# Patient Record
Sex: Female | Born: 1950
Health system: Southern US, Community
[De-identification: ages and names within clinical notes are randomized; demographics above are authoritative.]

## PROBLEM LIST (undated history)

## (undated) DIAGNOSIS — J449 Chronic obstructive pulmonary disease, unspecified: Secondary | ICD-10-CM

## (undated) DIAGNOSIS — E079 Disorder of thyroid, unspecified: Secondary | ICD-10-CM

## (undated) DIAGNOSIS — F419 Anxiety disorder, unspecified: Secondary | ICD-10-CM

## (undated) DIAGNOSIS — K5790 Diverticulosis of intestine, part unspecified, without perforation or abscess without bleeding: Secondary | ICD-10-CM

## (undated) DIAGNOSIS — N289 Disorder of kidney and ureter, unspecified: Secondary | ICD-10-CM

## (undated) DIAGNOSIS — E538 Deficiency of other specified B group vitamins: Secondary | ICD-10-CM

## (undated) DIAGNOSIS — A64 Unspecified sexually transmitted disease: Secondary | ICD-10-CM

## (undated) DIAGNOSIS — E039 Hypothyroidism, unspecified: Secondary | ICD-10-CM

## (undated) DIAGNOSIS — I5030 Unspecified diastolic (congestive) heart failure: Secondary | ICD-10-CM

## (undated) DIAGNOSIS — I10 Essential (primary) hypertension: Secondary | ICD-10-CM

## (undated) DIAGNOSIS — E785 Hyperlipidemia, unspecified: Secondary | ICD-10-CM

## (undated) DIAGNOSIS — N186 End stage renal disease: Secondary | ICD-10-CM

## (undated) DIAGNOSIS — I509 Heart failure, unspecified: Secondary | ICD-10-CM

## (undated) DIAGNOSIS — R011 Cardiac murmur, unspecified: Secondary | ICD-10-CM

## (undated) DIAGNOSIS — Z992 Dependence on renal dialysis: Secondary | ICD-10-CM

## (undated) DIAGNOSIS — K219 Gastro-esophageal reflux disease without esophagitis: Secondary | ICD-10-CM

## (undated) DIAGNOSIS — N891 Moderate vaginal dysplasia: Secondary | ICD-10-CM

## (undated) DIAGNOSIS — I729 Aneurysm of unspecified site: Secondary | ICD-10-CM

## (undated) DIAGNOSIS — Z9981 Dependence on supplemental oxygen: Secondary | ICD-10-CM

## (undated) DIAGNOSIS — L920 Granuloma annulare: Secondary | ICD-10-CM

## (undated) DIAGNOSIS — D649 Anemia, unspecified: Secondary | ICD-10-CM

## (undated) DIAGNOSIS — C801 Malignant (primary) neoplasm, unspecified: Secondary | ICD-10-CM

## (undated) HISTORY — DX: Gastro-esophageal reflux disease without esophagitis: K21.9

## (undated) HISTORY — DX: Essential (primary) hypertension: I10

## (undated) HISTORY — DX: Deficiency of other specified B group vitamins: E53.8

## (undated) HISTORY — DX: Disorder of thyroid, unspecified: E07.9

## (undated) HISTORY — DX: Aneurysm of unspecified site: I72.9

## (undated) HISTORY — PX: COLON SURGERY: SHX602

## (undated) HISTORY — DX: Moderate vaginal dysplasia: N89.1

## (undated) HISTORY — PX: OTHER SURGICAL HISTORY: SHX169

## (undated) HISTORY — DX: Unspecified sexually transmitted disease: A64

## (undated) HISTORY — DX: Hyperlipidemia, unspecified: E78.5

## (undated) HISTORY — PX: ABDOMINAL AORTIC ANEURYSM REPAIR: SUR1152

## (undated) HISTORY — DX: Malignant (primary) neoplasm, unspecified: C80.1

## (undated) HISTORY — PX: SKIN GRAFT: SHX250

## (undated) HISTORY — PX: TONSILLECTOMY: SUR1361

## (undated) HISTORY — DX: Granuloma annulare: L92.0

---
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
## 1984-03-30 HISTORY — PX: HYPOTHENAR FAT PAD TRANSFER: SHX6408

## 1988-03-30 HISTORY — PX: TOTAL ABDOMINAL HYSTERECTOMY: SHX209

## 1996-03-30 HISTORY — PX: BLADDER SURGERY: SHX569

## 1999-01-09 ENCOUNTER — Inpatient Hospital Stay (HOSPITAL_COMMUNITY): Admission: AD | Admit: 1999-01-09 | Discharge: 1999-01-13 | Payer: Self-pay | Admitting: *Deleted

## 1999-01-09 ENCOUNTER — Encounter (INDEPENDENT_AMBULATORY_CARE_PROVIDER_SITE_OTHER): Payer: Self-pay | Admitting: *Deleted

## 1999-06-12 ENCOUNTER — Encounter (INDEPENDENT_AMBULATORY_CARE_PROVIDER_SITE_OTHER): Payer: Self-pay | Admitting: Specialist

## 1999-06-12 ENCOUNTER — Ambulatory Visit (HOSPITAL_COMMUNITY): Admission: RE | Admit: 1999-06-12 | Discharge: 1999-06-12 | Payer: Self-pay | Admitting: Gastroenterology

## 2003-08-03 ENCOUNTER — Other Ambulatory Visit: Admission: RE | Admit: 2003-08-03 | Discharge: 2003-08-03 | Payer: Self-pay | Admitting: Family Medicine

## 2004-03-29 ENCOUNTER — Emergency Department: Payer: Self-pay | Admitting: Unknown Physician Specialty

## 2007-09-28 DIAGNOSIS — N891 Moderate vaginal dysplasia: Secondary | ICD-10-CM

## 2007-09-28 HISTORY — DX: Moderate vaginal dysplasia: N89.1

## 2007-10-05 ENCOUNTER — Ambulatory Visit: Payer: Self-pay | Admitting: Vascular Surgery

## 2008-03-30 DIAGNOSIS — L920 Granuloma annulare: Secondary | ICD-10-CM

## 2008-03-30 HISTORY — DX: Granuloma annulare: L92.0

## 2008-07-25 ENCOUNTER — Other Ambulatory Visit: Admission: RE | Admit: 2008-07-25 | Discharge: 2008-07-25 | Payer: Self-pay | Admitting: Obstetrics and Gynecology

## 2008-09-18 ENCOUNTER — Encounter: Admission: RE | Admit: 2008-09-18 | Discharge: 2008-09-18 | Payer: Self-pay | Admitting: Family Medicine

## 2009-11-14 ENCOUNTER — Encounter: Payer: Self-pay | Admitting: Family Medicine

## 2009-11-14 LAB — CONVERTED CEMR LAB: Pap Smear: NORMAL

## 2010-05-30 ENCOUNTER — Ambulatory Visit (INDEPENDENT_AMBULATORY_CARE_PROVIDER_SITE_OTHER): Payer: BC Managed Care – PPO | Admitting: Family Medicine

## 2010-05-30 ENCOUNTER — Encounter: Payer: Self-pay | Admitting: Family Medicine

## 2010-05-30 DIAGNOSIS — F17200 Nicotine dependence, unspecified, uncomplicated: Secondary | ICD-10-CM | POA: Insufficient documentation

## 2010-05-30 DIAGNOSIS — K219 Gastro-esophageal reflux disease without esophagitis: Secondary | ICD-10-CM | POA: Insufficient documentation

## 2010-05-30 DIAGNOSIS — I10 Essential (primary) hypertension: Secondary | ICD-10-CM

## 2010-05-30 DIAGNOSIS — E039 Hypothyroidism, unspecified: Secondary | ICD-10-CM | POA: Insufficient documentation

## 2010-05-30 DIAGNOSIS — E785 Hyperlipidemia, unspecified: Secondary | ICD-10-CM

## 2010-05-30 DIAGNOSIS — J449 Chronic obstructive pulmonary disease, unspecified: Secondary | ICD-10-CM | POA: Insufficient documentation

## 2010-05-30 DIAGNOSIS — I713 Abdominal aortic aneurysm, ruptured: Secondary | ICD-10-CM | POA: Insufficient documentation

## 2010-06-02 ENCOUNTER — Other Ambulatory Visit (INDEPENDENT_AMBULATORY_CARE_PROVIDER_SITE_OTHER): Payer: BC Managed Care – PPO

## 2010-06-02 ENCOUNTER — Other Ambulatory Visit: Payer: Self-pay | Admitting: Family Medicine

## 2010-06-02 ENCOUNTER — Encounter (INDEPENDENT_AMBULATORY_CARE_PROVIDER_SITE_OTHER): Payer: Self-pay | Admitting: *Deleted

## 2010-06-02 DIAGNOSIS — E785 Hyperlipidemia, unspecified: Secondary | ICD-10-CM

## 2010-06-02 DIAGNOSIS — I10 Essential (primary) hypertension: Secondary | ICD-10-CM

## 2010-06-02 DIAGNOSIS — E039 Hypothyroidism, unspecified: Secondary | ICD-10-CM

## 2010-06-02 LAB — BASIC METABOLIC PANEL
CO2: 27 mEq/L (ref 19–32)
GFR: 66.75 mL/min (ref >60.00)
Glucose, Bld: 84 mg/dL (ref 70–99)
Potassium: 4 mEq/L (ref 3.5–5.1)
Sodium: 138 mEq/L (ref 135–145)

## 2010-06-02 LAB — LIPID PANEL
Total CHOL/HDL Ratio: 5
VLDL: 32 mg/dL (ref 0.0–40.0)

## 2010-06-05 NOTE — Assessment & Plan Note (Signed)
Summary: NEW PT TO EST/CLE   [employer]   Vital Signs:  Patient profile:   60 year old female Height:      64 inches Weight:      141.75 pounds BMI:     24.42 Temp:     98.4 degrees F oral Pulse rate:   73 / minute Pulse rhythm:   regular BP sitting:   140 / 90  (right arm) Cuff size:   regular  Vitals Entered By: Sherrian Divers CMA Deborra Medina) (May 30, 2010 2:03 PM) CC: new patient, establish care   History of Present Illness: 60 yo here to establish care with.  GERD-followed by Dr. Collene Mares.  aware that her cigarette smoking is making it worse.  symtpoms are the worst when she lays down at night.  tums helps a little bit, never tried anything stronger.  no nausea, vomiting, abdominal pain or changes in her stool.  HLD- Had been well controlled on Vytorin but could not afford it.  Has been on Lipitor 40 mg daily for approx 60 days.  Also taking Tricor 160 mg daily.   Has not had labs rechecked since Lipitor was added.  HTN- has been well controlled on Lotrel 5-10 mg and Meprolol 25 mg daily.  BP at last OV at previous MD was 128/70.  Pt states she was nervous about today's visit.  No CP, SOB or LE edema.  Hypothyroidism- was overcorrected and Synthroid was decreased to 100 micrograms in 01/2010.  Has not had follow up labs ince.  Tobacco abuse- 40 pack year history.  Tried Chantix in past, just was not ready.  Not currently ready to quit.  Has a h/o AAA and knows that she needs to quit.  Preventive Screening-Counseling & Management  Alcohol-Tobacco     Smoking Status: current      Drug Use:  no.    Current Medications (verified): 1)  Levothroid 100 Mcg Tabs (Levothyroxine Sodium) .... Take One Tablet By Mouth Daily 2)  Amlodipine Besy-Benazepril Hcl 5-10 Mg Caps (Amlodipine Besy-Benazepril Hcl) .... Take One Tablet By Mouth Daily 3)  Vitamin D 1000 Unit Tabs (Cholecalciferol) .... Take Four Tablets By Mouth Daily 4)  Metoprolol Tartrate 25 Mg Tabs (Metoprolol Tartrate) .... Take One  Tablet By Mouth Daily 5)  Fenofibrate 160 Mg Tabs (Fenofibrate) .... Take One Tablet By Mouth Daily 6)  Atorvastatin Calcium 40 Mg Tabs (Atorvastatin Calcium) .... Take One Tablet By Mouth Daily 7)  Estrace 0.1 Mg/gm Crea (Estradiol) .... 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 Joan Flores Cardiology: PVD managed by Dr. Rollene Fare, Dr. Kellie Simmering Gastroenterology: Dr. 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 Eyes:  Denies blurring. ENT:  Denies difficulty swallowing. CV:  Denies chest pain or discomfort. Resp:  Denies shortness of breath. GI:  Complains of indigestion; denies abdominal pain and  change in bowel habits. GU:  Denies dysuria. MS:  Denies joint pain, joint redness, and joint swelling. Derm:  Denies rash. Neuro:  Denies headaches. Psych:  Denies anxiety and depression. Endo:  Denies cold intolerance and heat intolerance. Heme:  Denies abnormal bruising and bleeding.  Physical Exam  General:  alert, well-developed, and well-hydrated.   Head:  Normocephalic and atraumatic without obvious abnormalities. No apparent alopecia or balding. Eyes:  No corneal or conjunctival inflammation noted. EOMI. Perrla. Funduscopic exam  benign, without hemorrhages, exudates or papilledema. Vision grossly normal. Ears:  External ear exam shows no significant lesions or deformities.  Otoscopic examination reveals clear canals, tympanic membranes are intact bilaterally without bulging, retraction, inflammation or discharge. Hearing is grossly normal bilaterally. Nose:  External nasal examination shows no deformity or inflammation. Nasal mucosa are pink and moist without lesions or exudates. Mouth:  Oral mucosa and oropharynx without lesions or exudates.  Teeth in good repair. Neck:  No deformities, masses, or tenderness noted. Lungs:  Normal respiratory effort, chest expands symmetrically. Lungs are clear to auscultation, no crackles or wheezes. Heart:  Normal rate and regular rhythm. S1 and S2 normal without gallop, murmur, click, rub or other extra sounds. Abdomen:  Bowel sounds positive,abdomen soft and non-tender without masses, organomegaly or hernias noted. Msk:  No deformity or scoliosis noted of thoracic or lumbar spine.   Extremities:  no edema Neurologic:  alert & oriented X3 and gait normal.   Skin:  Intact without suspicious lesions or rashes Cervical Nodes:  No lymphadenopathy noted Psych:  Cognition and judgment appear intact. Alert and cooperative with normal attention span and concentration. No apparent delusions, illusions, hallucinations   Impression & Recommendations:  Problem # 1:  HYPOTHYROIDISM (ICD-244.9) Assessment Unchanged recheck TSH,  Continue current dose of Levothroid for now, asymptomatic. Her updated medication list for this problem includes:    Levothroid 100 Mcg Tabs (Levothyroxine sodium) .Marland Kitchen... Take one tablet by mouth daily  Problem # 2:  GERD (ICD-530.81) Assessment: Deteriorated advised trying Prilosec 20 mg OTC. Pt will update me or Dr. Collene Mares with her symptoms in 1-2 months, sooner if symtpoms progress.  Problem # 3:  HYPERTENSION (ICD-401.9) Assessment: Deteriorated likely white  coat HTN today.  continue current medications and we will continue to monitor. Her updated medication list for this problem includes:    Amlodipine Besy-benazepril Hcl 5-10 Mg Caps (Amlodipine besy-benazepril hcl) .Marland Kitchen... Take one tablet by mouth daily    Metoprolol Tartrate 25 Mg Tabs (Metoprolol tartrate) .Marland Kitchen... Take one tablet by mouth daily  Problem # 4:  HYPERLIPIDEMIA (ICD-272.4) Assessment: Unchanged recently started lipitor (60 days ago), will recheck FLP, hepatic panel when she is fasting. Her updated medication list for this problem includes:    Fenofibrate 160 Mg Tabs (Fenofibrate) .Marland Kitchen... Take one tablet by mouth daily    Atorvastatin Calcium 40 Mg Tabs (Atorvastatin calcium) .Marland Kitchen... Take one tablet by mouth daily  Problem # 5:  TOBACCO ABUSE (ICD-305.1) Assessment: Unchanged Discussed importance of smoking cessation but pt is not yet ready to quit.  She will let me know when she is.  At that point, she would like to try Chantix again.  Complete Medication List: 1)  Levothroid 100 Mcg Tabs (Levothyroxine sodium) .... Take one tablet by mouth daily 2)  Amlodipine Besy-benazepril Hcl 5-10 Mg Caps (Amlodipine besy-benazepril hcl) .... Take one tablet by mouth daily 3)  Vitamin D 1000 Unit Tabs (Cholecalciferol) .... Take four tablets by mouth daily 4)  Metoprolol Tartrate 25 Mg  Tabs (Metoprolol tartrate) .... Take one tablet by mouth daily 5)  Fenofibrate 160 Mg Tabs (Fenofibrate) .... Take one tablet by mouth daily 6)  Atorvastatin Calcium 40 Mg Tabs (Atorvastatin calcium) .... Take one tablet by mouth daily 7)  Estrace 0.1 Mg/gm Crea (Estradiol) .... Apply twice weekly  Patient Instructions: 1)  Great to meet you. 2)  Please make an appointment for a fasting lab visit for lipid panel, BMET, TSH, liver function- 272.4, 401.9 Prescriptions: LEVOTHROID 100 MCG TABS (LEVOTHYROXINE SODIUM) take one tablet by mouth daily  #90 x 3   Entered and Authorized by:   Arnette Norris MD   Signed by:    Arnette Norris MD on 05/30/2010   Method used:   Electronically to        [facility]. [phone]* (retail)       [place], [place]  [id number]       Ph: [phone] or [phone]       Fax: [phone]   RxID:   [id number]    Orders Added: 1)  New Patient Level III [phone]    Current Allergies (reviewed today): No known allergies   TD Result Date:  09/06/2008 TD Result:  given TD Next Due:  75 yr Flex Sig Next Due:  Not Indicated Colonoscopy Result Date:  11/13/2009 Colonoscopy Result:  normal Hemoccult Next Due:  Not Indicated PAP Result Date:  11/14/2009 PAP Result:  historical-normal Mammogram Result Date:  11/19/2009 Mammogram Result:  normal    Past Medical History:    Hyperlipidemia    Hypertension    Hypothyroidism    GERD  Past Surgical History:    Hysterectomy    Tonsillectomy

## 2010-06-10 NOTE — Letter (Signed)
Summary: Patient medical records  Patient medical records   Imported By: Jamelle Haring 06/05/2010 11:35:11  _____________________________________________________________________  External Attachment:    Type:   Image     Comment:   External Document

## 2010-07-08 ENCOUNTER — Encounter: Payer: Self-pay | Admitting: Family Medicine

## 2010-07-08 LAB — HM MAMMOGRAPHY

## 2010-07-08 LAB — HM PAP SMEAR

## 2010-07-10 ENCOUNTER — Other Ambulatory Visit: Payer: Self-pay | Admitting: Family Medicine

## 2010-07-10 ENCOUNTER — Encounter: Payer: Self-pay | Admitting: Family Medicine

## 2010-07-10 ENCOUNTER — Ambulatory Visit (INDEPENDENT_AMBULATORY_CARE_PROVIDER_SITE_OTHER): Payer: BC Managed Care – PPO | Admitting: Family Medicine

## 2010-07-10 VITALS — BP 132/80 | HR 60 | Temp 98.0°F | Ht 64.0 in | Wt 139.4 lb

## 2010-07-10 DIAGNOSIS — K219 Gastro-esophageal reflux disease without esophagitis: Secondary | ICD-10-CM

## 2010-07-10 MED ORDER — DEXLANSOPRAZOLE 60 MG PO CPDR: 60.0000 mg | DELAYED_RELEASE_CAPSULE | Freq: Every day | ORAL | Status: DC

## 2010-07-10 NOTE — Patient Instructions (Signed)
Gastroesophageal Reflux Disease (GERD) Your caregiver has diagnosed your chest discomfort as caused by gastroesophageal reflux disease (GERD). GERD is caused by a reflux of acid from your stomach into the digestive tube between your mouth and stomach (esophagus). Acid in contact with the esophagus causes soreness (inflammation) resulting in heartburn or chest pain. It may cause small holes in the lining of the esophagus (ulcers). CAUSES  Increased body weight puts pressure on the stomach, making acid rise.   Smoking increases acid production.   Alcohol decreases pressure on the valve between the stomach and esophagus (lower esophageal sphincter), allowing acid from the stomach into the esophagus.   Late evening meals and a full stomach increase pressure and acid production.   Lower esophageal sphincter is malformed.   Sometimes, no reason is found.  HOME CARE INSTRUCTIONS  Change the factors that you can control. Weight, smoking, or alcohol changes may be difficult to change on your own. Your caregiver can provide guidance and medical therapy.   Raising the head of your bed may help you to sleep.   Over-the-counter medicines will decrease acid production. Your caregiver can also prescribe medicines for this. Only take over-the-counter or prescription medicines for pain, discomfort, or fever as directed by your caregiver.   1/2 to 1 teaspoon of an antacid taken every hour while awake, with meals, and at bedtime, can neutralize acid.   DO NOT take aspirin, ibuprofen, or other nonsteroidal anti-inflammatory drugs (NSAIDs).  SEEK IMMEDIATE MEDICAL CARE IF:  The pain changes in location (radiates into arms, neck, jaw, teeth, or back), intensity, or duration.   You start feeling sick to your stomach (nauseous), start throwing up (vomiting), or sweating (diaphoresis).   You develop left arm or jaw pain.   You develop pain going into your back, shortness of breath, or you pass out.    There is vomiting of fluid that is green, yellow, or looks like coffee grounds or blood.  These symptoms could signal other problems, such as heart disease. MAKE SURE YOU:  Understand these instructions.   Will watch your condition.   Will get help right away if you are not doing well or get worse.  Document Released: 12/24/2004 Document Re-Released: 06/10/2009 [facility] Patient Information 2011 [place].

## 2010-07-10 NOTE — Progress Notes (Signed)
60 yo here for follow up GERD.  GERD-followed by Dr. Collene Mares.  aware that her cigarette smoking is making it worse.  symtpoms are the worst when she lays down at night.  tums helps a little bit, never tried anything stronger.  no nausea, vomiting, abdominal pain or changes in her stool.  Added Prilosec 20 mg daily last month which did not help much so she started taking it twice a day with a little more improvement of symptoms.  Does not have sensation that food or liquids are getting stuck.  The PMH, PSH, Social History, Family History, Medications, and allergies have been reviewed in [facility], and have been updated if relevant.   Review of Systems       See HPI General:  Denies fatigue and fever. Eyes:  Denies blurring. ENT:  Denies difficulty swallowing. CV:  Denies chest pain or discomfort. Resp:  Denies shortness of breath. GI:  Complains of indigestion; denies abdominal pain and change in bowel habits. GU:  Denies dysuria. MS:  Denies joint pain, joint redness, and joint swelling. Derm:  Denies rash. Neuro:  Denies headaches. Psych:  Denies anxiety and depression. Endo:  Denies cold intolerance and heat intolerance. Heme:  Denies abnormal bruising and bleeding.  Physical Exam BP 132/80  Pulse 60  Temp(Src) 98 F (36.7 C) (Oral)  Ht 5\' 4"  (1.626 m)  Wt 139 lb 6.4 oz (63.231 kg)  BMI 23.93 kg/m2  General:  alert, well-developed, and well-hydrated.   Head:  Normocephalic and atraumatic without obvious abnormalities. No apparent alopecia or balding. Eyes:  No corneal or conjunctival inflammation noted. EOMI. Perrla. Funduscopic exam benign, without hemorrhages, exudates or papilledema. Vision grossly normal. Ears:  External ear exam shows no significant lesions or deformities.  Otoscopic examination reveals clear canals, tympanic membranes are intact bilaterally without bulging, retraction, inflammation or discharge. Hearing is grossly normal bilaterally. Nose:  External nasal  examination shows no deformity or inflammation. Nasal mucosa are pink and moist without lesions or exudates. Mouth:  Oral mucosa and oropharynx without lesions or exudates.  Teeth in good repair. Neck:  No deformities, masses, or tenderness noted. Lungs:  Normal respiratory effort, chest expands symmetrically. Lungs are clear to auscultation, no crackles or wheezes. Heart:  Normal rate and regular rhythm. S1 and S2 normal without gallop, murmur, click, rub or other extra sounds. Abdomen:  Bowel sounds positive,abdomen soft and non-tender without masses, organomegaly or hernias noted. Msk:  No deformity or scoliosis noted of thoracic or lumbar spine.   Extremities:  no edema Neurologic:  alert & oriented X3 and gait normal.   Skin:  Intact without suspicious lesions or rashes Cervical Nodes:  No lymphadenopathy noted Psych:  Cognition and judgment appear intact. Alert and cooperative with normal attention span and concentration. No apparent delusions, illusions, hallucinations

## 2010-07-10 NOTE — Assessment & Plan Note (Signed)
Deteriorated. Given samples of Dexilant to try also advised to make appointment with Dr. Collene Mares for evaluation/possible endoscopy given smoking history. No red flag symptoms currently.

## 2010-07-16 ENCOUNTER — Other Ambulatory Visit: Payer: Self-pay | Admitting: *Deleted

## 2010-07-16 MED ORDER — FENOFIBRATE 160 MG PO TABS: 160.0000 mg | ORAL_TABLET | Freq: Every day | ORAL | Status: DC

## 2010-07-29 ENCOUNTER — Other Ambulatory Visit (INDEPENDENT_AMBULATORY_CARE_PROVIDER_SITE_OTHER): Payer: BC Managed Care – PPO | Admitting: Family Medicine

## 2010-07-29 ENCOUNTER — Other Ambulatory Visit: Payer: Self-pay | Admitting: Family Medicine

## 2010-07-29 DIAGNOSIS — I10 Essential (primary) hypertension: Secondary | ICD-10-CM

## 2010-07-29 DIAGNOSIS — E785 Hyperlipidemia, unspecified: Secondary | ICD-10-CM

## 2010-07-29 DIAGNOSIS — E039 Hypothyroidism, unspecified: Secondary | ICD-10-CM

## 2010-07-29 LAB — HEPATIC FUNCTION PANEL
AST: 25 U/L (ref 0–37)
Albumin: 4 g/dL (ref 3.5–5.2)
Alkaline Phosphatase: 43 U/L (ref 39–117)
Bilirubin, Direct: 0.2 mg/dL (ref 0.0–0.3)
Total Bilirubin: 0.9 mg/dL (ref 0.3–1.2)

## 2010-07-29 LAB — TSH: TSH: 3.27 u[IU]/mL (ref 0.35–5.50)

## 2010-07-29 LAB — BASIC METABOLIC PANEL
BUN: 15 mg/dL (ref 6–23)
Chloride: 107 mEq/L (ref 96–112)
GFR: 56.56 mL/min — ABNORMAL LOW (ref >60.00)
Potassium: 4.9 mEq/L (ref 3.5–5.1)
Sodium: 143 mEq/L (ref 135–145)

## 2010-07-29 LAB — LIPID PANEL
Cholesterol: 178 mg/dL (ref 0–200)
LDL Cholesterol: 111 mg/dL — ABNORMAL HIGH (ref 0–99)

## 2010-08-11 ENCOUNTER — Other Ambulatory Visit: Payer: Self-pay | Admitting: Family Medicine

## 2010-08-12 NOTE — Procedures (Signed)
LOWER EXTREMITY ARTERIAL EVALUATION-SINGLE LEVEL   INDICATION:  Followup of known peripheral vascular disease.  The patient  denies claudication and rest pain bilaterally.   HISTORY:  Diabetes:  No.  Cardiac:  No.  Hypertension:  Yes.  Smoking:  Yes, <1 pack per day.  Previous Surgery:  Left lower extremity arterial surgery in 1987.   RESTING SYSTOLIC PRESSURES: (ABI)                          RIGHT                LEFT  Brachial:               130                  136  Anterior tibial:        120                  124  Posterior tibial:       129 (0.95)           128 (0.94)  Peroneal:  DOPPLER WAVEFORM ANALYSIS:  Anterior tibial:        Biphasic             Biphasic  Posterior tibial:       Biphasic             Biphasic  Peroneal:   PREVIOUS ABI'S:  Date: 09/16/2004  RIGHT:  0.84  LEFT:  0.84   IMPRESSION:  1. Mild increase in ABIs bilaterally.  2. Normal right ABI.  3. Near normal left ABI.  4. Preliminary report faxed to Dr. Vincente Poli office at 11:30 on      10/05/2007.      ___________________________________________  Rosetta Posner, M.D.   PB/MEDQ  D:  10/05/2007  T:  10/05/2007  Job:  [id number]

## 2010-08-15 NOTE — Procedures (Signed)
[place]  Patient:    [name], [name]                    MRN: [id number] Proc. Date: 06/12/99 Adm. Date:  [id number] Attending:  Juanita Craver CC:         Sharene Butters, M.D.                           Procedure Report  DATE OF BIRTH:  1950-09-10.  REFERRING PHYSICIAN:  Sharene Butters, M.D.  PROCEDURE PERFORMED:  Flexible sigmoidoscopy.  ENDOSCOPIST:  Nelwyn Salisbury, M.D.  INSTRUMENT USED:  Olympus video colonoscope.  INDICATION FOR PROCEDURE:  Screening flexible sigmoidoscopy is being performed n a 60 year old white female; rule out masses, polyps, etc.  PREPROCEDURE PREPARATION:  Informed consent was procured from the patient.  The  patient was fasted for eight hours prior to the procedure and prepped with two [employer] the morning of the procedure.  PREPROCEDURE PHYSICAL:  VITAL SIGNS:  Patient had stable vital signs.  NECK:  Supple.  CHEST:  Clear to auscultation.  S1 and S2 regular.  ABDOMEN:  Soft with normal abdominal bowel sounds.  DESCRIPTION OF PROCEDURE:  The patient was placed in the left lateral decubitus  position.  No sedation was used.  Once the patient was adequately positioned, Olympus video colonoscope was advanced from the rectum to 50 cm with slight difficulty secondary to a large amount of solid stool at 50 cm.  There was early left-sided diverticular disease with some inspissated stool in several of the diverticular pockets.  There was a patchy area of erythema measuring about 2 to 3 mm at 50 cm, of unclear etiology; this was biopsied for pathology.  Small internal and external hemorrhoids were seen.  The patient tolerated the procedure well without complication.  No masses or polyps were present.  IMPRESSION: 1. Early left-sided diverticular disease with stool in some of the diverticular    pockets. 2. Patchy area of erythema at 50 cm, 2 to 3 mm in size, biopsied for pathology.  3.  Small internal and external hemorrhoids.  RECOMMENDATIONS: 1. Await pathology results. 2. Increase fluid and fiber in the diet. 3. Outpatient followup in the next 7 to 10 days. DD:  06/12/99 TD:  06/13/99 Job: [id number]

## 2010-08-21 ENCOUNTER — Ambulatory Visit (INDEPENDENT_AMBULATORY_CARE_PROVIDER_SITE_OTHER): Payer: BC Managed Care – PPO | Admitting: Family Medicine

## 2010-08-21 ENCOUNTER — Encounter: Payer: Self-pay | Admitting: Family Medicine

## 2010-08-21 DIAGNOSIS — M5430 Sciatica, unspecified side: Secondary | ICD-10-CM

## 2010-08-21 DIAGNOSIS — M5440 Lumbago with sciatica, unspecified side: Secondary | ICD-10-CM

## 2010-08-21 MED ORDER — CYCLOBENZAPRINE HCL 10 MG PO TABS: 10.0000 mg | ORAL_TABLET | Freq: Three times a day (TID) | ORAL | Status: AC | PRN

## 2010-08-21 NOTE — Progress Notes (Signed)
  Subjective:    Patient ID: [name], female    DOB: 03-Nov-1950, 60 y.o.   MRN: [id number]  HPI Pt of Dr Hulen Shouts here as acute appt for leg pain yesterday worse yesterday and so bad  that she would have cut off her leg then if it would have been possible. Sat she was washing her truck and turned the wrong way and felt a shooting pain  in the low back. The next day she had shooting pain down the left leg and yesterday it was the worst it had been. She has had LBP before but never down the leg. She has had no swelling or erythema but had been concerned about a clot. She has been able to walk without difficulty and slept last nite although she had to often get up to walk around to help with the discomfort. She has not taken any medication. She did ice the area some.     Review of SystemsNoncontributory except as above.       Objective:   Physical ExamWDWN WF NAD  Changing position regularly and "sitting on one cheek" when sitting. Flexion at the waist to the floor w/o diff or pain. Ext nml. Land R lat bend nml and w/o pain. Twist causes slight twinge at approx 30 degrees bilat. DTRs nml patellar. SLR nml, no discomfort.        Assessment & Plan:

## 2010-08-21 NOTE — Assessment & Plan Note (Signed)
Discussed pathophysiology and trmt. See instructions. Call or RTC if sxs don't slowly start to improve.

## 2010-08-21 NOTE — Patient Instructions (Signed)
Take IBP 800mg  (4x200mg ) after btrfst lunch and supoper. Take FLexeril 10mg  with each dose of IBP. Use heat and ice as discussed. Start gentle stretches next week.

## 2010-08-29 ENCOUNTER — Other Ambulatory Visit: Payer: Self-pay | Admitting: Family Medicine

## 2010-09-28 ENCOUNTER — Other Ambulatory Visit: Payer: Self-pay | Admitting: Family Medicine

## 2010-09-30 ENCOUNTER — Telehealth: Payer: Self-pay | Admitting: *Deleted

## 2010-09-30 MED ORDER — OMEPRAZOLE 40 MG PO CPDR: 40.0000 mg | DELAYED_RELEASE_CAPSULE | Freq: Every day | ORAL | Status: DC

## 2010-09-30 NOTE — Telephone Encounter (Signed)
Bridgett notified as instructed via telephone, Rx Omeprazole sent electronically.

## 2010-09-30 NOTE — Telephone Encounter (Signed)
Generic omeprazole sent to her pharmacy.

## 2010-09-30 NOTE — Telephone Encounter (Signed)
Bridgett from [facility] called to let you know that the newium is going to be $190.00 w/ patients insurance and is asking if there is something else that she can try. Please advise.

## 2010-12-29 ENCOUNTER — Other Ambulatory Visit: Payer: Self-pay | Admitting: *Deleted

## 2010-12-29 MED ORDER — LEVOTHYROXINE SODIUM 75 MCG PO TABS: 75.0000 ug | ORAL_TABLET | Freq: Every day | ORAL | Status: DC

## 2010-12-29 MED ORDER — METOPROLOL TARTRATE 25 MG PO TABS: 25.0000 mg | ORAL_TABLET | Freq: Every day | ORAL | Status: DC

## 2011-02-05 ENCOUNTER — Other Ambulatory Visit: Payer: Self-pay | Admitting: Family Medicine

## 2011-02-06 NOTE — Telephone Encounter (Signed)
Rx refilled electronically °

## 2011-03-08 ENCOUNTER — Other Ambulatory Visit: Payer: Self-pay | Admitting: Family Medicine

## 2011-03-27 ENCOUNTER — Other Ambulatory Visit: Payer: Self-pay | Admitting: Family Medicine

## 2011-04-07 ENCOUNTER — Encounter: Payer: Self-pay | Admitting: Family Medicine

## 2011-04-08 ENCOUNTER — Other Ambulatory Visit: Payer: Self-pay | Admitting: Family Medicine

## 2011-04-08 DIAGNOSIS — R928 Other abnormal and inconclusive findings on diagnostic imaging of breast: Secondary | ICD-10-CM

## 2011-07-17 ENCOUNTER — Other Ambulatory Visit: Payer: Self-pay | Admitting: Family Medicine

## 2011-07-17 MED ORDER — LEVOTHYROXINE SODIUM 75 MCG PO TABS: 75.0000 ug | ORAL_TABLET | Freq: Every day | ORAL | Status: DC

## 2011-08-07 ENCOUNTER — Other Ambulatory Visit: Payer: Self-pay | Admitting: *Deleted

## 2011-08-07 MED ORDER — ATORVASTATIN CALCIUM 40 MG PO TABS: 40.0000 mg | ORAL_TABLET | Freq: Every day | ORAL | Status: DC

## 2011-08-07 MED ORDER — FENOFIBRATE 160 MG PO TABS: 160.0000 mg | ORAL_TABLET | Freq: Every day | ORAL | Status: DC

## 2011-08-07 MED ORDER — LEVOTHYROXINE SODIUM 75 MCG PO TABS: 75.0000 ug | ORAL_TABLET | Freq: Every day | ORAL | Status: DC

## 2011-08-07 MED ORDER — AMLODIPINE BESY-BENAZEPRIL HCL 5-10 MG PO CAPS: 1.0000 | ORAL_CAPSULE | Freq: Every day | ORAL | Status: DC

## 2011-08-07 MED ORDER — METOPROLOL TARTRATE 25 MG PO TABS: 25.0000 mg | ORAL_TABLET | Freq: Two times a day (BID) | ORAL | Status: DC

## 2011-08-07 NOTE — Telephone Encounter (Signed)
Ok to refill one month only of each.  Please call pt to make sure she schedules a physical exam. Thank you.

## 2011-08-07 NOTE — Telephone Encounter (Signed)
Patient has not been in since May 2012.  Has no appt scheduled.  A note was sent to pharmacy at last refill request of 2 medications.  Please advise.

## 2011-08-25 ENCOUNTER — Other Ambulatory Visit: Payer: Self-pay | Admitting: Family Medicine

## 2011-09-01 ENCOUNTER — Other Ambulatory Visit: Payer: Self-pay | Admitting: Family Medicine

## 2011-09-01 DIAGNOSIS — E039 Hypothyroidism, unspecified: Secondary | ICD-10-CM

## 2011-09-01 DIAGNOSIS — E785 Hyperlipidemia, unspecified: Secondary | ICD-10-CM

## 2011-09-01 DIAGNOSIS — I10 Essential (primary) hypertension: Secondary | ICD-10-CM

## 2011-09-09 ENCOUNTER — Other Ambulatory Visit (INDEPENDENT_AMBULATORY_CARE_PROVIDER_SITE_OTHER): Payer: BC Managed Care – PPO

## 2011-09-09 DIAGNOSIS — E785 Hyperlipidemia, unspecified: Secondary | ICD-10-CM

## 2011-09-09 DIAGNOSIS — E039 Hypothyroidism, unspecified: Secondary | ICD-10-CM

## 2011-09-09 DIAGNOSIS — I10 Essential (primary) hypertension: Secondary | ICD-10-CM

## 2011-09-09 LAB — COMPREHENSIVE METABOLIC PANEL
ALT: 73 U/L — ABNORMAL HIGH (ref 0–35)
AST: 73 U/L — ABNORMAL HIGH (ref 0–37)
Albumin: 4.1 g/dL (ref 3.5–5.2)
BUN: 16 mg/dL (ref 6–23)
CO2: 28 mEq/L (ref 19–32)
Calcium: 9.5 mg/dL (ref 8.4–10.5)
Chloride: 105 mEq/L (ref 96–112)
Creatinine, Ser: 1 mg/dL (ref 0.4–1.2)
GFR: 58.94 mL/min — ABNORMAL LOW (ref >60.00)
Potassium: 4.6 mEq/L (ref 3.5–5.1)

## 2011-09-09 LAB — LIPID PANEL: Total CHOL/HDL Ratio: 4

## 2011-09-09 LAB — TSH: TSH: 7.71 u[IU]/mL — ABNORMAL HIGH (ref 0.35–5.50)

## 2011-09-14 ENCOUNTER — Ambulatory Visit (INDEPENDENT_AMBULATORY_CARE_PROVIDER_SITE_OTHER): Payer: BC Managed Care – PPO | Admitting: Family Medicine

## 2011-09-14 ENCOUNTER — Other Ambulatory Visit: Payer: Self-pay | Admitting: Family Medicine

## 2011-09-14 ENCOUNTER — Encounter: Payer: Self-pay | Admitting: Family Medicine

## 2011-09-14 VITALS — BP 140/70 | HR 68 | Temp 97.8°F | Ht 64.0 in | Wt 143.5 lb

## 2011-09-14 DIAGNOSIS — Z0000 Encounter for general adult medical examination without abnormal findings: Secondary | ICD-10-CM

## 2011-09-14 DIAGNOSIS — E039 Hypothyroidism, unspecified: Secondary | ICD-10-CM

## 2011-09-14 DIAGNOSIS — M5440 Lumbago with sciatica, unspecified side: Secondary | ICD-10-CM

## 2011-09-14 DIAGNOSIS — R7989 Other specified abnormal findings of blood chemistry: Secondary | ICD-10-CM

## 2011-09-14 DIAGNOSIS — E785 Hyperlipidemia, unspecified: Secondary | ICD-10-CM

## 2011-09-14 DIAGNOSIS — M5430 Sciatica, unspecified side: Secondary | ICD-10-CM

## 2011-09-14 DIAGNOSIS — I10 Essential (primary) hypertension: Secondary | ICD-10-CM

## 2011-09-14 MED ORDER — LEVOTHYROXINE SODIUM 100 MCG PO TABS: 75.0000 ug | ORAL_TABLET | Freq: Every day | ORAL | Status: DC

## 2011-09-14 MED ORDER — ATORVASTATIN CALCIUM 40 MG PO TABS: 20.0000 mg | ORAL_TABLET | Freq: Every day | ORAL | Status: DC

## 2011-09-14 NOTE — Telephone Encounter (Signed)
Received refill request from pharmacy. Refill request does not match med sheet which shows Levothyroxine 100 mcg. Please advise.

## 2011-09-14 NOTE — Progress Notes (Signed)
61 yo here for CPX.  Sees GYN. S/p hysterectomy in 1990s- DUB.  Elevated LFTs-  She does not drink alcohol.  Does take Goodies powder and tylenol for tension headaches.  Mother is a nursing home and she takes a lot of the burden in caring for her. Denies any abdominal pain. Lab Results  Component Value Date   ALT 73* 09/09/2011   AST 73* 09/09/2011   ALKPHOS 58 09/09/2011   BILITOT 0.7 09/09/2011     GERD-followed by Dr. Collene Mares. aware that her cigarette smoking is making it worse. symtpoms are the worst when she lays down at night. tums helps a little bit, never tried anything stronger. no nausea, vomiting, abdominal pain or changes in her stool.   HLD-  Has been on Lipitor 40 mg daily and Tricor 160 mg daily.  Lab Results  Component Value Date   CHOL 147 09/09/2011   HDL 32.80* 09/09/2011   LDLCALC 85 09/09/2011   TRIG 146.0 09/09/2011   CHOLHDL 4 09/09/2011     HTN- has been well controlled on Lotrel 5-10 mg and Meprolol 25 mg daily. No CP, SOB or LE edema.   Hypothyroidism- on Synthroid  78 micrograms daily. FT4 0.98. Feels very fatigued, getting progressively worse over past 3-6 months. Some mild constipations. No heat or cold intolerance. No changes in skin or hair. Lab Results  Component Value Date   TSH 7.71* 09/09/2011    Tobacco abuse- 40 pack year history. Tried Chantix in past, just was not ready. Not currently ready to quit. Has a h/o AAA and knows that she needs to quit.   Patient Active Problem List  Diagnosis  . HYPOTHYROIDISM  . HYPERLIPIDEMIA  . TOBACCO ABUSE  . HYPERTENSION  . ABDOMINAL AORTIC ANEURYSM  . COPD  . GERD  . Low back pain with sciatica  . Abnormal mammogram  . Routine general medical examination at a health care facility   Past Medical History  Diagnosis Date  . Hyperlipidemia   . Hypertension   . Thyroid disease   . GERD (gastroesophageal reflux disease)    Past Surgical History  Procedure Date  . Vaginal hysterectomy   .  Tonsillectomy    History  Substance Use Topics  . Smoking status: Current Everyday Smoker -- 0.5 packs/day for 45 years  . Smokeless tobacco: Not on file  . Alcohol Use: No   Family History  Problem Relation Age of Onset  . Hypertension Other    No Known Allergies Current Outpatient Prescriptions on File Prior to Visit  Medication Sig Dispense Refill  . amLODipine-benazepril (LOTREL) 5-10 MG per capsule Take 1 capsule by mouth daily.  30 capsule  0  . atorvastatin (LIPITOR) 40 MG tablet Take 1 tablet (40 mg total) by mouth daily.  30 tablet  0  . cholecalciferol (VITAMIN D) 1000 UNITS tablet Take 1,000 Units by mouth 4 (four) times daily.        Marland Kitchen DEXILANT 60 MG capsule TAKE ONE CAPSULE EVERY DAY  30 capsule  6  . dexlansoprazole (DEXILANT) 60 MG capsule Take 60 mg by mouth daily.        Marland Kitchen estradiol (ESTRACE) 0.1 MG/GM vaginal cream Apply twice a week       . fenofibrate 160 MG tablet Take 1 tablet (160 mg total) by mouth daily.  30 tablet  0  . fenofibrate 160 MG tablet TAKE 1 TABLET (160 MG TOTAL) BY MOUTH DAILY.  30 tablet  0  . levothyroxine (  SYNTHROID, LEVOTHROID) 75 MCG tablet Take 1 tablet (75 mcg total) by mouth daily.  30 tablet  0  . metoprolol tartrate (LOPRESSOR) 25 MG tablet Take 1 tablet (25 mg total) by mouth 2 (two) times daily.  60 tablet  0  . omeprazole (PRILOSEC) 40 MG capsule Take 1 capsule (40 mg total) by mouth daily.  30 capsule  3     Review of Systems  See HPI Patient reports no  vision/ hearing changes,anorexia, weight change, fever ,adenopathy, persistant / recurrent hoarseness, swallowing issues, chest pain, edema,persistant / recurrent cough, hemoptysis, dyspnea(rest, exertional, paroxysmal nocturnal), gastrointestinal  bleeding (melena, rectal bleeding), abdominal pain, excessive heart burn, GU symptoms(dysuria, hematuria, pyuria, voiding/incontinence  Issues) syncope, focal weakness, severe memory loss, concerning skin lesions, depression, anxiety,  abnormal bruising/bleeding, major joint swelling, breast masses or abnormal vaginal bleeding.     Physical Exam  BP 140/70  Pulse 68  Temp 97.8 F (36.6 C) (Oral)  Ht 5\' 4"  (1.626 m)  Wt 143 lb 8 oz (65.091 kg)  BMI 24.63 kg/m2  General:  Well-developed,well-nourished,in no acute distress; alert,appropriate and cooperative throughout examination Head:  normocephalic and atraumatic.   Eyes:  vision grossly intact, pupils equal, pupils round, and pupils reactive to light.   Ears:  R ear normal and L ear normal.   Nose:  no external deformity.   Mouth:  good dentition.   Neck:  No deformities, masses, or tenderness noted. Lungs:  Normal respiratory effort, chest expands symmetrically. Lungs are clear to auscultation, no crackles or wheezes. Heart:  Normal rate and regular rhythm. S1 and S2 normal without gallop, murmur, click, rub or other extra sounds. Abdomen:  Bowel sounds positive,abdomen soft and non-tender without masses, organomegaly or hernias noted. Msk:  No deformity or scoliosis noted of thoracic or lumbar spine.   Extremities:  No clubbing, cyanosis, edema, or deformity noted with normal full range of motion of all joints.   Neurologic:  alert & oriented X3 and gait normal.   Cervical Nodes:  No lymphadenopathy noted Psych:  Cognition and judgment appear intact. Alert and cooperative with normal attention span and concentration. No apparent delusions, illusions, hallucinations  Assessment and Plan: 1. Routine general medical examination at a health care facility  Reviewed preventive care protocols, scheduled due services, and updated immunizations Discussed nutrition, exercise, diet, and healthy lifestyle.    2. HYPOTHYROIDISM  Deteriorated, unde rcorrected based on symptoms and high TSH although FT4 still within normal limits. Will increase synthroid to 100 mcg daily. Recheck labs in 8 weeks. The patient indicates understanding of these issues and agrees with the  plan.    3. HYPERTENSION  Stable on current meds.   4. HYPERLIPIDEMIA  Stable, given elevated LFTs and stable lipid panel, will decrease lipitor to 20 mg daily. The patient indicates understanding of these issues and agrees with the plan.    5. Elevated liver function tests   Likely due to increase tylenol use. Advised cutting back on tylenol/Goodie powders. Recheck lfts in 8 weeks.

## 2011-09-14 NOTE — Patient Instructions (Addendum)
It was good to see you. Please cut your lipitor in half to 20 mg daily. We are increasing your synthroid 100 mcg daily. Try to cut back on Goodies Powder and Tylenol and please come [place] for a lab visit in 8 weeks- we will recheck your liver, kidney and thyroid function.  Check with your insurance to see if they will cover the shingles shot.

## 2011-09-25 ENCOUNTER — Other Ambulatory Visit: Payer: Self-pay | Admitting: Family Medicine

## 2011-10-12 ENCOUNTER — Other Ambulatory Visit: Payer: Self-pay | Admitting: Family Medicine

## 2011-10-13 ENCOUNTER — Other Ambulatory Visit: Payer: Self-pay | Admitting: Family Medicine

## 2011-10-15 ENCOUNTER — Other Ambulatory Visit: Payer: Self-pay | Admitting: *Deleted

## 2011-11-09 ENCOUNTER — Other Ambulatory Visit: Payer: Self-pay | Admitting: *Deleted

## 2011-11-09 ENCOUNTER — Other Ambulatory Visit (INDEPENDENT_AMBULATORY_CARE_PROVIDER_SITE_OTHER): Payer: BC Managed Care – PPO

## 2011-11-09 DIAGNOSIS — E785 Hyperlipidemia, unspecified: Secondary | ICD-10-CM

## 2011-11-09 DIAGNOSIS — I10 Essential (primary) hypertension: Secondary | ICD-10-CM

## 2011-11-09 DIAGNOSIS — E039 Hypothyroidism, unspecified: Secondary | ICD-10-CM

## 2011-11-09 LAB — COMPREHENSIVE METABOLIC PANEL
Albumin: 4 g/dL (ref 3.5–5.2)
Alkaline Phosphatase: 49 U/L (ref 39–117)
BUN: 16 mg/dL (ref 6–23)
CO2: 29 mEq/L (ref 19–32)
GFR: 61.72 mL/min (ref >60.00)
Glucose, Bld: 81 mg/dL (ref 70–99)
Potassium: 4.3 mEq/L (ref 3.5–5.1)
Total Bilirubin: 1.1 mg/dL (ref 0.3–1.2)

## 2011-11-09 LAB — TSH: TSH: 0.26 u[IU]/mL — ABNORMAL LOW (ref 0.35–5.50)

## 2011-11-09 LAB — LIPID PANEL
Cholesterol: 157 mg/dL (ref 0–200)
VLDL: 29.8 mg/dL (ref 0.0–40.0)

## 2011-11-09 MED ORDER — LEVOTHYROXINE SODIUM 88 MCG PO TABS: 88.0000 ug | ORAL_TABLET | Freq: Every day | ORAL | Status: DC

## 2011-11-10 ENCOUNTER — Other Ambulatory Visit: Payer: Self-pay | Admitting: Family Medicine

## 2012-01-05 ENCOUNTER — Other Ambulatory Visit (INDEPENDENT_AMBULATORY_CARE_PROVIDER_SITE_OTHER): Payer: BC Managed Care – PPO

## 2012-01-05 DIAGNOSIS — E039 Hypothyroidism, unspecified: Secondary | ICD-10-CM

## 2012-01-05 LAB — TSH: TSH: 0.48 u[IU]/mL (ref 0.35–5.50)

## 2012-01-29 ENCOUNTER — Other Ambulatory Visit: Payer: Self-pay | Admitting: Family Medicine

## 2012-08-01 ENCOUNTER — Encounter: Payer: Self-pay | Admitting: Family Medicine

## 2012-09-02 ENCOUNTER — Other Ambulatory Visit: Payer: Self-pay | Admitting: *Deleted

## 2012-09-02 MED ORDER — LEVOTHYROXINE SODIUM 88 MCG PO TABS: ORAL_TABLET | ORAL | Status: DC

## 2012-09-06 ENCOUNTER — Other Ambulatory Visit: Payer: Self-pay | Admitting: Family Medicine

## 2012-09-06 DIAGNOSIS — Z0000 Encounter for general adult medical examination without abnormal findings: Secondary | ICD-10-CM

## 2012-09-06 DIAGNOSIS — E039 Hypothyroidism, unspecified: Secondary | ICD-10-CM

## 2012-09-06 DIAGNOSIS — I10 Essential (primary) hypertension: Secondary | ICD-10-CM

## 2012-09-06 DIAGNOSIS — E785 Hyperlipidemia, unspecified: Secondary | ICD-10-CM

## 2012-09-07 ENCOUNTER — Other Ambulatory Visit (INDEPENDENT_AMBULATORY_CARE_PROVIDER_SITE_OTHER): Payer: BC Managed Care – PPO

## 2012-09-07 DIAGNOSIS — E785 Hyperlipidemia, unspecified: Secondary | ICD-10-CM

## 2012-09-07 DIAGNOSIS — Z0000 Encounter for general adult medical examination without abnormal findings: Secondary | ICD-10-CM

## 2012-09-07 DIAGNOSIS — E039 Hypothyroidism, unspecified: Secondary | ICD-10-CM

## 2012-09-07 LAB — LIPID PANEL
HDL: 30.1 mg/dL — ABNORMAL LOW (ref >39.00)
LDL Cholesterol: 77 mg/dL (ref 0–99)
Total CHOL/HDL Ratio: 5
VLDL: 32.2 mg/dL (ref 0.0–40.0)

## 2012-09-07 LAB — CBC WITH DIFFERENTIAL/PLATELET
Basophils Relative: 0.3 % (ref 0.0–3.0)
Eosinophils Relative: 4.1 % (ref 0.0–5.0)
Lymphocytes Relative: 37.1 % (ref 12.0–46.0)
Neutrophils Relative %: 50.3 % (ref 43.0–77.0)
RBC: 2.9 Mil/uL — ABNORMAL LOW (ref 3.87–5.11)
WBC: 5.1 10*3/uL (ref 4.5–10.5)

## 2012-09-07 LAB — COMPREHENSIVE METABOLIC PANEL
ALT: 16 U/L (ref 0–35)
AST: 28 U/L (ref 0–37)
Creatinine, Ser: 0.9 mg/dL (ref 0.4–1.2)
Total Bilirubin: 1.4 mg/dL — ABNORMAL HIGH (ref 0.3–1.2)

## 2012-09-07 LAB — T4, FREE: Free T4: 1.47 ng/dL (ref 0.60–1.60)

## 2012-09-07 LAB — TSH: TSH: 0.59 u[IU]/mL (ref 0.35–5.50)

## 2012-09-14 ENCOUNTER — Ambulatory Visit (INDEPENDENT_AMBULATORY_CARE_PROVIDER_SITE_OTHER): Payer: BC Managed Care – PPO | Admitting: Family Medicine

## 2012-09-14 ENCOUNTER — Encounter: Payer: Self-pay | Admitting: Family Medicine

## 2012-09-14 VITALS — BP 122/70 | HR 85 | Temp 97.7°F | Ht 64.75 in | Wt 132.0 lb

## 2012-09-14 DIAGNOSIS — Z0000 Encounter for general adult medical examination without abnormal findings: Secondary | ICD-10-CM

## 2012-09-14 DIAGNOSIS — E785 Hyperlipidemia, unspecified: Secondary | ICD-10-CM

## 2012-09-14 DIAGNOSIS — I10 Essential (primary) hypertension: Secondary | ICD-10-CM

## 2012-09-14 DIAGNOSIS — E039 Hypothyroidism, unspecified: Secondary | ICD-10-CM

## 2012-09-14 DIAGNOSIS — L538 Other specified erythematous conditions: Secondary | ICD-10-CM

## 2012-09-14 DIAGNOSIS — J449 Chronic obstructive pulmonary disease, unspecified: Secondary | ICD-10-CM

## 2012-09-14 DIAGNOSIS — R7989 Other specified abnormal findings of blood chemistry: Secondary | ICD-10-CM

## 2012-09-14 DIAGNOSIS — L920 Granuloma annulare: Secondary | ICD-10-CM | POA: Insufficient documentation

## 2012-09-14 NOTE — Patient Instructions (Addendum)
Check with your insurance to see if they will cover the shingles shot.  Please make a lab appointment in 3 months.    Please call Dr. Kellie Simmering to make a follow up appointment.  Please call Dr. Sharlett Iles to make an appointment.

## 2012-09-14 NOTE — Progress Notes (Signed)
62 yo very pleasant female here for CPX.  Sees GYN, Dr. Marvia Pickles- has appt next week. S/p hysterectomy in 1990s- DUB. Mammogram normal in May.  UTD on colonscopy- last one in 2012.  No changes in bowel habits or blood in her stool.   Elevated LFTs-  Much improved from last year.    She does not drink alcohol.  Does take Goodies powder and tylenol for tension headaches.   Lab Results  Component Value Date   ALT 16 09/07/2012   AST 28 09/07/2012   ALKPHOS 44 09/07/2012   BILITOT 1.4* 09/07/2012    HLD-  Has been on Lipitor 40 mg daily and Tricor 160 mg daily.  Lab Results  Component Value Date   CHOL 139 09/07/2012   HDL 30.10* 09/07/2012   LDLCALC 77 09/07/2012   TRIG 161.0* 09/07/2012   CHOLHDL 5 09/07/2012    HTN- has been well controlled on Lotrel 5-10 mg and Meprolol 25 mg daily. No CP, SOB or LE edema.  Lab Results  Component Value Date   CREATININE 0.9 09/07/2012     Hypothyroidism- on Synthroid  78 micrograms daily.  No heat or cold intolerance. No changes in skin or hair. Lab Results  Component Value Date   TSH 0.59 09/07/2012    Tobacco abuse- 40 pack year history. Tried Chantix in past, just was not ready. Not currently ready to quit. Has a h/o AAA and knows that she needs to quit.   Abnormal CBC- Lab Results  Component Value Date   WBC 5.1 09/07/2012   HGB 13.0 09/07/2012   HCT 37.8 09/07/2012   MCV 130.5 Repeated and verified X2.* 09/07/2012   PLT 318.0 09/07/2012     Patient Active Problem List   Diagnosis Date Noted  . Routine general medical examination at a health care facility 09/14/2011  . Elevated liver function tests 09/14/2011  . Abnormal mammogram 04/08/2011  . Low back pain with sciatica 08/21/2010  . HYPOTHYROIDISM 05/30/2010  . HYPERLIPIDEMIA 05/30/2010  . TOBACCO ABUSE 05/30/2010  . HYPERTENSION 05/30/2010  . ABDOMINAL AORTIC ANEURYSM 05/30/2010  . COPD 05/30/2010  . GERD 05/30/2010   Past Medical History  Diagnosis Date  . Hyperlipidemia    . Hypertension   . Thyroid disease   . GERD (gastroesophageal reflux disease)    Past Surgical History  Procedure Laterality Date  . Vaginal hysterectomy    . Tonsillectomy     History  Substance Use Topics  . Smoking status: Current Every Day Smoker -- 0.50 packs/day for 45 years  . Smokeless tobacco: Never Used  . Alcohol Use: No   Family History  Problem Relation Age of Onset  . Hypertension Other    No Known Allergies Current Outpatient Prescriptions on File Prior to Visit  Medication Sig Dispense Refill  . amLODipine-benazepril (LOTREL) 5-10 MG per capsule TAKE ONE CAPSULE BY MOUTH EVERY DAY  30 capsule  11  . cholecalciferol (VITAMIN D) 1000 UNITS tablet Take 1,000 Units by mouth 2 (two) times daily.       Marland Kitchen dexlansoprazole (DEXILANT) 60 MG capsule Take 60 mg by mouth daily.        Marland Kitchen estradiol (ESTRACE) 0.1 MG/GM vaginal cream Apply twice a week       . fenofibrate 160 MG tablet TAKE 1 TABLET (160 MG TOTAL) BY MOUTH DAILY.  30 tablet  11  . levothyroxine (SYNTHROID, LEVOTHROID) 88 MCG tablet TAKE 1 TABLET BY MOUTH DAILY.  30 tablet  6  .  metoprolol tartrate (LOPRESSOR) 25 MG tablet Take one by mouth daily.  30 tablet  11   No current facility-administered medications on file prior to visit.     Review of Systems  See HPI Patient reports no  vision/ hearing changes,anorexia, weight change, fever ,adenopathy, persistant / recurrent hoarseness, swallowing issues, chest pain, edema,persistant / recurrent cough, hemoptysis, dyspnea(rest, exertional, paroxysmal nocturnal), gastrointestinal  bleeding (melena, rectal bleeding), abdominal pain, excessive heart burn, GU symptoms(dysuria, hematuria, pyuria, voiding/incontinence  Issues) syncope, focal weakness, severe memory loss, concerning skin lesions, depression, anxiety, abnormal bruising/bleeding, major joint swelling, breast masses or abnormal vaginal bleeding.     Physical Exam  BP 122/70  Pulse 85  Temp(Src) 97.7 F  (36.5 C)  Ht 5' 4.75" (1.645 m)  Wt 132 lb (59.875 kg)  BMI 22.13 kg/m2  General:  Well-developed,well-nourished,in no acute distress; alert,appropriate and cooperative throughout examination Head:  normocephalic and atraumatic.   Eyes:  vision grossly intact, pupils equal, pupils round, and pupils reactive to light.   Ears:  R ear normal and L ear normal.   Nose:  no external deformity.   Mouth:  good dentition.   Neck:  No deformities, masses, or tenderness noted. Lungs:  Normal respiratory effort, chest expands symmetrically. Lungs are clear to auscultation, no crackles or wheezes. Heart:  Normal rate and regular rhythm. S1 and S2 normal without gallop, murmur, click, rub or other extra sounds. Abdomen:  Bowel sounds positive,abdomen soft and non-tender without masses, organomegaly or hernias noted. Msk:  No deformity or scoliosis noted of thoracic or lumbar spine.   Extremities:  No clubbing, cyanosis, edema, or deformity noted with normal full range of motion of all joints.   Neurologic:  alert & oriented X3 and gait normal.   Cervical Nodes:  No lymphadenopathy noted Psych:  Cognition and judgment appear intact. Alert and cooperative with normal attention span and concentration. No apparent delusions, illusions, hallucinations Skin:  Lattice lesions on legs, back and abdomen  Assessment and Plan: 1. Routine general medical examination at a health care facility Reviewed preventive care protocols, scheduled due services, and updated immunizations Discussed nutrition, exercise, diet, and healthy lifestyle.   2. Abnormal CBC New- will recheck CBC in 3 months, along with B12/folate. - CBC with Differential; Future  3. HYPERLIPIDEMIA Improved from last year- continue current meds.  4. HYPERTENSION Continue current meds.  5. HYPOTHYROIDISM Stable on current dose of synthroid. No changes.  6. COPD Not yet ready to quit.  7. Granuloma annulare Stable but did advised that  she follow up with dermatology as some lesions could be early cancerous changes. The patient indicates understanding of these issues and agrees with the plan.

## 2012-10-05 ENCOUNTER — Other Ambulatory Visit: Payer: Self-pay | Admitting: *Deleted

## 2012-10-05 MED ORDER — FENOFIBRATE 160 MG PO TABS: ORAL_TABLET | ORAL | Status: DC

## 2012-10-24 ENCOUNTER — Other Ambulatory Visit: Payer: Self-pay | Admitting: *Deleted

## 2012-10-24 MED ORDER — AMLODIPINE BESY-BENAZEPRIL HCL 5-10 MG PO CAPS: ORAL_CAPSULE | ORAL | Status: DC

## 2012-10-24 MED ORDER — METOPROLOL TARTRATE 25 MG PO TABS: ORAL_TABLET | ORAL | Status: DC

## 2012-10-31 ENCOUNTER — Other Ambulatory Visit: Payer: Self-pay | Admitting: *Deleted

## 2012-10-31 MED ORDER — ATORVASTATIN CALCIUM 40 MG PO TABS: 40.0000 mg | ORAL_TABLET | Freq: Every day | ORAL | Status: DC

## 2012-11-18 ENCOUNTER — Ambulatory Visit: Payer: Self-pay | Admitting: Obstetrics and Gynecology

## 2012-11-21 ENCOUNTER — Ambulatory Visit (INDEPENDENT_AMBULATORY_CARE_PROVIDER_SITE_OTHER): Payer: BC Managed Care – PPO | Admitting: Obstetrics and Gynecology

## 2012-11-21 ENCOUNTER — Encounter: Payer: Self-pay | Admitting: Obstetrics and Gynecology

## 2012-11-21 VITALS — BP 155/76 | HR 86 | Resp 16 | Ht 64.0 in | Wt 131.0 lb

## 2012-11-21 DIAGNOSIS — Z01419 Encounter for gynecological examination (general) (routine) without abnormal findings: Secondary | ICD-10-CM

## 2012-11-21 MED ORDER — ESTROGENS, CONJUGATED 0.625 MG/GM VA CREA: TOPICAL_CREAM | Freq: Every day | VAGINAL | Status: DC

## 2012-11-21 NOTE — Patient Instructions (Addendum)
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
## 2012-12-08 ENCOUNTER — Other Ambulatory Visit: Payer: Self-pay | Admitting: Family Medicine

## 2012-12-08 DIAGNOSIS — R7989 Other specified abnormal findings of blood chemistry: Secondary | ICD-10-CM

## 2012-12-15 ENCOUNTER — Other Ambulatory Visit (INDEPENDENT_AMBULATORY_CARE_PROVIDER_SITE_OTHER): Payer: BC Managed Care – PPO

## 2012-12-15 ENCOUNTER — Other Ambulatory Visit: Payer: Self-pay | Admitting: Family Medicine

## 2012-12-15 DIAGNOSIS — R7989 Other specified abnormal findings of blood chemistry: Secondary | ICD-10-CM

## 2012-12-15 DIAGNOSIS — E538 Deficiency of other specified B group vitamins: Secondary | ICD-10-CM

## 2012-12-15 LAB — CBC WITH DIFFERENTIAL/PLATELET
Basophils Absolute: 0 10*3/uL (ref 0.0–0.1)
Eosinophils Absolute: 0.2 10*3/uL (ref 0.0–0.7)
Hemoglobin: 13.3 g/dL (ref 12.0–15.0)
Lymphocytes Relative: 42.4 % (ref 12.0–46.0)
Lymphs Abs: 1.8 10*3/uL (ref 0.7–4.0)
MCHC: 33.7 g/dL (ref 30.0–36.0)
Neutro Abs: 1.9 10*3/uL (ref 1.4–7.7)
Platelets: 262 10*3/uL (ref 150.0–400.0)
RDW: 16.3 % — ABNORMAL HIGH (ref 11.5–14.6)

## 2012-12-15 LAB — FOLATE: Folate: >24.8 ng/mL (ref >5.9)

## 2012-12-16 ENCOUNTER — Ambulatory Visit (INDEPENDENT_AMBULATORY_CARE_PROVIDER_SITE_OTHER): Payer: BC Managed Care – PPO | Admitting: *Deleted

## 2012-12-16 DIAGNOSIS — E538 Deficiency of other specified B group vitamins: Secondary | ICD-10-CM

## 2012-12-16 MED ORDER — CYANOCOBALAMIN 1000 MCG/ML IJ SOLN
1000.0000 ug | Freq: Once | INTRAMUSCULAR | Status: AC
  Administered 2012-12-16: 1000 ug via INTRAMUSCULAR

## 2012-12-20 ENCOUNTER — Ambulatory Visit: Payer: Self-pay | Admitting: Hematology and Oncology

## 2012-12-22 ENCOUNTER — Ambulatory Visit: Payer: Self-pay | Admitting: Hematology and Oncology

## 2012-12-22 LAB — CBC CANCER CENTER
Basophil #: 0 x10 3/mm (ref 0.0–0.1)
Basophil %: 0.3 %
HCT: 37.8 % (ref 35.0–47.0)
Lymphocyte #: 1.8 x10 3/mm (ref 1.0–3.6)
Lymphocyte %: 35.5 %
MCH: 41.1 pg — ABNORMAL HIGH (ref 26.0–34.0)
Monocyte #: 0.7 x10 3/mm (ref 0.2–0.9)
Monocyte %: 13.8 %
Neutrophil #: 2.4 x10 3/mm (ref 1.4–6.5)
Neutrophil %: 46.4 %
RBC: 3.29 10*6/uL — ABNORMAL LOW (ref 3.80–5.20)
RDW: 16.7 % — ABNORMAL HIGH (ref 11.5–14.5)

## 2012-12-28 ENCOUNTER — Ambulatory Visit: Payer: Self-pay | Admitting: Hematology and Oncology

## 2012-12-28 LAB — BASIC METABOLIC PANEL
BUN: 18 mg/dL (ref 7–18)
Chloride: 106 mmol/L (ref 98–107)
Creatinine: 1.14 mg/dL (ref 0.60–1.30)
EGFR (Non-African Amer.): 51 — ABNORMAL LOW
Osmolality: 286 (ref 275–301)

## 2013-01-04 LAB — CBC CANCER CENTER
Basophil #: 0.1 x10 3/mm (ref 0.0–0.1)
Basophil %: 1.3 %
Eosinophil #: 0.2 x10 3/mm (ref 0.0–0.7)
HCT: 41.5 % (ref 35.0–47.0)
HGB: 14.2 g/dL (ref 12.0–16.0)
Lymphocyte #: 2 x10 3/mm (ref 1.0–3.6)
MCHC: 34.2 g/dL (ref 32.0–36.0)
MCV: 111 fL — ABNORMAL HIGH (ref 80–100)
Monocyte %: 9 %
Neutrophil %: 59 %
Platelet: 379 x10 3/mm (ref 150–440)
RBC: 3.74 10*6/uL — ABNORMAL LOW (ref 3.80–5.20)
WBC: 7.2 x10 3/mm (ref 3.6–11.0)

## 2013-01-04 LAB — POTASSIUM: Potassium: 4.4 mmol/L (ref 3.5–5.1)

## 2013-01-28 ENCOUNTER — Ambulatory Visit: Payer: Self-pay | Admitting: Hematology and Oncology

## 2013-02-27 ENCOUNTER — Ambulatory Visit: Payer: Self-pay | Admitting: Hematology and Oncology

## 2013-03-13 ENCOUNTER — Telehealth: Payer: Self-pay | Admitting: Gynecology

## 2013-03-13 NOTE — Telephone Encounter (Signed)
Spoke with patient. She has been on Minocycline 200 mg q day that was rx by her Dermatologist. Now has irritated skin and vaginal discharge. Requested OV. Scheduled for 12/16 with Regina Eck CNM

## 2013-03-13 NOTE — Telephone Encounter (Signed)
Pt says her bottom feels raw and would like an appointment to come in.

## 2013-03-14 ENCOUNTER — Ambulatory Visit (INDEPENDENT_AMBULATORY_CARE_PROVIDER_SITE_OTHER): Payer: BC Managed Care – PPO | Admitting: Certified Nurse Midwife

## 2013-03-14 ENCOUNTER — Encounter: Payer: Self-pay | Admitting: Certified Nurse Midwife

## 2013-03-14 VITALS — BP 141/80 | HR 80 | Resp 16 | Ht 64.0 in | Wt 123.0 lb

## 2013-03-14 DIAGNOSIS — N390 Urinary tract infection, site not specified: Secondary | ICD-10-CM

## 2013-03-14 DIAGNOSIS — B3731 Acute candidiasis of vulva and vagina: Secondary | ICD-10-CM

## 2013-03-14 DIAGNOSIS — N952 Postmenopausal atrophic vaginitis: Secondary | ICD-10-CM

## 2013-03-14 DIAGNOSIS — B373 Candidiasis of vulva and vagina: Secondary | ICD-10-CM

## 2013-03-14 LAB — POCT URINALYSIS DIPSTICK
Blood, UA: NEGATIVE
Nitrite, UA: NEGATIVE
Protein, UA: NEGATIVE
Urobilinogen, UA: NEGATIVE

## 2013-03-14 MED ORDER — TERCONAZOLE 0.4 % VA CREA: 1.0000 | TOPICAL_CREAM | Freq: Every day | VAGINAL | Status: DC

## 2013-03-14 NOTE — Progress Notes (Signed)
62 y.o.MarriedCaucasian female G1P1 with a 5 day(s) history of the following:urinary symptoms of incomplete emptying, and dark urine Sexually active: yes Last sexual activity:8days ago. Pt also reports the following associated symptoms: irritation on vulva, with burning when urine touches skin. Denies back ache, fever or chills or headache. Patient has not tried over the counter treatment.  No new personal products. Patient also uses Premarin cream for vaginal dryness, with good results. Saw dermatologist for follow up of granuloma annulare and was put on Minocycline. Patient under treatment for another skin cancer. Patient is able to empty bladder, but feels the need to go again. No other health issue today.  O: Healthy female WDWN Affect: normal, orientation x 3 Skin: warm and dry Abdomen: non tender, negative suprapubic, bladder not distended to palpation, patient very small frame CVAT: negative bilateral    Exam:  [id number], Urethra, Skene's normal Bladder not tender, urethral meatus slight redness, non tender                Vag:no lesions, discharge: scant, thin and odorless, pH 4.0, wet prep done                Cx:  absent                Uterus:absent                Adnexa: normal adnexa and no mass, fullness, tenderness  Wet Prep shows:yeast  Poct urine-wbc 2+ dark in color  A:Yeast vaginitis Atrophic vaginitis uses Premarin cream with good response History of VAIN 2 with normal pap in 10/2012. R/O UTI, ? Hydration level  P: Reviewed findings and that yeast can stimulate bladder symptoms. Rx Terazol & see order Continue use as prescribed to decrease vaginal and urinary problems Discussed importance of adequate water intake to decrease risk of UTI and irritation to skin. Lab: Urine micro/culture  Rv prn

## 2013-03-15 LAB — URINALYSIS, MICROSCOPIC ONLY: Bacteria, UA: NONE SEEN

## 2013-03-30 ENCOUNTER — Ambulatory Visit: Payer: Self-pay | Admitting: Hematology and Oncology

## 2013-04-30 ENCOUNTER — Ambulatory Visit: Payer: Self-pay | Admitting: Hematology and Oncology

## 2013-05-01 ENCOUNTER — Other Ambulatory Visit: Payer: Self-pay | Admitting: Family Medicine

## 2013-05-23 ENCOUNTER — Other Ambulatory Visit: Payer: Self-pay | Admitting: Family Medicine

## 2013-05-28 ENCOUNTER — Ambulatory Visit: Payer: Self-pay | Admitting: Hematology and Oncology

## 2013-06-01 ENCOUNTER — Other Ambulatory Visit: Payer: Self-pay | Admitting: Family Medicine

## 2013-06-22 ENCOUNTER — Other Ambulatory Visit: Payer: Self-pay | Admitting: Certified Nurse Midwife

## 2013-06-22 NOTE — Telephone Encounter (Signed)
eScribe request from [facility] for refill on ESTRACE CREAM Pt was given 1 year supply of PREMARIN cream at [facility] in 10/2012.  Per pharmacy ESTRACE is currently cheaper. Next [facility] - 11/22/13.  Please advise if substitution is OK.

## 2013-06-28 ENCOUNTER — Ambulatory Visit: Payer: Self-pay | Admitting: Hematology and Oncology

## 2013-06-30 ENCOUNTER — Other Ambulatory Visit: Payer: Self-pay | Admitting: Family Medicine

## 2013-07-03 ENCOUNTER — Other Ambulatory Visit: Payer: Self-pay | Admitting: *Deleted

## 2013-07-03 MED ORDER — LEVOTHYROXINE SODIUM 88 MCG PO TABS: ORAL_TABLET | ORAL | Status: DC

## 2013-07-05 ENCOUNTER — Telehealth: Payer: Self-pay | Admitting: Certified Nurse Midwife

## 2013-07-05 MED ORDER — ESTRADIOL 0.1 MG/GM VA CREA: TOPICAL_CREAM | VAGINAL | Status: DC

## 2013-07-05 NOTE — Telephone Encounter (Signed)
Ok to switch back to Estrace cream again due to cost.

## 2013-07-05 NOTE — Telephone Encounter (Signed)
Patient taking Premarin and it is costing her $250.00 at month due to a change with her insurance. Patient wants to go back to Estrace cream.  [facility] [phone]

## 2013-07-05 NOTE — Telephone Encounter (Signed)
Spoke with patient. Advised okay to switch back to Estrace cream. Order sent over to patients pharmacy of choice. Patient is agreeable and verbalizes understanding.  Routing to provider for final review. Patient agreeable to disposition. Will close encounter

## 2013-07-05 NOTE — Telephone Encounter (Addendum)
Regina Eck CNM, okay to switch from Premarin back to Estrace cream? Order pending for approval if it is okay. Last [facility] 11/21/12. Began Estrace on 07/08/10 discontinued on 11/21/12. Began Premarin 11/21/12.

## 2013-08-03 ENCOUNTER — Ambulatory Visit: Payer: Self-pay | Admitting: Hematology and Oncology

## 2013-08-03 LAB — CBC CANCER CENTER
Basophil #: 0.1 x10 3/mm (ref 0.0–0.1)
Basophil %: 1.2 %
Eosinophil #: 0.3 x10 3/mm (ref 0.0–0.7)
Eosinophil %: 3.6 %
HCT: 39.3 % (ref 35.0–47.0)
HGB: 13.1 g/dL (ref 12.0–16.0)
LYMPHS ABS: 2.2 x10 3/mm (ref 1.0–3.6)
Lymphocyte %: 30.8 %
MCH: 29.1 pg (ref 26.0–34.0)
MCHC: 33.4 g/dL (ref 32.0–36.0)
MCV: 87 fL (ref 80–100)
Monocyte #: 0.5 x10 3/mm (ref 0.2–0.9)
Monocyte %: 7.7 %
Neutrophil #: 4 x10 3/mm (ref 1.4–6.5)
Neutrophil %: 56.7 %
Platelet: 334 x10 3/mm (ref 150–440)
RBC: 4.51 10*6/uL (ref 3.80–5.20)
RDW: 13 % (ref 11.5–14.5)
WBC: 7.1 x10 3/mm (ref 3.6–11.0)

## 2013-08-03 LAB — COMPREHENSIVE METABOLIC PANEL
AST: 27 U/L (ref 15–37)
Albumin: 3.8 g/dL (ref 3.4–5.0)
Alkaline Phosphatase: 83 U/L
Anion Gap: 9 (ref 7–16)
BUN: 13 mg/dL (ref 7–18)
Bilirubin,Total: 0.5 mg/dL (ref 0.2–1.0)
CALCIUM: 9.2 mg/dL (ref 8.5–10.1)
Chloride: 102 mmol/L (ref 98–107)
Co2: 27 mmol/L (ref 21–32)
Creatinine: 1.01 mg/dL (ref 0.60–1.30)
EGFR (African American): >60
EGFR (Non-African Amer.): 59 — ABNORMAL LOW
Glucose: 86 mg/dL (ref 65–99)
Osmolality: 275 (ref 275–301)
Potassium: 4.5 mmol/L (ref 3.5–5.1)
SGPT (ALT): 14 U/L (ref 12–78)
Sodium: 138 mmol/L (ref 136–145)
Total Protein: 7.1 g/dL (ref 6.4–8.2)

## 2013-08-28 ENCOUNTER — Ambulatory Visit: Payer: Self-pay | Admitting: Hematology and Oncology

## 2013-09-04 ENCOUNTER — Other Ambulatory Visit: Payer: Self-pay | Admitting: Family Medicine

## 2013-09-06 ENCOUNTER — Other Ambulatory Visit: Payer: Self-pay | Admitting: Family Medicine

## 2013-09-06 DIAGNOSIS — Z0000 Encounter for general adult medical examination without abnormal findings: Secondary | ICD-10-CM

## 2013-09-06 DIAGNOSIS — I10 Essential (primary) hypertension: Secondary | ICD-10-CM

## 2013-09-06 DIAGNOSIS — E039 Hypothyroidism, unspecified: Secondary | ICD-10-CM

## 2013-09-06 DIAGNOSIS — E785 Hyperlipidemia, unspecified: Secondary | ICD-10-CM

## 2013-09-11 ENCOUNTER — Other Ambulatory Visit (INDEPENDENT_AMBULATORY_CARE_PROVIDER_SITE_OTHER): Payer: BC Managed Care – PPO

## 2013-09-11 DIAGNOSIS — Z0000 Encounter for general adult medical examination without abnormal findings: Secondary | ICD-10-CM

## 2013-09-11 DIAGNOSIS — E039 Hypothyroidism, unspecified: Secondary | ICD-10-CM

## 2013-09-11 DIAGNOSIS — E785 Hyperlipidemia, unspecified: Secondary | ICD-10-CM

## 2013-09-11 LAB — LIPID PANEL
CHOLESTEROL: 162 mg/dL (ref 0–200)
HDL: 33.2 mg/dL — ABNORMAL LOW (ref >39.00)
LDL Cholesterol: 98 mg/dL (ref 0–99)
NonHDL: 128.8
Total CHOL/HDL Ratio: 5
Triglycerides: 154 mg/dL — ABNORMAL HIGH (ref 0.0–149.0)
VLDL: 30.8 mg/dL (ref 0.0–40.0)

## 2013-09-11 LAB — CBC WITH DIFFERENTIAL/PLATELET
Basophils Absolute: 0 10*3/uL (ref 0.0–0.1)
Basophils Relative: 0.6 % (ref 0.0–3.0)
EOS PCT: 4.1 % (ref 0.0–5.0)
Eosinophils Absolute: 0.3 10*3/uL (ref 0.0–0.7)
HEMATOCRIT: 40.9 % (ref 36.0–46.0)
Hemoglobin: 13.6 g/dL (ref 12.0–15.0)
LYMPHS ABS: 1.9 10*3/uL (ref 0.7–4.0)
Lymphocytes Relative: 29.7 % (ref 12.0–46.0)
MCHC: 33.2 g/dL (ref 30.0–36.0)
MCV: 86.2 fl (ref 78.0–100.0)
Monocytes Absolute: 0.6 10*3/uL (ref 0.1–1.0)
Monocytes Relative: 9.2 % (ref 3.0–12.0)
NEUTROS PCT: 56.4 % (ref 43.0–77.0)
Neutro Abs: 3.6 10*3/uL (ref 1.4–7.7)
PLATELETS: 368 10*3/uL (ref 150.0–400.0)
RBC: 4.74 Mil/uL (ref 3.87–5.11)
RDW: 14.9 % (ref 11.5–15.5)
WBC: 6.4 10*3/uL (ref 4.0–10.5)

## 2013-09-11 LAB — COMPREHENSIVE METABOLIC PANEL
ALBUMIN: 4 g/dL (ref 3.5–5.2)
ALT: 13 U/L (ref 0–35)
AST: 28 U/L (ref 0–37)
Alkaline Phosphatase: 60 U/L (ref 39–117)
BUN: 16 mg/dL (ref 6–23)
CO2: 29 meq/L (ref 19–32)
Calcium: 9.4 mg/dL (ref 8.4–10.5)
Chloride: 106 mEq/L (ref 96–112)
Creatinine, Ser: 1 mg/dL (ref 0.4–1.2)
GFR: 60.63 mL/min (ref >60.00)
GLUCOSE: 90 mg/dL (ref 70–99)
POTASSIUM: 4.3 meq/L (ref 3.5–5.1)
SODIUM: 140 meq/L (ref 135–145)
TOTAL PROTEIN: 6.7 g/dL (ref 6.0–8.3)
Total Bilirubin: 0.5 mg/dL (ref 0.2–1.2)

## 2013-09-11 LAB — T4, FREE: FREE T4: 1.12 ng/dL (ref 0.60–1.60)

## 2013-09-11 LAB — TSH: TSH: 3.41 u[IU]/mL (ref 0.35–4.50)

## 2013-09-18 ENCOUNTER — Ambulatory Visit (INDEPENDENT_AMBULATORY_CARE_PROVIDER_SITE_OTHER): Payer: BC Managed Care – PPO | Admitting: Family Medicine

## 2013-09-18 ENCOUNTER — Encounter: Payer: Self-pay | Admitting: Family Medicine

## 2013-09-18 VITALS — BP 132/72 | HR 82 | Temp 98.2°F | Ht 64.5 in | Wt 129.5 lb

## 2013-09-18 DIAGNOSIS — I714 Abdominal aortic aneurysm, without rupture, unspecified: Secondary | ICD-10-CM

## 2013-09-18 DIAGNOSIS — E039 Hypothyroidism, unspecified: Secondary | ICD-10-CM

## 2013-09-18 DIAGNOSIS — F17200 Nicotine dependence, unspecified, uncomplicated: Secondary | ICD-10-CM

## 2013-09-18 DIAGNOSIS — D510 Vitamin B12 deficiency anemia due to intrinsic factor deficiency: Secondary | ICD-10-CM | POA: Insufficient documentation

## 2013-09-18 DIAGNOSIS — Z0000 Encounter for general adult medical examination without abnormal findings: Secondary | ICD-10-CM

## 2013-09-18 DIAGNOSIS — E785 Hyperlipidemia, unspecified: Secondary | ICD-10-CM

## 2013-09-18 DIAGNOSIS — Z79890 Hormone replacement therapy (postmenopausal): Secondary | ICD-10-CM | POA: Insufficient documentation

## 2013-09-18 DIAGNOSIS — I10 Essential (primary) hypertension: Secondary | ICD-10-CM

## 2013-09-18 MED ORDER — ATORVASTATIN CALCIUM 20 MG PO TABS: 20.0000 mg | ORAL_TABLET | Freq: Every day | ORAL | Status: DC

## 2013-09-18 MED ORDER — AMLODIPINE BESY-BENAZEPRIL HCL 5-10 MG PO CAPS: ORAL_CAPSULE | ORAL | Status: DC

## 2013-09-18 MED ORDER — OMEPRAZOLE 40 MG PO CPDR: 40.0000 mg | DELAYED_RELEASE_CAPSULE | ORAL | Status: DC | PRN

## 2013-09-18 MED ORDER — FENOFIBRATE 160 MG PO TABS: ORAL_TABLET | ORAL | Status: DC

## 2013-09-18 NOTE — Assessment & Plan Note (Signed)
Monthly IM B12 per heme.

## 2013-09-18 NOTE — Assessment & Plan Note (Signed)
Well controlled on current dose of synthroid. No changes.

## 2013-09-18 NOTE — Assessment & Plan Note (Signed)
Reviewed preventive care protocols, scheduled due services, and updated immunizations Discussed nutrition, exercise, diet, and healthy lifestyle.  She will call to set up mammogram. Declines Zostavax today.

## 2013-09-18 NOTE — Progress Notes (Signed)
Pre visit review using our clinic review tool, if applicable. No additional management support is needed unless otherwise documented below in the visit note. 

## 2013-09-18 NOTE — Patient Instructions (Addendum)
Great to see you. Please call to set up your mammogram.  Within a year of going on medicare, please set up your welcome to medicare physical- they cover quite a bit during that visit.  We will again talk about your vaccines at that visit- shingles, pneumonia and getting some imaging like a CAT scan of your chest to rule out lung cancer and an ultrasound of your abdomen to look at your aneurysm.

## 2013-09-18 NOTE — Assessment & Plan Note (Signed)
Well controlled on current rx.

## 2013-09-18 NOTE — Assessment & Plan Note (Signed)
Well controlled on current rx. No changes. 

## 2013-09-18 NOTE — Assessment & Plan Note (Signed)
Declining repeat imaging at this time.
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
## 2013-09-19 ENCOUNTER — Telehealth: Payer: Self-pay | Admitting: Family Medicine

## 2013-09-19 NOTE — Telephone Encounter (Signed)
Relevant patient education assigned to patient using Emmi. ° °

## 2013-09-27 ENCOUNTER — Ambulatory Visit: Payer: Self-pay | Admitting: Hematology and Oncology

## 2013-10-23 ENCOUNTER — Other Ambulatory Visit: Payer: Self-pay | Admitting: Family Medicine

## 2013-10-28 ENCOUNTER — Ambulatory Visit: Payer: Self-pay | Admitting: Hematology and Oncology

## 2013-11-22 ENCOUNTER — Ambulatory Visit (INDEPENDENT_AMBULATORY_CARE_PROVIDER_SITE_OTHER): Payer: BC Managed Care – PPO | Admitting: Certified Nurse Midwife

## 2013-11-22 ENCOUNTER — Encounter: Payer: Self-pay | Admitting: Certified Nurse Midwife

## 2013-11-22 VITALS — BP 98/64 | HR 68 | Resp 16 | Ht 64.5 in | Wt 128.0 lb

## 2013-11-22 DIAGNOSIS — Z01419 Encounter for gynecological examination (general) (routine) without abnormal findings: Secondary | ICD-10-CM

## 2013-11-22 DIAGNOSIS — Z124 Encounter for screening for malignant neoplasm of cervix: Secondary | ICD-10-CM

## 2013-11-22 DIAGNOSIS — N893 Dysplasia of vagina, unspecified: Secondary | ICD-10-CM

## 2013-11-22 DIAGNOSIS — N891 Moderate vaginal dysplasia: Secondary | ICD-10-CM | POA: Insufficient documentation

## 2013-11-22 DIAGNOSIS — Z0000 Encounter for general adult medical examination without abnormal findings: Secondary | ICD-10-CM

## 2013-11-22 LAB — POCT URINALYSIS DIPSTICK
BILIRUBIN UA: NEGATIVE
Glucose, UA: NEGATIVE
Ketones, UA: NEGATIVE
Leukocytes, UA: NEGATIVE
NITRITE UA: NEGATIVE
PH UA: 5
Protein, UA: NEGATIVE
RBC UA: NEGATIVE
Urobilinogen, UA: NEGATIVE
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
## 2013-11-22 NOTE — Patient Instructions (Signed)

## 2013-11-23 LAB — IPS PAP TEST WITH REFLEX TO HPV

## 2013-11-24 NOTE — Progress Notes (Signed)
Reviewed personally.  M. Suzanne Keyton Bhat, MD.  

## 2013-12-06 ENCOUNTER — Other Ambulatory Visit: Payer: Self-pay | Admitting: Family Medicine

## 2014-01-12 ENCOUNTER — Other Ambulatory Visit: Payer: Self-pay

## 2014-01-18 ENCOUNTER — Ambulatory Visit: Payer: Self-pay | Admitting: Internal Medicine

## 2014-01-20 ENCOUNTER — Other Ambulatory Visit: Payer: Self-pay | Admitting: Family Medicine

## 2014-01-29 ENCOUNTER — Encounter: Payer: Self-pay | Admitting: Certified Nurse Midwife

## 2014-02-08 ENCOUNTER — Ambulatory Visit: Payer: Self-pay | Admitting: Hematology and Oncology

## 2014-02-08 LAB — CBC CANCER CENTER
Basophil #: 0.1 x10 3/mm (ref 0.0–0.1)
Basophil %: 1 %
EOS PCT: 2.5 %
Eosinophil #: 0.2 x10 3/mm (ref 0.0–0.7)
HCT: 39.5 % (ref 35.0–47.0)
HGB: 13.1 g/dL (ref 12.0–16.0)
Lymphocyte #: 2.5 x10 3/mm (ref 1.0–3.6)
Lymphocyte %: 32.7 %
MCH: 29.1 pg (ref 26.0–34.0)
MCHC: 33.3 g/dL (ref 32.0–36.0)
MCV: 87 fL (ref 80–100)
Monocyte #: 0.5 x10 3/mm (ref 0.2–0.9)
Monocyte %: 7 %
Neutrophil #: 4.4 x10 3/mm (ref 1.4–6.5)
Neutrophil %: 56.8 %
Platelet: 327 x10 3/mm (ref 150–440)
RBC: 4.52 10*6/uL (ref 3.80–5.20)
RDW: 14.9 % — ABNORMAL HIGH (ref 11.5–14.5)
WBC: 7.8 x10 3/mm (ref 3.6–11.0)

## 2014-02-08 LAB — FOLATE: Folic Acid: 16.7 ng/mL (ref 3.1–100.0)

## 2014-02-19 ENCOUNTER — Other Ambulatory Visit: Payer: Self-pay | Admitting: Family Medicine

## 2014-02-27 ENCOUNTER — Ambulatory Visit: Payer: Self-pay | Admitting: Hematology and Oncology

## 2014-03-06 ENCOUNTER — Other Ambulatory Visit: Payer: Self-pay | Admitting: Family Medicine

## 2014-03-13 ENCOUNTER — Emergency Department: Payer: Self-pay | Admitting: Emergency Medicine

## 2014-03-13 DIAGNOSIS — R05 Cough: Secondary | ICD-10-CM | POA: Diagnosis not present

## 2014-03-13 DIAGNOSIS — I10 Essential (primary) hypertension: Secondary | ICD-10-CM | POA: Diagnosis not present

## 2014-03-13 DIAGNOSIS — R0602 Shortness of breath: Secondary | ICD-10-CM | POA: Diagnosis not present

## 2014-03-13 DIAGNOSIS — J40 Bronchitis, not specified as acute or chronic: Secondary | ICD-10-CM | POA: Diagnosis not present

## 2014-03-13 DIAGNOSIS — J680 Bronchitis and pneumonitis due to chemicals, gases, fumes and vapors: Secondary | ICD-10-CM | POA: Diagnosis not present

## 2014-03-13 DIAGNOSIS — R0902 Hypoxemia: Secondary | ICD-10-CM | POA: Diagnosis not present

## 2014-03-13 DIAGNOSIS — J069 Acute upper respiratory infection, unspecified: Secondary | ICD-10-CM | POA: Diagnosis not present

## 2014-03-13 DIAGNOSIS — Z720 Tobacco use: Secondary | ICD-10-CM | POA: Diagnosis not present

## 2014-03-13 DIAGNOSIS — F17200 Nicotine dependence, unspecified, uncomplicated: Secondary | ICD-10-CM | POA: Diagnosis not present

## 2014-03-13 LAB — BASIC METABOLIC PANEL
Anion Gap: 6 — ABNORMAL LOW (ref 7–16)
BUN: 15 mg/dL (ref 7–18)
CREATININE: 1.19 mg/dL (ref 0.60–1.30)
Calcium, Total: 8.7 mg/dL (ref 8.5–10.1)
Chloride: 106 mmol/L (ref 98–107)
Co2: 26 mmol/L (ref 21–32)
EGFR (African American): 59 — ABNORMAL LOW
GFR CALC NON AF AMER: 49 — AB
GLUCOSE: 92 mg/dL (ref 65–99)
Osmolality: 276 (ref 275–301)
POTASSIUM: 3.8 mmol/L (ref 3.5–5.1)
Sodium: 138 mmol/L (ref 136–145)

## 2014-03-13 LAB — CBC
HCT: 40.6 % (ref 35.0–47.0)
HGB: 13.2 g/dL (ref 12.0–16.0)
MCH: 28.9 pg (ref 26.0–34.0)
MCHC: 32.6 g/dL (ref 32.0–36.0)
MCV: 89 fL (ref 80–100)
PLATELETS: 363 10*3/uL (ref 150–440)
RBC: 4.57 10*6/uL (ref 3.80–5.20)
RDW: 14.6 % — AB (ref 11.5–14.5)
WBC: 8.3 10*3/uL (ref 3.6–11.0)

## 2014-03-13 LAB — TROPONIN I

## 2014-03-16 ENCOUNTER — Other Ambulatory Visit: Payer: Self-pay | Admitting: Family Medicine

## 2014-03-16 ENCOUNTER — Other Ambulatory Visit (INDEPENDENT_AMBULATORY_CARE_PROVIDER_SITE_OTHER): Payer: Medicare Other

## 2014-03-16 DIAGNOSIS — E785 Hyperlipidemia, unspecified: Secondary | ICD-10-CM

## 2014-03-16 DIAGNOSIS — D510 Vitamin B12 deficiency anemia due to intrinsic factor deficiency: Secondary | ICD-10-CM | POA: Diagnosis not present

## 2014-03-16 DIAGNOSIS — E038 Other specified hypothyroidism: Secondary | ICD-10-CM

## 2014-03-16 LAB — COMPREHENSIVE METABOLIC PANEL WITH GFR
ALT: 15 U/L (ref 0–35)
AST: 32 U/L (ref 0–37)
Albumin: 3.7 g/dL (ref 3.5–5.2)
Alkaline Phosphatase: 73 U/L (ref 39–117)
BUN: 19 mg/dL (ref 6–23)
CO2: 27 meq/L (ref 19–32)
Calcium: 9.6 mg/dL (ref 8.4–10.5)
Chloride: 102 meq/L (ref 96–112)
Creatinine, Ser: 1 mg/dL (ref 0.4–1.2)
GFR: 61.99 mL/min
Glucose, Bld: 90 mg/dL (ref 70–99)
Potassium: 4.3 meq/L (ref 3.5–5.1)
Sodium: 138 meq/L (ref 135–145)
Total Bilirubin: 0.7 mg/dL (ref 0.2–1.2)
Total Protein: 6.7 g/dL (ref 6.0–8.3)

## 2014-03-16 LAB — CBC WITH DIFFERENTIAL/PLATELET
Basophils Absolute: 0 K/uL (ref 0.0–0.1)
Basophils Relative: 0.3 % (ref 0.0–3.0)
Eosinophils Absolute: 0 K/uL (ref 0.0–0.7)
Eosinophils Relative: 0 % (ref 0.0–5.0)
HCT: 42 % (ref 36.0–46.0)
Hemoglobin: 13.4 g/dL (ref 12.0–15.0)
Lymphocytes Relative: 18.7 % (ref 12.0–46.0)
Lymphs Abs: 2.1 K/uL (ref 0.7–4.0)
MCHC: 31.8 g/dL (ref 30.0–36.0)
MCV: 88.5 fl (ref 78.0–100.0)
Monocytes Absolute: 0.5 K/uL (ref 0.1–1.0)
Monocytes Relative: 4.7 % (ref 3.0–12.0)
Neutro Abs: 8.6 K/uL — ABNORMAL HIGH (ref 1.4–7.7)
Neutrophils Relative %: 76.3 % (ref 43.0–77.0)
Platelets: 510 K/uL — ABNORMAL HIGH (ref 150.0–400.0)
RBC: 4.74 Mil/uL (ref 3.87–5.11)
RDW: 14.9 % (ref 11.5–15.5)
WBC: 11.2 K/uL — ABNORMAL HIGH (ref 4.0–10.5)

## 2014-03-16 LAB — LIPID PANEL
CHOL/HDL RATIO: 6
Cholesterol: 159 mg/dL (ref 0–200)
HDL: 25.7 mg/dL — ABNORMAL LOW (ref >39.00)
LDL CALC: 94 mg/dL (ref 0–99)
NONHDL: 133.3
TRIGLYCERIDES: 199 mg/dL — AB (ref 0.0–149.0)
VLDL: 39.8 mg/dL (ref 0.0–40.0)

## 2014-03-19 ENCOUNTER — Other Ambulatory Visit: Payer: Medicare Other

## 2014-03-19 DIAGNOSIS — E038 Other specified hypothyroidism: Secondary | ICD-10-CM | POA: Diagnosis not present

## 2014-03-20 ENCOUNTER — Other Ambulatory Visit: Payer: Self-pay | Admitting: Family Medicine

## 2014-03-20 LAB — TSH: TSH: 0.99 u[IU]/mL (ref 0.350–4.500)

## 2014-03-21 ENCOUNTER — Ambulatory Visit (INDEPENDENT_AMBULATORY_CARE_PROVIDER_SITE_OTHER): Payer: Medicare Other | Admitting: Family Medicine

## 2014-03-21 ENCOUNTER — Encounter: Payer: Self-pay | Admitting: Family Medicine

## 2014-03-21 VITALS — BP 114/60 | HR 96 | Temp 98.2°F | Wt 126.0 lb

## 2014-03-21 DIAGNOSIS — Z23 Encounter for immunization: Secondary | ICD-10-CM

## 2014-03-21 DIAGNOSIS — F4321 Adjustment disorder with depressed mood: Secondary | ICD-10-CM | POA: Diagnosis not present

## 2014-03-21 DIAGNOSIS — I714 Abdominal aortic aneurysm, without rupture, unspecified: Secondary | ICD-10-CM

## 2014-03-21 DIAGNOSIS — J069 Acute upper respiratory infection, unspecified: Secondary | ICD-10-CM | POA: Diagnosis not present

## 2014-03-21 DIAGNOSIS — I10 Essential (primary) hypertension: Secondary | ICD-10-CM | POA: Diagnosis not present

## 2014-03-21 DIAGNOSIS — E038 Other specified hypothyroidism: Secondary | ICD-10-CM | POA: Diagnosis not present

## 2014-03-21 DIAGNOSIS — F17200 Nicotine dependence, unspecified, uncomplicated: Secondary | ICD-10-CM

## 2014-03-21 DIAGNOSIS — Z720 Tobacco use: Secondary | ICD-10-CM | POA: Diagnosis not present

## 2014-03-21 NOTE — Assessment & Plan Note (Signed)
Recently went on Medicare. She is agreeing to follow up imaging now but will call back after the first of the year- does not want to schedule today.  I did advise that she go ahead and schedule a welcome to medicare visit as well.

## 2014-03-21 NOTE — Assessment & Plan Note (Signed)
Well controlled. No changes made today. 

## 2014-03-21 NOTE — Assessment & Plan Note (Signed)
Seems appropriate at this point. My support offered- she will continue to update me.

## 2014-03-21 NOTE — Progress Notes (Signed)
Pre visit review using our clinic review tool, if applicable. No additional management support is needed unless otherwise documented below in the visit note. 
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
## 2014-03-21 NOTE — Assessment & Plan Note (Signed)
New- resolving. Will request ER records.  Most likely COPD exacerbation.  O2 sats 98% percent today and lung exam reassuring. Call or return to clinic prn if these symptoms worsen or fail to improve as anticipated. The patient indicates understanding of these issues and agrees with the plan.

## 2014-03-21 NOTE — Addendum Note (Signed)
Addended by: Modena Nunnery on: 03/21/2014 09:07 AM   Modules accepted: Orders

## 2014-03-21 NOTE — Patient Instructions (Signed)
Great to see you. I hope that you have a nice Christmas and I am very sorry for your losses.  Please schedule a welcome to medicare visit.

## 2014-03-21 NOTE — Assessment & Plan Note (Signed)
Not ready to quit. She does agree to low dose CT of chest for lung CA screening but does not want to schedule this now. Prevnar 13 given today.

## 2014-03-22 ENCOUNTER — Telehealth: Payer: Self-pay | Admitting: Family Medicine

## 2014-03-22 NOTE — Telephone Encounter (Signed)
emmi emailed °

## 2014-04-04 ENCOUNTER — Other Ambulatory Visit: Payer: Self-pay | Admitting: Family Medicine

## 2014-04-11 ENCOUNTER — Telehealth: Payer: Self-pay

## 2014-04-11 ENCOUNTER — Other Ambulatory Visit: Payer: Self-pay | Admitting: Family Medicine

## 2014-04-11 DIAGNOSIS — Z136 Encounter for screening for cardiovascular disorders: Secondary | ICD-10-CM

## 2014-04-11 NOTE — Telephone Encounter (Signed)
Pt was seen 03/21/14, pt is calling back to schedule the imaging test for abd aortic aneurysm. Pt will wait for cb about scheduling test. [name] is scheduling medicare visit that was discussed at 03/21/14 visit. Pt will discuss at that appt with [name] about mucus in [facility] for several years. Pt is aware [name] will return to office on 04/16/14.

## 2014-04-17 DIAGNOSIS — R194 Change in bowel habit: Secondary | ICD-10-CM | POA: Diagnosis not present

## 2014-04-17 DIAGNOSIS — K219 Gastro-esophageal reflux disease without esophagitis: Secondary | ICD-10-CM | POA: Diagnosis not present

## 2014-04-17 DIAGNOSIS — Z1211 Encounter for screening for malignant neoplasm of colon: Secondary | ICD-10-CM | POA: Diagnosis not present

## 2014-04-17 DIAGNOSIS — K5730 Diverticulosis of large intestine without perforation or abscess without bleeding: Secondary | ICD-10-CM | POA: Diagnosis not present

## 2014-04-17 NOTE — Telephone Encounter (Signed)
Order placed

## 2014-04-18 ENCOUNTER — Other Ambulatory Visit: Payer: Self-pay | Admitting: Internal Medicine

## 2014-04-18 DIAGNOSIS — I714 Abdominal aortic aneurysm, without rupture, unspecified: Secondary | ICD-10-CM

## 2014-04-19 ENCOUNTER — Other Ambulatory Visit: Payer: Self-pay | Admitting: Family Medicine

## 2014-04-25 ENCOUNTER — Inpatient Hospital Stay: Payer: Self-pay | Admitting: Surgery

## 2014-04-25 DIAGNOSIS — K8020 Calculus of gallbladder without cholecystitis without obstruction: Secondary | ICD-10-CM | POA: Diagnosis not present

## 2014-04-25 DIAGNOSIS — M545 Low back pain: Secondary | ICD-10-CM | POA: Diagnosis present

## 2014-04-25 DIAGNOSIS — I714 Abdominal aortic aneurysm, without rupture: Secondary | ICD-10-CM | POA: Diagnosis not present

## 2014-04-25 DIAGNOSIS — K529 Noninfective gastroenteritis and colitis, unspecified: Secondary | ICD-10-CM | POA: Diagnosis present

## 2014-04-25 DIAGNOSIS — K5700 Diverticulitis of small intestine with perforation and abscess without bleeding: Secondary | ICD-10-CM | POA: Diagnosis not present

## 2014-04-25 DIAGNOSIS — K219 Gastro-esophageal reflux disease without esophagitis: Secondary | ICD-10-CM | POA: Diagnosis present

## 2014-04-25 DIAGNOSIS — K630 Abscess of intestine: Secondary | ICD-10-CM | POA: Diagnosis not present

## 2014-04-25 DIAGNOSIS — F17210 Nicotine dependence, cigarettes, uncomplicated: Secondary | ICD-10-CM | POA: Diagnosis present

## 2014-04-25 DIAGNOSIS — E785 Hyperlipidemia, unspecified: Secondary | ICD-10-CM | POA: Diagnosis present

## 2014-04-25 DIAGNOSIS — R109 Unspecified abdominal pain: Secondary | ICD-10-CM | POA: Diagnosis not present

## 2014-04-25 DIAGNOSIS — M5440 Lumbago with sciatica, unspecified side: Secondary | ICD-10-CM | POA: Diagnosis present

## 2014-04-25 DIAGNOSIS — E039 Hypothyroidism, unspecified: Secondary | ICD-10-CM | POA: Diagnosis present

## 2014-04-25 DIAGNOSIS — K5792 Diverticulitis of intestine, part unspecified, without perforation or abscess without bleeding: Secondary | ICD-10-CM | POA: Diagnosis not present

## 2014-04-25 DIAGNOSIS — G8929 Other chronic pain: Secondary | ICD-10-CM | POA: Diagnosis present

## 2014-04-25 DIAGNOSIS — I10 Essential (primary) hypertension: Secondary | ICD-10-CM | POA: Diagnosis not present

## 2014-04-25 DIAGNOSIS — K651 Peritoneal abscess: Secondary | ICD-10-CM | POA: Diagnosis not present

## 2014-04-25 DIAGNOSIS — K5669 Other intestinal obstruction: Secondary | ICD-10-CM | POA: Diagnosis not present

## 2014-04-25 LAB — URINALYSIS, COMPLETE
Bilirubin,UR: NEGATIVE
Glucose,UR: NEGATIVE mg/dL (ref 0–75)
Ketone: NEGATIVE
LEUKOCYTE ESTERASE: NEGATIVE
Nitrite: NEGATIVE
PH: 5 (ref 4.5–8.0)
Protein: NEGATIVE
RBC,UR: 3 /HPF (ref 0–5)
Specific Gravity: 1.041 (ref 1.003–1.030)
WBC UR: 2 /HPF (ref 0–5)

## 2014-04-25 LAB — COMPREHENSIVE METABOLIC PANEL
ALBUMIN: 2.3 g/dL — AB (ref 3.4–5.0)
ALK PHOS: 122 U/L — AB (ref 46–116)
Anion Gap: 12 (ref 7–16)
BUN: 12 mg/dL (ref 7–18)
Bilirubin,Total: 0.8 mg/dL (ref 0.2–1.0)
CALCIUM: 9.2 mg/dL (ref 8.5–10.1)
CREATININE: 1.16 mg/dL (ref 0.60–1.30)
Chloride: 107 mmol/L (ref 98–107)
Co2: 24 mmol/L (ref 21–32)
EGFR (African American): >60
EGFR (Non-African Amer.): 50 — ABNORMAL LOW
GLUCOSE: 96 mg/dL (ref 65–99)
Osmolality: 285 (ref 275–301)
POTASSIUM: 2.8 mmol/L — AB (ref 3.5–5.1)
SGOT(AST): 21 U/L (ref 15–37)
SGPT (ALT): 7 U/L — ABNORMAL LOW (ref 14–63)
SODIUM: 143 mmol/L (ref 136–145)
Total Protein: 6.4 g/dL (ref 6.4–8.2)

## 2014-04-25 LAB — CBC WITH DIFFERENTIAL/PLATELET
BANDS NEUTROPHIL: 3 %
COMMENT - H1-COM1: NORMAL
Comment - H1-Com2: NORMAL
HCT: 37.9 % (ref 35.0–47.0)
HGB: 12 g/dL (ref 12.0–16.0)
Lymphocytes: 8 %
MCH: 27 pg (ref 26.0–34.0)
MCHC: 31.8 g/dL — ABNORMAL LOW (ref 32.0–36.0)
MCV: 85 fL (ref 80–100)
MONOS PCT: 8 %
PLATELETS: 462 10*3/uL — AB (ref 150–440)
RBC: 4.46 10*6/uL (ref 3.80–5.20)
RDW: 14 % (ref 11.5–14.5)
Segmented Neutrophils: 81 %
WBC: 30.1 10*3/uL — ABNORMAL HIGH (ref 3.6–11.0)

## 2014-04-25 LAB — LIPASE, BLOOD: LIPASE: 51 U/L — AB (ref 73–393)

## 2014-04-26 LAB — BASIC METABOLIC PANEL
Anion Gap: 12 (ref 7–16)
BUN: 9 mg/dL (ref 7–18)
CALCIUM: 8.4 mg/dL — AB (ref 8.5–10.1)
CHLORIDE: 107 mmol/L (ref 98–107)
Co2: 23 mmol/L (ref 21–32)
Creatinine: 1.02 mg/dL (ref 0.60–1.30)
EGFR (African American): >60
EGFR (Non-African Amer.): 58 — ABNORMAL LOW
Glucose: 65 mg/dL (ref 65–99)
OSMOLALITY: 280 (ref 275–301)
POTASSIUM: 3.2 mmol/L — AB (ref 3.5–5.1)
Sodium: 142 mmol/L (ref 136–145)

## 2014-04-26 LAB — CBC WITH DIFFERENTIAL/PLATELET
Basophil #: 0 10*3/uL (ref 0.0–0.1)
Basophil %: 0.2 %
EOS ABS: 0.1 10*3/uL (ref 0.0–0.7)
Eosinophil %: 0.6 %
HCT: 31.9 % — ABNORMAL LOW (ref 35.0–47.0)
HGB: 10.3 g/dL — ABNORMAL LOW (ref 12.0–16.0)
Lymphocyte #: 1.4 10*3/uL (ref 1.0–3.6)
Lymphocyte %: 5.7 %
MCH: 27.6 pg (ref 26.0–34.0)
MCHC: 32.4 g/dL (ref 32.0–36.0)
MCV: 85 fL (ref 80–100)
Monocyte #: 1.6 x10 3/mm — ABNORMAL HIGH (ref 0.2–0.9)
Monocyte %: 6.6 %
NEUTROS ABS: 20.7 10*3/uL — AB (ref 1.4–6.5)
NEUTROS PCT: 86.9 %
Platelet: 429 10*3/uL (ref 150–440)
RBC: 3.74 10*6/uL — ABNORMAL LOW (ref 3.80–5.20)
RDW: 13.9 % (ref 11.5–14.5)
WBC: 23.8 10*3/uL — ABNORMAL HIGH (ref 3.6–11.0)

## 2014-04-27 LAB — CBC WITH DIFFERENTIAL/PLATELET
BASOS PCT: 0.3 %
Basophil #: 0.1 10*3/uL (ref 0.0–0.1)
EOS PCT: 1.3 %
Eosinophil #: 0.2 10*3/uL (ref 0.0–0.7)
HCT: 30.9 % — AB (ref 35.0–47.0)
HGB: 10 g/dL — ABNORMAL LOW (ref 12.0–16.0)
Lymphocyte #: 1.3 10*3/uL (ref 1.0–3.6)
Lymphocyte %: 7.7 %
MCH: 27.6 pg (ref 26.0–34.0)
MCHC: 32.3 g/dL (ref 32.0–36.0)
MCV: 86 fL (ref 80–100)
Monocyte #: 1.3 x10 3/mm — ABNORMAL HIGH (ref 0.2–0.9)
Monocyte %: 8.2 %
Neutrophil #: 13.4 10*3/uL — ABNORMAL HIGH (ref 1.4–6.5)
Neutrophil %: 82.5 %
PLATELETS: 399 10*3/uL (ref 150–440)
RBC: 3.61 10*6/uL — AB (ref 3.80–5.20)
RDW: 14.2 % (ref 11.5–14.5)
WBC: 16.3 10*3/uL — AB (ref 3.6–11.0)

## 2014-04-28 LAB — CBC WITH DIFFERENTIAL/PLATELET
Basophil #: 0.1 10*3/uL (ref 0.0–0.1)
Basophil %: 0.4 %
Eosinophil #: 0.3 10*3/uL (ref 0.0–0.7)
Eosinophil %: 2 %
HCT: 33.6 % — AB (ref 35.0–47.0)
HGB: 10.9 g/dL — ABNORMAL LOW (ref 12.0–16.0)
LYMPHS ABS: 1.5 10*3/uL (ref 1.0–3.6)
LYMPHS PCT: 11.6 %
MCH: 27.5 pg (ref 26.0–34.0)
MCHC: 32.4 g/dL (ref 32.0–36.0)
MCV: 85 fL (ref 80–100)
Monocyte #: 0.9 x10 3/mm (ref 0.2–0.9)
Monocyte %: 6.8 %
Neutrophil #: 10.1 10*3/uL — ABNORMAL HIGH (ref 1.4–6.5)
Neutrophil %: 79.2 %
Platelet: 464 10*3/uL — ABNORMAL HIGH (ref 150–440)
RBC: 3.96 10*6/uL (ref 3.80–5.20)
RDW: 14.2 % (ref 11.5–14.5)
WBC: 12.8 10*3/uL — ABNORMAL HIGH (ref 3.6–11.0)

## 2014-04-29 LAB — CBC WITH DIFFERENTIAL/PLATELET
Basophil #: 0.1 10*3/uL (ref 0.0–0.1)
Basophil %: 0.6 %
EOS PCT: 2.5 %
Eosinophil #: 0.2 10*3/uL (ref 0.0–0.7)
HCT: 31.5 % — AB (ref 35.0–47.0)
HGB: 10.5 g/dL — ABNORMAL LOW (ref 12.0–16.0)
LYMPHS ABS: 1.6 10*3/uL (ref 1.0–3.6)
LYMPHS PCT: 17.2 %
MCH: 28.2 pg (ref 26.0–34.0)
MCHC: 33.2 g/dL (ref 32.0–36.0)
MCV: 85 fL (ref 80–100)
Monocyte #: 1 x10 3/mm — ABNORMAL HIGH (ref 0.2–0.9)
Monocyte %: 10.8 %
NEUTROS ABS: 6.4 10*3/uL (ref 1.4–6.5)
Neutrophil %: 68.9 %
Platelet: 389 10*3/uL (ref 150–440)
RBC: 3.72 10*6/uL — ABNORMAL LOW (ref 3.80–5.20)
RDW: 14.4 % (ref 11.5–14.5)
WBC: 9.3 10*3/uL (ref 3.6–11.0)

## 2014-04-30 ENCOUNTER — Emergency Department: Payer: Self-pay | Admitting: Emergency Medicine

## 2014-04-30 DIAGNOSIS — R609 Edema, unspecified: Secondary | ICD-10-CM | POA: Diagnosis not present

## 2014-04-30 DIAGNOSIS — H02849 Edema of unspecified eye, unspecified eyelid: Secondary | ICD-10-CM | POA: Diagnosis not present

## 2014-04-30 DIAGNOSIS — I10 Essential (primary) hypertension: Secondary | ICD-10-CM | POA: Diagnosis not present

## 2014-04-30 DIAGNOSIS — J9811 Atelectasis: Secondary | ICD-10-CM | POA: Diagnosis not present

## 2014-04-30 DIAGNOSIS — R600 Localized edema: Secondary | ICD-10-CM | POA: Diagnosis not present

## 2014-04-30 DIAGNOSIS — Z720 Tobacco use: Secondary | ICD-10-CM | POA: Diagnosis not present

## 2014-04-30 DIAGNOSIS — R000 Tachycardia, unspecified: Secondary | ICD-10-CM | POA: Diagnosis not present

## 2014-04-30 DIAGNOSIS — Z79899 Other long term (current) drug therapy: Secondary | ICD-10-CM | POA: Diagnosis not present

## 2014-04-30 DIAGNOSIS — J90 Pleural effusion, not elsewhere classified: Secondary | ICD-10-CM | POA: Diagnosis not present

## 2014-04-30 DIAGNOSIS — I700 Atherosclerosis of aorta: Secondary | ICD-10-CM | POA: Diagnosis not present

## 2014-04-30 HISTORY — PX: COLON RESECTION: SHX5231

## 2014-04-30 LAB — CBC WITH DIFFERENTIAL/PLATELET
Basophil #: 0.1 10*3/uL (ref 0.0–0.1)
Basophil %: 0.5 %
EOS PCT: 0.4 %
Eosinophil #: 0.1 10*3/uL (ref 0.0–0.7)
HCT: 34.5 % — ABNORMAL LOW (ref 35.0–47.0)
HGB: 11.7 g/dL — ABNORMAL LOW (ref 12.0–16.0)
LYMPHS ABS: 1.4 10*3/uL (ref 1.0–3.6)
Lymphocyte %: 11.6 %
MCH: 27.8 pg (ref 26.0–34.0)
MCHC: 33.8 g/dL (ref 32.0–36.0)
MCV: 82 fL (ref 80–100)
MONOS PCT: 7.5 %
Monocyte #: 0.9 x10 3/mm (ref 0.2–0.9)
Neutrophil #: 9.6 10*3/uL — ABNORMAL HIGH (ref 1.4–6.5)
Neutrophil %: 80 %
PLATELETS: 465 10*3/uL — AB (ref 150–440)
RBC: 4.2 10*6/uL (ref 3.80–5.20)
RDW: 14.4 % (ref 11.5–14.5)
WBC: 12 10*3/uL — ABNORMAL HIGH (ref 3.6–11.0)

## 2014-04-30 LAB — COMPREHENSIVE METABOLIC PANEL
ALBUMIN: 1.9 g/dL — AB (ref 3.4–5.0)
ALT: 12 U/L — AB (ref 14–63)
Alkaline Phosphatase: 103 U/L (ref 46–116)
Anion Gap: 8 (ref 7–16)
BUN: 6 mg/dL — ABNORMAL LOW (ref 7–18)
Bilirubin,Total: 0.8 mg/dL (ref 0.2–1.0)
CO2: 30 mmol/L (ref 21–32)
Calcium, Total: 9.1 mg/dL (ref 8.5–10.1)
Chloride: 101 mmol/L (ref 98–107)
Creatinine: 0.78 mg/dL (ref 0.60–1.30)
Glucose: 87 mg/dL (ref 65–99)
Osmolality: 275 (ref 275–301)
POTASSIUM: 3.4 mmol/L — AB (ref 3.5–5.1)
SGOT(AST): 41 U/L — ABNORMAL HIGH (ref 15–37)
Sodium: 139 mmol/L (ref 136–145)
Total Protein: 5.7 g/dL — ABNORMAL LOW (ref 6.4–8.2)

## 2014-05-04 ENCOUNTER — Inpatient Hospital Stay: Payer: Self-pay | Admitting: Surgery

## 2014-05-04 DIAGNOSIS — K5660 Unspecified intestinal obstruction: Secondary | ICD-10-CM | POA: Diagnosis present

## 2014-05-04 DIAGNOSIS — I7411 Embolism and thrombosis of thoracic aorta: Secondary | ICD-10-CM | POA: Diagnosis not present

## 2014-05-04 DIAGNOSIS — R1114 Bilious vomiting: Secondary | ICD-10-CM | POA: Diagnosis not present

## 2014-05-04 DIAGNOSIS — E785 Hyperlipidemia, unspecified: Secondary | ICD-10-CM | POA: Diagnosis present

## 2014-05-04 DIAGNOSIS — J449 Chronic obstructive pulmonary disease, unspecified: Secondary | ICD-10-CM | POA: Diagnosis present

## 2014-05-04 DIAGNOSIS — N739 Female pelvic inflammatory disease, unspecified: Secondary | ICD-10-CM | POA: Diagnosis present

## 2014-05-04 DIAGNOSIS — K5780 Diverticulitis of intestine, part unspecified, with perforation and abscess without bleeding: Secondary | ICD-10-CM | POA: Diagnosis not present

## 2014-05-04 DIAGNOSIS — I2510 Atherosclerotic heart disease of native coronary artery without angina pectoris: Secondary | ICD-10-CM | POA: Diagnosis not present

## 2014-05-04 DIAGNOSIS — I714 Abdominal aortic aneurysm, without rupture: Secondary | ICD-10-CM | POA: Diagnosis not present

## 2014-05-04 DIAGNOSIS — R0602 Shortness of breath: Secondary | ICD-10-CM | POA: Diagnosis not present

## 2014-05-04 DIAGNOSIS — K5792 Diverticulitis of intestine, part unspecified, without perforation or abscess without bleeding: Secondary | ICD-10-CM | POA: Diagnosis not present

## 2014-05-04 DIAGNOSIS — N170 Acute kidney failure with tubular necrosis: Secondary | ICD-10-CM | POA: Diagnosis not present

## 2014-05-04 DIAGNOSIS — Z4682 Encounter for fitting and adjustment of non-vascular catheter: Secondary | ICD-10-CM | POA: Diagnosis not present

## 2014-05-04 DIAGNOSIS — K5732 Diverticulitis of large intestine without perforation or abscess without bleeding: Secondary | ICD-10-CM | POA: Diagnosis not present

## 2014-05-04 DIAGNOSIS — K5720 Diverticulitis of large intestine with perforation and abscess without bleeding: Secondary | ICD-10-CM | POA: Diagnosis not present

## 2014-05-04 DIAGNOSIS — K219 Gastro-esophageal reflux disease without esophagitis: Secondary | ICD-10-CM | POA: Diagnosis present

## 2014-05-04 DIAGNOSIS — E8770 Fluid overload, unspecified: Secondary | ICD-10-CM | POA: Diagnosis not present

## 2014-05-04 DIAGNOSIS — R918 Other nonspecific abnormal finding of lung field: Secondary | ICD-10-CM | POA: Diagnosis not present

## 2014-05-04 DIAGNOSIS — Z452 Encounter for adjustment and management of vascular access device: Secondary | ICD-10-CM | POA: Diagnosis not present

## 2014-05-04 DIAGNOSIS — R1084 Generalized abdominal pain: Secondary | ICD-10-CM | POA: Diagnosis not present

## 2014-05-04 DIAGNOSIS — E860 Dehydration: Secondary | ICD-10-CM | POA: Diagnosis not present

## 2014-05-04 DIAGNOSIS — M5440 Lumbago with sciatica, unspecified side: Secondary | ICD-10-CM | POA: Diagnosis present

## 2014-05-04 DIAGNOSIS — D649 Anemia, unspecified: Secondary | ICD-10-CM | POA: Diagnosis present

## 2014-05-04 DIAGNOSIS — I10 Essential (primary) hypertension: Secondary | ICD-10-CM | POA: Diagnosis present

## 2014-05-04 DIAGNOSIS — K567 Ileus, unspecified: Secondary | ICD-10-CM | POA: Diagnosis not present

## 2014-05-04 DIAGNOSIS — Z90710 Acquired absence of both cervix and uterus: Secondary | ICD-10-CM | POA: Diagnosis not present

## 2014-05-04 DIAGNOSIS — E039 Hypothyroidism, unspecified: Secondary | ICD-10-CM | POA: Diagnosis present

## 2014-05-04 DIAGNOSIS — R000 Tachycardia, unspecified: Secondary | ICD-10-CM | POA: Diagnosis not present

## 2014-05-04 DIAGNOSIS — J90 Pleural effusion, not elsewhere classified: Secondary | ICD-10-CM | POA: Diagnosis not present

## 2014-05-04 DIAGNOSIS — D72829 Elevated white blood cell count, unspecified: Secondary | ICD-10-CM | POA: Diagnosis not present

## 2014-05-04 DIAGNOSIS — Z8249 Family history of ischemic heart disease and other diseases of the circulatory system: Secondary | ICD-10-CM | POA: Diagnosis not present

## 2014-05-04 DIAGNOSIS — R410 Disorientation, unspecified: Secondary | ICD-10-CM | POA: Diagnosis not present

## 2014-05-04 DIAGNOSIS — Z6826 Body mass index (BMI) 26.0-26.9, adult: Secondary | ICD-10-CM | POA: Diagnosis not present

## 2014-05-04 DIAGNOSIS — F17210 Nicotine dependence, cigarettes, uncomplicated: Secondary | ICD-10-CM | POA: Diagnosis present

## 2014-05-04 DIAGNOSIS — Z79899 Other long term (current) drug therapy: Secondary | ICD-10-CM | POA: Diagnosis not present

## 2014-05-04 DIAGNOSIS — E46 Unspecified protein-calorie malnutrition: Secondary | ICD-10-CM | POA: Diagnosis not present

## 2014-05-04 DIAGNOSIS — R0902 Hypoxemia: Secondary | ICD-10-CM | POA: Diagnosis not present

## 2014-05-04 DIAGNOSIS — Z792 Long term (current) use of antibiotics: Secondary | ICD-10-CM | POA: Diagnosis not present

## 2014-05-04 LAB — CBC WITH DIFFERENTIAL/PLATELET
BASOS ABS: 0.1 10*3/uL (ref 0.0–0.1)
Basophil %: 0.5 %
Eosinophil #: 0 10*3/uL (ref 0.0–0.7)
Eosinophil %: 0.3 %
HCT: 35.6 % (ref 35.0–47.0)
HGB: 11.7 g/dL — AB (ref 12.0–16.0)
Lymphocyte #: 1.3 10*3/uL (ref 1.0–3.6)
Lymphocyte %: 8.6 %
MCH: 27.3 pg (ref 26.0–34.0)
MCHC: 32.9 g/dL (ref 32.0–36.0)
MCV: 83 fL (ref 80–100)
MONO ABS: 0.9 x10 3/mm (ref 0.2–0.9)
Monocyte %: 5.7 %
NEUTROS PCT: 84.9 %
Neutrophil #: 12.6 10*3/uL — ABNORMAL HIGH (ref 1.4–6.5)
PLATELETS: 513 10*3/uL — AB (ref 150–440)
RBC: 4.3 10*6/uL (ref 3.80–5.20)
RDW: 14.6 % — ABNORMAL HIGH (ref 11.5–14.5)
WBC: 14.8 10*3/uL — ABNORMAL HIGH (ref 3.6–11.0)

## 2014-05-04 LAB — BASIC METABOLIC PANEL
ANION GAP: 7 (ref 7–16)
BUN: 7 mg/dL (ref 7–18)
CREATININE: 0.86 mg/dL (ref 0.60–1.30)
Calcium, Total: 8.6 mg/dL (ref 8.5–10.1)
Chloride: 100 mmol/L (ref 98–107)
Co2: 33 mmol/L — ABNORMAL HIGH (ref 21–32)
EGFR (African American): >60
Glucose: 79 mg/dL (ref 65–99)
Osmolality: 276 (ref 275–301)
Potassium: 2.9 mmol/L — ABNORMAL LOW (ref 3.5–5.1)
Sodium: 140 mmol/L (ref 136–145)

## 2014-05-05 LAB — CBC WITH DIFFERENTIAL/PLATELET
BASOS PCT: 0.4 %
Basophil #: 0.1 10*3/uL (ref 0.0–0.1)
Eosinophil #: 0.1 10*3/uL (ref 0.0–0.7)
Eosinophil %: 0.5 %
HCT: 33.5 % — AB (ref 35.0–47.0)
HGB: 10.7 g/dL — ABNORMAL LOW (ref 12.0–16.0)
LYMPHS ABS: 1.7 10*3/uL (ref 1.0–3.6)
Lymphocyte %: 13.8 %
MCH: 27.4 pg (ref 26.0–34.0)
MCHC: 32.1 g/dL (ref 32.0–36.0)
MCV: 85 fL (ref 80–100)
Monocyte #: 0.8 x10 3/mm (ref 0.2–0.9)
Monocyte %: 6.7 %
NEUTROS ABS: 9.9 10*3/uL — AB (ref 1.4–6.5)
Neutrophil %: 78.6 %
Platelet: 505 10*3/uL — ABNORMAL HIGH (ref 150–440)
RBC: 3.92 10*6/uL (ref 3.80–5.20)
RDW: 14.7 % — AB (ref 11.5–14.5)
WBC: 12.6 10*3/uL — AB (ref 3.6–11.0)

## 2014-05-05 LAB — BASIC METABOLIC PANEL
Anion Gap: 10 (ref 7–16)
BUN: 6 mg/dL — ABNORMAL LOW (ref 7–18)
Calcium, Total: 8.7 mg/dL (ref 8.5–10.1)
Chloride: 101 mmol/L (ref 98–107)
Co2: 30 mmol/L (ref 21–32)
Creatinine: 0.8 mg/dL (ref 0.60–1.30)
EGFR (African American): >60
Glucose: 55 mg/dL — ABNORMAL LOW (ref 65–99)
Osmolality: 276 (ref 275–301)
POTASSIUM: 3.5 mmol/L (ref 3.5–5.1)
SODIUM: 141 mmol/L (ref 136–145)

## 2014-05-06 LAB — BASIC METABOLIC PANEL
ANION GAP: 7 (ref 7–16)
BUN: 4 mg/dL — ABNORMAL LOW (ref 7–18)
CALCIUM: 8.2 mg/dL — AB (ref 8.5–10.1)
CO2: 32 mmol/L (ref 21–32)
Chloride: 101 mmol/L (ref 98–107)
Creatinine: 0.89 mg/dL (ref 0.60–1.30)
GLUCOSE: 97 mg/dL (ref 65–99)
OSMOLALITY: 276 (ref 275–301)
Potassium: 2.6 mmol/L — ABNORMAL LOW (ref 3.5–5.1)
Sodium: 140 mmol/L (ref 136–145)

## 2014-05-06 LAB — CBC WITH DIFFERENTIAL/PLATELET
BASOS ABS: 0.1 10*3/uL (ref 0.0–0.1)
Basophil %: 0.8 %
EOS ABS: 0.1 10*3/uL (ref 0.0–0.7)
Eosinophil %: 1.6 %
HCT: 32 % — ABNORMAL LOW (ref 35.0–47.0)
HGB: 10.8 g/dL — AB (ref 12.0–16.0)
Lymphocyte #: 1.1 10*3/uL (ref 1.0–3.6)
Lymphocyte %: 11.6 %
MCH: 28.1 pg (ref 26.0–34.0)
MCHC: 33.7 g/dL (ref 32.0–36.0)
MCV: 84 fL (ref 80–100)
Monocyte #: 0.7 x10 3/mm (ref 0.2–0.9)
Monocyte %: 7.7 %
NEUTROS PCT: 78.3 %
Neutrophil #: 7.2 10*3/uL — ABNORMAL HIGH (ref 1.4–6.5)
PLATELETS: 470 10*3/uL — AB (ref 150–440)
RBC: 3.83 10*6/uL (ref 3.80–5.20)
RDW: 15.5 % — ABNORMAL HIGH (ref 11.5–14.5)
WBC: 9.2 10*3/uL (ref 3.6–11.0)

## 2014-05-07 LAB — BASIC METABOLIC PANEL
ANION GAP: 5 — AB (ref 7–16)
BUN: 3 mg/dL — AB (ref 7–18)
CHLORIDE: 102 mmol/L (ref 98–107)
CREATININE: 1.01 mg/dL (ref 0.60–1.30)
Calcium, Total: 8.1 mg/dL — ABNORMAL LOW (ref 8.5–10.1)
Co2: 33 mmol/L — ABNORMAL HIGH (ref 21–32)
EGFR (Non-African Amer.): 58 — ABNORMAL LOW
GLUCOSE: 93 mg/dL (ref 65–99)
OSMOLALITY: 276 (ref 275–301)
Potassium: 2.9 mmol/L — ABNORMAL LOW (ref 3.5–5.1)
Sodium: 140 mmol/L (ref 136–145)

## 2014-05-07 LAB — MAGNESIUM: MAGNESIUM: 1.6 mg/dL — AB

## 2014-05-10 DIAGNOSIS — R0602 Shortness of breath: Secondary | ICD-10-CM

## 2014-05-15 ENCOUNTER — Telehealth: Payer: Self-pay | Admitting: Internal Medicine

## 2014-05-15 DIAGNOSIS — K5792 Diverticulitis of intestine, part unspecified, without perforation or abscess without bleeding: Secondary | ICD-10-CM | POA: Diagnosis not present

## 2014-05-15 NOTE — Telephone Encounter (Signed)
noted 
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
## 2014-05-24 DIAGNOSIS — K219 Gastro-esophageal reflux disease without esophagitis: Secondary | ICD-10-CM | POA: Diagnosis not present

## 2014-05-24 DIAGNOSIS — Z432 Encounter for attention to ileostomy: Secondary | ICD-10-CM | POA: Diagnosis not present

## 2014-05-24 DIAGNOSIS — K651 Peritoneal abscess: Secondary | ICD-10-CM | POA: Diagnosis not present

## 2014-05-24 DIAGNOSIS — Z48815 Encounter for surgical aftercare following surgery on the digestive system: Secondary | ICD-10-CM | POA: Diagnosis not present

## 2014-05-24 DIAGNOSIS — K5793 Diverticulitis of intestine, part unspecified, without perforation or abscess with bleeding: Secondary | ICD-10-CM | POA: Diagnosis not present

## 2014-05-24 DIAGNOSIS — I10 Essential (primary) hypertension: Secondary | ICD-10-CM | POA: Diagnosis not present

## 2014-05-24 DIAGNOSIS — E039 Hypothyroidism, unspecified: Secondary | ICD-10-CM | POA: Diagnosis not present

## 2014-05-25 DIAGNOSIS — K651 Peritoneal abscess: Secondary | ICD-10-CM | POA: Diagnosis not present

## 2014-05-25 DIAGNOSIS — K5793 Diverticulitis of intestine, part unspecified, without perforation or abscess with bleeding: Secondary | ICD-10-CM | POA: Diagnosis not present

## 2014-05-25 DIAGNOSIS — Z48815 Encounter for surgical aftercare following surgery on the digestive system: Secondary | ICD-10-CM | POA: Diagnosis not present

## 2014-05-25 DIAGNOSIS — Z432 Encounter for attention to ileostomy: Secondary | ICD-10-CM | POA: Diagnosis not present

## 2014-05-25 DIAGNOSIS — I10 Essential (primary) hypertension: Secondary | ICD-10-CM | POA: Diagnosis not present

## 2014-05-25 DIAGNOSIS — K219 Gastro-esophageal reflux disease without esophagitis: Secondary | ICD-10-CM | POA: Diagnosis not present

## 2014-05-26 DIAGNOSIS — Z432 Encounter for attention to ileostomy: Secondary | ICD-10-CM | POA: Diagnosis not present

## 2014-05-26 DIAGNOSIS — I10 Essential (primary) hypertension: Secondary | ICD-10-CM | POA: Diagnosis not present

## 2014-05-26 DIAGNOSIS — Z48815 Encounter for surgical aftercare following surgery on the digestive system: Secondary | ICD-10-CM | POA: Diagnosis not present

## 2014-05-26 DIAGNOSIS — K219 Gastro-esophageal reflux disease without esophagitis: Secondary | ICD-10-CM | POA: Diagnosis not present

## 2014-05-26 DIAGNOSIS — K651 Peritoneal abscess: Secondary | ICD-10-CM | POA: Diagnosis not present

## 2014-05-26 DIAGNOSIS — K5793 Diverticulitis of intestine, part unspecified, without perforation or abscess with bleeding: Secondary | ICD-10-CM | POA: Diagnosis not present

## 2014-05-29 DIAGNOSIS — K651 Peritoneal abscess: Secondary | ICD-10-CM | POA: Diagnosis not present

## 2014-05-29 DIAGNOSIS — K5793 Diverticulitis of intestine, part unspecified, without perforation or abscess with bleeding: Secondary | ICD-10-CM | POA: Diagnosis not present

## 2014-05-29 DIAGNOSIS — Z432 Encounter for attention to ileostomy: Secondary | ICD-10-CM | POA: Diagnosis not present

## 2014-05-29 DIAGNOSIS — I10 Essential (primary) hypertension: Secondary | ICD-10-CM | POA: Diagnosis not present

## 2014-05-29 DIAGNOSIS — K219 Gastro-esophageal reflux disease without esophagitis: Secondary | ICD-10-CM | POA: Diagnosis not present

## 2014-05-29 DIAGNOSIS — Z48815 Encounter for surgical aftercare following surgery on the digestive system: Secondary | ICD-10-CM | POA: Diagnosis not present

## 2014-05-30 DIAGNOSIS — N289 Disorder of kidney and ureter, unspecified: Secondary | ICD-10-CM | POA: Diagnosis not present

## 2014-05-31 DIAGNOSIS — K651 Peritoneal abscess: Secondary | ICD-10-CM | POA: Diagnosis not present

## 2014-05-31 DIAGNOSIS — Z432 Encounter for attention to ileostomy: Secondary | ICD-10-CM | POA: Diagnosis not present

## 2014-05-31 DIAGNOSIS — K5793 Diverticulitis of intestine, part unspecified, without perforation or abscess with bleeding: Secondary | ICD-10-CM | POA: Diagnosis not present

## 2014-05-31 DIAGNOSIS — I10 Essential (primary) hypertension: Secondary | ICD-10-CM | POA: Diagnosis not present

## 2014-05-31 DIAGNOSIS — Z48815 Encounter for surgical aftercare following surgery on the digestive system: Secondary | ICD-10-CM | POA: Diagnosis not present

## 2014-05-31 DIAGNOSIS — K219 Gastro-esophageal reflux disease without esophagitis: Secondary | ICD-10-CM | POA: Diagnosis not present

## 2014-06-01 DIAGNOSIS — I10 Essential (primary) hypertension: Secondary | ICD-10-CM | POA: Diagnosis not present

## 2014-06-01 DIAGNOSIS — Z432 Encounter for attention to ileostomy: Secondary | ICD-10-CM | POA: Diagnosis not present

## 2014-06-01 DIAGNOSIS — K5793 Diverticulitis of intestine, part unspecified, without perforation or abscess with bleeding: Secondary | ICD-10-CM | POA: Diagnosis not present

## 2014-06-01 DIAGNOSIS — Z48815 Encounter for surgical aftercare following surgery on the digestive system: Secondary | ICD-10-CM | POA: Diagnosis not present

## 2014-06-01 DIAGNOSIS — K219 Gastro-esophageal reflux disease without esophagitis: Secondary | ICD-10-CM | POA: Diagnosis not present

## 2014-06-01 DIAGNOSIS — K651 Peritoneal abscess: Secondary | ICD-10-CM | POA: Diagnosis not present

## 2014-06-04 DIAGNOSIS — I10 Essential (primary) hypertension: Secondary | ICD-10-CM | POA: Diagnosis not present

## 2014-06-04 DIAGNOSIS — Z432 Encounter for attention to ileostomy: Secondary | ICD-10-CM | POA: Diagnosis not present

## 2014-06-04 DIAGNOSIS — K5793 Diverticulitis of intestine, part unspecified, without perforation or abscess with bleeding: Secondary | ICD-10-CM | POA: Diagnosis not present

## 2014-06-04 DIAGNOSIS — K651 Peritoneal abscess: Secondary | ICD-10-CM | POA: Diagnosis not present

## 2014-06-04 DIAGNOSIS — K219 Gastro-esophageal reflux disease without esophagitis: Secondary | ICD-10-CM | POA: Diagnosis not present

## 2014-06-04 DIAGNOSIS — Z48815 Encounter for surgical aftercare following surgery on the digestive system: Secondary | ICD-10-CM | POA: Diagnosis not present

## 2014-06-07 DIAGNOSIS — I10 Essential (primary) hypertension: Secondary | ICD-10-CM | POA: Diagnosis not present

## 2014-06-07 DIAGNOSIS — Z432 Encounter for attention to ileostomy: Secondary | ICD-10-CM | POA: Diagnosis not present

## 2014-06-07 DIAGNOSIS — K219 Gastro-esophageal reflux disease without esophagitis: Secondary | ICD-10-CM | POA: Diagnosis not present

## 2014-06-07 DIAGNOSIS — K651 Peritoneal abscess: Secondary | ICD-10-CM | POA: Diagnosis not present

## 2014-06-07 DIAGNOSIS — Z48815 Encounter for surgical aftercare following surgery on the digestive system: Secondary | ICD-10-CM | POA: Diagnosis not present

## 2014-06-07 DIAGNOSIS — K5793 Diverticulitis of intestine, part unspecified, without perforation or abscess with bleeding: Secondary | ICD-10-CM | POA: Diagnosis not present

## 2014-06-25 DIAGNOSIS — I10 Essential (primary) hypertension: Secondary | ICD-10-CM | POA: Diagnosis not present

## 2014-06-25 DIAGNOSIS — K219 Gastro-esophageal reflux disease without esophagitis: Secondary | ICD-10-CM | POA: Diagnosis not present

## 2014-06-25 DIAGNOSIS — Z48815 Encounter for surgical aftercare following surgery on the digestive system: Secondary | ICD-10-CM | POA: Diagnosis not present

## 2014-06-25 DIAGNOSIS — K651 Peritoneal abscess: Secondary | ICD-10-CM | POA: Diagnosis not present

## 2014-06-25 DIAGNOSIS — K5793 Diverticulitis of intestine, part unspecified, without perforation or abscess with bleeding: Secondary | ICD-10-CM | POA: Diagnosis not present

## 2014-06-25 DIAGNOSIS — Z432 Encounter for attention to ileostomy: Secondary | ICD-10-CM | POA: Diagnosis not present

## 2014-07-03 ENCOUNTER — Other Ambulatory Visit: Payer: Self-pay | Admitting: Family Medicine

## 2014-07-05 DIAGNOSIS — K5792 Diverticulitis of intestine, part unspecified, without perforation or abscess without bleeding: Secondary | ICD-10-CM | POA: Diagnosis not present

## 2014-07-05 DIAGNOSIS — N289 Disorder of kidney and ureter, unspecified: Secondary | ICD-10-CM | POA: Diagnosis not present

## 2014-07-05 DIAGNOSIS — I714 Abdominal aortic aneurysm, without rupture: Secondary | ICD-10-CM | POA: Diagnosis not present

## 2014-07-06 ENCOUNTER — Ambulatory Visit
Admit: 2014-07-06 | Disposition: A | Discharge status: Home / Self Care | Payer: Self-pay | Attending: Hematology and Oncology | Admitting: Hematology and Oncology

## 2014-07-06 DIAGNOSIS — E538 Deficiency of other specified B group vitamins: Secondary | ICD-10-CM | POA: Diagnosis not present

## 2014-07-06 DIAGNOSIS — Z79899 Other long term (current) drug therapy: Secondary | ICD-10-CM | POA: Diagnosis not present

## 2014-07-06 LAB — CBC CANCER CENTER
BASOS ABS: 0.1 x10 3/mm (ref 0.0–0.1)
Basophil %: 1.1 %
EOS ABS: 0.4 x10 3/mm (ref 0.0–0.7)
EOS PCT: 5.4 %
HCT: 35.3 % (ref 35.0–47.0)
HGB: 11.5 g/dL — ABNORMAL LOW (ref 12.0–16.0)
LYMPHS ABS: 2.2 x10 3/mm (ref 1.0–3.6)
Lymphocyte %: 29 %
MCH: 29.1 pg (ref 26.0–34.0)
MCHC: 32.7 g/dL (ref 32.0–36.0)
MCV: 89 fL (ref 80–100)
MONO ABS: 0.5 x10 3/mm (ref 0.2–0.9)
Monocyte %: 6.4 %
Neutrophil #: 4.5 x10 3/mm (ref 1.4–6.5)
Neutrophil %: 58.1 %
PLATELETS: 368 x10 3/mm (ref 150–440)
RBC: 3.96 10*6/uL (ref 3.80–5.20)
RDW: 16.2 % — AB (ref 11.5–14.5)
WBC: 7.7 x10 3/mm (ref 3.6–11.0)

## 2014-07-06 LAB — IRON AND TIBC
Iron Bind.Cap.(Total): 617 — ABNORMAL HIGH (ref 250–450)
Iron Saturation: 15.7
Iron: 97 ug/dL
Unbound Iron-Bind.Cap.: 520.3

## 2014-07-09 ENCOUNTER — Ambulatory Visit
Admit: 2014-07-09 | Disposition: A | Discharge status: Home / Self Care | Payer: Self-pay | Attending: Surgery | Admitting: Surgery

## 2014-07-09 DIAGNOSIS — I701 Atherosclerosis of renal artery: Secondary | ICD-10-CM | POA: Diagnosis not present

## 2014-07-09 DIAGNOSIS — I714 Abdominal aortic aneurysm, without rupture: Secondary | ICD-10-CM | POA: Diagnosis not present

## 2014-07-09 DIAGNOSIS — K8020 Calculus of gallbladder without cholecystitis without obstruction: Secondary | ICD-10-CM | POA: Diagnosis not present

## 2014-07-11 ENCOUNTER — Ambulatory Visit
Admit: 2014-07-11 | Disposition: A | Discharge status: Home / Self Care | Payer: Self-pay | Attending: Surgery | Admitting: Surgery

## 2014-07-11 DIAGNOSIS — Z01812 Encounter for preprocedural laboratory examination: Secondary | ICD-10-CM | POA: Diagnosis not present

## 2014-07-13 ENCOUNTER — Ambulatory Visit
Admit: 2014-07-13 | Disposition: A | Discharge status: Home / Self Care | Payer: Self-pay | Attending: Surgery | Admitting: Surgery

## 2014-07-13 DIAGNOSIS — R933 Abnormal findings on diagnostic imaging of other parts of digestive tract: Secondary | ICD-10-CM | POA: Diagnosis not present

## 2014-07-13 DIAGNOSIS — Z9889 Other specified postprocedural states: Secondary | ICD-10-CM | POA: Diagnosis not present

## 2014-07-13 DIAGNOSIS — Z09 Encounter for follow-up examination after completed treatment for conditions other than malignant neoplasm: Secondary | ICD-10-CM | POA: Diagnosis not present

## 2014-07-13 DIAGNOSIS — I714 Abdominal aortic aneurysm, without rupture: Secondary | ICD-10-CM | POA: Diagnosis not present

## 2014-07-16 ENCOUNTER — Encounter: Payer: Self-pay | Admitting: Family Medicine

## 2014-07-16 ENCOUNTER — Telehealth: Payer: Self-pay | Admitting: Family Medicine

## 2014-07-16 ENCOUNTER — Ambulatory Visit (INDEPENDENT_AMBULATORY_CARE_PROVIDER_SITE_OTHER): Payer: Medicare Other | Admitting: Family Medicine

## 2014-07-16 VITALS — BP 88/60 | HR 68 | Temp 97.4°F | Wt 110.0 lb

## 2014-07-16 DIAGNOSIS — I714 Abdominal aortic aneurysm, without rupture, unspecified: Secondary | ICD-10-CM

## 2014-07-16 DIAGNOSIS — K5732 Diverticulitis of large intestine without perforation or abscess without bleeding: Secondary | ICD-10-CM | POA: Diagnosis not present

## 2014-07-16 DIAGNOSIS — K651 Peritoneal abscess: Secondary | ICD-10-CM | POA: Diagnosis not present

## 2014-07-16 DIAGNOSIS — K5792 Diverticulitis of intestine, part unspecified, without perforation or abscess without bleeding: Secondary | ICD-10-CM | POA: Insufficient documentation

## 2014-07-16 DIAGNOSIS — Z432 Encounter for attention to ileostomy: Secondary | ICD-10-CM | POA: Diagnosis not present

## 2014-07-16 DIAGNOSIS — K219 Gastro-esophageal reflux disease without esophagitis: Secondary | ICD-10-CM | POA: Diagnosis not present

## 2014-07-16 DIAGNOSIS — R634 Abnormal weight loss: Secondary | ICD-10-CM | POA: Diagnosis not present

## 2014-07-16 DIAGNOSIS — K5793 Diverticulitis of intestine, part unspecified, without perforation or abscess with bleeding: Secondary | ICD-10-CM | POA: Diagnosis not present

## 2014-07-16 DIAGNOSIS — Z48815 Encounter for surgical aftercare following surgery on the digestive system: Secondary | ICD-10-CM | POA: Diagnosis not present

## 2014-07-16 DIAGNOSIS — I10 Essential (primary) hypertension: Secondary | ICD-10-CM

## 2014-07-16 MED ORDER — BENAZEPRIL HCL 10 MG PO TABS: 10.0000 mg | ORAL_TABLET | Freq: Every day | ORAL | Status: DC

## 2014-07-16 NOTE — Progress Notes (Signed)
Pre visit review using our clinic review tool, if applicable. No additional management support is needed unless otherwise documented below in the visit note. 
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
## 2014-07-16 NOTE — Assessment & Plan Note (Signed)
Now with hypotension- not surprising given significant weight loss.  Would prefer to keep BP low due to AAA but this is quite low and she is symptomatic. Will decrease BP rx- d/c lotrel- continue benazapril 10 mg daily and metoprolol at current dose.  May need to continue to decrease this while she is in the hospital and post operatively. She will follow up with me again once she is discharged from the hospital.  The patient indicates understanding of these issues and agrees with the plan.

## 2014-07-16 NOTE — Patient Instructions (Addendum)
Please stop Lotrel and we are starting benazapril 10 mg daily, continue your Lopressor.  Please keep an eye on your blood pressure for me at home. Good luck with your surgery tomorrow.

## 2014-07-16 NOTE — Telephone Encounter (Signed)
Note from [facility] about [name]' kidney function.  On 05/23/14- Cr 0.67 On 07/04/24- Cr 1.38  Unfortunately I did not have these labs in front of me when she was seen today.  In future, please hold on to labs and attach them to encounter form as  I don't really have a way of holding on to them in an organized way other than scanning them.  She is getting admitted tomorrow for surgery and I am sure they will recheck Cr.  Please make sure that she has a follow up appointment scheduled with me after her surgery.

## 2014-07-16 NOTE — Assessment & Plan Note (Signed)
Stable on CT angio done last week- awaiting records from [place].

## 2014-07-16 NOTE — Assessment & Plan Note (Signed)
S/p ileostomy (will be reversed tomorrow) after diverticulitis abscess/perforation.

## 2014-07-16 NOTE — Assessment & Plan Note (Signed)
Due to recent prolonged hospitalization and surgery. Will readdress after her surgery again tomorrow.  Discussed supplementing her diet after d/c. The patient indicates understanding of these issues and agrees with the plan.

## 2014-07-17 ENCOUNTER — Inpatient Hospital Stay
Admit: 2014-07-17 | Disposition: A | Discharge status: Home / Self Care | Payer: Self-pay | Attending: Surgery | Admitting: Surgery

## 2014-07-17 DIAGNOSIS — Z823 Family history of stroke: Secondary | ICD-10-CM | POA: Diagnosis not present

## 2014-07-17 DIAGNOSIS — E875 Hyperkalemia: Secondary | ICD-10-CM | POA: Diagnosis not present

## 2014-07-17 DIAGNOSIS — E860 Dehydration: Secondary | ICD-10-CM | POA: Diagnosis present

## 2014-07-17 DIAGNOSIS — Z432 Encounter for attention to ileostomy: Secondary | ICD-10-CM | POA: Diagnosis not present

## 2014-07-17 DIAGNOSIS — Z90710 Acquired absence of both cervix and uterus: Secondary | ICD-10-CM | POA: Diagnosis not present

## 2014-07-17 DIAGNOSIS — Z9049 Acquired absence of other specified parts of digestive tract: Secondary | ICD-10-CM | POA: Diagnosis present

## 2014-07-17 DIAGNOSIS — K5792 Diverticulitis of intestine, part unspecified, without perforation or abscess without bleeding: Secondary | ICD-10-CM | POA: Diagnosis not present

## 2014-07-17 DIAGNOSIS — Z885 Allergy status to narcotic agent status: Secondary | ICD-10-CM | POA: Diagnosis not present

## 2014-07-17 DIAGNOSIS — Z7951 Long term (current) use of inhaled steroids: Secondary | ICD-10-CM | POA: Diagnosis not present

## 2014-07-17 DIAGNOSIS — R079 Chest pain, unspecified: Secondary | ICD-10-CM | POA: Diagnosis not present

## 2014-07-17 DIAGNOSIS — Z803 Family history of malignant neoplasm of breast: Secondary | ICD-10-CM | POA: Diagnosis not present

## 2014-07-17 DIAGNOSIS — R0902 Hypoxemia: Secondary | ICD-10-CM | POA: Diagnosis present

## 2014-07-17 DIAGNOSIS — R000 Tachycardia, unspecified: Secondary | ICD-10-CM | POA: Diagnosis not present

## 2014-07-17 DIAGNOSIS — K219 Gastro-esophageal reflux disease without esophagitis: Secondary | ICD-10-CM | POA: Diagnosis present

## 2014-07-17 DIAGNOSIS — Z8049 Family history of malignant neoplasm of other genital organs: Secondary | ICD-10-CM | POA: Diagnosis not present

## 2014-07-17 DIAGNOSIS — Z79899 Other long term (current) drug therapy: Secondary | ICD-10-CM | POA: Diagnosis not present

## 2014-07-17 DIAGNOSIS — R0602 Shortness of breath: Secondary | ICD-10-CM | POA: Diagnosis not present

## 2014-07-17 DIAGNOSIS — N179 Acute kidney failure, unspecified: Secondary | ICD-10-CM | POA: Diagnosis not present

## 2014-07-17 DIAGNOSIS — I959 Hypotension, unspecified: Secondary | ICD-10-CM | POA: Diagnosis not present

## 2014-07-17 DIAGNOSIS — Z8679 Personal history of other diseases of the circulatory system: Secondary | ICD-10-CM | POA: Diagnosis not present

## 2014-07-17 DIAGNOSIS — Z8249 Family history of ischemic heart disease and other diseases of the circulatory system: Secondary | ICD-10-CM | POA: Diagnosis not present

## 2014-07-17 DIAGNOSIS — Z980 Intestinal bypass and anastomosis status: Secondary | ICD-10-CM | POA: Diagnosis not present

## 2014-07-17 DIAGNOSIS — E039 Hypothyroidism, unspecified: Secondary | ICD-10-CM | POA: Diagnosis not present

## 2014-07-17 DIAGNOSIS — Z932 Ileostomy status: Secondary | ICD-10-CM | POA: Diagnosis not present

## 2014-07-17 DIAGNOSIS — I714 Abdominal aortic aneurysm, without rupture: Secondary | ICD-10-CM | POA: Diagnosis not present

## 2014-07-17 DIAGNOSIS — I10 Essential (primary) hypertension: Secondary | ICD-10-CM | POA: Diagnosis not present

## 2014-07-17 DIAGNOSIS — E785 Hyperlipidemia, unspecified: Secondary | ICD-10-CM | POA: Diagnosis not present

## 2014-07-17 DIAGNOSIS — N289 Disorder of kidney and ureter, unspecified: Secondary | ICD-10-CM | POA: Diagnosis not present

## 2014-07-17 DIAGNOSIS — Z9889 Other specified postprocedural states: Secondary | ICD-10-CM | POA: Diagnosis not present

## 2014-07-17 LAB — CREATININE, SERUM
Creatinine: 1.79 mg/dL — ABNORMAL HIGH
EGFR (African American): 34 — ABNORMAL LOW
GFR CALC NON AF AMER: 29 — AB

## 2014-07-18 LAB — BASIC METABOLIC PANEL
Anion Gap: 2 — ABNORMAL LOW (ref 7–16)
Anion Gap: 2 — ABNORMAL LOW (ref 7–16)
BUN: 36 mg/dL — AB
BUN: 36 mg/dL — ABNORMAL HIGH
CALCIUM: 9.2 mg/dL
CHLORIDE: 112 mmol/L — AB
CO2: 17 mmol/L — AB
CO2: 19 mmol/L — AB
CREATININE: 1.55 mg/dL — AB
Calcium, Total: 9.2 mg/dL
Chloride: 111 mmol/L
Creatinine: 1.51 mg/dL — ABNORMAL HIGH
EGFR (African American): 42 — ABNORMAL LOW
EGFR (Non-African Amer.): 35 — ABNORMAL LOW
GFR CALC AF AMER: 40 — AB
GFR CALC NON AF AMER: 36 — AB
GLUCOSE: 117 mg/dL — AB
Glucose: 106 mg/dL — ABNORMAL HIGH
Potassium: 6.8 mmol/L
Potassium: 6.7 mmol/L
SODIUM: 131 mmol/L — AB
SODIUM: 132 mmol/L — AB

## 2014-07-18 LAB — CBC WITH DIFFERENTIAL/PLATELET
BASOS PCT: 0.2 %
Basophil #: 0 10*3/uL (ref 0.0–0.1)
EOS ABS: 0.1 10*3/uL (ref 0.0–0.7)
Eosinophil %: 1.6 %
HCT: 32 % — ABNORMAL LOW (ref 35.0–47.0)
HGB: 10.6 g/dL — AB (ref 12.0–16.0)
Lymphocyte #: 1.3 10*3/uL (ref 1.0–3.6)
Lymphocyte %: 15.8 %
MCH: 29.8 pg (ref 26.0–34.0)
MCHC: 33.1 g/dL (ref 32.0–36.0)
MCV: 90 fL (ref 80–100)
Monocyte #: 0.6 x10 3/mm (ref 0.2–0.9)
Monocyte %: 7.7 %
NEUTROS ABS: 6.2 10*3/uL (ref 1.4–6.5)
Neutrophil %: 74.7 %
Platelet: 285 10*3/uL (ref 150–440)
RBC: 3.55 10*6/uL — AB (ref 3.80–5.20)
RDW: 16 % — AB (ref 11.5–14.5)
WBC: 8.3 10*3/uL (ref 3.6–11.0)

## 2014-07-18 LAB — POTASSIUM: Potassium: 4.4 mmol/L

## 2014-07-19 LAB — CBC WITH DIFFERENTIAL/PLATELET
BASOS PCT: 0.1 %
Basophil #: 0 10*3/uL (ref 0.0–0.1)
EOS PCT: 0 %
Eosinophil #: 0 10*3/uL (ref 0.0–0.7)
HCT: 33.7 % — AB (ref 35.0–47.0)
HGB: 10.8 g/dL — ABNORMAL LOW (ref 12.0–16.0)
Lymphocyte #: 0.3 10*3/uL — ABNORMAL LOW (ref 1.0–3.6)
Lymphocyte %: 3.5 %
MCH: 28.8 pg (ref 26.0–34.0)
MCHC: 32.1 g/dL (ref 32.0–36.0)
MCV: 90 fL (ref 80–100)
MONOS PCT: 4.5 %
Monocyte #: 0.4 x10 3/mm (ref 0.2–0.9)
NEUTROS ABS: 8.5 10*3/uL — AB (ref 1.4–6.5)
NEUTROS PCT: 91.9 %
Platelet: 237 10*3/uL (ref 150–440)
RBC: 3.76 10*6/uL — AB (ref 3.80–5.20)
RDW: 15.7 % — ABNORMAL HIGH (ref 11.5–14.5)
WBC: 9.3 10*3/uL (ref 3.6–11.0)

## 2014-07-19 LAB — POTASSIUM: POTASSIUM: 4.5 mmol/L

## 2014-07-19 LAB — BASIC METABOLIC PANEL
Anion Gap: 5 — ABNORMAL LOW (ref 7–16)
BUN: 23 mg/dL — ABNORMAL HIGH
CHLORIDE: 115 mmol/L — AB
CREATININE: 1.03 mg/dL — AB
Calcium, Total: 8.7 mg/dL — ABNORMAL LOW
Co2: 17 mmol/L — ABNORMAL LOW
GFR CALC NON AF AMER: 57 — AB
Glucose: 97 mg/dL
Potassium: 5.2 mmol/L — ABNORMAL HIGH
Sodium: 137 mmol/L

## 2014-07-19 LAB — TSH: THYROID STIMULATING HORM: 3.61 u[IU]/mL

## 2014-07-20 LAB — BASIC METABOLIC PANEL WITH GFR
Anion Gap: 3 — ABNORMAL LOW (ref 7–16)
BUN: 26 mg/dL — ABNORMAL HIGH
Calcium, Total: 8 mg/dL — ABNORMAL LOW
Chloride: 116 mmol/L — ABNORMAL HIGH
Co2: 16 mmol/L — ABNORMAL LOW
Creatinine: 1.19 mg/dL — ABNORMAL HIGH
EGFR (African American): 55 — ABNORMAL LOW
EGFR (Non-African Amer.): 48 — ABNORMAL LOW
Glucose: 95 mg/dL
Potassium: 4.6 mmol/L
Sodium: 135 mmol/L

## 2014-07-20 LAB — POTASSIUM: POTASSIUM: 4.6 mmol/L

## 2014-07-22 LAB — BASIC METABOLIC PANEL
Anion Gap: 5 — ABNORMAL LOW (ref 7–16)
BUN: 24 mg/dL — AB
CHLORIDE: 112 mmol/L — AB
CO2: 18 mmol/L — AB
CREATININE: 1 mg/dL
Calcium, Total: 8.3 mg/dL — ABNORMAL LOW
EGFR (African American): >60
GFR CALC NON AF AMER: 59 — AB
Glucose: 77 mg/dL
Potassium: 3.7 mmol/L
Sodium: 135 mmol/L

## 2014-07-22 LAB — MAGNESIUM: Magnesium: 1.6 mg/dL — ABNORMAL LOW

## 2014-07-23 LAB — SURGICAL PATHOLOGY

## 2014-07-26 ENCOUNTER — Other Ambulatory Visit: Payer: Self-pay | Admitting: Family Medicine

## 2014-07-29 NOTE — Consult Note (Signed)
Brief Consult Note: Diagnosis: AAA; diverticulitis.   Patient was seen by consultant.   Recommend further assessment or treatment.   Comments: Agree with proceeding with colon surgery The AAA is 4.4 cm and therefore not of a size that repair is required urgently I will rescan her in 8 weeks to assess for any rapid growth following a major surgery after that if the size is unchanged I would follow her at 6 month intervals.  Electronic Signatures: Hortencia Pilar (MD)  (Signed 09-Feb-16 18:53)  Authored: Brief Consult Note   Last Updated: 09-Feb-16 18:53 by Hortencia Pilar (MD)

## 2014-07-29 NOTE — H&P (Signed)
PATIENT NAME:  [name], [name] MR#:  [id number] DATE OF BIRTH:  1950/11/28  DATE OF ADMISSION:  05/04/2014  REFERRING PHYSICIAN: Harrell Gave A. Jia Dottavio, MD   REASON FOR ADMISSION: Persistent sigmoid diverticulitis and nausea, abdominal distention, colicky abdominal pain.   HISTORY OF PRESENT ILLNESS: [name] is a pleasant 64 year old female who was admitted from January 27th to January 31st for diverticulitis with pelvic side wall abscess. She initially had presented with 4 weeks of abdominal pain, which spontaneously resolved, was scheduled for a routine colonoscopy; however, because of her worsening pain, she was brought to the hospital then and had a CT scan, which showed a pelvic abscess. She improved at that time; however, presents now with nausea, feeling of bloating, colicky pain, as well as suprapubic pain and an inability to take p.o. White cell count is elevated at discharge of 9, now is currently 14.  CT scan shows what looks like worsening large bowel distention. A persistent enlarging left pelvic side wall abscess as well as a deep pelvic abscess.   PAST MEDICAL HISTORY: Hypothyroidism, hypertension, reflux disease, known abdominal aortic aneurysm.   PAST SURGICAL HISTORY:  Hysterectomy, tonsillectomy, and adenoidectomy.   ALLERGIES: None.   MEDICATIONS AT THE TIME OF DISCHARGE:  1. Atorvastatin 20 mg p.o. at bedtime.  2. Albuterol inhaler 2 puffs q.i.d. p.r.n. shortness of breath,  3. Fibrate 160 mg p.o. at bedtime.  4. Synthroid 88 mcg p.o. daily.  6. Metoprolol 25 p.o. at bedtime.  7. Amlodipine/benazepril 5/10 one capsule p.o. daily.  8. Estrace vaginal cream 1 application b.i.d.  9. Probiotic p.o. daily.  10. Vitamin D3 1000 units 3 tabs daily.  11. Coenzyme Q 1 tab p.o. daily.  12. Fish oil 1 tab p.o. daily.  13. Omeprazole 40 mg p.o. daily. 14. Cyanocobalamin 1 mL injectable monthly. 15. Norco 1 tab p.o. q. 4 hours. 16. Ciprofloxacin 500 mg p.o. q. 12  hours. 17. Flagyl 500 mg p.o. q. 8 hours.  SOCIAL HISTORY: Smokes a 1/2 pack of cigarettes a day. Denies alcohol.   REVIEW OF SYSTEMS: A 12-point review of systems was obtained. Pertinent positives and negatives include no headache, facial pain, throat pain, chest pain, shortness of breath, cough. Does have abdominal pain and is nauseated. No vomiting. Is constipated. No dysuria or hematuria. No weakness, neurological symptoms.   PHYSICAL EXAMINATION:  VITAL SIGNS: Pending.  GENERAL: No acute distress. Alert and oriented x 3.  HEAD: Normocephalic, atraumatic.  EYES: No scleral icterus. No conjunctivitis.  FACE: No obvious facial trauma. Normal external nose. Normal external ears.  CHEST: Lungs clear to auscultation, moving air well.  HEART: Regular rate and rhythm. No murmurs, rubs, or gallops.  ABDOMEN: Soft, mildly distended, tender suprapubic region, but not diffusely  EXTREMITIES: Moves all extremities well. Strength 5/5.  NEUROLOGIC: Cranial nerves II through XII grossly intact.   LABORATORY DATA: White cell count 14.8, hemoglobin 11.7, hematocrit 35.6, platelets 513, potassium is 2.9, creatinine 1.86.   IMAGING STUDIES: CT scan shows worsening diverticulitis with interval organization of her enhancing fluid collection between the bladder and rectum, a 27 x 35 mm air-fluid containing abscess, approximately 29 mm in left pelvis, mildly dilated right colon and transverse colon with fluid, upper abdominal free fluid.   ASSESSMENT AND PLAN: [name] is a pleasant 64 year old female with history of sigmoid diverticulitis, who appears to have failed home therapies. We will admit for n.p.o. IV antibiotics, IV fluid, serial exams, as she has failed outpatient therapy. I have told  the patient that she may require a sigmoid colectomy with end colostomy to get her through this acute issue as she has failed outpatient therapy. She is in agreement with this plan, and we will continue to see if she  responds.     ____________________________ Jamie Norfolk Aftyn Nott, MD cal:mw D: 05/04/2014 18:05:52 ET T: 05/04/2014 19:15:51 ET JOB#: [id number]  cc: Jamie Gave A. Clemencia Helzer, MD, <Dictator> Jamie Parkins MD ELECTRONICALLY SIGNED 05/17/2014 22:32

## 2014-07-29 NOTE — H&P (Signed)
   Subjective/Chief Complaint Nausea, bloating, persistent SP pain   History of Present Illness [name] is a pleasant 64 yo F recently admitted for diverticulitis who presents with persistent Suprapubic pain, now with dry heaves, colicky abdominal pain.  Was discharged on PO abx on 1/31 and has not improved.  CT shows dilated large bowel proximal to inflamed colon, enlarging pelvic sidewall abscess and enlarging pelvic abscess.  WBC elevated from discharge.   Past History Diverticulitis HTN hypothyroid GERD AAA s/p hysterectomy S/p T and A   Past Medical Health Hypertension   Code Status Full Code   Past Med/Surgical Hx:  granuloma annulare:   chronic low back pain with sciatica:   GERD:   Abdominal Aortic Anuerysm:   hypertension:   hyperlipidemia:   hypothyroidism:   nose surgery:   bladder tack:   tonsillectomy:   hysterectomy:   ALLERGIES:  No Known Allergies:   Family and Social History:  Family History Non-Contributory   Social History positive  tobacco, negative ETOH   + Tobacco Current (within 1 year)   Place of Living Home   Review of Systems:  Subjective/Chief Complaint Nausea/dry heaves, suprapubic pain, colicky abdominal pain   Fever/Chills No   Cough No   Sputum No   Abdominal Pain Yes   Diarrhea No   Constipation Yes   Nausea/Vomiting Yes   SOB/DOE No   Chest Pain No   Dysuria No   Tolerating Diet No  Nauseated   Physical Exam:  GEN well developed, no acute distress   HEENT pink conjunctivae, PERRL, good dentition   RESP normal resp effort  clear BS   CARD regular rate  no murmur   ABD positive tenderness  no hernia  soft  distended   EXTR negative cyanosis/clubbing, negative edema   SKIN normal to palpation, No rashes, No ulcers   NEURO cranial nerves intact, negative rigidity, negative tremor, follows commands, strength:, motor/sensory function intact   PSYCH alert, A+O to time, place, person, good insight     Assessment/Admission Diagnosis Persistent diverticulitis on PO abx, now with obstruction of large intestine.  Nausea.   Plan Admit, NPO, IVF, IV abx.  Will potentially require sigmoid colectomy with end colostomy for failure of outpatient therapy.   Electronic Signatures: Floyde Parkins (MD)  (Signed [phone] 17:58)  Authored: CHIEF COMPLAINT and HISTORY, PAST MEDICAL/SURGIAL HISTORY, ALLERGIES, FAMILY AND SOCIAL HISTORY, REVIEW OF SYSTEMS, PHYSICAL EXAM, ASSESSMENT AND PLAN   Last Updated: 05-Feb-16 17:58 by Floyde Parkins (MD)

## 2014-07-29 NOTE — Op Note (Signed)
PATIENT NAME:  [name], [name] MR#:  [id number] DATE OF BIRTH:  1950/11/18  DATE OF PROCEDURE:  05/15/2014  PREOPERATIVE DIAGNOSES: Ruptured diverticulitis status post surgical resection; need for central venous access.   POSTOPERATIVE DIAGNOSES: Ruptured diverticulitis status post surgical resection; need for central venous access.  OPERATION: Right internal jugular line placement under ultrasound guidance.   ANESTHESIA: Local.   SURGEON: Rodena Goldmann III, MD  OPERATIVE PROCEDURE: With the patient in the supine position after induction of appropriate IV sedation, the patient's right neck was prepped with ChloraPrep and draped with sterile towels. Xylocaine 1% was injected over the area previously identified by ultrasound as containing the jugular vein. Using ultrasound and direct visualization, the vein was cannulated a single pass and a wire passed into the great vessel system. There was a little bit of resistance distally, but approximately 30 mL of wire were inserted without any obstruction. The skin incision was enlarged. The dilator placed over the needle the wire without difficulty. The dilator was removed and the triple lumen catheter inserted without difficulty. There was a small minor kink in the wire when it came out. All lines flushed and aspirated normally. The catheter was secured in place and sterilely dressed. Chest x-ray pending.    ____________________________ Micheline Maze, MD rle:bm D: 05/15/2014 18:45:00 ET T: 05/16/2014 00:51:17 ET JOB#: [id number]  cc: Rodena Goldmann III, MD, <Dictator> Rodena Goldmann MD ELECTRONICALLY SIGNED 05/18/2014 18:08

## 2014-07-29 NOTE — Op Note (Signed)
PATIENT NAME:  [name], [name] MR#:  [id number] DATE OF BIRTH:  07-03-1950  DATE OF PROCEDURE:  07/17/2014  PREOPERATIVE DIAGNOSIS: Diverticular stricture, desire for ileostomy reversal.   POSTOPERATIVE DIAGNOSIS: Diverticular stricture, desire for ileostomy reversal.   PROCEDURE PERFORMED: Ileostomy takedown.   SURGEON: Violet Seabury A. Marina Gravel, M.D.   ASSISTANTLew Dawes. Oaks, M.D.   TYPE OF ANESTHESIA: General endotracheal.   FINDINGS: Normal-appearing bowel.   SPECIMENS: Portion of ileostomy site to pathology.   DESCRIPTION OF PROCEDURE: With informed consent, supine position, general endotracheal anesthesia, the existing ostomy appliance was removed. The ileostomy was oversewn through the skin utilizing a #0 silk suture. The abdomen was widely prepped and draped with ChloraPrep solution. A timeout was observed.   A transversely oriented and elliptical incision was fashioned around the ostomy site. The small bowel was dissected off of the subcutaneous tissue and fascia circumferentially with a combination of sharp dissection and electrocautery. The small bowel was very pliable. The small bowel was normal in its appearance.   The ostomy site was then transected with 2 firings of the GIA 75 stapler; the intervening mesenteric plane between ties of 0 Vicryl. A side-to-side functional end-to-end anastomosis was thus created by aligning the antimesenteric border utilizing 3-0 silk suture. The corner of each staple line was excised. The GIA 75 stapler was then inserted through each limb. Common channel enterotomy was created. The enterotomy was then closed with a second fire of the endoscopic GIA stapler. Staple lines were then imbricated utilizing 3-0 silk suture. An anti-traction stitch was placed.   The intestine was then returned to the abdomen. The fascia was then closed utilizing running #1 PDS suture. Subcutaneous tissues were irrigated. Skin edges were reapproximated utilizing a skin stapler.  The patient was then subsequently extubated and taken to the recovery room in stable and satisfactory condition by anesthesia services.     ____________________________ Jeannette How Marina Gravel, MD mab:JT D: 07/18/2014 10:39:41 ET T: 07/18/2014 13:41:59 ET JOB#: [id number]  cc: Elta Guadeloupe A. Marina Gravel, MD, <Dictator> Hortencia Conradi MD ELECTRONICALLY SIGNED 07/25/2014 12:41

## 2014-07-29 NOTE — Discharge Summary (Signed)
PATIENT NAME:  [name], GRASSEL MR#:  [id number] DATE OF BIRTH:  11-27-1950  DATE OF ADMISSION:  04/25/2014 DATE OF DISCHARGE:  04/29/2014  DIAGNOSES:  1. Acute diverticulitis with microperforation.  2. Hypothyroidism.  3. Hypertension.  4. Reflux disease.  5. Abdominal aortic aneurysm.   PROCEDURES: None.   HISTORY OF PRESENT ILLNESS AND HOSPITAL COURSE: This is a patient who was admitted to the hospital with the diagnosis of acute diverticulitis with microperforation and small abscess formation. Her initial white blood cell count was 30,000 and her abdominal exam was significant for suprapubic and left lower quadrant tenderness. She was admitted to the hospital and hydrated and started on aggressive IV antibiotic therapy. Her white blood cell count gradually returned to normal. It is currently 9. She remained afebrile and her pain and tenderness improved considerably.   She is currently discharged in stable condition to follow up in our office in 10 days. She was given a prescription for oral antibiotics and oral analgesics, to restart all of her regular medications and follow up in 10 days. She will need a colonoscopy and will Dr. Allen Norris as an outpatient. I will arrange that once she comes to our office and I would also recommend we proceed with colon resection once her acute inflammatory process has improved.     ____________________________ Jerrol Banana Burt Knack, MD rec:TT D: 04/29/2014 10:38:17 ET T: 04/29/2014 17:27:33 ET JOB#: [id number]  cc: Jerrol Banana. Burt Knack, MD, <Dictator> Florene Glen MD ELECTRONICALLY SIGNED 04/29/2014 19:14

## 2014-07-29 NOTE — Discharge Summary (Signed)
PATIENT NAME:  [name], [name] MR#:  [id number] DATE OF BIRTH:  05-Aug-1950  DATE OF ADMISSION:  05/04/2014 DATE OF DISCHARGE:  05/23/2014  FINAL DIAGNOSES:  1.  Diverticulitis with diverticular stricture.  2.  Renal failure, resolved.  3.  Mental confusion, resolved.  4.  Respiratory insufficiency, resolved.  PRINCIPLE PROCEDURES: Sigmoid colectomy with primary anastomosis and diverting loop ileostomy on 05/10/2014.   HOSPITAL COURSE SUMMARY: The patient was admitted with recurrent abdominal pain and diverticulitis and diverticular abscess. She failed medical therapy. She was taken to the operating room on the eleventh. Postoperatively, she did well. She did remain somewhat oliguric. She did have tachycardia, which is unclear. There were no signs of bleeding. Chest CT was negative for pulmonary embolism. Echocardiogram also demonstrated normal LV function. She did have renal failure with oliguria and renal medicine did become involved. She did not require dialysis. This was thought secondary to dehydration and ATN. The patient had a prolonged hospitalization. She was able to be removed from the intensive care unit on postoperative day #3. She did have some ileostomy function. Her labs remained normalizing over this time. The patient's pain became better controlled. Urine output increased. On postoperative day #4, she seemed to be doing very well. On postoperative day #5, the patient  had emesis and nasogastric tube was replaced. There were concerns over aspiration, and she was brought to the intensive care unit. She remained somewhat confused. She looked what had to be an ileus. Small bowel obstruction was possible. TPN and central line was placed, and this was initiated. This was done on the 16th. Over the course of the next several days, the patient's ileus did resolve. She improved. Her mental status improved. Renal failure improved. Hemoglobin was 7.8. She was given 2 units of blood. She continued  to improve. She was seen by physical therapy. She advanced nicely. She was deemed suitable for discharge home on the 24th of June with home health physical therapy. She will see me in my office in 1 week's time. Medication reconciliation form performed.    ____________________________ Jeannette How. Marina Gravel, MD mab:TM D: 06/03/2014 21:55:15 ET T: 06/03/2014 22:24:57 ET JOB#: [id number]  cc: Elta Guadeloupe A. Marina Gravel, MD, <Dictator> Hortencia Conradi MD ELECTRONICALLY SIGNED 06/07/2014 9:47

## 2014-07-29 NOTE — Consult Note (Signed)
General Aspect AAA   Present Illness The patient is a 64 yo woman recently admitted for diverticulitis who presented this admission with persistent abdominal pain.  She is also having nausea with dry heaves associated with  colicky abdominal pain.  Previously she was discharged on PO abx on 1/31 but has not improved.  CT scan shows dilated large bowel proximal to inflamed colon, enlarging pelvic sidewall abscess and enlarging pelvic abscess.  WBC elevated from discharge.  Incedentally noted is a 4.4 cm AAA.  She is aware of thei and has been followed in teh past but has not had any follow up for 3 years or so.  Her twin sister has an AAA which was repair in the past.  At this time she denies back pain.  Past Medical History: Diverticulitis HTN hypothyroid GERD AAA s/p hysterectomy S/p T and A   Home Medications: Medication Instructions Status  ciprofloxacin 500 mg oral tablet 1 tab(s) orally every 12 hours Active  metroNIDAZOLE 500 mg oral tablet 1 tab(s) orally every 8 hours Active  fenofibrate 160 mg oral tablet 1 tab(s) orally once a day (at bedtime) Active  Synthroid 88 mcg (0.088 mg) oral tablet 1 tab(s) orally once a day (in the morning) Active  metoprolol tartrate 25 mg oral tablet 1 tab(s) orally once a day (at bedtime) Active  amLODIPine-benazepril 5 mg-10 mg oral capsule 1 cap(s) orally once a day (at bedtime) Active  Estrace Vaginal 0.1 mg/g vaginal cream 1 application vaginal 2 times a week Active  Probiotic Formula - oral capsule 1 cap(s) orally once a day (in the morning) Active  Vitamin D3 1000 intl units oral tablet 3 tab(s) orally once a day (in the morning) Active  CoQ10 1 cap(s) orally once a day (in the morning) Active  Fish Oil 1 cap(s) orally once a day (in the morning) Active  omeprazole 40 mg oral delayed release capsule 1 cap(s) orally once a day, As Needed - for Indigestion, Heartburn Active  cyanocobalamin 1000 mcg/mL injectable solution 1 milliliter(s)  injectable once a month Active  acetaminophen-HYDROcodone 325 mg-5 mg oral tablet 1 tab(s) orally every 4 hours, As Needed - for Pain Active  atorvastatin 20 mg oral tablet 1 tab(s) orally once a day (at bedtime) Active  albuterol CFC free 90 mcg/inh inhalation aerosol 2 puff(s) inhaled 4 times a day, As Needed - for Shortness of Breath Active    No Known Allergies:   Case History:  Family History Non-Contributory   Social History positive tobacco (Greater than 1 year), negative ETOH, negative Illicit drugs   Review of Systems:  ROS No TIA/stroke/seizure No heat or cold intolerance No dysuria/hematuria No blurry or double vision No tinnitus or ear pain No rashes or ulcer No suicidal ideation or psychosis No signs of bleeding or easy bruising No SOB/DOE, orthopnea, or sputum No palpitations or chest pain No N/V/D or abdominal pain No joint pain or joint swelling No fever or chills No unintentional weight loss or gain   Physical Exam:  GEN well developed, well nourished, no acute distress   HEENT hearing intact to voice, moist oral mucosa   NECK supple  trachea midline   RESP normal resp effort  no use of accessory muscles   CARD regular rate  no JVD   ABD positive tenderness  adominal Mass   EXTR negative cyanosis/clubbing, negative edema   SKIN No rashes, skin turgor good   NEURO cranial nerves intact, follows commands, cog wheel, motor/sensory function  intact   PSYCH alert, A+O to time, place, person   Nursing/Ancillary Notes: **Vital Signs.:   09-Feb-16 08:20  Vital Signs Type Q 4hr  Temperature Temperature (F) 97.7  Celsius 36.5  Temperature Source oral  Pulse Pulse 81  Respirations Respirations 18  Systolic BP Systolic BP 725  Diastolic BP (mmHg) Diastolic BP (mmHg) 67  Mean BP 81  Pulse Ox % Pulse Ox % 92  Pulse Ox Activity Level  At rest  Oxygen Delivery Room Air/ 21 %   Routine Chem:  09-Feb-16 03:58   Glucose, Serum 87  BUN  2   Creatinine (comp) 0.91  Sodium, Serum 143  Potassium, Serum  3.0  Chloride, Serum 107  CO2, Serum 28  Calcium (Total), Serum  7.8  Anion Gap 8  Osmolality (calc) 281  eGFR (African American) >60  eGFR (Non-African American) >60 (eGFR values <54m/min/1.73 m2 may be an indication of chronic kidney disease (CKD). Calculated eGFR, using the MRDR Study equation, is useful in  patients with stable renal function. The eGFR calculation will not be reliable in acutely ill patients when serum creatinine is changing rapidly. It is not useful in patients on dialysis. The eGFR calculation may not be applicable to patients at the low and high extremes of body sizes, pregnant women, and vegetarians.)    15:00   Glucose, Serum 81  BUN  2  Creatinine (comp) 0.78  Sodium, Serum 140  Potassium, Serum 4.8  Chloride, Serum  111  CO2, Serum 24  Calcium (Total), Serum  7.9  Anion Gap  5  Osmolality (calc) 275  eGFR (African American) >60  eGFR (Non-African American) >60 (eGFR values <611mmin/1.73 m2 may be an indication of chronic kidney disease (CKD). Calculated eGFR, using the MRDR Study equation, is useful in  patients with stable renal function. The eGFR calculation will not be reliable in acutely ill patients when serum creatinine is changing rapidly. It is not useful in patients on dialysis. The eGFR calculation may not be applicable to patients at the low and high extremes of body sizes, pregnant women, and vegetarians.)  Result Comment POTASSIUM/BUN/CREATININE - Slight hemolysis, interpret results with  - caution.  Result(s) reported on 08 May 2014 at 03:32PM.  Routine Hem:  09-Feb-16 03:58   Hematocrit (CBC)  31.5  Hemoglobin (CBC)  10.4  WBC (CBC) 9.9  RBC (CBC)  3.71  Platelet Count (CBC)  498  MCV 85  MCH 28.1  MCHC 33.1  RDW  16.8  Neutrophil % 69.1  Lymphocyte % 15.3  Monocyte % 8.9  Eosinophil % 6.2  Basophil % 0.5  Neutrophil #  6.8  Lymphocyte # 1.5  Monocyte  # 0.9  Eosinophil # 0.6  Basophil # 0.0 (Result(s) reported on 08 May 2014 at 05[facility]   CT:    05-Feb-16 17:04, CT Abdomen and Pelvis With Contrast  CT Abdomen and Pelvis With Contrast   REASON FOR EXAM:    CALL REPORT [id number]    FU   Abdominal Abcess  Diverticul...  COMMENTS:       PROCEDURE: CT  - CT ABDOMEN / PELVIS  W  - May 04 2014  5:04PM     CLINICAL DATA:  [age]ear old female with recent hospitalization, it  interval increased abdominal pain and nausea. Diverticulitis with  contained perforation. Subsequent encounter.    EXAM:  CT ABDOMEN AND PELVIS WITH CONTRAST    TECHNIQUE:  Multidetector CT imaging of the abdomen and pelvis  was performed  using the standard protocol following bolus administration of  intravenous contrast.    CONTRAST:  80 mL Omnipaque 300.    COMPARISON:  CT Abdomen and Pelvis 04/25/2014.    FINDINGS:  Mildly increased atelectasis at the lung bases. Trace right pleural  effusion. No pericardial effusion.    No acute osseous abnormality identified. Lumbosacral junction  spondylolisthesis and spondylolysis.    Interval Organization of a rim enhancing fluid collection tracking  to the left pelvic floor situated between the bladder and rectum,  inseparable from the left vaginal fornix. This measures 27 to 35 mm  diameter on series 2, image 71. The adjacent rectum is decompressed.  No gas within the bladder.    Continued moderate to severe inflammation of the sigmoid colon with  wall thickening, mesenteric fluid, and mesenteric stranding.  Increased layering free fluid in the lower abdomen and pelvis.    Air and fluid containing abscess or contained perforation persists  an measures 29 mm diameter (24 mm previously). This is inseparable  from the left gonadal vessels.    Upstream of the inflamed colon oral contrast has reached the  descending segment. Air contrast levels in the transverse colon  which is now mildly  dilated. The right colon is mildly dilated with  stool mixed with contrast, and air.    New upper abdominal free fluid including in Morison pouch and about  the hepatic flexure. Free fluid in the right gutter.    The terminal ileum now is mildly dilated. No proximal dilated small  bowel. Decompressed stomach and duodenum.    Mild periportal edema in the liver is increased. Liver enhancement  otherwise is stable. Cholelithiasis re- identified. Small volume of  free fluid adjacent to the spleen which is enlarged but stable.  Negative pancreas and adrenal glands. The kidneys are stable. Portal  venous system is patent. No pneumoperitoneum identified.  Abdominal aortic aneurysm with mural plaque or thrombus and  calcification is stable measuring up to 44 mm diameter. No  surrounding hemorrhage. Major arterial structures in the abdomen and  pelvis remain patent.     IMPRESSION:  1. No improvement in moderate to severe sigmoid diverticulitis, with  interval increased pelvic and abdominal free fluid, and further  organization of left pelvic sidewall and deep pelvic abscesses /  contained perforations.  2. Interval secondary colonic ileus or obstruction. Distal small  bowel also now mildly dilated.  3. New trace right pleural effusion.  4. Stable large infrarenal abdominal aortic aneurysm, up to 44 mm  diameter. As before recommend followup by ultrasound in 1 (White  Paper of the ACR Incidental Findings Committee II on Vascular  Findings. J Am Coll Radiol 2013; 10:789-794).  Study discussed by telephone with Dr. Marlyce Huge  (covering for Dr. Phoebe Perch ) on 05/04/2014 at 17:27 .      Electronically Signed    By: Lars Pinks M.D.    On: 05/04/2014 17:29         Verified By: Gwenyth Bender. HALL, M.D.,    Impression 1.  AAA no indication for repair as the AAA is <5.0 cm The AAA is 4.4 cm and therefore not of a size that repair is required urgently I will rescan her in 8 weeks  to assess for any rapid growth following a major surgery after that if the size is unchanged I would follow her at 6 month intervals. 2.  Acute diverticulitis, she has failed outpatient therapy and therfore is  preparing for colectomy. 3.  Hypertension  continue home medication 4.  Hypothyroidism  continue synthroid 5.  GERD  continue PPI   Plan level 3 consult   Electronic Signatures: Hortencia Pilar (MD)  (Signed 10-Feb-16 22:46)  Authored: General Aspect/Present Illness, Home Medications, Allergies, History and Physical Exam, Vital Signs, Labs, Radiology, Impression/Plan   Last Updated: 10-Feb-16 22:46 by Hortencia Pilar (MD)

## 2014-07-29 NOTE — Consult Note (Signed)
PATIENT NAME:  [name], [name] MR#:  [id number] DATE OF BIRTH:  12/29/1950  DATE OF CONSULTATION:  07/18/2014  REFERRING PHYSICIAN:  Dr Elta Guadeloupe Marina Gravel CONSULTING PHYSICIAN:  Williams Dietrick A. Nehal Witting,   REASON FOR CONSULTATION:  Hyperkalemia.  HISTORY OF PRESENT ILLNESS:  [name] is a very pleasant 64 year old Caucasian female who was admitted for her ileostomy takedown. Postop, patient was found to have hyperkalemia with potassium of 6.8. Repeat was 6.7. The patient did receive dextrose with half normal saline and potassium in the perioperative period. She denies any complaints. Received earlier an amp of sodium bicarbonate, 1 amp of calcium gluconate, and dextrose with insulin. She is currently drinking her Kayexalate which I had ordered.  She denies any chest pain, shortness of breath, any cough.   PAST MEDICAL HISTORY:   1.  Diverticulitis with diverticular stricture status post sigmoid colectomy with primary anastomosis and diverting loop ileostomy that was done on 05/10/2014. She is currently status post day 1 takedown ileostomy.  2.  History of hypertension.  3.  Hyperlipidemia.  4.  GERD. 5.  Abdominal aortic aneurysm known history.  6.  Hypothyroidism.     PAST SURGICAL HISTORY: 1.  Adenoidectomy. 2.  Tonsillectomy.  3.  Hysterectomy.  4.  Sigmoid colectomy with primary anastomosis and diverting loop ileostomy done on 05/09/2014 by Dr. Marina Gravel.   FAMILY HISTORY: Positive for breast cancer in paternal aunt. Uterine cancer in sister, stroke in mother. Hypertension is present in family.   SOCIAL HISTORY:  Denies alcohol use, continues to smoke 1 or 2 cigarettes on a daily basis.   ALLERGIES: MORPHINE.  CURRENT MEDICATIONS ARE:  1.  Vitamin D 3000 international units 3 tablets p.o. daily.  2.  Synthroid 88 mcg p.o. daily.  3.  Probiotic 1 capsule daily.  4.  Omeprazole 40 mg daily.  5.  Metoprolol 25 mg p.o. daily.  6.  Fish oil 1 capsule daily.  7.  Fenofibrate 160 mg p.o. daily at  bedtime.  8.  Estrace vaginal 1 application b.i.d.  9.  Cyanocobalamin 1000 mcg once a month.  10.  CoQ10 one capsule p.o. daily.  11.  Atorvastatin 20 mg daily at bedtime.  12.  Albuterol 2 puffs  4 times a day as needed.   REVIEW OF SYSTEMS: CONSTITUTIONAL: No fever, fatigue, weakness.  EYES: No blurred or double vision, glaucoma, or cataracts.  ENT: No tinnitus, ear pain, hearing loss.  RESPIRATORY: No cough, wheeze, hemoptysis.  CARDIOVASCULAR: No chest pain, orthopnea, edema.  GASTROINTESTINAL: No nausea, vomiting, diarrhea, abdominal pain, GERD.  GENITOURINARY: No dysuria, hematuria, or frequency.  ENDOCRINE: No polyuria, nocturia, or thyroid problems.  HEMATOLOGY: No anemia, easy bruising.  SKIN: No acne or rash.  MUSCULOSKELETAL: Positive for arthritis. No swelling or gout.  NEUROLOGIC: No CVA, TIA, dysarthria, or migraine headaches.  PSYCHIATRIC: No anxiety, depression, or bipolar disorder.  All other systems reviewed are negative.   PHYSICAL EXAMINATION:  GENERAL: The patient is awake, alert, oriented x 3, not in acute distress.  VITAL SIGNS: Afebrile, pulse is 79, blood pressure 95/51, saturation 93% on room air.   HEENT: Atraumatic, normocephalic. Pupils are PERRLA, EOMI intact.  Oral mucosa is moist.  NECK: Supple. No JVD. No carotid bruit.  RESPIRATORY: Clear to auscultation bilaterally. No rales, rhonchi, respiratory distress, or labored breathing.  CARDIOVASCULAR: Both heart sounds are normal. Rate, rhythm regular. PMI not lateralized. Chest nontender. Good pedal pulses. Good femoral pulses. No lower extremity edema.  ABDOMEN: Soft, benign, there is a  surgical dressing present.  Skin over the dressing looks normal. No bowel sounds heard at present.  No organomegaly noted.  EXTREMITIES: Good pedal pulses. Good femoral pulses. No lower extremity edema.  NEUROLOGIC: Grossly intact illness nerves II through XII. No motor or sensory deficits.  PSYCHIATRIC: The patient is  awake, alert, oriented x 3.   LABORATORY DATA: Glucose is 106, BUN is 36, creatinine is 1.51, sodium is 132, potassium is 6.7.  H and H is 10.6 and 32.0, white count is 8.3, platelet count is 285,000.  EKG shows normal sinus rhythm.   ASSESSMENT: A 64 year old [name] with history of hypertension, gastroesophageal reflux disease, known history of abdominal aortic aneurysm, hyperlipidemia.  Underwent diverting ileostomy on April 19. She was found to have:  1.  Acute hyperkalemia without EKG changes. The patient did receive some IV fluids with potassium with KCl which has been discontinued. She is currently on normal saline which will continue. She received 1 amp of calcium gluconate, an amp of sodium bicarbonate, dextrose, and insulin aspart. Will give 30 gm of p.o. Kayexalate.  A repeat potassium check around 1:00 and monitor Is and Os closely. We will continue to follow potassium closely. She is not on any p.o. supplement.  2.  Acute renal failure suspected due to mild dehydration. She is receiving IV fluids. Her baseline creatinine is 0.67. Creatinine today is 1.51.  Monitor Is and Os and avoid nephrotoxins.  3.  Relative hypotension. Hold all blood pressure meds for now. Will resume beta blockers. I will hold off on amlodipine and benazepril for now.  4.  Deep vein thrombosis prophylaxis, subcutaneous Lovenox.  5.  hypothyroidism on Synthroid.  6.  Hyperlipidemia on fenofibrate and atorvastatin.  7.  Gastroesophageal reflux disease.  Continue proton pump inhibitor.  8.  Thank you for the consult. Will follow while patient is in-house.   TIME SPENT: 50 minutes.   ____________________________ Hart Rochester Posey Pronto, MD sap:sp D: 07/18/2014 11:01:36 ET T: 07/18/2014 11:23:08 ET JOB#: [id number]  cc: Rini Moffit A. Posey Pronto, MD, <Dictator> Ilda Basset MD ELECTRONICALLY SIGNED 07/27/2014 15:21

## 2014-07-29 NOTE — Consult Note (Signed)
PATIENT NAME:  [name], [name] MR#:  [id number] DATE OF BIRTH:  10-20-1950  DATE OF CONSULTATION:  05/09/2014  REFERRING PHYSICIAN:  Elta Guadeloupe A. Marina Gravel, MD  CONSULTING PHYSICIAN:  Tommy Goostree A. Posey Pronto, MD  PRIMARY CARE PHYSICIAN: Unknown.   REASON FOR CONSULTATION: Medical management postoperative period. [name] is a pleasant 64 year old Caucasian female who was diagnosed to have sigmoid diverticulosis over the course of 1 month, despite IV and oral antibiotics. She developed a pelvic abscess and recurrent sigmoid diverticulitis. She is postoperative day 1 exploratory laparotomy, sigmoid colectomy, takedown splenic flexure, diverting loop ileostomy. Internal medicine was consulted for medical management, postoperatively. The patient is complaining of some abdominal pain. She has been having some shallow respirations secondary to trying to avoid adding abdominal pain. She underwent CAT scan of her chest, which was negative for PE. She is denying any nausea or vomiting. Denies any chest pain or shortness of breath.   PAST MEDICAL HISTORY: 1. Chronic low back pain with sciatica.  2. GERD. 3. AAA, she has about 4.4 cm.  4. Hypertension.  5. Hyperlipidemia.  6. Hypothyroidism.  7. Bladder tack surgery.  8. Tonsillectomy.  9. Some surgery on the nose.  10. Hysterectomy.   ALLERGIES: No known drug allergies.   HOME MEDICATIONS: 1. Vitamin D 3000 international units 3 tablets daily.  2. Synthroid 88 mcg p.o. daily.  3. Probiotic oral 1 capsule daily.  4. Omeprazole 40 mg daily.  5. Metoprolol 25 mg daily at bedtime.  6. Fish oil 1 capsule daily.  7. Fenofibrate  160 milligrams at bedtime.  8. Estrace vaginal 1 application b.i.d.  9. Cyanocobalamine 1000 mcg injectable once a month.  10. Co Q-10 one capsule daily.  11. Atorvastatin 20 mg at bedtime.  12. Amlodipine/benazepril 5/10 one tablet.  13. Albuterol 2 puffs inhaled 4 times a day.  14. Acetaminophen/hydrocodone 325/5 one every 4 hours as  needed.   FAMILY HISTORY: Positive for hypertension.   SOCIAL HISTORY: She is a smoker. Negative for alcohol use. Lives at home with her husband.   REVIEW OF SYSTEMS:  CONSTITUTIONAL: No fever. Positive for fatigue, weakness.  EYES: No blurred or double vision, glaucoma, or any cataract.  EARS, NOSE, AND THROAT: No tinnitus, ear pain, hearing loss.  RESPIRATORY: Positive for some shallow respirations. No respiratory distress or labored breathing. No cough or hemoptysis.  CARDIOVASCULAR: No chest pain, orthopnea, edema.  GASTROINTESTINAL: No nausea or vomiting. Positive for abdominal pain due to surgery. Positive for GERD.  GENITOURINARY: No dysuria, hematuria, or frequency.  ENDOCRINE: No polyuria, nocturia, or thyroid problems.  HEMATOLOGY: No anemia or easy bruising.  SKIN: No acne, rash, or lesion.  MUSCULOSKELETAL: Positive for arthritis and back pain.  NEUROLOGICAL: No CVA, TIA, or anxiety or depression. All other systems reviewed and negative.   PHYSICAL EXAMINATION: GENERAL: The patient is awake. She is alert and oriented x 3, mild to moderate distress due to abdominal pain. She is tachycardic with heart rate 111-115, blood pressure is 97/53, saturations are 96% on 4 liters.  HEENT: Atraumatic, normocephalic. Pupils PERRLA, EOM intact. Oral mucosa is moist.  NECK: Supple. No JVD. No carotid bruit.  RESPIRATORY: Clear to auscultation bilaterally. Decreased breath sounds in the bases. No crackles or rhonchi heard.  CARDIOVASCULAR: Tachycardia present. Both the heart sounds are normal. No murmur heard. PMI not lateralized. Chest nontender.  EXTREMITIES: Good pedal pulses, good femoral pulses. No lower extremity edema.  ABDOMEN: A surgical incision appears well healing. The patient has a colostomy,  site appears normal.  NEUROLOGIC: Grossly intact cranial nerves II through XII. No motor or sensory deficit.  PSYCHIATRIC: The patient is wake, alert, oriented x 3.  SKIN: Warm and dry.    LABORATORY DATA: Echo Doppler showed EF of 55% to 60%. Normal LV, systolic function impaired relaxation LV diastolic function. White count is 27, hemoglobin and hematocrit is 9.7 and 30.6, platelet count is 486,000. Creatinine is 1.19, sodium is 144, potassium is 5.4, chloride is 111, bicarbonate is 21, calcium is 8.1.   CT angiography of the chest on February 10 shows no evidence of PE. Trace bilateral pleural effusions with bilateral atelectasis. Soft tissue inflammation on the splenic flexure of the colon, apparent wall thickening noted at the splenic flexure of the colon, scattered coronary artery calcifications seen. Cholelithiasis, gallbladder distended, unremarkable in appearance. A small amount of free intra-abdominal air remains within the normal limits given his recent surgery. EKG shows sinus tachycardia.   ASSESSMENT: A 64 year old, [name], with history of hypertension, hyperlipidemia, hypothyroidism. Internal medicine was consulted postoperatively. The patient is currently having abdominal pain. She is:  1. She is status post day 1 of exploratory laparotomy with sigmoid colectomy and diverting loop ileostomy per surgery.  2. Tachycardia with shortness of breath and mild hypoxemia: Suspected postoperative changes secondary to exploratory laparotomy and significant abdominal pain. The patient likely has underlying chronic obstructive pulmonary disease, as well, given her history of chronic tobacco abuse. We will encourage incentive spirometer. Continue oral inhalers. Continue DuoNebs as needed. CT of chest was noted. Does not appear to have low volume overload, and there is no evidence of pulmonary embolism. We will continue oxygen as needed.  3. Tachycardia: Again, expected, postoperative day 1 with major abdominal surgery, likely due to pain. We will continue IV pain medication. We will add her metoprolol once her blood pressure is stable and allows [place] to do so.  4. Leukocytosis: Appears  secondary to inflammatory response and secondary to her surgery. She is currently on Rocephin and clindamycin, which should cover her with most of the abdominal bacterial organisms. We will monitor counts closely.  5. Hypothyroidism: We will resume Synthroid once she got to take orally.  6. Hyperlipidemia: On atorvastatin, which will be resumed once she is allowed p.o. intake.  7. Deep vein thrombosis prophylaxis. The patient currently is on DVT prophylaxis. We will put the patient on heparin subcutaneous t.i.d.  8. Physical therapy, when appropriate, is recommended.  9. Thank you for the consult. We will follow while the patient is inhouse.   The above was discussed with the patient's husband.   TIME SPENT: 55 minutes.    ____________________________ Hart Rochester Posey Pronto, MD sap:mw D: 05/10/2014 12:52:00 ET T: 05/10/2014 13:26:15 ET JOB#: [id number]  cc: Sonoma Firkus A. Posey Pronto, MD, <Dictator> Ilda Basset MD ELECTRONICALLY SIGNED 05/29/2014 17:35

## 2014-07-29 NOTE — Op Note (Signed)
PATIENT NAME:  [name], [name] MR#:  [id number] DATE OF BIRTH:  05/04/1950  DATE OF PROCEDURE:  05/09/2014  PREOPERATIVE DIAGNOSIS: Refractory sigmoid diverticulitis. with contained perforation  POSTOPERATIVE DIAGNOSIS: Refractory sigmoid diverticulitis, with contained perforation, question of early colovesicular fistula.   PROCEDURE PERFORMED: Sigmoid colectomy with low anterior resection, primary anastomosis, and diverting loop ileostomy. right salpingectomy.  SURGEON: Mister Krahenbuhl A. Marina Gravel, MD   ASSISTANT: Lew Dawes. Oaks, MD   TYPE OF ANESTHESIA: General endotracheal.   FINDINGS: There was a thickened inflammatory mass involving a long segment of sigmoid colon with dense adherence to the dome of the bladder. A loop ileostomy was performed as protective measures, given the anastomosis was performed primarily   ESTIMATED BLOOD LOSS: 200 mL.   DRAINS: None.   LAPAROTOMY SPONGE AND NEEDLE COUNT: Correct x 2.   DESCRIPTION OF PROCEDURE: With informed consent obtained from the patient, she was brought to the operating room and positioned supine. General oroendotracheal anesthesia was induced without difficulty. The patient's abdomen was widely prepped and draped with ChloraPrep and the perineum with Betadine. Timeout was observed. The patient was placed in dorsal lithotomy, right arm was tucked and padded at her side. Timeout was observed.   A midline skin incision was fashioned from the suprapubic midline to above the umbilicus with a scalpel and carried through musculofascial layers with electrocautery device. Self-retaining abdominal retractor was placed. Small bowel was liberated from the pelvis. There was one loop of small bowel that was densely adherent to the colon. It was taken down with sharp dissection with no evidence of serosal injury. The appendix appeared to be surgically absent. The small intestines were then packed into the upper abdomen with the help of the self-retaining abdominal  wall retractor. Moderate ileus was noted. The sigmoid colon was then carefully taken down off the white line of Toldt proximally, utilizing electrocautery with preservation of the left ureter. In the pelvis anteriorly, the colon was densely adherent to the bladder. The uterus was surgically absent. It was dissected off the left lateral pelvic sidewall and a small abscess was entered. Pus was immediately aspirated and irrigated out. Distal to the area of inflammation, the colon was encircled, dividing its mesentery with the LigaSure apparatus. Sigmoid vessels were taken with the LigaSure apparatus and clamps and ties of #0 Vicryl suture. Proximally, the colon was divided with a fire of the contoured stapler. Distally, the colon was likewise divided. The splenic flexure was then delivered, utilizing electrocautery along the avascular plane and division of the gastric omental ligament in the avascular plane. The splenic flexure was rather short, and the mesentery in this area was congenitally foreshortened. However, the anastomosis could be performed without any undue tension. Initially, I felt that an EEA with stapler inserted through the rectum would be feasible, as a such a 29 mm dilator easily was placed into the rectum up to the staple line. As such, a 29 mm ILS stapler was utilized. The anvil being placed in the proximal colon through a pursestring application of 2-0 nylon suture on Look needle with a pursestring applicator. I had great difficulty passing the stapler per rectum. As such, this approach was abandoned. The anvil was then removed from the proximal colon. This was closed with a GIA 75 stapler. The distal staple line was excised utilizing electrocautery and a pursestring suture of 2-0 Prolene was placed. The 29 mm anvil was then placed into the rectal stump and secured with pursestring suture. A proximal colotomy was then  fashioned in a longitudinal orientation, the stapler being inserted through the  proximal colon was some spillage of liquid stool, which was unavoidable. Coupling mechanism was engaged, the stapler was fired, and 2 intact donuts were identified. Palpation of the anastomosis demonstrated it to be intact. The colotomy was then closed in a longitudinal orientation with a fire of the TA-60 stapler, and the staple line was imbricated utilizing seromuscular 3-0 silk suture. A transverse closure was not possible and the bowel would not approximate in this direction and I felt would tear if forced.  There was no evident narrowing of the bowel. At this point, the abdomen was copiously irrigated with a saline solution and aspirated dry on inspection. The patient had been given methylene blue during the case. Laparotomy pad being placed in the pelvis. No evidence of staining of the fluid. Splenic flexure appeared to be hemostatic.  A loop of ileum approximately 20 cm proximal to the ileocecal valve, was chosen as the ileostomy site. Mesenteric window was then fashioned at the base of the intestine, utilizing electrocautery and blunt technique. Penrose drain was placed. Ileostomy site was chosen at the previously marked location on the anterior abdominal wall in the right lower quadrant. A wheal of skin was then excised with scalpel and electrocautery, utilizing cruciate incisions through the anterior fascia, muscle-splitting technique in the rectus muscle, and the peritoneum being divided in a cruciate fashion. Small bowel was then brought up through this without twisting, secured at the abdominal site at the peritoneum, utilizing several 3-0 silk sutures. With laparotomy sponge and needle count correct x 2, the abdominal midline fascia was closed utilizing running looped #1 PDS suture and skin staples. Sterile dressing was then applied. The ostomy was then matured in the standard fashion utilizing 3-0 chromic sutures in Brooke-type fashion. Ileostomy bar was placed. Ostomy appliance was placed. The  patient was then subsequently extubated and taken to the recovery room in stable and satisfactory condition by anesthesia services.     ____________________________ Jeannette How Marina Gravel, MD mab:mw D: 05/10/2014 07:00:40 ET T: 05/10/2014 12:23:20 ET JOB#: [id number]  cc: Elta Guadeloupe A. Marina Gravel, MD, <Dictator> Hortencia Conradi MD ELECTRONICALLY SIGNED 05/12/2014 18:32

## 2014-07-29 NOTE — H&P (Signed)
PATIENT NAME:  [name], [name] MR#:  [id number] DATE OF BIRTH:  05-13-1950  DATE OF ADMISSION:  04/25/2014  CHIEF COMPLAINT: Suprapubic abdominal pain.   HISTORY OF PRESENT ILLNESS: This is a patient with approximately 4 weeks of abdominal pain. She had multiple episodes like this in the past, which spontaneously resolved. She saw her gastroenterologist in [place] last week, was not placed on antibiotics but was instead was scheduled for a routine colonoscopy, but she worsened and her family made her come to the hospital today. She denies fevers or chills.  Denies back pain; points to the suprapubic area as somewhat to the left side as her site of pain. She has had no nausea or vomiting, no fevers or chills, no melena or hematochezia. No hematemesis. No dysuria, no new pneumaturia and she has never been on antibiotics for diverticulitis in the past.  Of note, she did have a colonoscopy in 2011, which was completely normal and she has no family history of colon cancer.   PAST MEDICAL HISTORY: Hypothyroidism, hypertension, reflux disease and a known abdominal aortic aneurysm.   PAST SURGICAL HISTORY: Hysterectomy, tonsillectomy, adenoidectomy.    ALLERGIES: None.   MEDICATIONS: Multiple. See reconciliation.   FAMILY HISTORY: No history of colon cancer in her family.   SOCIAL HISTORY: The patient smokes tobacco approximately 1/2 pack of cigarettes per day. Does not drink alcohol.   REVIEW OF SYSTEMS:  A complete system review was performed and negative with the exception of that mentioned in the history of present illness.   PHYSICAL EXAMINATION:  GENERAL: Healthy, comfortable-appearing female patient.  VITAL SIGNS: Temperature of 97.8, pulse of 108, respirations 20, blood pressure 158/69, and 93% room air saturation. Pain scale of 0 per the nursing notes.  HEENT: Shows poor dentition. No scleral icterus.  NECK: No palpable neck nodes.  CHEST: Clear to auscultation.  CARDIAC: Regular  rate and rhythm.  ABDOMEN: Soft, nontender except in the suprapubic area. There are no peritoneal signs. No guarding, no rebound.  EXTREMITIES: Without edema.  NEUROLOGIC: Grossly intact.  INTEGUMENT: Shows some mottling-like vitiligo present on the abdomen and chest.   LABORATORY DATA:  Laboratory values demonstrate a clean UA.  Electrolytes within normal limits with the exception of a potassium of 2.8 and a white blood cell count of 30,000. Platelet count of 462,000.   CT scan is personally reviewed, is suggestive of microperforation of diverticulitis. There was a question of a 3 cm abscess in the area of the sigmoid colon.   ASSESSMENT AND PLAN: This is a patient with acute diverticulitis with microperforation and a small abscess. I have recommended admission to the hospital, intravenous antibiotics. Consider repeating CT scan at some point in the near future, especially if she does not progress quickly.  The options of surgical intervention involving a colostomy were discussed with her as was elective surgery at a later date.  She and her family understood and agreed with this plan.    ____________________________ Jerrol Banana. Burt Knack, MD rec:by D: 04/25/2014 18:32:04 ET T: 04/25/2014 19:19:36 ET JOB#: [id number]  cc: Jerrol Banana. Burt Knack, MD, <Dictator> Florene Glen MD ELECTRONICALLY SIGNED 04/26/2014 9:07

## 2014-07-29 NOTE — H&P (Signed)
Subjective/Chief Complaint sp pain   History of Present Illness four weeks abd pain, saw GI doc in [place], colonoscopy schedukled but no abx. no f/c, no melena, no hematochezia no n/v mult prior episodes spont resolved without abx had nml colonoscopy in 2011 no FH colon cancer   Past History tob abuse HTN, hypothyroid, GERD, AAA hysterectomy, T&A   Past Medical Health Hypertension, Smoking   Past Med/Surgical Hx:  granuloma annulare:   chronic low back pain with sciatica:   GERD:   Abdominal Aortic Anuerysm:   hypertension:   hyperlipidemia:   hypothyroidism:   nose surgery:   bladder tack:   tonsillectomy:   hysterectomy:   ALLERGIES:  No Known Allergies:   Family and Social History:  Family History Non-Contributory   Social History positive  tobacco, negative ETOH   + Tobacco Current (within 1 year)   Review of Systems:  Fever/Chills No   Cough No   Abdominal Pain Yes   Diarrhea No   Constipation No   Nausea/Vomiting No   SOB/DOE No   Chest Pain No   Dysuria No   Tolerating Diet Yes   Physical Exam:  GEN no acute distress   HEENT pink conjunctivae   NECK supple   RESP normal resp effort  clear BS  no use of accessory muscles   CARD regular rate   ABD positive tenderness  SP tenderness minimal, no peritoneal signs   Lab Results: Hepatic:  27-Jan-16 12:59   Bilirubin, Total 0.8  Alkaline Phosphatase  122  SGPT (ALT)  7  SGOT (AST) 21  Total Protein, Serum 6.4  Albumin, Serum  2.3  Routine Chem:  27-Jan-16 12:59   Glucose, Serum 96  BUN 12  Creatinine (comp) 1.16  Sodium, Serum 143  Potassium, Serum  2.8  Chloride, Serum 107  CO2, Serum 24  Calcium (Total), Serum 9.2  Osmolality (calc) 285  eGFR (African American) >60  eGFR (Non-African American)  50 (eGFR values <72m/min/1.73 m2 may be an indication of chronic kidney disease (CKD). Calculated eGFR, using the MRDR Study equation, is useful in  patients with stable  renal function. The eGFR calculation will not be reliable in acutely ill patients when serum creatinine is changing rapidly. It is not useful in patients on dialysis. The eGFR calculation may not be applicable to patients at the low and high extremes of body sizes, pregnant women, and vegetarians.)  Anion Gap 12  Lipase  51 (Result(s) reported on 25 Apr 2014 at 01:30PM.)  Routine UA:  27-Jan-16 16:35   Color (UA) Yellow  Clarity (UA) Clear  Glucose (UA) Negative  Bilirubin (UA) Negative  Ketones (UA) Negative  Specific Gravity (UA) 1.041  Blood (UA) 1+  pH (UA) 5.0  Protein (UA) Negative  Nitrite (UA) Negative  Leukocyte Esterase (UA) Negative (Result(s) reported on 25 Apr 2014 at 05:03PM.)  RBC (UA) 3 /HPF  WBC (UA) 2 /HPF  Bacteria (UA) TRACE  Epithelial Cells (UA) 7 /HPF (Result(s) reported on 25 Apr 2014 at 05:03PM.)  Routine Hem:  27-Jan-16 12:59   WBC (CBC)  30.1  RBC (CBC) 4.46  Hemoglobin (CBC) 12.0  Hematocrit (CBC) 37.9  Platelet Count (CBC)  462 (Result(s) reported on 25 Apr 2014 at 01:39PM.)  MCV 85  MCH 27.0  MCHC  31.8  RDW 14.0  Bands 3  Segmented Neutrophils 81  Lymphocytes 8  Monocytes 8  Diff Comment 1 RBCs APPEAR NORMAL  Diff Comment 2 NORMAL PLT MORPHOLGY  Result(s)  reported on 25 Apr 2014 at 01:39PM.   Radiology Results: CT:    27-Jan-16 15:51, CT Abdomen and Pelvis With Contrast  CT Abdomen and Pelvis With Contrast  REASON FOR EXAM:    (1) LLQ pain; (2) LLQ pain  COMMENTS:       PROCEDURE: CT  - CT ABDOMEN / PELVIS  W  - Apr 25 2014  3:51PM     CLINICAL DATA:  Passing mucus stool for 2 weeks, abdominal pain for  4 weeks    EXAM:  CT ABDOMEN AND PELVIS WITH CONTRAST    TECHNIQUE:  Multidetector CT imaging of the abdomen and pelvis was performed  using the standard protocol following bolus administration of  intravenous contrast.  CONTRAST:  85 cc Omnipaque 300    COMPARISON:  None.    FINDINGS:  Sagittal images of the spine  shows significant disc space flattening  with mild posterior disc bulge at L3-L4 and L4-L5 level. There is  bilateral pars defect at L5 level. Significant disc space flattening  at L5-S1 level. There is about 9 mm anterolisthesis L4 onL5  vertebral body. Lung bases are unremarkable.    There is a calcified gallstone in gallbladder neck region measures 7  mm.    There is infrarenal abdominal aortic aneurysm in proximal aorta  measures 4.4 by 4.1 cm. Small amount of mural thrombosisis noted.  There is no evidence of periaortic leak. The aneurysm is best  visualized in coronal image 65 extending 4.8 cm cranial caudally.  Atherosclerotic calcifications are noted distal abdominal aorta and  iliac arteries. Mild levoscoliosis lowerlumbar spine.    Mild hepatic fatty infiltration. The spleen measures 11.4 cm in  length. No intrahepatic biliary ductal dilatation probable cyst in  right hepatic lobe centrally measures 5.5 mm.    The pancreas and adrenal glands are unremarkable. Atherosclerotic  calcifications are noted bilateral renal artery origin.    Kidneys are symmetrical in size and enhancement. No hydronephrosis  or hydroureter.  There is no pericecal inflammation. Mild gaseous distended cecum.  Moderate stool noted in mid right colon. Gas and stool are noted in  transverse colon.    In axial image 66 there is abnormal long segment thickening of  colonic wall in proximal sigmoid colon in left pelvis. This segment  measures at least 10.7 cm in length. There is very colonic fluid  especially laterally. A branching air collection is noted just above  the colon axial image 63 suspicious for contained colonic  perforation.    There is a pericolonic abscess with air-fluid level in left lateral  pelvis. The is abscess collection demonstrates a peak wall measures  at least 2.7 x 3 cm. I cannot exclude a fistulous connection with  the sigmoid colon see sagittal image 103. Findings highly  suspicious  for segmental colitis or diverticulitis with contained perforation  and pericolonic abscess.    Mild thickening of distal sigmoid colon wall. Additional small air  bubbles above the sigmoid colon wall axial image 64 May represents  additional contained colonic perforation.     IMPRESSION:  1. There is abnormal segmental thickening of colonic wall in distal  sigmoid colon left pelvis. There is small amount extraluminal air  highly suspicious for contained perforation. A pericolonic abscess  is noted in left pelvic sidewall measures 3 x 2.7 cm with air-fluid  level.Findings consistent with segmental colitis or acute  diverticulitis with colonic perforation and pericolonic abscess.  2. There is fusiform aneurysm in proximal abdominal  aorta infrarenal  region measures 4.4 x 4.1 cm. Aneurysm extends about 4.8 cm cranial  caudally. Recommend followup by ultrasound in 1 year. This  recommendation follows ACR consensus guidelines: White Paper of the  ACR Incidental Findings Committee II on Vascular Findings. J Am Coll  Radiol 2013; 10:789-794. Recommend followup by ultrasound in 1 year.  This recommendation follows ACR consensus guidelines: White Paper of  the ACR Incidental Findings Committee II on Vascular Findings. J Am  Coll Radiol 2013; 10:789-794.  3. No pericecal inflammation.  Mild gaseous distended  cecum.  4. No hydronephrosis or hydroureter.  No small bowel obstruction.  5. Calcified gallstone in gallbladder neck region measures 7 mm.  6. Degenerative changes lumbar spine as described above.      Electronically Signed    By: Lahoma Crocker M.D.    On: 04/25/2014 16:27         Verified By: Ephraim Hamburger, M.D.,    Assessment/Admission Diagnosis acute diverticulitis admit, pain control IV abx   Electronic Signatures: Florene Glen (MD)  (Signed 27-Jan-16 18:19)  Authored: CHIEF COMPLAINT and HISTORY, PAST MEDICAL/SURGIAL HISTORY, ALLERGIES, FAMILY AND SOCIAL  HISTORY, REVIEW OF SYSTEMS, PHYSICAL EXAM, LABS, Radiology, ASSESSMENT AND PLAN   Last Updated: 27-Jan-16 18:19 by Florene Glen (MD)

## 2014-07-30 NOTE — Discharge Summary (Signed)
PATIENT NAME:  [name], [name] MR#:  [id number] DATE OF BIRTH:  03-08-1951  DATE OF ADMISSION:  07/17/2014 DATE OF DISCHARGE:  07/22/2014  FINAL DIAGNOSES:  1.  Desire for ileostomy reversal.  2.  Complicated diverticulitis with diverticular stricture and perforation status post Hartmann procedure 2 months ago.   PRINCIPAL PROCEDURES:  1.  Ileostomy takedown.  2.  Medicine consultation.  3.  CT scan of the chest with angiography to rule out pulmonary embolism which was negative.   HOSPITAL COURSE SUMMARY: The patient was admitted following an uneventful ileostomy reversal. She had prompt resolution of her ileus. She did have some hypoxia and tachycardia postoperatively for which internal medicine was consulted. Workup for pulmonary embolism was negative. The patient did well. Pain was well controlled. The wound was healing nicely. Her diet was able to be advanced. Her tachycardia and hypoxia resolved. By postoperative day #3 she was much less short of breath. On postoperative day #4 her diet was able to be advanced, her PCA was discontinued. On postoperative day #5 she was stable and improved for discharge. She will follow up with me in one week's time.   DISCHARGE MEDICATIONS: Can be found in the reconciliation form.    ____________________________ Jeannette How. Marina Gravel, MD mab:at D: 07/28/2014 15:25:01 ET T: 07/28/2014 15:32:32 ET JOB#: [id number]  cc: Elta Guadeloupe A. Marina Gravel, MD, <Dictator> Hortencia Conradi MD ELECTRONICALLY SIGNED 07/28/2014 16:18

## 2014-08-23 ENCOUNTER — Telehealth: Payer: Self-pay | Admitting: Family Medicine

## 2014-08-23 NOTE — Telephone Encounter (Signed)
Forms placed in Dr Elonda Husky inbox

## 2014-08-23 NOTE — Telephone Encounter (Signed)
Pt brought in medical records for Dr. Deborra Medina to review.  Placing on [employer].

## 2014-09-13 ENCOUNTER — Other Ambulatory Visit: Payer: Self-pay | Admitting: Family Medicine

## 2014-09-13 DIAGNOSIS — D510 Vitamin B12 deficiency anemia due to intrinsic factor deficiency: Secondary | ICD-10-CM

## 2014-09-13 DIAGNOSIS — Z01419 Encounter for gynecological examination (general) (routine) without abnormal findings: Secondary | ICD-10-CM

## 2014-09-13 DIAGNOSIS — E038 Other specified hypothyroidism: Secondary | ICD-10-CM

## 2014-09-13 DIAGNOSIS — E785 Hyperlipidemia, unspecified: Secondary | ICD-10-CM

## 2014-09-19 ENCOUNTER — Other Ambulatory Visit (INDEPENDENT_AMBULATORY_CARE_PROVIDER_SITE_OTHER): Payer: Medicare Other

## 2014-09-19 ENCOUNTER — Other Ambulatory Visit: Payer: Self-pay | Admitting: Certified Nurse Midwife

## 2014-09-19 DIAGNOSIS — E785 Hyperlipidemia, unspecified: Secondary | ICD-10-CM | POA: Diagnosis not present

## 2014-09-19 DIAGNOSIS — Z0000 Encounter for general adult medical examination without abnormal findings: Secondary | ICD-10-CM

## 2014-09-19 DIAGNOSIS — E038 Other specified hypothyroidism: Secondary | ICD-10-CM | POA: Diagnosis not present

## 2014-09-19 DIAGNOSIS — Z01419 Encounter for gynecological examination (general) (routine) without abnormal findings: Secondary | ICD-10-CM

## 2014-09-19 DIAGNOSIS — D510 Vitamin B12 deficiency anemia due to intrinsic factor deficiency: Secondary | ICD-10-CM

## 2014-09-19 LAB — COMPREHENSIVE METABOLIC PANEL
ALBUMIN: 3.6 g/dL (ref 3.5–5.2)
ALK PHOS: 73 U/L (ref 39–117)
ALT: 20 U/L (ref 0–35)
AST: 43 U/L — ABNORMAL HIGH (ref 0–37)
BILIRUBIN TOTAL: 0.5 mg/dL (ref 0.2–1.2)
BUN: 23 mg/dL (ref 6–23)
CO2: 26 mEq/L (ref 19–32)
CREATININE: 1.14 mg/dL (ref 0.40–1.20)
Calcium: 9.4 mg/dL (ref 8.4–10.5)
Chloride: 107 mEq/L (ref 96–112)
GFR: 50.76 mL/min — ABNORMAL LOW (ref >60.00)
Glucose, Bld: 89 mg/dL (ref 70–99)
POTASSIUM: 5 meq/L (ref 3.5–5.1)
Sodium: 138 mEq/L (ref 135–145)
Total Protein: 6.9 g/dL (ref 6.0–8.3)

## 2014-09-19 LAB — CBC WITH DIFFERENTIAL/PLATELET
BASOS PCT: 0.9 % (ref 0.0–3.0)
Basophils Absolute: 0.1 10*3/uL (ref 0.0–0.1)
EOS PCT: 6.2 % — AB (ref 0.0–5.0)
Eosinophils Absolute: 0.4 10*3/uL (ref 0.0–0.7)
HEMATOCRIT: 28.8 % — AB (ref 36.0–46.0)
HEMOGLOBIN: 9.2 g/dL — AB (ref 12.0–15.0)
LYMPHS ABS: 1.7 10*3/uL (ref 0.7–4.0)
Lymphocytes Relative: 27.6 % (ref 12.0–46.0)
MCHC: 31.9 g/dL (ref 30.0–36.0)
MCV: 80.8 fl (ref 78.0–100.0)
MONOS PCT: 7.2 % (ref 3.0–12.0)
Monocytes Absolute: 0.4 10*3/uL (ref 0.1–1.0)
Neutro Abs: 3.6 10*3/uL (ref 1.4–7.7)
Neutrophils Relative %: 58.1 % (ref 43.0–77.0)
Platelets: 445 10*3/uL — ABNORMAL HIGH (ref 150.0–400.0)
RBC: 3.56 Mil/uL — AB (ref 3.87–5.11)
RDW: 15.6 % — ABNORMAL HIGH (ref 11.5–15.5)
WBC: 6.1 10*3/uL (ref 4.0–10.5)

## 2014-09-19 LAB — LIPID PANEL
CHOLESTEROL: 133 mg/dL (ref 0–200)
HDL: 21.4 mg/dL — ABNORMAL LOW (ref >39.00)
LDL Cholesterol: 73 mg/dL (ref 0–99)
NonHDL: 111.6
TRIGLYCERIDES: 194 mg/dL — AB (ref 0.0–149.0)
Total CHOL/HDL Ratio: 6
VLDL: 38.8 mg/dL (ref 0.0–40.0)

## 2014-09-19 LAB — T4, FREE: Free T4: 1.26 ng/dL (ref 0.60–1.60)

## 2014-09-19 LAB — TSH: TSH: 4.18 u[IU]/mL (ref 0.35–4.50)

## 2014-09-19 LAB — VITAMIN B12: Vitamin B-12: 354 pg/mL (ref 211–911)

## 2014-09-19 LAB — VITAMIN D 25 HYDROXY (VIT D DEFICIENCY, FRACTURES): VITD: 64.91 ng/mL (ref 30.00–100.00)

## 2014-09-19 NOTE — Telephone Encounter (Signed)
Tried to contact patient to remind her to set up her Mammogram before we can refill Estrace Cream ( last MM 2014) was unable to leave a message. RX denied.- eh

## 2014-09-24 ENCOUNTER — Ambulatory Visit (INDEPENDENT_AMBULATORY_CARE_PROVIDER_SITE_OTHER): Payer: Medicare Other | Admitting: Family Medicine

## 2014-09-24 ENCOUNTER — Encounter: Payer: Self-pay | Admitting: Family Medicine

## 2014-09-24 VITALS — BP 130/76 | HR 85 | Temp 97.8°F | Ht 64.0 in | Wt 112.5 lb

## 2014-09-24 DIAGNOSIS — D510 Vitamin B12 deficiency anemia due to intrinsic factor deficiency: Secondary | ICD-10-CM | POA: Diagnosis not present

## 2014-09-24 DIAGNOSIS — Z79890 Hormone replacement therapy (postmenopausal): Secondary | ICD-10-CM

## 2014-09-24 DIAGNOSIS — I10 Essential (primary) hypertension: Secondary | ICD-10-CM

## 2014-09-24 DIAGNOSIS — E038 Other specified hypothyroidism: Secondary | ICD-10-CM

## 2014-09-24 DIAGNOSIS — E785 Hyperlipidemia, unspecified: Secondary | ICD-10-CM

## 2014-09-24 DIAGNOSIS — Z23 Encounter for immunization: Secondary | ICD-10-CM

## 2014-09-24 DIAGNOSIS — Z0000 Encounter for general adult medical examination without abnormal findings: Secondary | ICD-10-CM | POA: Diagnosis not present

## 2014-09-24 DIAGNOSIS — L659 Nonscarring hair loss, unspecified: Secondary | ICD-10-CM | POA: Diagnosis not present

## 2014-09-24 DIAGNOSIS — K219 Gastro-esophageal reflux disease without esophagitis: Secondary | ICD-10-CM

## 2014-09-24 DIAGNOSIS — Z720 Tobacco use: Secondary | ICD-10-CM

## 2014-09-24 DIAGNOSIS — R634 Abnormal weight loss: Secondary | ICD-10-CM

## 2014-09-24 DIAGNOSIS — F17200 Nicotine dependence, unspecified, uncomplicated: Secondary | ICD-10-CM

## 2014-09-24 NOTE — Addendum Note (Signed)
Addended by: Lucille Passy on: 09/24/2014 09:14 AM   Modules accepted: Miquel Dunn

## 2014-09-24 NOTE — Patient Instructions (Addendum)
Good to see you.  Let's start some over the counter iron along with a B12 gummy 500 mcg daily.  Let's repeat your labs in 2 months.  Please schedule your mammogram.

## 2014-09-24 NOTE — Progress Notes (Signed)
Pre visit review using our clinic review tool, if applicable. No additional management support is needed unless otherwise documented below in the visit note. 

## 2014-09-24 NOTE — Assessment & Plan Note (Signed)
New- likely multifactorial- anemia, b12 deficiency, recent weight loss/surgery. Will increase her b12 supplementation, add iron. The patient indicates understanding of these issues and agrees with the plan.

## 2014-09-24 NOTE — Assessment & Plan Note (Signed)
Well controlled on current rx. No changes made. 
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
## 2014-09-24 NOTE — Assessment & Plan Note (Signed)
Deteriorated. Add oral B12 500 mcg daily along with oral iron- see AVS. Repeat labs in 2 months. The patient indicates understanding of these issues and agrees with the plan.

## 2014-09-24 NOTE — Addendum Note (Signed)
Addended by: Modena Nunnery on: 09/24/2014 09:19 AM   Modules accepted: Orders

## 2014-09-24 NOTE — Assessment & Plan Note (Signed)
Known low LDL. No changes made.

## 2014-09-24 NOTE — Assessment & Plan Note (Signed)
Slowly improving

## 2014-09-24 NOTE — Assessment & Plan Note (Signed)
Well controlled on current rx. No changes made today. 

## 2014-09-24 NOTE — Assessment & Plan Note (Signed)
The patients weight, height, BMI and visual acuity have been recorded in the chart.  Cognitive function assessed.   I have made referrals, counseling and provided education to the patient based review of the above and I have provided the pt with a written personalized care plan for preventive services.  Zostavax today.  She will call to set up her mammogram and eye exam.

## 2014-10-03 ENCOUNTER — Other Ambulatory Visit: Payer: Self-pay | Admitting: Family Medicine

## 2014-10-12 ENCOUNTER — Other Ambulatory Visit: Payer: Self-pay | Admitting: Family Medicine

## 2014-10-18 ENCOUNTER — Other Ambulatory Visit: Payer: Self-pay | Admitting: Family Medicine

## 2014-10-25 ENCOUNTER — Encounter: Payer: Self-pay | Admitting: *Deleted

## 2014-10-30 ENCOUNTER — Other Ambulatory Visit: Payer: Self-pay | Admitting: Family Medicine

## 2014-11-09 DIAGNOSIS — Z1231 Encounter for screening mammogram for malignant neoplasm of breast: Secondary | ICD-10-CM | POA: Diagnosis not present

## 2014-11-13 ENCOUNTER — Telehealth: Payer: Self-pay

## 2014-11-13 ENCOUNTER — Encounter: Payer: Self-pay | Admitting: Family Medicine

## 2014-11-13 DIAGNOSIS — X32XXXA Exposure to sunlight, initial encounter: Secondary | ICD-10-CM | POA: Diagnosis not present

## 2014-11-13 DIAGNOSIS — L989 Disorder of the skin and subcutaneous tissue, unspecified: Secondary | ICD-10-CM | POA: Diagnosis not present

## 2014-11-13 DIAGNOSIS — D485 Neoplasm of uncertain behavior of skin: Secondary | ICD-10-CM | POA: Diagnosis not present

## 2014-11-13 DIAGNOSIS — L570 Actinic keratosis: Secondary | ICD-10-CM | POA: Diagnosis not present

## 2014-11-13 DIAGNOSIS — Z85828 Personal history of other malignant neoplasm of skin: Secondary | ICD-10-CM | POA: Diagnosis not present

## 2014-11-13 DIAGNOSIS — L650 Telogen effluvium: Secondary | ICD-10-CM | POA: Diagnosis not present

## 2014-11-13 MED ORDER — FENOFIBRATE 160 MG PO TABS: ORAL_TABLET | ORAL | Status: DC

## 2014-11-13 NOTE — Telephone Encounter (Signed)
Christine with [facility] left v/m for verification of instructions for fenofibrate 160 mg taking one tab every other day; pt was taking one tab daily and pt was unaware of change in medication. Christine request cb. Med is on med list with 2 different instructions; one has take one daily and the other has take one every other day.Please advise.

## 2014-11-13 NOTE — Telephone Encounter (Signed)
Attempted to contact pt, line busy. In below message, pt indicated to pharmacy she has been taking daily. Med list updated

## 2014-11-13 NOTE — Telephone Encounter (Signed)
It should be take 1 tablet daily.  Please call pt to verify how she is taking this and update her medication list accordingly.

## 2014-11-22 ENCOUNTER — Other Ambulatory Visit: Payer: Self-pay | Admitting: Family Medicine

## 2014-11-22 DIAGNOSIS — I10 Essential (primary) hypertension: Secondary | ICD-10-CM

## 2014-11-22 DIAGNOSIS — E038 Other specified hypothyroidism: Secondary | ICD-10-CM

## 2014-11-22 DIAGNOSIS — E785 Hyperlipidemia, unspecified: Secondary | ICD-10-CM

## 2014-11-22 DIAGNOSIS — Z0000 Encounter for general adult medical examination without abnormal findings: Secondary | ICD-10-CM

## 2014-11-26 ENCOUNTER — Other Ambulatory Visit (INDEPENDENT_AMBULATORY_CARE_PROVIDER_SITE_OTHER): Payer: Medicare Other

## 2014-11-26 DIAGNOSIS — E038 Other specified hypothyroidism: Secondary | ICD-10-CM | POA: Diagnosis not present

## 2014-11-26 DIAGNOSIS — Z0000 Encounter for general adult medical examination without abnormal findings: Secondary | ICD-10-CM

## 2014-11-26 DIAGNOSIS — E785 Hyperlipidemia, unspecified: Secondary | ICD-10-CM

## 2014-11-26 LAB — COMPREHENSIVE METABOLIC PANEL
ALK PHOS: 74 U/L (ref 39–117)
ALT: 105 U/L — AB (ref 0–35)
AST: 159 U/L — AB (ref 0–37)
Albumin: 3.8 g/dL (ref 3.5–5.2)
BILIRUBIN TOTAL: 0.6 mg/dL (ref 0.2–1.2)
BUN: 19 mg/dL (ref 6–23)
CO2: 30 meq/L (ref 19–32)
Calcium: 9.7 mg/dL (ref 8.4–10.5)
Chloride: 106 mEq/L (ref 96–112)
Creatinine, Ser: 1.19 mg/dL (ref 0.40–1.20)
GFR: 48.28 mL/min — ABNORMAL LOW (ref >60.00)
GLUCOSE: 82 mg/dL (ref 70–99)
Potassium: 4.9 mEq/L (ref 3.5–5.1)
Sodium: 141 mEq/L (ref 135–145)
TOTAL PROTEIN: 6.7 g/dL (ref 6.0–8.3)

## 2014-11-26 LAB — CBC WITH DIFFERENTIAL/PLATELET
Basophils Absolute: 0 10*3/uL (ref 0.0–0.1)
Basophils Relative: 0.6 % (ref 0.0–3.0)
EOS ABS: 0.6 10*3/uL (ref 0.0–0.7)
Eosinophils Relative: 10.2 % — ABNORMAL HIGH (ref 0.0–5.0)
HCT: 39.2 % (ref 36.0–46.0)
Hemoglobin: 12.9 g/dL (ref 12.0–15.0)
Lymphocytes Relative: 35.2 % (ref 12.0–46.0)
Lymphs Abs: 2.2 10*3/uL (ref 0.7–4.0)
MCHC: 32.8 g/dL (ref 30.0–36.0)
MCV: 85.9 fl (ref 78.0–100.0)
MONO ABS: 0.5 10*3/uL (ref 0.1–1.0)
MONOS PCT: 8.3 % (ref 3.0–12.0)
NEUTROS PCT: 45.7 % (ref 43.0–77.0)
Neutro Abs: 2.9 10*3/uL (ref 1.4–7.7)
Platelets: 276 10*3/uL (ref 150.0–400.0)
RBC: 4.56 Mil/uL (ref 3.87–5.11)
RDW: 22.6 % — ABNORMAL HIGH (ref 11.5–15.5)
WBC: 6.4 10*3/uL (ref 4.0–10.5)

## 2014-11-26 LAB — LIPID PANEL
CHOL/HDL RATIO: 5
Cholesterol: 124 mg/dL (ref 0–200)
HDL: 22.8 mg/dL — ABNORMAL LOW (ref >39.00)
LDL Cholesterol: 69 mg/dL (ref 0–99)
NONHDL: 101.69
Triglycerides: 164 mg/dL — ABNORMAL HIGH (ref 0.0–149.0)
VLDL: 32.8 mg/dL (ref 0.0–40.0)

## 2014-11-26 LAB — T4, FREE: Free T4: 1.82 ng/dL — ABNORMAL HIGH (ref 0.60–1.60)

## 2014-11-26 LAB — TSH: TSH: 1.17 u[IU]/mL (ref 0.35–4.50)

## 2014-11-27 ENCOUNTER — Ambulatory Visit: Payer: Self-pay | Admitting: Nurse Practitioner

## 2014-11-28 ENCOUNTER — Ambulatory Visit (INDEPENDENT_AMBULATORY_CARE_PROVIDER_SITE_OTHER): Payer: Medicare Other | Admitting: Certified Nurse Midwife

## 2014-11-28 ENCOUNTER — Encounter: Payer: Self-pay | Admitting: Certified Nurse Midwife

## 2014-11-28 VITALS — BP 124/80 | HR 70 | Resp 16 | Ht 64.5 in | Wt 114.0 lb

## 2014-11-28 DIAGNOSIS — Z01419 Encounter for gynecological examination (general) (routine) without abnormal findings: Secondary | ICD-10-CM | POA: Diagnosis not present

## 2014-11-28 DIAGNOSIS — Z1272 Encounter for screening for malignant neoplasm of vagina: Secondary | ICD-10-CM | POA: Diagnosis not present

## 2014-11-28 DIAGNOSIS — N891 Moderate vaginal dysplasia: Secondary | ICD-10-CM

## 2014-11-28 DIAGNOSIS — Z124 Encounter for screening for malignant neoplasm of cervix: Secondary | ICD-10-CM

## 2014-11-28 NOTE — Patient Instructions (Signed)
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
## 2014-11-28 NOTE — Progress Notes (Signed)
Reviewed personally.  M. Suzanne Jonas Goh, MD.  

## 2014-11-30 ENCOUNTER — Telehealth: Payer: Self-pay | Admitting: Certified Nurse Midwife

## 2014-11-30 ENCOUNTER — Other Ambulatory Visit: Payer: Self-pay | Admitting: Certified Nurse Midwife

## 2014-11-30 LAB — IPS PAP SMEAR ONLY

## 2014-11-30 MED ORDER — ESTROGENS, CONJUGATED 0.625 MG/GM VA CREA: 1.0000 | TOPICAL_CREAM | VAGINAL | Status: DC

## 2014-11-30 MED ORDER — ESTRADIOL 0.1 MG/GM VA CREA: TOPICAL_CREAM | VAGINAL | Status: DC

## 2014-11-30 NOTE — Telephone Encounter (Signed)
Patient would like to change her prescription from Premarin to estrace cream. Confirmed pharmacy with patient.

## 2014-11-30 NOTE — Telephone Encounter (Signed)
Medication refill request: Premarin Vaginal Cream Last AEX:  11/28/14 wIth DL Next AEX: 12/04/2015 with DL   Last MMG (if hormonal medication request): 11/09/14 Breast density category A; bi-rads 1: neg Refill authorized: #42.5 gm/3 rfs?

## 2014-11-30 NOTE — Telephone Encounter (Addendum)
[facility] is calling to get a prescription for premarin. She states they have the coupon the patient was given but now needs the prescription. Please fax to [phone]

## 2014-11-30 NOTE — Telephone Encounter (Signed)
Yes Estrace is acceptable

## 2014-11-30 NOTE — Telephone Encounter (Signed)
Spoke with patient. Advised of message as seen below from [place]. Rx for Estrace 0.1 mg/gm apply 1/2 gram vaginally twice a week sent to pharmacy with 3 refills. Patient is agreeable.  Routing to provider for final review. Patient agreeable to disposition. Will close encounter.

## 2014-11-30 NOTE — Telephone Encounter (Signed)
Called pharmacy and pharacist confirmed that they had received the prescription.

## 2014-11-30 NOTE — Telephone Encounter (Signed)
Spoke with patient. Patient states that her insurance will not cover Premarin cream but will cover Estrace. Patient is asking to be switched back to Estrace cream at this time. Advised will speak with Regina Eck CNM regarding change and return call. Patient is agreeable.  Regina Eck CNM okay to switch to Estrace at this time? If so please verify directions for use.

## 2015-01-03 ENCOUNTER — Ambulatory Visit (INDEPENDENT_AMBULATORY_CARE_PROVIDER_SITE_OTHER): Payer: Medicare Other | Admitting: Family Medicine

## 2015-01-03 ENCOUNTER — Encounter: Payer: Self-pay | Admitting: Family Medicine

## 2015-01-03 VITALS — BP 140/82 | HR 79 | Temp 98.4°F | Ht 64.5 in | Wt 117.8 lb

## 2015-01-03 DIAGNOSIS — J441 Chronic obstructive pulmonary disease with (acute) exacerbation: Secondary | ICD-10-CM

## 2015-01-03 MED ORDER — GUAIFENESIN-CODEINE 100-10 MG/5ML PO SYRP: 5.0000 mL | ORAL_SOLUTION | Freq: Every evening | ORAL | Status: DC | PRN

## 2015-01-03 MED ORDER — AZITHROMYCIN 250 MG PO TABS: ORAL_TABLET | ORAL | Status: DC

## 2015-01-03 NOTE — Patient Instructions (Signed)
Mucinex DM  In morning and prescription cough suppressant at night.  Complete antibiotic course. Rest, fluids.  Call SOB or not improving as expected.  Go to ER with severe shortness of breath.

## 2015-01-03 NOTE — Assessment & Plan Note (Signed)
No current indication for prednisone taper. Given change in sputum.. Treat with antibiotics.  Use albuterol as needed and cough suppressant with codeine at night.

## 2015-01-03 NOTE — Progress Notes (Signed)
   Subjective:    Patient ID: [name], female    DOB: 1950/07/06, 64 y.o.   MRN: [id number]  Cough This is a new problem. The current episode started 1 to 4 weeks ago (10 days). The problem has been unchanged (not getting better). The problem occurs constantly. The cough is productive of sputum (clear to light yellow). Associated symptoms include headaches and nasal congestion. Pertinent negatives include no chills, ear congestion, ear pain, fever, myalgias, postnasal drip, sore throat, shortness of breath or wheezing. The symptoms are aggravated by lying down. Risk factors for lung disease include smoking/tobacco exposure. She has tried a beta-agonist inhaler for the symptoms. The treatment provided mild relief. Her past medical history is significant for COPD.  Has tried mucinex with some relief.  Social History /Family History/Past Medical History reviewed and updated if needed. COPD on albuterol prn, no controlled.  Last spirometry: none   Review of Systems  Constitutional: Negative for fever and chills.  HENT: Negative for ear pain, postnasal drip and sore throat.   Respiratory: Positive for cough. Negative for shortness of breath and wheezing.   Musculoskeletal: Negative for myalgias.  Neurological: Positive for headaches.       Objective:   Physical Exam  Constitutional: Vital signs are normal. She appears well-developed and well-nourished. She is cooperative.  Non-toxic appearance. She does not appear ill. No distress.  HENT:  Head: Normocephalic.  Right Ear: Hearing, tympanic membrane, external ear and ear canal normal. Tympanic membrane is not erythematous, not retracted and not bulging.  Left Ear: Hearing, tympanic membrane, external ear and ear canal normal. Tympanic membrane is not erythematous, not retracted and not bulging.  Nose: Mucosal edema and rhinorrhea present. Right sinus exhibits no maxillary sinus tenderness and no frontal sinus tenderness. Left sinus  exhibits no maxillary sinus tenderness and no frontal sinus tenderness.  Mouth/Throat: Uvula is midline, oropharynx is clear and moist and mucous membranes are normal.  Eyes: Conjunctivae, EOM and lids are normal. Pupils are equal, round, and reactive to light. Lids are everted and swept, no foreign bodies found.  Neck: Trachea normal and normal range of motion. Neck supple. Carotid bruit is not present. No thyroid mass and no thyromegaly present.  Cardiovascular: Normal rate, regular rhythm, S1 normal, S2 normal, normal heart sounds, intact distal pulses and normal pulses.  Exam reveals no gallop and no friction rub.   No murmur heard. Pulmonary/Chest: Effort normal. No tachypnea. No respiratory distress. She has no decreased breath sounds. She has no wheezes. She has rhonchi. She has no rales.  Intermittent upper airway noise, occ rhonichi  Neurological: She is alert.  Skin: Skin is warm, dry and intact. No rash noted.  Psychiatric: Her speech is normal and behavior is normal. Judgment normal. Her mood appears not anxious. Cognition and memory are normal. She does not exhibit a depressed mood.          Assessment & Plan:

## 2015-01-03 NOTE — Progress Notes (Signed)
Pre visit review using our clinic review tool, if applicable. No additional management support is needed unless otherwise documented below in the visit note. 

## 2015-01-23 ENCOUNTER — Ambulatory Visit (INDEPENDENT_AMBULATORY_CARE_PROVIDER_SITE_OTHER): Payer: Medicare Other | Admitting: Family Medicine

## 2015-01-23 ENCOUNTER — Encounter: Payer: Self-pay | Admitting: Family Medicine

## 2015-01-23 VITALS — BP 162/90 | HR 62 | Temp 97.5°F | Wt 117.8 lb

## 2015-01-23 DIAGNOSIS — I10 Essential (primary) hypertension: Secondary | ICD-10-CM | POA: Diagnosis not present

## 2015-01-23 DIAGNOSIS — J441 Chronic obstructive pulmonary disease with (acute) exacerbation: Secondary | ICD-10-CM

## 2015-01-23 MED ORDER — AZITHROMYCIN 250 MG PO TABS: ORAL_TABLET | ORAL | Status: DC
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
## 2015-01-23 NOTE — Assessment & Plan Note (Signed)
Deteriorated. Cough now productive of sputum and ronchi on exam.  Repeat course of zpack, continue OTC cough meds but advised to watch BP. See below.

## 2015-01-23 NOTE — Progress Notes (Signed)
Pre visit review using our clinic review tool, if applicable. No additional management support is needed unless otherwise documented below in the visit note. 

## 2015-01-23 NOTE — Patient Instructions (Signed)
Great to see you. Please take zpack as driected.  Please keep checking your blood pressure. Please keep me updated.

## 2015-01-23 NOTE — Assessment & Plan Note (Signed)
Deteriorated- likely due to acute illness and OTC rx. Continue to monitor BP at home. She will keep me updated. She is asymptomatic.

## 2015-01-24 DIAGNOSIS — F17200 Nicotine dependence, unspecified, uncomplicated: Secondary | ICD-10-CM | POA: Diagnosis not present

## 2015-01-24 DIAGNOSIS — E785 Hyperlipidemia, unspecified: Secondary | ICD-10-CM | POA: Diagnosis not present

## 2015-01-24 DIAGNOSIS — I70213 Atherosclerosis of native arteries of extremities with intermittent claudication, bilateral legs: Secondary | ICD-10-CM | POA: Diagnosis not present

## 2015-01-24 DIAGNOSIS — I714 Abdominal aortic aneurysm, without rupture: Secondary | ICD-10-CM | POA: Diagnosis not present

## 2015-01-25 ENCOUNTER — Other Ambulatory Visit: Payer: Self-pay | Admitting: *Deleted

## 2015-01-29 ENCOUNTER — Other Ambulatory Visit: Payer: Self-pay | Admitting: *Deleted

## 2015-01-29 ENCOUNTER — Encounter: Payer: Self-pay | Admitting: Surgery

## 2015-01-29 ENCOUNTER — Ambulatory Visit (INDEPENDENT_AMBULATORY_CARE_PROVIDER_SITE_OTHER): Payer: Medicare Other | Admitting: Surgery

## 2015-01-29 VITALS — BP 138/82 | HR 68 | Temp 97.5°F | Ht 64.5 in | Wt 117.0 lb

## 2015-01-29 DIAGNOSIS — K5720 Diverticulitis of large intestine with perforation and abscess without bleeding: Secondary | ICD-10-CM

## 2015-01-29 NOTE — Progress Notes (Deleted)
Subjective:     Patient ID: [name], female   DOB: May 19, 1950, 64 y.o.   MRN: [id number]  HPI   Review of Systems     Objective:   Physical Exam     Assessment:     ***    Plan:     ***
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
## 2015-02-11 ENCOUNTER — Other Ambulatory Visit: Payer: Self-pay | Admitting: Family Medicine

## 2015-02-14 DIAGNOSIS — M25552 Pain in left hip: Secondary | ICD-10-CM | POA: Diagnosis not present

## 2015-02-14 DIAGNOSIS — S7012XA Contusion of left thigh, initial encounter: Secondary | ICD-10-CM | POA: Diagnosis not present

## 2015-02-15 ENCOUNTER — Emergency Department: Payer: Medicare Other

## 2015-02-15 ENCOUNTER — Encounter: Payer: Self-pay | Admitting: Emergency Medicine

## 2015-02-15 ENCOUNTER — Emergency Department
Admission: EM | Admit: 2015-02-15 | Discharge: 2015-02-15 | Payer: Medicare Other | Attending: Emergency Medicine | Admitting: Emergency Medicine

## 2015-02-15 DIAGNOSIS — I713 Abdominal aortic aneurysm, ruptured, unspecified: Secondary | ICD-10-CM

## 2015-02-15 DIAGNOSIS — R7989 Other specified abnormal findings of blood chemistry: Secondary | ICD-10-CM | POA: Diagnosis not present

## 2015-02-15 DIAGNOSIS — E785 Hyperlipidemia, unspecified: Secondary | ICD-10-CM | POA: Diagnosis present

## 2015-02-15 DIAGNOSIS — R918 Other nonspecific abnormal finding of lung field: Secondary | ICD-10-CM | POA: Diagnosis not present

## 2015-02-15 DIAGNOSIS — N280 Ischemia and infarction of kidney: Secondary | ICD-10-CM | POA: Diagnosis not present

## 2015-02-15 DIAGNOSIS — J151 Pneumonia due to Pseudomonas: Secondary | ICD-10-CM | POA: Diagnosis not present

## 2015-02-15 DIAGNOSIS — F17210 Nicotine dependence, cigarettes, uncomplicated: Secondary | ICD-10-CM | POA: Diagnosis present

## 2015-02-15 DIAGNOSIS — A419 Sepsis, unspecified organism: Secondary | ICD-10-CM | POA: Diagnosis not present

## 2015-02-15 DIAGNOSIS — R531 Weakness: Secondary | ICD-10-CM

## 2015-02-15 DIAGNOSIS — K661 Hemoperitoneum: Secondary | ICD-10-CM | POA: Diagnosis not present

## 2015-02-15 DIAGNOSIS — R1030 Lower abdominal pain, unspecified: Secondary | ICD-10-CM | POA: Diagnosis present

## 2015-02-15 DIAGNOSIS — Z79890 Hormone replacement therapy (postmenopausal): Secondary | ICD-10-CM | POA: Diagnosis not present

## 2015-02-15 DIAGNOSIS — J310 Chronic rhinitis: Secondary | ICD-10-CM | POA: Diagnosis not present

## 2015-02-15 DIAGNOSIS — Z6822 Body mass index (BMI) 22.0-22.9, adult: Secondary | ICD-10-CM | POA: Diagnosis not present

## 2015-02-15 DIAGNOSIS — J9601 Acute respiratory failure with hypoxia: Secondary | ICD-10-CM | POA: Diagnosis not present

## 2015-02-15 DIAGNOSIS — R0902 Hypoxemia: Secondary | ICD-10-CM | POA: Diagnosis not present

## 2015-02-15 DIAGNOSIS — Z79899 Other long term (current) drug therapy: Secondary | ICD-10-CM | POA: Insufficient documentation

## 2015-02-15 DIAGNOSIS — I132 Hypertensive heart and chronic kidney disease with heart failure and with stage 5 chronic kidney disease, or end stage renal disease: Secondary | ICD-10-CM | POA: Diagnosis not present

## 2015-02-15 DIAGNOSIS — R062 Wheezing: Secondary | ICD-10-CM | POA: Diagnosis not present

## 2015-02-15 DIAGNOSIS — N289 Disorder of kidney and ureter, unspecified: Secondary | ICD-10-CM | POA: Diagnosis not present

## 2015-02-15 DIAGNOSIS — J90 Pleural effusion, not elsewhere classified: Secondary | ICD-10-CM | POA: Diagnosis not present

## 2015-02-15 DIAGNOSIS — R262 Difficulty in walking, not elsewhere classified: Secondary | ICD-10-CM | POA: Diagnosis not present

## 2015-02-15 DIAGNOSIS — D689 Coagulation defect, unspecified: Secondary | ICD-10-CM | POA: Diagnosis not present

## 2015-02-15 DIAGNOSIS — I739 Peripheral vascular disease, unspecified: Secondary | ICD-10-CM | POA: Diagnosis not present

## 2015-02-15 DIAGNOSIS — D62 Acute posthemorrhagic anemia: Secondary | ICD-10-CM | POA: Diagnosis not present

## 2015-02-15 DIAGNOSIS — F17200 Nicotine dependence, unspecified, uncomplicated: Secondary | ICD-10-CM | POA: Insufficient documentation

## 2015-02-15 DIAGNOSIS — R0602 Shortness of breath: Secondary | ICD-10-CM | POA: Diagnosis not present

## 2015-02-15 DIAGNOSIS — J9811 Atelectasis: Secondary | ICD-10-CM | POA: Diagnosis not present

## 2015-02-15 DIAGNOSIS — D72829 Elevated white blood cell count, unspecified: Secondary | ICD-10-CM | POA: Diagnosis not present

## 2015-02-15 DIAGNOSIS — J810 Acute pulmonary edema: Secondary | ICD-10-CM | POA: Diagnosis not present

## 2015-02-15 DIAGNOSIS — H6593 Unspecified nonsuppurative otitis media, bilateral: Secondary | ICD-10-CM | POA: Diagnosis not present

## 2015-02-15 DIAGNOSIS — R0689 Other abnormalities of breathing: Secondary | ICD-10-CM | POA: Diagnosis not present

## 2015-02-15 DIAGNOSIS — E875 Hyperkalemia: Secondary | ICD-10-CM | POA: Diagnosis not present

## 2015-02-15 DIAGNOSIS — E872 Acidosis: Secondary | ICD-10-CM | POA: Diagnosis not present

## 2015-02-15 DIAGNOSIS — I998 Other disorder of circulatory system: Secondary | ICD-10-CM | POA: Diagnosis not present

## 2015-02-15 DIAGNOSIS — I7100 Dissection of unspecified site of aorta: Secondary | ICD-10-CM | POA: Diagnosis not present

## 2015-02-15 DIAGNOSIS — Z09 Encounter for follow-up examination after completed treatment for conditions other than malignant neoplasm: Secondary | ICD-10-CM | POA: Diagnosis not present

## 2015-02-15 DIAGNOSIS — R410 Disorientation, unspecified: Secondary | ICD-10-CM | POA: Diagnosis not present

## 2015-02-15 DIAGNOSIS — N19 Unspecified kidney failure: Secondary | ICD-10-CM | POA: Diagnosis not present

## 2015-02-15 DIAGNOSIS — R202 Paresthesia of skin: Secondary | ICD-10-CM

## 2015-02-15 DIAGNOSIS — I270 Primary pulmonary hypertension: Secondary | ICD-10-CM | POA: Diagnosis not present

## 2015-02-15 DIAGNOSIS — Z9889 Other specified postprocedural states: Secondary | ICD-10-CM | POA: Diagnosis not present

## 2015-02-15 DIAGNOSIS — J440 Chronic obstructive pulmonary disease with acute lower respiratory infection: Secondary | ICD-10-CM | POA: Diagnosis present

## 2015-02-15 DIAGNOSIS — Z992 Dependence on renal dialysis: Secondary | ICD-10-CM | POA: Diagnosis not present

## 2015-02-15 DIAGNOSIS — J189 Pneumonia, unspecified organism: Secondary | ICD-10-CM | POA: Diagnosis not present

## 2015-02-15 DIAGNOSIS — I714 Abdominal aortic aneurysm, without rupture, unspecified: Secondary | ICD-10-CM

## 2015-02-15 DIAGNOSIS — K5900 Constipation, unspecified: Secondary | ICD-10-CM | POA: Diagnosis not present

## 2015-02-15 DIAGNOSIS — R0989 Other specified symptoms and signs involving the circulatory and respiratory systems: Secondary | ICD-10-CM | POA: Diagnosis not present

## 2015-02-15 DIAGNOSIS — N179 Acute kidney failure, unspecified: Secondary | ICD-10-CM | POA: Diagnosis not present

## 2015-02-15 DIAGNOSIS — Z9911 Dependence on respirator [ventilator] status: Secondary | ICD-10-CM | POA: Diagnosis not present

## 2015-02-15 DIAGNOSIS — Z4901 Encounter for fitting and adjustment of extracorporeal dialysis catheter: Secondary | ICD-10-CM | POA: Diagnosis not present

## 2015-02-15 DIAGNOSIS — I249 Acute ischemic heart disease, unspecified: Secondary | ICD-10-CM | POA: Diagnosis not present

## 2015-02-15 DIAGNOSIS — R4182 Altered mental status, unspecified: Secondary | ICD-10-CM | POA: Diagnosis not present

## 2015-02-15 DIAGNOSIS — G8918 Other acute postprocedural pain: Secondary | ICD-10-CM | POA: Diagnosis not present

## 2015-02-15 DIAGNOSIS — G9340 Encephalopathy, unspecified: Secondary | ICD-10-CM | POA: Diagnosis not present

## 2015-02-15 DIAGNOSIS — I213 ST elevation (STEMI) myocardial infarction of unspecified site: Secondary | ICD-10-CM | POA: Diagnosis not present

## 2015-02-15 DIAGNOSIS — R29898 Other symptoms and signs involving the musculoskeletal system: Secondary | ICD-10-CM

## 2015-02-15 DIAGNOSIS — N893 Dysplasia of vagina, unspecified: Secondary | ICD-10-CM | POA: Diagnosis present

## 2015-02-15 DIAGNOSIS — I10 Essential (primary) hypertension: Secondary | ICD-10-CM | POA: Diagnosis not present

## 2015-02-15 DIAGNOSIS — E039 Hypothyroidism, unspecified: Secondary | ICD-10-CM | POA: Diagnosis present

## 2015-02-15 DIAGNOSIS — J9383 Other pneumothorax: Secondary | ICD-10-CM | POA: Diagnosis not present

## 2015-02-15 DIAGNOSIS — I248 Other forms of acute ischemic heart disease: Secondary | ICD-10-CM | POA: Diagnosis not present

## 2015-02-15 DIAGNOSIS — I70209 Unspecified atherosclerosis of native arteries of extremities, unspecified extremity: Secondary | ICD-10-CM | POA: Diagnosis not present

## 2015-02-15 DIAGNOSIS — K8020 Calculus of gallbladder without cholecystitis without obstruction: Secondary | ICD-10-CM | POA: Diagnosis not present

## 2015-02-15 DIAGNOSIS — R140 Abdominal distension (gaseous): Secondary | ICD-10-CM | POA: Diagnosis not present

## 2015-02-15 DIAGNOSIS — I253 Aneurysm of heart: Secondary | ICD-10-CM | POA: Diagnosis not present

## 2015-02-15 DIAGNOSIS — T82330A Leakage of aortic (bifurcation) graft (replacement), initial encounter: Secondary | ICD-10-CM | POA: Diagnosis not present

## 2015-02-15 DIAGNOSIS — M6281 Muscle weakness (generalized): Secondary | ICD-10-CM | POA: Diagnosis not present

## 2015-02-15 DIAGNOSIS — J984 Other disorders of lung: Secondary | ICD-10-CM | POA: Diagnosis not present

## 2015-02-15 DIAGNOSIS — D696 Thrombocytopenia, unspecified: Secondary | ICD-10-CM | POA: Diagnosis present

## 2015-02-15 DIAGNOSIS — R1084 Generalized abdominal pain: Secondary | ICD-10-CM | POA: Diagnosis not present

## 2015-02-15 DIAGNOSIS — G894 Chronic pain syndrome: Secondary | ICD-10-CM | POA: Diagnosis not present

## 2015-02-15 DIAGNOSIS — I120 Hypertensive chronic kidney disease with stage 5 chronic kidney disease or end stage renal disease: Secondary | ICD-10-CM | POA: Diagnosis present

## 2015-02-15 DIAGNOSIS — J80 Acute respiratory distress syndrome: Secondary | ICD-10-CM | POA: Diagnosis not present

## 2015-02-15 DIAGNOSIS — T82330S Leakage of aortic (bifurcation) graft (replacement), sequela: Secondary | ICD-10-CM | POA: Diagnosis not present

## 2015-02-15 DIAGNOSIS — N990 Postprocedural (acute) (chronic) kidney failure: Secondary | ICD-10-CM | POA: Diagnosis not present

## 2015-02-15 DIAGNOSIS — J811 Chronic pulmonary edema: Secondary | ICD-10-CM | POA: Diagnosis not present

## 2015-02-15 DIAGNOSIS — I214 Non-ST elevation (NSTEMI) myocardial infarction: Secondary | ICD-10-CM | POA: Diagnosis not present

## 2015-02-15 DIAGNOSIS — Z6825 Body mass index (BMI) 25.0-25.9, adult: Secondary | ICD-10-CM | POA: Diagnosis not present

## 2015-02-15 DIAGNOSIS — R109 Unspecified abdominal pain: Secondary | ICD-10-CM | POA: Diagnosis not present

## 2015-02-15 DIAGNOSIS — R6521 Severe sepsis with septic shock: Secondary | ICD-10-CM | POA: Diagnosis not present

## 2015-02-15 DIAGNOSIS — I340 Nonrheumatic mitral (valve) insufficiency: Secondary | ICD-10-CM | POA: Diagnosis not present

## 2015-02-15 DIAGNOSIS — I129 Hypertensive chronic kidney disease with stage 1 through stage 4 chronic kidney disease, or unspecified chronic kidney disease: Secondary | ICD-10-CM | POA: Diagnosis not present

## 2015-02-15 DIAGNOSIS — R34 Anuria and oliguria: Secondary | ICD-10-CM | POA: Diagnosis not present

## 2015-02-15 DIAGNOSIS — J441 Chronic obstructive pulmonary disease with (acute) exacerbation: Secondary | ICD-10-CM | POA: Diagnosis not present

## 2015-02-15 DIAGNOSIS — N186 End stage renal disease: Secondary | ICD-10-CM | POA: Diagnosis not present

## 2015-02-15 DIAGNOSIS — Z48812 Encounter for surgical aftercare following surgery on the circulatory system: Secondary | ICD-10-CM | POA: Diagnosis not present

## 2015-02-15 DIAGNOSIS — H902 Conductive hearing loss, unspecified: Secondary | ICD-10-CM | POA: Diagnosis present

## 2015-02-15 DIAGNOSIS — J939 Pneumothorax, unspecified: Secondary | ICD-10-CM | POA: Diagnosis not present

## 2015-02-15 DIAGNOSIS — M6250 Muscle wasting and atrophy, not elsewhere classified, unspecified site: Secondary | ICD-10-CM | POA: Diagnosis not present

## 2015-02-15 DIAGNOSIS — I959 Hypotension, unspecified: Secondary | ICD-10-CM | POA: Diagnosis not present

## 2015-02-15 DIAGNOSIS — I351 Nonrheumatic aortic (valve) insufficiency: Secondary | ICD-10-CM | POA: Diagnosis not present

## 2015-02-15 DIAGNOSIS — N2889 Other specified disorders of kidney and ureter: Secondary | ICD-10-CM | POA: Diagnosis not present

## 2015-02-15 DIAGNOSIS — R58 Hemorrhage, not elsewhere classified: Secondary | ICD-10-CM | POA: Diagnosis present

## 2015-02-15 DIAGNOSIS — I2510 Atherosclerotic heart disease of native coronary artery without angina pectoris: Secondary | ICD-10-CM | POA: Diagnosis not present

## 2015-02-15 DIAGNOSIS — Z452 Encounter for adjustment and management of vascular access device: Secondary | ICD-10-CM | POA: Diagnosis not present

## 2015-02-15 DIAGNOSIS — I517 Cardiomegaly: Secondary | ICD-10-CM | POA: Diagnosis not present

## 2015-02-15 DIAGNOSIS — I729 Aneurysm of unspecified site: Secondary | ICD-10-CM | POA: Diagnosis not present

## 2015-02-15 DIAGNOSIS — K219 Gastro-esophageal reflux disease without esophagitis: Secondary | ICD-10-CM | POA: Diagnosis present

## 2015-02-15 DIAGNOSIS — Z4682 Encounter for fitting and adjustment of non-vascular catheter: Secondary | ICD-10-CM | POA: Diagnosis not present

## 2015-02-15 LAB — CBC WITH DIFFERENTIAL/PLATELET
BASOS ABS: 0.1 10*3/uL (ref 0–0.1)
BASOS PCT: 1 %
EOS ABS: 1.4 10*3/uL — AB (ref 0–0.7)
Eosinophils Relative: 11 %
HCT: 34.5 % — ABNORMAL LOW (ref 35.0–47.0)
HEMOGLOBIN: 11.7 g/dL — AB (ref 12.0–16.0)
Lymphocytes Relative: 35 %
Lymphs Abs: 4.3 10*3/uL — ABNORMAL HIGH (ref 1.0–3.6)
MCH: 30.5 pg (ref 26.0–34.0)
MCHC: 34 g/dL (ref 32.0–36.0)
MCV: 89.8 fL (ref 80.0–100.0)
MONOS PCT: 8 %
Monocytes Absolute: 1 10*3/uL — ABNORMAL HIGH (ref 0.2–0.9)
NEUTROS ABS: 5.7 10*3/uL (ref 1.4–6.5)
NEUTROS PCT: 45 %
Platelets: 411 10*3/uL (ref 150–440)
RBC: 3.84 MIL/uL (ref 3.80–5.20)
RDW: 14.1 % (ref 11.5–14.5)
WBC: 12.5 10*3/uL — ABNORMAL HIGH (ref 3.6–11.0)

## 2015-02-15 LAB — COMPREHENSIVE METABOLIC PANEL
ALBUMIN: 3.1 g/dL — AB (ref 3.5–5.0)
ALK PHOS: 95 U/L (ref 38–126)
ALT: 11 U/L — ABNORMAL LOW (ref 14–54)
ANION GAP: 7 (ref 5–15)
AST: 26 U/L (ref 15–41)
BUN: 21 mg/dL — AB (ref 6–20)
CO2: 22 mmol/L (ref 22–32)
Calcium: 8.5 mg/dL — ABNORMAL LOW (ref 8.9–10.3)
Chloride: 108 mmol/L (ref 101–111)
Creatinine, Ser: 1.38 mg/dL — ABNORMAL HIGH (ref 0.44–1.00)
GFR calc Af Amer: 45 mL/min — ABNORMAL LOW (ref >60)
GFR calc non Af Amer: 39 mL/min — ABNORMAL LOW (ref >60)
GLUCOSE: 130 mg/dL — AB (ref 65–99)
POTASSIUM: 4 mmol/L (ref 3.5–5.1)
SODIUM: 137 mmol/L (ref 135–145)
Total Bilirubin: 0.5 mg/dL (ref 0.3–1.2)
Total Protein: 6 g/dL — ABNORMAL LOW (ref 6.5–8.1)

## 2015-02-15 LAB — LACTIC ACID, PLASMA: LACTIC ACID, VENOUS: 1.6 mmol/L (ref 0.5–2.0)

## 2015-02-15 LAB — PREPARE RBC (CROSSMATCH)

## 2015-02-15 LAB — TROPONIN I: Troponin I: <0.03 ng/mL (ref <0.031)

## 2015-02-15 LAB — LIPASE, BLOOD: Lipase: 38 U/L (ref 11–51)

## 2015-02-15 LAB — ABO/RH: ABO/RH(D): B POS

## 2015-02-15 MED ORDER — SODIUM CHLORIDE 0.9 % IV BOLUS (SEPSIS)
2000.0000 mL | Freq: Once | INTRAVENOUS | Status: AC
  Administered 2015-02-15: 2000 mL via INTRAVENOUS

## 2015-02-15 MED ORDER — HYDROMORPHONE HCL 1 MG/ML IJ SOLN
0.5000 mg | INTRAMUSCULAR | Status: AC
  Administered 2015-02-15: 0.5 mg via INTRAVENOUS
  Filled 2015-02-15: qty 1

## 2015-02-15 MED ORDER — DOPAMINE-DEXTROSE 3.2-5 MG/ML-% IV SOLN
0.0000 ug/kg/min | INTRAVENOUS | Status: DC
  Administered 2015-02-15: 2.5 ug/kg/min via INTRAVENOUS

## 2015-02-15 MED ORDER — IOHEXOL 350 MG/ML SOLN
100.0000 mL | Freq: Once | INTRAVENOUS | Status: AC | PRN
  Administered 2015-02-15: 100 mL via INTRAVENOUS

## 2015-02-15 MED ORDER — SODIUM CHLORIDE 0.9 % IV SOLN: 10.0000 mL/h | Freq: Once | INTRAVENOUS | Status: DC

## 2015-02-15 NOTE — ED Notes (Signed)
Report called to [facility], given to [place], [place] [phone]. Pt transported to [facility] via [place] with Annie Main, RN since [facility] transportation would take to long to pick up the Pt.

## 2015-02-15 NOTE — ED Provider Notes (Signed)
-----------------------------------------   9:39 PM on 02/15/2015 -----------------------------------------  Just received a call from our emergency department nurse who is riding along with the patient in the ambulance en route to [facility]. They report that the blood pressure is now 70/50 and the patient is more diaphoretic. She is still awake but less alert. At my direction the nurse did have a bag of dopamine with him for transport just in case due to the extenuating circumstances and likelihood of imminent death. At this time I gave a verbal order over the phone to go ahead and start a dopamine infusion and titrated to maintain a map of 60.  Carrie Mew, MD 02/15/15 [phone]

## 2015-02-15 NOTE — ED Notes (Signed)
MD at bedside, notified the Pt of the results. Pt will be transferred to another facility.
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
## 2015-02-15 NOTE — ED Notes (Signed)
MD at bedside conducting an ultrasound to rule out ruptured AAA. Pt stated that she has a AAA.

## 2015-02-15 NOTE — ED Notes (Signed)
Pt arrived to the ED from home for acute abdominal pain. Pt reports that she was watching TV when she had a sudden onset of lower abdominal pain; Pt has a hx of abdominal problems with a colon resection with a reversal in April. No abnormalities with the Pt's BMs. Pt is in moderate pain distress, diaphoretic during triage. MD notified.

## 2015-02-15 NOTE — ED Notes (Signed)
Pt returned from CT °

## 2015-02-15 NOTE — ED Notes (Signed)
CT result came back with a positive ruptured AAA. Family at bedside was notified and they are calling the Pt's husband: Rosezella [place] [phone].

## 2015-02-16 DIAGNOSIS — T82330A Leakage of aortic (bifurcation) graft (replacement), initial encounter: Secondary | ICD-10-CM | POA: Insufficient documentation

## 2015-02-16 DIAGNOSIS — IMO0002 Reserved for concepts with insufficient information to code with codable children: Secondary | ICD-10-CM | POA: Insufficient documentation

## 2015-02-16 LAB — TYPE AND SCREEN
ABO/RH(D): B POS
ANTIBODY SCREEN: NEGATIVE
UNIT DIVISION: 0
UNIT DIVISION: 0

## 2015-02-22 DIAGNOSIS — Z992 Dependence on renal dialysis: Secondary | ICD-10-CM | POA: Insufficient documentation

## 2015-02-22 DIAGNOSIS — Z95828 Presence of other vascular implants and grafts: Secondary | ICD-10-CM | POA: Insufficient documentation

## 2015-02-26 DIAGNOSIS — J449 Chronic obstructive pulmonary disease, unspecified: Secondary | ICD-10-CM | POA: Insufficient documentation

## 2015-03-01 DIAGNOSIS — J151 Pneumonia due to Pseudomonas: Secondary | ICD-10-CM | POA: Insufficient documentation

## 2015-03-19 DIAGNOSIS — D7582 Heparin induced thrombocytopenia (HIT): Secondary | ICD-10-CM | POA: Diagnosis present

## 2015-03-19 DIAGNOSIS — R1310 Dysphagia, unspecified: Secondary | ICD-10-CM | POA: Diagnosis present

## 2015-03-19 DIAGNOSIS — I713 Abdominal aortic aneurysm, ruptured: Secondary | ICD-10-CM | POA: Diagnosis not present

## 2015-03-19 DIAGNOSIS — R062 Wheezing: Secondary | ICD-10-CM | POA: Diagnosis not present

## 2015-03-19 DIAGNOSIS — J810 Acute pulmonary edema: Secondary | ICD-10-CM | POA: Diagnosis not present

## 2015-03-19 DIAGNOSIS — Z79899 Other long term (current) drug therapy: Secondary | ICD-10-CM | POA: Diagnosis not present

## 2015-03-19 DIAGNOSIS — Z992 Dependence on renal dialysis: Secondary | ICD-10-CM | POA: Diagnosis not present

## 2015-03-19 DIAGNOSIS — I509 Heart failure, unspecified: Secondary | ICD-10-CM | POA: Diagnosis not present

## 2015-03-19 DIAGNOSIS — J310 Chronic rhinitis: Secondary | ICD-10-CM | POA: Diagnosis not present

## 2015-03-19 DIAGNOSIS — R0600 Dyspnea, unspecified: Secondary | ICD-10-CM | POA: Diagnosis not present

## 2015-03-19 DIAGNOSIS — R0602 Shortness of breath: Secondary | ICD-10-CM | POA: Diagnosis not present

## 2015-03-19 DIAGNOSIS — I2510 Atherosclerotic heart disease of native coronary artery without angina pectoris: Secondary | ICD-10-CM | POA: Diagnosis not present

## 2015-03-19 DIAGNOSIS — Z8049 Family history of malignant neoplasm of other genital organs: Secondary | ICD-10-CM | POA: Diagnosis not present

## 2015-03-19 DIAGNOSIS — R262 Difficulty in walking, not elsewhere classified: Secondary | ICD-10-CM | POA: Diagnosis not present

## 2015-03-19 DIAGNOSIS — N186 End stage renal disease: Secondary | ICD-10-CM | POA: Diagnosis not present

## 2015-03-19 DIAGNOSIS — R7989 Other specified abnormal findings of blood chemistry: Secondary | ICD-10-CM | POA: Diagnosis not present

## 2015-03-19 DIAGNOSIS — E039 Hypothyroidism, unspecified: Secondary | ICD-10-CM | POA: Diagnosis not present

## 2015-03-19 DIAGNOSIS — N2581 Secondary hyperparathyroidism of renal origin: Secondary | ICD-10-CM | POA: Diagnosis present

## 2015-03-19 DIAGNOSIS — I714 Abdominal aortic aneurysm, without rupture: Secondary | ICD-10-CM | POA: Diagnosis not present

## 2015-03-19 DIAGNOSIS — I214 Non-ST elevation (NSTEMI) myocardial infarction: Secondary | ICD-10-CM | POA: Diagnosis not present

## 2015-03-19 DIAGNOSIS — J9621 Acute and chronic respiratory failure with hypoxia: Secondary | ICD-10-CM | POA: Diagnosis present

## 2015-03-19 DIAGNOSIS — I253 Aneurysm of heart: Secondary | ICD-10-CM | POA: Diagnosis not present

## 2015-03-19 DIAGNOSIS — G894 Chronic pain syndrome: Secondary | ICD-10-CM | POA: Diagnosis not present

## 2015-03-19 DIAGNOSIS — E440 Moderate protein-calorie malnutrition: Secondary | ICD-10-CM | POA: Diagnosis present

## 2015-03-19 DIAGNOSIS — I080 Rheumatic disorders of both mitral and aortic valves: Secondary | ICD-10-CM | POA: Diagnosis present

## 2015-03-19 DIAGNOSIS — Z823 Family history of stroke: Secondary | ICD-10-CM | POA: Diagnosis not present

## 2015-03-19 DIAGNOSIS — E785 Hyperlipidemia, unspecified: Secondary | ICD-10-CM | POA: Diagnosis present

## 2015-03-19 DIAGNOSIS — D631 Anemia in chronic kidney disease: Secondary | ICD-10-CM | POA: Diagnosis not present

## 2015-03-19 DIAGNOSIS — J441 Chronic obstructive pulmonary disease with (acute) exacerbation: Secondary | ICD-10-CM | POA: Diagnosis not present

## 2015-03-19 DIAGNOSIS — J9601 Acute respiratory failure with hypoxia: Secondary | ICD-10-CM | POA: Diagnosis not present

## 2015-03-19 DIAGNOSIS — K5900 Constipation, unspecified: Secondary | ICD-10-CM | POA: Diagnosis not present

## 2015-03-19 DIAGNOSIS — I132 Hypertensive heart and chronic kidney disease with heart failure and with stage 5 chronic kidney disease, or end stage renal disease: Secondary | ICD-10-CM | POA: Diagnosis present

## 2015-03-19 DIAGNOSIS — T82898A Other specified complication of vascular prosthetic devices, implants and grafts, initial encounter: Secondary | ICD-10-CM | POA: Diagnosis present

## 2015-03-19 DIAGNOSIS — M6250 Muscle wasting and atrophy, not elsewhere classified, unspecified site: Secondary | ICD-10-CM | POA: Diagnosis not present

## 2015-03-19 DIAGNOSIS — I501 Left ventricular failure: Secondary | ICD-10-CM | POA: Diagnosis not present

## 2015-03-19 DIAGNOSIS — J811 Chronic pulmonary edema: Secondary | ICD-10-CM | POA: Diagnosis not present

## 2015-03-19 DIAGNOSIS — I6602 Occlusion and stenosis of left middle cerebral artery: Secondary | ICD-10-CM | POA: Diagnosis present

## 2015-03-19 DIAGNOSIS — L7634 Postprocedural seroma of skin and subcutaneous tissue following other procedure: Secondary | ICD-10-CM | POA: Diagnosis present

## 2015-03-19 DIAGNOSIS — M7989 Other specified soft tissue disorders: Secondary | ICD-10-CM | POA: Diagnosis not present

## 2015-03-19 DIAGNOSIS — I10 Essential (primary) hypertension: Secondary | ICD-10-CM | POA: Diagnosis not present

## 2015-03-19 DIAGNOSIS — I5031 Acute diastolic (congestive) heart failure: Secondary | ICD-10-CM | POA: Diagnosis present

## 2015-03-19 DIAGNOSIS — J449 Chronic obstructive pulmonary disease, unspecified: Secondary | ICD-10-CM | POA: Diagnosis present

## 2015-03-19 DIAGNOSIS — Z87891 Personal history of nicotine dependence: Secondary | ICD-10-CM | POA: Diagnosis not present

## 2015-03-19 DIAGNOSIS — Z85828 Personal history of other malignant neoplasm of skin: Secondary | ICD-10-CM | POA: Diagnosis not present

## 2015-03-19 DIAGNOSIS — Z7982 Long term (current) use of aspirin: Secondary | ICD-10-CM | POA: Diagnosis not present

## 2015-03-19 DIAGNOSIS — J9811 Atelectasis: Secondary | ICD-10-CM | POA: Diagnosis not present

## 2015-03-19 DIAGNOSIS — Z8249 Family history of ischemic heart disease and other diseases of the circulatory system: Secondary | ICD-10-CM | POA: Diagnosis not present

## 2015-03-19 DIAGNOSIS — D72829 Elevated white blood cell count, unspecified: Secondary | ICD-10-CM | POA: Diagnosis not present

## 2015-03-19 DIAGNOSIS — H6593 Unspecified nonsuppurative otitis media, bilateral: Secondary | ICD-10-CM | POA: Diagnosis not present

## 2015-03-19 DIAGNOSIS — R609 Edema, unspecified: Secondary | ICD-10-CM | POA: Diagnosis not present

## 2015-03-19 DIAGNOSIS — M6281 Muscle weakness (generalized): Secondary | ICD-10-CM | POA: Diagnosis not present

## 2015-03-19 DIAGNOSIS — Y832 Surgical operation with anastomosis, bypass or graft as the cause of abnormal reaction of the patient, or of later complication, without mention of misadventure at the time of the procedure: Secondary | ICD-10-CM | POA: Diagnosis present

## 2015-03-19 DIAGNOSIS — D509 Iron deficiency anemia, unspecified: Secondary | ICD-10-CM | POA: Diagnosis not present

## 2015-03-19 DIAGNOSIS — K219 Gastro-esophageal reflux disease without esophagitis: Secondary | ICD-10-CM | POA: Diagnosis present

## 2015-03-19 DIAGNOSIS — Z7901 Long term (current) use of anticoagulants: Secondary | ICD-10-CM | POA: Diagnosis not present

## 2015-03-19 DIAGNOSIS — Z7951 Long term (current) use of inhaled steroids: Secondary | ICD-10-CM | POA: Diagnosis not present

## 2015-03-21 DIAGNOSIS — D509 Iron deficiency anemia, unspecified: Secondary | ICD-10-CM | POA: Diagnosis not present

## 2015-03-21 DIAGNOSIS — N2581 Secondary hyperparathyroidism of renal origin: Secondary | ICD-10-CM | POA: Diagnosis not present

## 2015-03-21 DIAGNOSIS — Z992 Dependence on renal dialysis: Secondary | ICD-10-CM | POA: Diagnosis not present

## 2015-03-21 DIAGNOSIS — N186 End stage renal disease: Secondary | ICD-10-CM | POA: Diagnosis not present

## 2015-03-22 DIAGNOSIS — Z992 Dependence on renal dialysis: Secondary | ICD-10-CM | POA: Diagnosis not present

## 2015-03-22 DIAGNOSIS — I714 Abdominal aortic aneurysm, without rupture: Secondary | ICD-10-CM | POA: Diagnosis not present

## 2015-03-22 DIAGNOSIS — E039 Hypothyroidism, unspecified: Secondary | ICD-10-CM | POA: Diagnosis not present

## 2015-03-22 DIAGNOSIS — N186 End stage renal disease: Secondary | ICD-10-CM | POA: Diagnosis not present

## 2015-03-22 DIAGNOSIS — I2510 Atherosclerotic heart disease of native coronary artery without angina pectoris: Secondary | ICD-10-CM | POA: Diagnosis not present

## 2015-03-23 DIAGNOSIS — D509 Iron deficiency anemia, unspecified: Secondary | ICD-10-CM | POA: Diagnosis not present

## 2015-03-23 DIAGNOSIS — N186 End stage renal disease: Secondary | ICD-10-CM | POA: Diagnosis not present

## 2015-03-23 DIAGNOSIS — Z992 Dependence on renal dialysis: Secondary | ICD-10-CM | POA: Diagnosis not present

## 2015-03-23 DIAGNOSIS — N2581 Secondary hyperparathyroidism of renal origin: Secondary | ICD-10-CM | POA: Diagnosis not present

## 2015-03-26 DIAGNOSIS — N2581 Secondary hyperparathyroidism of renal origin: Secondary | ICD-10-CM | POA: Diagnosis not present

## 2015-03-26 DIAGNOSIS — D509 Iron deficiency anemia, unspecified: Secondary | ICD-10-CM | POA: Diagnosis not present

## 2015-03-26 DIAGNOSIS — Z992 Dependence on renal dialysis: Secondary | ICD-10-CM | POA: Diagnosis not present

## 2015-03-26 DIAGNOSIS — N186 End stage renal disease: Secondary | ICD-10-CM | POA: Diagnosis not present

## 2015-03-28 DIAGNOSIS — N2581 Secondary hyperparathyroidism of renal origin: Secondary | ICD-10-CM | POA: Diagnosis not present

## 2015-03-28 DIAGNOSIS — Z992 Dependence on renal dialysis: Secondary | ICD-10-CM | POA: Diagnosis not present

## 2015-03-28 DIAGNOSIS — D509 Iron deficiency anemia, unspecified: Secondary | ICD-10-CM | POA: Diagnosis not present

## 2015-03-28 DIAGNOSIS — N186 End stage renal disease: Secondary | ICD-10-CM | POA: Diagnosis not present

## 2015-03-30 DIAGNOSIS — N2581 Secondary hyperparathyroidism of renal origin: Secondary | ICD-10-CM | POA: Diagnosis not present

## 2015-03-30 DIAGNOSIS — N186 End stage renal disease: Secondary | ICD-10-CM | POA: Diagnosis not present

## 2015-03-30 DIAGNOSIS — D509 Iron deficiency anemia, unspecified: Secondary | ICD-10-CM | POA: Diagnosis not present

## 2015-03-30 DIAGNOSIS — Z992 Dependence on renal dialysis: Secondary | ICD-10-CM | POA: Diagnosis not present

## 2015-04-02 DIAGNOSIS — D509 Iron deficiency anemia, unspecified: Secondary | ICD-10-CM | POA: Diagnosis not present

## 2015-04-02 DIAGNOSIS — Z992 Dependence on renal dialysis: Secondary | ICD-10-CM | POA: Diagnosis not present

## 2015-04-02 DIAGNOSIS — N186 End stage renal disease: Secondary | ICD-10-CM | POA: Diagnosis not present

## 2015-04-04 DIAGNOSIS — D509 Iron deficiency anemia, unspecified: Secondary | ICD-10-CM | POA: Diagnosis not present

## 2015-04-04 DIAGNOSIS — N186 End stage renal disease: Secondary | ICD-10-CM | POA: Diagnosis not present

## 2015-04-04 DIAGNOSIS — Z992 Dependence on renal dialysis: Secondary | ICD-10-CM | POA: Diagnosis not present

## 2015-04-09 ENCOUNTER — Inpatient Hospital Stay: Payer: Medicare Other

## 2015-04-09 ENCOUNTER — Inpatient Hospital Stay
Admission: EM | Admit: 2015-04-09 | Discharge: 2015-04-22 | DRG: 907 | Disposition: A | Discharge status: Skilled Nursing Facility | Payer: Medicare Other | Attending: Internal Medicine | Admitting: Internal Medicine

## 2015-04-09 ENCOUNTER — Emergency Department: Payer: Medicare Other

## 2015-04-09 ENCOUNTER — Inpatient Hospital Stay
Admit: 2015-04-09 | Discharge: 2015-04-09 | Disposition: A | Discharge status: Home / Self Care | Payer: Medicare Other | Attending: Cardiovascular Disease | Admitting: Cardiovascular Disease

## 2015-04-09 DIAGNOSIS — Z87898 Personal history of other specified conditions: Secondary | ICD-10-CM

## 2015-04-09 DIAGNOSIS — R0600 Dyspnea, unspecified: Secondary | ICD-10-CM

## 2015-04-09 DIAGNOSIS — I739 Peripheral vascular disease, unspecified: Secondary | ICD-10-CM | POA: Diagnosis not present

## 2015-04-09 DIAGNOSIS — T82898A Other specified complication of vascular prosthetic devices, implants and grafts, initial encounter: Secondary | ICD-10-CM | POA: Diagnosis present

## 2015-04-09 DIAGNOSIS — Z79899 Other long term (current) drug therapy: Secondary | ICD-10-CM

## 2015-04-09 DIAGNOSIS — Z992 Dependence on renal dialysis: Secondary | ICD-10-CM

## 2015-04-09 DIAGNOSIS — R601 Generalized edema: Secondary | ICD-10-CM

## 2015-04-09 DIAGNOSIS — I501 Left ventricular failure: Secondary | ICD-10-CM | POA: Diagnosis not present

## 2015-04-09 DIAGNOSIS — N186 End stage renal disease: Secondary | ICD-10-CM | POA: Diagnosis present

## 2015-04-09 DIAGNOSIS — Z85828 Personal history of other malignant neoplasm of skin: Secondary | ICD-10-CM

## 2015-04-09 DIAGNOSIS — J441 Chronic obstructive pulmonary disease with (acute) exacerbation: Secondary | ICD-10-CM | POA: Diagnosis not present

## 2015-04-09 DIAGNOSIS — I10 Essential (primary) hypertension: Secondary | ICD-10-CM

## 2015-04-09 DIAGNOSIS — Z823 Family history of stroke: Secondary | ICD-10-CM

## 2015-04-09 DIAGNOSIS — I6602 Occlusion and stenosis of left middle cerebral artery: Secondary | ICD-10-CM | POA: Diagnosis present

## 2015-04-09 DIAGNOSIS — E785 Hyperlipidemia, unspecified: Secondary | ICD-10-CM | POA: Diagnosis present

## 2015-04-09 DIAGNOSIS — D7582 Heparin induced thrombocytopenia (HIT): Secondary | ICD-10-CM

## 2015-04-09 DIAGNOSIS — R262 Difficulty in walking, not elsewhere classified: Secondary | ICD-10-CM | POA: Diagnosis not present

## 2015-04-09 DIAGNOSIS — J90 Pleural effusion, not elsewhere classified: Secondary | ICD-10-CM | POA: Diagnosis not present

## 2015-04-09 DIAGNOSIS — R531 Weakness: Secondary | ICD-10-CM

## 2015-04-09 DIAGNOSIS — Z8049 Family history of malignant neoplasm of other genital organs: Secondary | ICD-10-CM

## 2015-04-09 DIAGNOSIS — Z7951 Long term (current) use of inhaled steroids: Secondary | ICD-10-CM

## 2015-04-09 DIAGNOSIS — R0602 Shortness of breath: Secondary | ICD-10-CM | POA: Diagnosis not present

## 2015-04-09 DIAGNOSIS — J9621 Acute and chronic respiratory failure with hypoxia: Secondary | ICD-10-CM | POA: Diagnosis present

## 2015-04-09 DIAGNOSIS — R609 Edema, unspecified: Secondary | ICD-10-CM | POA: Diagnosis not present

## 2015-04-09 DIAGNOSIS — Z87891 Personal history of nicotine dependence: Secondary | ICD-10-CM | POA: Diagnosis not present

## 2015-04-09 DIAGNOSIS — J810 Acute pulmonary edema: Secondary | ICD-10-CM | POA: Diagnosis not present

## 2015-04-09 DIAGNOSIS — R7989 Other specified abnormal findings of blood chemistry: Secondary | ICD-10-CM | POA: Diagnosis not present

## 2015-04-09 DIAGNOSIS — E039 Hypothyroidism, unspecified: Secondary | ICD-10-CM | POA: Diagnosis present

## 2015-04-09 DIAGNOSIS — R1310 Dysphagia, unspecified: Secondary | ICD-10-CM

## 2015-04-09 DIAGNOSIS — Z8249 Family history of ischemic heart disease and other diseases of the circulatory system: Secondary | ICD-10-CM

## 2015-04-09 DIAGNOSIS — Z7982 Long term (current) use of aspirin: Secondary | ICD-10-CM

## 2015-04-09 DIAGNOSIS — R05 Cough: Secondary | ICD-10-CM | POA: Diagnosis not present

## 2015-04-09 DIAGNOSIS — I120 Hypertensive chronic kidney disease with stage 5 chronic kidney disease or end stage renal disease: Secondary | ICD-10-CM | POA: Diagnosis not present

## 2015-04-09 DIAGNOSIS — I132 Hypertensive heart and chronic kidney disease with heart failure and with stage 5 chronic kidney disease, or end stage renal disease: Secondary | ICD-10-CM | POA: Diagnosis present

## 2015-04-09 DIAGNOSIS — Z452 Encounter for adjustment and management of vascular access device: Secondary | ICD-10-CM | POA: Diagnosis not present

## 2015-04-09 DIAGNOSIS — R778 Other specified abnormalities of plasma proteins: Secondary | ICD-10-CM

## 2015-04-09 DIAGNOSIS — D72829 Elevated white blood cell count, unspecified: Secondary | ICD-10-CM

## 2015-04-09 DIAGNOSIS — M7989 Other specified soft tissue disorders: Secondary | ICD-10-CM

## 2015-04-09 DIAGNOSIS — D649 Anemia, unspecified: Secondary | ICD-10-CM | POA: Diagnosis not present

## 2015-04-09 DIAGNOSIS — T792XXA Traumatic secondary and recurrent hemorrhage and seroma, initial encounter: Secondary | ICD-10-CM | POA: Diagnosis not present

## 2015-04-09 DIAGNOSIS — E440 Moderate protein-calorie malnutrition: Secondary | ICD-10-CM | POA: Diagnosis present

## 2015-04-09 DIAGNOSIS — I714 Abdominal aortic aneurysm, without rupture: Secondary | ICD-10-CM | POA: Diagnosis not present

## 2015-04-09 DIAGNOSIS — IMO0002 Reserved for concepts with insufficient information to code with codable children: Secondary | ICD-10-CM

## 2015-04-09 DIAGNOSIS — N2581 Secondary hyperparathyroidism of renal origin: Secondary | ICD-10-CM | POA: Diagnosis present

## 2015-04-09 DIAGNOSIS — J310 Chronic rhinitis: Secondary | ICD-10-CM | POA: Diagnosis not present

## 2015-04-09 DIAGNOSIS — I713 Abdominal aortic aneurysm, ruptured, unspecified: Secondary | ICD-10-CM

## 2015-04-09 DIAGNOSIS — I5031 Acute diastolic (congestive) heart failure: Secondary | ICD-10-CM | POA: Diagnosis present

## 2015-04-09 DIAGNOSIS — K219 Gastro-esophageal reflux disease without esophagitis: Secondary | ICD-10-CM | POA: Diagnosis present

## 2015-04-09 DIAGNOSIS — R509 Fever, unspecified: Secondary | ICD-10-CM | POA: Diagnosis not present

## 2015-04-09 DIAGNOSIS — I2510 Atherosclerotic heart disease of native coronary artery without angina pectoris: Secondary | ICD-10-CM | POA: Diagnosis present

## 2015-04-09 DIAGNOSIS — I509 Heart failure, unspecified: Secondary | ICD-10-CM | POA: Diagnosis not present

## 2015-04-09 DIAGNOSIS — L7634 Postprocedural seroma of skin and subcutaneous tissue following other procedure: Principal | ICD-10-CM | POA: Diagnosis present

## 2015-04-09 DIAGNOSIS — M6281 Muscle weakness (generalized): Secondary | ICD-10-CM | POA: Diagnosis not present

## 2015-04-09 DIAGNOSIS — R059 Cough, unspecified: Secondary | ICD-10-CM

## 2015-04-09 DIAGNOSIS — Y832 Surgical operation with anastomosis, bypass or graft as the cause of abnormal reaction of the patient, or of later complication, without mention of misadventure at the time of the procedure: Secondary | ICD-10-CM | POA: Diagnosis present

## 2015-04-09 DIAGNOSIS — J449 Chronic obstructive pulmonary disease, unspecified: Secondary | ICD-10-CM | POA: Diagnosis present

## 2015-04-09 DIAGNOSIS — J9601 Acute respiratory failure with hypoxia: Secondary | ICD-10-CM | POA: Diagnosis not present

## 2015-04-09 DIAGNOSIS — T829XXA Unspecified complication of cardiac and vascular prosthetic device, implant and graft, initial encounter: Secondary | ICD-10-CM

## 2015-04-09 DIAGNOSIS — R0603 Acute respiratory distress: Secondary | ICD-10-CM

## 2015-04-09 DIAGNOSIS — I080 Rheumatic disorders of both mitral and aortic valves: Secondary | ICD-10-CM | POA: Diagnosis present

## 2015-04-09 DIAGNOSIS — D638 Anemia in other chronic diseases classified elsewhere: Secondary | ICD-10-CM

## 2015-04-09 DIAGNOSIS — D631 Anemia in chronic kidney disease: Secondary | ICD-10-CM | POA: Diagnosis present

## 2015-04-09 DIAGNOSIS — D696 Thrombocytopenia, unspecified: Secondary | ICD-10-CM

## 2015-04-09 DIAGNOSIS — R062 Wheezing: Secondary | ICD-10-CM | POA: Diagnosis not present

## 2015-04-09 DIAGNOSIS — T888XXA Other specified complications of surgical and medical care, not elsewhere classified, initial encounter: Secondary | ICD-10-CM | POA: Diagnosis not present

## 2015-04-09 DIAGNOSIS — J811 Chronic pulmonary edema: Secondary | ICD-10-CM | POA: Diagnosis not present

## 2015-04-09 DIAGNOSIS — J9811 Atelectasis: Secondary | ICD-10-CM | POA: Diagnosis not present

## 2015-04-09 DIAGNOSIS — R600 Localized edema: Secondary | ICD-10-CM | POA: Diagnosis not present

## 2015-04-09 DIAGNOSIS — Z7901 Long term (current) use of anticoagulants: Secondary | ICD-10-CM

## 2015-04-09 DIAGNOSIS — D75829 Heparin-induced thrombocytopenia, unspecified: Secondary | ICD-10-CM

## 2015-04-09 DIAGNOSIS — G894 Chronic pain syndrome: Secondary | ICD-10-CM | POA: Diagnosis not present

## 2015-04-09 HISTORY — DX: Chronic obstructive pulmonary disease, unspecified: J44.9

## 2015-04-09 HISTORY — DX: Disorder of kidney and ureter, unspecified: N28.9

## 2015-04-09 LAB — CBC
HCT: 38.3 % (ref 35.0–47.0)
HEMATOCRIT: 31.5 % — AB (ref 35.0–47.0)
HEMOGLOBIN: 10.6 g/dL — AB (ref 12.0–16.0)
Hemoglobin: 13 g/dL (ref 12.0–16.0)
MCH: 33.8 pg (ref 26.0–34.0)
MCH: 33.3 pg (ref 26.0–34.0)
MCHC: 33.9 g/dL (ref 32.0–36.0)
MCHC: 33.7 g/dL (ref 32.0–36.0)
MCV: 99.7 fL (ref 80.0–100.0)
MCV: 99 fL (ref 80.0–100.0)
PLATELETS: 420 10*3/uL (ref 150–440)
Platelets: 264 10*3/uL (ref 150–440)
RBC: 3.84 MIL/uL (ref 3.80–5.20)
RBC: 3.18 MIL/uL — AB (ref 3.80–5.20)
RDW: 18.8 % — ABNORMAL HIGH (ref 11.5–14.5)
RDW: 18.4 % — ABNORMAL HIGH (ref 11.5–14.5)
WBC: 11.9 10*3/uL — ABNORMAL HIGH (ref 3.6–11.0)
WBC: 7.5 10*3/uL (ref 3.6–11.0)

## 2015-04-09 LAB — COMPREHENSIVE METABOLIC PANEL
ALK PHOS: 123 U/L (ref 38–126)
ALT: 11 U/L — ABNORMAL LOW (ref 14–54)
ANION GAP: 12 (ref 5–15)
AST: 28 U/L (ref 15–41)
Albumin: 3.3 g/dL — ABNORMAL LOW (ref 3.5–5.0)
BUN: 39 mg/dL — ABNORMAL HIGH (ref 6–20)
CALCIUM: 8.7 mg/dL — AB (ref 8.9–10.3)
CHLORIDE: 103 mmol/L (ref 101–111)
CO2: 25 mmol/L (ref 22–32)
Creatinine, Ser: 5.09 mg/dL — ABNORMAL HIGH (ref 0.44–1.00)
GFR, EST AFRICAN AMERICAN: 9 mL/min — AB (ref >60)
GFR, EST NON AFRICAN AMERICAN: 8 mL/min — AB (ref >60)
Glucose, Bld: 127 mg/dL — ABNORMAL HIGH (ref 65–99)
Potassium: 3.5 mmol/L (ref 3.5–5.1)
SODIUM: 140 mmol/L (ref 135–145)
Total Bilirubin: 1.3 mg/dL — ABNORMAL HIGH (ref 0.3–1.2)
Total Protein: 7.2 g/dL (ref 6.5–8.1)

## 2015-04-09 LAB — CREATININE, SERUM
CREATININE: 5.2 mg/dL — AB (ref 0.44–1.00)
GFR calc Af Amer: 9 mL/min — ABNORMAL LOW (ref >60)
GFR, EST NON AFRICAN AMERICAN: 8 mL/min — AB (ref >60)

## 2015-04-09 LAB — BLOOD GAS, VENOUS
ACID-BASE EXCESS: 1.7 mmol/L (ref 0.0–3.0)
BICARBONATE: 26.6 meq/L (ref 21.0–28.0)
FIO2: 0.6
MECHANICAL RATE: 12
Mode: POSITIVE
PATIENT TEMPERATURE: 37
PCO2 VEN: 42 mmHg — AB (ref 44.0–60.0)
PEEP: 6 cmH2O
PH VEN: 7.41 (ref 7.320–7.430)
Pressure support: 12 cmH2O

## 2015-04-09 LAB — BRAIN NATRIURETIC PEPTIDE: B NATRIURETIC PEPTIDE 5: 3065 pg/mL — AB (ref 0.0–100.0)

## 2015-04-09 LAB — TROPONIN I
TROPONIN I: 0.07 ng/mL — AB (ref <0.031)
TROPONIN I: 0.11 ng/mL — AB (ref <0.031)
TROPONIN I: 0.13 ng/mL — AB (ref <0.031)
TROPONIN I: 0.08 ng/mL — AB (ref <0.031)

## 2015-04-09 LAB — MRSA PCR SCREENING: MRSA BY PCR: NEGATIVE

## 2015-04-09 LAB — GLUCOSE, CAPILLARY: Glucose-Capillary: 86 mg/dL (ref 65–99)

## 2015-04-09 MED ORDER — ALBUTEROL SULFATE (2.5 MG/3ML) 0.083% IN NEBU: 2.5000 mg | INHALATION_SOLUTION | Freq: Four times a day (QID) | RESPIRATORY_TRACT | Status: DC

## 2015-04-09 MED ORDER — CETYLPYRIDINIUM CHLORIDE 0.05 % MT LIQD
7.0000 mL | Freq: Two times a day (BID) | OROMUCOSAL | Status: DC
  Administered 2015-04-09 – 2015-04-20 (×16): 7 mL via OROMUCOSAL

## 2015-04-09 MED ORDER — ACETAMINOPHEN 325 MG PO TABS
650.0000 mg | ORAL_TABLET | Freq: Four times a day (QID) | ORAL | Status: DC | PRN
  Administered 2015-04-09 – 2015-04-20 (×11): 650 mg via ORAL
  Filled 2015-04-09 (×11): qty 2

## 2015-04-09 MED ORDER — LIDOCAINE 5 % EX PTCH
1.0000 | MEDICATED_PATCH | CUTANEOUS | Status: DC
  Administered 2015-04-09 – 2015-04-11 (×2): 1 via TRANSDERMAL
  Filled 2015-04-09 (×14): qty 1

## 2015-04-09 MED ORDER — ACETAMINOPHEN 500 MG PO TABS
1000.0000 mg | ORAL_TABLET | Freq: Three times a day (TID) | ORAL | Status: DC
  Administered 2015-04-09 – 2015-04-12 (×7): 1000 mg via ORAL
  Filled 2015-04-09 (×7): qty 2

## 2015-04-09 MED ORDER — IPRATROPIUM BROMIDE 0.02 % IN SOLN: 0.5000 mg | Freq: Four times a day (QID) | RESPIRATORY_TRACT | Status: DC

## 2015-04-09 MED ORDER — METOPROLOL TARTRATE 50 MG PO TABS
50.0000 mg | ORAL_TABLET | Freq: Two times a day (BID) | ORAL | Status: DC
  Administered 2015-04-09 – 2015-04-14 (×10): 50 mg via ORAL
  Filled 2015-04-09 (×11): qty 1

## 2015-04-09 MED ORDER — SALINE SPRAY 0.65 % NA SOLN
1.0000 | Freq: Every day | NASAL | Status: DC
  Administered 2015-04-09 – 2015-04-22 (×47): 1 via NASAL
  Filled 2015-04-09 (×2): qty 44

## 2015-04-09 MED ORDER — RENA-VITE PO TABS
1.0000 | ORAL_TABLET | Freq: Every day | ORAL | Status: DC
  Administered 2015-04-10 – 2015-04-22 (×10): 1 via ORAL
  Filled 2015-04-09 (×12): qty 1

## 2015-04-09 MED ORDER — FAMOTIDINE 20 MG PO TABS
20.0000 mg | ORAL_TABLET | Freq: Every day | ORAL | Status: DC
  Administered 2015-04-10 – 2015-04-22 (×10): 20 mg via ORAL
  Filled 2015-04-09 (×11): qty 1

## 2015-04-09 MED ORDER — SODIUM CHLORIDE 0.9 % IJ SOLN
3.0000 mL | Freq: Two times a day (BID) | INTRAMUSCULAR | Status: DC
  Administered 2015-04-09 – 2015-04-21 (×22): 3 mL via INTRAVENOUS

## 2015-04-09 MED ORDER — ONDANSETRON HCL 4 MG/2ML IJ SOLN
4.0000 mg | Freq: Four times a day (QID) | INTRAMUSCULAR | Status: DC | PRN
  Filled 2015-04-09: qty 2

## 2015-04-09 MED ORDER — ONDANSETRON HCL 4 MG PO TABS: 4.0000 mg | ORAL_TABLET | Freq: Four times a day (QID) | ORAL | Status: DC | PRN

## 2015-04-09 MED ORDER — HYDRALAZINE HCL 10 MG PO TABS
10.0000 mg | ORAL_TABLET | Freq: Four times a day (QID) | ORAL | Status: DC
  Administered 2015-04-09 – 2015-04-11 (×6): 10 mg via ORAL
  Filled 2015-04-09 (×10): qty 1

## 2015-04-09 MED ORDER — NITROGLYCERIN 2 % TD OINT
TOPICAL_OINTMENT | TRANSDERMAL | Status: AC
  Administered 2015-04-09: 1 [in_us]
  Filled 2015-04-09: qty 1

## 2015-04-09 MED ORDER — METOPROLOL TARTRATE 25 MG PO TABS
25.0000 mg | ORAL_TABLET | Freq: Once | ORAL | Status: AC
Start: 2015-04-09 — End: 2015-04-09
  Administered 2015-04-09: 25 mg via ORAL
  Filled 2015-04-09: qty 1

## 2015-04-09 MED ORDER — IPRATROPIUM-ALBUTEROL 0.5-2.5 (3) MG/3ML IN SOLN
3.0000 mL | Freq: Four times a day (QID) | RESPIRATORY_TRACT | Status: DC
  Administered 2015-04-09 – 2015-04-20 (×41): 3 mL via RESPIRATORY_TRACT
  Filled 2015-04-09 (×43): qty 3

## 2015-04-09 MED ORDER — ATORVASTATIN CALCIUM 20 MG PO TABS
40.0000 mg | ORAL_TABLET | Freq: Every day | ORAL | Status: DC
  Administered 2015-04-10 – 2015-04-22 (×11): 40 mg via ORAL
  Filled 2015-04-09 (×12): qty 2

## 2015-04-09 MED ORDER — HEPARIN SODIUM (PORCINE) 5000 UNIT/ML IJ SOLN: 5000.0000 [IU] | Freq: Three times a day (TID) | INTRAMUSCULAR | Status: DC

## 2015-04-09 MED ORDER — FLUTICASONE PROPIONATE 50 MCG/ACT NA SUSP
2.0000 | Freq: Every day | NASAL | Status: DC
  Administered 2015-04-10 – 2015-04-21 (×8): 2 via NASAL
  Filled 2015-04-09 (×2): qty 16

## 2015-04-09 MED ORDER — BUDESONIDE-FORMOTEROL FUMARATE 80-4.5 MCG/ACT IN AERO
2.0000 | INHALATION_SPRAY | Freq: Two times a day (BID) | RESPIRATORY_TRACT | Status: DC
  Administered 2015-04-09 – 2015-04-21 (×21): 2 via RESPIRATORY_TRACT
  Filled 2015-04-09 (×4): qty 6.9

## 2015-04-09 MED ORDER — DOCUSATE SODIUM 100 MG PO CAPS
100.0000 mg | ORAL_CAPSULE | Freq: Two times a day (BID) | ORAL | Status: DC
  Administered 2015-04-09 – 2015-04-19 (×18): 100 mg via ORAL
  Filled 2015-04-09 (×18): qty 1

## 2015-04-09 MED ORDER — ASPIRIN EC 81 MG PO TBEC
81.0000 mg | DELAYED_RELEASE_TABLET | Freq: Every day | ORAL | Status: DC
  Administered 2015-04-10 – 2015-04-15 (×5): 81 mg via ORAL
  Filled 2015-04-09 (×5): qty 1

## 2015-04-09 MED ORDER — HEPARIN SODIUM (PORCINE) 5000 UNIT/ML IJ SOLN
5000.0000 [IU] | Freq: Three times a day (TID) | INTRAMUSCULAR | Status: DC
  Administered 2015-04-09: 5000 [IU] via SUBCUTANEOUS
  Filled 2015-04-09: qty 1

## 2015-04-09 MED ORDER — CHLORHEXIDINE GLUCONATE 0.12 % MT SOLN
15.0000 mL | Freq: Two times a day (BID) | OROMUCOSAL | Status: DC
  Administered 2015-04-09 – 2015-04-22 (×23): 15 mL via OROMUCOSAL
  Filled 2015-04-09 (×21): qty 15

## 2015-04-09 MED ORDER — SODIUM CHLORIDE 0.9 % IV SOLN
250.0000 mL | INTRAVENOUS | Status: DC | PRN
  Administered 2015-04-17: 08:00:00 via INTRAVENOUS

## 2015-04-09 MED ORDER — SODIUM CHLORIDE 0.9 % IJ SOLN: 3.0000 mL | INTRAMUSCULAR | Status: DC | PRN

## 2015-04-09 MED ORDER — SODIUM CHLORIDE 0.9 % IJ SOLN
3.0000 mL | Freq: Two times a day (BID) | INTRAMUSCULAR | Status: DC
  Administered 2015-04-09 – 2015-04-21 (×23): 3 mL via INTRAVENOUS

## 2015-04-09 MED ORDER — FUROSEMIDE 10 MG/ML IJ SOLN
INTRAMUSCULAR | Status: AC
Start: 2015-04-09 — End: 2015-04-09
  Administered 2015-04-09: 40 mg via INTRAVENOUS
  Filled 2015-04-09: qty 4

## 2015-04-09 MED ORDER — MAGNESIUM HYDROXIDE 400 MG/5ML PO SUSP: 30.0000 mL | Freq: Every day | ORAL | Status: DC | PRN

## 2015-04-09 MED ORDER — LEVOTHYROXINE SODIUM 88 MCG PO TABS
88.0000 ug | ORAL_TABLET | Freq: Every day | ORAL | Status: DC
  Administered 2015-04-10 – 2015-04-22 (×13): 88 ug via ORAL
  Filled 2015-04-09 (×15): qty 1

## 2015-04-09 MED ORDER — EPOETIN ALFA 4000 UNIT/ML IJ SOLN
4000.0000 [IU] | INTRAMUSCULAR | Status: DC
  Administered 2015-04-13 – 2015-04-18 (×3): 4000 [IU] via INTRAVENOUS
  Filled 2015-04-09 (×4): qty 1

## 2015-04-09 NOTE — Progress Notes (Signed)
Hemodialysis tx completed. 
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
## 2015-04-09 NOTE — ED Notes (Signed)
Dr V made aware that pt is on bipap and cannot have esophageal study STAT. Dr V said the test needs to be done after pt is weaned from bipap. Radiology aware.

## 2015-04-09 NOTE — ED Notes (Signed)
Pt resting in bed with eyes closed, no distress noted.

## 2015-04-09 NOTE — Progress Notes (Signed)
Pre-hd tx 

## 2015-04-09 NOTE — Progress Notes (Signed)
2L removed in dialysis, pt weaned to [facility], sats high 90's.

## 2015-04-09 NOTE — Progress Notes (Signed)
Post hd tx 

## 2015-04-09 NOTE — Progress Notes (Signed)
RT to room to give neb treatment, RN Margreta Journey had removed patient from [place] after completion of dialysis.  Patient feeling much better on nasal cannula.  Tolerating well at this time, will continue to monitor.
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
## 2015-04-09 NOTE — Progress Notes (Signed)
Notified Dr. Ether Griffins of PAT rate of 151. One time metoprolol 25 mg ordered. Possible hematoma R groin per [place], DC heparin and consult vascular.

## 2015-04-09 NOTE — ED Notes (Signed)
MD at bedside. 

## 2015-04-09 NOTE — Progress Notes (Signed)
[name] is a 65 y.o. female  [id number]  Primary Cardiologist: Neoma Laming Reason for Consultation: CHF  HPI: This is a 65 year old white female with a past medical history of COPD hypertension hyperlipidemia status post triple a repair after rupture in November 18 last year renal failure three-vessel CABG admitted to the hospital because of shortness of breath and hypoxia from nursing home. Patient states she is not having any chest pain but is very short of breath and is getting dialysis at this time.   Review of Systems: No chest pain   Past Medical History  Diagnosis Date  . Hyperlipidemia   . Hypertension   . Thyroid disease   . GERD (gastroesophageal reflux disease)   . Aneurysm ([place])     abdominal aortic  . STD (sexually transmitted disease)     chlamydia  . VAIN II (vaginal intraepithelial neoplasia grade II) 7/09  . 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 B12 deficiency   . Cancer ([place])     skin on left ankle  . COPD (chronic obstructive pulmonary disease) ([facility])     Medications Prior to Admission  Medication Sig Dispense Refill  . acetaminophen (TYLENOL) 500 MG tablet Take 1,000 mg by mouth every 8 (eight) hours.    Marland Kitchen albuterol (PROVENTIL) (2.5 MG/3ML) 0.083% nebulizer solution Take 2.5 mg by nebulization every 6 (six) hours.    Marland Kitchen albuterol (PROVENTIL) (2.5 MG/3ML) 0.083% nebulizer solution Take 2.5 mg by nebulization every 6 (six) hours as needed for wheezing.    Marland Kitchen aspirin EC 81 MG tablet Take 81 mg by mouth daily.    Marland Kitchen atorvastatin (LIPITOR) 40 MG tablet Take 40 mg by mouth daily.    . budesonide-formoterol (SYMBICORT) 80-4.5 MCG/ACT inhaler Inhale 2 puffs into the lungs 2 (two) times daily.    Marland Kitchen docusate sodium (COLACE) 100 MG capsule Take 100 mg by mouth 2 (two) times daily.     . famotidine (PEPCID) 20 MG tablet Take 20 mg by mouth daily.    . fluticasone (FLONASE) 50 MCG/ACT nasal spray Place 2 sprays into both nostrils daily.     . heparin 5000 UNIT/ML injection Inject 5,000 Units into the skin every 8 (eight) hours.    . hydrALAZINE (APRESOLINE) 10 MG tablet Take 10 mg by mouth every 6 (six) hours.    Marland Kitchen ipratropium (ATROVENT) 0.02 % nebulizer solution Take 0.5 mg by nebulization 4 (four) times daily.    Marland Kitchen levothyroxine (SYNTHROID, LEVOTHROID) 88 MCG tablet Take 88 mcg by mouth daily.    Marland Kitchen lidocaine (LIDODERM) 5 % Place 1 patch onto the skin daily. Remove & Discard patch within 12 hours or as directed by MD    . magnesium hydroxide (MILK OF MAGNESIA) 400 MG/5ML suspension Take 30 mLs by mouth daily as needed for mild constipation.    . metoprolol (LOPRESSOR) 50 MG tablet Take 50 mg by mouth 2 (two) times daily.    . multivitamin (RENA-VIT) TABS tablet Take 1 tablet by mouth daily.    . ondansetron (ZOFRAN) 4 MG tablet Take 4 mg by mouth every 6 (six) hours as needed for nausea or vomiting.    . promethazine (PHENERGAN) 25 MG/ML injection Inject 25 mg into the vein every 6 (six) hours as needed for nausea or vomiting.    . sodium chloride (OCEAN) 0.65 % SOLN nasal spray Place 1 spray into both nostrils 5 (five) times daily.       Marland Kitchen acetaminophen  1,000 mg Oral 3 times per day  . aspirin EC  81 mg Oral Daily  . atorvastatin  40 mg Oral Daily  . budesonide-formoterol  2 puff Inhalation BID  . docusate sodium  100 mg Oral BID  . [START ON 04/11/2015] epoetin (EPOGEN/PROCRIT) injection  4,000 Units Intravenous Q T,Th,Sa-HD  . famotidine  20 mg Oral Daily  . fluticasone  2 spray Each Nare Daily  . heparin  5,000 Units Subcutaneous 3 times per day  . hydrALAZINE  10 mg Oral Q6H  . ipratropium-albuterol  3 mL Nebulization Q6H  . levothyroxine  88 mcg Oral Daily  . lidocaine  1 patch Transdermal Q24H  . metoprolol  50 mg Oral BID  . multivitamin  1 tablet Oral Daily  . sodium chloride  1 spray Each Nare 5 X Daily  . sodium chloride  3 mL Intravenous Q12H  . sodium chloride  3 mL Intravenous Q12H    Infusions:     No Known Allergies  Social History   Social History  . Marital Status: Married    Spouse Name: N/A  . Number of Children: N/A  . Years of Education: N/A   Occupational History  . Administrative Asst-out of work    Social History Main Topics  . Smoking status: Former Smoker -- 0.00 packs/day for 45 years    Quit date: 02/15/2015  . Smokeless tobacco: Never Used  . Alcohol Use: No  . Drug Use: No  . Sexual Activity:    Partners: Male    Birth Control/ Protection: Post-menopausal, Surgical     Comment: TAH 1990   Other Topics Concern  . Not on file   Social History Narrative   Does not have a living will.   Desires CPR and life support if not futile.    Family History  Problem Relation Age of Onset  . Hypertension Other   . Hypertension Mother   . Stroke Mother   . Heart attack Father   . Cancer Sister     uterine cancer, removed ovaries  . Hypertension Sister     PHYSICAL EXAM: Filed Vitals:   04/09/15 1300 04/09/15 1315  BP: 93/67 107/71  Pulse: 109 109  Temp:    Resp: 27 24    No intake or output data in the 24 hours ending 04/09/15 1317  General:  Well appearing. No respiratory difficulty HEENT: normal Neck: supple. no JVD. Carotids 2+ bilat; no bruits. No lymphadenopathy or thryomegaly appreciated. Cor: PMI nondisplaced. Regular rate & rhythm. No rubs, gallops or murmurs. Lungs: clear Abdomen: soft, nontender, nondistended. No hepatosplenomegaly. No bruits or masses. Good bowel sounds. Extremities: no cyanosis, clubbing, rash, edema Neuro: alert & oriented x 3, cranial nerves grossly intact. moves all 4 extremities w/o difficulty. Affect pleasant.  ECG: Sinus rhythm with no significant ST depression but monitor shows atrial fibrillation  Results for orders placed or performed during the hospital encounter of 04/09/15 (from the past 24 hour(s))  Blood gas, venous     Status: Abnormal   Collection Time: 04/09/15  5:30 AM  Result Value Ref Range    FIO2 0.60    Mode BILEVEL POSITIVE AIRWAY PRESSURE    Peep/cpap 6.0 cm H20   Pressure support 12 cm H20   pH, Ven 7.41 7.320 - 7.430   pCO2, Ven 42 (L) 44.0 - 60.0 mmHg   Bicarbonate 26.6 21.0 - 28.0 mEq/L   Acid-Base Excess 1.7 0.0 - 3.0 mmol/L   Patient temperature 37.0  Collection site VENOUS    Sample type VENOUS    Mechanical Rate 12   CBC     Status: Abnormal   Collection Time: 04/09/15  5:32 AM  Result Value Ref Range   WBC 11.9 (H) 3.6 - 11.0 K/uL   RBC 3.84 3.80 - 5.20 MIL/uL   Hemoglobin 13.0 12.0 - 16.0 g/dL   HCT 38.3 35.0 - 47.0 %   MCV 99.7 80.0 - 100.0 fL   MCH 33.8 26.0 - 34.0 pg   MCHC 33.9 32.0 - 36.0 g/dL   RDW 18.8 (H) 11.5 - 14.5 %   Platelets 420 150 - 440 K/uL  Comprehensive metabolic panel     Status: Abnormal   Collection Time: 04/09/15  5:32 AM  Result Value Ref Range   Sodium 140 135 - 145 mmol/L   Potassium 3.5 3.5 - 5.1 mmol/L   Chloride 103 101 - 111 mmol/L   CO2 25 22 - 32 mmol/L   Glucose, Bld 127 (H) 65 - 99 mg/dL   BUN 39 (H) 6 - 20 mg/dL   Creatinine, Ser 5.09 (H) 0.44 - 1.00 mg/dL   Calcium 8.7 (L) 8.9 - 10.3 mg/dL   Total Protein 7.2 6.5 - 8.1 g/dL   Albumin 3.3 (L) 3.5 - 5.0 g/dL   AST 28 15 - 41 U/L   ALT 11 (L) 14 - 54 U/L   Alkaline Phosphatase 123 38 - 126 U/L   Total Bilirubin 1.3 (H) 0.3 - 1.2 mg/dL   GFR calc non Af Amer 8 (L) >60 mL/min   GFR calc Af Amer 9 (L) >60 mL/min   Anion gap 12 5 - 15  Troponin I     Status: Abnormal   Collection Time: 04/09/15  5:32 AM  Result Value Ref Range   Troponin I 0.07 (H) <0.031 ng/mL  Brain natriuretic peptide     Status: Abnormal   Collection Time: 04/09/15  5:32 AM  Result Value Ref Range   B Natriuretic Peptide 3065.0 (H) 0.0 - 100.0 pg/mL  MRSA PCR Screening     Status: None   Collection Time: 04/09/15  9:45 AM  Result Value Ref Range   MRSA by PCR NEGATIVE NEGATIVE  Glucose, capillary     Status: None   Collection Time: 04/09/15  9:48 AM  Result Value Ref Range    Glucose-Capillary 86 65 - 99 mg/dL  CBC     Status: Abnormal   Collection Time: 04/09/15 10:13 AM  Result Value Ref Range   WBC 7.5 3.6 - 11.0 K/uL   RBC 3.18 (L) 3.80 - 5.20 MIL/uL   Hemoglobin 10.6 (L) 12.0 - 16.0 g/dL   HCT 31.5 (L) 35.0 - 47.0 %   MCV 99.0 80.0 - 100.0 fL   MCH 33.3 26.0 - 34.0 pg   MCHC 33.7 32.0 - 36.0 g/dL   RDW 18.4 (H) 11.5 - 14.5 %   Platelets 264 150 - 440 K/uL  Creatinine, serum     Status: Abnormal   Collection Time: 04/09/15 10:13 AM  Result Value Ref Range   Creatinine, Ser 5.20 (H) 0.44 - 1.00 mg/dL   GFR calc non Af Amer 8 (L) >60 mL/min   GFR calc Af Amer 9 (L) >60 mL/min  Troponin I     Status: Abnormal   Collection Time: 04/09/15 10:13 AM  Result Value Ref Range   Troponin I 0.11 (H) <0.031 ng/mL   US Venous Img Lower Unilateral Right  04/09/2015  CLINICAL DATA:  Right lower extremity swelling. History of aortic rupture and repair in November 2016. EXAM: RIGHT LOWER EXTREMITY VENOUS DOPPLER ULTRASOUND TECHNIQUE: Gray-scale sonography with graded compression, as well as color Doppler and duplex ultrasound, were performed to evaluate the deep venous system from the level of the common femoral vein through the popliteal and proximal calf veins. Spectral Doppler was utilized to evaluate flow at rest and with distal augmentation maneuvers. COMPARISON:  None. FINDINGS: Left common femoral vein is patent without thrombus. Large hypoechoic fluid collection in the right groin that measures 5.8 x 5.7 x 2.7 cm. Large fluid collection is mildly complex. No definite blood flow within this large fluid collection. Normal compressibility, augmentation and color Doppler flow in the right common femoral vein, right femoral vein and right popliteal vein. The right saphenofemoral junction is patent. Visualized right deep calf veins are patent without thrombus. Mild subcutaneous edema in the right calf. Right profunda femoral vein is patent without thrombus. IMPRESSION:  Negative for deep venous thrombosis in the right lower extremity. Large fluid collection in the right groin. No blood flow within this large fluid collection. Findings may be represent a postoperative fluid collection or postoperative hematoma. Electronically Signed   By: Markus Daft M.D.   On: 04/09/2015 12:43   Dg Chest Portable 1 View  04/09/2015  CLINICAL DATA:  65 year old female with shortness of breath EXAM: PORTABLE CHEST 1 VIEW COMPARISON:  Chest CT dated 07/19/2014 FINDINGS: Single-view of the chest demonstrates emphysematous changes of the lungs with bibasilar atelectasis/ scarring. There is diffuse interstitial prominence predominantly involving the lower lung fields compatible with a degree of fluid overload. No significant pleural effusion. No pneumothorax. The cardiac silhouette is within normal limits. A dialysis catheter noted with tip at the cavoatrial junction. The osseous structures appear grossly unremarkable. IMPRESSION: Emphysema with increased interstitial prominence likely a degree of fluid overload. Superimposed pneumonia is not excluded. Clinical correlation is recommended. Electronically Signed   By: Anner Crete M.D.   On: 04/09/2015 05:55     ASSESSMENT AND PLAN: Respiratory failure with elevated BNP to 3065 and mildly elevated troponin along with renal failure. Patient is currently on BiPAP and dialysis. Troponin is 0.07 and 0.11 with no acute EKG changes except sinus tachycardia and old anteroseptal wall MI. We will get an echocardiogram to further evaluate patient's ejection fraction and or any any valvular abnormalities.  Maryjane Benedict A
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
## 2015-04-09 NOTE — ED Notes (Addendum)
Husband at bedside. Per pt and husband, pt had heart surgery/catheterization at [facility] back in November. Pt still has some blockages in chest, but husband stated that [facility] said pt was too weak at time to do another procedure and would wait. Pt and husband are worried about heart.  Both were informed of tests performed so far and reassured. Pt states she hasn't been to dialysis since last Thursday. Per husband, pt missed Saturday's dialysis appointment. Pt states she does make a little bit of urine. Pt is still in rehab from surgery in November and states she is not currently walking, but "not there yet".

## 2015-04-09 NOTE — ED Notes (Signed)
Pt taken to dialysis, Nursing sup. Requested to take pt to CCU for dialysis, nurse aware, Dr aware.

## 2015-04-09 NOTE — Progress Notes (Signed)
*  PRELIMINARY RESULTS* Echocardiogram 2D Echocardiogram has been performed.  [name] 04/09/2015, 7:59 PM
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
## 2015-04-09 NOTE — Progress Notes (Signed)
Hemodialysis treatment start 
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
## 2015-04-10 ENCOUNTER — Inpatient Hospital Stay: Payer: Medicare Other

## 2015-04-10 ENCOUNTER — Encounter: Payer: Self-pay | Admitting: Radiology

## 2015-04-10 LAB — BASIC METABOLIC PANEL
ANION GAP: 9 (ref 5–15)
BUN: 27 mg/dL — ABNORMAL HIGH (ref 6–20)
CALCIUM: 8.3 mg/dL — AB (ref 8.9–10.3)
CHLORIDE: 101 mmol/L (ref 101–111)
CO2: 28 mmol/L (ref 22–32)
Creatinine, Ser: 3.82 mg/dL — ABNORMAL HIGH (ref 0.44–1.00)
GFR calc Af Amer: 13 mL/min — ABNORMAL LOW (ref >60)
GFR calc non Af Amer: 11 mL/min — ABNORMAL LOW (ref >60)
GLUCOSE: 74 mg/dL (ref 65–99)
Potassium: 3.7 mmol/L (ref 3.5–5.1)
Sodium: 138 mmol/L (ref 135–145)

## 2015-04-10 LAB — CBC
HCT: 30.7 % — ABNORMAL LOW (ref 35.0–47.0)
HEMOGLOBIN: 10.2 g/dL — AB (ref 12.0–16.0)
MCH: 32.4 pg (ref 26.0–34.0)
MCHC: 33.1 g/dL (ref 32.0–36.0)
MCV: 98 fL (ref 80.0–100.0)
Platelets: 134 10*3/uL — ABNORMAL LOW (ref 150–440)
RBC: 3.13 MIL/uL — ABNORMAL LOW (ref 3.80–5.20)
RDW: 18.3 % — ABNORMAL HIGH (ref 11.5–14.5)
WBC: 5.6 10*3/uL (ref 3.6–11.0)

## 2015-04-10 LAB — HEPATITIS B SURFACE ANTIGEN: HEP B S AG: NEGATIVE

## 2015-04-10 LAB — HEPATITIS B SURFACE ANTIBODY, QUANTITATIVE: Hepatitis B-Post: 10.2 m[IU]/mL (ref >9.9)

## 2015-04-10 MED ORDER — IOHEXOL 300 MG/ML  SOLN
75.0000 mL | Freq: Once | INTRAMUSCULAR | Status: AC | PRN
  Administered 2015-04-10: 75 mL via INTRAVENOUS
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
## 2015-04-10 NOTE — Progress Notes (Signed)
SUBJECTIVE: Patient appears to be much more comfortable now   Filed Vitals:   04/10/15 0100 04/10/15 0150 04/10/15 0200 04/10/15 0300  BP: 131/91  138/57 148/54  Pulse: 76  64 65  Temp:      TempSrc:      Resp: 19  13 12   Height:      Weight:      SpO2: 98% 98% 96% 98%    Intake/Output Summary (Last 24 hours) at 04/10/15 0339 Last data filed at 04/09/15 1334  Gross per 24 hour  Intake      0 ml  Output   2000 ml  Net  -2000 ml    LABS: Basic Metabolic Panel:  Recent Labs  04/09/15 0532 04/09/15 1013  NA 140  --   K 3.5  --   CL 103  --   CO2 25  --   GLUCOSE 127*  --   BUN 39*  --   CREATININE 5.09* 5.20*  CALCIUM 8.7*  --    Liver Function Tests:  Recent Labs  04/09/15 0532  AST 28  ALT 11*  ALKPHOS 123  BILITOT 1.3*  PROT 7.2  ALBUMIN 3.3*   No results for input(s): LIPASE, AMYLASE in the last 72 hours. CBC:  Recent Labs  04/09/15 0532 04/09/15 1013  WBC 11.9* 7.5  HGB 13.0 10.6*  HCT 38.3 31.5*  MCV 99.7 99.0  PLT 420 264   Cardiac Enzymes:  Recent Labs  04/09/15 1013 04/09/15 1644 04/09/15 2249  TROPONINI 0.11* 0.13* 0.08*   BNP: Invalid input(s): POCBNP D-Dimer: No results for input(s): DDIMER in the last 72 hours. Hemoglobin A1C: No results for input(s): HGBA1C in the last 72 hours. Fasting Lipid Panel: No results for input(s): CHOL, HDL, LDLCALC, TRIG, CHOLHDL, LDLDIRECT in the last 72 hours. Thyroid Function Tests: No results for input(s): TSH, T4TOTAL, T3FREE, THYROIDAB in the last 72 hours.  Invalid input(s): FREET3 Anemia Panel: No results for input(s): VITAMINB12, FOLATE, FERRITIN, TIBC, IRON, RETICCTPCT in the last 72 hours.   PHYSICAL EXAM General: Well developed, well nourished, in no acute distress HEENT:  Normocephalic and atramatic Neck:  No JVD.  Lungs: Clear bilaterally to auscultation and percussion. Heart: HRRR . Normal S1 and S2 without gallops or murmurs.  Abdomen: Bowel sounds are positive, abdomen  soft and non-tender  Msk:  Back normal, normal gait. Normal strength and tone for age. Extremities: No clubbing, cyanosis or edema.   Neuro: Alert and oriented X 3. Psych:  Good affect, responds appropriately  TELEMETRY: Sinus rhythm  ASSESSMENT AND PLAN: Patient has normal left reticular systolic function with LVH and diastolic dysfunction. Currently sinus rhythm and comfortable, advise continuation of current treatment with dialysis to remove excess fluid. She has mildly elevated troponin which is probably related to renal failure and demand ischemia and has no chest pain.  Principal Problem:   Acute pulmonary edema with congestive heart failure (HCC) Active Problems:   Leukocytosis   Malignant essential hypertension   Dysphagia   Elevated troponin   Acute pulmonary edema (HCC)    Neoma Laming A, MD, [id number] [facility] 04/10/2015 3:39 AM

## 2015-04-10 NOTE — Progress Notes (Signed)
Subjective:  Patient feels better today UF 2000 cc Room air at present Scheduled for CT later today   Objective:  Vital signs in last 24 hours:  Temp:  [97.6 F (36.4 C)-98.6 F (37 C)] 98.4 F (36.9 C) (01/11 0814) Pulse Rate:  [64-115] 70 (01/11 0800) Resp:  [9-28] 14 (01/11 0800) BP: (82-170)/(54-93) 165/68 mmHg (01/11 0800) SpO2:  [94 %-100 %] 94 % (01/11 0846) FiO2 (%):  [21 %-40 %] 21 % (01/11 0846) Weight:  [60 kg (132 lb 4.4 oz)] 60 kg (132 lb 4.4 oz) (01/10 1334)  Weight change: -7.492 kg (-16 lb 8.3 oz) Filed Weights   04/09/15 0523 04/09/15 0940 04/09/15 1334  Weight: 68.992 kg (152 lb 1.6 oz) 61.5 kg (135 lb 9.3 oz) 60 kg (132 lb 4.4 oz)    Intake/Output:    Intake/Output Summary (Last 24 hours) at 04/10/15 0946 Last data filed at 04/09/15 1334  Gross per 24 hour  Intake      0 ml  Output   2000 ml  Net  -2000 ml     Physical Exam: General: NAD  HEENT Anicteric, moist oral mucus membranes  Neck supple  Pulm/lungs Scattered rhonchi otherwise clear, normal resp effort on room air  CVS/Heart Irregular, no rub  Abdomen:  Soft, NT, scars from previous surgeries  Extremities: No peripheral edema  Neurologic: Alert, follows commands, wheelchair dependent at home  Skin: No acute rashes  Access: Rt IJ PC       Basic Metabolic Panel:   Recent Labs Lab 04/09/15 0532 04/09/15 1013 04/10/15 0817  NA 140  --  138  K 3.5  --  3.7  CL 103  --  101  CO2 25  --  28  GLUCOSE 127*  --  74  BUN 39*  --  27*  CREATININE 5.09* 5.20* 3.82*  CALCIUM 8.7*  --  8.3*     CBC:  Recent Labs Lab 04/09/15 0532 04/09/15 1013 04/10/15 0817  WBC 11.9* 7.5 5.6  HGB 13.0 10.6* 10.2*  HCT 38.3 31.5* 30.7*  MCV 99.7 99.0 98.0  PLT 420 264 134*      Microbiology:  Recent Results (from the past 720 hour(s))  MRSA PCR Screening     Status: None   Collection Time: 04/09/15  9:45 AM  Result Value Ref Range Status   MRSA by PCR NEGATIVE NEGATIVE Final     Comment:        The GeneXpert MRSA Assay (FDA approved for NASAL specimens only), is one component of a comprehensive MRSA colonization surveillance program. It is not intended to diagnose MRSA infection nor to guide or monitor treatment for MRSA infections.     Coagulation Studies: No results for input(s): LABPROT, INR in the last 72 hours.  Urinalysis: No results for input(s): COLORURINE, LABSPEC, PHURINE, GLUCOSEU, HGBUR, BILIRUBINUR, KETONESUR, PROTEINUR, UROBILINOGEN, NITRITE, LEUKOCYTESUR in the last 72 hours.  Invalid input(s): APPERANCEUR    Imaging: US Venous Img Lower Unilateral Right  04/09/2015  CLINICAL DATA:  Right lower extremity swelling. History of aortic rupture and repair in November 2016. EXAM: RIGHT LOWER EXTREMITY VENOUS DOPPLER ULTRASOUND TECHNIQUE: Gray-scale sonography with graded compression, as well as color Doppler and duplex ultrasound, were performed to evaluate the deep venous system from the level of the common femoral vein through the popliteal and proximal calf veins. Spectral Doppler was utilized to evaluate flow at rest and with distal augmentation maneuvers. COMPARISON:  None. FINDINGS: Left common femoral vein is patent  without thrombus. Large hypoechoic fluid collection in the right groin that measures 5.8 x 5.7 x 2.7 cm. Large fluid collection is mildly complex. No definite blood flow within this large fluid collection. Normal compressibility, augmentation and color Doppler flow in the right common femoral vein, right femoral vein and right popliteal vein. The right saphenofemoral junction is patent. Visualized right deep calf veins are patent without thrombus. Mild subcutaneous edema in the right calf. Right profunda femoral vein is patent without thrombus. IMPRESSION: Negative for deep venous thrombosis in the right lower extremity. Large fluid collection in the right groin. No blood flow within this large fluid collection. Findings may be represent a  postoperative fluid collection or postoperative hematoma. Electronically Signed   By: Markus Daft M.D.   On: 04/09/2015 12:43   Dg Chest Port 1 View  04/10/2015  CLINICAL DATA:  65 year old female with shortness of breath and COPD. EXAM: PORTABLE CHEST 1 VIEW COMPARISON:  Radiograph dated 04/09/2015 FINDINGS: Right medial dialysis catheter in stable position. Single-view of the chest demonstrates emphysematous changes of the lungs with bibasilar atelectatic changes. There is prominence of the right hilar vasculature. Trace pleural effusion may be present. Findings may represent a degree of fluid overload. Pneumonia is not excluded. Clinical correlation is recommended. The cardiac silhouette is within normal limits. The osseous structures appear unremarkable. IMPRESSION: Emphysema with probable degree of congestion or fluid overload. Pneumonia is not excluded. Electronically Signed   By: Anner Crete M.D.   On: 04/10/2015 06:40   Dg Chest Portable 1 View  04/09/2015  CLINICAL DATA:  65 year old female with shortness of breath EXAM: PORTABLE CHEST 1 VIEW COMPARISON:  Chest CT dated 07/19/2014 FINDINGS: Single-view of the chest demonstrates emphysematous changes of the lungs with bibasilar atelectasis/ scarring. There is diffuse interstitial prominence predominantly involving the lower lung fields compatible with a degree of fluid overload. No significant pleural effusion. No pneumothorax. The cardiac silhouette is within normal limits. A dialysis catheter noted with tip at the cavoatrial junction. The osseous structures appear grossly unremarkable. IMPRESSION: Emphysema with increased interstitial prominence likely a degree of fluid overload. Superimposed pneumonia is not excluded. Clinical correlation is recommended. Electronically Signed   By: Anner Crete M.D.   On: 04/09/2015 05:55     Medications:     . acetaminophen  1,000 mg Oral 3 times per day  . antiseptic oral rinse  7 mL Mouth Rinse  q12n4p  . aspirin EC  81 mg Oral Daily  . atorvastatin  40 mg Oral Daily  . budesonide-formoterol  2 puff Inhalation BID  . chlorhexidine  15 mL Mouth Rinse BID  . docusate sodium  100 mg Oral BID  . [START ON 04/11/2015] epoetin (EPOGEN/PROCRIT) injection  4,000 Units Intravenous Q T,Th,Sa-HD  . famotidine  20 mg Oral Daily  . fluticasone  2 spray Each Nare Daily  . hydrALAZINE  10 mg Oral Q6H  . ipratropium-albuterol  3 mL Nebulization Q6H  . levothyroxine  88 mcg Oral Daily  . lidocaine  1 patch Transdermal Q24H  . metoprolol  50 mg Oral BID  . multivitamin  1 tablet Oral Daily  . sodium chloride  1 spray Each Nare 5 X Daily  . sodium chloride  3 mL Intravenous Q12H  . sodium chloride  3 mL Intravenous Q12H   sodium chloride, acetaminophen, magnesium hydroxide, ondansetron **OR** ondansetron (ZOFRAN) IV, ondansetron, sodium chloride  Assessment/ Plan:  65 y.o. female with a PMHX of medical problems of COPD, hypertension, hyperlipidemia,  vaginal intraepithelial neoplasia, emergent AAA repair status post rupture on November 18, left to right femoral to femoral bypass , acute renal failure leading to permanent dialysis, significant 3 vessel disease, was admitted on 04/09/2015 with severe shortness of breath after missing dialysis due to weather conditions.   1. End-stage renal disease. [place]. TTS. [facility] nephrology Plan for scheduled HD tomorrow  2. Acute pulmonary edema, requiring NIPPV Emergent dialysis required for volume removal. 2000 cc removed 1/10 Clinically improved today  3. Peripheral edema Fluid removal with dialysis as tolerated  4. Anemia of chronic kidney disease Hemoglobin 10.2. Low dose procrit with HD - low dose Procrit    LOS: 1 Shabree Tebbetts 1/11/20179:46 AM

## 2015-04-10 NOTE — Progress Notes (Signed)
Initial Nutrition Assessment      INTERVENTION:   Meals and Snacks: Cater to patient preferences; pt may benefit from smaller, more frequent meals Medical Food Supplement Therapy: pt does not like liquid supplements like Ensure/Boost/Nepro; will send Magic Cup for pt to try.  NUTRITION DIAGNOSIS:   Inadequate oral intake related to chronic illness, poor appetite as evidenced by estimated needs, per patient/family report.  GOAL:   Patient will meet greater than or equal to 90% of their needs  MONITOR:    (Energy Intake, Anthropometrics, Digestive System, Electroyte/Renal Profile)  REASON FOR ASSESSMENT:   Diagnosis    ASSESSMENT:    Pt admitted with acute respiratory failure with acute pulmonary edema due to acute CHF, missed HD. Pt also with complaints of dysphagia-barium swallow normal, passed SLP bedside evaluation today. Pt with malignant HTN  Past Medical History  Diagnosis Date  . Hyperlipidemia   . Hypertension   . Thyroid disease   . GERD (gastroesophageal reflux disease)   . Aneurysm ([place])     abdominal aortic  . STD (sexually transmitted disease)     chlamydia  . VAIN II (vaginal intraepithelial neoplasia grade II) 7/09  . 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 B12 deficiency   . COPD (chronic obstructive pulmonary disease) ([place])   . Renal insufficiency   . Cancer ([place])     skin on left ankle 04/10/15 pt states she has never had cancer     Diet Order:  Diet renal with fluid restriction Fluid restriction:: 1200 mL Fluid; Room service appropriate?: Yes; Fluid consistency:: Thin   Energy Intake: pt NPO for tests this AM, NPO on visit today until seen by SLP  Food and Nutrition Related History: pt reports she eats some of the 3 meals per day provided at the facility where she resides currently; pt reports she probably eats between 25-50% of meal trays. No nutritional supplements  Skin:   (no pressure ulcer)  Electrolyte and Renal  Profile:  Recent Labs Lab 04/09/15 0532 04/09/15 1013 04/10/15 0817  BUN 39*  --  27*  CREATININE 5.09* 5.20* 3.82*  NA 140  --  138  K 3.5  --  3.7   Glucose Profile:  Recent Labs  04/09/15 0948  GLUCAP 86   Meds: MVI  Nutrition Focused Physical Exam:  Unable to complete Nutrition-Focused physical exam at this time. Pt looking forward to working with SLP so that she may have something to drink as NPO all day today, SLP arrives during assessment. Deffered physical exam until follow-up  Height:   Ht Readings from Last 1 Encounters:  04/09/15 5\' 4"  (1.626 m)    Weight:   Wt Readings from Last 1 Encounters:  04/09/15 132 lb 4.4 oz (60 kg)    Filed Weights   04/09/15 0523 04/09/15 0940 04/09/15 1334  Weight: 152 lb 1.6 oz (68.992 kg) 135 lb 9.3 oz (61.5 kg) 132 lb 4.4 oz (60 kg)   Wt Readings from Last 10 Encounters:  04/09/15 132 lb 4.4 oz (60 kg)  02/15/15 137 lb 9.1 oz (62.4 kg)  01/29/15 117 lb (53.071 kg)  01/23/15 117 lb 12 oz (53.411 kg)  01/03/15 117 lb 12 oz (53.411 kg)  11/28/14 114 lb (51.71 kg)  09/24/14 112 lb 8 oz (51.03 kg)  07/16/14 110 lb (49.896 kg)  03/21/14 126 lb (57.153 kg)  11/22/13 128 lb (58.06 kg)    BMI:  Body mass index is 22.69 kg/(m^2).  Estimated Nutritional Needs:   Kcal:  [id number] kcals (BEE 1155, 1.3 AF, 1.1-1.3 IF)   Protein:  72-90g (1.2-1.5 g/kg)   Fluid:  1000 mL plus [place] MS, RD, LDN [phone] Pager  [phone] Weekend/On-Call Pager

## 2015-04-10 NOTE — Progress Notes (Signed)
Initial appointment made at the [facility] on May 03, 2015 at 9:00am. Thank you.

## 2015-04-10 NOTE — Progress Notes (Signed)
Just now able to give patient her medications since she is cleared by speech therapy to have medicines take whole with applesauce per Barnetta Chapel with Speech therapy.

## 2015-04-10 NOTE — Discharge Instructions (Signed)
[facility] Failure Clinic appointment on May 03, 2015 at 9:00am with Darylene Price, [place]. Please call [phone] to reschedule.

## 2015-04-10 NOTE — Clinical Social Work Note (Signed)
Clinical Social Work Assessment  Patient Details  Name: [name] MRN: [id number] Date of Birth: [birth year]  Date of referral:  04/10/15               Reason for consult:  Facility Placement, Other (Comment Required) (From [name] (SNF) )                Permission sought to share information with:  Facility Sport and exercise psychologist, Family Supports Permission granted to share information::  Yes, Verbal Permission Granted  Name::      Jamie::   [name]   Relationship::     Contact Information:     Housing/Transportation Living arrangements for the past 2 months:  [place], [place] of Information:  Patient Patient Interpreter Needed:  None Criminal Activity/Legal Involvement Pertinent to Current Situation/Hospitalization:  No - Comment as needed Significant Relationships:  Adult Children Lives with:  Facility Resident, Spouse Do you feel safe going back to the place where you live?  Yes Need for family participation in patient care:  Yes (Comment)  Care giving concerns: Patient is a short term rehab resident at [facility].    Social Worker assessment / plan: Holiday representative ([name]) received call from [employer] at [facility] stating that patient is from her facility. Per [name] patient is a short term rehab resident and will likely transition to long term care. Per [name] patient can return when stable. CSW contacted patient's husband however he did not answer and no voicemail was set up. CSW met with patient alone at bedside. Patient was sitting up in bed and was alert and oriented. CSW introduced self and explained role of CSW department. Patient reported that she has been at [facility] for 3 weeks. Per patient she had an aneursym and went to [facility] and then went to rehab at [place]. Per patient she lives with her husband [name] in [place].  Per patient she has 1 son [name] who lives in [place] and provides support. Patient is agreeable to return to [facility].   FL2 complete and faxed out.    Employment status:  Disabled (Comment on whether or not currently receiving Disability), Retired Forensic scientist:  Medicare PT Recommendations:  Not assessed at this time Information / Referral to community resources:  [name]  Patient/Family's Response to care:  Patient is agreeable to return to [facility].   Patient/Family's Understanding of and Emotional Response to Diagnosis, Current Treatment, and Prognosis:  Patient was pleasant throughout assessment and thanked CSW for visit.   Emotional Assessment Appearance:  Appears stated age Attitude/Demeanor/Rapport:    Affect (typically observed):  Accepting, Adaptable, Pleasant Orientation:  Oriented to Self, Oriented to Place, Oriented to  Time, Oriented to Situation Alcohol / Substance use:  Not Applicable Psych involvement (Current and /or in the community):  No (Comment)  Discharge Needs  Concerns to be addressed:  Discharge Planning Concerns Readmission within the last 30 days:  No Current discharge risk:  Dependent with Mobility, Chronically ill Barriers to Discharge:  Continued Medical Work up   Jamie Fresh[place], LCSW 04/10/2015, 3:04 PM

## 2015-04-10 NOTE — Progress Notes (Signed)
[facility] [place] NAME: [name]    MR#:  [id number]  DATE OF BIRTH:  1951/02/20  SUBJECTIVE:  CHIEF COMPLAINT:   Chief Complaint  Patient presents with  . Respiratory Distress   patient is 65 year old female with past medical history significant for history of AAA rupture with repair in November 2016 who presents to the hospital with complaints of severe shortness of breath. In emergency room, she was noted to have CHF, pulmonary edema and underwent ultrafiltration of 2 L, she was weaned off to room air and now. She was noted to have right lower extremity swelling and underwent ultrasound of right lower extremity revealing cystic lesion in the groin. CT of abdomen and pelvis was performed showing fluid collection in the inguinal region associated with fem-femoral bypass. Vascular surgeon, saw patient in consultation and is contemplating possible drainage of fluid collection. Patient is afebrile , no white blood cell count elevation, not on antibiotics.   Review of Systems  Constitutional: Negative for fever, chills and weight loss.  HENT: Negative for congestion.   Eyes: Negative for blurred vision and double vision.  Respiratory: Positive for shortness of breath. Negative for cough, sputum production and wheezing.   Cardiovascular: Positive for leg swelling. Negative for chest pain, palpitations, orthopnea and PND.  Gastrointestinal: Negative for nausea, vomiting, abdominal pain, diarrhea, constipation and blood in stool.  Genitourinary: Negative for dysuria, urgency, frequency and hematuria.  Musculoskeletal: Negative for falls.  Neurological: Negative for dizziness, tremors, focal weakness and headaches.  Endo/Heme/Allergies: Does not bruise/bleed easily.  Psychiatric/Behavioral: Negative for depression. The patient does not have insomnia.     VITAL SIGNS: Blood pressure 165/68, pulse 70, temperature 98.7 F (37.1 C),  temperature source Oral, resp. rate 14, height 5\' 4"  (1.626 m), weight 60 kg (132 lb 4.4 oz), last menstrual period 03/30/1988, SpO2 94 %.  PHYSICAL EXAMINATION:   GENERAL:  65 y.o.-year-old patient lying in the bed with no acute distress.  EYES: Pupils equal, round, reactive to light and accommodation. No scleral icterus. Extraocular muscles intact.  HEENT: Head atraumatic, normocephalic. Oropharynx and nasopharynx clear.  NECK:  Supple, no jugular venous distention. No thyroid enlargement, no tenderness.  LUNGS: Normal breath sounds bilaterally, no wheezing, few rales, rhonchi , no crepitations. Intermittently using accessory muscles of respiration, especially when talking,  tachypneic.  CARDIOVASCULAR: S1, S2 normal. No murmurs, rubs, or gallops.  ABDOMEN: Soft, nontender, uncomfortable on palpation in the inguinal region bilaterally , but no rebound or guarding and not able to appreciate any fluid collections , nondistended. Bowel sounds present. No organomegaly or mass.  EXTREMITIES: No pedal edema, cyanosis, or clubbing.  NEUROLOGIC: Cranial nerves II through XII are intact. Muscle strength 5/5 in all extremities. Sensation intact. Gait not checked.  PSYCHIATRIC: The patient is alert and oriented x 3.  SKIN: No obvious rash, lesion, or ulcer.   ORDERS/RESULTS REVIEWED:   CBC  Recent Labs Lab 04/09/15 0532 04/09/15 1013 04/10/15 0817  WBC 11.9* 7.5 5.6  HGB 13.0 10.6* 10.2*  HCT 38.3 31.5* 30.7*  PLT 420 264 134*  MCV 99.7 99.0 98.0  MCH 33.8 33.3 32.4  MCHC 33.9 33.7 33.1  RDW 18.8* 18.4* 18.3*   ------------------------------------------------------------------------------------------------------------------  Chemistries   Recent Labs Lab 04/09/15 0532 04/09/15 1013 04/10/15 0817  NA 140  --  138  K 3.5  --  3.7  CL 103  --  101  CO2 25  --  28  GLUCOSE  127*  --  74  BUN 39*  --  27*  CREATININE 5.09* 5.20* 3.82*  CALCIUM 8.7*  --  8.3*  AST 28  --   --    ALT 11*  --   --   ALKPHOS 123  --   --   BILITOT 1.3*  --   --    ------------------------------------------------------------------------------------------------------------------ estimated creatinine clearance is 12.5 mL/min (by C-G formula based on Cr of 3.82). ------------------------------------------------------------------------------------------------------------------ No results for input(s): TSH, T4TOTAL, T3FREE, THYROIDAB in the last 72 hours.  Invalid input(s): FREET3  Cardiac Enzymes  Recent Labs Lab 04/09/15 1013 04/09/15 1644 04/09/15 2249  TROPONINI 0.11* 0.13* 0.08*   ------------------------------------------------------------------------------------------------------------------ Invalid input(s): POCBNP ---------------------------------------------------------------------------------------------------------------  RADIOLOGY: Ct Abdomen Pelvis W Contrast  04/10/2015  CLINICAL DATA:  65 year old female with a history of ruptured abdominal aortic aneurysm on CT imaging 02/15/2015. Electronic record reports that repair was performed at [facility] at this time. Current study is scheduled was follow-up. Cardiovascular risk factors include hypertension, hyperlipidemia, known vascular disease. EXAM: CT ABDOMEN AND PELVIS WITH CONTRAST TECHNIQUE: Multidetector CT imaging of the abdomen and pelvis was performed using the standard protocol following bolus administration of intravenous contrast. CONTRAST:  3mL OMNIPAQUE IOHEXOL 300 MG/ML  SOLN COMPARISON:  CT 02/15/2015, 07/09/2014 FINDINGS: Nonvascular: Lower chest: Moderate-sized bilateral pleural effusions with associated atelectasis. Respiratory motion. Heart size unremarkable with no pericardial fluid/ thickening. Small hiatal hernia. Abdomen/pelvis: Benign-appearing hypodense lesion of the liver, unchanged over time. Borderline enlarged spleen, unchanged in diameter from the comparison. Cranial caudal  measures 12 cm. Unremarkable bilateral adrenal glands. Cholelithiasis without acute inflammatory changes. Surgical changes of prior small bowel resection within the right abdomen. Appendix is not visualized, however, no inflammatory changes are present adjacent to the cecum to indicate an appendicitis. Unremarkable appearance the pancreas. No transition point.  No abnormally distended small bowel or colon. Right kidney: Coronal reformatted images demonstrate a cranial caudal dimension of 8.1 cm. The greatest cranial caudal dimension 04/25/2014 measures 11.3 cm. No hydronephrosis. Thinning of the cortex. Evidence of infection/ infarction of the lateral cortex right kidney. Left kidney: The greatest cranial caudal dimension on the current study measures 7.8 cm and on the prior 10.4 cm. Thinning of the cortex. No hydronephrosis. Urinary bladder partially distended with urine. Infiltration of the body wall and musculature, representing edema/anasarca. Low-density fluid collection associated with the right inguinal region measuring 3.9 cm x 3.3 cm. Low-density fluid collection associated with the left inguinal region measuring 2.1 cm by 2.8 cm. Rim enhancing fluid collection of the right-sided retroperitoneal region, associated with the right kidney in the region of the prior hematoma. Greatest AP diameter measures 5.7 cm. Greatest transverse dimension measures 2.8 cm. Greatest cranial caudal dimension measures approximately 8 cm. This collection is interposed between the right kidney in the psoas, and extends inferiorly along the iliac vasculature. Vascular: Status post repair of acute abdominal aortic aneurysm rupture. There has been hybrid repair, with left iliac aorto-uni endovascular aneurysm coverage and occlusion of the right iliac system, with left to right femoral femoral bypass grafting. Tandem Amplatzer occlusion of the right iliac system. Aneurysm has been repaired with what appears to be a [employer] device.  The supra renal fixation of the graft is difficult to visualize, and there appears to have been placement of a Cook Z stent at the proximal margin with the proximal aspect terminating at the level of the celiac artery. Superior mesenteric artery is patent at the level of the graft, although  the covered portion of the graft is difficult to assess. It is uncertain if the SMA is filling from collateral vasculature or if the origin is uncovered. Origin of the bilateral renal arteries not well visualized given the timing of the contrast bolus and exclusion of thin section imaging. Distal renal arteries do have contrast. Inferior mesenteric artery opacifies via collateral flow at the pelvis. Significant decreased size of the aneurysm sac, with no evidence of endoleak on the current CT. Greatest diameter measures 4.6 cm Left iliac artery remains patent. Left hypogastric artery is occluded. Left to right femoral-femoral crossover graft remains patent. Visualized proximal bilateral superficial femoral artery remain patent. Degenerative changes of the spine. Grade 1 anterolisthesis of L5 on S1. Vacuum disc phenomenon at L4-L5. Bilateral L5 pars defect. No acute fracture identified. Mild bilateral hip osteoarthritis. IMPRESSION: Vascular: Patient is status post hybrid repair of ruptured abdominal aortic aneurysm with left-sided endovascular aorto-uni coverage, left to right femoral femoral crossover graft, and Amplatzer occlusion of the right iliac system. Aneurysm sac appears thrombosed, with no evidence of type 1 or type 2 endoleak. The extension of suprarenal fixation is difficult to assess given the timing of the contrast bolus and absence of thin section imaging. There appears to be bare metal Z stent at the seal zone of the proximal endograft, with extension of the coverage to just below the celiac artery origin. The superior mesenteric artery remains patent at the origin, although it is uncertain if this is from an  uncovered origin or collateral flow. Compared to the prior CT there is decreasing size of the bilateral kidneys, compatible with compromise of perfusion. The origin of the bilateral renal arteries is difficult to assess given the timing of the contrast bolus and absence of thin section imaging. Would recommend conventional CT angiogram for future follow-up imaging, if a more specific assessment of the mesenteric vasculature is warranted. Nonvascular: Rim enhancing fluid collection interposed between the right kidney and right psoas muscle, in the region of previous hematoma. The volume is significantly decreased from the comparison CT of 02/15/2015, and while the finding is favored to represent liquefying hematoma, sterility cannot be assessed with imaging. Low-density fluid collection of the bilateral inguinal region associated with the bilateral anastamosis of the femoral-femoral crossover bypass. Differential diagnosis includes seroma, liquefying hematoma, or infection, as sterility is not assessed by imaging. Mild body wall edema/anasarca, with bilateral moderate sized pleural effusions. Signed, Dulcy Fanny. Earleen Newport, DO [facility] Radiology Electronically Signed   By: Corrie Mckusick D.O.   On: 04/10/2015 11:23   Dg Esophagus  04/10/2015  CLINICAL DATA:  Dysphagia EXAM: ESOPHOGRAM/BARIUM SWALLOW TECHNIQUE: Single contrast examination was performed using  thin barium FLUOROSCOPY TIME:  Radiation Exposure Index (as provided by the fluoroscopic device): 10 mGy COMPARISON:  None. FINDINGS: Limited exam due the patient's condition . Thoracic esophagus is widely patent. No mass lesion or obstructing lesion. No reflux . IMPRESSION: Normal exam. Electronically Signed   By: Marcello Moores  Register   On: 04/10/2015 10:47   US Venous Img Lower Unilateral Right  04/09/2015  CLINICAL DATA:  Right lower extremity swelling. History of aortic rupture and repair in November 2016. EXAM:  RIGHT LOWER EXTREMITY VENOUS DOPPLER ULTRASOUND TECHNIQUE: Gray-scale sonography with graded compression, as well as color Doppler and duplex ultrasound, were performed to evaluate the deep venous system from the level of the common femoral vein through the popliteal and proximal calf veins. Spectral Doppler was utilized to evaluate flow at rest  and with distal augmentation maneuvers. COMPARISON:  None. FINDINGS: Left common femoral vein is patent without thrombus. Large hypoechoic fluid collection in the right groin that measures 5.8 x 5.7 x 2.7 cm. Large fluid collection is mildly complex. No definite blood flow within this large fluid collection. Normal compressibility, augmentation and color Doppler flow in the right common femoral vein, right femoral vein and right popliteal vein. The right saphenofemoral junction is patent. Visualized right deep calf veins are patent without thrombus. Mild subcutaneous edema in the right calf. Right profunda femoral vein is patent without thrombus. IMPRESSION: Negative for deep venous thrombosis in the right lower extremity. Large fluid collection in the right groin. No blood flow within this large fluid collection. Findings may be represent a postoperative fluid collection or postoperative hematoma. Electronically Signed   By: Markus Daft M.D.   On: 04/09/2015 12:43   Dg Chest Port 1 View  04/10/2015  CLINICAL DATA:  65 year old female with shortness of breath and COPD. EXAM: PORTABLE CHEST 1 VIEW COMPARISON:  Radiograph dated 04/09/2015 FINDINGS: Right medial dialysis catheter in stable position. Single-view of the chest demonstrates emphysematous changes of the lungs with bibasilar atelectatic changes. There is prominence of the right hilar vasculature. Trace pleural effusion may be present. Findings may represent a degree of fluid overload. Pneumonia is not excluded. Clinical correlation is recommended. The cardiac silhouette is within normal limits. The osseous  structures appear unremarkable. IMPRESSION: Emphysema with probable degree of congestion or fluid overload. Pneumonia is not excluded. Electronically Signed   By: Anner Crete M.D.   On: 04/10/2015 06:40   Dg Chest Portable 1 View  04/09/2015  CLINICAL DATA:  65 year old female with shortness of breath EXAM: PORTABLE CHEST 1 VIEW COMPARISON:  Chest CT dated 07/19/2014 FINDINGS: Single-view of the chest demonstrates emphysematous changes of the lungs with bibasilar atelectasis/ scarring. There is diffuse interstitial prominence predominantly involving the lower lung fields compatible with a degree of fluid overload. No significant pleural effusion. No pneumothorax. The cardiac silhouette is within normal limits. A dialysis catheter noted with tip at the cavoatrial junction. The osseous structures appear grossly unremarkable. IMPRESSION: Emphysema with increased interstitial prominence likely a degree of fluid overload. Superimposed pneumonia is not excluded. Clinical correlation is recommended. Electronically Signed   By: Anner Crete M.D.   On: 04/09/2015 05:55    EKG:  Orders placed or performed during the hospital encounter of 04/09/15  . EKG 12-Lead  . EKG 12-Lead  . EKG 12-Lead  . EKG 12-Lead    ASSESSMENT AND PLAN:  Principal Problem:   Acute pulmonary edema with congestive heart failure (HCC) Active Problems:   Malignant essential hypertension   Dysphagia   Elevated troponin   Leukocytosis   Acute pulmonary edema ([place]) 1. Acute pulmonary edema due to acute diastolic CHF, status post hemodialysis 10th of January 2017, next hemodialysis tomorrow, possible discharge back to skilled nursing facility after hemodialysis tomorrow if no intervention is planned by vascular surgery 2. Malignant essential hypertension, improved on blood pressure medications as well as hemodialysis, follow closely. Advanced medications as needed 3. Dysphagia. Patient underwent barium swallow study which  was unremarkable, getting speech therapist involvement since patient still complains of need to clear throat intermittently 4. Elevated troponin, likely demand ischemia per cardiology, continue current therapy 5. Leukocytosis, likely stress related, not on antibiotic therapy, patient is afebrile and is not on antibiotics and he'll white blood cell count normalized, may need to undergo inguinal region fluid collection drainage by vascular  surgeon to rule out infection 6. Thrombocytopenia, follow in the morning, or heparin injections at present due to concerns of hematoma in the groin 7. Anemia, stable, although there was a 3 g drop since admission on January 9, vascular surgery is involved to rule out hematoma/leakage postoperatively   Management plans discussed with the patient, family and they are in agreement.   DRUG ALLERGIES: No Known Allergies  CODE STATUS:     Code Status Orders        Start     Ordered   04/09/15 1024  Full code   Continuous     04/09/15 1024    Code Status History    Date Active Date Inactive Code Status Order ID Comments User Context   This patient has a current code status but no historical code status.      TOTAL TIME TAKING CARE OF THIS PATIENT: 45 minutes.    Theodoro Grist M.D on 04/10/2015 at 1:53 PM  Between 7am to 6pm - Pager - [phone]  After 6pm go to [email or web address] - password [email or web address] [facility]  Office  [phone]  CC: Primary care physician; Arnette Norris, MD

## 2015-04-10 NOTE — NC FL2 (Signed)
[place] LEVEL OF CARE SCREENING TOOL     IDENTIFICATION  Patient Name: [name] Birthdate: 16-Nov-1950 Sex: female Admission Date (Current Location): 04/09/2015  [place] and [place] Number:  Engineering geologist and Address:  [facility], [address] [place], [place] [address]      Provider Number: [id number]  Attending Physician Name and Address:  Theodoro Grist, MD  Relative Name and Phone Number:       Current Level of Care: Hospital Recommended Level of Care: [place] Prior Approval Number:    Date Approved/Denied:   PASRR Number:  ([id number] )  Discharge Plan: SNF    Current Diagnoses: Patient Active Problem List   Diagnosis Date Noted  . Acute pulmonary edema with congestive heart failure ([place]) 04/09/2015  . Leukocytosis 04/09/2015  . Malignant essential hypertension 04/09/2015  . Dysphagia 04/09/2015  . Elevated troponin 04/09/2015  . Acute pulmonary edema ([place]) 04/09/2015  . COPD exacerbation ([place]) 01/03/2015  . Alopecia 09/24/2014  . VAIN II (vaginal intraepithelial neoplasia grade II) 11/22/2013    Class: History of  . Postmenopausal HRT (hormone replacement therapy) 09/18/2013  . Pernicious anemia 09/18/2013  . Elevated liver function tests 09/14/2011  . Hypothyroidism 05/30/2010  . HLD (hyperlipidemia) 05/30/2010  . TOBACCO ABUSE 05/30/2010  . Essential hypertension 05/30/2010  . Abdominal aortic aneurysm ([place]) 05/30/2010  . COPD 05/30/2010  . GERD 05/30/2010    Orientation RESPIRATION BLADDER Height & Weight    Self, Time, Situation, Place  Normal Continent 5\' 4"  (162.6 cm) 132 lbs.  BEHAVIORAL SYMPTOMS/MOOD NEUROLOGICAL BOWEL NUTRITION STATUS   (none )  (none ) Continent Diet (Diet: Renal Diet )  AMBULATORY STATUS COMMUNICATION OF NEEDS Skin   Extensive Assist Verbally Surgical wounds                       Personal Care Assistance Level of Assistance  Bathing,  Feeding, Dressing Bathing Assistance: Limited assistance Feeding assistance: Independent Dressing Assistance: Limited assistance     Functional Limitations Info  Sight, Hearing, Speech Sight Info: Adequate Hearing Info: Adequate Speech Info: Adequate    SPECIAL CARE FACTORS FREQUENCY  PT (By licensed PT) (Dialysis ([address] )     PT Frequency:  (5)              Contractures      Additional Factors Info  Code Status Code Status Info:  (Full Code. )             Current Medications (04/10/2015):  This is the current hospital active medication list Current Facility-Administered Medications  Medication Dose Route Frequency Provider Last Rate Last Dose  . 0.9 %  sodium chloride infusion  250 mL Intravenous PRN Theodoro Grist, MD      . acetaminophen (TYLENOL) tablet 1,000 mg  1,000 mg Oral 3 times per day Theodoro Grist, MD   1,000 mg at 04/10/15 0515  . acetaminophen (TYLENOL) tablet 650 mg  650 mg Oral Q6H PRN Theodoro Grist, MD   650 mg at 04/09/15 2010  . antiseptic oral rinse (CPC / CETYLPYRIDINIUM CHLORIDE 0.05%) solution 7 mL  7 mL Mouth Rinse q12n4p Theodoro Grist, MD   7 mL at 04/09/15 1701  . aspirin EC tablet 81 mg  81 mg Oral Daily Theodoro Grist, MD   81 mg at 04/09/15 1357  . atorvastatin (LIPITOR) tablet 40 mg  40 mg Oral Daily Theodoro Grist, MD   40 mg  at 04/09/15 1357  . budesonide-formoterol (SYMBICORT) 80-4.5 MCG/ACT inhaler 2 puff  2 puff Inhalation BID Theodoro Grist, MD   2 puff at 04/10/15 1057  . chlorhexidine (PERIDEX) 0.12 % solution 15 mL  15 mL Mouth Rinse BID Theodoro Grist, MD   15 mL at 04/10/15 1057  . docusate sodium (COLACE) capsule 100 mg  100 mg Oral BID Theodoro Grist, MD   100 mg at 04/09/15 2145  . [START ON 04/11/2015] epoetin alfa (EPOGEN,PROCRIT) injection 4,000 Units  4,000 Units Intravenous Q T,Th,Sa-HD Harmeet Singh, MD      . famotidine (PEPCID) tablet 20 mg  20 mg Oral Daily Theodoro Grist, MD   20 mg at 04/09/15 1359  .  fluticasone (FLONASE) 50 MCG/ACT nasal spray 2 spray  2 spray Each Nare Daily Theodoro Grist, MD   2 spray at 04/10/15 1057  . hydrALAZINE (APRESOLINE) tablet 10 mg  10 mg Oral Q6H Theodoro Grist, MD   10 mg at 04/10/15 0515  . ipratropium-albuterol (DUONEB) 0.5-2.5 (3) MG/3ML nebulizer solution 3 mL  3 mL Nebulization Q6H Theodoro Grist, MD   3 mL at 04/10/15 1405  . levothyroxine (SYNTHROID, LEVOTHROID) tablet 88 mcg  88 mcg Oral Daily Theodoro Grist, MD   88 mcg at 04/10/15 0515  . lidocaine (LIDODERM) 5 % 1 patch  1 patch Transdermal Q24H Theodoro Grist, MD   1 patch at 04/09/15 1412  . magnesium hydroxide (MILK OF MAGNESIA) suspension 30 mL  30 mL Oral Daily PRN Theodoro Grist, MD      . metoprolol (LOPRESSOR) tablet 50 mg  50 mg Oral BID Theodoro Grist, MD   50 mg at 04/09/15 2145  . multivitamin (RENA-VIT) tablet 1 tablet  1 tablet Oral Daily Theodoro Grist, MD   1 tablet at 04/09/15 1404  . ondansetron (ZOFRAN) tablet 4 mg  4 mg Oral Q6H PRN Theodoro Grist, MD       Or  . ondansetron (ZOFRAN) injection 4 mg  4 mg Intravenous Q6H PRN Theodoro Grist, MD      . ondansetron (ZOFRAN) tablet 4 mg  4 mg Oral Q6H PRN Theodoro Grist, MD      . sodium chloride (OCEAN) 0.65 % nasal spray 1 spray  1 spray Each Nare 5 X Daily Theodoro Grist, MD   1 spray at 04/10/15 1057  . sodium chloride 0.9 % injection 3 mL  3 mL Intravenous Q12H Theodoro Grist, MD   3 mL at 04/10/15 1030  . sodium chloride 0.9 % injection 3 mL  3 mL Intravenous Q12H Theodoro Grist, MD   3 mL at 04/10/15 1030  . sodium chloride 0.9 % injection 3 mL  3 mL Intravenous PRN Theodoro Grist, MD         Discharge Medications: Please see discharge summary for a list of discharge medications.  Relevant Imaging Results:  Relevant Lab Results:   Additional Information  Multimedia programmer: [id number] (Dialysis [address]) )  Loralyn Fresh[place], [place]

## 2015-04-11 LAB — PLATELET COUNT: Platelets: 154 10*3/uL (ref 150–440)

## 2015-04-11 MED ORDER — IRBESARTAN 75 MG PO TABS
75.0000 mg | ORAL_TABLET | Freq: Every day | ORAL | Status: DC
  Administered 2015-04-11 – 2015-04-12 (×2): 75 mg via ORAL
  Filled 2015-04-11 (×2): qty 1

## 2015-04-11 NOTE — Progress Notes (Signed)
Subjective:  Patient feels better today Room air at present Scheduled for dialysis later today   Objective:  Vital signs in last 24 hours:  Temp:  [98.4 F (36.9 C)-98.8 F (37.1 C)] 98.4 F (36.9 C) (01/12 0439) Pulse Rate:  [86-106] 89 (01/12 0951) Resp:  [15-31] 20 (01/12 0439) BP: (161-179)/(59-86) 169/76 mmHg (01/12 0951) SpO2:  [87 %-96 %] 93 % (01/12 0803)  Weight change:  Filed Weights   04/09/15 0523 04/09/15 0940 04/09/15 1334  Weight: 68.992 kg (152 lb 1.6 oz) 61.5 kg (135 lb 9.3 oz) 60 kg (132 lb 4.4 oz)    Intake/Output:    Intake/Output Summary (Last 24 hours) at 04/11/15 1225 Last data filed at 04/10/15 2221  Gross per 24 hour  Intake      3 ml  Output      0 ml  Net      3 ml     Physical Exam: General: NAD  HEENT Anicteric, moist oral mucus membranes  Neck supple  Pulm/lungs Scattered rhonchi otherwise clear, normal resp effort on room air  CVS/Heart Irregular, no rub  Abdomen:  Soft, NT, scars from previous surgeries  Extremities: No peripheral edema  Neurologic: Alert, follows commands, wheelchair dependent at home  Skin: No acute rashes  Access: Rt IJ PC       Basic Metabolic Panel:   Recent Labs Lab 04/09/15 0532 04/09/15 1013 04/10/15 0817  NA 140  --  138  K 3.5  --  3.7  CL 103  --  101  CO2 25  --  28  GLUCOSE 127*  --  74  BUN 39*  --  27*  CREATININE 5.09* 5.20* 3.82*  CALCIUM 8.7*  --  8.3*     CBC:  Recent Labs Lab 04/09/15 0532 04/09/15 1013 04/10/15 0817 04/11/15 0438  WBC 11.9* 7.5 5.6  --   HGB 13.0 10.6* 10.2*  --   HCT 38.3 31.5* 30.7*  --   MCV 99.7 99.0 98.0  --   PLT 420 264 134* 154      Microbiology:  Recent Results (from the past 720 hour(s))  MRSA PCR Screening     Status: None   Collection Time: 04/09/15  9:45 AM  Result Value Ref Range Status   MRSA by PCR NEGATIVE NEGATIVE Final    Comment:        The GeneXpert MRSA Assay (FDA approved for NASAL specimens only), is one  component of a comprehensive MRSA colonization surveillance program. It is not intended to diagnose MRSA infection nor to guide or monitor treatment for MRSA infections.     Coagulation Studies: No results for input(s): LABPROT, INR in the last 72 hours.  Urinalysis: No results for input(s): COLORURINE, LABSPEC, PHURINE, GLUCOSEU, HGBUR, BILIRUBINUR, KETONESUR, PROTEINUR, UROBILINOGEN, NITRITE, LEUKOCYTESUR in the last 72 hours.  Invalid input(s): APPERANCEUR    Imaging: Ct Abdomen Pelvis W Contrast  04/10/2015  CLINICAL DATA:  65 year old female with a history of ruptured abdominal aortic aneurysm on CT imaging 02/15/2015. Electronic record reports that repair was performed at [place] at this time. Current study is scheduled was follow-up. Cardiovascular risk factors include hypertension, hyperlipidemia, known vascular disease. EXAM: CT ABDOMEN AND PELVIS WITH CONTRAST TECHNIQUE: Multidetector CT imaging of the abdomen and pelvis was performed using the standard protocol following bolus administration of intravenous contrast. CONTRAST:  38mL OMNIPAQUE IOHEXOL 300 MG/ML  SOLN COMPARISON:  CT 02/15/2015, 07/09/2014 FINDINGS: Nonvascular: Lower chest: Moderate-sized bilateral pleural effusions  with associated atelectasis. Respiratory motion. Heart size unremarkable with no pericardial fluid/ thickening. Small hiatal hernia. Abdomen/pelvis: Benign-appearing hypodense lesion of the liver, unchanged over time. Borderline enlarged spleen, unchanged in diameter from the comparison. Cranial caudal measures 12 cm. Unremarkable bilateral adrenal glands. Cholelithiasis without acute inflammatory changes. Surgical changes of prior small bowel resection within the right abdomen. Appendix is not visualized, however, no inflammatory changes are present adjacent to the cecum to indicate an appendicitis. Unremarkable appearance the pancreas. No transition point.  No abnormally distended  small bowel or colon. Right kidney: Coronal reformatted images demonstrate a cranial caudal dimension of 8.1 cm. The greatest cranial caudal dimension 04/25/2014 measures 11.3 cm. No hydronephrosis. Thinning of the cortex. Evidence of infection/ infarction of the lateral cortex right kidney. Left kidney: The greatest cranial caudal dimension on the current study measures 7.8 cm and on the prior 10.4 cm. Thinning of the cortex. No hydronephrosis. Urinary bladder partially distended with urine. Infiltration of the body wall and musculature, representing edema/anasarca. Low-density fluid collection associated with the right inguinal region measuring 3.9 cm x 3.3 cm. Low-density fluid collection associated with the left inguinal region measuring 2.1 cm by 2.8 cm. Rim enhancing fluid collection of the right-sided retroperitoneal region, associated with the right kidney in the region of the prior hematoma. Greatest AP diameter measures 5.7 cm. Greatest transverse dimension measures 2.8 cm. Greatest cranial caudal dimension measures approximately 8 cm. This collection is interposed between the right kidney in the psoas, and extends inferiorly along the iliac vasculature. Vascular: Status post repair of acute abdominal aortic aneurysm rupture. There has been hybrid repair, with left iliac aorto-uni endovascular aneurysm coverage and occlusion of the right iliac system, with left to right femoral femoral bypass grafting. Tandem Amplatzer occlusion of the right iliac system. Aneurysm has been repaired with what appears to be a [employer] device. The supra renal fixation of the graft is difficult to visualize, and there appears to have been placement of a Cook Z stent at the proximal margin with the proximal aspect terminating at the level of the celiac artery. Superior mesenteric artery is patent at the level of the graft, although the covered portion of the graft is difficult to assess. It is uncertain if the SMA is filling  from collateral vasculature or if the origin is uncovered. Origin of the bilateral renal arteries not well visualized given the timing of the contrast bolus and exclusion of thin section imaging. Distal renal arteries do have contrast. Inferior mesenteric artery opacifies via collateral flow at the pelvis. Significant decreased size of the aneurysm sac, with no evidence of endoleak on the current CT. Greatest diameter measures 4.6 cm Left iliac artery remains patent. Left hypogastric artery is occluded. Left to right femoral-femoral crossover graft remains patent. Visualized proximal bilateral superficial femoral artery remain patent. Degenerative changes of the spine. Grade 1 anterolisthesis of L5 on S1. Vacuum disc phenomenon at L4-L5. Bilateral L5 pars defect. No acute fracture identified. Mild bilateral hip osteoarthritis. IMPRESSION: Vascular: Patient is status post hybrid repair of ruptured abdominal aortic aneurysm with left-sided endovascular aorto-uni coverage, left to right femoral femoral crossover graft, and Amplatzer occlusion of the right iliac system. Aneurysm sac appears thrombosed, with no evidence of type 1 or type 2 endoleak. The extension of suprarenal fixation is difficult to assess given the timing of the contrast bolus and absence of thin section imaging. There appears to be bare metal Z stent at the seal zone of the proximal endograft, with extension  of the coverage to just below the celiac artery origin. The superior mesenteric artery remains patent at the origin, although it is uncertain if this is from an uncovered origin or collateral flow. Compared to the prior CT there is decreasing size of the bilateral kidneys, compatible with compromise of perfusion. The origin of the bilateral renal arteries is difficult to assess given the timing of the contrast bolus and absence of thin section imaging. Would recommend conventional CT angiogram for future follow-up imaging, if a more specific  assessment of the mesenteric vasculature is warranted. Nonvascular: Rim enhancing fluid collection interposed between the right kidney and right psoas muscle, in the region of previous hematoma. The volume is significantly decreased from the comparison CT of 02/15/2015, and while the finding is favored to represent liquefying hematoma, sterility cannot be assessed with imaging. Low-density fluid collection of the bilateral inguinal region associated with the bilateral anastamosis of the femoral-femoral crossover bypass. Differential diagnosis includes seroma, liquefying hematoma, or infection, as sterility is not assessed by imaging. Mild body wall edema/anasarca, with bilateral moderate sized pleural effusions. Signed, Dulcy Fanny. Earleen Newport, DO [facility] Electronically Signed   By: Corrie Mckusick D.O.   On: 04/10/2015 11:23   Dg Esophagus  04/10/2015  CLINICAL DATA:  Dysphagia EXAM: ESOPHOGRAM/BARIUM SWALLOW TECHNIQUE: Single contrast examination was performed using  thin barium FLUOROSCOPY TIME:  Radiation Exposure Index (as provided by the fluoroscopic device): 10 mGy COMPARISON:  None. FINDINGS: Limited exam due the patient's condition . Thoracic esophagus is widely patent. No mass lesion or obstructing lesion. No reflux . IMPRESSION: Normal exam. Electronically Signed   By: Marcello Moores  Register   On: 04/10/2015 10:47   US Venous Img Lower Unilateral Right  04/09/2015  CLINICAL DATA:  Right lower extremity swelling. History of aortic rupture and repair in November 2016. EXAM: RIGHT LOWER EXTREMITY VENOUS DOPPLER ULTRASOUND TECHNIQUE: Gray-scale sonography with graded compression, as well as color Doppler and duplex ultrasound, were performed to evaluate the deep venous system from the level of the common femoral vein through the popliteal and proximal calf veins. Spectral Doppler was utilized to evaluate flow at rest and with distal augmentation maneuvers.  COMPARISON:  None. FINDINGS: Left common femoral vein is patent without thrombus. Large hypoechoic fluid collection in the right groin that measures 5.8 x 5.7 x 2.7 cm. Large fluid collection is mildly complex. No definite blood flow within this large fluid collection. Normal compressibility, augmentation and color Doppler flow in the right common femoral vein, right femoral vein and right popliteal vein. The right saphenofemoral junction is patent. Visualized right deep calf veins are patent without thrombus. Mild subcutaneous edema in the right calf. Right profunda femoral vein is patent without thrombus. IMPRESSION: Negative for deep venous thrombosis in the right lower extremity. Large fluid collection in the right groin. No blood flow within this large fluid collection. Findings may be represent a postoperative fluid collection or postoperative hematoma. Electronically Signed   By: Markus Daft M.D.   On: 04/09/2015 12:43   Dg Chest Port 1 View  04/10/2015  CLINICAL DATA:  65 year old female with shortness of breath and COPD. EXAM: PORTABLE CHEST 1 VIEW COMPARISON:  Radiograph dated 04/09/2015 FINDINGS: Right medial dialysis catheter in stable position. Single-view of the chest demonstrates emphysematous changes of the lungs with bibasilar atelectatic changes. There is prominence of the right hilar vasculature. Trace pleural effusion may be present. Findings may represent a degree of fluid overload. Pneumonia is not excluded.  Clinical correlation is recommended. The cardiac silhouette is within normal limits. The osseous structures appear unremarkable. IMPRESSION: Emphysema with probable degree of congestion or fluid overload. Pneumonia is not excluded. Electronically Signed   By: Anner Crete M.D.   On: 04/10/2015 06:40     Medications:     . acetaminophen  1,000 mg Oral 3 times per day  . antiseptic oral rinse  7 mL Mouth Rinse q12n4p  . aspirin EC  81 mg Oral Daily  . atorvastatin  40 mg Oral  Daily  . budesonide-formoterol  2 puff Inhalation BID  . chlorhexidine  15 mL Mouth Rinse BID  . docusate sodium  100 mg Oral BID  . epoetin (EPOGEN/PROCRIT) injection  4,000 Units Intravenous Q T,Th,Sa-HD  . famotidine  20 mg Oral Daily  . fluticasone  2 spray Each Nare Daily  . hydrALAZINE  10 mg Oral Q6H  . ipratropium-albuterol  3 mL Nebulization Q6H  . levothyroxine  88 mcg Oral Daily  . lidocaine  1 patch Transdermal Q24H  . metoprolol  50 mg Oral BID  . multivitamin  1 tablet Oral Daily  . sodium chloride  1 spray Each Nare 5 X Daily  . sodium chloride  3 mL Intravenous Q12H  . sodium chloride  3 mL Intravenous Q12H   sodium chloride, acetaminophen, magnesium hydroxide, ondansetron **OR** ondansetron (ZOFRAN) IV, ondansetron, sodium chloride  Assessment/ Plan:  65 y.o. female with a PMHX of medical problems of COPD, hypertension, hyperlipidemia, vaginal intraepithelial neoplasia, emergent AAA repair status post rupture on November 18, left to right femoral to femoral bypass , acute renal failure leading to permanent dialysis, significant 3 vessel disease, was admitted on 04/09/2015 with severe shortness of breath after missing dialysis due to weather conditions.   1. End-stage renal disease. [place]. TTS. [facility] nephrology Plan for scheduled HD today  2. Acute pulmonary edema, requiring NIPPV Emergent dialysis required for volume removal. 2000 cc removed 1/10 Clinically improved    3. Peripheral edema Fluid removal with dialysis as tolerated  4. Anemia of chronic kidney disease Hemoglobin 10.2. Low dose procrit with HD      LOS: 2 [name] 1/12/201712:25 PM

## 2015-04-11 NOTE — Evaluation (Signed)
Physical Therapy Evaluation Patient Details Name: [name] MRN: [id number] DOB: 1951-02-24 Today's Date: 04/11/2015   History of Present Illness  [name] is a 65 y.o. female with a known history of hypertension, hyperlipidemia, hypothyroidism, gastroesophageal reflux disease, abdominal aortic aneurysm ruptured and repaired, B12 deficiency, left ankle skin cancer, COPD, not on oxygen at home, a former smoker who presents to the hospital with complaints of shortness of breath. Patient was admitted with O2 sats at 70%; She comes from inpatient rehab at [facility] s/p AAA rupture.   Clinical Impression  65 yo Female was admitted to hospital with increased shortness of breath. Patient reports increased difficulty with ADLs and decline in functioning since November 2016. She reports going to hospital with ruptured AAA and was hospitalized for 5 weeks. Patient was brought to ED from SNF where she was getting STR. Patient is min A for bed mobility. She is able to transfer to bedside chair with mod A requiring assistance to block knees for initiate transfer. Patient is unable to ambulate at this time. She exhibits increased weakness in BLE (R>L). She would benefit from additional skilled PT intervention to improve LE strength, balance and gait safety. Patient's clinical presentation is stable. Personal factors affecting rehab include, high fall risk, co-morbidities including kidney failure (on dialysis) which contributes to weakness, and COPD.     Follow Up Recommendations SNF    Equipment Recommendations       Recommendations for Other Services       Precautions / Restrictions Precautions Precautions: Fall Restrictions Weight Bearing Restrictions: No      Mobility  Bed Mobility Overal bed mobility: Needs Assistance Bed Mobility: Supine to Sit     Supine to sit: Min assist     General bed mobility comments: Patient requires min A for moving LE off  bed;  Transfers Overall transfer level: Needs assistance Equipment used: 1 person hand held assist Transfers: Sit to/from Stand Sit to Stand: Mod assist         General transfer comment: Patient is mod A for sit<>stand and stand pivot transfer to bedside chair; She was able to stand fully erect but then had difficulty moving LEs for stand pivot;   Ambulation/Gait             General Gait Details: pt unable to ambulate at this time;   Stairs            Wheelchair Mobility    Modified Rankin (Stroke Patients Only)       Balance Overall balance assessment: Needs assistance Sitting-balance support: Feet supported Sitting balance-Leahy Scale: Good Sitting balance - Comments: patient able to demonstrate erect sitting posture with LEs supported;   Standing balance support: Bilateral upper extremity supported Standing balance-Leahy Scale: Fair Standing balance comment: patient requires min-mod A for standing balance                             Pertinent Vitals/Pain Pain Assessment: No/denies pain    Home Living Family/patient expects to be discharged to:: Skilled nursing facility                 Additional Comments: Patient was at home independent prior to Nov 2016; She lives in a single story home with 3 steps to enter with B rails; She has no AD; Patient has a shower/tub combo; She has help from her husband who is there 24/7    Prior Function Level  of Independence: Needs assistance   Gait / Transfers Assistance Needed: unable to ambulate; patient needed assistance with all transfers; able to stand up to 2 min with RW with assistance;  ADL's / Homemaking Assistance Needed: needed assistance with bathing/dressing        Hand Dominance   Dominant Hand: Right    Extremity/Trunk Assessment               Lower Extremity Assessment: RLE deficits/detail;LLE deficits/detail RLE Deficits / Details: hip 2/5, knee 2/5, ankle 1/5 LLE  Deficits / Details: hip 3-/5, knee 3/5, ankle 2/5  Cervical / Trunk Assessment: Normal  Communication   Communication: No difficulties  Cognition Arousal/Alertness: Awake/alert Behavior During Therapy: WFL for tasks assessed/performed Overall Cognitive Status: Within Functional Limits for tasks assessed                      General Comments General comments (skin integrity, edema, etc.): has escar scabbed wound on left lower leg secondary to surgical incision;     Exercises Other Exercises Other Exercises: Instructed patient in long sitting BLE exercise: ankle pumps x10 (AAROM on RLE); quad sets 5 sec hold x5; hip abduction/adduction to midline x10; Patient required min VCs to increase ROM with advanced exercise. She had increased difficulty initiating RLE requiring AAROM with ankle pumps;       Assessment/Plan    PT Assessment Patient needs continued PT services  PT Diagnosis Difficulty walking;Generalized weakness   PT Problem List Decreased strength;Decreased activity tolerance;Decreased balance;Decreased mobility;Impaired sensation;Cardiopulmonary status limiting activity;Decreased safety awareness  PT Treatment Interventions DME instruction;Balance training;Gait training;Neuromuscular re-education;Stair training;Functional mobility training;Therapeutic activities;Therapeutic exercise;Patient/family education   PT Goals (Current goals can be found in the Care Plan section) Acute Rehab PT Goals Patient Stated Goal: "I just want to be able to care for myself again" PT Goal Formulation: With patient Time For Goal Achievement: 04/25/15 Potential to Achieve Goals: Good    Frequency Min 2X/week   Barriers to discharge Inaccessible home environment has stairs to enter house;     Co-evaluation               End of Session Equipment Utilized During Treatment: Gait belt Activity Tolerance: Patient tolerated treatment well;Patient limited by fatigue Patient left: in  chair;with call bell/phone within reach;with chair alarm set;with family/visitor present Nurse Communication: Mobility status         Time: [id number] PT Time Calculation (min) (ACUTE ONLY): 29 min   Charges:   PT Evaluation $PT Eval Low Complexity: 1 Procedure PT Treatments $Therapeutic Exercise: 8-22 mins   PT G Codes:        Trotter,Margaret PT, DPT 04/11/2015, 10:18 AM

## 2015-04-11 NOTE — Progress Notes (Signed)
Pre-hd tx 

## 2015-04-11 NOTE — Progress Notes (Signed)
[facility] [place] NAME: [name]    MR#:  [id number]  DATE OF BIRTH:  Nov 13, 1950  SUBJECTIVE:  CHIEF COMPLAINT:   Chief Complaint  Patient presents with  . Respiratory Distress   patient is 65 year old female with past medical history significant for history of AAA rupture with repair in November 2016 who presents to the hospital with complaints of severe shortness of breath. In emergency room, she was noted to have CHF, pulmonary edema and underwent ultrafiltration of 2 L, she was weaned off to room air and now. She was noted to have right lower extremity swelling and underwent ultrasound of right lower extremity revealing cystic lesion in the groin. CT of abdomen and pelvis was performed showing fluid collection in the inguinal region associated with fem-femoral bypass. Vascular surgeon, saw patient in consultation and is concerned about the possible low level infection in from femorofemoral bypass area, Dr. Delana Meyer is going to discuss case with [facility] vascular surgery and depending on [facility] vascular surgery recommendations, patient may need to be transferred to [facility] for further evaluation and treatment.  Patient admits of shortness of breath worse than it was yesterday, requiring her to be placed on oxygen therapy again. For  Hemodialysis today.  Review of Systems  Constitutional: Negative for fever, chills and weight loss.  HENT: Negative for congestion.   Eyes: Negative for blurred vision and double vision.  Respiratory: Positive for shortness of breath. Negative for cough, sputum production and wheezing.   Cardiovascular: Positive for leg swelling. Negative for chest pain, palpitations, orthopnea and PND.  Gastrointestinal: Negative for nausea, vomiting, abdominal pain, diarrhea, constipation and blood in stool.  Genitourinary: Negative for dysuria, urgency, frequency and hematuria.  Musculoskeletal: Negative for falls.   Neurological: Negative for dizziness, tremors, focal weakness and headaches.  Endo/Heme/Allergies: Does not bruise/bleed easily.  Psychiatric/Behavioral: Negative for depression. The patient does not have insomnia.     VITAL SIGNS: Blood pressure 186/81, pulse 98, temperature 98.6 F (37 C), temperature source Oral, resp. rate 21, height 5\' 4"  (1.626 m), weight 57.2 kg (126 lb 1.7 oz), last menstrual period 03/30/1988, SpO2 97 %.  PHYSICAL EXAMINATION:   GENERAL:  65 y.o.-year-old patient lying in the bed in mild respiratory distress., Tachypneic  EYES: Pupils equal, round, reactive to light and accommodation. No scleral icterus. Extraocular muscles intact.  HEENT: Head atraumatic, normocephalic. Oropharynx and nasopharynx clear.  NECK:  Supple, no jugular venous distention. No thyroid enlargement, no tenderness.  LUNGS: Some diminished breath sounds bilaterally, no wheezing, basilar rales, rhonchi , and crepitations noted. Intermittently using accessory muscles of respiration, especially when talking,  tachypneic.  CARDIOVASCULAR: S1, S2 normal. No murmurs, rubs, or gallops.  ABDOMEN: Soft, nontender, uncomfortable on palpation in the inguinal region bilaterally , but no rebound or guarding and not able to appreciate any fluid collections , nondistended. Left groin area is dressed and draining. Bowel sounds present. No organomegaly or mass.  EXTREMITIES: No pedal edema, cyanosis, or clubbing.  NEUROLOGIC: Cranial nerves II through XII are intact. Muscle strength 5/5 in all extremities. Sensation intact. Gait not checked.  PSYCHIATRIC: The patient is alert and oriented x 3.  SKIN: No obvious rash, lesion, or ulcer.   ORDERS/RESULTS REVIEWED:   CBC  Recent Labs Lab 04/09/15 0532 04/09/15 1013 04/10/15 0817 04/11/15 0438  WBC 11.9* 7.5 5.6  --   HGB 13.0 10.6* 10.2*  --   HCT 38.3 31.5* 30.7*  --  PLT 420 264 134* 154  MCV 99.7 99.0 98.0  --   MCH 33.8 33.3 32.4  --   MCHC 33.9  33.7 33.1  --   RDW 18.8* 18.4* 18.3*  --    ------------------------------------------------------------------------------------------------------------------  Chemistries   Recent Labs Lab 04/09/15 0532 04/09/15 1013 04/10/15 0817  NA 140  --  138  K 3.5  --  3.7  CL 103  --  101  CO2 25  --  28  GLUCOSE 127*  --  74  BUN 39*  --  27*  CREATININE 5.09* 5.20* 3.82*  CALCIUM 8.7*  --  8.3*  AST 28  --   --   ALT 11*  --   --   ALKPHOS 123  --   --   BILITOT 1.3*  --   --    ------------------------------------------------------------------------------------------------------------------ estimated creatinine clearance is 12.5 mL/min (by C-G formula based on Cr of 3.82). ------------------------------------------------------------------------------------------------------------------ No results for input(s): TSH, T4TOTAL, T3FREE, THYROIDAB in the last 72 hours.  Invalid input(s): FREET3  Cardiac Enzymes  Recent Labs Lab 04/09/15 1013 04/09/15 1644 04/09/15 2249  TROPONINI 0.11* 0.13* 0.08*   ------------------------------------------------------------------------------------------------------------------ Invalid input(s): POCBNP ---------------------------------------------------------------------------------------------------------------  RADIOLOGY: Ct Abdomen Pelvis W Contrast  04/10/2015  CLINICAL DATA:  65 year old female with a history of ruptured abdominal aortic aneurysm on CT imaging 02/15/2015. Electronic record reports that repair was performed at [place] at this time. Current study is scheduled was follow-up. Cardiovascular risk factors include hypertension, hyperlipidemia, known vascular disease. EXAM: CT ABDOMEN AND PELVIS WITH CONTRAST TECHNIQUE: Multidetector CT imaging of the abdomen and pelvis was performed using the standard protocol following bolus administration of intravenous contrast. CONTRAST:  61mL OMNIPAQUE IOHEXOL 300 MG/ML   SOLN COMPARISON:  CT 02/15/2015, 07/09/2014 FINDINGS: Nonvascular: Lower chest: Moderate-sized bilateral pleural effusions with associated atelectasis. Respiratory motion. Heart size unremarkable with no pericardial fluid/ thickening. Small hiatal hernia. Abdomen/pelvis: Benign-appearing hypodense lesion of the liver, unchanged over time. Borderline enlarged spleen, unchanged in diameter from the comparison. Cranial caudal measures 12 cm. Unremarkable bilateral adrenal glands. Cholelithiasis without acute inflammatory changes. Surgical changes of prior small bowel resection within the right abdomen. Appendix is not visualized, however, no inflammatory changes are present adjacent to the cecum to indicate an appendicitis. Unremarkable appearance the pancreas. No transition point.  No abnormally distended small bowel or colon. Right kidney: Coronal reformatted images demonstrate a cranial caudal dimension of 8.1 cm. The greatest cranial caudal dimension 04/25/2014 measures 11.3 cm. No hydronephrosis. Thinning of the cortex. Evidence of infection/ infarction of the lateral cortex right kidney. Left kidney: The greatest cranial caudal dimension on the current study measures 7.8 cm and on the prior 10.4 cm. Thinning of the cortex. No hydronephrosis. Urinary bladder partially distended with urine. Infiltration of the body wall and musculature, representing edema/anasarca. Low-density fluid collection associated with the right inguinal region measuring 3.9 cm x 3.3 cm. Low-density fluid collection associated with the left inguinal region measuring 2.1 cm by 2.8 cm. Rim enhancing fluid collection of the right-sided retroperitoneal region, associated with the right kidney in the region of the prior hematoma. Greatest AP diameter measures 5.7 cm. Greatest transverse dimension measures 2.8 cm. Greatest cranial caudal dimension measures approximately 8 cm. This collection is interposed between the right kidney in the psoas, and  extends inferiorly along the iliac vasculature. Vascular: Status post repair of acute abdominal aortic aneurysm rupture. There has been hybrid repair, with left iliac aorto-uni endovascular aneurysm coverage and occlusion of the  right iliac system, with left to right femoral femoral bypass grafting. Tandem Amplatzer occlusion of the right iliac system. Aneurysm has been repaired with what appears to be a [employer] device. The supra renal fixation of the graft is difficult to visualize, and there appears to have been placement of a Cook Z stent at the proximal margin with the proximal aspect terminating at the level of the celiac artery. Superior mesenteric artery is patent at the level of the graft, although the covered portion of the graft is difficult to assess. It is uncertain if the SMA is filling from collateral vasculature or if the origin is uncovered. Origin of the bilateral renal arteries not well visualized given the timing of the contrast bolus and exclusion of thin section imaging. Distal renal arteries do have contrast. Inferior mesenteric artery opacifies via collateral flow at the pelvis. Significant decreased size of the aneurysm sac, with no evidence of endoleak on the current CT. Greatest diameter measures 4.6 cm Left iliac artery remains patent. Left hypogastric artery is occluded. Left to right femoral-femoral crossover graft remains patent. Visualized proximal bilateral superficial femoral artery remain patent. Degenerative changes of the spine. Grade 1 anterolisthesis of L5 on S1. Vacuum disc phenomenon at L4-L5. Bilateral L5 pars defect. No acute fracture identified. Mild bilateral hip osteoarthritis. IMPRESSION: Vascular: Patient is status post hybrid repair of ruptured abdominal aortic aneurysm with left-sided endovascular aorto-uni coverage, left to right femoral femoral crossover graft, and Amplatzer occlusion of the right iliac system. Aneurysm sac appears thrombosed, with no evidence of  type 1 or type 2 endoleak. The extension of suprarenal fixation is difficult to assess given the timing of the contrast bolus and absence of thin section imaging. There appears to be bare metal Z stent at the seal zone of the proximal endograft, with extension of the coverage to just below the celiac artery origin. The superior mesenteric artery remains patent at the origin, although it is uncertain if this is from an uncovered origin or collateral flow. Compared to the prior CT there is decreasing size of the bilateral kidneys, compatible with compromise of perfusion. The origin of the bilateral renal arteries is difficult to assess given the timing of the contrast bolus and absence of thin section imaging. Would recommend conventional CT angiogram for future follow-up imaging, if a more specific assessment of the mesenteric vasculature is warranted. Nonvascular: Rim enhancing fluid collection interposed between the right kidney and right psoas muscle, in the region of previous hematoma. The volume is significantly decreased from the comparison CT of 02/15/2015, and while the finding is favored to represent liquefying hematoma, sterility cannot be assessed with imaging. Low-density fluid collection of the bilateral inguinal region associated with the bilateral anastamosis of the femoral-femoral crossover bypass. Differential diagnosis includes seroma, liquefying hematoma, or infection, as sterility is not assessed by imaging. Mild body wall edema/anasarca, with bilateral moderate sized pleural effusions. Signed, Dulcy Fanny. Earleen Newport, DO [facility] Radiology Electronically Signed   By: Corrie Mckusick D.O.   On: 04/10/2015 11:23   Dg Esophagus  04/10/2015  CLINICAL DATA:  Dysphagia EXAM: ESOPHOGRAM/BARIUM SWALLOW TECHNIQUE: Single contrast examination was performed using  thin barium FLUOROSCOPY TIME:  Radiation Exposure Index (as provided by the fluoroscopic device): 10  mGy COMPARISON:  None. FINDINGS: Limited exam due the patient's condition . Thoracic esophagus is widely patent. No mass lesion or obstructing lesion. No reflux . IMPRESSION: Normal exam. Electronically Signed   By: Marcello Moores  Register  On: 04/10/2015 10:47   Dg Chest Port 1 View  04/10/2015  CLINICAL DATA:  65 year old female with shortness of breath and COPD. EXAM: PORTABLE CHEST 1 VIEW COMPARISON:  Radiograph dated 04/09/2015 FINDINGS: Right medial dialysis catheter in stable position. Single-view of the chest demonstrates emphysematous changes of the lungs with bibasilar atelectatic changes. There is prominence of the right hilar vasculature. Trace pleural effusion may be present. Findings may represent a degree of fluid overload. Pneumonia is not excluded. Clinical correlation is recommended. The cardiac silhouette is within normal limits. The osseous structures appear unremarkable. IMPRESSION: Emphysema with probable degree of congestion or fluid overload. Pneumonia is not excluded. Electronically Signed   By: Anner Crete M.D.   On: 04/10/2015 06:40    EKG:  Orders placed or performed during the hospital encounter of 04/09/15  . EKG 12-Lead  . EKG 12-Lead  . EKG 12-Lead  . EKG 12-Lead    ASSESSMENT AND PLAN:  Principal Problem:   Acute pulmonary edema with congestive heart failure (HCC) Active Problems:   Malignant essential hypertension   Dysphagia   Elevated troponin   Leukocytosis   Acute pulmonary edema ([place]) 1. Acute pulmonary edema due to acute diastolic CHF, status post hemodialysis 10th of January 2017, next hemodialysis today, vascular surgery is to discuss case with [facility] vascular surgery and make decisions about treatment plan for inguinal region fluid collection concerning for infectious 2. Malignant essential hypertension, continue metoprolol and add Avapro 3. Dysphagia. Patient underwent barium swallow study which was unremarkable, getting speech therapist input since  patient still complains of need to clear throat intermittently 4. Elevated troponin, likely demand ischemia per cardiology, continue current therapy, likely stress test as outpatient per Dr. Humphrey Rolls.  5. Leukocytosis, suspected due to low-grade infection in prior surgical site, per vascular surgery not on antibiotic therapy, may need to undergo inguinal region fluid collection drainage by vascular surgeon to rule out infection/get cultures, possible transfer to [facility] for further therapy 6. Thrombocytopenia, resolved 7. Anemia,  there was a 3 g drop since admission on January 9, vascular surgery is involved, will need to have drainage/tap here at [facility] or [place] to rule out hematoma.    Management plans discussed with the patient, family and they are in agreement.   DRUG ALLERGIES: No Known Allergies  CODE STATUS:     Code Status Orders        Start     Ordered   04/09/15 1024  Full code   Continuous     04/09/15 1024    Code Status History    Date Active Date Inactive Code Status Order ID Comments User Context   This patient has a current code status but no historical code status.      TOTAL TIME TAKING CARE OF THIS PATIENT: 45 minutes.  Discussed with vascular surgeon, Dr. Charline Bills M.D on 04/11/2015 at 1:43 PM  Between 7am to 6pm - Pager - [phone]  After 6pm go to [email or web address] - password [email or web address] [facility]  Office  [phone]  CC: Primary care physician; Arnette Norris, MD

## 2015-04-11 NOTE — Care Management (Signed)
Patient from [facility].  CSW following.  RNCM signing off

## 2015-04-11 NOTE — Progress Notes (Addendum)
SUBJECTIVE: Patient is feeling much better but did not get much sleep   Filed Vitals:   04/11/15 0438 04/11/15 0439 04/11/15 0732 04/11/15 0803  BP: 179/76 179/76  172/71  Pulse:  92  93  Temp:  98.4 F (36.9 C)    TempSrc:  Oral    Resp:  20    Height:      Weight:      SpO2:  90% 87% 93%    Intake/Output Summary (Last 24 hours) at 04/11/15 0848 Last data filed at 04/10/15 2221  Gross per 24 hour  Intake      3 ml  Output      0 ml  Net      3 ml    LABS: Basic Metabolic Panel:  Recent Labs  04/09/15 0532 04/09/15 1013 04/10/15 0817  NA 140  --  138  K 3.5  --  3.7  CL 103  --  101  CO2 25  --  28  GLUCOSE 127*  --  74  BUN 39*  --  27*  CREATININE 5.09* 5.20* 3.82*  CALCIUM 8.7*  --  8.3*   Liver Function Tests:  Recent Labs  04/09/15 0532  AST 28  ALT 11*  ALKPHOS 123  BILITOT 1.3*  PROT 7.2  ALBUMIN 3.3*   No results for input(s): LIPASE, AMYLASE in the last 72 hours. CBC:  Recent Labs  04/09/15 1013 04/10/15 0817 04/11/15 0438  WBC 7.5 5.6  --   HGB 10.6* 10.2*  --   HCT 31.5* 30.7*  --   MCV 99.0 98.0  --   PLT 264 134* 154   Cardiac Enzymes:  Recent Labs  04/09/15 1013 04/09/15 1644 04/09/15 2249  TROPONINI 0.11* 0.13* 0.08*   BNP: Invalid input(s): POCBNP D-Dimer: No results for input(s): DDIMER in the last 72 hours. Hemoglobin A1C: No results for input(s): HGBA1C in the last 72 hours. Fasting Lipid Panel: No results for input(s): CHOL, HDL, LDLCALC, TRIG, CHOLHDL, LDLDIRECT in the last 72 hours. Thyroid Function Tests: No results for input(s): TSH, T4TOTAL, T3FREE, THYROIDAB in the last 72 hours.  Invalid input(s): FREET3 Anemia Panel: No results for input(s): VITAMINB12, FOLATE, FERRITIN, TIBC, IRON, RETICCTPCT in the last 72 hours.   PHYSICAL EXAM General: Well developed, well nourished, in no acute distress HEENT:  Normocephalic and atramatic Neck:  No JVD.  Lungs: Clear bilaterally to auscultation and  percussion. Heart: HRRR . Normal S1 and S2 without gallops or murmurs.  Abdomen: Bowel sounds are positive, abdomen soft and non-tender  Msk:  Back normal, normal gait. Normal strength and tone for age. Extremities: No clubbing, cyanosis or edema.   Neuro: Alert and oriented X 3. Psych:  Good affect, responds appropriately  TELEMETRY: Sinus rhythm  ASSESSMENT AND PLAN: Mildly elevated troponin with the history of with CHF. Patient is gradually recovering well and getting rehabilitation. Will do outpatient stress test as she has history of two-vessel coronary artery disease on catheterization at [facility].  Principal Problem:   Acute pulmonary edema with congestive heart failure (HCC) Active Problems:   Leukocytosis   Malignant essential hypertension   Dysphagia   Elevated troponin   Acute pulmonary edema (HCC)    Neoma Laming A, MD, [facility] 04/11/2015 8:48 AM

## 2015-04-11 NOTE — Progress Notes (Signed)
[facility]  Daily Progress Note   Subjective  - * No surgery found *  Patient denies fever chills or malaise. She continues to have some tenderness of the right groin. She notes the left groin continues to drain periodically  Objective Filed Vitals:   04/11/15 1500 04/11/15 1530 04/11/15 1539 04/11/15 1845  BP: 173/90 170/84 184/85 150/65  Pulse: 100 110 107 94  Temp:   98.4 F (36.9 C)   TempSrc:   Oral   Resp: 19 14 22    Height:      Weight:      SpO2: 98% 98% 98%     Intake/Output Summary (Last 24 hours) at 04/11/15 2022 Last data filed at 04/11/15 1800  Gross per 24 hour  Intake    443 ml  Output   1501 ml  Net  -1058 ml    PULM  Normal effort , no use of accessory muscles CV  No JVD, RRR Abd      No distended, nontender VASC  right groin palpable mass consistent with a seroma, incision otherwise intact. Left groin incision open area no erythema or induration no odor serous fluid present  Laboratory CBC    Component Value Date/Time   WBC 5.6 04/10/2015 0817   WBC 9.3 07/19/2014 0841   HGB 10.2* 04/10/2015 0817   HGB 10.8* 07/19/2014 0841   HCT 30.7* 04/10/2015 0817   HCT 33.7* 07/19/2014 0841   PLT 154 04/11/2015 0438   PLT 237 07/19/2014 0841    BMET    Component Value Date/Time   NA 138 04/10/2015 0817   NA 135 07/22/2014 0645   K 3.7 04/10/2015 0817   K 3.7 07/22/2014 0645   CL 101 04/10/2015 0817   CL 112* 07/22/2014 0645   CO2 28 04/10/2015 0817   CO2 18* 07/22/2014 0645   GLUCOSE 74 04/10/2015 0817   GLUCOSE 77 07/22/2014 0645   BUN 27* 04/10/2015 0817   BUN 24* 07/22/2014 0645   CREATININE 3.82* 04/10/2015 0817   CREATININE 1.00 07/22/2014 0645   CALCIUM 8.3* 04/10/2015 0817   CALCIUM 8.3* 07/22/2014 0645   GFRNONAA 11* 04/10/2015 0817   GFRNONAA 59* 07/22/2014 0645   GFRNONAA 58* 05/07/2014 1302   GFRAA 13* 04/10/2015 0817   GFRAA >60 07/22/2014 0645   GFRAA >60 05/07/2014 1302     Assessment/Planning:    Status post ruptured abdominal aortic aneurysm repaired in November 2016: I have reviewed the CT scan personally. The patient has bilateral fluid collection surrounding the anastomoses of the femoral-femoral bypass. There does not appear to be fluid across the center portion of the graft. The actual aneurysm repair is somewhat removed from these collections extending from the abdominal aorta to the common iliac. There does not appear to be similar situation surrounding the aortic endograft. Given these findings he remains highly likely that her femoral-femoral bypass is infected. An end of that circumstance would need to be removed. My plan at this time is to discuss this situation with [facility]. Likely incision and drainage of both groins will be required with evaluation including cultures. Should the cultures positive then the graft would need to be removed in its entirety however with the cultures were to be negative and perhaps rotating sartorius muscle flaps over the exposed portions of the graft and using VAC dressings could allow for salvage of the femorofemoral. I am uncertain as to when I have the block time to put her on the schedule at  this moment.    [name], [name]  04/11/2015, 8:22 PM

## 2015-04-11 NOTE — Care Management Important Message (Signed)
Important Message  Patient Details  Name: [name] MRN: [id number] Date of Birth: Sep 27, 1950   Medicare Important Message Given:  Yes    [name] [name] 04/11/2015, 2:54 PM

## 2015-04-12 MED ORDER — IRBESARTAN 75 MG PO TABS
75.0000 mg | ORAL_TABLET | Freq: Two times a day (BID) | ORAL | Status: DC
  Administered 2015-04-12 – 2015-04-22 (×16): 75 mg via ORAL
  Filled 2015-04-12 (×18): qty 1

## 2015-04-12 NOTE — Progress Notes (Signed)
SUBJECTIVE: Patient is feeling much better today about to have a procedure with vascular.   Filed Vitals:   04/11/15 2043 04/11/15 2116 04/12/15 0259 04/12/15 0440  BP:  142/67  151/66  Pulse:  85  74  Temp:  98.4 F (36.9 C)  97.7 F (36.5 C)  TempSrc:  Oral  Oral  Resp:      Height:      Weight:      SpO2: 95% 95% 94% 95%    Intake/Output Summary (Last 24 hours) at 04/12/15 0906 Last data filed at 04/12/15 0700  Gross per 24 hour  Intake    346 ml  Output   1502 ml  Net  -1156 ml    LABS: Basic Metabolic Panel:  Recent Labs  04/09/15 1013 04/10/15 0817  NA  --  138  K  --  3.7  CL  --  101  CO2  --  28  GLUCOSE  --  74  BUN  --  27*  CREATININE 5.20* 3.82*  CALCIUM  --  8.3*   Liver Function Tests: No results for input(s): AST, ALT, ALKPHOS, BILITOT, PROT, ALBUMIN in the last 72 hours. No results for input(s): LIPASE, AMYLASE in the last 72 hours. CBC:  Recent Labs  04/09/15 1013 04/10/15 0817 04/11/15 0438  WBC 7.5 5.6  --   HGB 10.6* 10.2*  --   HCT 31.5* 30.7*  --   MCV 99.0 98.0  --   PLT 264 134* 154   Cardiac Enzymes:  Recent Labs  04/09/15 1013 04/09/15 1644 04/09/15 2249  TROPONINI 0.11* 0.13* 0.08*   BNP: Invalid input(s): POCBNP D-Dimer: No results for input(s): DDIMER in the last 72 hours. Hemoglobin A1C: No results for input(s): HGBA1C in the last 72 hours. Fasting Lipid Panel: No results for input(s): CHOL, HDL, LDLCALC, TRIG, CHOLHDL, LDLDIRECT in the last 72 hours. Thyroid Function Tests: No results for input(s): TSH, T4TOTAL, T3FREE, THYROIDAB in the last 72 hours.  Invalid input(s): FREET3 Anemia Panel: No results for input(s): VITAMINB12, FOLATE, FERRITIN, TIBC, IRON, RETICCTPCT in the last 72 hours.   PHYSICAL EXAM General: Well developed, well nourished, in no acute distress HEENT:  Normocephalic and atramatic Neck:  No JVD.  Lungs: Clear bilaterally to auscultation and percussion. Heart: HRRR . Normal S1 and  S2 without gallops or murmurs.  Abdomen: Bowel sounds are positive, abdomen soft and non-tender  Msk:  Back normal, normal gait. Normal strength and tone for age. Extremities: No clubbing, cyanosis or edema.   Neuro: Alert and oriented X 3. Psych:  Good affect, responds appropriately  TELEMETRY: Not on monitor  ASSESSMENT AND PLAN: Status post pulmonary edema and mildly elevated troponin and LV dysfunction. Patient probably has coronary artery disease is but is denies any chest pain or shortness of breath. Advise proceeding with vascular procedure with low to moderate risk. Will do outpatient Lexiscan Myoview to further evaluate coronary artery disease.  Principal Problem:   Acute pulmonary edema with congestive heart failure (HCC) Active Problems:   Leukocytosis   Malignant essential hypertension   Dysphagia   Elevated troponin   Acute pulmonary edema (HCC)    Tahjai Schetter A, MD, [facility] 04/12/2015 9:06 AM

## 2015-04-12 NOTE — Progress Notes (Signed)
[place] [facility] [place] NAME: [name]    MR#:  [id number]  DATE OF BIRTH:  1950/07/21  SUBJECTIVE:  CHIEF COMPLAINT:   Chief Complaint  Patient presents with  . Respiratory Distress   patient is 65 year old female with past medical history significant for history of AAA rupture with repair in November 2016 who presents to the hospital with complaints of severe shortness of breath. In emergency room, she was noted to have CHF, pulmonary edema and underwent ultrafiltration of 2 L, she was weaned off to room air and now. She was noted to have right lower extremity swelling and underwent ultrasound of right lower extremity revealing cystic lesion in the groin. CT of abdomen and pelvis was performed showing fluid collection in the inguinal region associated with fem-femoral bypass. Vascular surgeon, saw patient in consultation and is concerned about the possible low level infection in from femorofemoral bypass area, Dr. Delana Meyer is going to discuss case with [facility] vascular surgery and depending on [facility] vascular surgery recommendations, patient may need to be transferred to [facility] for further evaluation and treatment.  Patient feels satisfactory today, although remains on oxygen therapy. Despite hemodialysis yesterday. Remains afebrile . Patient requested dr Delana Meyer  to assume her care.  Review of Systems  Constitutional: Negative for fever, chills and weight loss.  HENT: Negative for congestion.   Eyes: Negative for blurred vision and double vision.  Respiratory: Positive for shortness of breath. Negative for cough, sputum production and wheezing.   Cardiovascular: Positive for leg swelling. Negative for chest pain, palpitations, orthopnea and PND.  Gastrointestinal: Negative for nausea, vomiting, abdominal pain, diarrhea, constipation and blood in stool.  Genitourinary: Negative for dysuria, urgency, frequency and hematuria.   Musculoskeletal: Negative for falls.  Neurological: Negative for dizziness, tremors, focal weakness and headaches.  Endo/Heme/Allergies: Does not bruise/bleed easily.  Psychiatric/Behavioral: Negative for depression. The patient does not have insomnia.     VITAL SIGNS: Blood pressure 169/79, pulse 78, temperature 98.3 F (36.8 C), temperature source Oral, resp. rate 18, height 5\' 4"  (1.626 m), weight 57.2 kg (126 lb 1.7 oz), last menstrual period 03/30/1988, SpO2 97 %.  PHYSICAL EXAMINATION:   GENERAL:  65 y.o.-year-old patient lying in the bed in mild respiratory distress., Tachypneic . Hoarse EYES: Pupils equal, round, reactive to light and accommodation. No scleral icterus. Extraocular muscles intact.  HEENT: Head atraumatic, normocephalic. Oropharynx and nasopharynx clear.  NECK:  Supple, no jugular venous distention. No thyroid enlargement, no tenderness.  LUNGS: Some diminished breath sounds bilaterally, no wheezing, basilar rales, rhonchi , and crepitations noted. Intermittently using accessory muscles of respiration, especially when talking,  tachypneic.  CARDIOVASCULAR: S1, S2 normal. No murmurs, rubs, or gallops.  ABDOMEN: Soft, nontender, uncomfortable on palpation in the inguinal region bilaterally , but no rebound or guarding and not able to appreciate any fluid collections , nondistended. Left groin area is dressed and draining. Bowel sounds present. No organomegaly or mass.  EXTREMITIES: No pedal edema, cyanosis, or clubbing.  NEUROLOGIC: Cranial nerves II through XII are intact. Muscle strength 5/5 in all extremities. Sensation intact. Gait not checked.  PSYCHIATRIC: The patient is alert and oriented x 3.  SKIN: No obvious rash, lesion, or ulcer.   ORDERS/RESULTS REVIEWED:   CBC  Recent Labs Lab 04/09/15 0532 04/09/15 1013 04/10/15 0817 04/11/15 0438  WBC 11.9* 7.5 5.6  --   HGB 13.0 10.6* 10.2*  --   HCT 38.3 31.5* 30.7*  --  PLT 420 264 134* 154  MCV 99.7  99.0 98.0  --   MCH 33.8 33.3 32.4  --   MCHC 33.9 33.7 33.1  --   RDW 18.8* 18.4* 18.3*  --    ------------------------------------------------------------------------------------------------------------------  Chemistries   Recent Labs Lab 04/09/15 0532 04/09/15 1013 04/10/15 0817  NA 140  --  138  K 3.5  --  3.7  CL 103  --  101  CO2 25  --  28  GLUCOSE 127*  --  74  BUN 39*  --  27*  CREATININE 5.09* 5.20* 3.82*  CALCIUM 8.7*  --  8.3*  AST 28  --   --   ALT 11*  --   --   ALKPHOS 123  --   --   BILITOT 1.3*  --   --    ------------------------------------------------------------------------------------------------------------------ estimated creatinine clearance is 12.5 mL/min (by C-G formula based on Cr of 3.82). ------------------------------------------------------------------------------------------------------------------ No results for input(s): TSH, T4TOTAL, T3FREE, THYROIDAB in the last 72 hours.  Invalid input(s): FREET3  Cardiac Enzymes  Recent Labs Lab 04/09/15 1013 04/09/15 1644 04/09/15 2249  TROPONINI 0.11* 0.13* 0.08*   ------------------------------------------------------------------------------------------------------------------ Invalid input(s): POCBNP ---------------------------------------------------------------------------------------------------------------  RADIOLOGY: No results found.  EKG:  Orders placed or performed during the hospital encounter of 04/09/15  . EKG 12-Lead  . EKG 12-Lead  . EKG 12-Lead  . EKG 12-Lead    ASSESSMENT AND PLAN:  Principal Problem:   Acute pulmonary edema with congestive heart failure (HCC) Active Problems:   Malignant essential hypertension   Dysphagia   Elevated troponin   Leukocytosis   Acute pulmonary edema ([place]) 1. Acute pulmonary edema due to acute diastolic CHF, status post hemodialysis 10th and 12th of January 2017, next hemodialysis tomorrow, vascular surgery is to discuss case  with [facility] vascular surgery and make decisions about treatment plan for inguinal region fluid collection concerning for infectious, patient requested DR Schnier  to assume her care 2. Malignant essential hypertension, continue metoprolol ,  Avapro, advance it to twice daily, low blood pressure readings and make decisions about advancement of medications if needed 3. Dysphagia. Patient underwent barium swallow study which was unremarkable, appreciated speech therapist input , no changes were recommended 4. Elevated troponin, likely demand ischemia per cardiology, continue current therapy, stress test as outpatient per Dr. Humphrey Rolls.  5. Leukocytosis, suspected due to low-grade infection in prior femorofemoral surgical site, per vascular surgery,  not on antibiotic therapy, may need to undergo inguinal region fluid collection drainage by vascular surgeon to rule out infection/get cultures, possible transfer to [facility] for further therapy. Vascular surgery is to discuss with [facility], plan is pending 6. Thrombocytopenia, resolved 7. Anemia,  there was a 3 g drop since admission on January 9, vascular surgery is involved, will need to have drainage/tap here at [facility] or [place] to rule out hematoma. Check hemoglobin level tomorrow morning, transfuse as needed   Management plans discussed with the patient, family and they are in agreement.   DRUG ALLERGIES: No Known Allergies  CODE STATUS:     Code Status Orders        Start     Ordered   04/09/15 1024  Full code   Continuous     04/09/15 1024    Code Status History    Date Active Date Inactive Code Status Order ID Comments User Context   This patient has a current code status but no historical code status.      TOTAL TIME  TAKING CARE OF THIS PATIENT: 40 minutes.  Discussed with nephrologist   Ksenia Kunz M.D on 04/12/2015 at 2:16 PM  Between 7am to 6pm - Pager - [phone]  After 6pm go to [email or web address] - password [email or web address]  [facility]  Office  [phone]  CC: Primary care physician; Arnette Norris, MD

## 2015-04-12 NOTE — Progress Notes (Signed)
A/O.  VSS.  No complaints of pain today.  Pedal pulses are weak with the right being greater than the left.  Incision areas on the abdomen have dressings that are dry and intact.  Patient had a bowel movement this morning.  Tolerating her diet well.  Patient has been moving some in the bed but has not gotten up.  No significant changes.  Continue to monitor.

## 2015-04-12 NOTE — Progress Notes (Signed)
Speech Therapy Note: reviewed chart notes labs/vitals; consulted NSG then pt and family re: diet toleration. Pt denied any trouble swallowing; NSG agreed stating pt is eating/drinking at meals and taking pills w/ liquid w/out deficits noted. Pt appears at her baseline w/ swallowing abilities; no further skilled ST services indicated at this time. NSG to reconsult if any change in status. Pt/family agreed.

## 2015-04-12 NOTE — Progress Notes (Signed)
Subjective:  Patient feels better today 1500 cc of fluid was removed with dialysis No other acute complaints   Objective:  Vital signs in last 24 hours:  Temp:  [97.7 F (36.5 C)-98.4 F (36.9 C)] 98.3 F (36.8 C) (01/13 1246) Pulse Rate:  [74-110] 78 (01/13 1246) Resp:  [14-22] 18 (01/13 1246) BP: (129-193)/(65-95) 169/79 mmHg (01/13 1246) SpO2:  [94 %-98 %] 97 % (01/13 1246)  Weight change:  Filed Weights   04/09/15 0940 04/09/15 1334 04/11/15 1310  Weight: 61.5 kg (135 lb 9.3 oz) 60 kg (132 lb 4.4 oz) 57.2 kg (126 lb 1.7 oz)    Intake/Output:    Intake/Output Summary (Last 24 hours) at 04/12/15 1327 Last data filed at 04/12/15 1300  Gross per 24 hour  Intake    496 ml  Output   1502 ml  Net  -1006 ml     Physical Exam: General: NAD  HEENT Anicteric, moist oral mucus membranes  Neck supple  Pulm/lungs Clear bilaterally, normal resp effort   CVS/Heart Irregular, no rub  Abdomen:  Soft, NT, scars from previous surgeries  Extremities: No peripheral edema  Neurologic: Alert, follows commands, wheelchair dependent at home  Skin: No acute rashes  Access: Rt IJ PC       Basic Metabolic Panel:   Recent Labs Lab 04/09/15 0532 04/09/15 1013 04/10/15 0817  NA 140  --  138  K 3.5  --  3.7  CL 103  --  101  CO2 25  --  28  GLUCOSE 127*  --  74  BUN 39*  --  27*  CREATININE 5.09* 5.20* 3.82*  CALCIUM 8.7*  --  8.3*     CBC:  Recent Labs Lab 04/09/15 0532 04/09/15 1013 04/10/15 0817 04/11/15 0438  WBC 11.9* 7.5 5.6  --   HGB 13.0 10.6* 10.2*  --   HCT 38.3 31.5* 30.7*  --   MCV 99.7 99.0 98.0  --   PLT 420 264 134* 154      Microbiology:  Recent Results (from the past 720 hour(s))  MRSA PCR Screening     Status: None   Collection Time: 04/09/15  9:45 AM  Result Value Ref Range Status   MRSA by PCR NEGATIVE NEGATIVE Final    Comment:        The GeneXpert MRSA Assay (FDA approved for NASAL specimens only), is one component of  a comprehensive MRSA colonization surveillance program. It is not intended to diagnose MRSA infection nor to guide or monitor treatment for MRSA infections.     Coagulation Studies: No results for input(s): LABPROT, INR in the last 72 hours.  Urinalysis: No results for input(s): COLORURINE, LABSPEC, PHURINE, GLUCOSEU, HGBUR, BILIRUBINUR, KETONESUR, PROTEINUR, UROBILINOGEN, NITRITE, LEUKOCYTESUR in the last 72 hours.  Invalid input(s): APPERANCEUR    Imaging: No results found.   Medications:     . acetaminophen  1,000 mg Oral 3 times per day  . antiseptic oral rinse  7 mL Mouth Rinse q12n4p  . aspirin EC  81 mg Oral Daily  . atorvastatin  40 mg Oral Daily  . budesonide-formoterol  2 puff Inhalation BID  . chlorhexidine  15 mL Mouth Rinse BID  . docusate sodium  100 mg Oral BID  . epoetin (EPOGEN/PROCRIT) injection  4,000 Units Intravenous Q T,Th,Sa-HD  . famotidine  20 mg Oral Daily  . fluticasone  2 spray Each Nare Daily  . ipratropium-albuterol  3 mL Nebulization Q6H  . irbesartan  75  mg Oral Daily  . levothyroxine  88 mcg Oral Daily  . lidocaine  1 patch Transdermal Q24H  . metoprolol  50 mg Oral BID  . multivitamin  1 tablet Oral Daily  . sodium chloride  1 spray Each Nare 5 X Daily  . sodium chloride  3 mL Intravenous Q12H  . sodium chloride  3 mL Intravenous Q12H   sodium chloride, acetaminophen, magnesium hydroxide, ondansetron **OR** ondansetron (ZOFRAN) IV, ondansetron, sodium chloride  Assessment/ Plan:  65 y.o. female with a PMHX of medical problems of COPD, hypertension, hyperlipidemia, vaginal intraepithelial neoplasia, emergent AAA repair status post rupture on November 18, left to right femoral to femoral bypass , acute renal failure leading to permanent dialysis, significant 3 vessel disease, was admitted on 04/09/2015 with severe shortness of breath after missing dialysis due to weather conditions.   1. End-stage renal disease. [place].  TTS. [facility] nephrology Scheduled hemodialysis tomorrow  2. Acute pulmonary edema, requiring NIPPV Emergent dialysis required for volume removal. 2000 cc removed 1/10 Clinically improved    3. Peripheral edema Fluid removal with dialysis as tolerated  4. Anemia of chronic kidney disease Hemoglobin 10.2. Low dose procrit with HD  5. Bilateral fluid collection surrounding the anastomoses of the femoral to femoral bypass. - Vascular surgery evaluation in progress - Will likely need incision and drainage to evaluate microbiology of infection      LOS: 3 [name] 1/13/20171:27 PM

## 2015-04-12 NOTE — Progress Notes (Signed)
Called Dr. Ether Griffins to get the cardiac monitor discontinued.  She gave the order over the phone.

## 2015-04-13 LAB — HEMOGLOBIN: Hemoglobin: 9.4 g/dL — ABNORMAL LOW (ref 12.0–16.0)

## 2015-04-13 NOTE — Progress Notes (Signed)
Vascular Surgery  Doing OK. Afebrile. Stable.  Notes mild groin and leg discomfort.  Bilateral groin dressings C/D/I  Fem-Fem anastomotic fluid collections Patient states she wishes to stay here for the I and D and any other potential intervention.   Will discuss with Dr. Delana Meyer on Monday.

## 2015-04-13 NOTE — Progress Notes (Signed)
SUBJECTIVE: Doing well   Filed Vitals:   04/12/15 2038 04/13/15 0250 04/13/15 0357 04/13/15 0739  BP: 165/81     Pulse: 90     Temp: 98.2 F (36.8 C)     TempSrc: Oral     Resp: 20     Height:      Weight:   129 lb 3 oz (58.6 kg)   SpO2: 98% 95%  93%    Intake/Output Summary (Last 24 hours) at 04/13/15 1013 Last data filed at 04/13/15 0905  Gross per 24 hour  Intake    490 ml  Output      0 ml  Net    490 ml    LABS: Basic Metabolic Panel: No results for input(s): NA, K, CL, CO2, GLUCOSE, BUN, CREATININE, CALCIUM, MG, PHOS in the last 72 hours. Liver Function Tests: No results for input(s): AST, ALT, ALKPHOS, BILITOT, PROT, ALBUMIN in the last 72 hours. No results for input(s): LIPASE, AMYLASE in the last 72 hours. CBC:  Recent Labs  04/11/15 0438 04/13/15 0529  HGB  --  9.4*  PLT 154  --    Cardiac Enzymes: No results for input(s): CKTOTAL, CKMB, CKMBINDEX, TROPONINI in the last 72 hours. BNP: Invalid input(s): POCBNP D-Dimer: No results for input(s): DDIMER in the last 72 hours. Hemoglobin A1C: No results for input(s): HGBA1C in the last 72 hours. Fasting Lipid Panel: No results for input(s): CHOL, HDL, LDLCALC, TRIG, CHOLHDL, LDLDIRECT in the last 72 hours. Thyroid Function Tests: No results for input(s): TSH, T4TOTAL, T3FREE, THYROIDAB in the last 72 hours.  Invalid input(s): FREET3 Anemia Panel: No results for input(s): VITAMINB12, FOLATE, FERRITIN, TIBC, IRON, RETICCTPCT in the last 72 hours.   PHYSICAL EXAM General: Well developed, well nourished, in no acute distress HEENT:  Normocephalic and atramatic Neck:  No JVD.  Lungs: Clear bilaterally to auscultation and percussion. Heart: HRRR . Normal S1 and S2 without gallops or murmurs.  Abdomen: Bowel sounds are positive, abdomen soft and non-tender  Msk:  Back normal, normal gait. Normal strength and tone for age. Extremities: No clubbing, cyanosis or edema.   Neuro: Alert and oriented X  3. Psych:  Good affect, responds appropriately  TELEMETRY:  ASSESSMENT AND PLAN: Normal LVEF, with mildly elevated troponin and 2 vessel disease diagnosed at [facility]. Clinically stabel to have surgery have vascular intends to do it.  Principal Problem:   Acute pulmonary edema with congestive heart failure (HCC) Active Problems:   Leukocytosis   Malignant essential hypertension   Dysphagia   Elevated troponin   Acute pulmonary edema (HCC)    Meyer Arora A, MD, [facility] 04/13/2015 10:13 AM

## 2015-04-13 NOTE — Progress Notes (Signed)
Pre-hd tx 

## 2015-04-13 NOTE — Progress Notes (Signed)
Subjective:  Patient feels well No acute complaints No SOB  Dialysis later today   Objective:  Vital signs in last 24 hours:  Temp:  [98.2 F (36.8 C)-98.3 F (36.8 C)] 98.2 F (36.8 C) (01/13 2038) Pulse Rate:  [78-90] 90 (01/13 2038) Resp:  [18-20] 20 (01/13 2038) BP: (165-169)/(79-81) 165/81 mmHg (01/13 2038) SpO2:  [93 %-98 %] 93 % (01/14 0739) Weight:  [58.6 kg (129 lb 3 oz)] 58.6 kg (129 lb 3 oz) (01/14 0357)  Weight change: 1.4 kg (3 lb 1.4 oz) Filed Weights   04/09/15 1334 04/11/15 1310 04/13/15 0357  Weight: 60 kg (132 lb 4.4 oz) 57.2 kg (126 lb 1.7 oz) 58.6 kg (129 lb 3 oz)    Intake/Output:    Intake/Output Summary (Last 24 hours) at 04/13/15 1143 Last data filed at 04/13/15 0905  Gross per 24 hour  Intake    680 ml  Output      0 ml  Net    680 ml     Physical Exam: General: NAD  HEENT Anicteric, moist oral mucus membranes  Neck supple  Pulm/lungs Clear bilaterally, normal resp effort   CVS/Heart Irregular, no rub  Abdomen:  Soft, NT, scars from previous surgeries  Extremities: No peripheral edema  Neurologic: Alert, follows commands, wheelchair dependent at home  Skin: No acute rashes  Access: Rt IJ PC       Basic Metabolic Panel:   Recent Labs Lab 04/09/15 0532 04/09/15 1013 04/10/15 0817  NA 140  --  138  K 3.5  --  3.7  CL 103  --  101  CO2 25  --  28  GLUCOSE 127*  --  74  BUN 39*  --  27*  CREATININE 5.09* 5.20* 3.82*  CALCIUM 8.7*  --  8.3*     CBC:  Recent Labs Lab 04/09/15 0532 04/09/15 1013 04/10/15 0817 04/11/15 0438 04/13/15 0529  WBC 11.9* 7.5 5.6  --   --   HGB 13.0 10.6* 10.2*  --  9.4*  HCT 38.3 31.5* 30.7*  --   --   MCV 99.7 99.0 98.0  --   --   PLT 420 264 134* 154  --       Microbiology:  Recent Results (from the past 720 hour(s))  MRSA PCR Screening     Status: None   Collection Time: 04/09/15  9:45 AM  Result Value Ref Range Status   MRSA by PCR NEGATIVE NEGATIVE Final    Comment:         The GeneXpert MRSA Assay (FDA approved for NASAL specimens only), is one component of a comprehensive MRSA colonization surveillance program. It is not intended to diagnose MRSA infection nor to guide or monitor treatment for MRSA infections.     Coagulation Studies: No results for input(s): LABPROT, INR in the last 72 hours.  Urinalysis: No results for input(s): COLORURINE, LABSPEC, PHURINE, GLUCOSEU, HGBUR, BILIRUBINUR, KETONESUR, PROTEINUR, UROBILINOGEN, NITRITE, LEUKOCYTESUR in the last 72 hours.  Invalid input(s): APPERANCEUR    Imaging: No results found.   Medications:     . antiseptic oral rinse  7 mL Mouth Rinse q12n4p  . aspirin EC  81 mg Oral Daily  . atorvastatin  40 mg Oral Daily  . budesonide-formoterol  2 puff Inhalation BID  . chlorhexidine  15 mL Mouth Rinse BID  . docusate sodium  100 mg Oral BID  . epoetin (EPOGEN/PROCRIT) injection  4,000 Units Intravenous Q T,Th,Sa-HD  . famotidine  20 mg Oral Daily  . fluticasone  2 spray Each Nare Daily  . ipratropium-albuterol  3 mL Nebulization Q6H  . irbesartan  75 mg Oral BID  . levothyroxine  88 mcg Oral Daily  . lidocaine  1 patch Transdermal Q24H  . metoprolol  50 mg Oral BID  . multivitamin  1 tablet Oral Daily  . sodium chloride  1 spray Each Nare 5 X Daily  . sodium chloride  3 mL Intravenous Q12H  . sodium chloride  3 mL Intravenous Q12H   sodium chloride, acetaminophen, magnesium hydroxide, ondansetron **OR** ondansetron (ZOFRAN) IV, ondansetron, sodium chloride  Assessment/ Plan:  65 y.o. female with a PMHX of medical problems of COPD, hypertension, hyperlipidemia, vaginal intraepithelial neoplasia, emergent AAA repair status post rupture on November 18, left to right femoral to femoral bypass , acute renal failure leading to permanent dialysis, significant 3 vessel disease, was admitted on 04/09/2015 with severe shortness of breath after missing dialysis due to weather conditions.   1.  End-stage renal disease. [place]. TTS. [facility] nephrology HD later today  2. Acute pulmonary edema, requiring NIPPV Emergent dialysis required for volume removal. 2000 cc removed 1/10 Clinically improved    3. Peripheral edema Fluid removal with dialysis as tolerated  4. Anemia of chronic kidney disease  Low dose procrit with HD  5. Bilateral fluid collection surrounding the anastomoses of the femoral to femoral bypass. - Vascular surgery evaluation in progress - Will likely need incision and drainage to evaluate microbiology of infection      LOS: 4 Quayshaun Hubbert 1/14/201711:43 AM

## 2015-04-13 NOTE — Progress Notes (Signed)
[facility] NAME: Doranna Czerwonka    MR#:  [id number]  DATE OF BIRTH:  05-Jan-1951  SUBJECTIVE:  CHIEF COMPLAINT:   Chief Complaint  Patient presents with  . Respiratory Distress   patient is 65 year old female with past medical history significant for history of AAA rupture with repair in November 2016 who presents to the hospital with complaints of severe shortness of breath. In emergency room, she was noted to have CHF, pulmonary edema and underwent ultrafiltration of 2 L,, HD per schedulle. She was noted to have right lower extremity swelling and underwent ultrasound of right lower extremity revealing cystic lesion in the groin. CT of abdomen and pelvis was performed showing fluid collection in the inguinal region associated with fem-femoral bypass. Vascular surgeon saw patient in consultation and was concerned about the possible infection around  femorofemoral bypass area, Dr. Delana Meyer was going to discuss case with [facility] vascular surgery , but the patient chose to have I&D and potential intervention at [facility].  Patient feels satisfactory today, although remains on oxygen therapy. Hemodialysis yesterday and likely today, per DR Candiss Norse. Remains afebrile . Patient requested dr Delana Meyer  to assume her care.    Review of Systems  Constitutional: Negative for fever, chills and weight loss.  HENT: Negative for congestion.   Eyes: Negative for blurred vision and double vision.  Respiratory: Positive for shortness of breath. Negative for cough, sputum production and wheezing.   Cardiovascular: Positive for leg swelling. Negative for chest pain, palpitations, orthopnea and PND.  Gastrointestinal: Negative for nausea, vomiting, abdominal pain, diarrhea, constipation and blood in stool.  Genitourinary: Negative for dysuria, urgency, frequency and hematuria.  Musculoskeletal: Negative for falls.  Neurological: Negative for dizziness, tremors,  focal weakness and headaches.  Endo/Heme/Allergies: Does not bruise/bleed easily.  Psychiatric/Behavioral: Negative for depression. The patient does not have insomnia.     VITAL SIGNS: Blood pressure 165/81, pulse 90, temperature 98.2 F (36.8 C), temperature source Oral, resp. rate 20, height 5\' 4"  (1.626 m), weight 58.6 kg (129 lb 3 oz), last menstrual period 03/30/1988, SpO2 93 %.  PHYSICAL EXAMINATION:   GENERAL:  65 y.o.-year-old patient lying in the bed in mild respiratory distress . Hoarse EYES: Pupils equal, round, reactive to light and accommodation. No scleral icterus. Extraocular muscles intact.  HEENT: Head atraumatic, normocephalic. Oropharynx and nasopharynx clear.  NECK:  Supple, no jugular venous distention. No thyroid enlargement, no tenderness.  LUNGS: Some diminished breath sounds bilaterally, no wheezing, basilar rales, rhonchi , and crepitations noted. Intermittently using accessory muscles of respiration, especially when talking,  tachypneic.  CARDIOVASCULAR: S1, S2 normal. No murmurs, rubs, or gallops.  ABDOMEN: Soft, nontender, uncomfortable on palpation in the inguinal region bilaterally , but no rebound or guarding and not able to appreciate any fluid collections , nondistended. Left groin area is dressed and draining. Bowel sounds present. No organomegaly or mass.  EXTREMITIES: No pedal edema, cyanosis, or clubbing.  NEUROLOGIC: Cranial nerves II through XII are intact. Muscle strength 5/5 in all extremities. Sensation intact. Gait not checked.  PSYCHIATRIC: The patient is alert and oriented x 3.  SKIN: No obvious rash, lesion, or ulcer.   ORDERS/RESULTS REVIEWED:   CBC  Recent Labs Lab 04/09/15 0532 04/09/15 1013 04/10/15 0817 04/11/15 0438 04/13/15 0529  WBC 11.9* 7.5 5.6  --   --   HGB 13.0 10.6* 10.2*  --  9.4*  HCT 38.3 31.5* 30.7*  --   --  PLT 420 264 134* 154  --   MCV 99.7 99.0 98.0  --   --   MCH 33.8 33.3 32.4  --   --   MCHC 33.9 33.7  33.1  --   --   RDW 18.8* 18.4* 18.3*  --   --    ------------------------------------------------------------------------------------------------------------------  Chemistries   Recent Labs Lab 04/09/15 0532 04/09/15 1013 04/10/15 0817  NA 140  --  138  K 3.5  --  3.7  CL 103  --  101  CO2 25  --  28  GLUCOSE 127*  --  74  BUN 39*  --  27*  CREATININE 5.09* 5.20* 3.82*  CALCIUM 8.7*  --  8.3*  AST 28  --   --   ALT 11*  --   --   ALKPHOS 123  --   --   BILITOT 1.3*  --   --    ------------------------------------------------------------------------------------------------------------------ estimated creatinine clearance is 12.5 mL/min (by C-G formula based on Cr of 3.82). ------------------------------------------------------------------------------------------------------------------ No results for input(s): TSH, T4TOTAL, T3FREE, THYROIDAB in the last 72 hours.  Invalid input(s): FREET3  Cardiac Enzymes  Recent Labs Lab 04/09/15 1013 04/09/15 1644 04/09/15 2249  TROPONINI 0.11* 0.13* 0.08*   ------------------------------------------------------------------------------------------------------------------ Invalid input(s): POCBNP ---------------------------------------------------------------------------------------------------------------  RADIOLOGY: No results found.  EKG:  Orders placed or performed during the hospital encounter of 04/09/15  . EKG 12-Lead  . EKG 12-Lead  . EKG 12-Lead  . EKG 12-Lead    ASSESSMENT AND PLAN:  Principal Problem:   Acute pulmonary edema with congestive heart failure (HCC) Active Problems:   Malignant essential hypertension   Dysphagia   Elevated troponin   Leukocytosis   Acute pulmonary edema ([place]) 1. Acute pulmonary edema due to acute diastolic CHF, status post hemodialysis 10th and 12th , 13th and likely 14th of January 2017, weaning off O2 as tolerated 2. Malignant essential hypertension, continue metoprolol ,   Avapro, advanced to twice daily, may need to add medications, follow after HD 3. Dysphagia. Patient underwent barium swallow study which was unremarkable, appreciated speech therapist input , no changes were recommended 4. Elevated troponin, likely demand ischemia per cardiology, continue current therapy, stress test as outpatient per Dr. Humphrey Rolls.  5. Leukocytosis, suspected due to low-grade infection in femorofemoral surgical site, per vascular surgery,  not on antibiotic therapy, will need to undergo inguinal region fluid collection drainage by vascular surgeon to rule out infection/get cultures, possible transfer to [facility] for further therapy. Vascular surgery is to discuss this with [facility], plan is pending 6. Thrombocytopenia, resolved, getting HIT test as patient  was told she was allergic to Heparin.  7. Anemia,  there was almost 4 g drop since admission on January 9, vascular surgery is involved, will need to have drainage/tap here at [facility] or [place] to rule out hematoma. Check hemoglobin level tomorrow morning, transfuse as needed   Management plans discussed with the patient, family and they are in agreement.   DRUG ALLERGIES: No Known Allergies  CODE STATUS:     Code Status Orders        Start     Ordered   04/09/15 1024  Full code   Continuous     04/09/15 1024    Code Status History    Date Active Date Inactive Code Status Order ID Comments User Context   This patient has a current code status but no historical code status.      TOTAL TIME TAKING CARE OF  THIS PATIENT: 50 minutes.   Discussed with nephrologist DR Candiss Norse, cardiologist DR Humphrey Rolls, emotional support provided, time spent about 15 minutes on discussions    Trystin Hargrove M.D on 04/13/2015 at 1:35 PM  Between 7am to 6pm - Pager - [phone]  After 6pm go to [email or web address] - password [email or web address] [facility]  Office  [phone]  CC: Primary care physician; Arnette Norris, MD

## 2015-04-14 ENCOUNTER — Inpatient Hospital Stay: Payer: Medicare Other

## 2015-04-14 LAB — HEMOGLOBIN: HEMOGLOBIN: 10.4 g/dL — AB (ref 12.0–16.0)

## 2015-04-14 MED ORDER — METOPROLOL TARTRATE 50 MG PO TABS
50.0000 mg | ORAL_TABLET | Freq: Three times a day (TID) | ORAL | Status: DC
  Administered 2015-04-14 – 2015-04-22 (×19): 50 mg via ORAL
  Filled 2015-04-14 (×22): qty 1

## 2015-04-14 MED ORDER — HYDROCOD POLST-CPM POLST ER 10-8 MG/5ML PO SUER
5.0000 mL | Freq: Two times a day (BID) | ORAL | Status: DC | PRN
  Administered 2015-04-14 – 2015-04-18 (×5): 5 mL via ORAL
  Filled 2015-04-14 (×5): qty 5

## 2015-04-14 MED ORDER — GUAIFENESIN-DM 100-10 MG/5ML PO SYRP
10.0000 mL | ORAL_SOLUTION | ORAL | Status: DC | PRN
  Administered 2015-04-14 – 2015-04-21 (×12): 10 mL via ORAL
  Filled 2015-04-14 (×12): qty 10

## 2015-04-14 NOTE — Progress Notes (Signed)
Subjective:  Patient feels well No acute complaints No SOB States she felt cold during dialysis last night 1800 cc were removed   Objective:  Vital signs in last 24 hours:  Temp:  [97.6 F (36.4 C)-98.8 F (37.1 C)] 98 F (36.7 C) (01/15 0759) Pulse Rate:  [79-124] 80 (01/15 0759) Resp:  [16-20] 17 (01/15 0759) BP: (148-195)/(59-93) 148/63 mmHg (01/15 0759) SpO2:  [92 %-100 %] 95 % (01/15 0759) Weight:  [58.7 kg (129 lb 6.6 oz)] 58.7 kg (129 lb 6.6 oz) (01/14 1450)  Weight change: 0.1 kg (3.5 oz) Filed Weights   04/11/15 1310 04/13/15 0357 04/13/15 1450  Weight: 57.2 kg (126 lb 1.7 oz) 58.6 kg (129 lb 3 oz) 58.7 kg (129 lb 6.6 oz)    Intake/Output:    Intake/Output Summary (Last 24 hours) at 04/14/15 1052 Last data filed at 04/14/15 0942  Gross per 24 hour  Intake    806 ml  Output      0 ml  Net    806 ml     Physical Exam: General: NAD  HEENT Anicteric, moist oral mucus membranes  Neck supple  Pulm/lungs Clear bilaterally, normal resp effort   CVS/Heart Irregular, no rub  Abdomen:  Soft, NT, scars from previous surgeries  Extremities: No peripheral edema  Neurologic: Alert, follows commands, wheelchair dependent at home  Skin: No acute rashes  Access: Rt IJ PC       Basic Metabolic Panel:   Recent Labs Lab 04/09/15 0532 04/09/15 1013 04/10/15 0817  NA 140  --  138  K 3.5  --  3.7  CL 103  --  101  CO2 25  --  28  GLUCOSE 127*  --  74  BUN 39*  --  27*  CREATININE 5.09* 5.20* 3.82*  CALCIUM 8.7*  --  8.3*     CBC:  Recent Labs Lab 04/09/15 0532 04/09/15 1013 04/10/15 0817 04/11/15 0438 04/13/15 0529  WBC 11.9* 7.5 5.6  --   --   HGB 13.0 10.6* 10.2*  --  9.4*  HCT 38.3 31.5* 30.7*  --   --   MCV 99.7 99.0 98.0  --   --   PLT 420 264 134* 154  --       Microbiology:  Recent Results (from the past 720 hour(s))  MRSA PCR Screening     Status: None   Collection Time: 04/09/15  9:45 AM  Result Value Ref Range Status   MRSA  by PCR NEGATIVE NEGATIVE Final    Comment:        The GeneXpert MRSA Assay (FDA approved for NASAL specimens only), is one component of a comprehensive MRSA colonization surveillance program. It is not intended to diagnose MRSA infection nor to guide or monitor treatment for MRSA infections.     Coagulation Studies: No results for input(s): LABPROT, INR in the last 72 hours.  Urinalysis: No results for input(s): COLORURINE, LABSPEC, PHURINE, GLUCOSEU, HGBUR, BILIRUBINUR, KETONESUR, PROTEINUR, UROBILINOGEN, NITRITE, LEUKOCYTESUR in the last 72 hours.  Invalid input(s): APPERANCEUR    Imaging: No results found.   Medications:     . antiseptic oral rinse  7 mL Mouth Rinse q12n4p  . aspirin EC  81 mg Oral Daily  . atorvastatin  40 mg Oral Daily  . budesonide-formoterol  2 puff Inhalation BID  . chlorhexidine  15 mL Mouth Rinse BID  . docusate sodium  100 mg Oral BID  . epoetin (EPOGEN/PROCRIT) injection  4,000 Units Intravenous  Q T,Th,Sa-HD  . famotidine  20 mg Oral Daily  . fluticasone  2 spray Each Nare Daily  . ipratropium-albuterol  3 mL Nebulization Q6H  . irbesartan  75 mg Oral BID  . levothyroxine  88 mcg Oral Daily  . lidocaine  1 patch Transdermal Q24H  . metoprolol  50 mg Oral BID  . multivitamin  1 tablet Oral Daily  . sodium chloride  1 spray Each Nare 5 X Daily  . sodium chloride  3 mL Intravenous Q12H  . sodium chloride  3 mL Intravenous Q12H   sodium chloride, acetaminophen, guaiFENesin-dextromethorphan, magnesium hydroxide, ondansetron **OR** ondansetron (ZOFRAN) IV, ondansetron, sodium chloride  Assessment/ Plan:  65 y.o. female with a PMHX of medical problems of COPD, hypertension, hyperlipidemia, vaginal intraepithelial neoplasia, emergent AAA repair status post rupture on November 18, left to right femoral to femoral bypass , acute renal failure leading to permanent dialysis, significant 3 vessel disease, was admitted on 04/09/2015 with severe  shortness of breath after missing dialysis due to weather conditions.   1. End-stage renal disease. [place]. TTS. [facility] nephrology - next HD Tuesday  2. Acute pulmonary edema, requiring NIPPV improved  3. Peripheral edema Fluid removal with dialysis as tolerated  4. Anemia of chronic kidney disease  Low dose procrit with HD  5. Bilateral fluid collection surrounding the anastomoses of the femoral to femoral bypass. - Vascular surgery evaluation in progress - Will likely need incision and drainage to evaluate microbiology of infection      LOS: 5 Lateefa Crosby 1/15/201710:52 AM

## 2015-04-14 NOTE — Plan of Care (Signed)
Problem: Safety: Goal: Ability to remain free from injury will improve Outcome: Progressing Fall precautions in place  Problem: Pain Managment: Goal: General experience of comfort will improve Outcome: Progressing Prn medications  Problem: Tissue Perfusion: Goal: Risk factors for ineffective tissue perfusion will decrease Outcome: Progressing TED/SCD  Problem: Activity: Goal: Ability to tolerate increased activity will improve Outcome: Not Progressing Remains on bedrest     Problem: Fluid Volume: Goal: Compliance with measures to maintain balanced fluid volume will improve Outcome: Progressing Dialysis yesterday

## 2015-04-14 NOTE — Progress Notes (Signed)
[facility] [place] NAME: [name]    MR#:  [id number]  DATE OF BIRTH:  [birth year]  SUBJECTIVE:  CHIEF COMPLAINT:   Chief Complaint  Patient presents with  . Respiratory Distress   patient is 65 year old female with past medical history significant for history of AAA rupture with repair in November 2016 who presents to the hospital with complaints of severe shortness of breath. In emergency room, she was noted to have CHF, pulmonary edema and underwent ultrafiltration of 2 L,, HD per schedulle. She was noted to have right lower extremity swelling and underwent ultrasound of right lower extremity revealing cystic lesion in the groin. CT of abdomen and pelvis was performed showing fluid collection in the inguinal region associated with fem-femoral bypass. Vascular surgeon saw patient in consultation and was concerned about the possible infection around  femorofemoral bypass area, Dr. Delana Meyer was going to discuss case with [facility] vascular surgery , but the patient chose to have I&D and potential intervention at [facility].  Patient feels satisfactory today, although remains on oxygen therapy. He had significant chills last night was hemodialysis , no high fever . Hemodialysis daily recently, per DR Candiss Norse.  Patient requested dr Delana Meyer  to assume her care.  Blood cultures for high fever and recurrent chills ordered not on antibiotics, so not to interfere with inguinal region fluid collection sampling for cultures. Initiate antibiotics therapy if patient develops high fever or becomes septic. Discussed with patient as well as her husband. Patient has been coughing, not too much or phlegm production  Review of Systems  Constitutional: Negative for fever, chills and weight loss.  HENT: Negative for congestion.   Eyes: Negative for blurred vision and double vision.  Respiratory: Positive for shortness of breath. Negative for cough, sputum production and  wheezing.   Cardiovascular: Positive for leg swelling. Negative for chest pain, palpitations, orthopnea and PND.  Gastrointestinal: Negative for nausea, vomiting, abdominal pain, diarrhea, constipation and blood in stool.  Genitourinary: Negative for dysuria, urgency, frequency and hematuria.  Musculoskeletal: Negative for falls.  Neurological: Negative for dizziness, tremors, focal weakness and headaches.  Endo/Heme/Allergies: Does not bruise/bleed easily.  Psychiatric/Behavioral: Negative for depression. The patient does not have insomnia.     VITAL SIGNS: Blood pressure 147/64, pulse 78, temperature 97.8 F (36.6 C), temperature source Oral, resp. rate 18, height 5\' 4"  (1.626 m), weight 58.7 kg (129 lb 6.6 oz), last menstrual period 03/30/1988, SpO2 96 %.  PHYSICAL EXAMINATION:   GENERAL:  65 y.o.-year-old patient lying in the bed in mild respiratory distress . Hoarse EYES: Pupils equal, round, reactive to light and accommodation. No scleral icterus. Extraocular muscles intact.  HEENT: Head atraumatic, normocephalic. Oropharynx and nasopharynx clear.  NECK:  Supple, no jugular venous distention. No thyroid enlargement, no tenderness.  LUNGS: Some diminished breath sounds bilaterally, no wheezing, basilar rales, rhonchi , , diffuse crepitations noted posteriorly in both lung fields. Intermittently using accessory muscles of respiration, especially when talking,  tachypneic. Intermittent cough with rattling in the chest but no significant sputum production CARDIOVASCULAR: S1, S2 normal. No murmurs, rubs, or gallops.  ABDOMEN: Soft, nontender, uncomfortable on palpation in the inguinal region bilaterally , but no rebound or guarding and not able to appreciate any fluid collections , nondistended. Left groin area is dressed and draining. Bowel sounds present. No organomegaly or mass.  EXTREMITIES: No pedal edema, cyanosis, or clubbing.  NEUROLOGIC: Cranial nerves II through XII are intact.  Muscle strength 5/5  in all extremities. Sensation intact. Gait not checked.  PSYCHIATRIC: The patient is alert and oriented x 3.  SKIN: No obvious rash, lesion, or ulcer.   ORDERS/RESULTS REVIEWED:   CBC  Recent Labs Lab 04/09/15 0532 04/09/15 1013 04/10/15 0817 04/11/15 0438 04/13/15 0529  WBC 11.9* 7.5 5.6  --   --   HGB 13.0 10.6* 10.2*  --  9.4*  HCT 38.3 31.5* 30.7*  --   --   PLT 420 264 134* 154  --   MCV 99.7 99.0 98.0  --   --   MCH 33.8 33.3 32.4  --   --   MCHC 33.9 33.7 33.1  --   --   RDW 18.8* 18.4* 18.3*  --   --    ------------------------------------------------------------------------------------------------------------------  Chemistries   Recent Labs Lab 04/09/15 0532 04/09/15 1013 04/10/15 0817  NA 140  --  138  K 3.5  --  3.7  CL 103  --  101  CO2 25  --  28  GLUCOSE 127*  --  74  BUN 39*  --  27*  CREATININE 5.09* 5.20* 3.82*  CALCIUM 8.7*  --  8.3*  AST 28  --   --   ALT 11*  --   --   ALKPHOS 123  --   --   BILITOT 1.3*  --   --    ------------------------------------------------------------------------------------------------------------------ estimated creatinine clearance is 12.5 mL/min (by C-G formula based on Cr of 3.82). ------------------------------------------------------------------------------------------------------------------ No results for input(s): TSH, T4TOTAL, T3FREE, THYROIDAB in the last 72 hours.  Invalid input(s): FREET3  Cardiac Enzymes  Recent Labs Lab 04/09/15 1013 04/09/15 1644 04/09/15 2249  TROPONINI 0.11* 0.13* 0.08*   ------------------------------------------------------------------------------------------------------------------ Invalid input(s): POCBNP ---------------------------------------------------------------------------------------------------------------  RADIOLOGY: No results found.  EKG:  Orders placed or performed during the hospital encounter of 04/09/15  . EKG 12-Lead  . EKG  12-Lead  . EKG 12-Lead  . EKG 12-Lead    ASSESSMENT AND PLAN:  Principal Problem:   Acute pulmonary edema with congestive heart failure (HCC) Active Problems:   Malignant essential hypertension   Dysphagia   Elevated troponin   Leukocytosis   Acute pulmonary edema ([place]) 1. Acute pulmonary edema due to acute diastolic CHF, status post hemodialysis 10th and 12th , 13th, 14th of January 2017, weaning off O2 as tolerated 2. Malignant essential hypertension, advance metoprolol ,  continue current dose of Avapro, follow blood pressure readings closely 3. Dysphagia. Patient underwent barium swallow study which was unremarkable, appreciated speech therapist input , no changes were recommended 4. Elevated troponin, likely demand ischemia per cardiology, continue current therapy, stress test as outpatient per Dr. Humphrey Rolls.  5. Leukocytosis, suspected due to low-grade infection in femorofemoral surgical site, per vascular surgery,  not on antibiotic therapy, will undergo inguinal region fluid collection drainage by vascular surgeon to rule out infection/get cultures, possible transfer to [facility] for further therapy. Vascular surgery is to discuss this with [facility], plan is pending. Patient had chills yesterday after hemodialysis and blood cultures are ordered for her if she has recurrent chills or high fevers. She is to be initiated on broad-spectrum antibiotic therapy if recurrent high fever or chills. Get chest x-ray to rule out pneumonia. Blood cultures done on January 15 2, pending 6. Thrombocytopenia, resolved, getting HIT test as patient  was told she was allergic to Heparin.  7. Anemia,  there was almost 4 g drop since admission on January 9, vascular surgery is involved, will need to have drainage/tap  here at [facility] or [place] to rule out hematoma/surgical history:. Check hemoglobin level stat today, transfuse as needed   Management plans discussed with the patient, family and they are in  agreement.   DRUG ALLERGIES: No Known Allergies  CODE STATUS:     Code Status Orders        Start     Ordered   04/09/15 1024  Full code   Continuous     04/09/15 1024    Code Status History    Date Active Date Inactive Code Status Order ID Comments User Context   This patient has a current code status but no historical code status.      TOTAL TIME TAKING CARE OF THIS PATIENT: 40 minutes.      Theodoro Grist M.D on 04/14/2015 at 1:26 PM  Between 7am to 6pm - Pager - [phone]  After 6pm go to [email or web address] - password [email or web address] [place] [facility]  Office  [phone]  CC: Primary care physician; Arnette Norris, MD

## 2015-04-15 LAB — CBC
HEMATOCRIT: 28.5 % — AB (ref 35.0–47.0)
HEMOGLOBIN: 9.6 g/dL — AB (ref 12.0–16.0)
MCH: 32.6 pg (ref 26.0–34.0)
MCHC: 33.8 g/dL (ref 32.0–36.0)
MCV: 96.3 fL (ref 80.0–100.0)
Platelets: 59 10*3/uL — ABNORMAL LOW (ref 150–440)
RBC: 2.96 MIL/uL — AB (ref 3.80–5.20)
RDW: 17.4 % — ABNORMAL HIGH (ref 11.5–14.5)
WBC: 4.2 10*3/uL (ref 3.6–11.0)

## 2015-04-15 MED ORDER — ACETYLCYSTEINE 20 % IN SOLN
4.0000 mL | Freq: Two times a day (BID) | RESPIRATORY_TRACT | Status: AC
  Administered 2015-04-15: 4 mL via RESPIRATORY_TRACT
  Filled 2015-04-15 (×2): qty 4

## 2015-04-15 MED ORDER — AMLODIPINE BESYLATE 5 MG PO TABS
5.0000 mg | ORAL_TABLET | Freq: Every day | ORAL | Status: DC
Start: 2015-04-15 — End: 2015-04-22
  Administered 2015-04-15 – 2015-04-21 (×5): 5 mg via ORAL
  Filled 2015-04-15 (×7): qty 1

## 2015-04-15 NOTE — Progress Notes (Signed)
Subjective:  Pt had dialysis On Saturday. Acute pulmonary edema significantly improved.  Objective:  Vital signs in last 24 hours:  Temp:  [97.9 F (36.6 C)-98.3 F (36.8 C)] 97.9 F (36.6 C) (01/16 1213) Pulse Rate:  [75-88] 76 (01/16 1442) Resp:  [12-20] 20 (01/16 1213) BP: (148-182)/(70-83) 148/70 mmHg (01/16 1442) SpO2:  [95 %-99 %] 99 % (01/16 1353) Weight:  [54.023 kg (119 lb 1.6 oz)] 54.023 kg (119 lb 1.6 oz) (01/16 0559)  Weight change: -4.677 kg (-10 lb 5 oz) Filed Weights   04/13/15 0357 04/13/15 1450 04/15/15 0559  Weight: 58.6 kg (129 lb 3 oz) 58.7 kg (129 lb 6.6 oz) 54.023 kg (119 lb 1.6 oz)    Intake/Output:    Intake/Output Summary (Last 24 hours) at 04/15/15 1513 Last data filed at 04/15/15 1300  Gross per 24 hour  Intake    800 ml  Output      0 ml  Net    800 ml     Physical Exam: General: NAD  HEENT Anicteric, moist oral mucus membranes  Neck supple  Pulm/lungs Clear bilaterally, normal resp effort   CVS/Heart Irregular, no rub  Abdomen:  Soft, NT, scars from previous surgeries  Extremities: No peripheral edema  Neurologic: Alert, follows commands, wheelchair dependent at home  Skin: No acute rashes  Access: Rt IJ PC       Basic Metabolic Panel:   Recent Labs Lab 04/09/15 0532 04/09/15 1013 04/10/15 0817  NA 140  --  138  K 3.5  --  3.7  CL 103  --  101  CO2 25  --  28  GLUCOSE 127*  --  74  BUN 39*  --  27*  CREATININE 5.09* 5.20* 3.82*  CALCIUM 8.7*  --  8.3*     CBC:  Recent Labs Lab 04/09/15 0532 04/09/15 1013 04/10/15 0817 04/11/15 0438 04/13/15 0529 04/14/15 1349 04/15/15 0955  WBC 11.9* 7.5 5.6  --   --   --  4.2  HGB 13.0 10.6* 10.2*  --  9.4* 10.4* 9.6*  HCT 38.3 31.5* 30.7*  --   --   --  28.5*  MCV 99.7 99.0 98.0  --   --   --  96.3  PLT 420 264 134* 154  --   --  59*      Microbiology:  Recent Results (from the past 720 hour(s))  MRSA PCR Screening     Status: None   Collection Time: 04/09/15   9:45 AM  Result Value Ref Range Status   MRSA by PCR NEGATIVE NEGATIVE Final    Comment:        The GeneXpert MRSA Assay (FDA approved for NASAL specimens only), is one component of a comprehensive MRSA colonization surveillance program. It is not intended to diagnose MRSA infection nor to guide or monitor treatment for MRSA infections.   CULTURE, BLOOD (ROUTINE X 2) w Reflex to PCR ID Panel     Status: None (Preliminary result)   Collection Time: 04/14/15 11:51 AM  Result Value Ref Range Status   Specimen Description BLOOD RIGHT WRIST  Final   Special Requests BOTTLES DRAWN AEROBIC AND ANAEROBIC  10CC  Final   Culture NO GROWTH < 12 HOURS  Final   Report Status PENDING  Incomplete  CULTURE, BLOOD (ROUTINE X 2) w Reflex to PCR ID Panel     Status: None (Preliminary result)   Collection Time: 04/14/15 12:02 PM  Result Value Ref Range Status  Specimen Description BLOOD LEFT WRIST  Final   Special Requests   Final    BOTTLES DRAWN AEROBIC AND ANAEROBIC  AER 10CC ANA 5CC   Culture NO GROWTH < 12 HOURS  Final   Report Status PENDING  Incomplete    Coagulation Studies: No results for input(s): LABPROT, INR in the last 72 hours.  Urinalysis: No results for input(s): COLORURINE, LABSPEC, PHURINE, GLUCOSEU, HGBUR, BILIRUBINUR, KETONESUR, PROTEINUR, UROBILINOGEN, NITRITE, LEUKOCYTESUR in the last 72 hours.  Invalid input(s): APPERANCEUR    Imaging: Dg Chest 2 View  04/14/2015  CLINICAL DATA:  Severe shortness of breath and cough for several days. 2 month status post abdominal aortic aneurysm repair. EXAM: CHEST  2 VIEW COMPARISON:  04/10/2015 FINDINGS: Right jugular dual-lumen central venous dialysis catheter remains in place. No pneumothorax visualized. Pulmonary hyperinflation again demonstrated. Small bilateral pleural effusions and bibasilar atelectasis show mild improvement since previous study. Abdominal aortic stent graft again noted. IMPRESSION: Interval improvement in small  bilateral pleural effusions and bibasilar atelectasis. Electronically Signed   By: Earle Gell M.D.   On: 04/14/2015 15:13     Medications:     . acetylcysteine  4 mL Nebulization BID  . antiseptic oral rinse  7 mL Mouth Rinse q12n4p  . aspirin EC  81 mg Oral Daily  . atorvastatin  40 mg Oral Daily  . budesonide-formoterol  2 puff Inhalation BID  . chlorhexidine  15 mL Mouth Rinse BID  . docusate sodium  100 mg Oral BID  . epoetin (EPOGEN/PROCRIT) injection  4,000 Units Intravenous Q T,Th,Sa-HD  . famotidine  20 mg Oral Daily  . fluticasone  2 spray Each Nare Daily  . ipratropium-albuterol  3 mL Nebulization Q6H  . irbesartan  75 mg Oral BID  . levothyroxine  88 mcg Oral Daily  . lidocaine  1 patch Transdermal Q24H  . metoprolol  50 mg Oral 3 times per day  . multivitamin  1 tablet Oral Daily  . sodium chloride  1 spray Each Nare 5 X Daily  . sodium chloride  3 mL Intravenous Q12H  . sodium chloride  3 mL Intravenous Q12H   sodium chloride, acetaminophen, chlorpheniramine-HYDROcodone, guaiFENesin-dextromethorphan, magnesium hydroxide, ondansetron **OR** ondansetron (ZOFRAN) IV, ondansetron, sodium chloride  Assessment/ Plan:  65 y.o. female with a PMHX of medical problems of COPD, hypertension, hyperlipidemia, vaginal intraepithelial neoplasia, emergent AAA repair status post rupture on November 18, left to right femoral to femoral bypass , acute renal failure leading to permanent dialysis, significant 3 vessel disease, was admitted on 04/09/2015 with severe shortness of breath after missing dialysis due to weather conditions.   1. End-stage renal disease. [name]. TTS. [facility] nephrology - no acute indication for dialysis today.  We will plan for dialysis again tomorrow per usual schedule.  2. Acute pulmonary edema, requiring NIPPV Significantly improved.  No indication for additional dialysis today.  3. Peripheral edema Ultrafiltration target2 kg tomorrow.  4.  Anemia of chronic kidney disease Hemoglobin currently 9.6.  Continue Epogen 4000 units IV with dialysis.  5. Bilateral fluid collection surrounding the anastomoses of the femoral to femoral bypass. - Vascular surgery evaluation in progress - Will likely need incision and drainage to evaluate microbiology of infection      LOS: [place], [name] 1/16/20173:13 PM

## 2015-04-15 NOTE — Progress Notes (Signed)
Nutrition Follow-up     INTERVENTION:  Meals and snacks: Cater to pt preferences, discussed diet restrictions with pt.  May need to liberalize diet restrictions if unable to meet nutritional needs Medical Nutrition Supplement Therapy: Pt does not like magic cup and asking for it to be discontinued at this time   NUTRITION DIAGNOSIS:   Inadequate oral intake related to chronic illness, poor appetite as evidenced by estimated needs, per patient/family report.    GOAL:   Patient will meet greater than or equal to 90% of their needs    MONITOR:    (Energy Intake, Anthropometrics, Digestive System, Electroyte/Renal Profile)  REASON FOR ASSESSMENT:   Diagnosis    ASSESSMENT:       Current Nutrition: Per I and O sheet intake mostly 100% last few meals but has been 25-30% of meals   Gastrointestinal Profile: Last BM: 1/14   Scheduled Medications:  . acetylcysteine  4 mL Nebulization BID  . amLODipine  5 mg Oral Daily  . antiseptic oral rinse  7 mL Mouth Rinse q12n4p  . aspirin EC  81 mg Oral Daily  . atorvastatin  40 mg Oral Daily  . budesonide-formoterol  2 puff Inhalation BID  . chlorhexidine  15 mL Mouth Rinse BID  . docusate sodium  100 mg Oral BID  . epoetin (EPOGEN/PROCRIT) injection  4,000 Units Intravenous Q T,Th,Sa-HD  . famotidine  20 mg Oral Daily  . fluticasone  2 spray Each Nare Daily  . ipratropium-albuterol  3 mL Nebulization Q6H  . irbesartan  75 mg Oral BID  . levothyroxine  88 mcg Oral Daily  . lidocaine  1 patch Transdermal Q24H  . metoprolol  50 mg Oral 3 times per day  . multivitamin  1 tablet Oral Daily  . sodium chloride  1 spray Each Nare 5 X Daily  . sodium chloride  3 mL Intravenous Q12H  . sodium chloride  3 mL Intravenous Q12H       Electrolyte/Renal Profile and Glucose Profile:   Recent Labs Lab 04/09/15 0532 04/09/15 1013 04/10/15 0817  NA 140  --  138  K 3.5  --  3.7  CL 103  --  101  CO2 25  --  28  BUN 39*  --  27*   CREATININE 5.09* 5.20* 3.82*  CALCIUM 8.7*  --  8.3*  GLUCOSE 127*  --  74   Protein Profile:   Recent Labs Lab 04/09/15 0532  ALBUMIN 3.3*     Unable to complete Nutrition-Focused physical exam at this time.  Pt with guest in room, physical assessment deferred at this time.    Weight Trend since Admission: Filed Weights   04/13/15 0357 04/13/15 1450 04/15/15 0559  Weight: 129 lb 3 oz (58.6 kg) 129 lb 6.6 oz (58.7 kg) 119 lb 1.6 oz (54.023 kg)      Diet Order:  Diet renal with fluid restriction Fluid restriction:: 1200 mL Fluid; Room service appropriate?: Yes; Fluid consistency:: Thin  Skin:   (no pressure ulcer)   Height:   Ht Readings from Last 1 Encounters:  04/09/15 5\' 4"  (1.626 m)    Weight:   Wt Readings from Last 1 Encounters:  04/15/15 119 lb 1.6 oz (54.023 kg)    Ideal Body Weight:     BMI:  Body mass index is 20.43 kg/(m^2).  Estimated Nutritional Needs:   Kcal:  [id number] kcals (BEE 1155, 1.3 AF, 1.1-1.3 IF)   Protein:  72-90g (1.2-1.5 g/kg)   Fluid:  1000 mL plus UOP   EDUCATION NEEDS:   Education needs addressed  MODERATE Care Level  Dalilah Curlin B. Zenia Resides, [place], [place] (pager) Weekend/On-Call pager [phone])

## 2015-04-15 NOTE — Progress Notes (Signed)
[facility]  Daily Progress Note   Subjective  - * No surgery found *  She denies groin pain she does note she has had increasing coughing  Objective Filed Vitals:   04/15/15 1213 04/15/15 1353 04/15/15 1442 04/15/15 1632  BP: 155/81  148/70 171/80  Pulse: 75  76 78  Temp: 97.9 F (36.6 C)     TempSrc: Oral     Resp: 20     Height:      Weight:      SpO2: 99% 99%      Intake/Output Summary (Last 24 hours) at 04/15/15 1838 Last data filed at 04/15/15 1658  Gross per 24 hour  Intake    360 ml  Output      0 ml  Net    360 ml    PULM  Normal effort , no use of accessory muscles CV  No JVD, RRR Abd      No distended, nontender VASC left groin is draining right groin remains tender with a non-fluctuant mass  Laboratory CBC    Component Value Date/Time   WBC 4.2 04/15/2015 0955   WBC 9.3 07/19/2014 0841   HGB 9.6* 04/15/2015 0955   HGB 10.8* 07/19/2014 0841   HCT 28.5* 04/15/2015 0955   HCT 33.7* 07/19/2014 0841   PLT 59* 04/15/2015 0955   PLT 237 07/19/2014 0841    BMET    Component Value Date/Time   NA 138 04/10/2015 0817   NA 135 07/22/2014 0645   K 3.7 04/10/2015 0817   K 3.7 07/22/2014 0645   CL 101 04/10/2015 0817   CL 112* 07/22/2014 0645   CO2 28 04/10/2015 0817   CO2 18* 07/22/2014 0645   GLUCOSE 74 04/10/2015 0817   GLUCOSE 77 07/22/2014 0645   BUN 27* 04/10/2015 0817   BUN 24* 07/22/2014 0645   CREATININE 3.82* 04/10/2015 0817   CREATININE 1.00 07/22/2014 0645   CALCIUM 8.3* 04/10/2015 0817   CALCIUM 8.3* 07/22/2014 0645   GFRNONAA 11* 04/10/2015 0817   GFRNONAA 59* 07/22/2014 0645   GFRNONAA 58* 05/07/2014 1302   GFRAA 13* 04/10/2015 0817   GFRAA >60 07/22/2014 0645   GFRAA >60 05/07/2014 1302    Assessment/Planning:   Atherosclerotic occlusive disease in association with aneurysm status post repair for rupture of the aneurysm now with aorta uni-iliac stent and a femorofemoral bypass. At this time there is  fluid around the femorofemoral in both right and left groins I will plan for surgery on Wednesday provided her pulmonary status is acceptable    Katha Cabal  04/15/2015, 6:38 PM

## 2015-04-15 NOTE — Care Management Important Message (Signed)
Important Message  Patient Details  Name: [name] MRN: [id number] Date of Birth: [birth year]   Medicare Important Message Given:  Yes    [name] A Charvez [place] 04/15/2015, 10:54 AM

## 2015-04-15 NOTE — Progress Notes (Signed)
Physical Therapy Treatment Patient Details Name: [name] MRN: [id number] DOB: Aug 25, 1950 Today's Date: 04/15/2015    History of Present Illness      PT Comments    Pt quickly fatigued but demonstrated good tolerance to session today.  Demonstrated good safety with squat pivot transfer from bed and performed stand pivot transfer with RW each with modAx1.  She was able to demonstrate full knee extension with standing and stand tolerance with RW up to 30sec.  She would con't to benefit from skilled PT to improve endurance and functional activity tolerance.   Follow Up Recommendations  SNF     Equipment Recommendations       Recommendations for Other Services       Precautions / Restrictions Restrictions Weight Bearing Restrictions: No    Mobility  Bed Mobility Overal bed mobility: Needs Assistance Bed Mobility: Supine to Sit     Supine to sit: Min assist     General bed mobility comments: HOB elevated and use of rails, minassist for trunk support  Transfers Overall transfer level: Needs assistance Equipment used: 1 person hand held assist Transfers: Stand Pivot Transfers Sit to Stand: Mod assist Stand pivot transfers: Mod assist       General transfer comment: Pt performed stand pivot to chair, performed sit to stand with RW from recliner chair   Ambulation/Gait                 Stairs            Wheelchair Mobility    Modified Rankin (Stroke Patients Only)       Balance Overall balance assessment: Needs assistance Sitting-balance support: Feet supported Sitting balance-Leahy Scale: Good Sitting balance - Comments: Pt able to maintain upright posture with feet supported    Standing balance support: Bilateral upper extremity supported Standing balance-Leahy Scale: Fair Standing balance comment: Pt maintained static stand x 30 sec maintaining F+ balance with fully exteded legs                    Cognition  Arousal/Alertness: Awake/alert Behavior During Therapy: WFL for tasks assessed/performed Overall Cognitive Status: Within Functional Limits for tasks assessed                      Exercises Other Exercises Other Exercises: Performed seeated LAQ, ankle pumps, hip abd, pillow squeezes, hip flexion x 10 b/l with occasional AA for RLE    General Comments        Pertinent Vitals/Pain Pain Assessment: No/denies pain    Home Living                      Prior Function            PT Goals (current goals can now be found in the care plan section) Acute Rehab PT Goals Patient Stated Goal: I'd like to sit up PT Goal Formulation: With patient Time For Goal Achievement: 04/25/15 Potential to Achieve Goals: Good    Frequency  Min 2X/week    PT Plan      Co-evaluation             End of Session Equipment Utilized During Treatment: Gait belt Activity Tolerance: Patient tolerated treatment well Patient left: in chair;with call bell/phone within reach;with chair alarm set;with family/visitor present     Time: [id number] PT Time Calculation (min) (ACUTE ONLY): 30 min  Charges:  $Therapeutic Exercise: 8-22 mins $Therapeutic Activity: 8-22 mins  G Codes:      [name] 05/13/2015, 1:18 PM [name], PTA

## 2015-04-15 NOTE — Progress Notes (Signed)
[facility] [place] NAME: [name]    MR#:  [id number]  DATE OF BIRTH:  1951-03-22  SUBJECTIVE:  CHIEF COMPLAINT:   Chief Complaint  Patient presents with  . Respiratory Distress   patient is 65 year old female with past medical history significant for history of AAA rupture with repair in November 2016 who presents to the hospital with complaints of severe shortness of breath. In emergency room, she was noted to have CHF, pulmonary edema and underwent ultrafiltration of 2 L,, HD per schedulle. She was noted to have right lower extremity swelling and underwent ultrasound of right lower extremity revealing cystic lesion in the groin. CT of abdomen and pelvis was performed showing fluid collection in the inguinal region associated with fem-femoral bypass. Vascular surgeon saw patient in consultation and was concerned about the possible infection around  femorofemoral bypass area, Dr. Delana Meyer was going to discuss case with [facility] vascular surgery , but the patient chose to have I&D and potential intervention at [facility]  Patient feels satisfactory today, although remains on oxygen therapy. Vascular surgery is to decide about patient's disposition and treatment plan. Patient's pulmonary edema is resolving with hemodialysis as hemodialysis is tomorrow.   Review of Systems  Constitutional: Negative for fever, chills and weight loss.  HENT: Negative for congestion.   Eyes: Negative for blurred vision and double vision.  Respiratory: Positive for shortness of breath. Negative for cough, sputum production and wheezing.   Cardiovascular: Positive for leg swelling. Negative for chest pain, palpitations, orthopnea and PND.  Gastrointestinal: Negative for nausea, vomiting, abdominal pain, diarrhea, constipation and blood in stool.  Genitourinary: Negative for dysuria, urgency, frequency and hematuria.  Musculoskeletal: Negative for falls.   Neurological: Negative for dizziness, tremors, focal weakness and headaches.  Endo/Heme/Allergies: Does not bruise/bleed easily.  Psychiatric/Behavioral: Negative for depression. The patient does not have insomnia.     VITAL SIGNS: Blood pressure 148/70, pulse 76, temperature 97.9 F (36.6 C), temperature source Oral, resp. rate 20, height 5\' 4"  (1.626 m), weight 54.023 kg (119 lb 1.6 oz), last menstrual period 03/30/1988, SpO2 99 %.  PHYSICAL EXAMINATION:   GENERAL:  65 y.o.-year-old patient lying in the bed in mild respiratory distress . Hoarse EYES: Pupils equal, round, reactive to light and accommodation. No scleral icterus. Extraocular muscles intact.  HEENT: Head atraumatic, normocephalic. Oropharynx and nasopharynx clear.  NECK:  Supple, no jugular venous distention. No thyroid enlargement, no tenderness.  LUNGS: Some diminished breath sounds bilaterally, no wheezing, basilar rales, rhonchi , , diffuse crepitations noted posteriorly in both lung fields. Intermittently using accessory muscles of respiration, especially when talking,  tachypneic. Intermittent cough with rattling in the chest but no significant sputum production CARDIOVASCULAR: S1, S2 normal. No murmurs, rubs, or gallops.  ABDOMEN: Soft, nontender, uncomfortable on palpation in the inguinal region bilaterally , but no rebound or guarding and not able to appreciate any fluid collections , nondistended. Left groin area is dressed and draining. Bowel sounds present. No organomegaly or mass.  EXTREMITIES: No pedal edema, cyanosis, or clubbing.  NEUROLOGIC: Cranial nerves II through XII are intact. Muscle strength 5/5 in all extremities. Sensation intact. Gait not checked.  PSYCHIATRIC: The patient is alert and oriented x 3.  SKIN: No obvious rash, lesion, or ulcer.   ORDERS/RESULTS REVIEWED:   CBC  Recent Labs Lab 04/09/15 0532 04/09/15 1013 04/10/15 0817 04/11/15 0438 04/13/15 0529 04/14/15 1349 04/15/15 0955   WBC 11.9* 7.5 5.6  --   --   --  4.2  HGB 13.0 10.6* 10.2*  --  9.4* 10.4* 9.6*  HCT 38.3 31.5* 30.7*  --   --   --  28.5*  PLT 420 264 134* 154  --   --  59*  MCV 99.7 99.0 98.0  --   --   --  96.3  MCH 33.8 33.3 32.4  --   --   --  32.6  MCHC 33.9 33.7 33.1  --   --   --  33.8  RDW 18.8* 18.4* 18.3*  --   --   --  17.4*   ------------------------------------------------------------------------------------------------------------------  Chemistries   Recent Labs Lab 04/09/15 0532 04/09/15 1013 04/10/15 0817  NA 140  --  138  K 3.5  --  3.7  CL 103  --  101  CO2 25  --  28  GLUCOSE 127*  --  74  BUN 39*  --  27*  CREATININE 5.09* 5.20* 3.82*  CALCIUM 8.7*  --  8.3*  AST 28  --   --   ALT 11*  --   --   ALKPHOS 123  --   --   BILITOT 1.3*  --   --    ------------------------------------------------------------------------------------------------------------------ estimated creatinine clearance is 12.3 mL/min (by C-G formula based on Cr of 3.82). ------------------------------------------------------------------------------------------------------------------ No results for input(s): TSH, T4TOTAL, T3FREE, THYROIDAB in the last 72 hours.  Invalid input(s): FREET3  Cardiac Enzymes  Recent Labs Lab 04/09/15 1013 04/09/15 1644 04/09/15 2249  TROPONINI 0.11* 0.13* 0.08*   ------------------------------------------------------------------------------------------------------------------ Invalid input(s): POCBNP ---------------------------------------------------------------------------------------------------------------  RADIOLOGY: Dg Chest 2 View  04/14/2015  CLINICAL DATA:  Severe shortness of breath and cough for several days. 2 month status post abdominal aortic aneurysm repair. EXAM: CHEST  2 VIEW COMPARISON:  04/10/2015 FINDINGS: Right jugular dual-lumen central venous dialysis catheter remains in place. No pneumothorax visualized. Pulmonary hyperinflation again  demonstrated. Small bilateral pleural effusions and bibasilar atelectasis show mild improvement since previous study. Abdominal aortic stent graft again noted. IMPRESSION: Interval improvement in small bilateral pleural effusions and bibasilar atelectasis. Electronically Signed   By: Earle Gell M.D.   On: 04/14/2015 15:13    EKG:  Orders placed or performed during the hospital encounter of 04/09/15  . EKG 12-Lead  . EKG 12-Lead  . EKG 12-Lead  . EKG 12-Lead    ASSESSMENT AND PLAN:  Principal Problem:   Acute pulmonary edema with congestive heart failure (HCC) Active Problems:   Malignant essential hypertension   Dysphagia   Elevated troponin   Leukocytosis   Acute pulmonary edema ([place]) 1. Acute pulmonary edema due to acute diastolic CHF, status post hemodialysis 10th and 12th , 13th, 14th of January 2017, weaning off O2 as tolerated 2. Malignant essential hypertension, continue metoprolol ,  continue current dose of Avapro, add low-dose of Norvasc, follow blood pressure readings closely 3. Dysphagia. Patient underwent barium swallow study which was unremarkable, appreciated speech therapist input , no changes were recommended 4. Elevated troponin, likely demand ischemia per cardiology, continue current therapy, stress test as outpatient per Dr. Humphrey Rolls.  5. Leukocytosis, suspected due to low-grade infection in femorofemoral surgical site, per vascular surgery,  not on antibiotic therapy, will undergo inguinal region fluid collection drainage by vascular surgeon to rule out infection/get cultures, possible transfer to [facility] for further therapy. Vascular surgery is to discuss this with [facility], plan is pending. Attempted to speak to Dr. Delana Meyer, unavailable,  discussed with Dr. Lucky Cowboy who is going to  discuss this with Dr. Delana Meyer .  Blood cultures done on January 15 2, negative 6. Thrombocytopenia, resolved, getting HIT test as patient  was told she was allergic to Heparin.  7. Anemia,   there was almost 4 g drop since admission on January 9, vascular surgery is involved, will need to have drainage/tap here at [facility] or [place] to rule out hematoma. Hemoglobin level is stable  Management plans discussed with the patient, family and they are in agreement.   DRUG ALLERGIES: No Known Allergies  CODE STATUS:     Code Status Orders        Start     Ordered   04/09/15 1024  Full code   Continuous     04/09/15 1024    Code Status History    Date Active Date Inactive Code Status Order ID Comments User Context   This patient has a current code status but no historical code status.      TOTAL TIME TAKING CARE OF THIS PATIENT: 40 minutes.   Discussed with Dr. Lucky Cowboy, Dr. Buren Kos M.D on 04/15/2015 at 3:20 PM  Between 7am to 6pm - Pager - [phone]  After 6pm go to [email or web address] - password [email or web address] [facility]  Office  [phone]  CC: Primary care physician; Arnette Norris, MD

## 2015-04-15 NOTE — Progress Notes (Signed)
SUBJECTIVE: Patient feeling fine   Filed Vitals:   04/15/15 0557 04/15/15 0559 04/15/15 0805 04/15/15 0854  BP: 182/75   167/80  Pulse: 85   77  Temp: 98.1 F (36.7 C)     TempSrc: Oral     Resp: 12     Height:      Weight:  119 lb 1.6 oz (54.023 kg)    SpO2: 95%  95%     Intake/Output Summary (Last 24 hours) at 04/15/15 1022 Last data filed at 04/15/15 0735  Gross per 24 hour  Intake    760 ml  Output      0 ml  Net    760 ml    LABS: Basic Metabolic Panel: No results for input(s): NA, K, CL, CO2, GLUCOSE, BUN, CREATININE, CALCIUM, MG, PHOS in the last 72 hours. Liver Function Tests: No results for input(s): AST, ALT, ALKPHOS, BILITOT, PROT, ALBUMIN in the last 72 hours. No results for input(s): LIPASE, AMYLASE in the last 72 hours. CBC:  Recent Labs  04/14/15 1349 04/15/15 0955  WBC  --  4.2  HGB 10.4* 9.6*  HCT  --  28.5*  MCV  --  96.3  PLT  --  59*   Cardiac Enzymes: No results for input(s): CKTOTAL, CKMB, CKMBINDEX, TROPONINI in the last 72 hours. BNP: Invalid input(s): POCBNP D-Dimer: No results for input(s): DDIMER in the last 72 hours. Hemoglobin A1C: No results for input(s): HGBA1C in the last 72 hours. Fasting Lipid Panel: No results for input(s): CHOL, HDL, LDLCALC, TRIG, CHOLHDL, LDLDIRECT in the last 72 hours. Thyroid Function Tests: No results for input(s): TSH, T4TOTAL, T3FREE, THYROIDAB in the last 72 hours.  Invalid input(s): FREET3 Anemia Panel: No results for input(s): VITAMINB12, FOLATE, FERRITIN, TIBC, IRON, RETICCTPCT in the last 72 hours.   PHYSICAL EXAM General: Well developed, well nourished, in no acute distress HEENT:  Normocephalic and atramatic Neck:  No JVD.  Lungs: Clear bilaterally to auscultation and percussion. Heart: HRRR . Normal S1 and S2 without gallops or murmurs.  Abdomen: Bowel sounds are positive, abdomen soft and non-tender  Msk:  Back normal, normal gait. Normal strength and tone for age. Extremities: No  clubbing, cyanosis or edema.   Neuro: Alert and oriented X 3. Psych:  Good affect, responds appropriately  TELEMETRY:  ASSESSMENT AND PLAN: S/P pulmonary edema and elevated troponins , 2 vessel CAD and diastolic dysfunction. Patient has left groin drainage from aorto-femoral bypass surgery in groin after rupture of artic aneurysm. Cultures to be done by vascular. Low risk for this procedure.  Principal Problem:   Acute pulmonary edema with congestive heart failure (HCC) Active Problems:   Leukocytosis   Malignant essential hypertension   Dysphagia   Elevated troponin   Acute pulmonary edema (HCC)    Margerie Fraiser A, MD, [facility] 04/15/2015 10:22 AM

## 2015-04-16 ENCOUNTER — Inpatient Hospital Stay: Payer: Medicare Other

## 2015-04-16 LAB — CBC
HEMATOCRIT: 29.4 % — AB (ref 35.0–47.0)
Hemoglobin: 10.2 g/dL — ABNORMAL LOW (ref 12.0–16.0)
MCH: 34.1 pg — ABNORMAL HIGH (ref 26.0–34.0)
MCHC: 34.6 g/dL (ref 32.0–36.0)
MCV: 98.4 fL (ref 80.0–100.0)
Platelets: 86 10*3/uL — ABNORMAL LOW (ref 150–440)
RBC: 2.98 MIL/uL — ABNORMAL LOW (ref 3.80–5.20)
RDW: 17.4 % — AB (ref 11.5–14.5)
WBC: 5.1 10*3/uL (ref 3.6–11.0)

## 2015-04-16 LAB — PHOSPHORUS: Phosphorus: 4.9 mg/dL — ABNORMAL HIGH (ref 2.5–4.6)

## 2015-04-16 LAB — PREPARE RBC (CROSSMATCH)

## 2015-04-16 MED ORDER — CHLORHEXIDINE GLUCONATE CLOTH 2 % EX PADS
6.0000 | MEDICATED_PAD | Freq: Every day | CUTANEOUS | Status: DC
  Administered 2015-04-17 – 2015-04-19 (×3): 6 via TOPICAL

## 2015-04-16 MED ORDER — SODIUM CHLORIDE 0.9 % IV SOLN
100.0000 mL | INTRAVENOUS | Status: DC | PRN
  Administered 2015-04-17: 08:00:00 via INTRAVENOUS

## 2015-04-16 MED ORDER — LIDOCAINE HCL (PF) 1 % IJ SOLN
5.0000 mL | INTRAMUSCULAR | Status: DC | PRN
  Filled 2015-04-16: qty 5

## 2015-04-16 MED ORDER — LIDOCAINE-PRILOCAINE 2.5-2.5 % EX CREA
1.0000 "application " | TOPICAL_CREAM | CUTANEOUS | Status: DC | PRN
  Filled 2015-04-16: qty 5

## 2015-04-16 MED ORDER — HEPARIN SODIUM (PORCINE) 1000 UNIT/ML DIALYSIS
1000.0000 [IU] | INTRAMUSCULAR | Status: DC | PRN
  Filled 2015-04-16: qty 1

## 2015-04-16 MED ORDER — ALTEPLASE 2 MG IJ SOLR
2.0000 mg | Freq: Once | INTRAMUSCULAR | Status: DC | PRN
  Filled 2015-04-16: qty 2

## 2015-04-16 MED ORDER — SODIUM CHLORIDE 0.9 % IV SOLN: 100.0000 mL | INTRAVENOUS | Status: DC | PRN

## 2015-04-16 MED ORDER — PENTAFLUOROPROP-TETRAFLUOROETH EX AERO
1.0000 "application " | INHALATION_SPRAY | CUTANEOUS | Status: DC | PRN
  Filled 2015-04-16: qty 30

## 2015-04-16 NOTE — Progress Notes (Signed)
Post hd tx 

## 2015-04-16 NOTE — Progress Notes (Signed)
Pre-hd tx 

## 2015-04-16 NOTE — Plan of Care (Signed)
Problem: Activity: Goal: Risk for activity intolerance will decrease Outcome: Not Progressing Pt has generalized weakness. Using bedpan

## 2015-04-16 NOTE — Progress Notes (Signed)
SUBJECTIVE: Feeling much better but Dr. Corene Cornea or Dr. deal has not seen the patient and is asking what the next step.   Filed Vitals:   04/15/15 1949 04/15/15 2109 04/16/15 0208 04/16/15 0531  BP:  159/67  168/77  Pulse:  83  74  Temp:  98 F (36.7 C)  98.1 F (36.7 C)  TempSrc:  Oral  Oral  Resp:  18  18  Height:      Weight:    115 lb 12.8 oz (52.527 kg)  SpO2: 96% 96% 96% 99%    Intake/Output Summary (Last 24 hours) at 04/16/15 0852 Last data filed at 04/16/15 0757  Gross per 24 hour  Intake    240 ml  Output      0 ml  Net    240 ml    LABS: Basic Metabolic Panel: No results for input(s): NA, K, CL, CO2, GLUCOSE, BUN, CREATININE, CALCIUM, MG, PHOS in the last 72 hours. Liver Function Tests: No results for input(s): AST, ALT, ALKPHOS, BILITOT, PROT, ALBUMIN in the last 72 hours. No results for input(s): LIPASE, AMYLASE in the last 72 hours. CBC:  Recent Labs  04/15/15 0955 04/16/15 0546  WBC 4.2 5.1  HGB 9.6* 10.2*  HCT 28.5* 29.4*  MCV 96.3 98.4  PLT 59* 86*   Cardiac Enzymes: No results for input(s): CKTOTAL, CKMB, CKMBINDEX, TROPONINI in the last 72 hours. BNP: Invalid input(s): POCBNP D-Dimer: No results for input(s): DDIMER in the last 72 hours. Hemoglobin A1C: No results for input(s): HGBA1C in the last 72 hours. Fasting Lipid Panel: No results for input(s): CHOL, HDL, LDLCALC, TRIG, CHOLHDL, LDLDIRECT in the last 72 hours. Thyroid Function Tests: No results for input(s): TSH, T4TOTAL, T3FREE, THYROIDAB in the last 72 hours.  Invalid input(s): FREET3 Anemia Panel: No results for input(s): VITAMINB12, FOLATE, FERRITIN, TIBC, IRON, RETICCTPCT in the last 72 hours.   PHYSICAL EXAM General: Well developed, well nourished, in no acute distress HEENT:  Normocephalic and atramatic Neck:  No JVD.  Lungs: Clear bilaterally to auscultation and percussion. Heart: HRRR . Normal S1 and S2 without gallops or murmurs.  Abdomen: Bowel sounds are positive,  abdomen soft and non-tender  Msk:  Back normal, normal gait. Normal strength and tone for age. Extremities: No clubbing, cyanosis or edema.   Neuro: Alert and oriented X 3. Psych:  Good affect, responds appropriately  TELEMETRY: Not on the monitor  ASSESSMENT AND PLAN: Left groin swelling niece to be drained with evaluation for possible source of infection which is pending for Dr. Laurence Compton or Dr. Hinton Lovely to do. Patient had total femoral aneurysm rupture and had grafts placed at [facility]. Patient has normal left reticular systolic function. Evaluation has to be done whether these grafts are infected along with the drainage area being part of an abscess. Cardiac point of view patient is cleared to have this procedure done.  Principal Problem:   Acute pulmonary edema with congestive heart failure (HCC) Active Problems:   Leukocytosis   Malignant essential hypertension   Dysphagia   Elevated troponin   Acute pulmonary edema (HCC)    Jaesean Litzau A, MD, [facility] 04/16/2015 8:52 AM

## 2015-04-16 NOTE — Progress Notes (Signed)
Subjective:  Patient still having some mild shortness of breath. She is due for hemodialysis today. Orders have been prepared. Remains on O2 via nasal cannula.  Objective:  Vital signs in last 24 hours:  Temp:  [97.9 F (36.6 C)-98.1 F (36.7 C)] 98.1 F (36.7 C) (01/17 0531) Pulse Rate:  [74-83] 74 (01/17 0531) Resp:  [18-20] 18 (01/17 0531) BP: (148-171)/(67-81) 168/77 mmHg (01/17 0531) SpO2:  [96 %-99 %] 99 % (01/17 0531) Weight:  [52.527 kg (115 lb 12.8 oz)] 52.527 kg (115 lb 12.8 oz) (01/17 0531)  Weight change: -1.497 kg (-3 lb 4.8 oz) Filed Weights   04/13/15 1450 04/15/15 0559 04/16/15 0531  Weight: 58.7 kg (129 lb 6.6 oz) 54.023 kg (119 lb 1.6 oz) 52.527 kg (115 lb 12.8 oz)    Intake/Output:    Intake/Output Summary (Last 24 hours) at 04/16/15 0944 Last data filed at 04/16/15 0757  Gross per 24 hour  Intake      0 ml  Output      0 ml  Net      0 ml     Physical Exam: General: NAD  HEENT Anicteric, moist oral mucus membranes  Neck supple  Pulm/lungs Clear bilaterally, normal resp effort   CVS/Heart Irregular, no rub  Abdomen:  Soft, NT, scars from previous surgeries  Extremities: No peripheral edema  Neurologic: Alert, follows commands  Skin: No acute rashes  Access: Rt IJ PC       Basic Metabolic Panel:   Recent Labs Lab 04/09/15 1013 04/10/15 0817  NA  --  138  K  --  3.7  CL  --  101  CO2  --  28  GLUCOSE  --  74  BUN  --  27*  CREATININE 5.20* 3.82*  CALCIUM  --  8.3*     CBC:  Recent Labs Lab 04/09/15 1013 04/10/15 0817 04/11/15 0438 04/13/15 0529 04/14/15 1349 04/15/15 0955 04/16/15 0546  WBC 7.5 5.6  --   --   --  4.2 5.1  HGB 10.6* 10.2*  --  9.4* 10.4* 9.6* 10.2*  HCT 31.5* 30.7*  --   --   --  28.5* 29.4*  MCV 99.0 98.0  --   --   --  96.3 98.4  PLT 264 134* 154  --   --  59* 86*      Microbiology:  Recent Results (from the past 720 hour(s))  MRSA PCR Screening     Status: None   Collection Time: 04/09/15   9:45 AM  Result Value Ref Range Status   MRSA by PCR NEGATIVE NEGATIVE Final    Comment:        The GeneXpert MRSA Assay (FDA approved for NASAL specimens only), is one component of a comprehensive MRSA colonization surveillance program. It is not intended to diagnose MRSA infection nor to guide or monitor treatment for MRSA infections.   CULTURE, BLOOD (ROUTINE X 2) w Reflex to PCR ID Panel     Status: None (Preliminary result)   Collection Time: 04/14/15 11:51 AM  Result Value Ref Range Status   Specimen Description BLOOD RIGHT WRIST  Final   Special Requests BOTTLES DRAWN AEROBIC AND ANAEROBIC  10CC  Final   Culture NO GROWTH 2 DAYS  Final   Report Status PENDING  Incomplete  CULTURE, BLOOD (ROUTINE X 2) w Reflex to PCR ID Panel     Status: None (Preliminary result)   Collection Time: 04/14/15 12:02 PM  Result Value  Ref Range Status   Specimen Description BLOOD LEFT WRIST  Final   Special Requests   Final    BOTTLES DRAWN AEROBIC AND ANAEROBIC  AER 10CC ANA 5CC   Culture NO GROWTH 2 DAYS  Final   Report Status PENDING  Incomplete    Coagulation Studies: No results for input(s): LABPROT, INR in the last 72 hours.  Urinalysis: No results for input(s): COLORURINE, LABSPEC, PHURINE, GLUCOSEU, HGBUR, BILIRUBINUR, KETONESUR, PROTEINUR, UROBILINOGEN, NITRITE, LEUKOCYTESUR in the last 72 hours.  Invalid input(s): APPERANCEUR    Imaging: Dg Chest 2 View  04/14/2015  CLINICAL DATA:  Severe shortness of breath and cough for several days. 2 month status post abdominal aortic aneurysm repair. EXAM: CHEST  2 VIEW COMPARISON:  04/10/2015 FINDINGS: Right jugular dual-lumen central venous dialysis catheter remains in place. No pneumothorax visualized. Pulmonary hyperinflation again demonstrated. Small bilateral pleural effusions and bibasilar atelectasis show mild improvement since previous study. Abdominal aortic stent graft again noted. IMPRESSION: Interval improvement in small  bilateral pleural effusions and bibasilar atelectasis. Electronically Signed   By: Earle Gell M.D.   On: 04/14/2015 15:13     Medications:     . amLODipine  5 mg Oral Daily  . antiseptic oral rinse  7 mL Mouth Rinse q12n4p  . atorvastatin  40 mg Oral Daily  . budesonide-formoterol  2 puff Inhalation BID  . chlorhexidine  15 mL Mouth Rinse BID  . docusate sodium  100 mg Oral BID  . epoetin (EPOGEN/PROCRIT) injection  4,000 Units Intravenous Q T,Th,Sa-HD  . famotidine  20 mg Oral Daily  . fluticasone  2 spray Each Nare Daily  . ipratropium-albuterol  3 mL Nebulization Q6H  . irbesartan  75 mg Oral BID  . levothyroxine  88 mcg Oral Daily  . lidocaine  1 patch Transdermal Q24H  . metoprolol  50 mg Oral 3 times per day  . multivitamin  1 tablet Oral Daily  . sodium chloride  1 spray Each Nare 5 X Daily  . sodium chloride  3 mL Intravenous Q12H  . sodium chloride  3 mL Intravenous Q12H   sodium chloride, acetaminophen, chlorpheniramine-HYDROcodone, guaiFENesin-dextromethorphan, magnesium hydroxide, ondansetron **OR** ondansetron (ZOFRAN) IV, ondansetron, sodium chloride  Assessment/ Plan:  65 y.o. female with a PMHX of medical problems of COPD, hypertension, hyperlipidemia, vaginal intraepithelial neoplasia, emergent AAA repair status post rupture on November 18, left to right femoral to femoral bypass , acute renal failure leading to permanent dialysis, significant 3 vessel disease, was admitted on 04/09/2015 with severe shortness of breath after missing dialysis due to weather conditions.   1. End-stage renal disease. [place]. TTS. [facility] nephrology - Patient due for hemodialysis today using right internal jugular PermCath. Orders have been prepared. Continue dialysis on Tuesday, Thursday, Saturday schedule.  2. Acute pulmonary edema, requiring NIPPV Currently on nasal cannula. We will plan for ultrafiltration with dialysis today.  3. Peripheral edema Overall improved.  Ultrafiltration target 1.5-2 kg.  4. Anemia of chronic kidney disease  Continue Epogen 4000 units IV with dialysis.  5. Bilateral fluid collection surrounding the anastomoses of the femoral to femoral bypass. - Vascular surgery evaluation in progress - Will likely need incision and drainage to evaluate microbiology of infection, surgery due on Wednesday.      LOS: 7 Bruchy Mikel 1/17/20179:44 AM

## 2015-04-16 NOTE — Progress Notes (Signed)
[place] at [place] NAME: [name]    MR#:  [id number]  DATE OF BIRTH:  [birth year]  SUBJECTIVE:   Had HD earlier today and tolerated it well.  Remains in mild resp. Distress.  Going to OR tomorrow as per Vascular surgery for fluid drainage post fem-fem bypass. Afebrile, hemodynamically stable.   REVIEW OF SYSTEMS:    Review of Systems  Constitutional: Negative for fever and chills.  HENT: Negative for congestion and tinnitus.   Eyes: Negative for blurred vision and double vision.  Respiratory: Positive for cough and shortness of breath. Negative for wheezing.   Cardiovascular: Negative for chest pain, orthopnea and PND.  Gastrointestinal: Negative for nausea, vomiting, abdominal pain and diarrhea.  Genitourinary: Negative for dysuria and hematuria.  Neurological: Positive for weakness. Negative for dizziness, sensory change and focal weakness.  All other systems reviewed and are negative.   Nutrition: Renal  Tolerating Diet: Yes Tolerating PT: Await Eval.    DRUG ALLERGIES:  No Known Allergies  VITALS:  Blood pressure 146/65, pulse 83, temperature 98.2 F (36.8 C), temperature source Oral, resp. rate 12, height 5\' 4"  (1.626 m), weight 51.2 kg (112 lb 14 oz), last menstrual period 03/30/1988, SpO2 99 %.  PHYSICAL EXAMINATION:   Physical Exam  GENERAL:  65 y.o.-year-old cachectic patient lying in the bed in mild resp. Distress.   EYES: Pupils equal, round, reactive to light and accommodation. No scleral icterus. Extraocular muscles intact.  HEENT: Head atraumatic, normocephalic. Oropharynx and nasopharynx clear.  NECK:  Supple, no jugular venous distention. No thyroid enlargement, no tenderness.  LUNGS: Prolonged inspiratory and expiratory phase. Minimal end expiratory wheezing, bilateral rales. No use of accessory muscles of respiration.  CARDIOVASCULAR: S1, S2 normal. No murmurs, rubs, or gallops.  ABDOMEN:  Soft, nontender, nondistended. Bowel sounds present. No organomegaly or mass.  EXTREMITIES: No cyanosis, clubbing or edema b/l.    NEUROLOGIC: Cranial nerves II through XII are intact. No focal Motor or sensory deficits b/l.  Globally weak PSYCHIATRIC: The patient is alert and oriented x 3. Good affect.  SKIN: No obvious rash, lesion, or ulcer.    LABORATORY PANEL:   CBC  Recent Labs Lab 04/16/15 0546  WBC 5.1  HGB 10.2*  HCT 29.4*  PLT 86*   ------------------------------------------------------------------------------------------------------------------  Chemistries   Recent Labs Lab 04/10/15 0817  NA 138  K 3.7  CL 101  CO2 28  GLUCOSE 74  BUN 27*  CREATININE 3.82*  CALCIUM 8.3*   ------------------------------------------------------------------------------------------------------------------  Cardiac Enzymes  Recent Labs Lab 04/09/15 2249  TROPONINI 0.08*   ------------------------------------------------------------------------------------------------------------------  RADIOLOGY:  No results found.   ASSESSMENT AND PLAN:   65 year old female with past medical history of end-stage renal disease and hemodialysis, hypertension, hyperlipidemia, hypothyroidism, GERD, COPD who presented to the hospital due to shortness of breath and noted to be in CHF with acute pulmonary edema.  #1 acute on chronic hypoxic respiratory failure-due to a combination of diastolic CHF and underlying COPD. -Patient has seemed hemodialysis for the past 4-5 days and has clinically improved. -Continue to wean O2 as tolerated.  #2 malignant essential hypertension-blood pressures have improved. Continue metoprolol, verapamil, low-dose Norvasc.  #3 hypothyroidism-continue Synthroid.  #4 end-stage renal disease on hemodialysis-nephrology is consulted. Continue dialysis on Tuesday Thursday Saturday.  #5 leukocytosis/low-grade fever-suspected to be secondary to the fluid collection  status post femorofemoral bypass. Patient is clinically afebrile and hemodynamically stable. -As per vascular surgery going to the OR tomorrow for  fluid drainage. Patient's blood cultures so far have been negative.  #6 COPD-without acute exacerbation. -Continue Symbicort, DuoNeb nebs as needed.  #7 anemia of chronic disease-secondary to end-stage renal disease. Continue EPO with dialysis.   All the records are reviewed and case discussed with Care Management/Social Workerr. Management plans discussed with the patient, family and they are in agreement.  CODE STATUS: Full  DVT Prophylaxis: Ted's & SCD's.   TOTAL TIME TAKING CARE OF THIS PATIENT: 30 minutes.   POSSIBLE D/C IN 1-2 DAYS, DEPENDING ON CLINICAL CONDITION.   Henreitta Leber M.D on 04/16/2015 at 3:51 PM  Between 7am to 6pm - Pager - [phone]  After 6pm go to [email or web address] - password [email or web address] [facility]  Office  [phone]  CC: Primary care physician; Arnette Norris, MD

## 2015-04-16 NOTE — Progress Notes (Signed)
[facility]  Daily Progress Note   Subjective  - * No surgery found *  She is still complaining of coughing somewhat improved compared to yesterday. She denies fever chills  Objective Filed Vitals:   04/16/15 1300 04/16/15 1326 04/16/15 1330 04/16/15 1419  BP: 137/119 139/66 146/65   Pulse: 94 88 83   Temp:   98.2 F (36.8 C)   TempSrc:   Oral   Resp: 13 13 12    Height:      Weight:   54.5 kg (120 lb 2.4 oz) 51.2 kg (112 lb 14 oz)  SpO2:        Intake/Output Summary (Last 24 hours) at 04/16/15 1827 Last data filed at 04/16/15 1330  Gross per 24 hour  Intake    240 ml  Output   1500 ml  Net  -1260 ml    PULM  Normal effort , no use of accessory muscles CV  No JVD, RRR Abd      No distended, nontender VASC   left groin is draining right groin remains tender with a non-fluctuant mass  Laboratory CBC    Component Value Date/Time   WBC 5.1 04/16/2015 0546   WBC 9.3 07/19/2014 0841   HGB 10.2* 04/16/2015 0546   HGB 10.8* 07/19/2014 0841   HCT 29.4* 04/16/2015 0546   HCT 33.7* 07/19/2014 0841   PLT 86* 04/16/2015 0546   PLT 237 07/19/2014 0841    BMET    Component Value Date/Time   NA 138 04/10/2015 0817   NA 135 07/22/2014 0645   K 3.7 04/10/2015 0817   K 3.7 07/22/2014 0645   CL 101 04/10/2015 0817   CL 112* 07/22/2014 0645   CO2 28 04/10/2015 0817   CO2 18* 07/22/2014 0645   GLUCOSE 74 04/10/2015 0817   GLUCOSE 77 07/22/2014 0645   BUN 27* 04/10/2015 0817   BUN 24* 07/22/2014 0645   CREATININE 3.82* 04/10/2015 0817   CREATININE 1.00 07/22/2014 0645   CALCIUM 8.3* 04/10/2015 0817   CALCIUM 8.3* 07/22/2014 0645   GFRNONAA 11* 04/10/2015 0817   GFRNONAA 59* 07/22/2014 0645   GFRNONAA 58* 05/07/2014 1302   GFRAA 13* 04/10/2015 0817   GFRAA >60 07/22/2014 0645   GFRAA >60 05/07/2014 1302    Assessment/Planning:  Atherosclerotic occlusive disease in association with an abdominal aortic aneurysm. She is status post repair for  rupture of the aneurysm and now with aorta uni-iliac stent and a femorofemoral bypass. At this time there is fluid around the femorofemoral in both right and left groins I will plan for surgery on tomorrow   She is already been typed and crossed, I will obtain a chest x-ray tonight and recheck a CBC and BMP in the morning. Cosandra Plouffe, Dolores Lory  04/16/2015, 6:27 PM

## 2015-04-16 NOTE — Clinical Social Work Note (Signed)
Vascular surgery stating patient will have procedure. Not yet ready for return to [facility]. CSW has updated Helene Kelp at [facility]. Shela Leff MSW,LCSW [phone]

## 2015-04-16 NOTE — Progress Notes (Signed)
Hemodialysis complete

## 2015-04-16 NOTE — Care Management Note (Signed)
Patient is active at [place] on TTS schedule.  Admission records have been sent and I will follow up with additional records at discharge.  [facility] Liaison [phone]

## 2015-04-17 ENCOUNTER — Inpatient Hospital Stay: Payer: Medicare Other | Admitting: Anesthesiology

## 2015-04-17 ENCOUNTER — Inpatient Hospital Stay: Payer: Medicare Other

## 2015-04-17 ENCOUNTER — Encounter
Admission: EM | Disposition: A | Discharge status: Skilled Nursing Facility | Payer: Self-pay | Source: Home / Self Care | Attending: Internal Medicine

## 2015-04-17 HISTORY — PX: CENTRAL VENOUS CATHETER INSERTION: SHX401

## 2015-04-17 HISTORY — PX: FEMORAL-FEMORAL BYPASS GRAFT: SHX936

## 2015-04-17 LAB — BASIC METABOLIC PANEL
ANION GAP: 8 (ref 5–15)
Anion gap: 7 (ref 5–15)
BUN: 22 mg/dL — AB (ref 6–20)
BUN: 25 mg/dL — ABNORMAL HIGH (ref 6–20)
CALCIUM: 8 mg/dL — AB (ref 8.9–10.3)
CO2: 30 mmol/L (ref 22–32)
CO2: 28 mmol/L (ref 22–32)
CREATININE: 2.95 mg/dL — AB (ref 0.44–1.00)
Calcium: 8 mg/dL — ABNORMAL LOW (ref 8.9–10.3)
Chloride: 101 mmol/L (ref 101–111)
Chloride: 100 mmol/L — ABNORMAL LOW (ref 101–111)
Creatinine, Ser: 3.37 mg/dL — ABNORMAL HIGH (ref 0.44–1.00)
GFR calc Af Amer: 18 mL/min — ABNORMAL LOW (ref >60)
GFR, EST AFRICAN AMERICAN: 15 mL/min — AB (ref >60)
GFR, EST NON AFRICAN AMERICAN: 16 mL/min — AB (ref >60)
GFR, EST NON AFRICAN AMERICAN: 13 mL/min — AB (ref >60)
GLUCOSE: 90 mg/dL (ref 65–99)
GLUCOSE: 105 mg/dL — AB (ref 65–99)
Potassium: 3 mmol/L — ABNORMAL LOW (ref 3.5–5.1)
Potassium: 3.6 mmol/L (ref 3.5–5.1)
SODIUM: 138 mmol/L (ref 135–145)
SODIUM: 136 mmol/L (ref 135–145)

## 2015-04-17 LAB — BLOOD GAS, ARTERIAL
Acid-Base Excess: 4.7 mmol/L — ABNORMAL HIGH (ref 0.0–3.0)
BICARBONATE: 29.8 meq/L — AB (ref 21.0–28.0)
FIO2: 32
O2 Saturation: 95.3 %
PATIENT TEMPERATURE: 37
pCO2 arterial: 46 mmHg (ref 32.0–48.0)
pH, Arterial: 7.42 (ref 7.350–7.450)
pO2, Arterial: 76 mmHg — ABNORMAL LOW (ref 83.0–108.0)

## 2015-04-17 LAB — CBC
HCT: 27.7 % — ABNORMAL LOW (ref 35.0–47.0)
Hemoglobin: 9.5 g/dL — ABNORMAL LOW (ref 12.0–16.0)
MCH: 33.3 pg (ref 26.0–34.0)
MCHC: 34.2 g/dL (ref 32.0–36.0)
MCV: 97.6 fL (ref 80.0–100.0)
PLATELETS: 69 10*3/uL — AB (ref 150–440)
RBC: 2.84 MIL/uL — AB (ref 3.80–5.20)
RDW: 17.6 % — AB (ref 11.5–14.5)
WBC: 5.3 10*3/uL (ref 3.6–11.0)

## 2015-04-17 LAB — GLUCOSE, CAPILLARY: GLUCOSE-CAPILLARY: 98 mg/dL (ref 65–99)

## 2015-04-17 LAB — SURGICAL PCR SCREEN
MRSA, PCR: NEGATIVE
Staphylococcus aureus: NEGATIVE

## 2015-04-17 LAB — PARATHYROID HORMONE, INTACT (NO CA): PTH: 45 pg/mL (ref 15–65)

## 2015-04-17 LAB — HEPARIN INDUCED PLATELET AB (HIT ANTIBODY): HEPARIN INDUCED PLT AB: 0.529 {OD_unit} — AB (ref 0.000–0.400)

## 2015-04-17 SURGERY — CREATION, BYPASS, ARTERIAL, FEMORAL TO FEMORAL, USING GRAFT
Anesthesia: General | Site: Neck | Laterality: Left | Wound class: Clean Contaminated

## 2015-04-17 MED ORDER — ONDANSETRON HCL 4 MG/2ML IJ SOLN
INTRAMUSCULAR | Status: DC | PRN
Start: 2015-04-17 — End: 2015-04-17
  Administered 2015-04-17: 4 mg via INTRAVENOUS

## 2015-04-17 MED ORDER — VANCOMYCIN HCL 1000 MG IV SOLR
INTRAVENOUS | Status: DC | PRN
  Administered 2015-04-17: 1 g via TOPICAL

## 2015-04-17 MED ORDER — POTASSIUM CHLORIDE 10 MEQ/50ML IV SOLN
10.0000 meq | INTRAVENOUS | Status: DC | PRN
  Filled 2015-04-17: qty 50

## 2015-04-17 MED ORDER — MORPHINE SULFATE (PF) 2 MG/ML IV SOLN
2.0000 mg | INTRAVENOUS | Status: DC | PRN
  Administered 2015-04-17 – 2015-04-18 (×3): 2 mg via INTRAVENOUS
  Administered 2015-04-18: 18:00:00 4 mg via INTRAVENOUS
  Administered 2015-04-22: 2 mg via INTRAVENOUS
  Filled 2015-04-17: qty 1
  Filled 2015-04-17: qty 2
  Filled 2015-04-17 (×3): qty 1

## 2015-04-17 MED ORDER — DEXAMETHASONE SODIUM PHOSPHATE 10 MG/ML IJ SOLN: 8.0000 mg | Freq: Once | INTRAMUSCULAR | Status: DC | PRN

## 2015-04-17 MED ORDER — FENTANYL CITRATE (PF) 250 MCG/5ML IJ SOLN
INTRAMUSCULAR | Status: DC | PRN
  Administered 2015-04-17 (×2): 25 ug via INTRAVENOUS
  Administered 2015-04-17: 50 ug via INTRAVENOUS

## 2015-04-17 MED ORDER — OXYCODONE-ACETAMINOPHEN 5-325 MG PO TABS
1.0000 | ORAL_TABLET | ORAL | Status: DC | PRN
  Administered 2015-04-18 – 2015-04-21 (×6): 1 via ORAL
  Filled 2015-04-17 (×6): qty 1

## 2015-04-17 MED ORDER — DEXAMETHASONE SODIUM PHOSPHATE 10 MG/ML IJ SOLN
INTRAMUSCULAR | Status: DC | PRN
  Administered 2015-04-17: 10 mg via INTRAVENOUS

## 2015-04-17 MED ORDER — CEFAZOLIN SODIUM 1-5 GM-% IV SOLN
INTRAVENOUS | Status: DC | PRN
  Administered 2015-04-17: 1 g via INTRAVENOUS

## 2015-04-17 MED ORDER — PHENYLEPHRINE HCL 10 MG/ML IJ SOLN
INTRAMUSCULAR | Status: DC | PRN
  Administered 2015-04-17 (×2): 100 ug via INTRAVENOUS

## 2015-04-17 MED ORDER — LABETALOL HCL 5 MG/ML IV SOLN
INTRAVENOUS | Status: AC
  Administered 2015-04-17: 10 mg via INTRAVENOUS
  Filled 2015-04-17: qty 4

## 2015-04-17 MED ORDER — SODIUM CHLORIDE 0.9 % IJ SOLN
INTRAMUSCULAR | Status: AC
  Filled 2015-04-17: qty 6

## 2015-04-17 MED ORDER — POTASSIUM CHLORIDE 10 MEQ/100ML IV SOLN
INTRAVENOUS | Status: DC | PRN
  Administered 2015-04-17: 10 meq via INTRAVENOUS

## 2015-04-17 MED ORDER — HYDROMORPHONE HCL 1 MG/ML IJ SOLN
INTRAMUSCULAR | Status: AC
  Administered 2015-04-17: 0.25 mg via INTRAVENOUS
  Filled 2015-04-17: qty 1

## 2015-04-17 MED ORDER — ROCURONIUM BROMIDE 100 MG/10ML IV SOLN
INTRAVENOUS | Status: DC | PRN
  Administered 2015-04-17: 30 mg via INTRAVENOUS
  Administered 2015-04-17: 10 mg via INTRAVENOUS

## 2015-04-17 MED ORDER — LABETALOL HCL 5 MG/ML IV SOLN
10.0000 mg | INTRAVENOUS | Status: DC | PRN
  Administered 2015-04-17 (×3): 10 mg via INTRAVENOUS

## 2015-04-17 MED ORDER — HYDROMORPHONE HCL 1 MG/ML IJ SOLN
0.2500 mg | INTRAMUSCULAR | Status: DC | PRN
  Administered 2015-04-17: 0.25 mg via INTRAVENOUS

## 2015-04-17 MED ORDER — LIDOCAINE HCL (CARDIAC) 20 MG/ML IV SOLN
INTRAVENOUS | Status: DC | PRN
  Administered 2015-04-17: 20 mg via INTRAVENOUS

## 2015-04-17 MED ORDER — EPHEDRINE SULFATE 50 MG/ML IJ SOLN
INTRAMUSCULAR | Status: DC | PRN
  Administered 2015-04-17 (×2): 10 mg via INTRAVENOUS

## 2015-04-17 MED ORDER — NEOSTIGMINE METHYLSULFATE 10 MG/10ML IV SOLN
INTRAVENOUS | Status: DC | PRN
  Administered 2015-04-17: 4 mg via INTRAVENOUS

## 2015-04-17 MED ORDER — SODIUM CHLORIDE 0.9 % IJ SOLN
INTRAMUSCULAR | Status: AC
  Filled 2015-04-17: qty 3

## 2015-04-17 MED ORDER — SODIUM CHLORIDE 0.9 % IV SOLN: 10000.0000 ug | INTRAVENOUS | Status: DC | PRN

## 2015-04-17 MED ORDER — SODIUM CHLORIDE 0.9 % IV SOLN: INTRAVENOUS | Status: DC | PRN

## 2015-04-17 MED ORDER — PROPOFOL 10 MG/ML IV BOLUS
INTRAVENOUS | Status: DC | PRN
  Administered 2015-04-17: 100 mg via INTRAVENOUS

## 2015-04-17 MED ORDER — MIDAZOLAM HCL 5 MG/5ML IJ SOLN
INTRAMUSCULAR | Status: DC | PRN
  Administered 2015-04-17: 2 mg via INTRAVENOUS

## 2015-04-17 MED ORDER — GLYCOPYRROLATE 0.2 MG/ML IJ SOLN
INTRAMUSCULAR | Status: DC | PRN
  Administered 2015-04-17: .6 mg via INTRAVENOUS

## 2015-04-17 SURGICAL SUPPLY — 74 items
BAG COUNTER SPONGE EZ (MISCELLANEOUS) ×3 IMPLANT
BAG DECANTER FOR FLEXI CONT (MISCELLANEOUS) ×4 IMPLANT
BAG SPNG 4X4 CLR HAZ (MISCELLANEOUS) ×2
BLADE SURG 15 STRL LF DISP TIS (BLADE) ×2 IMPLANT
BLADE SURG 15 STRL SS (BLADE) ×4
BLADE SURG SZ11 CARB STEEL (BLADE) ×4 IMPLANT
BOOT SUTURE AID YELLOW STND (SUTURE) ×10 IMPLANT
BRUSH SCRUB 4% CHG (MISCELLANEOUS) ×4 IMPLANT
CANISTER SUCT 1200ML W/VALVE (MISCELLANEOUS) ×4 IMPLANT
CATH TRAY 16F METER LATEX (MISCELLANEOUS) ×2 IMPLANT
CELL SAVER ADDITIONAL TIME PER (MISCELLANEOUS) ×600
CELL SAVER AUTOCOAGULATE (MISCELLANEOUS) ×4
CELL SAVER COLL SVCS (MISCELLANEOUS) ×4
COUNTER SPONGE BAG EZ (MISCELLANEOUS) ×1
DRAPE INCISE IOBAN 66X45 STRL (DRAPES) ×4 IMPLANT
DRESSING SURGICEL FIBRLLR 1X2 (HEMOSTASIS) ×2 IMPLANT
DRSG SURGICEL FIBRILLAR 1X2 (HEMOSTASIS)
DRSG VAC ATS MED SENSATRAC (GAUZE/BANDAGES/DRESSINGS) ×2 IMPLANT
DURAPREP 26ML APPLICATOR (WOUND CARE) ×6 IMPLANT
ELECT CAUTERY BLADE 6.4 (BLADE) ×8 IMPLANT
ELECT REM PT RETURN 9FT ADLT (ELECTROSURGICAL) ×4
ELECTRODE REM PT RTRN 9FT ADLT (ELECTROSURGICAL) ×2 IMPLANT
EVICEL 5ML SEALNT HUMAN B (Miscellaneous) ×4 IMPLANT
GEL ULTRASOUND 20GR AQUASONIC (MISCELLANEOUS) IMPLANT
GLOVE BIO SURGEON STRL SZ7 (GLOVE) ×10 IMPLANT
GLOVE SURG SYN 8.0 (GLOVE) ×8 IMPLANT
GLOVE SURG SYN 8.0 PF PI (GLOVE) ×2 IMPLANT
GOWN STRL REUS W/ TWL LRG LVL3 (GOWN DISPOSABLE) ×2 IMPLANT
GOWN STRL REUS W/ TWL XL LVL3 (GOWN DISPOSABLE) ×4 IMPLANT
GOWN STRL REUS W/TWL LRG LVL3 (GOWN DISPOSABLE) ×8
GOWN STRL REUS W/TWL XL LVL3 (GOWN DISPOSABLE) ×8
IV NS 500ML (IV SOLUTION) ×4
IV NS 500ML BAXH (IV SOLUTION) ×2 IMPLANT
KIT CATH CVC 3 LUMEN 7FR 8IN (MISCELLANEOUS) ×2 IMPLANT
KIT RM TURNOVER STRD PROC AR (KITS) ×4 IMPLANT
LABEL OR SOLS (LABEL) ×4 IMPLANT
LIQUID BAND (GAUZE/BANDAGES/DRESSINGS) ×4 IMPLANT
LOOP RED MAXI  1X406MM (MISCELLANEOUS) ×4
LOOP VESSEL MAXI 1X406 RED (MISCELLANEOUS) ×8 IMPLANT
LOOP VESSEL MINI 0.8X406 BLUE (MISCELLANEOUS) ×4 IMPLANT
LOOPS BLUE MINI 0.8X406MM (MISCELLANEOUS) ×4
NDL FILTER BLUNT 18X1 1/2 (NEEDLE) ×2 IMPLANT
NEEDLE FILTER BLUNT 18X 1/2SAF (NEEDLE) ×2
NEEDLE FILTER BLUNT 18X1 1/2 (NEEDLE) ×2 IMPLANT
NS IRRIG 500ML POUR BTL (IV SOLUTION) ×4 IMPLANT
PACK BASIN MAJOR ARMC (MISCELLANEOUS) ×4 IMPLANT
PACK UNIVERSAL (MISCELLANEOUS) ×4 IMPLANT
PENCIL ELECTRO HAND CTR (MISCELLANEOUS) ×4 IMPLANT
SAVE CELL AUTOCOAG (MISCELLANEOUS) IMPLANT
SAVER CELL COLL SVCS (MISCELLANEOUS) IMPLANT
SPONGE LAP 18X18 5 PK (GAUZE/BANDAGES/DRESSINGS) IMPLANT
SPONGE XRAY 4X4 16PLY STRL (MISCELLANEOUS) IMPLANT
SUT GTX CV-5 TTC13 DBL (SUTURE) IMPLANT
SUT MNCRL+ 5-0 UNDYED PC-3 (SUTURE) ×4 IMPLANT
SUT MONOCRYL 5-0 (SUTURE) ×4
SUT PROLENE 5 0 RB 1 DA (SUTURE) ×16 IMPLANT
SUT PROLENE 6 0 BV (SUTURE) ×24 IMPLANT
SUT SILK 2 0 (SUTURE) ×4
SUT SILK 2 0 SH (SUTURE) ×4 IMPLANT
SUT SILK 2-0 18XBRD TIE 12 (SUTURE) ×2 IMPLANT
SUT SILK 3 0 (SUTURE) ×4
SUT SILK 3-0 18XBRD TIE 12 (SUTURE) ×2 IMPLANT
SUT SILK 4 0 (SUTURE) ×4
SUT SILK 4-0 18XBRD TIE 12 (SUTURE) ×2 IMPLANT
SUT VIC AB 2-0 CT1 (SUTURE) ×8 IMPLANT
SUT VIC AB 3-0 SH 27 (SUTURE) ×12
SUT VIC AB 3-0 SH 27X BRD (SUTURE) ×4 IMPLANT
SUT VICRYL+ 3-0 36IN CT-1 (SUTURE) ×10 IMPLANT
SYR 20CC LL (SYRINGE) ×4 IMPLANT
SYR 3ML LL SCALE MARK (SYRINGE) ×4 IMPLANT
SYR BULB IRRIG 60ML STRL (SYRINGE) ×4 IMPLANT
TAPE UMBIL 1/8X18 RADIOPA (MISCELLANEOUS) ×4 IMPLANT
TIME ADDITIONAL PER CELL SAVER (MISCELLANEOUS) IMPLANT
WND VAC CANISTER 500ML (MISCELLANEOUS) ×2 IMPLANT

## 2015-04-17 NOTE — Op Note (Signed)
[facility]   OPERATIVE NOTE  DATE:  04/17/2015  PRE-OPERATIVE DIAGNOSIS: Large seromas in both groin status post femoral to femoral bypass and aortic uni-iliac stent graft  POST-OPERATIVE DIAGNOSIS: same  PROCEDURE: 1.   Redo groin explorations bilateral femoral arteries 2.   Irrigation and drainage of seromas bilateral groin incisions 3.   Rotation of sartorius flaps and bilateral groins to cover bypass graft over each femoral artery anastomosis 4.   Bilateral VAC dressing placement  SURGEON: Estoria Geary and Dr. Hortencia Pilar, MD, co-surgeons  ASSISTANT(S): none  ANESTHESIA: Gen.  ESTIMATED BLOOD LOSS: 100 cc  FINDING(S): 1.  Clear fluid in both groins with negative Gram stain. Culture pending  SPECIMEN(S):  Tissue from both groins overlying the graft as well as Gram stain and culture from the right groin wound  INDICATIONS:   [name] is a 65 y.o. female who presents with large symptomatic seromas in both femoral incisions status post femoral to femoral bypass and an aortic uni iliac stent graft. There was concern for infection. We were prepared to remove the prosthetic graft if infection was found at the time of surgery. Coverage of the prosthetic portions of the graft anastomosis were also going to be necessary.  DESCRIPTION: After obtaining full informed written consent, the patient was brought back to the operating room and placed supine upon the operating table.  The patient received IV antibiotics prior to induction.  After obtaining adequate anesthesia, the patient was prepped and draped in the standard fashion. Vertical incisions were made overlying both femoral artery anastomoses. This was a redo exposure and somewhat tedious. The right incision was the larger of the 2 and a culture was performed of clear seroma fluid that was identified at the time of exploration. A moderate to large seroma was seen on the right and a moderate seroma was  seen on the left. The seroma cavity was debrided bilaterally and tissue was sent for specimen. A stat Gram stain was performed which showed no organisms or clear infection of the seromas. The grafts were well incorporated and there did not appear to be any obvious clinical infection either. Given the lack of clear infection and the morbidity associated with removal of her existing graft, we elected to cover the grafts with sartorius flaps on each side. The left circumflex artery is flap was performed first and this was dissected out to its insertion on the iliac crest. This was a small muscle but did appear viable. Care was taken to avoid injury to the vascular structures. It was taken down from the iliac crest at its insertion and then rotated over the exposed portion of the graft. 4 2-0 Vicryl sutures were used to tack down the sartorius flap on the left. We then turned our attention to the right groin. Similarly, the sartorius muscle was dissected out to its insertion to the iliac crest. The right side was a little more robust and care was again taken to avoid injury to the vascular structures feeding the flap. It was taken off its insertion of the iliac crest and rotated over the femoral anastomosis and exposed graft. Again, a total of 4 sutures were used to tack down the sartorius flap and 2-0 Vicryl sutures were selected on the right as well. The wounds were then irrigated. Surgicel and muscle topical hemostatic agents were placed and hemostasis was complete. The groin incisions were closed with a layer of 2-0 Vicryl and then 2 layers of 3-0 Vicryl. The  skin was left open with VAC dressing was cut to fit the wound on each side. An occlusive seal was created with strips of Ioban and connected to suction tubing with a good seal obtained. The patient was then awakened from anesthesia and taken to the recovery room in stable condition having tolerated the procedure well.  COMPLICATIONS: None  CONDITION:  Stable  [name]  04/17/2015, 12:25 PM

## 2015-04-17 NOTE — Progress Notes (Signed)
Dr.Schnire informed of pt art b/p [id number] systolic (art line zeroed and good wave form) and cuff pressure of 152/76.  No new orders will continue to monitor.

## 2015-04-17 NOTE — Progress Notes (Signed)
Dr. Delana Meyer in to see patient and I showed him the drainage leaking around right femoral wound vac which is functioning well. Black foam is compressed with suction at 125 mm HG

## 2015-04-17 NOTE — Anesthesia Procedure Notes (Signed)
Procedure Name: Intubation Date/Time: 04/17/2015 7:48 AM Performed by: Delaney Meigs Pre-anesthesia Checklist: Patient identified, Emergency Drugs available, Suction available, Patient being monitored and Timeout performed Patient Re-evaluated:Patient Re-evaluated prior to inductionOxygen Delivery Method: Circle system utilized Preoxygenation: Pre-oxygenation with 100% oxygen Intubation Type: IV induction Ventilation: Mask ventilation without difficulty Laryngoscope Size: Mac and 3 Grade View: Grade I Tube type: Oral Tube size: 6.5 mm Number of attempts: 1 Airway Equipment and Method: Stylet Placement Confirmation: ETT inserted through vocal cords under direct vision,  positive ETCO2 and breath sounds checked- equal and bilateral Secured at: 21 cm Tube secured with: Tape Dental Injury: Teeth and Oropharynx as per pre-operative assessment  Comments: Smooth placement

## 2015-04-17 NOTE — Op Note (Signed)
OPERATIVE NOTE   PROCEDURE: 1. Insertion of triple-lumen central venous catheter left IJ approach.  PRE-OPERATIVE DIAGNOSIS: Seroma complicating surgical procedure; complication vascular device  POST-OPERATIVE DIAGNOSIS: Same  SURGEON: Katha Cabal M.D.  ANESTHESIA: 1% lidocaine local infiltration  ESTIMATED BLOOD LOSS: Minimal cc  INDICATIONS:   [name] is a 65 y.o. female who presents with large fluid collection surrounding her femoral-femoral bypass. She is undergoing drainage of the seromas with possible revision/replacement of her bypass and therefore requires adequate IV access.  DESCRIPTION: After obtaining full informed written consent, the patient was positioned supine. The left neck was prepped and draped in a sterile fashion. Ultrasound was placed in a sterile sleeve. Ultrasound was utilized to identify the left internal jugular vein which is noted to be echolucent and compressible indicating patency. Images recorded for the permanent record. Under real-time visualization a Seldinger needle is inserted into the vein and the guidewires advanced without difficulty. Small counterincision was made at the wire insertion site. Dilators passed over the wire and the triple-lumen catheter is fed without difficulty.  All 3 lm aspirate and flush easily and are packed with heparin saline. Catheter secured to the skin of the left neck with 2-0 silk. A sterile dressing is applied with Biopatch.  COMPLICATIONS: None  CONDITION: Unchanged  Katha Cabal, M.D. [facility]. Office:  [phone]   04/17/2015, 11:51 AM

## 2015-04-17 NOTE — Transfer of Care (Signed)
Immediate Anesthesia Transfer of Care Note  Patient: [name]  Procedure(s) Performed: Procedure(s): BYPASS GRAFT FEMORAL-FEMORAL ARTERY/  REDO FEM-FEM (Bilateral) INSERTION CENTRAL LINE ADULT (Left)  Patient Location: PACU  Anesthesia Type:General  Level of Consciousness: awake, alert  and oriented  Airway & Oxygen Therapy: Patient Spontanous Breathing and Patient connected to face mask oxygen  Post-op Assessment: Report given to RN and Post -op Vital signs reviewed and unstable, Anesthesiologist notified  Post vital signs: Reviewed and stable  Last Vitals: saturation 100% at 11:28 Filed Vitals:   04/16/15 2332 04/17/15 0538  BP: 170/73 164/78  Pulse: 87 80  Temp:  36.8 C  Resp:      Complications: No apparent anesthesia complications

## 2015-04-17 NOTE — Op Note (Signed)
OPERATIVE NOTE   PROCEDURE: 1.  Incision and drainage with excisional debridement right groin seroma 2.  Rotation of sartorius muscle flap for coverage of bypass graft right side 3.  Incision and drainage with excisional debridement left groin seroma 4.  Rotation of sartorius muscle flap for coverage of bypass graft left side  PRE-OPERATIVE DIAGNOSIS: Complication of vascular device; seroma complicating a procedure; sequela of ruptured abdominal aortic aneurysm; end stage renal disease currently on hemodialysis  POST-OPERATIVE DIAGNOSIS: Same  CO-SURGEONS: Schnier, Carmelina Noun, Erskine Squibb  ASSISTANT(S): Mr. Sung Amabile  ANESTHESIA: general  ESTIMATED BLOOD LOSS: 200 cc  FINDING(S): Bilateral seromas with clear straw-colored fluid stat Gram stain rare white cell no evidence of organisms present  SPECIMEN(S):  Debrided tissue for permanent section; fluid from seroma which was sent to the lab for stat Gram stain  INDICATIONS:   [name] is a 65 y.o. female who presents with multiple medical problems as a direct result from repair of her ruptured abdominal aortic aneurysm. CT scan as well as physical examination demonstrated complex fluid filled masses in both groins. She is now undergoing operative intervention with the intention of salvaging or replacing the femoral-femoral bypass. The risks and benefits of been reviewed in detail with the patient as well as her son all questions have been answered she agrees to proceed with surgery  DESCRIPTION: After full informed written consent was obtained from the patient, the patient was brought back to the operating room and placed supine upon the operating table.  Prior to induction, the patient received IV antibiotics.   After obtaining adequate anesthesia, the patient was then prepped and draped in the standard fashion for a  femoral-femoral bypass. Prior to beginning this portion of the surgery a central line was placed and  this will be dictated as a separate note.  With myself working on the patient's right side and Dr. Lucky Cowboy working on the left side vertical incisions were made in the groins with a 15 blade scalpel. Using Bovie cautery the dissection was carried down through the soft tissues to expose the seroma capsule. The right side was entered first and the fluid was evacuated with the Yankauer sucker and the entire canister was then transported down to microbiology for Gram stain. Approximate 200 cc of fluid was present. Fluid was odorless clear and slightly straw-colored. Once this had been achieved the left seroma was also entered in a similar fashion.  Having evacuated both seromas again myself working on the right side Dr. Lucky Cowboy on the left using 15 blade scalpel and Bovie cautery the entire capsule was debrided. Hemostasis was obtained with 3-0 Vicryl suture as well as Bovie cautery as needed. On inspection there are short segments of the graft (proximally 10-15 mm in length) which are exposed within the seroma capsule. The graft itself is very well incorporated as it courses across the abdomen furthermore both suture lines are well incorporated.  At this time the Gram stain was reported back only a rare white cell was seen no organisms were identified. Given the appearance of the graft as well as the Gram stain was reported I do not believe that the entire graft should be replaced given the patient's overall condition I would manage this conservatively.  Attention is then turned to creating sartorius muscle flaps to allow for coverage over the graft. Working together first on the left the sartorius is identified and then dissected circumferentially for a distance of approximately 15 cm. It  is then taken down off of its origin on the iliac crest and rotated to cover the exposed portion of the femoral-femoral graft. It is tacked in place with multiple interrupted 3-0 Vicryl sutures. The bed of the sartorius is then  inspected for hemostasis.   Working together next on the right the sartorius is identified and then dissected circumferentially for a distance of approximately 15 cm. It is then taken down off of its origin on the iliac crest and rotated to cover the exposed portion of the femoral-femoral graft. It is tacked in place with multiple interrupted 3-0 Vicryl sutures. The bed of the sartorius is then inspected for hemostasis.  Both wounds were then irrigated with sterile saline. Fibular and EvaSeal was then placed in the beds of both wounds vancomycin powder was also sprinkled into the wound the wounds were then closed in layers reapproximating the femoral sheath using 2-0 Vicryl and closing the subcutaneous cutaneous tissues using 3-0 Vicryl. VAC dressings were applied to both incisions.    The patient tolerated this procedure well.   COMPLICATIONS: None  CONDITION: [name]  Office: [phone]   04/17/2015, 11:10 AM

## 2015-04-17 NOTE — Progress Notes (Signed)
PT Cancellation Note  Patient Details Name: [name] MRN: [id number] DOB: 09/29/1950   Cancelled Treatment:    Reason Eval/Treat Not Completed: Patient at procedure or test/unavailable Pt out of room for vascular surgery under general anesthesia.  Pt will need new orders when she is medically ready.  Will d/c PT orders at this time.  Wayne Both, PT, DPT [phone]  Kreg Shropshire 04/17/2015, 12:45 PM

## 2015-04-17 NOTE — Anesthesia Postprocedure Evaluation (Signed)
Anesthesia Post Note  Patient: [name]  Procedure(s) Performed: Procedure(s) (LRB): BYPASS GRAFT FEMORAL-FEMORAL ARTERY/  REDO FEM-FEM (Bilateral) INSERTION CENTRAL LINE ADULT (Left)  Patient location during evaluation: PACU Anesthesia Type: General Level of consciousness: awake Pain management: pain level controlled Vital Signs Assessment: post-procedure vital signs reviewed and stable Respiratory status: spontaneous breathing Cardiovascular status: blood pressure returned to baseline Postop Assessment: no headache Anesthetic complications: no    Last Vitals:  Filed Vitals:   04/17/15 1145 04/17/15 1200  BP: 140/75 127/68  Pulse: 77 73  Temp:    Resp: 17 16    Last Pain:  Filed Vitals:   04/17/15 1219  PainSc: 0-No pain                 Dominiqua Cooner M

## 2015-04-17 NOTE — Anesthesia Preprocedure Evaluation (Signed)
Anesthesia Evaluation  Patient identified by MRN, date of birth, ID band Patient awake    Reviewed: Allergy & Precautions, NPO status , Patient's Chart, lab work & pertinent test results  Airway Mallampati: I  TM Distance: >3 FB Neck ROM: Full    Dental  (+) Teeth Intact   Pulmonary COPD,  COPD inhaler and oxygen dependent, former smoker,     + decreased breath sounds      Cardiovascular Exercise Tolerance: Poor hypertension, Pt. on medications and Pt. on home beta blockers + Peripheral Vascular Disease   Rhythm:Regular Rate:Normal     Neuro/Psych    GI/Hepatic GERD  Medicated and Controlled,  Endo/Other  Hypothyroidism   Renal/GU Renal InsufficiencyRenal disease     Musculoskeletal   Abdominal (+)  Abdomen: soft.    Peds  Hematology  (+) anemia , Hb 9.5, plts 69.   Anesthesia Other Findings   Reproductive/Obstetrics                             Anesthesia Physical Anesthesia Plan  ASA: IV  Anesthesia Plan: General   Post-op Pain Management:    Induction: Intravenous  Airway Management Planned: Oral ETT  Additional Equipment: Arterial line  Intra-op Plan:   Post-operative Plan: Extubation in OR  Informed Consent: I have reviewed the patients History and Physical, chart, labs and discussed the procedure including the risks, benefits and alternatives for the proposed anesthesia with the patient or authorized representative who has indicated his/her understanding and acceptance.   Dental advisory given  Plan Discussed with: CRNA and Surgeon  Anesthesia Plan Comments: (Low Hb and pltlts--on oxygen. Hope to be able to extubate at the end of the case. Troponins have been elevated.)        Anesthesia Quick Evaluation

## 2015-04-17 NOTE — Progress Notes (Signed)
[facility] NAME: [name]    MR#:  [id number]  DATE OF BIRTH:  Aug 28, 1950  SUBJECTIVE:   S/p drainage of b/l Drainage of b/l Groin Seroma's.  Seen post-operatively and still a bit lethargic.  No other acute complaints.   REVIEW OF SYSTEMS:    Review of Systems  Constitutional: Negative for fever and chills.  HENT: Negative for congestion and tinnitus.   Eyes: Negative for blurred vision and double vision.  Respiratory: Positive for cough and shortness of breath. Negative for wheezing.   Cardiovascular: Negative for chest pain, orthopnea and PND.  Gastrointestinal: Negative for nausea, vomiting, abdominal pain and diarrhea.  Genitourinary: Negative for dysuria and hematuria.  Neurological: Positive for weakness. Negative for dizziness, sensory change and focal weakness.  All other systems reviewed and are negative.   Nutrition: Renal  Tolerating Diet: Yes Tolerating PT: Await Eval.    DRUG ALLERGIES:   Allergies  Allergen Reactions  . 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 rate 14, height 5\' 4"  (1.626 m), weight 51.2 kg (112 lb 14 oz), last menstrual period 03/30/1988, SpO2 98 %.  PHYSICAL EXAMINATION:   Physical Exam  GENERAL:  65 y.o.-year-old cachectic patient lying in the bed in mild resp. Distress.   EYES: Pupils equal, round, reactive to light and accommodation. No scleral icterus. Extraocular muscles intact.  HEENT: Head atraumatic, normocephalic. Oropharynx and nasopharynx clear.  NECK:  Supple, no jugular venous distention. No thyroid enlargement, no tenderness.  LUNGS: Prolonged inspiratory and expiratory phase. Minimal end expiratory wheezing. No use of accessory muscles of respiration.  CARDIOVASCULAR: S1, S2 normal. No murmurs, rubs, or gallops.  ABDOMEN: Soft,  nontender, nondistended. Bowel sounds present. No organomegaly or mass. + lower abdominal dressing from surgery today w/ wound vac in place.  EXTREMITIES: No cyanosis, clubbing or edema b/l.    NEUROLOGIC: Cranial nerves II through XII are intact. No focal Motor or sensory deficits b/l.  Globally weak PSYCHIATRIC: The patient is alert and oriented x 3. Good affect.  SKIN: No obvious rash, lesion, or ulcer.    LABORATORY PANEL:   CBC  Recent Labs Lab 04/17/15 0529  WBC 5.3  HGB 9.5*  HCT 27.7*  PLT 69*   ------------------------------------------------------------------------------------------------------------------  Chemistries   Recent Labs Lab 04/17/15 0529  NA 138  K 3.0*  CL 101  CO2 30  GLUCOSE 90  BUN 22*  CREATININE 2.95*  CALCIUM 8.0*   ------------------------------------------------------------------------------------------------------------------  Cardiac Enzymes No results for input(s): TROPONINI in the last 168 hours. ------------------------------------------------------------------------------------------------------------------  RADIOLOGY:  Dg Chest 1 View  04/16/2015  CLINICAL DATA:  Cough and hypertension EXAM: CHEST 1 VIEW COMPARISON:  April 14, 2015 FINDINGS: There is a fairly small but present pleural effusion on the right with bibasilar consolidation. Lungs elsewhere clear. Heart is upper normal in size with pulmonary vascularity within normal limits. No adenopathy. Central catheter tip is in the superior vena cava. No pneumothorax. There is a stent in the upper abdominal aortic region. IMPRESSION: Persistent right pleural effusion with bibasilar airspace consolidation. No new opacity. No change in cardiac silhouette. Electronically Signed   By: Lowella Grip III M.D.   On: 04/16/2015 19:06   Port Chest Xray - Line Placement  04/17/2015  CLINICAL DATA:  65 year old female central line placement. Initial encounter. EXAM: PORTABLE CHEST  1 VIEW  COMPARISON:  04/16/2015 and earlier. FINDINGS: Portable AP upright view at 1150 hours. Left IJ approach central venous catheter has been placed. Tip projects at the lower SVC level 1.5 vertebral bodies below the carina. This is just below the tips of the preexisting right IJ approach dual lumen catheter. No pneumothorax. Veiling opacity at both lung bases in keeping with pleural effusions. Retrocardiac atelectasis. Stable cardiac size and mediastinal contours. No acute pulmonary edema. Partially visible abdominal aortic endograft. IMPRESSION: 1. Left IJ central line placed, tip at the lower SVC level. No pneumothorax. 2. Otherwise stable chest with bilateral pleural effusions and lower lobe collapse. Electronically Signed   By: Genevie Ann M.D.   On: 04/17/2015 12:05     ASSESSMENT AND PLAN:   65 year old female with past medical history of end-stage renal disease and hemodialysis, hypertension, hyperlipidemia, hypothyroidism, GERD, COPD who presented to the hospital due to shortness of breath and noted to be in CHF with acute pulmonary edema.  #1 acute on chronic hypoxic respiratory failure-due to a combination of diastolic CHF and underlying COPD. -Patient has had hemodialysis for the past 4-5 days and has clinically improved. -Continue to wean O2 as tolerated.  #2 malignant essential hypertension-blood pressures have improved. Continue metoprolol, verapamil, Norvasc.    #3 hypothyroidism-continue Synthroid.  #4 end-stage renal disease on hemodialysis-nephrology is consulted. Continue dialysis on Tuesday Thursday Saturday.  #5 leukocytosis/low-grade fever-suspected to be secondary to the fluid collection status post femorofemoral bypass. Patient is clinically afebrile and hemodynamically stable. - s/p b/l I & D of Groin Seroma's.  Will follow fluid culture. Cont. Further wound care as per Vascular surgery.   #6 COPD-without acute exacerbation. -Continue Symbicort, DuoNeb nebs as needed.  #7  anemia of chronic disease-secondary to end-stage renal disease. Continue EPO with dialysis.   All the records are reviewed and case discussed with Care Management/Social Workerr. Management plans discussed with the patient, family and they are in agreement.  CODE STATUS: Full  DVT Prophylaxis: Ted's & SCD's.   TOTAL TIME TAKING CARE OF THIS PATIENT: 25 minutes.   POSSIBLE D/C IN 2-3 DAYS, DEPENDING ON CLINICAL CONDITION.   Henreitta Leber M.D on 04/17/2015 at 4:09 PM  Between 7am to 6pm - Pager - [phone]  After 6pm go to [email or web address] - password [email or web address] [facility]  Office  [phone]  CC: Primary care physician; Arnette Norris, MD

## 2015-04-18 ENCOUNTER — Encounter: Payer: Self-pay | Admitting: Vascular Surgery

## 2015-04-18 LAB — PREPARE PLATELET PHERESIS
UNIT DIVISION: 0
Unit division: 0

## 2015-04-18 LAB — TYPE AND SCREEN
ABO/RH(D): B POS
ANTIBODY SCREEN: NEGATIVE
UNIT DIVISION: 0
Unit division: 0

## 2015-04-18 LAB — CBC
HEMATOCRIT: 25.2 % — AB (ref 35.0–47.0)
Hemoglobin: 8.5 g/dL — ABNORMAL LOW (ref 12.0–16.0)
MCH: 32.5 pg (ref 26.0–34.0)
MCHC: 33.6 g/dL (ref 32.0–36.0)
MCV: 96.6 fL (ref 80.0–100.0)
PLATELETS: 174 10*3/uL (ref 150–440)
RBC: 2.61 MIL/uL — ABNORMAL LOW (ref 3.80–5.20)
RDW: 17.2 % — AB (ref 11.5–14.5)
WBC: 6.6 10*3/uL (ref 3.6–11.0)

## 2015-04-18 LAB — SURGICAL PATHOLOGY

## 2015-04-18 NOTE — Progress Notes (Signed)
Subjective:  Patient had groin reexploration yesterday. Bilateral seromas were drained. Due for hemodialysis today.   Objective:  Vital signs in last 24 hours:  Temp:  [97.5 F (36.4 C)-98.2 F (36.8 C)] 98 F (36.7 C) (01/19 0800) Pulse Rate:  [66-84] 84 (01/19 0735) Resp:  [9-18] 14 (01/19 0500) BP: (120-158)/(57-107) 125/59 mmHg (01/19 0735) SpO2:  [96 %-100 %] 98 % (01/19 0735) Arterial Line BP: (147-190)/(50-74) 164/55 mmHg (01/18 1230)  Weight change:  Filed Weights   04/16/15 1020 04/16/15 1330 04/16/15 1419  Weight: 56 kg (123 lb 7.3 oz) 54.5 kg (120 lb 2.4 oz) 51.2 kg (112 lb 14 oz)    Intake/Output:    Intake/Output Summary (Last 24 hours) at 04/18/15 0950 Last data filed at 04/17/15 2141  Gross per 24 hour  Intake    653 ml  Output    326 ml  Net    327 ml     Physical Exam: General: NAD  HEENT Anicteric, moist oral mucus membranes  Neck supple  Pulm/lungs Clear bilaterally, normal resp effort   CVS/Heart Irregular, no rub  Abdomen:  Soft, NT, wound vac in place in lower abdomen.    Extremities: No peripheral edema  Neurologic: Alert, follows commands  Skin: No acute rashes  Access: Rt IJ PC       Basic Metabolic Panel:   Recent Labs Lab 04/16/15 0546 04/17/15 0529 04/17/15 1534  NA  --  138 136  K  --  3.0* 3.6  CL  --  101 100*  CO2  --  30 28  GLUCOSE  --  90 105*  BUN  --  22* 25*  CREATININE  --  2.95* 3.37*  CALCIUM  --  8.0* 8.0*  PHOS 4.9*  --   --      CBC:  Recent Labs Lab 04/13/15 0529 04/14/15 1349 04/15/15 0955 04/16/15 0546 04/17/15 0529  WBC  --   --  4.2 5.1 5.3  HGB 9.4* 10.4* 9.6* 10.2* 9.5*  HCT  --   --  28.5* 29.4* 27.7*  MCV  --   --  96.3 98.4 97.6  PLT  --   --  59* 86* 69*      Microbiology:  Recent Results (from the past 720 hour(s))  MRSA PCR Screening     Status: None   Collection Time: 04/09/15  9:45 AM  Result Value Ref Range Status   MRSA by PCR NEGATIVE NEGATIVE Final   Comment:        The GeneXpert MRSA Assay (FDA approved for NASAL specimens only), is one component of a comprehensive MRSA colonization surveillance program. It is not intended to diagnose MRSA infection nor to guide or monitor treatment for MRSA infections.   CULTURE, BLOOD (ROUTINE X 2) w Reflex to PCR ID Panel     Status: None (Preliminary result)   Collection Time: 04/14/15 11:51 AM  Result Value Ref Range Status   Specimen Description BLOOD RIGHT WRIST  Final   Special Requests BOTTLES DRAWN AEROBIC AND ANAEROBIC  10CC  Final   Culture NO GROWTH 3 DAYS  Final   Report Status PENDING  Incomplete  CULTURE, BLOOD (ROUTINE X 2) w Reflex to PCR ID Panel     Status: None (Preliminary result)   Collection Time: 04/14/15 12:02 PM  Result Value Ref Range Status   Specimen Description BLOOD LEFT WRIST  Final   Special Requests   Final    BOTTLES DRAWN AEROBIC AND ANAEROBIC  AER 10CC ANA 5CC   Culture NO GROWTH 3 DAYS  Final   Report Status PENDING  Incomplete  Surgical pcr screen     Status: None   Collection Time: 04/17/15  5:56 AM  Result Value Ref Range Status   MRSA, PCR NEGATIVE NEGATIVE Final   Staphylococcus aureus NEGATIVE NEGATIVE Final    Comment:        The Xpert SA Assay (FDA approved for NASAL specimens in patients over 80 years of age), is one component of a comprehensive surveillance program.  Test performance has been validated by [facility] for patients greater than or equal to 66 year old. It is not intended to diagnose infection nor to guide or monitor treatment.   Wound culture     Status: None (Preliminary result)   Collection Time: 04/17/15  8:52 AM  Result Value Ref Range Status   Specimen Description GROIN  Final   Special Requests NONE  Final   Gram Stain FEW WBC SEEN NO ORGANISMS SEEN   Final   Culture PENDING  Incomplete   Report Status PENDING  Incomplete    Coagulation Studies: No results for input(s): LABPROT, INR in the last 72  hours.  Urinalysis: No results for input(s): COLORURINE, LABSPEC, PHURINE, GLUCOSEU, HGBUR, BILIRUBINUR, KETONESUR, PROTEINUR, UROBILINOGEN, NITRITE, LEUKOCYTESUR in the last 72 hours.  Invalid input(s): APPERANCEUR    Imaging: Dg Chest 1 View  04/16/2015  CLINICAL DATA:  Cough and hypertension EXAM: CHEST 1 VIEW COMPARISON:  April 14, 2015 FINDINGS: There is a fairly small but present pleural effusion on the right with bibasilar consolidation. Lungs elsewhere clear. Heart is upper normal in size with pulmonary vascularity within normal limits. No adenopathy. Central catheter tip is in the superior vena cava. No pneumothorax. There is a stent in the upper abdominal aortic region. IMPRESSION: Persistent right pleural effusion with bibasilar airspace consolidation. No new opacity. No change in cardiac silhouette. Electronically Signed   By: Lowella Grip III M.D.   On: 04/16/2015 19:06   Port Chest Xray - Line Placement  04/17/2015  CLINICAL DATA:  65 year old female central line placement. Initial encounter. EXAM: PORTABLE CHEST 1 VIEW COMPARISON:  04/16/2015 and earlier. FINDINGS: Portable AP upright view at 1150 hours. Left IJ approach central venous catheter has been placed. Tip projects at the lower SVC level 1.5 vertebral bodies below the carina. This is just below the tips of the preexisting right IJ approach dual lumen catheter. No pneumothorax. Veiling opacity at both lung bases in keeping with pleural effusions. Retrocardiac atelectasis. Stable cardiac size and mediastinal contours. No acute pulmonary edema. Partially visible abdominal aortic endograft. IMPRESSION: 1. Left IJ central line placed, tip at the lower SVC level. No pneumothorax. 2. Otherwise stable chest with bilateral pleural effusions and lower lobe collapse. Electronically Signed   By: Genevie Ann M.D.   On: 04/17/2015 12:05     Medications:     . amLODipine  5 mg Oral Daily  . antiseptic oral rinse  7 mL Mouth Rinse  [id number]  . atorvastatin  40 mg Oral Daily  . budesonide-formoterol  2 puff Inhalation BID  . chlorhexidine  15 mL Mouth Rinse BID  . Chlorhexidine Gluconate Cloth  6 each Topical Q0600  . docusate sodium  100 mg Oral BID  . epoetin (EPOGEN/PROCRIT) injection  4,000 Units Intravenous Q T,Th,Sa-HD  . famotidine  20 mg Oral Daily  . fluticasone  2 spray Each Nare Daily  . ipratropium-albuterol  3 mL Nebulization  Q6H  . irbesartan  75 mg Oral BID  . levothyroxine  88 mcg Oral Daily  . lidocaine  1 patch Transdermal Q24H  . metoprolol  50 mg Oral 3 times per day  . multivitamin  1 tablet Oral Daily  . sodium chloride  1 spray Each Nare 5 X Daily  . sodium chloride  3 mL Intravenous Q12H  . sodium chloride  3 mL Intravenous Q12H   sodium chloride, sodium chloride, sodium chloride, acetaminophen, alteplase, chlorpheniramine-HYDROcodone, guaiFENesin-dextromethorphan, magnesium hydroxide, morphine injection, ondansetron **OR** ondansetron (ZOFRAN) IV, ondansetron, oxyCODONE-acetaminophen, sodium chloride  Assessment/ Plan:  65 y.o. female with a PMHX of medical problems of COPD, hypertension, hyperlipidemia, vaginal intraepithelial neoplasia, emergent AAA repair status post rupture on November 18, left to right femoral to femoral bypass , acute renal failure leading to permanent dialysis, significant 3 vessel disease, was admitted on 04/09/2015 with severe shortness of breath after missing dialysis due to weather conditions.   1. End-stage renal disease. [place]. TTS. [facility] nephrology - Patient due for hemodialysis today. We will set a conservative ultrafiltration goal for today.  2. Acute pulmonary edema, requiring NIPPV Resolved. Ultrafiltration target 0.5 kg.  3. Peripheral edema Improved from admission, UF target conservative today at 0.5kg.   4. Anemia of chronic kidney disease  Continue Epogen 4000 units IV with dialysis.  5. Bilateral fluid collection surrounding the  anastomoses of the femoral to femoral bypass. - bilateral groin reexploration with bilateral seroma drainage and wound vac placement 04/17/15.    LOS: 9 [name] 1/19/20179:50 AM

## 2015-04-18 NOTE — Progress Notes (Signed)
[place] [facility] [place] NAME: [name]    MR#:  [id number]  DATE OF BIRTH:  [birth year]  SUBJECTIVE:   S/p drainage of b/l Drainage of b/l Groin Seroma's.  Seen at HD today and tolerating it well.  Still complains of some shortness of breath, cough which seems chronic due to COPD.   REVIEW OF SYSTEMS:    Review of Systems  Constitutional: Negative for fever and chills.  HENT: Negative for congestion and tinnitus.   Eyes: Negative for blurred vision and double vision.  Respiratory: Positive for cough and shortness of breath. Negative for wheezing.   Cardiovascular: Negative for chest pain, orthopnea and PND.  Gastrointestinal: Negative for nausea, vomiting, abdominal pain and diarrhea.  Genitourinary: Negative for dysuria and hematuria.  Neurological: Positive for weakness. Negative for dizziness, sensory change and focal weakness.  All other systems reviewed and are negative.   Nutrition: Renal  Tolerating Diet: Yes Tolerating PT: Await Eval.    DRUG ALLERGIES:   Allergies  Allergen Reactions  . 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 rate 17, height 5\' 4"  (1.626 m), weight 51.2 kg (112 lb 14 oz), last menstrual period 03/30/1988, SpO2 98 %.  PHYSICAL EXAMINATION:   Physical Exam  GENERAL:  65 y.o.-year-old cachectic patient lying in the bed in mild resp. Distress.   EYES: Pupils equal, round, reactive to light and accommodation. No scleral icterus. Extraocular muscles intact.  HEENT: Head atraumatic, normocephalic. Oropharynx and nasopharynx clear.  NECK:  Supple, no jugular venous distention. No thyroid enlargement, no tenderness.  LUNGS: Prolonged inspiratory and expiratory phase. Minimal end expiratory wheezing. No use of accessory muscles of respiration.  CARDIOVASCULAR: S1, S2 normal. No  murmurs, rubs, or gallops.  ABDOMEN: Soft, nontender, nondistended. Bowel sounds present. No organomegaly or mass. + lower abdominal dressing from surgery today w/ wound vac in place.  EXTREMITIES: No cyanosis, clubbing or edema b/l.    NEUROLOGIC: Cranial nerves II through XII are intact. No focal Motor or sensory deficits b/l.  Globally weak PSYCHIATRIC: The patient is alert and oriented x 3. Good affect.  SKIN: No obvious rash, lesion, or ulcer.    LABORATORY PANEL:   CBC  Recent Labs Lab 04/18/15 0856  WBC 6.6  HGB 8.5*  HCT 25.2*  PLT 174   ------------------------------------------------------------------------------------------------------------------  Chemistries   Recent Labs Lab 04/17/15 1534  NA 136  K 3.6  CL 100*  CO2 28  GLUCOSE 105*  BUN 25*  CREATININE 3.37*  CALCIUM 8.0*   ------------------------------------------------------------------------------------------------------------------  Cardiac Enzymes No results for input(s): TROPONINI in the last 168 hours. ------------------------------------------------------------------------------------------------------------------  RADIOLOGY:  Dg Chest 1 View  04/16/2015  CLINICAL DATA:  Cough and hypertension EXAM: CHEST 1 VIEW COMPARISON:  April 14, 2015 FINDINGS: There is a fairly small but present pleural effusion on the right with bibasilar consolidation. Lungs elsewhere clear. Heart is upper normal in size with pulmonary vascularity within normal limits. No adenopathy. Central catheter tip is in the superior vena cava. No pneumothorax. There is a stent in the upper abdominal aortic region. IMPRESSION: Persistent right pleural effusion with bibasilar airspace consolidation. No new opacity. No change in cardiac silhouette. Electronically Signed   By: Lowella Grip III M.D.   On: 04/16/2015 19:06   Port Chest Xray - Line Placement  04/17/2015  CLINICAL DATA:  65 year old female central line placement.  Initial encounter. EXAM: PORTABLE CHEST 1 VIEW COMPARISON:  04/16/2015 and earlier. FINDINGS: Portable AP upright view at 1150 hours. Left IJ approach central venous catheter has been placed. Tip projects at the lower SVC level 1.5 vertebral bodies below the carina. This is just below the tips of the preexisting right IJ approach dual lumen catheter. No pneumothorax. Veiling opacity at both lung bases in keeping with pleural effusions. Retrocardiac atelectasis. Stable cardiac size and mediastinal contours. No acute pulmonary edema. Partially visible abdominal aortic endograft. IMPRESSION: 1. Left IJ central line placed, tip at the lower SVC level. No pneumothorax. 2. Otherwise stable chest with bilateral pleural effusions and lower lobe collapse. Electronically Signed   By: Genevie Ann M.D.   On: 04/17/2015 12:05     ASSESSMENT AND PLAN:   65 year old female with past medical history of end-stage renal disease and hemodialysis, hypertension, hyperlipidemia, hypothyroidism, GERD, COPD who presented to the hospital due to shortness of breath and noted to be in CHF with acute pulmonary edema.  #1 acute on chronic hypoxic respiratory failure-due to a combination of diastolic CHF and underlying COPD. -Patient has had hemodialysis for fluid removal and has clinically improved. -Continue to wean O2 as tolerated.  #2 malignant essential hypertension-blood pressures have improved. Continue metoprolol, verapamil, Norvasc.    #3 hypothyroidism-continue Synthroid.  #4 end-stage renal disease on hemodialysis-nephrology is consulted. Continue dialysis on Tuesday Thursday Saturday.  #5 leukocytosis/low-grade fever-suspected to be secondary to the fluid collection status post femorofemoral bypass. Patient is clinically afebrile and hemodynamically stable. - s/p b/l I & D of Groin Seroma's POD # 1.  Fluid culture (-) so far.    #6 COPD-without acute exacerbation. -Continue Symbicort, DuoNeb nebs as needed.  #7  anemia of chronic disease-secondary to end-stage renal disease. Continue EPO with dialysis. - Hg. Stable.   Will need to discuss w/ vascular about d/c process. PT eval.    All the records are reviewed and case discussed with Care Management/Social Workerr. Management plans discussed with the patient, family and they are in agreement.  CODE STATUS: Full  DVT Prophylaxis: Ted's & SCD's.   TOTAL TIME TAKING CARE OF THIS PATIENT: 25 minutes.   POSSIBLE D/C IN 1-2 DAYS, DEPENDING ON CLINICAL CONDITION.    Henreitta Leber M.D on 04/18/2015 at 4:48 PM  Between 7am to 6pm - Pager - [phone]  After 6pm go to [email or web address] - password [email or web address] [facility]  Office  [phone]  CC: Primary care physician; Arnette Norris, MD

## 2015-04-18 NOTE — Progress Notes (Signed)
Pt left unit at this time to go to dialysis

## 2015-04-18 NOTE — Progress Notes (Signed)
S:  Patient states pain is under very good control she also notes she is hungry  O:  She remains afebrile with stable vital signs        VAC is clean dry and intact and functioning well        She maintains a 1+ right posterior tibial pulse and a 2+ left dorsalis pedis pulse   A:  Status post incision and drainage of bilateral femoral seromas with sartorius muscle flap coverage of the femorofemoral graft  P:  Patient will be transferred to the regular floor she can resume her diet physical therapy will be asked to work with her I will discuss length of antibiotic treatment with infectious disease I will plan to change the VAC in 3 days at which time she could be transferred back to skilled nursing if all looks good

## 2015-04-18 NOTE — Progress Notes (Signed)
Pre-hd tx 

## 2015-04-18 NOTE — Progress Notes (Signed)
Hemodialysis start 

## 2015-04-18 NOTE — Progress Notes (Signed)
SUBJECTIVE: Patient had evacuation of left groin abscess.   Filed Vitals:   04/18/15 0400 04/18/15 0500 04/18/15 0735 04/18/15 0800  BP: 143/71 127/70 125/59   Pulse: 79 82 84   Temp: 98.2 F (36.8 C)   98 F (36.7 C)  TempSrc: Oral   Oral  Resp: 18 14    Height:      Weight:      SpO2: 97% 97% 98%     Intake/Output Summary (Last 24 hours) at 04/18/15 0913 Last data filed at 04/17/15 2141  Gross per 24 hour  Intake    653 ml  Output    326 ml  Net    327 ml    LABS: Basic Metabolic Panel:  Recent Labs  04/16/15 0546 04/17/15 0529 04/17/15 1534  NA  --  138 136  K  --  3.0* 3.6  CL  --  101 100*  CO2  --  30 28  GLUCOSE  --  90 105*  BUN  --  22* 25*  CREATININE  --  2.95* 3.37*  CALCIUM  --  8.0* 8.0*  PHOS 4.9*  --   --    Liver Function Tests: No results for input(s): AST, ALT, ALKPHOS, BILITOT, PROT, ALBUMIN in the last 72 hours. No results for input(s): LIPASE, AMYLASE in the last 72 hours. CBC:  Recent Labs  04/16/15 0546 04/17/15 0529  WBC 5.1 5.3  HGB 10.2* 9.5*  HCT 29.4* 27.7*  MCV 98.4 97.6  PLT 86* 69*   Cardiac Enzymes: No results for input(s): CKTOTAL, CKMB, CKMBINDEX, TROPONINI in the last 72 hours. BNP: Invalid input(s): POCBNP D-Dimer: No results for input(s): DDIMER in the last 72 hours. Hemoglobin A1C: No results for input(s): HGBA1C in the last 72 hours. Fasting Lipid Panel: No results for input(s): CHOL, HDL, LDLCALC, TRIG, CHOLHDL, LDLDIRECT in the last 72 hours. Thyroid Function Tests: No results for input(s): TSH, T4TOTAL, T3FREE, THYROIDAB in the last 72 hours.  Invalid input(s): FREET3 Anemia Panel: No results for input(s): VITAMINB12, FOLATE, FERRITIN, TIBC, IRON, RETICCTPCT in the last 72 hours.   PHYSICAL EXAM General: Well developed, well nourished, in no acute distress HEENT:  Normocephalic and atramatic Neck:  No JVD.  Lungs: Clear bilaterally to auscultation and percussion. Heart: HRRR . Normal S1 and S2  without gallops or murmurs.  Abdomen: Bowel sounds are positive, abdomen soft and non-tender  Msk:  Back normal, normal gait. Normal strength and tone for age. Extremities: No clubbing, cyanosis or edema.   Neuro: Alert and oriented X 3. Psych:  Good affect, responds appropriately  TELEMETRY: Sinus rhythm at 90 bpm  ASSESSMENT AND PLAN: Status post drainage of the left groin with serosanguineous discharge drainage. Patient had aortofemoral bypass done after aortic femoral rupture at [facility]. Hemodynamically stable with normal ejection fraction. Patient is clinically doing well.  Principal Problem:   Acute pulmonary edema with congestive heart failure (HCC) Active Problems:   Leukocytosis   Malignant essential hypertension   Dysphagia   Elevated troponin   Acute pulmonary edema (HCC)    Jamie Laming A, MD, [facility] 04/18/2015 9:13 AM

## 2015-04-18 NOTE — Plan of Care (Signed)
Problem: Pain Managment: Goal: General experience of comfort will improve Outcome: Progressing Prn this pm post dialysis with  Improvement in pain  Problem: Physical Regulation: Goal: Ability to maintain clinical measurements within normal limits will improve Outcome: Progressing bedrest

## 2015-04-18 NOTE — Progress Notes (Signed)
Pt went for dialysis today/ 1/2 liter only drawn off d/t b/p dropping.  Got b/p back up . Pain med. Wound vac   Draining well. Foot pump cont

## 2015-04-19 DIAGNOSIS — E440 Moderate protein-calorie malnutrition: Secondary | ICD-10-CM | POA: Insufficient documentation

## 2015-04-19 LAB — SEROTONIN RELEASE ASSAY (SRA)
SRA .2 IU/mL UFH Ser-aCnc: 7 % (ref 0–20)
SRA 100IU/mL UFH Ser-aCnc: <1 % (ref 0–20)

## 2015-04-19 LAB — CULTURE, BLOOD (ROUTINE X 2)
CULTURE: NO GROWTH
Culture: NO GROWTH

## 2015-04-19 MED ORDER — EPOETIN ALFA 10000 UNIT/ML IJ SOLN
10000.0000 [IU] | INTRAMUSCULAR | Status: DC
  Administered 2015-04-20: 10000 [IU] via INTRAVENOUS
  Filled 2015-04-19: qty 1

## 2015-04-19 MED ORDER — SENNOSIDES-DOCUSATE SODIUM 8.6-50 MG PO TABS
2.0000 | ORAL_TABLET | Freq: Two times a day (BID) | ORAL | Status: DC
  Administered 2015-04-19 – 2015-04-21 (×3): 2 via ORAL
  Filled 2015-04-19 (×6): qty 2

## 2015-04-19 NOTE — Clinical Social Work Note (Signed)
CSW spoke to facility and pt is able to return to facilty when medically stable. CSW will continue to follow to facilitate discharge to [facility].   Darden Dates, MSW, LCSW Clinical Social Worker  [phone]

## 2015-04-19 NOTE — Progress Notes (Signed)
Subjective:  Patient doing well post surgery. Resting in bed. Had HD yesterday.   Objective:  Vital signs in last 24 hours:  Temp:  [97.5 F (36.4 C)-98.3 F (36.8 C)] 97.5 F (36.4 C) (01/20 1421) Pulse Rate:  [80-98] 98 (01/20 1421) Resp:  [17-23] 18 (01/20 1421) BP: (86-138)/(42-68) 138/67 mmHg (01/20 1421) SpO2:  [94 %-97 %] 97 % (01/20 1421)  Weight change:  Filed Weights   04/16/15 1020 04/16/15 1330 04/16/15 1419  Weight: 56 kg (123 lb 7.3 oz) 54.5 kg (120 lb 2.4 oz) 51.2 kg (112 lb 14 oz)    Intake/Output:    Intake/Output Summary (Last 24 hours) at 04/19/15 1458 Last data filed at 04/19/15 0800  Gross per 24 hour  Intake    240 ml  Output    605 ml  Net   -365 ml     Physical Exam: General: NAD  HEENT Anicteric, moist oral mucus membranes  Neck supple  Pulm/lungs Clear bilaterally, normal resp effort   CVS/Heart Irregular, no rub  Abdomen:  Soft, NT, wound vac in place in lower abdomen.    Extremities: trace peripheral edema  Neurologic: Alert, follows commands  Skin: No acute rashes  Access: Rt IJ PC       Basic Metabolic Panel:   Recent Labs Lab 04/16/15 0546 04/17/15 0529 04/17/15 1534  NA  --  138 136  K  --  3.0* 3.6  CL  --  101 100*  CO2  --  30 28  GLUCOSE  --  90 105*  BUN  --  22* 25*  CREATININE  --  2.95* 3.37*  CALCIUM  --  8.0* 8.0*  PHOS 4.9*  --   --      CBC:  Recent Labs Lab 04/14/15 1349 04/15/15 0955 04/16/15 0546 04/17/15 0529 04/18/15 0856  WBC  --  4.2 5.1 5.3 6.6  HGB 10.4* 9.6* 10.2* 9.5* 8.5*  HCT  --  28.5* 29.4* 27.7* 25.2*  MCV  --  96.3 98.4 97.6 96.6  PLT  --  59* 86* 69* 174      Microbiology:  Recent Results (from the past 720 hour(s))  MRSA PCR Screening     Status: None   Collection Time: 04/09/15  9:45 AM  Result Value Ref Range Status   MRSA by PCR NEGATIVE NEGATIVE Final    Comment:        The GeneXpert MRSA Assay (FDA approved for NASAL specimens only), is one component  of a comprehensive MRSA colonization surveillance program. It is not intended to diagnose MRSA infection nor to guide or monitor treatment for MRSA infections.   CULTURE, BLOOD (ROUTINE X 2) w Reflex to PCR ID Panel     Status: None   Collection Time: 04/14/15 11:51 AM  Result Value Ref Range Status   Specimen Description BLOOD RIGHT WRIST  Final   Special Requests BOTTLES DRAWN AEROBIC AND ANAEROBIC  10CC  Final   Culture NO GROWTH 5 DAYS  Final   Report Status 04/19/2015 FINAL  Final  CULTURE, BLOOD (ROUTINE X 2) w Reflex to PCR ID Panel     Status: None   Collection Time: 04/14/15 12:02 PM  Result Value Ref Range Status   Specimen Description BLOOD LEFT WRIST  Final   Special Requests   Final    BOTTLES DRAWN AEROBIC AND ANAEROBIC  AER 10CC ANA 5CC   Culture NO GROWTH 5 DAYS  Final   Report Status 04/19/2015 FINAL  Final  Surgical pcr screen     Status: None   Collection Time: 04/17/15  5:56 AM  Result Value Ref Range Status   MRSA, PCR NEGATIVE NEGATIVE Final   Staphylococcus aureus NEGATIVE NEGATIVE Final    Comment:        The Xpert SA Assay (FDA approved for NASAL specimens in patients over 59 years of age), is one component of a comprehensive surveillance program.  Test performance has been validated by [facility] for patients greater than or equal to 57 year old. It is not intended to diagnose infection nor to guide or monitor treatment.   Wound culture     Status: None (Preliminary result)   Collection Time: 04/17/15  8:52 AM  Result Value Ref Range Status   Specimen Description GROIN  Final   Special Requests NONE  Final   Gram Stain FEW WBC SEEN NO ORGANISMS SEEN   Final   Culture NO GROWTH 2 DAYS  Final   Report Status PENDING  Incomplete    Coagulation Studies: No results for input(s): LABPROT, INR in the last 72 hours.  Urinalysis: No results for input(s): COLORURINE, LABSPEC, PHURINE, GLUCOSEU, HGBUR, BILIRUBINUR, KETONESUR, PROTEINUR,  UROBILINOGEN, NITRITE, LEUKOCYTESUR in the last 72 hours.  Invalid input(s): APPERANCEUR    Imaging: No results found.   Medications:     . amLODipine  5 mg Oral Daily  . antiseptic oral rinse  7 mL Mouth Rinse q12n4p  . atorvastatin  40 mg Oral Daily  . budesonide-formoterol  2 puff Inhalation BID  . chlorhexidine  15 mL Mouth Rinse BID  . Chlorhexidine Gluconate Cloth  6 each Topical Q0600  . epoetin (EPOGEN/PROCRIT) injection  4,000 Units Intravenous Q T,Th,Sa-HD  . famotidine  20 mg Oral Daily  . fluticasone  2 spray Each Nare Daily  . ipratropium-albuterol  3 mL Nebulization Q6H  . irbesartan  75 mg Oral BID  . levothyroxine  88 mcg Oral Daily  . lidocaine  1 patch Transdermal Q24H  . metoprolol  50 mg Oral 3 times per day  . multivitamin  1 tablet Oral Daily  . senna-docusate  2 tablet Oral BID  . sodium chloride  1 spray Each Nare 5 X Daily  . sodium chloride  3 mL Intravenous Q12H  . sodium chloride  3 mL Intravenous Q12H   sodium chloride, sodium chloride, sodium chloride, acetaminophen, alteplase, chlorpheniramine-HYDROcodone, guaiFENesin-dextromethorphan, morphine injection, ondansetron **OR** ondansetron (ZOFRAN) IV, ondansetron, oxyCODONE-acetaminophen, sodium chloride  Assessment/ Plan:  65 y.o. female with a PMHX of medical problems of COPD, hypertension, hyperlipidemia, vaginal intraepithelial neoplasia, emergent AAA repair status post rupture on November 18, left to right femoral to femoral bypass , acute renal failure leading to permanent dialysis, significant 3 vessel disease, was admitted on 04/09/2015 with severe shortness of breath after missing dialysis due to weather conditions.   1. End-stage renal disease. [place]. TTS. [facility] neph[facility] - Patient had HD yesterday, due for HD again tomorrow.  2. Acute pulmonary edema, requiring NIPPV earlier in admission. -Much improved with UF with HD, continue to monitor.  3. Peripheral edema UF target  1kg tomorrow with HD.   4. Anemia of chronic kidney disease  -hgb down to 8.5, maybe due to surgical blood loss, increase epogen to 10000 units IV with HD.  5. Bilateral fluid collection surrounding the anastomoses of the femoral to femoral bypass. - bilateral groin reexploration with bilateral seroma drainage and wound vac placement 04/17/15.    LOS: 10  [name] 1/20/20172:58 PM

## 2015-04-19 NOTE — Progress Notes (Signed)
Nutrition Follow-up  DOCUMENTATION CODES:   Non-severe (moderate) malnutrition in context of acute illness/injury  INTERVENTION:   Meals and Snacks: Cater to patient preferences; will send homemade milkshake as 3pm snack and yogurt on lunch trays and will continue to follow phosphorus Medical Food Supplement Therapy: pt declining supplements again on visit, does not like Magic Cup and does not like Ensure/Boost/Mighty Shakes   NUTRITION DIAGNOSIS:   Inadequate oral intake related to chronic illness, poor appetite as evidenced by estimated needs, per patient/family report.  GOAL:   Patient will meet greater than or equal to 90% of their needs; ongoing  MONITOR:    (Energy Intake, Anthropometrics, Digestive System, Electroyte/Renal Profile)  REASON FOR ASSESSMENT:   Diagnosis    ASSESSMENT:    Pt s/p groin re-exploration and wound vac placement at surgical site on 04/17/2015. Pt s/p HD yesterday.  Diet Order:  Diet renal with fluid restriction Fluid restriction:: 1200 mL Fluid; Room service appropriate?: Yes; Fluid consistency:: Thin    Current Nutrition: Pt reports eating half of french toast this am, eggs, and all of her fruit, did not eat oatmeal on breakfast tray. Pt reports no dinner eaten last night because pt reports 'not feeling well.' Recorded po intake 75-100% of meals on 1/15-1/17.   Gastrointestinal Profile: Last BM: 04/16/2015   Output: net UF 670mL removed with HD yesterday documented.    Scheduled Medications:  . amLODipine  5 mg Oral Daily  . antiseptic oral rinse  7 mL Mouth Rinse q12n4p  . atorvastatin  40 mg Oral Daily  . budesonide-formoterol  2 puff Inhalation BID  . chlorhexidine  15 mL Mouth Rinse BID  . Chlorhexidine Gluconate Cloth  6 each Topical Q0600  . docusate sodium  100 mg Oral BID  . epoetin (EPOGEN/PROCRIT) injection  4,000 Units Intravenous Q T,Th,Sa-HD  . famotidine  20 mg Oral Daily  . fluticasone  2 spray Each Nare Daily  .  ipratropium-albuterol  3 mL Nebulization Q6H  . irbesartan  75 mg Oral BID  . levothyroxine  88 mcg Oral Daily  . lidocaine  1 patch Transdermal Q24H  . metoprolol  50 mg Oral 3 times per day  . multivitamin  1 tablet Oral Daily  . sodium chloride  1 spray Each Nare 5 X Daily  . sodium chloride  3 mL Intravenous Q12H  . sodium chloride  3 mL Intravenous Q12H     Electrolyte/Renal Profile and Glucose Profile:   Recent Labs Lab 04/16/15 0546 04/17/15 0529 04/17/15 1534  NA  --  138 136  K  --  3.0* 3.6  CL  --  101 100*  CO2  --  30 28  BUN  --  22* 25*  CREATININE  --  2.95* 3.37*  CALCIUM  --  8.0* 8.0*  PHOS 4.9*  --   --   GLUCOSE  --  90 105*   Protein Profile: No results for input(s): ALBUMIN in the last 168 hours.   Nutrition-Focused Physical Exam Findings: Nutrition-Focused physical exam completed. Findings are mild-moderate fat depletion, mild-moderate muscle depletion, and 1+ right lower extremity edema.     Weight Trend since Admission: Filed Weights   04/16/15 1020 04/16/15 1330 04/16/15 1419  Weight: 123 lb 7.3 oz (56 kg) 120 lb 2.4 oz (54.5 kg) 112 lb 14 oz (51.2 kg)   RD notes weight loss from admission, pt admitted with pulmonary edema receiving HD   Skin:   (no pressure ulcer)   BMI:  Body mass index is 19.37 kg/(m^2).  Estimated Nutritional Needs:   Kcal:  [id number] kcals (BEE 1155, 1.3 AF, 1.1-1.3 IF)   Protein:  72-90g (1.2-1.5 g/kg)   Fluid:  1000 mL plus UOP   EDUCATION NEEDS:   Education needs addressed   [place], RD, LDN Pager [phone] Weekend/On-Call Pager [phone]

## 2015-04-19 NOTE — Care Management (Addendum)
A resident of [place] admitted to this facility with the diagnosis of acute pulmonary edema. Lived with husband, Jannifer Hick, prior to [facility] [phone]). Established dialysis patient on [address].   AAA repair at [facility] approximately 2-3 months ago. Incision & Drainage of Bilateral Femoral Seromas completed 04/17/15. Wound vac placed on surgical site at that time. Shelbie Ammons RN MSN CCM Care Management [phone]

## 2015-04-19 NOTE — Progress Notes (Signed)
SUBJECTIVE: doing well   Filed Vitals:   04/18/15 2007 04/18/15 2134 04/19/15 0226 04/19/15 0443  BP:  109/51  117/56  Pulse:  94  96  Temp:  98.2 F (36.8 C)  98.1 F (36.7 C)  TempSrc:  Oral  Oral  Resp:  17  17  Height:      Weight:      SpO2: 94% 96% 95% 96%    Intake/Output Summary (Last 24 hours) at 04/19/15 0846 Last data filed at 04/18/15 1700  Gross per 24 hour  Intake    240 ml  Output    605 ml  Net   -365 ml    LABS: Basic Metabolic Panel:  Recent Labs  04/17/15 0529 04/17/15 1534  NA 138 136  K 3.0* 3.6  CL 101 100*  CO2 30 28  GLUCOSE 90 105*  BUN 22* 25*  CREATININE 2.95* 3.37*  CALCIUM 8.0* 8.0*   Liver Function Tests: No results for input(s): AST, ALT, ALKPHOS, BILITOT, PROT, ALBUMIN in the last 72 hours. No results for input(s): LIPASE, AMYLASE in the last 72 hours. CBC:  Recent Labs  04/17/15 0529 04/18/15 0856  WBC 5.3 6.6  HGB 9.5* 8.5*  HCT 27.7* 25.2*  MCV 97.6 96.6  PLT 69* 174   Cardiac Enzymes: No results for input(s): CKTOTAL, CKMB, CKMBINDEX, TROPONINI in the last 72 hours. BNP: Invalid input(s): POCBNP D-Dimer: No results for input(s): DDIMER in the last 72 hours. Hemoglobin A1C: No results for input(s): HGBA1C in the last 72 hours. Fasting Lipid Panel: No results for input(s): CHOL, HDL, LDLCALC, TRIG, CHOLHDL, LDLDIRECT in the last 72 hours. Thyroid Function Tests: No results for input(s): TSH, T4TOTAL, T3FREE, THYROIDAB in the last 72 hours.  Invalid input(s): FREET3 Anemia Panel: No results for input(s): VITAMINB12, FOLATE, FERRITIN, TIBC, IRON, RETICCTPCT in the last 72 hours.   PHYSICAL EXAM General: Well developed, well nourished, in no acute distress HEENT:  Normocephalic and atramatic Neck:  No JVD.  Lungs: Clear bilaterally to auscultation and percussion. Heart: HRRR . Normal S1 and S2 without gallops or murmurs.  Abdomen: Bowel sounds are positive, abdomen soft and non-tender  Msk:  Back normal,  normal gait. Normal strength and tone for age. Extremities: No clubbing, cyanosis or edema.   Neuro: Alert and oriented X 3. Psych:  Good affect, responds appropriately  TELEMETRY:   ASSESSMENT AND PLAN: Dionisio David, MD Physician Signed Cardiology Progress Notes 04/18/2015 9:12 AM    Expand All Collapse All   SUBJECTIVE: Patient had evacuation of left groin abscess.   Filed Vitals:   04/18/15 0400 04/18/15 0500 04/18/15 0735 04/18/15 0800  BP: 143/71 127/70 125/59   Pulse: 79 82 84   Temp: 98.2 F (36.8 C)   98 F (36.7 C)  TempSrc: Oral   Oral  Resp: 18 14    Height:      Weight:      SpO2: 97% 97% 98%     Intake/Output Summary (Last 24 hours) at 04/18/15 0913 Last data filed at 04/17/15 2141  Gross per 24 hour  Intake  653 ml  Output  326 ml  Net  327 ml    LABS: Basic Metabolic Panel:  Recent Labs (last 2 labs)      Recent Labs  04/16/15 0546 04/17/15 0529 04/17/15 1534  NA --  138 136  K --  3.0* 3.6  CL --  101 100*  CO2 --  30 28  GLUCOSE --  90 105*  BUN --  22* 25*  CREATININE --  2.95* 3.37*  CALCIUM --  8.0* 8.0*  PHOS 4.9* --  --      Liver Function Tests:  Recent Labs (last 2 labs)     No results for input(s): AST, ALT, ALKPHOS, BILITOT, PROT, ALBUMIN in the last 72 hours.    Recent Labs (last 2 labs)     No results for input(s): LIPASE, AMYLASE in the last 72 hours.   CBC:  Recent Labs (last 2 labs)      Recent Labs  04/16/15 0546 04/17/15 0529  WBC 5.1 5.3  HGB 10.2* 9.5*  HCT 29.4* 27.7*  MCV 98.4 97.6  PLT 86* 69*     Cardiac Enzymes:  Recent Labs (last 2 labs)     No results for input(s): CKTOTAL, CKMB, CKMBINDEX, TROPONINI in the last 72 hours.   BNP:  Recent Labs (last 2 labs)     Invalid input(s): POCBNP   D-Dimer:  Recent Labs (last 2 labs)     No results for input(s): DDIMER in  the last 72 hours.   Hemoglobin A1C:  Recent Labs (last 2 labs)     No results for input(s): HGBA1C in the last 72 hours.   Fasting Lipid Panel:  Recent Labs (last 2 labs)     No results for input(s): CHOL, HDL, LDLCALC, TRIG, CHOLHDL, LDLDIRECT in the last 72 hours.   Thyroid Function Tests:  Recent Labs (last 2 labs)     No results for input(s): TSH, T4TOTAL, T3FREE, THYROIDAB in the last 72 hours.  Invalid input(s): FREET3   Anemia Panel:  Recent Labs (last 2 labs)     No results for input(s): VITAMINB12, FOLATE, FERRITIN, TIBC, IRON, RETICCTPCT in the last 72 hours.     PHYSICAL EXAM General: Well developed, well nourished, in no acute distress HEENT: Normocephalic and atramatic Neck: No JVD.  Lungs: Clear bilaterally to auscultation and percussion. Heart: HRRR . Normal S1 and S2 without gallops or murmurs.  Abdomen: Bowel sounds are positive, abdomen soft and non-tender  Msk: Back normal, normal gait. Normal strength and tone for age. Extremities: No clubbing, cyanosis or edema.  Neuro: Alert and oriented X 3. Psych: Good affect, responds appropriately  TELEMETRY: Sinus rhythm at 90 bpm  ASSESSMENT AND PLAN: Status post drainage of the left groin with serosanguineous discharge drainage. Patient had aortofemoral bypass done after aortic femoral rupture at [facility]. Hemodynamically stable with normal ejection fraction. Patient is clinically doing well.  Principal Problem:  Acute pulmonary edema with congestive heart failure (HCC) Active Problems:  Leukocytosis  Malignant essential hypertension  Dysphagia  Elevated troponin  Acute pulmonary edema (HCC)    KHAN,SHAUKAT A, MD, [facility] 04/18/2015 9:13 AM                   Principal Problem:   Acute pulmonary edema with congestive heart failure (HCC) Active Problems:   Leukocytosis   Malignant essential hypertension   Dysphagia   Elevated troponin   Acute pulmonary edema (HCC)     Neoma Laming A, MD, [facility] 04/19/2015 8:46 AM

## 2015-04-19 NOTE — Plan of Care (Signed)
Problem: Pain Managment: Goal: General experience of comfort will improve Outcome: Progressing PRN medication given for pain times 1 (one) dose during shift. Improvement noted.

## 2015-04-19 NOTE — Progress Notes (Signed)
[facility] [place] NAME: [name]    MR#:  [id number]  DATE OF BIRTH:  11/12/1950  SUBJECTIVE:   S/p drainage of b/l Drainage of b/l Groin Seroma's POD # 2.  Shortness of breath improved and denies any complaints.    REVIEW OF SYSTEMS:    Review of Systems  Constitutional: Negative for fever and chills.  HENT: Negative for congestion and tinnitus.   Eyes: Negative for blurred vision and double vision.  Respiratory: Positive for cough. Negative for shortness of breath and wheezing.   Cardiovascular: Negative for chest pain, orthopnea and PND.  Gastrointestinal: Negative for nausea, vomiting, abdominal pain and diarrhea.  Genitourinary: Negative for dysuria and hematuria.  Neurological: Positive for weakness. Negative for dizziness, sensory change and focal weakness.  All other systems reviewed and are negative.   Nutrition: Renal  Tolerating Diet: Yes Tolerating PT: Await Eval.    DRUG ALLERGIES:   Allergies  Allergen Reactions  . 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 rate 18, height 5\' 4"  (1.626 m), weight 51.2 kg (112 lb 14 oz), last menstrual period 03/30/1988, SpO2 97 %.  PHYSICAL EXAMINATION:   Physical Exam  GENERAL:  65 y.o.-year-old cachectic patient lying in the bed in no distress.   EYES: Pupils equal, round, reactive to light and accommodation. No scleral icterus. Extraocular muscles intact.  HEENT: Head atraumatic, normocephalic. Oropharynx and nasopharynx clear.  NECK:  Supple, no jugular venous distention. No thyroid enlargement, no tenderness.  LUNGS: Prolonged inspiratory and expiratory phase. Minimal end expiratory wheezing. No use of accessory muscles of respiration.  CARDIOVASCULAR: S1, S2 normal. No murmurs, rubs, or gallops.  ABDOMEN: Soft, nontender, nondistended.  Bowel sounds present. No organomegaly or mass. + lower abdominal dressing from surgery today w/ wound vac in place.  EXTREMITIES: No cyanosis, clubbing or edema b/l.    NEUROLOGIC: Cranial nerves II through XII are intact. No focal Motor or sensory deficits b/l.  Globally weak PSYCHIATRIC: The patient is alert and oriented x 3. Good affect.  SKIN: No obvious rash, lesion, or ulcer.    LABORATORY PANEL:   CBC  Recent Labs Lab 04/18/15 0856  WBC 6.6  HGB 8.5*  HCT 25.2*  PLT 174   ------------------------------------------------------------------------------------------------------------------  Chemistries   Recent Labs Lab 04/17/15 1534  NA 136  K 3.6  CL 100*  CO2 28  GLUCOSE 105*  BUN 25*  CREATININE 3.37*  CALCIUM 8.0*   ------------------------------------------------------------------------------------------------------------------  Cardiac Enzymes No results for input(s): TROPONINI in the last 168 hours. ------------------------------------------------------------------------------------------------------------------  RADIOLOGY:  No results found.   ASSESSMENT AND PLAN:   65 year old female with past medical history of end-stage renal disease and hemodialysis, hypertension, hyperlipidemia, hypothyroidism, GERD, COPD who presented to the hospital due to shortness of breath and noted to be in CHF with acute pulmonary edema.  #1 acute on chronic hypoxic respiratory failure-due to a combination of diastolic CHF and underlying COPD. -Patient has had hemodialysis for fluid removal and has clinically improved. -Continue to wean O2 as tolerated.  #2 malignant essential hypertension-blood pressures stable.  - Continue metoprolol, verapamil, Norvasc.    #3 hypothyroidism-continue Synthroid.  #4 end-stage renal disease on hemodialysis-nephrology following. Continue dialysis on Tuesday Thursday Saturday.  #5 leukocytosis/low-grade fever-suspected to be secondary  to the fluid collection status post femorofemoral bypass. Patient is clinically  afebrile and hemodynamically stable. - s/p b/l I & D of Groin Seroma's POD # 2.  Fluid culture (-) so far.   - cont. Further care as per Vascular surgery.  They plan to change dressing and wound vac over the weekend.   #6 COPD-without acute exacerbation. -Continue Symbicort, DuoNeb nebs as needed.  #7 anemia of chronic disease-secondary to end-stage renal disease. Continue EPO with dialysis. - Hg. Stable.   Will get PT eval.   All the records are reviewed and case discussed with Care Management/Social Workerr. Management plans discussed with the patient, family and they are in agreement.  CODE STATUS: Full  DVT Prophylaxis: Ted's & SCD's.   TOTAL TIME TAKING CARE OF THIS PATIENT: 25 minutes.   POSSIBLE D/C IN 2-3 DAYS, DEPENDING ON CLINICAL CONDITION.    Henreitta Leber M.D on 04/19/2015 at 2:45 PM  Between 7am to 6pm - Pager - [phone]  After 6pm go to [email or web address] - password [email or web address]  Office  [phone]  CC: Primary care physician; Arnette Norris, MD

## 2015-04-19 NOTE — Progress Notes (Signed)
S:  Patient states pain is under very good control   O:  She remains afebrile with stable vital signs        VAC is clean dry and intact and functioning well        She maintains a 1+ right posterior tibial pulse and a 2+ left dorsalis pedis pulse   A:  Status post incision and drainage of bilateral femoral seromas with sartorius muscle flap coverage of the femorofemoral graft  P:  Patient continues to do well I will get physical therapy involved and plan to do her VAC dressing change tomorrow or Sunday

## 2015-04-19 NOTE — Care Management Important Message (Signed)
Important Message  Patient Details  Name: [name] MRN: [id number] Date of Birth: 1950/08/03   Medicare Important Message Given:  Yes    [name] A Shaylee Stanislawski 04/19/2015, 10:10 AM

## 2015-04-20 LAB — CBC
HCT: 21 % — ABNORMAL LOW (ref 35.0–47.0)
Hemoglobin: 7.2 g/dL — ABNORMAL LOW (ref 12.0–16.0)
MCH: 32.9 pg (ref 26.0–34.0)
MCHC: 34.3 g/dL (ref 32.0–36.0)
MCV: 96.1 fL (ref 80.0–100.0)
PLATELETS: 137 10*3/uL — AB (ref 150–440)
RBC: 2.18 MIL/uL — AB (ref 3.80–5.20)
RDW: 16.8 % — AB (ref 11.5–14.5)
WBC: 7.5 10*3/uL (ref 3.6–11.0)

## 2015-04-20 LAB — RENAL FUNCTION PANEL
ALBUMIN: 2.2 g/dL — AB (ref 3.5–5.0)
Anion gap: 9 (ref 5–15)
BUN: 35 mg/dL — ABNORMAL HIGH (ref 6–20)
CHLORIDE: 97 mmol/L — AB (ref 101–111)
CO2: 30 mmol/L (ref 22–32)
CREATININE: 4.61 mg/dL — AB (ref 0.44–1.00)
Calcium: 8.2 mg/dL — ABNORMAL LOW (ref 8.9–10.3)
GFR, EST AFRICAN AMERICAN: 10 mL/min — AB (ref >60)
GFR, EST NON AFRICAN AMERICAN: 9 mL/min — AB (ref >60)
Glucose, Bld: 102 mg/dL — ABNORMAL HIGH (ref 65–99)
PHOSPHORUS: 5.3 mg/dL — AB (ref 2.5–4.6)
POTASSIUM: 3.7 mmol/L (ref 3.5–5.1)
Sodium: 136 mmol/L (ref 135–145)

## 2015-04-20 NOTE — Plan of Care (Signed)
Problem: Pain Managment: Goal: General experience of comfort will improve Outcome: Progressing Percocet 1(one) dose given for pain. Relief noted.

## 2015-04-20 NOTE — Progress Notes (Signed)
SUBJECTIVE: getting HD   Filed Vitals:   04/19/15 2034 04/20/15 0454 04/20/15 0817 04/20/15 1035  BP: 139/55 132/63  143/71  Pulse: 87 83    Temp: 98.7 F (37.1 C) 98.4 F (36.9 C)  98.4 F (36.9 C)  TempSrc: Oral Oral  Oral  Resp: 18 16    Height:      Weight:    138 lb 10.7 oz (62.9 kg)  SpO2: 97% 99% 96%    No intake or output data in the 24 hours ending 04/20/15 1131  LABS: Basic Metabolic Panel:  Recent Labs  04/17/15 1534  NA 136  K 3.6  CL 100*  CO2 28  GLUCOSE 105*  BUN 25*  CREATININE 3.37*  CALCIUM 8.0*   Liver Function Tests: No results for input(s): AST, ALT, ALKPHOS, BILITOT, PROT, ALBUMIN in the last 72 hours. No results for input(s): LIPASE, AMYLASE in the last 72 hours. CBC:  Recent Labs  04/18/15 0856 04/20/15 1037  WBC 6.6 7.5  HGB 8.5* 7.2*  HCT 25.2* 21.0*  MCV 96.6 96.1  PLT 174 137*   Cardiac Enzymes: No results for input(s): CKTOTAL, CKMB, CKMBINDEX, TROPONINI in the last 72 hours. BNP: Invalid input(s): POCBNP D-Dimer: No results for input(s): DDIMER in the last 72 hours. Hemoglobin A1C: No results for input(s): HGBA1C in the last 72 hours. Fasting Lipid Panel: No results for input(s): CHOL, HDL, LDLCALC, TRIG, CHOLHDL, LDLDIRECT in the last 72 hours. Thyroid Function Tests: No results for input(s): TSH, T4TOTAL, T3FREE, THYROIDAB in the last 72 hours.  Invalid input(s): FREET3 Anemia Panel: No results for input(s): VITAMINB12, FOLATE, FERRITIN, TIBC, IRON, RETICCTPCT in the last 72 hours.   PHYSICAL EXAM General: Well developed, well nourished, in no acute distress HEENT:  Normocephalic and atramatic Neck:  No JVD.  Lungs: Clear bilaterally to auscultation and percussion. Heart: HRRR . Normal S1 and S2 without gallops or murmurs.  Abdomen: Bowel sounds are positive, abdomen soft and non-tender  Msk:  Back normal, normal gait. Normal strength and tone for age. Extremities: No clubbing, cyanosis or edema.   Neuro:  Alert and oriented X 3. Psych:  Good affect, responds appropriately    ASSESSMENT AND PLAN:  Dionisio David, MD Physician Signed Cardiology Progress Notes 04/19/2015 8:45 AM    Expand All Collapse All   SUBJECTIVE: doing well   Filed Vitals:   04/18/15 2007 04/18/15 2134 04/19/15 0226 04/19/15 0443  BP:  109/51  117/56  Pulse:  94  96  Temp:  98.2 F (36.8 C)  98.1 F (36.7 C)  TempSrc:  Oral  Oral  Resp:  17  17  Height:      Weight:      SpO2: 94% 96% 95% 96%    Intake/Output Summary (Last 24 hours) at 04/19/15 0846 Last data filed at 04/18/15 1700  Gross per 24 hour  Intake  240 ml  Output  605 ml  Net  -365 ml    LABS: Basic Metabolic Panel:  Recent Labs (last 2 labs)      Recent Labs  04/17/15 0529 04/17/15 1534  NA 138 136  K 3.0* 3.6  CL 101 100*  CO2 30 28  GLUCOSE 90 105*  BUN 22* 25*  CREATININE 2.95* 3.37*  CALCIUM 8.0* 8.0*     Liver Function Tests:  Recent Labs (last 2 labs)     No results for input(s): AST, ALT, ALKPHOS, BILITOT, PROT, ALBUMIN in the last 72 hours.  Recent Labs (last 2 labs)     No results for input(s): LIPASE, AMYLASE in the last 72 hours.   CBC:  Recent Labs (last 2 labs)      Recent Labs  04/17/15 0529 04/18/15 0856  WBC 5.3 6.6  HGB 9.5* 8.5*  HCT 27.7* 25.2*  MCV 97.6 96.6  PLT 69* 174     Cardiac Enzymes:  Recent Labs (last 2 labs)     No results for input(s): CKTOTAL, CKMB, CKMBINDEX, TROPONINI in the last 72 hours.   BNP:  Recent Labs (last 2 labs)     Invalid input(s): POCBNP   D-Dimer:  Recent Labs (last 2 labs)     No results for input(s): DDIMER in the last 72 hours.   Hemoglobin A1C:  Recent Labs (last 2 labs)     No results for input(s): HGBA1C in the last 72 hours.   Fasting Lipid Panel:  Recent Labs (last 2 labs)     No results for input(s): CHOL, HDL, LDLCALC, TRIG,  CHOLHDL, LDLDIRECT in the last 72 hours.   Thyroid Function Tests:  Recent Labs (last 2 labs)     No results for input(s): TSH, T4TOTAL, T3FREE, THYROIDAB in the last 72 hours.  Invalid input(s): FREET3   Anemia Panel:  Recent Labs (last 2 labs)     No results for input(s): VITAMINB12, FOLATE, FERRITIN, TIBC, IRON, RETICCTPCT in the last 72 hours.     PHYSICAL EXAM General: Well developed, well nourished, in no acute distress HEENT: Normocephalic and atramatic Neck: No JVD.  Lungs: Clear bilaterally to auscultation and percussion. Heart: HRRR . Normal S1 and S2 without gallops or murmurs.  Abdomen: Bowel sounds are positive, abdomen soft and non-tender  Msk: Back normal, normal gait. Normal strength and tone for age. Extremities: No clubbing, cyanosis or edema.  Neuro: Alert and oriented X 3. Psych: Good affect, responds appropriately  TELEMETRY:   ASSESSMENT AND PLAN: Dionisio David, MD Physician Signed Cardiology Progress Notes 04/18/2015 9:12 AM    Expand All Collapse All  SUBJECTIVE: Patient had evacuation of left groin abscess.   Filed Vitals:   04/18/15 0400 04/18/15 0500 04/18/15 0735 04/18/15 0800  BP: 143/71 127/70 125/59   Pulse: 79 82 84   Temp: 98.2 F (36.8 C)   98 F (36.7 C)  TempSrc: Oral   Oral  Resp: 18 14    Height:      Weight:      SpO2: 97% 97% 98%     Intake/Output Summary (Last 24 hours) at 04/18/15 0913 Last data filed at 04/17/15 2141  Gross per 24 hour  Intake  653 ml  Output  326 ml  Net  327 ml    LABS: Basic Metabolic Panel:  Recent Labs (last 2 labs)     Recent Labs  04/16/15 0546 04/17/15 0529 04/17/15 1534  NA --  138 136  K --  3.0* 3.6  CL --  101 100*  CO2 --  30 28  GLUCOSE --  90 105*  BUN --  22* 25*   CREATININE --  2.95* 3.37*  CALCIUM --  8.0* 8.0*  PHOS 4.9* --  --      Liver Function Tests:  Recent Labs (last 2 labs)    No results for input(s): AST, ALT, ALKPHOS, BILITOT, PROT, ALBUMIN in the last 72 hours.    Recent Labs (last 2 labs)    No results for input(s): LIPASE, AMYLASE in the last 72  hours.   CBC:  Recent Labs (last 2 labs)     Recent Labs  04/16/15 0546 04/17/15 0529  WBC 5.1 5.3  HGB 10.2* 9.5*  HCT 29.4* 27.7*  MCV 98.4 97.6  PLT 86* 69*     Cardiac Enzymes:  Recent Labs (last 2 labs)    No results for input(s): CKTOTAL, CKMB, CKMBINDEX, TROPONINI in the last 72 hours.   BNP:  Recent Labs (last 2 labs)    Invalid input(s): POCBNP   D-Dimer:  Recent Labs (last 2 labs)    No results for input(s): DDIMER in the last 72 hours.   Hemoglobin A1C:  Recent Labs (last 2 labs)    No results for input(s): HGBA1C in the last 72 hours.   Fasting Lipid Panel:  Recent Labs (last 2 labs)    No results for input(s): CHOL, HDL, LDLCALC, TRIG, CHOLHDL, LDLDIRECT in the last 72 hours.   Thyroid Function Tests:  Recent Labs (last 2 labs)    No results for input(s): TSH, T4TOTAL, T3FREE, THYROIDAB in the last 72 hours.  Invalid input(s): FREET3   Anemia Panel:  Recent Labs (last 2 labs)    No results for input(s): VITAMINB12, FOLATE, FERRITIN, TIBC, IRON, RETICCTPCT in the last 72 hours.     PHYSICAL EXAM General: Well developed, well nourished, in no acute distress HEENT: Normocephalic and atramatic Neck: No JVD.  Lungs: Clear bilaterally to auscultation and percussion. Heart: HRRR . Normal S1 and S2 without gallops or murmurs.  Abdomen: Bowel sounds are positive, abdomen soft and non-tender  Msk: Back normal, normal gait. Normal strength and tone for age. Extremities: No clubbing, cyanosis or edema.  Neuro: Alert and oriented X  3. Psych: Good affect, responds appropriately  TELEMETRY: Sinus rhythm at 90 bpm  ASSESSMENT AND PLAN: Status post drainage of the left groin with serosanguineous discharge drainage. Patient had aortofemoral bypass done after aortic femoral rupture at [facility]. Hemodynamically stable with normal ejection fraction. Patient is clinically doing well.  Principal Problem:  Acute pulmonary edema with congestive heart failure (HCC) Active Problems:  Leukocytosis  Malignant essential hypertension  Dysphagia  Elevated troponin  Acute pulmonary edema (HCC)   Jeanett Antonopoulos A, MD, [facility] 04/18/2015 9:13 AM                   Principal Problem:  Acute pulmonary edema with congestive heart failure (HCC) Active Problems:  Leukocytosis  Malignant essential hypertension  Dysphagia  Elevated troponin  Acute pulmonary edema (HCC)    Patrick Salemi A, MD, [facility] 04/19/2015 8:46 AM                   Principal Problem:   Acute pulmonary edema with congestive heart failure (HCC) Active Problems:   Leukocytosis   Malignant essential hypertension   Dysphagia   Elevated troponin   Acute pulmonary edema (HCC)   Malnutrition of moderate degree    Kashina Mecum A, MD, [facility] 04/20/2015 11:31 AM

## 2015-04-20 NOTE — Progress Notes (Signed)
[facility] NAME: [name]    MR#:  [id number]  DATE OF BIRTH:  [birth year]  SUBJECTIVE:   S/p drainage of b/l Drainage of b/l Groin Seroma's POD # 3.  Continues to have Shortness of breath and remains on oxygen therapy . Overall feels good. Has wound VAC in left groin area/ lower abdomen . Vascular surgery to change her wound VAC today. Likely discharge to rehabilitation facility on Monday.   REVIEW OF SYSTEMS:    Review of Systems  Constitutional: Negative for fever and chills.  HENT: Negative for congestion and tinnitus.   Eyes: Negative for blurred vision and double vision.  Respiratory: Positive for cough. Negative for shortness of breath and wheezing.   Cardiovascular: Negative for chest pain, orthopnea and PND.  Gastrointestinal: Negative for nausea, vomiting, abdominal pain and diarrhea.  Genitourinary: Negative for dysuria and hematuria.  Neurological: Positive for weakness. Negative for dizziness, sensory change and focal weakness.  All other systems reviewed and are negative.   Nutrition: Renal  Tolerating Diet: Yes Tolerating PT: Await Eval.    DRUG ALLERGIES:   Allergies  Allergen Reactions  . 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 rate 16, height 5\' 4"  (1.626 m), weight 62.9 kg (138 lb 10.7 oz), last menstrual period 03/30/1988, SpO2 96 %.  PHYSICAL EXAMINATION:   Physical Exam  GENERAL:  65 y.o.-year-old cachectic patient lying in the bed in no distress.   EYES: Pupils equal, round, reactive to light and accommodation. No scleral icterus. Extraocular muscles intact.  HEENT: Head atraumatic, normocephalic. Oropharynx and nasopharynx clear.  NECK:  Supple, no jugular venous distention. No thyroid enlargement, no tenderness.  LUNGS: Intermittent bilateral rhonchi and  crackles, good air entrance, no dullness to percussion CARDIOVASCULAR: S1, S2 normal. No murmurs, rubs, or gallops.  ABDOMEN: Soft, nontender, nondistended. Bowel sounds present. No organomegaly or mass. Lower abdominal/inguinal area wound vac in place with minimal drainage.  EXTREMITIES: No cyanosis, clubbing or edema b/l.    NEUROLOGIC: Cranial nerves II through XII are intact. No focal Motor or sensory deficits b/l.  Globally weak PSYCHIATRIC: The patient is alert and oriented x 3. Good affect.  SKIN: No obvious rash, lesion, or ulcer.    LABORATORY PANEL:   CBC  Recent Labs Lab 04/20/15 1037  WBC 7.5  HGB 7.2*  HCT 21.0*  PLT 137*   ------------------------------------------------------------------------------------------------------------------  Chemistries   Recent Labs Lab 04/20/15 1037  NA 136  K 3.7  CL 97*  CO2 30  GLUCOSE 102*  BUN 35*  CREATININE 4.61*  CALCIUM 8.2*   ------------------------------------------------------------------------------------------------------------------  Cardiac Enzymes No results for input(s): TROPONINI in the last 168 hours. ------------------------------------------------------------------------------------------------------------------  RADIOLOGY:  No results found.   ASSESSMENT AND PLAN:   65 year old female with past medical history of end-stage renal disease and hemodialysis, hypertension, hyperlipidemia, hypothyroidism, GERD, COPD who presented to the hospital due to shortness of breath and noted to be in CHF with acute pulmonary edema.  #1 acute on chronic hypoxic respiratory failure-due to a combination of diastolic CHF and underlying COPD. -Patient has had hemodialysis for fluid removal and has clinically improved. -Continue to wean O2 as tolerated.  #2 malignant essential hypertension-blood pressures stable.  - Continue metoprolol, verapamil, Norvasc.    #3 hypothyroidism-continue Synthroid.  #4 end-stage  renal disease on hemodialysis-nephrology following.  Continue dialysis on Tuesday Thursday Saturday. AV fistula formation planning per Dr. Delana Meyer  #5 leukocytosis/low-grade fever-suspected to be secondary to the fluid collection status post femorofemoral bypass. Patient is clinically afebrile and hemodynamically stable. - s/p b/l I & D of Groin Seroma's POD # 3.  Fluid culture (-) so far.   - cont. Further care as per Vascular surgery.  Vascular surgery plans to change dressing and wound vac today, possible discharge to skilled nursing facility for rehabilitation on Monday.    #6 COPD-without acute exacerbation. -Continue Symbicort, DuoNeb nebs as needed.  #7 anemia of chronic disease-secondary to end-stage renal disease. Continue EPO with dialysis. - Hg. Stable.   #8. Generalized weakness, Will get PT eval.   All the records are reviewed and case discussed with Care Management/Social Workerr. Management plans discussed with the patient, family and they are in agreement.  CODE STATUS: Full  DVT Prophylaxis: Ted's & SCD's.   TOTAL TIME TAKING CARE OF THIS PATIENT: 30 minutes.  Discussed with Dr. Delana Meyer, vascular surgery about treatment plan and discharge planning POSSIBLE D/C IN 2-3 DAYS, DEPENDING ON CLINICAL CONDITION.    Theodoro Grist M.D on 04/20/2015 at 12:33 PM  Between 7am to 6pm - Pager - [phone]  After 6pm go to [email or web address] - password [email or web address] [facility]  Office  [phone]  CC: Primary care physician; Arnette Norris, MD

## 2015-04-20 NOTE — Progress Notes (Signed)
PT Cancellation Note  Patient Details Name: [name] MRN: [id number] DOB: March 17, 1951   Cancelled Treatment:    Reason Eval/Treat Not Completed: Patient at procedure or test/unavailable. PT consult received. Pt currently at dialysis, unavailable for therapy at this time. Will re-attempt, time permitting.   Tuck Dulworth 04/20/2015, 12:06 PM  Greggory Stallion, PT, DPT [phone]

## 2015-04-20 NOTE — Progress Notes (Signed)
Subjective:  Patient seen during hemodialysis. Seems to be tolerating well. Blood flow rate 350 at present.    HEMODIALYSIS FLOWSHEET:  Blood Flow Rate (mL/min): 350 mL/min Arterial Pressure (mmHg): -140 mmHg Venous Pressure (mmHg): 130 mmHg Transmembrane Pressure (mmHg): 40 mmHg Ultrafiltration Rate (mL/min): 570 mL/min Dialysate Flow Rate (mL/min): 800 ml/min Conductivity: Machine : 14.6 Conductivity: Machine : 14.6 Dialysis Fluid Bolus: Normal Saline Bolus Amount (mL): 250 mL Intra-Hemodialysis Comments: 1124ml (resting with eyes open)    Objective:  Vital signs in last 24 hours:  Temp:  [97.5 F (36.4 C)-98.7 F (37.1 C)] 98.4 F (36.9 C) (01/21 1035) Pulse Rate:  [83-98] 83 (01/21 0454) Resp:  [16-18] 16 (01/21 0454) BP: (132-143)/(55-71) 143/71 mmHg (01/21 1035) SpO2:  [96 %-99 %] 96 % (01/21 0817) Weight:  [62.9 kg (138 lb 10.7 oz)] 62.9 kg (138 lb 10.7 oz) (01/21 1035)  Weight change:  Filed Weights   04/16/15 1330 04/16/15 1419 04/20/15 1035  Weight: 54.5 kg (120 lb 2.4 oz) 51.2 kg (112 lb 14 oz) 62.9 kg (138 lb 10.7 oz)    Intake/Output:   No intake or output data in the 24 hours ending 04/20/15 1255   Physical Exam: General: NAD  HEENT Anicteric, moist oral mucus membranes  Neck supple  Pulm/lungs Clear bilaterally, normal resp effort   CVS/Heart Irregular, no rub  Abdomen:  Soft, NT, wound vac in place in lower abdomen.    Extremities: trace peripheral edema  Neurologic: Alert, follows commands  Skin: No acute rashes  Access: Rt IJ PC       Basic Metabolic Panel:   Recent Labs Lab 04/16/15 0546 04/17/15 0529 04/17/15 1534 04/20/15 1037  NA  --  138 136 136  K  --  3.0* 3.6 3.7  CL  --  101 100* 97*  CO2  --  30 28 30   GLUCOSE  --  90 105* 102*  BUN  --  22* 25* 35*  CREATININE  --  2.95* 3.37* 4.61*  CALCIUM  --  8.0* 8.0* 8.2*  PHOS 4.9*  --   --  5.3*     CBC:  Recent Labs Lab 04/15/15 0955 04/16/15 0546  04/17/15 0529 04/18/15 0856 04/20/15 1037  WBC 4.2 5.1 5.3 6.6 7.5  HGB 9.6* 10.2* 9.5* 8.5* 7.2*  HCT 28.5* 29.4* 27.7* 25.2* 21.0*  MCV 96.3 98.4 97.6 96.6 96.1  PLT 59* 86* 69* 174 137*      Microbiology:  Recent Results (from the past 720 hour(s))  MRSA PCR Screening     Status: None   Collection Time: 04/09/15  9:45 AM  Result Value Ref Range Status   MRSA by PCR NEGATIVE NEGATIVE Final    Comment:        The GeneXpert MRSA Assay (FDA approved for NASAL specimens only), is one component of a comprehensive MRSA colonization surveillance program. It is not intended to diagnose MRSA infection nor to guide or monitor treatment for MRSA infections.   CULTURE, BLOOD (ROUTINE X 2) w Reflex to PCR ID Panel     Status: None   Collection Time: 04/14/15 11:51 AM  Result Value Ref Range Status   Specimen Description BLOOD RIGHT WRIST  Final   Special Requests BOTTLES DRAWN AEROBIC AND ANAEROBIC  10CC  Final   Culture NO GROWTH 5 DAYS  Final   Report Status 04/19/2015 FINAL  Final  CULTURE, BLOOD (ROUTINE X 2) w Reflex to PCR ID Panel     Status: None  Collection Time: 04/14/15 12:02 PM  Result Value Ref Range Status   Specimen Description BLOOD LEFT WRIST  Final   Special Requests   Final    BOTTLES DRAWN AEROBIC AND ANAEROBIC  AER 10CC ANA 5CC   Culture NO GROWTH 5 DAYS  Final   Report Status 04/19/2015 FINAL  Final  Surgical pcr screen     Status: None   Collection Time: 04/17/15  5:56 AM  Result Value Ref Range Status   MRSA, PCR NEGATIVE NEGATIVE Final   Staphylococcus aureus NEGATIVE NEGATIVE Final    Comment:        The Xpert SA Assay (FDA approved for NASAL specimens in patients over 72 years of age), is one component of a comprehensive surveillance program.  Test performance has been validated by [facility] for patients greater than or equal to 49 year old. It is not intended to diagnose infection nor to guide or monitor treatment.   Anaerobic  culture     Status: None (Preliminary result)   Collection Time: 04/17/15  8:52 AM  Result Value Ref Range Status   Specimen Description GROIN  Final   Special Requests NONE  Final   Culture NO GROWTH 3 DAYS  Final   Report Status PENDING  Incomplete  Wound culture     Status: None (Preliminary result)   Collection Time: 04/17/15  8:52 AM  Result Value Ref Range Status   Specimen Description GROIN  Final   Special Requests NONE  Final   Gram Stain FEW WBC SEEN NO ORGANISMS SEEN   Final   Culture NO GROWTH 3 DAYS  Final   Report Status PENDING  Incomplete    Coagulation Studies: No results for input(s): LABPROT, INR in the last 72 hours.  Urinalysis: No results for input(s): COLORURINE, LABSPEC, PHURINE, GLUCOSEU, HGBUR, BILIRUBINUR, KETONESUR, PROTEINUR, UROBILINOGEN, NITRITE, LEUKOCYTESUR in the last 72 hours.  Invalid input(s): APPERANCEUR    Imaging: No results found.   Medications:     . amLODipine  5 mg Oral Daily  . antiseptic oral rinse  7 mL Mouth Rinse q12n4p  . atorvastatin  40 mg Oral Daily  . budesonide-formoterol  2 puff Inhalation BID  . chlorhexidine  15 mL Mouth Rinse BID  . Chlorhexidine Gluconate Cloth  6 each Topical Q0600  . epoetin (EPOGEN/PROCRIT) injection  10,000 Units Intravenous Q T,Th,Sa-HD  . famotidine  20 mg Oral Daily  . fluticasone  2 spray Each Nare Daily  . ipratropium-albuterol  3 mL Nebulization Q6H  . irbesartan  75 mg Oral BID  . levothyroxine  88 mcg Oral Daily  . lidocaine  1 patch Transdermal Q24H  . metoprolol  50 mg Oral 3 times per day  . multivitamin  1 tablet Oral Daily  . senna-docusate  2 tablet Oral BID  . sodium chloride  1 spray Each Nare 5 X Daily  . sodium chloride  3 mL Intravenous Q12H  . sodium chloride  3 mL Intravenous Q12H   sodium chloride, sodium chloride, sodium chloride, acetaminophen, alteplase, chlorpheniramine-HYDROcodone, guaiFENesin-dextromethorphan, morphine injection, ondansetron **OR**  ondansetron (ZOFRAN) IV, ondansetron, oxyCODONE-acetaminophen, sodium chloride  Assessment/ Plan:  65 y.o. female with a PMHX of medical problems of COPD, hypertension, hyperlipidemia, vaginal intraepithelial neoplasia, emergent AAA repair status post rupture on November 18, left to right femoral to femoral bypass , acute renal failure leading to permanent dialysis, significant 3 vessel disease, was admitted on 04/09/2015 with severe shortness of breath after missing dialysis due to weather  conditions.   1. End-stage renal disease. [place]. TTS. [facility] nephrology - Patient seen during hemodialysis. Tolerating well. Potassium currently 3.7 and acceptable. Continue dialysis on a Tuesday, Thursday, Saturday schedule.  2. Acute pulmonary edema, requiring NIPPV earlier in admission. -Overall improved. Ultrafiltration target 1.5 kg today.  3. Peripheral edema Also improved from admission. Continue ultrafiltration with dialysis.  4. Anemia of chronic kidney disease  -Hemoglobin continues to decline. Now down to 7.2. Continue Epogen 10,000 units IV with dialysis which was just increased. Patient may need blood transfusion for hemoglobin of 7 or less.  5. Bilateral fluid collection surrounding the anastomoses of the femoral to femoral bypass. - bilateral groin reexploration with bilateral seroma drainage and wound vac placement 04/17/15.    LOS: [place] 1/21/201712:55 PM

## 2015-04-20 NOTE — Progress Notes (Signed)
S:  Patient states pain is under very good control   O:  She remains afebrile with stable vital signs        VAC is removed and the groin incisions are clean dry and intact         She maintains a 1+ right posterior tibial pulse and a 2+ left dorsalis pedis pulse   A:  Status post incision and drainage of bilateral femoral seromas with sartorius muscle flap coverage of the femorofemoral graft  P:  Patient continues to do well I will get physical therapy involved and VAC dressing changed

## 2015-04-21 LAB — CBC
HEMATOCRIT: 20.9 % — AB (ref 35.0–47.0)
HEMOGLOBIN: 7.2 g/dL — AB (ref 12.0–16.0)
MCH: 33.4 pg (ref 26.0–34.0)
MCHC: 34.4 g/dL (ref 32.0–36.0)
MCV: 97 fL (ref 80.0–100.0)
Platelets: 107 10*3/uL — ABNORMAL LOW (ref 150–440)
RBC: 2.15 MIL/uL — ABNORMAL LOW (ref 3.80–5.20)
RDW: 16.1 % — ABNORMAL HIGH (ref 11.5–14.5)
WBC: 6.4 10*3/uL (ref 3.6–11.0)

## 2015-04-21 LAB — WOUND CULTURE: Culture: NO GROWTH

## 2015-04-21 LAB — PREPARE RBC (CROSSMATCH)

## 2015-04-21 MED ORDER — IPRATROPIUM-ALBUTEROL 0.5-2.5 (3) MG/3ML IN SOLN
3.0000 mL | Freq: Three times a day (TID) | RESPIRATORY_TRACT | Status: DC
  Administered 2015-04-21 – 2015-04-22 (×5): 3 mL via RESPIRATORY_TRACT
  Filled 2015-04-21 (×5): qty 3

## 2015-04-21 MED ORDER — GUAIFENESIN ER 600 MG PO TB12
600.0000 mg | ORAL_TABLET | Freq: Two times a day (BID) | ORAL | Status: DC
  Administered 2015-04-21 – 2015-04-22 (×3): 600 mg via ORAL
  Filled 2015-04-21 (×3): qty 1

## 2015-04-21 MED ORDER — IPRATROPIUM-ALBUTEROL 0.5-2.5 (3) MG/3ML IN SOLN: 3.0000 mL | Freq: Four times a day (QID) | RESPIRATORY_TRACT | Status: DC | PRN

## 2015-04-21 MED ORDER — SODIUM CHLORIDE 0.9 % IV SOLN: Freq: Once | INTRAVENOUS | Status: DC

## 2015-04-21 NOTE — Evaluation (Signed)
Physical Therapy RE-Evaluation Patient Details Name: [name] MRN: [id number] DOB: 28-Jul-1950 Today's Date: 04/21/2015   History of Present Illness  [name] is a 65 y.o. female with a known history of hypertension, hyperlipidemia, hypothyroidism, gastroesophageal reflux disease, abdominal aortic aneurysm ruptured and repaired, B12 deficiency, left ankle skin cancer, COPD, not on oxygen at home, a former smoker who presents to the hospital with complaints of shortness of breath. Patient was admitted with O2 sats at 70%; She comes from rehab at [facility] s/p AAA rupture. Pt is now s/p I&D of B femoral seromas and has a wound vac across lower groin area on 04/17/15. Pt currently scheduled for blood transfusion, limited in mobility attempts this date.   Clinical Impression  Pt re-evaluated s/p Sx on 04/21/15. Pt limited with mobility efforts as she is receiving blood transfusion this date. Pt currently agreeable to therapy and motivated to perform bed mobility/ther-ex this date. Pt with significant R LE weakness, needing assist for all there-ex. Pt able to complete rolling in bed with cga. Pt demonstrates strength/endurance/mobility deficits at this time. Would benefit from skilled PT to address above deficits and promote optimal return to PLOF; recommend transition to STR upon discharge from acute hospitalization.      Follow Up Recommendations SNF    Equipment Recommendations       Recommendations for Other Services       Precautions / Restrictions Precautions Precautions: Fall Restrictions Weight Bearing Restrictions: No      Mobility  Bed Mobility Overal bed mobility: Needs Assistance Bed Mobility: Rolling Rolling: Min guard         General bed mobility comments: Pt able to roll using cga and B bed rails. Pt demonstrates safe technique.  Transfers                 General transfer comment: unable to perform further mobility attempts, as pt  receiving blood transfusion  Ambulation/Gait                Stairs            Wheelchair Mobility    Modified Rankin (Stroke Patients Only)       Balance                                             Pertinent Vitals/Pain Pain Assessment: No/denies pain    Home Living Family/patient expects to be discharged to:: Skilled nursing facility                 Additional Comments: Patient was at home independent prior to Nov 2016; She lives in a single story home with 3 steps to enter with B rails; She has no AD; Patient has a shower/tub combo; She has help from her husband who is there 24/7    Prior Function Level of Independence: Needs assistance   Gait / Transfers Assistance Needed: unable to ambulate; patient needed assistance with all transfers; able to stand up to 2 min with RW with assistance;           Hand Dominance        Extremity/Trunk Assessment   Upper Extremity Assessment: Generalized weakness (grossly 4/5)           Lower Extremity Assessment: Generalized weakness RLE Deficits / Details: R LE grossly 2+/5 LLE Deficits / Details: grossly 3/5  Communication   Communication: No difficulties  Cognition Arousal/Alertness: Awake/alert Behavior During Therapy: WFL for tasks assessed/performed Overall Cognitive Status: Within Functional Limits for tasks assessed                      General Comments      Exercises Other Exercises Other Exercises: supine ther-ex performed including B resisted ankle pumps, SLRs, SAQ, hip add squeezes, heel slides, and quad sets. All ther-ex performed x 10 reps with min assist on R LE and cga on L LE. Cues given for correct technique. No pain noted during ther-ex      Assessment/Plan    PT Assessment Patient needs continued PT services  PT Diagnosis Difficulty walking;Generalized weakness   PT Problem List Decreased strength;Decreased activity tolerance;Decreased  balance;Decreased mobility;Impaired sensation;Cardiopulmonary status limiting activity;Decreased safety awareness  PT Treatment Interventions DME instruction;Balance training;Gait training;Neuromuscular re-education;Stair training;Functional mobility training;Therapeutic activities;Therapeutic exercise;Patient/family education   PT Goals (Current goals can be found in the Care Plan section) Acute Rehab PT Goals Patient Stated Goal: to get stronger PT Goal Formulation: With patient Time For Goal Achievement: 04/25/15 Potential to Achieve Goals: Good    Frequency Min 2X/week   Barriers to discharge        Co-evaluation               End of Session   Activity Tolerance: Patient tolerated treatment well Patient left: in bed;with bed alarm set Nurse Communication: Mobility status         Time: [id number] PT Time Calculation (min) (ACUTE ONLY): 18 min   Charges:   PT Evaluation $PT Re-evaluation: 1 Procedure PT Treatments $Therapeutic Exercise: 8-22 mins   PT G Codes:        [name] April 27, 2015, 10:57 AM  Greggory Stallion, PT, DPT [phone]

## 2015-04-21 NOTE — Progress Notes (Signed)
[place] [facility] [place] NAME: [name]    MR#:  [id number]  DATE OF BIRTH:  [birth year]  SUBJECTIVE:   S/p drainage of b/l Drainage of b/l Groin Seroma's POD # 4.  Less shortness of breath and weaned off oxygen therapy after hemodialysis yesterday . Overall feels good. Wound VAC in left groin area/ lower abdomen has been changed by Vascular surgery January 21st . Likely discharge to rehabilitation facility on Monday. Wound cultures remain negative. Patient has been coughing but not able to produce much phlegm. Patient is anemic today with hemoglobin level of 7.2, he is going to be transfused due to continuous shortness of breath, weakness and ongoing blood losses with wound VAC  REVIEW OF SYSTEMS:    Review of Systems  Constitutional: Negative for fever and chills.  HENT: Negative for congestion and tinnitus.   Eyes: Negative for blurred vision and double vision.  Respiratory: Positive for cough. Negative for shortness of breath and wheezing.   Cardiovascular: Negative for chest pain, orthopnea and PND.  Gastrointestinal: Negative for nausea, vomiting, abdominal pain and diarrhea.  Genitourinary: Negative for dysuria and hematuria.  Neurological: Positive for weakness. Negative for dizziness, sensory change and focal weakness.  All other systems reviewed and are negative.   Nutrition: Renal  Tolerating Diet: Yes Tolerating PT: Await Eval.    DRUG ALLERGIES:   Allergies  Allergen Reactions  . 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 rate 20, height 5\' 4"  (1.626 m), weight 62.9 kg (138 lb 10.7 oz), last menstrual period 03/30/1988, SpO2 94 %.  PHYSICAL EXAMINATION:   Physical Exam  GENERAL:  65 y.o.-year-old cachectic patient lying in the bed in no distress, coughing intermittently.   EYES: Pupils  equal, round, reactive to light and accommodation. No scleral icterus. Extraocular muscles intact.  HEENT: Head atraumatic, normocephalic. Oropharynx and nasopharynx clear.  NECK:  Supple, no jugular venous distention. No thyroid enlargement, no tenderness.  LUNGS: Bilateral rhonchi and crackles, good air entrance, no dullness to percussion CARDIOVASCULAR: S1, S2 normal. No murmurs, rubs, or gallops.  ABDOMEN: Soft, nontender, nondistended. Bowel sounds present. No organomegaly or mass. Lower abdominal/inguinal area wound vac in place with bloody drainage.  EXTREMITIES: No cyanosis, clubbing or edema b/l.    NEUROLOGIC: Cranial nerves II through XII are intact. No focal Motor or sensory deficits b/l.  Globally weak PSYCHIATRIC: The patient is alert and oriented x 3. Good affect.  SKIN: No obvious rash, lesion, or ulcer.    LABORATORY PANEL:   CBC  Recent Labs Lab 04/21/15 0614  WBC 6.4  HGB 7.2*  HCT 20.9*  PLT 107*   ------------------------------------------------------------------------------------------------------------------  Chemistries   Recent Labs Lab 04/20/15 1037  NA 136  K 3.7  CL 97*  CO2 30  GLUCOSE 102*  BUN 35*  CREATININE 4.61*  CALCIUM 8.2*   ------------------------------------------------------------------------------------------------------------------  Cardiac Enzymes No results for input(s): TROPONINI in the last 168 hours. ------------------------------------------------------------------------------------------------------------------  RADIOLOGY:  No results found.   ASSESSMENT AND PLAN:   65 year old female with past medical history of end-stage renal disease and hemodialysis, hypertension, hyperlipidemia, hypothyroidism, GERD, COPD who presented to the hospital due to shortness of breath and noted to be in CHF with acute pulmonary edema.  #1 acute on chronic hypoxic respiratory failure-due to a combination of diastolic CHF and  underlying COPD. -  Patient has had hemodialysis for fluid removal and has clinically improved. Weaned of O2 , following oxygenation tomorrow morning  #2 malignant essential hypertension-blood pressures improved and remained  stable.  - Continue metoprolol, verapamil, Norvasc.    #3 hypothyroidism-continue Synthroid.  #4 end-stage renal disease on hemodialysis-nephrology following. Continue dialysis on Tuesday Thursday Saturday. AV fistula formation planning per Dr. Delana Meyer, discussed with he him,  likely as outpatient  #5 leukocytosis/low-grade fever-suspected to be secondary to the fluid collection status post femorofemoral bypass. Patient is clinically afebrile and hemodynamically stable. - s/p b/l I & D of Groin Seroma's POD # 4.  Fluid culture (-) so far.   - cont. Further care as per Vascular surgery.  Vascular surgery changed dressing and wound vac 04/20/15, discharge to skilled nursing facility for rehabilitation on Monday.    #6 COPD-without acute exacerbation. -Continue Symbicort, DuoNeb nebs as needed.  #7 anemia of chronic disease-secondary to end-stage renal disease. Continue EPO with dialysis. - Hg. is low today, however, no bleeding apart from wound VAC losses were noted, patient is going to be transfused 1 unit of packed blood cells following hemoglobin level later today and tomorrow morning.   #8. Generalized weakness,  PT eval., skilled nursing facility was recommended   All the records are reviewed and case discussed with Care Management/Social Workerr. Management plans discussed with the patient, family and they are in agreement.  CODE STATUS: Full  DVT Prophylaxis: Ted's & SCD's.   TOTAL TIME TAKING CARE OF THIS PATIENT: 30 minutes.  POSSIBLE D/C IN 1 day, DEPENDING ON CLINICAL CONDITION. Discussed with care management today    Jamesen Stahnke M.D on 04/21/2015 at 12:49 PM  Between 7am to 6pm - Pager - [phone]  After 6pm go to [email or web address] - password [email or web address]  [facility]  Office  [phone]  CC: Primary care physician; Arnette Norris, MD

## 2015-04-21 NOTE — Progress Notes (Signed)
Patient has arrived from to room 203 as a transfer.

## 2015-04-21 NOTE — Progress Notes (Signed)
Subjective:  Patient completed hemodialysis yesterday. Tolerated well. In good spirits this a.m.    Objective:  Vital signs in last 24 hours:  Temp:  [97.6 F (36.4 C)-98.7 F (37.1 C)] 97.6 F (36.4 C) (01/22 1328) Pulse Rate:  [75-98] 88 (01/22 1328) Resp:  [16-20] 20 (01/22 1228) BP: (116-135)/(49-62) 126/50 mmHg (01/22 1328) SpO2:  [94 %-100 %] 98 % (01/22 1343)  Weight change:  Filed Weights   04/16/15 1330 04/16/15 1419 04/20/15 1035  Weight: 54.5 kg (120 lb 2.4 oz) 51.2 kg (112 lb 14 oz) 62.9 kg (138 lb 10.7 oz)    Intake/Output:    Intake/Output Summary (Last 24 hours) at 04/21/15 1351 Last data filed at 04/21/15 0800  Gross per 24 hour  Intake    120 ml  Output   1500 ml  Net  -1380 ml     Physical Exam: General: NAD  HEENT Anicteric, moist oral mucus membranes  Neck supple  Pulm/lungs Clear bilaterally, normal resp effort   CVS/Heart Irregular, no rub  Abdomen:  Soft, NT, wound vac in place in lower abdomen.    Extremities: trace peripheral edema  Neurologic: Alert, follows commands  Skin: No acute rashes  Access: Rt IJ PC       Basic Metabolic Panel:   Recent Labs Lab 04/16/15 0546 04/17/15 0529 04/17/15 1534 04/20/15 1037  NA  --  138 136 136  K  --  3.0* 3.6 3.7  CL  --  101 100* 97*  CO2  --  30 28 30   GLUCOSE  --  90 105* 102*  BUN  --  22* 25* 35*  CREATININE  --  2.95* 3.37* 4.61*  CALCIUM  --  8.0* 8.0* 8.2*  PHOS 4.9*  --   --  5.3*     CBC:  Recent Labs Lab 04/16/15 0546 04/17/15 0529 04/18/15 0856 04/20/15 1037 04/21/15 0614  WBC 5.1 5.3 6.6 7.5 6.4  HGB 10.2* 9.5* 8.5* 7.2* 7.2*  HCT 29.4* 27.7* 25.2* 21.0* 20.9*  MCV 98.4 97.6 96.6 96.1 97.0  PLT 86* 69* 174 137* 107*      Microbiology:  Recent Results (from the past 720 hour(s))  MRSA PCR Screening     Status: None   Collection Time: 04/09/15  9:45 AM  Result Value Ref Range Status   MRSA by PCR NEGATIVE NEGATIVE Final    Comment:        The  GeneXpert MRSA Assay (FDA approved for NASAL specimens only), is one component of a comprehensive MRSA colonization surveillance program. It is not intended to diagnose MRSA infection nor to guide or monitor treatment for MRSA infections.   CULTURE, BLOOD (ROUTINE X 2) w Reflex to PCR ID Panel     Status: None   Collection Time: 04/14/15 11:51 AM  Result Value Ref Range Status   Specimen Description BLOOD RIGHT WRIST  Final   Special Requests BOTTLES DRAWN AEROBIC AND ANAEROBIC  10CC  Final   Culture NO GROWTH 5 DAYS  Final   Report Status 04/19/2015 FINAL  Final  CULTURE, BLOOD (ROUTINE X 2) w Reflex to PCR ID Panel     Status: None   Collection Time: 04/14/15 12:02 PM  Result Value Ref Range Status   Specimen Description BLOOD LEFT WRIST  Final   Special Requests   Final    BOTTLES DRAWN AEROBIC AND ANAEROBIC  AER 10CC ANA 5CC   Culture NO GROWTH 5 DAYS  Final   Report Status  04/19/2015 FINAL  Final  Surgical pcr screen     Status: None   Collection Time: 04/17/15  5:56 AM  Result Value Ref Range Status   MRSA, PCR NEGATIVE NEGATIVE Final   Staphylococcus aureus NEGATIVE NEGATIVE Final    Comment:        The Xpert SA Assay (FDA approved for NASAL specimens in patients over 57 years of age), is one component of a comprehensive surveillance program.  Test performance has been validated by [facility] for patients greater than or equal to 31 year old. It is not intended to diagnose infection nor to guide or monitor treatment.   Anaerobic culture     Status: None (Preliminary result)   Collection Time: 04/17/15  8:52 AM  Result Value Ref Range Status   Specimen Description GROIN  Final   Special Requests NONE  Final   Culture NO GROWTH 3 DAYS  Final   Report Status PENDING  Incomplete  Wound culture     Status: None   Collection Time: 04/17/15  8:52 AM  Result Value Ref Range Status   Specimen Description GROIN  Final   Special Requests NONE  Final   Gram Stain  FEW WBC SEEN NO ORGANISMS SEEN   Final   Culture No growth aerobically or anaerobically.  Final   Report Status 04/21/2015 FINAL  Final    Coagulation Studies: No results for input(s): LABPROT, INR in the last 72 hours.  Urinalysis: No results for input(s): COLORURINE, LABSPEC, PHURINE, GLUCOSEU, HGBUR, BILIRUBINUR, KETONESUR, PROTEINUR, UROBILINOGEN, NITRITE, LEUKOCYTESUR in the last 72 hours.  Invalid input(s): APPERANCEUR    Imaging: No results found.   Medications:     . sodium chloride   Intravenous Once  . amLODipine  5 mg Oral Daily  . antiseptic oral rinse  7 mL Mouth Rinse q12n4p  . atorvastatin  40 mg Oral Daily  . budesonide-formoterol  2 puff Inhalation BID  . chlorhexidine  15 mL Mouth Rinse BID  . Chlorhexidine Gluconate Cloth  6 each Topical Q0600  . epoetin (EPOGEN/PROCRIT) injection  10,000 Units Intravenous Q T,Th,Sa-HD  . famotidine  20 mg Oral Daily  . fluticasone  2 spray Each Nare Daily  . guaiFENesin  600 mg Oral BID  . ipratropium-albuterol  3 mL Nebulization TID  . irbesartan  75 mg Oral BID  . levothyroxine  88 mcg Oral Daily  . lidocaine  1 patch Transdermal Q24H  . metoprolol  50 mg Oral 3 times per day  . multivitamin  1 tablet Oral Daily  . senna-docusate  2 tablet Oral BID  . sodium chloride  1 spray Each Nare 5 X Daily  . sodium chloride  3 mL Intravenous Q12H  . sodium chloride  3 mL Intravenous Q12H   sodium chloride, sodium chloride, sodium chloride, acetaminophen, alteplase, chlorpheniramine-HYDROcodone, guaiFENesin-dextromethorphan, ipratropium-albuterol, morphine injection, ondansetron **OR** ondansetron (ZOFRAN) IV, ondansetron, oxyCODONE-acetaminophen, sodium chloride  Assessment/ Plan:  65 y.o. female with a PMHX of medical problems of COPD, hypertension, hyperlipidemia, vaginal intraepithelial neoplasia, emergent AAA repair status post rupture on November 18, left to right femoral to femoral bypass , acute renal failure leading  to permanent dialysis, significant 3 vessel disease, was admitted on 04/09/2015 with severe shortness of breath after missing dialysis due to weather conditions.   1. End-stage renal disease. [place]. TTS. [facility] nephrology - Patient completed dialysis yesterday. No acute indication for dialysis today. Next dialysis on Tuesday if still here.  2. Acute pulmonary edema,  requiring NIPPV earlier in admission. -Resolved with dialysis and ultrafiltration. Continue to monitor for signs of pulmonary edema.  3. Peripheral edema Significantly improved from admission. Continue ultrafiltration with dialysis.Marland Kitchen  4. Anemia of chronic kidney disease  -Hemoglobin low but stable at 7.2. Continue Epogen with dialysis. Consider transfusion for hemoglobin of 7 or less.  5. Bilateral fluid collection surrounding the anastomoses of the femoral to femoral bypass. - bilateral groin reexploration with bilateral seroma drainage and wound vac placement 04/17/15.    LOS: 12 Keelie Zemanek 1/22/20171:51 PM

## 2015-04-21 NOTE — Progress Notes (Signed)
SUBJECTIVE: Patient is feeling weak after dialysis   Filed Vitals:   04/20/15 2135 04/21/15 0607 04/21/15 0806 04/21/15 0900  BP: 116/61 135/62    Pulse: 92 75    Temp: 98.4 F (36.9 C) 98.2 F (36.8 C)    TempSrc: Oral Oral    Resp: 19 16    Height:      Weight:      SpO2: 98% 100% 98% 98%    Intake/Output Summary (Last 24 hours) at 04/21/15 1224 Last data filed at 04/21/15 0800  Gross per 24 hour  Intake    120 ml  Output   1500 ml  Net  -1380 ml    LABS: Basic Metabolic Panel:  Recent Labs  04/20/15 1037  NA 136  K 3.7  CL 97*  CO2 30  GLUCOSE 102*  BUN 35*  CREATININE 4.61*  CALCIUM 8.2*  PHOS 5.3*   Liver Function Tests:  Recent Labs  04/20/15 1037  ALBUMIN 2.2*   No results for input(s): LIPASE, AMYLASE in the last 72 hours. CBC:  Recent Labs  04/20/15 1037 04/21/15 0614  WBC 7.5 6.4  HGB 7.2* 7.2*  HCT 21.0* 20.9*  MCV 96.1 97.0  PLT 137* 107*   Cardiac Enzymes: No results for input(s): CKTOTAL, CKMB, CKMBINDEX, TROPONINI in the last 72 hours. BNP: Invalid input(s): POCBNP D-Dimer: No results for input(s): DDIMER in the last 72 hours. Hemoglobin A1C: No results for input(s): HGBA1C in the last 72 hours. Fasting Lipid Panel: No results for input(s): CHOL, HDL, LDLCALC, TRIG, CHOLHDL, LDLDIRECT in the last 72 hours. Thyroid Function Tests: No results for input(s): TSH, T4TOTAL, T3FREE, THYROIDAB in the last 72 hours.  Invalid input(s): FREET3 Anemia Panel: No results for input(s): VITAMINB12, FOLATE, FERRITIN, TIBC, IRON, RETICCTPCT in the last 72 hours.   PHYSICAL EXAM General: Well developed, well nourished, in no acute distress HEENT:  Normocephalic and atramatic Neck:  No JVD.  Lungs: Clear bilaterally to auscultation and percussion. Heart: HRRR . Normal S1 and S2 without gallops or murmurs.  Abdomen: Bowel sounds are positive, abdomen soft and non-tender  Msk:  Back normal, normal gait. Normal strength and tone for  age. Extremities: No clubbing, cyanosis or edema.   Neuro: Alert and oriented X 3. Psych:  Good affect, responds appropriately  TELEMETRY: Sinus rhythm  ASSESSMENT AND PLAN: CHF with diastolic dysfunction and renal failure. Patient is feeling weak after dialysis but denies any chest pain or shortness of breath.  Principal Problem:   Acute pulmonary edema with congestive heart failure (HCC) Active Problems:   Leukocytosis   Malignant essential hypertension   Dysphagia   Elevated troponin   Acute pulmonary edema (HCC)   Malnutrition of moderate degree    Sydny Schnitzler A, MD, [facility] 04/21/2015 12:24 PM

## 2015-04-22 DIAGNOSIS — R6520 Severe sepsis without septic shock: Secondary | ICD-10-CM | POA: Diagnosis not present

## 2015-04-22 DIAGNOSIS — J310 Chronic rhinitis: Secondary | ICD-10-CM | POA: Diagnosis not present

## 2015-04-22 DIAGNOSIS — D72829 Elevated white blood cell count, unspecified: Secondary | ICD-10-CM | POA: Diagnosis not present

## 2015-04-22 DIAGNOSIS — Y92129 Unspecified place in nursing home as the place of occurrence of the external cause: Secondary | ICD-10-CM | POA: Diagnosis not present

## 2015-04-22 DIAGNOSIS — R0600 Dyspnea, unspecified: Secondary | ICD-10-CM | POA: Diagnosis not present

## 2015-04-22 DIAGNOSIS — E039 Hypothyroidism, unspecified: Secondary | ICD-10-CM | POA: Diagnosis not present

## 2015-04-22 DIAGNOSIS — R739 Hyperglycemia, unspecified: Secondary | ICD-10-CM | POA: Diagnosis present

## 2015-04-22 DIAGNOSIS — E539 Vitamin B deficiency, unspecified: Secondary | ICD-10-CM | POA: Diagnosis not present

## 2015-04-22 DIAGNOSIS — R262 Difficulty in walking, not elsewhere classified: Secondary | ICD-10-CM | POA: Diagnosis not present

## 2015-04-22 DIAGNOSIS — J181 Lobar pneumonia, unspecified organism: Secondary | ICD-10-CM | POA: Diagnosis not present

## 2015-04-22 DIAGNOSIS — L7634 Postprocedural seroma of skin and subcutaneous tissue following other procedure: Secondary | ICD-10-CM | POA: Diagnosis present

## 2015-04-22 DIAGNOSIS — T814XXA Infection following a procedure, initial encounter: Secondary | ICD-10-CM | POA: Diagnosis present

## 2015-04-22 DIAGNOSIS — J90 Pleural effusion, not elsewhere classified: Secondary | ICD-10-CM | POA: Diagnosis not present

## 2015-04-22 DIAGNOSIS — I10 Essential (primary) hypertension: Secondary | ICD-10-CM | POA: Diagnosis not present

## 2015-04-22 DIAGNOSIS — I713 Abdominal aortic aneurysm, ruptured: Secondary | ICD-10-CM | POA: Diagnosis not present

## 2015-04-22 DIAGNOSIS — R0602 Shortness of breath: Secondary | ICD-10-CM | POA: Diagnosis not present

## 2015-04-22 DIAGNOSIS — Z823 Family history of stroke: Secondary | ICD-10-CM | POA: Diagnosis not present

## 2015-04-22 DIAGNOSIS — Z992 Dependence on renal dialysis: Secondary | ICD-10-CM | POA: Diagnosis not present

## 2015-04-22 DIAGNOSIS — Y841 Kidney dialysis as the cause of abnormal reaction of the patient, or of later complication, without mention of misadventure at the time of the procedure: Secondary | ICD-10-CM | POA: Diagnosis not present

## 2015-04-22 DIAGNOSIS — A4152 Sepsis due to Pseudomonas: Secondary | ICD-10-CM | POA: Diagnosis present

## 2015-04-22 DIAGNOSIS — J189 Pneumonia, unspecified organism: Secondary | ICD-10-CM | POA: Diagnosis not present

## 2015-04-22 DIAGNOSIS — N185 Chronic kidney disease, stage 5: Secondary | ICD-10-CM | POA: Diagnosis not present

## 2015-04-22 DIAGNOSIS — Y832 Surgical operation with anastomosis, bypass or graft as the cause of abnormal reaction of the patient, or of later complication, without mention of misadventure at the time of the procedure: Secondary | ICD-10-CM | POA: Diagnosis present

## 2015-04-22 DIAGNOSIS — E876 Hypokalemia: Secondary | ICD-10-CM | POA: Diagnosis present

## 2015-04-22 DIAGNOSIS — R0902 Hypoxemia: Secondary | ICD-10-CM | POA: Diagnosis present

## 2015-04-22 DIAGNOSIS — I739 Peripheral vascular disease, unspecified: Secondary | ICD-10-CM | POA: Diagnosis present

## 2015-04-22 DIAGNOSIS — J811 Chronic pulmonary edema: Secondary | ICD-10-CM | POA: Diagnosis not present

## 2015-04-22 DIAGNOSIS — I5033 Acute on chronic diastolic (congestive) heart failure: Secondary | ICD-10-CM | POA: Diagnosis not present

## 2015-04-22 DIAGNOSIS — I714 Abdominal aortic aneurysm, without rupture: Secondary | ICD-10-CM | POA: Diagnosis not present

## 2015-04-22 DIAGNOSIS — Z1621 Resistance to vancomycin: Secondary | ICD-10-CM | POA: Diagnosis present

## 2015-04-22 DIAGNOSIS — Z23 Encounter for immunization: Secondary | ICD-10-CM | POA: Diagnosis not present

## 2015-04-22 DIAGNOSIS — R062 Wheezing: Secondary | ICD-10-CM | POA: Diagnosis not present

## 2015-04-22 DIAGNOSIS — T829XXA Unspecified complication of cardiac and vascular prosthetic device, implant and graft, initial encounter: Secondary | ICD-10-CM

## 2015-04-22 DIAGNOSIS — J9601 Acute respiratory failure with hypoxia: Secondary | ICD-10-CM | POA: Diagnosis not present

## 2015-04-22 DIAGNOSIS — I890 Lymphedema, not elsewhere classified: Secondary | ICD-10-CM | POA: Diagnosis not present

## 2015-04-22 DIAGNOSIS — Z87898 Personal history of other specified conditions: Secondary | ICD-10-CM

## 2015-04-22 DIAGNOSIS — N186 End stage renal disease: Secondary | ICD-10-CM | POA: Diagnosis not present

## 2015-04-22 DIAGNOSIS — D7582 Heparin induced thrombocytopenia (HIT): Secondary | ICD-10-CM

## 2015-04-22 DIAGNOSIS — J441 Chronic obstructive pulmonary disease with (acute) exacerbation: Secondary | ICD-10-CM | POA: Diagnosis not present

## 2015-04-22 DIAGNOSIS — R7989 Other specified abnormal findings of blood chemistry: Secondary | ICD-10-CM | POA: Diagnosis not present

## 2015-04-22 DIAGNOSIS — R509 Fever, unspecified: Secondary | ICD-10-CM | POA: Diagnosis not present

## 2015-04-22 DIAGNOSIS — G894 Chronic pain syndrome: Secondary | ICD-10-CM | POA: Diagnosis not present

## 2015-04-22 DIAGNOSIS — E538 Deficiency of other specified B group vitamins: Secondary | ICD-10-CM | POA: Diagnosis present

## 2015-04-22 DIAGNOSIS — Z8249 Family history of ischemic heart disease and other diseases of the circulatory system: Secondary | ICD-10-CM | POA: Diagnosis not present

## 2015-04-22 DIAGNOSIS — Y95 Nosocomial condition: Secondary | ICD-10-CM | POA: Diagnosis present

## 2015-04-22 DIAGNOSIS — J690 Pneumonitis due to inhalation of food and vomit: Secondary | ICD-10-CM | POA: Diagnosis not present

## 2015-04-22 DIAGNOSIS — M6281 Muscle weakness (generalized): Secondary | ICD-10-CM | POA: Diagnosis not present

## 2015-04-22 DIAGNOSIS — I959 Hypotension, unspecified: Secondary | ICD-10-CM | POA: Diagnosis present

## 2015-04-22 DIAGNOSIS — D75829 Heparin-induced thrombocytopenia, unspecified: Secondary | ICD-10-CM

## 2015-04-22 DIAGNOSIS — E8770 Fluid overload, unspecified: Secondary | ICD-10-CM | POA: Diagnosis not present

## 2015-04-22 DIAGNOSIS — D509 Iron deficiency anemia, unspecified: Secondary | ICD-10-CM | POA: Diagnosis not present

## 2015-04-22 DIAGNOSIS — D696 Thrombocytopenia, unspecified: Secondary | ICD-10-CM

## 2015-04-22 DIAGNOSIS — D638 Anemia in other chronic diseases classified elsewhere: Secondary | ICD-10-CM

## 2015-04-22 DIAGNOSIS — R1310 Dysphagia, unspecified: Secondary | ICD-10-CM | POA: Diagnosis not present

## 2015-04-22 DIAGNOSIS — Z7982 Long term (current) use of aspirin: Secondary | ICD-10-CM | POA: Diagnosis not present

## 2015-04-22 DIAGNOSIS — E785 Hyperlipidemia, unspecified: Secondary | ICD-10-CM | POA: Diagnosis not present

## 2015-04-22 DIAGNOSIS — I120 Hypertensive chronic kidney disease with stage 5 chronic kidney disease or end stage renal disease: Secondary | ICD-10-CM | POA: Diagnosis not present

## 2015-04-22 DIAGNOSIS — T148 Other injury of unspecified body region: Secondary | ICD-10-CM | POA: Diagnosis not present

## 2015-04-22 DIAGNOSIS — R531 Weakness: Secondary | ICD-10-CM

## 2015-04-22 DIAGNOSIS — Z87891 Personal history of nicotine dependence: Secondary | ICD-10-CM | POA: Diagnosis not present

## 2015-04-22 DIAGNOSIS — D631 Anemia in chronic kidney disease: Secondary | ICD-10-CM | POA: Diagnosis not present

## 2015-04-22 DIAGNOSIS — K219 Gastro-esophageal reflux disease without esophagitis: Secondary | ICD-10-CM | POA: Diagnosis present

## 2015-04-22 DIAGNOSIS — T888XXA Other specified complications of surgical and medical care, not elsewhere classified, initial encounter: Secondary | ICD-10-CM | POA: Diagnosis not present

## 2015-04-22 DIAGNOSIS — J80 Acute respiratory distress syndrome: Secondary | ICD-10-CM | POA: Diagnosis not present

## 2015-04-22 DIAGNOSIS — Z8049 Family history of malignant neoplasm of other genital organs: Secondary | ICD-10-CM | POA: Diagnosis not present

## 2015-04-22 DIAGNOSIS — A419 Sepsis, unspecified organism: Secondary | ICD-10-CM | POA: Diagnosis not present

## 2015-04-22 DIAGNOSIS — I132 Hypertensive heart and chronic kidney disease with heart failure and with stage 5 chronic kidney disease, or end stage renal disease: Secondary | ICD-10-CM | POA: Diagnosis present

## 2015-04-22 DIAGNOSIS — Y848 Other medical procedures as the cause of abnormal reaction of the patient, or of later complication, without mention of misadventure at the time of the procedure: Secondary | ICD-10-CM | POA: Diagnosis present

## 2015-04-22 DIAGNOSIS — J449 Chronic obstructive pulmonary disease, unspecified: Secondary | ICD-10-CM | POA: Diagnosis present

## 2015-04-22 DIAGNOSIS — N2581 Secondary hyperparathyroidism of renal origin: Secondary | ICD-10-CM | POA: Diagnosis not present

## 2015-04-22 DIAGNOSIS — R609 Edema, unspecified: Secondary | ICD-10-CM | POA: Diagnosis not present

## 2015-04-22 DIAGNOSIS — R601 Generalized edema: Secondary | ICD-10-CM

## 2015-04-22 DIAGNOSIS — J810 Acute pulmonary edema: Secondary | ICD-10-CM | POA: Diagnosis not present

## 2015-04-22 LAB — CBC
HEMATOCRIT: 28 % — AB (ref 35.0–47.0)
Hemoglobin: 9.7 g/dL — ABNORMAL LOW (ref 12.0–16.0)
MCH: 33.2 pg (ref 26.0–34.0)
MCHC: 34.7 g/dL (ref 32.0–36.0)
MCV: 95.7 fL (ref 80.0–100.0)
PLATELETS: 158 10*3/uL (ref 150–440)
RBC: 2.93 MIL/uL — ABNORMAL LOW (ref 3.80–5.20)
RDW: 16.3 % — AB (ref 11.5–14.5)
WBC: 9.9 10*3/uL (ref 3.6–11.0)

## 2015-04-22 LAB — TYPE AND SCREEN
ABO/RH(D): B POS
Antibody Screen: NEGATIVE
UNIT DIVISION: 0

## 2015-04-22 MED ORDER — METOPROLOL TARTRATE 50 MG PO TABS: 50.0000 mg | ORAL_TABLET | Freq: Three times a day (TID) | ORAL | Status: DC

## 2015-04-22 MED ORDER — GUAIFENESIN-DM 100-10 MG/5ML PO SYRP: 10.0000 mL | ORAL_SOLUTION | ORAL | Status: DC | PRN

## 2015-04-22 MED ORDER — HYDROCOD POLST-CPM POLST ER 10-8 MG/5ML PO SUER: 5.0000 mL | Freq: Two times a day (BID) | ORAL | Status: DC | PRN

## 2015-04-22 MED ORDER — IRBESARTAN 75 MG PO TABS: 75.0000 mg | ORAL_TABLET | Freq: Two times a day (BID) | ORAL | Status: DC

## 2015-04-22 MED ORDER — AMLODIPINE BESYLATE 5 MG PO TABS: 5.0000 mg | ORAL_TABLET | Freq: Every day | ORAL | Status: DC

## 2015-04-22 NOTE — Discharge Summary (Signed)
[facility] [place] NAME: [name]    MR#:  [id number]  DATE OF BIRTH:  Feb 10, 1951  DATE OF ADMISSION:  04/09/2015 ADMITTING PHYSICIAN: Theodoro Grist, MD  DATE OF DISCHARGE: No discharge date for patient encounter.  PRIMARY CARE PHYSICIAN: Arnette Norris, MD     ADMISSION DIAGNOSIS:  Acute pulmonary edema (HCC) [J81.0] Respiratory distress [R06.00] Swelling of right lower extremity [M79.89] Dysphagia [R13.10]  DISCHARGE DIAGNOSIS:  Principal Problem:   Acute pulmonary edema with congestive heart failure (HCC) Active Problems:   Acute diastolic CHF (congestive heart failure) (HCC)   Malignant essential hypertension   Dysphagia   Elevated troponin   Leukocytosis   Malnutrition of moderate degree   Bilateral pleural effusion   Anasarca   Seroma complicating procedure   Thrombocytopenia (HCC)   Anemia of chronic disease   H/O blood transfusion reaction   HIT (heparin-induced thrombocytopenia) (HCC)   Generalized weakness   SECONDARY DIAGNOSIS:   Past Medical History  Diagnosis Date  . Hyperlipidemia   . Hypertension   . Thyroid disease   . GERD (gastroesophageal reflux disease)   . Aneurysm ([place])     abdominal aortic  . STD (sexually transmitted disease)     chlamydia  . VAIN II (vaginal intraepithelial neoplasia grade II) 7/09  . 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 B12 deficiency   . COPD (chronic obstructive pulmonary disease) ([place])   . Renal insufficiency   . Cancer ([place])     skin on left ankle 04/10/15 pt states she has never had cancer    .pro HOSPITAL COURSE:   The patient is a 65 year old Caucasian female with past medical history significant for history of hypertension, hyperlipidemia, hypothyroidism, abdominal aortic aneurysm rupture and repair who presents to the hospitals complains of shortness of breath for 3 days. On arrival to the hospital patient was noted to have mild  leukocytosis, elevated troponin to 0.07 , elevated blood pressure. CT scan of abdomen and pelvis with contrast was performed on January 11, revealing low density fluid collection laterally. It inguinal region close to femoral-femoral crossover bypass. Chest x-ray showed increased interstitial prominence and emphysema. She was admitted to the hospital for further evaluation and treatment. Consultation with nephrologist was obtained and patient was initiated on NIPPV earlier in admission. Acute pulmonary edema with dialysis and ultrafiltration resolved. Patient was evaluated by vascular surgeon and underwent triple lumen central venous catheter insertion in left IJ and incision and drainage of right groin seroma,  rotation of sartorius muscle flap for coverage of bypass graft, also incision and drainage with excisional debridement left groin seroma and rotation of sartorius muscle flap for coverage of bypass graft, left side. Wound cultures were taken and they remained negative. No antibiotic was given while patient was in the hospital. Patient had wound VAC placed and progressively did better. While in the hospital patient was noted to have low platelet count, HIT testing was performed and was found to be abnormal, concerning for heparin-induced thrombocytopenia, patient was recommended not to ever use heparin or related products. Patient was also noted to be anemic postoperatively with hemoglobin level of 7.2 on the 21st as well as 22nd of January, after which patient was transfused packed red blood cells and her hemoglobin level improved. Patient was evaluated by physical therapist and recommended to return back to skilled nursing facility for rehabilitation. Discussion by the problem #1 acute on chronic hypoxic respiratory failure-due to  a combination of diastolic CHF and underlying COPD. -Patient has had hemodialysis for fluid removal and has clinically improved. Weaned of O2 , remains off oxygen and has good  oxygen saturations of 94-96% on room air  #2 malignant essential hypertension-blood pressures improved and remained stable.  - Continue metoprolol, Avapro, Norvasc.   #3 hypothyroidism-continue Synthroid.  #4 end-stage renal disease on hemodialysis-nephrology following. Continue dialysis on Tuesday Thursday Saturday. AV fistula formation will be performed by Dr. Delana Meyer as outpatient  #5 leukocytosis/low-grade fever-suspected to be secondary to the fluid collection status post femorofemoral bypass, now drained and wound VAC is placed. Patient is clinically stable, afebrile and hemodynamically stable. - s/p b/l I & D of Groin Seroma's POD # 5. Fluid culture (-) so far.  - cont. Further care as per Vascular surgery. Vascular surgery changed dressing and wound vac 04/20/15, discharge to skilled nursing facility for rehabilitation on Monday. The patient is to follow-up with vascular surgery as outpatient  #6 COPD-without acute exacerbation. -Continue Symbicort, DuoNeb nebs as needed.  #7 anemia of chronic disease-secondary to end-stage renal disease. Continue EPO with dialysis. - Hg. is low  yesterday, however, no bleeding apart from wound VAC losses were noted, status post 1 unit of packed blood cells transfusion generated 22nd, multiple level has improved to 9.7, follow-up as outpatient  #8. Generalized weakness, PT eval., skilled nursing facility was recommended   #9. Thrombocytopenia, likely heparin-induced thrombocytopenia, heparin is discontinued and patient's platelet count normalized to 158,000 today  #10. Elevated troponin, patient was seen by cardiologist. Echocardiogram was performed which was unremarkable except of mild aortic regurgitation and mild mitral regurgitation, diastolic dysfunction. Patient was recommended to follow up with cardiologist as outpatient.  DISCHARGE CONDITIONS:   Stable  CONSULTS OBTAINED:  Treatment Team:  Dionisio David, MD Katha Cabal,  MD  DRUG ALLERGIES:   Allergies  Allergen Reactions  . Heparin Other (See Comments)    HIT antibody positive     DISCHARGE MEDICATIONS:   Current Discharge Medication List    START taking these medications   Details  amLODipine (NORVASC) 5 MG tablet Take 1 tablet (5 mg total) by mouth daily. Qty: 30 tablet, Refills: 6    chlorpheniramine-HYDROcodone (TUSSIONEX) 10-8 MG/5ML SUER Take 5 mLs by mouth every 12 (twelve) hours as needed for cough. Qty: 140 mL, Refills: 0    guaiFENesin-dextromethorphan (ROBITUSSIN DM) 100-10 MG/5ML syrup Take 10 mLs by mouth every 4 (four) hours as needed for cough. Qty: 118 mL, Refills: 0    irbesartan (AVAPRO) 75 MG tablet Take 1 tablet (75 mg total) by mouth 2 (two) times daily. Qty: 30 tablet, Refills: 5      CONTINUE these medications which have CHANGED   Details  metoprolol (LOPRESSOR) 50 MG tablet Take 1 tablet (50 mg total) by mouth every 8 (eight) hours. Qty: 90 tablet, Refills: 5      CONTINUE these medications which have NOT CHANGED   Details  acetaminophen (TYLENOL) 500 MG tablet Take 1,000 mg by mouth every 8 (eight) hours.    !! albuterol (PROVENTIL) (2.5 MG/3ML) 0.083% nebulizer solution Take 2.5 mg by nebulization every 6 (six) hours.    !! albuterol (PROVENTIL) (2.5 MG/3ML) 0.083% nebulizer solution Take 2.5 mg by nebulization every 6 (six) hours as needed for wheezing.    aspirin EC 81 MG tablet Take 81 mg by mouth daily.    atorvastatin (LIPITOR) 40 MG tablet Take 40 mg by mouth daily.    budesonide-formoterol (SYMBICORT) 80-4.5  MCG/ACT inhaler Inhale 2 puffs into the lungs 2 (two) times daily.    docusate sodium (COLACE) 100 MG capsule Take 100 mg by mouth 2 (two) times daily.     famotidine (PEPCID) 20 MG tablet Take 20 mg by mouth daily.    fluticasone (FLONASE) 50 MCG/ACT nasal spray Place 2 sprays into both nostrils daily.    hydrALAZINE (APRESOLINE) 10 MG tablet Take 10 mg by mouth every 6 (six) hours.     ipratropium (ATROVENT) 0.02 % nebulizer solution Take 0.5 mg by nebulization 4 (four) times daily.    levothyroxine (SYNTHROID, LEVOTHROID) 88 MCG tablet Take 88 mcg by mouth daily.    lidocaine (LIDODERM) 5 % Place 1 patch onto the skin daily. Remove & Discard patch within 12 hours or as directed by MD    magnesium hydroxide (MILK OF MAGNESIA) 400 MG/5ML suspension Take 30 mLs by mouth daily as needed for mild constipation.    multivitamin (RENA-VIT) TABS tablet Take 1 tablet by mouth daily.    ondansetron (ZOFRAN) 4 MG tablet Take 4 mg by mouth every 6 (six) hours as needed for nausea or vomiting.    promethazine (PHENERGAN) 25 MG/ML injection Inject 25 mg into the vein every 6 (six) hours as needed for nausea or vomiting.    sodium chloride (OCEAN) 0.65 % SOLN nasal spray Place 1 spray into both nostrils 5 (five) times daily.     !! - Potential duplicate medications found. Please discuss with provider.    STOP taking these medications     heparin 5000 UNIT/ML injection          DISCHARGE INSTRUCTIONS:    Patient is to follow-up with primary care physician, vascular surgery, cardiology,  If you experience worsening of your admission symptoms, develop shortness of breath, life threatening emergency, suicidal or homicidal thoughts you must seek medical attention immediately by calling 911 or calling your MD immediately  if symptoms less severe.  You Must read complete instructions/literature along with all the possible adverse reactions/side effects for all the Medicines you take and that have been prescribed to you. Take any new Medicines after you have completely understood and accept all the possible adverse reactions/side effects.   Please note  You were cared for by a hospitalist during your hospital stay. If you have any questions about your discharge medications or the care you received while you were in the hospital after you are discharged, you can call the unit and  asked to speak with the hospitalist on call if the hospitalist that took care of you is not available. Once you are discharged, your primary care physician will handle any further medical issues. Please note that NO REFILLS for any discharge medications will be authorized once you are discharged, as it is imperative that you return to your primary care physician (or establish a relationship with a primary care physician if you do not have one) for your aftercare needs so that they can reassess your need for medications and monitor your lab values.    Today   CHIEF COMPLAINT:   Chief Complaint  Patient presents with  . Respiratory Distress    HISTORY OF PRESENT ILLNESS:  [name]  is a 65 y.o. female with a known history of hypertension, hyperlipidemia, hypothyroidism, abdominal aortic aneurysm rupture and repair who presents to the hospitals complains of shortness of breath for 3 days. On arrival to the hospital patient was noted to have mild leukocytosis, elevated troponin to 0.07 , elevated  blood pressure. CT scan of abdomen and pelvis with contrast was performed on January 11, revealing low density fluid collection laterally. It inguinal region close to femoral-femoral crossover bypass. Chest x-ray showed increased interstitial prominence and emphysema. She was admitted to the hospital for further evaluation and treatment. Consultation with nephrologist was obtained and patient was initiated on NIPPV earlier in admission. Acute pulmonary edema with dialysis and ultrafiltration resolved. Patient was evaluated by vascular surgeon and underwent triple lumen central venous catheter insertion in left IJ and incision and drainage of right groin seroma,  rotation of sartorius muscle flap for coverage of bypass graft, also incision and drainage with excisional debridement left groin seroma and rotation of sartorius muscle flap for coverage of bypass graft, left side. Wound cultures were taken and  they remained negative. No antibiotic was given while patient was in the hospital. Patient had wound VAC placed and progressively did better. While in the hospital patient was noted to have low platelet count, HIT testing was performed and was found to be abnormal, concerning for heparin-induced thrombocytopenia, patient was recommended not to ever use heparin or related products. Patient was also noted to be anemic postoperatively with hemoglobin level of 7.2 on the 21st as well as 22nd of January, after which patient was transfused packed red blood cells and her hemoglobin level improved. Patient was evaluated by physical therapist and recommended to return back to skilled nursing facility for rehabilitation. Discussion by the problem #1 acute on chronic hypoxic respiratory failure-due to a combination of diastolic CHF and underlying COPD. -Patient has had hemodialysis for fluid removal and has clinically improved. Weaned of O2 , remains off oxygen and has good oxygen saturations of 94-96% on room air  #2 malignant essential hypertension-blood pressures improved and remained stable.  - Continue metoprolol, Avapro, Norvasc.   #3 hypothyroidism-continue Synthroid.  #4 end-stage renal disease on hemodialysis-nephrology following. Continue dialysis on Tuesday Thursday Saturday. AV fistula formation will be performed by Dr. Delana Meyer as outpatient  #5 leukocytosis/low-grade fever-suspected to be secondary to the fluid collection status post femorofemoral bypass, now drained and wound VAC is placed. Patient is clinically stable, afebrile and hemodynamically stable. - s/p b/l I & D of Groin Seroma's POD # 5. Fluid culture (-) so far.  - cont. Further care as per Vascular surgery. Vascular surgery changed dressing and wound vac 04/20/15, discharge to skilled nursing facility for rehabilitation on Monday. The patient is to follow-up with vascular surgery as outpatient  #6 COPD-without acute  exacerbation. -Continue Symbicort, DuoNeb nebs as needed.  #7 anemia of chronic disease-secondary to end-stage renal disease. Continue EPO with dialysis. - Hg. is low  yesterday, however, no bleeding apart from wound VAC losses were noted, status post 1 unit of packed blood cells transfusion generated 22nd, multiple level has improved to 9.7, follow-up as outpatient  #8. Generalized weakness, PT eval., skilled nursing facility was recommended   #9. Thrombocytopenia, likely heparin-induced thrombocytopenia, heparin is discontinued and patient's platelet count normalized to 158,000 today  #10. Elevated troponin, patient was seen by cardiologist. Echocardiogram was performed which was unremarkable except of mild aortic regurgitation and mild mitral regurgitation, diastolic dysfunction. Patient was recommended to follow up with cardiologist as outpatient.     VITAL SIGNS:  Blood pressure 149/70, pulse 78, temperature 98.4 F (36.9 C), temperature source Oral, resp. rate 19, height 5\' 4"  (1.626 m), weight 62.9 kg (138 lb 10.7 oz), last menstrual period 03/30/1988, SpO2 94 %.  I/O:   Intake/Output Summary (Last  24 hours) at 04/22/15 1054 Last data filed at 04/21/15 1700  Gross per 24 hour  Intake    800 ml  Output      0 ml  Net    800 ml    PHYSICAL EXAMINATION:  GENERAL:  65 y.o.-year-old patient lying in the bed with no acute distress.  EYES: Pupils equal, round, reactive to light and accommodation. No scleral icterus. Extraocular muscles intact.  HEENT: Head atraumatic, normocephalic. Oropharynx and nasopharynx clear.  NECK:  Supple, no jugular venous distention. No thyroid enlargement, no tenderness.  LUNGS: Normal breath sounds bilaterally, no wheezing, rales,rhonchi or crepitation. No use of accessory muscles of respiration.  CARDIOVASCULAR: S1, S2 normal. No murmurs, rubs, or gallops.  ABDOMEN: Soft, non-tender, non-distended. Bowel sounds present. No organomegaly or mass.   EXTREMITIES: No pedal edema, cyanosis, or clubbing.  NEUROLOGIC: Cranial nerves II through XII are intact. Muscle strength 5/5 in all extremities. Sensation intact. Gait not checked.  PSYCHIATRIC: The patient is alert and oriented x 3.  SKIN: No obvious rash, lesion, or ulcer.   DATA REVIEW:   CBC  Recent Labs Lab 04/22/15 0531  WBC 9.9  HGB 9.7*  HCT 28.0*  PLT 158    Chemistries   Recent Labs Lab 04/20/15 1037  NA 136  K 3.7  CL 97*  CO2 30  GLUCOSE 102*  BUN 35*  CREATININE 4.61*  CALCIUM 8.2*    Cardiac Enzymes No results for input(s): TROPONINI in the last 168 hours.  Microbiology Results  Results for orders placed or performed during the hospital encounter of 04/09/15  MRSA PCR Screening     Status: None   Collection Time: 04/09/15  9:45 AM  Result Value Ref Range Status   MRSA by PCR NEGATIVE NEGATIVE Final    Comment:        The GeneXpert MRSA Assay (FDA approved for NASAL specimens only), is one component of a comprehensive MRSA colonization surveillance program. It is not intended to diagnose MRSA infection nor to guide or monitor treatment for MRSA infections.   CULTURE, BLOOD (ROUTINE X 2) w Reflex to PCR ID Panel     Status: None   Collection Time: 04/14/15 11:51 AM  Result Value Ref Range Status   Specimen Description BLOOD RIGHT WRIST  Final   Special Requests BOTTLES DRAWN AEROBIC AND ANAEROBIC  10CC  Final   Culture NO GROWTH 5 DAYS  Final   Report Status 04/19/2015 FINAL  Final  CULTURE, BLOOD (ROUTINE X 2) w Reflex to PCR ID Panel     Status: None   Collection Time: 04/14/15 12:02 PM  Result Value Ref Range Status   Specimen Description BLOOD LEFT WRIST  Final   Special Requests   Final    BOTTLES DRAWN AEROBIC AND ANAEROBIC  AER 10CC ANA 5CC   Culture NO GROWTH 5 DAYS  Final   Report Status 04/19/2015 FINAL  Final  Surgical pcr screen     Status: None   Collection Time: 04/17/15  5:56 AM  Result Value Ref Range Status    MRSA, PCR NEGATIVE NEGATIVE Final   Staphylococcus aureus NEGATIVE NEGATIVE Final    Comment:        The Xpert SA Assay (FDA approved for NASAL specimens in patients over 36 years of age), is one component of a comprehensive surveillance program.  Test performance has been validated by [facility] for patients greater than or equal to 40 year old. It is not intended to  diagnose infection nor to guide or monitor treatment.   Anaerobic culture     Status: None (Preliminary result)   Collection Time: 04/17/15  8:52 AM  Result Value Ref Range Status   Specimen Description GROIN  Final   Special Requests NONE  Final   Culture NO GROWTH 3 DAYS  Final   Report Status PENDING  Incomplete  Wound culture     Status: None   Collection Time: 04/17/15  8:52 AM  Result Value Ref Range Status   Specimen Description GROIN  Final   Special Requests NONE  Final   Gram Stain FEW WBC SEEN NO ORGANISMS SEEN   Final   Culture No growth aerobically or anaerobically.  Final   Report Status 04/21/2015 FINAL  Final    RADIOLOGY:  No results found.  EKG:   Orders placed or performed during the hospital encounter of 04/09/15  . EKG 12-Lead  . EKG 12-Lead  . EKG 12-Lead  . EKG 12-Lead      Management plans discussed with the patient, family and they are in agreement.  CODE STATUS:     Code Status Orders        Start     Ordered   04/09/15 1024  Full code   Continuous     04/09/15 1024    Code Status History    Date Active Date Inactive Code Status Order ID Comments User Context   This patient has a current code status but no historical code status.      TOTAL TIME TAKING CARE OF THIS PATIENT: 40 minutes.    Theodoro Grist M.D on 04/22/2015 at 10:54 AM  Between 7am to 6pm - Pager - [phone]  After 6pm go to [email or web address] - password [email or web address] [facility]  Office  [phone]  CC: Primary care physician; Arnette Norris, MD

## 2015-04-22 NOTE — Progress Notes (Signed)
[facility] transported pt to facility. Central line to be removed. Concerns addressed.

## 2015-04-22 NOTE — Progress Notes (Signed)
Subjective:   Patient sitting in bed. HIT positive. Platelets 458K  Status post PRBC 1 unit transfusion yesterday.   Objective:  Vital signs in last 24 hours:  Temp:  [97.6 F (36.4 C)-98.4 F (36.9 C)] 98.4 F (36.9 C) (01/23 0516) Pulse Rate:  [78-88] 78 (01/23 0516) Resp:  [16-20] 19 (01/23 0516) BP: (110-149)/(50-70) 149/70 mmHg (01/23 0516) SpO2:  [94 %-98 %] 94 % (01/23 0739)  Weight change:  Filed Weights   04/16/15 1330 04/16/15 1419 04/20/15 1035  Weight: 54.5 kg (120 lb 2.4 oz) 51.2 kg (112 lb 14 oz) 62.9 kg (138 lb 10.7 oz)    Intake/Output:    Intake/Output Summary (Last 24 hours) at 04/22/15 1207 Last data filed at 04/21/15 1700  Gross per 24 hour  Intake    560 ml  Output      0 ml  Net    560 ml     Physical Exam: General: NAD  HEENT Anicteric, moist oral mucus membranes  Neck supple  Pulm/lungs Clear bilaterally, normal resp effort   CVS/Heart Irregular, no rub  Abdomen:  Soft, NT, +incision   Extremities: trace peripheral edema  Neurologic: Alert, follows commands  Skin: No acute rashes  Access: Rt IJ PC       Basic Metabolic Panel:   Recent Labs Lab 04/16/15 0546 04/17/15 0529 04/17/15 1534 04/20/15 1037  NA  --  138 136 136  K  --  3.0* 3.6 3.7  CL  --  101 100* 97*  CO2  --  30 28 30   GLUCOSE  --  90 105* 102*  BUN  --  22* 25* 35*  CREATININE  --  2.95* 3.37* 4.61*  CALCIUM  --  8.0* 8.0* 8.2*  PHOS 4.9*  --   --  5.3*     CBC:  Recent Labs Lab 04/17/15 0529 04/18/15 0856 04/20/15 1037 04/21/15 0614 04/22/15 0531  WBC 5.3 6.6 7.5 6.4 9.9  HGB 9.5* 8.5* 7.2* 7.2* 9.7*  HCT 27.7* 25.2* 21.0* 20.9* 28.0*  MCV 97.6 96.6 96.1 97.0 95.7  PLT 69* 174 137* 107* 158      Microbiology:  Recent Results (from the past 720 hour(s))  MRSA PCR Screening     Status: None   Collection Time: 04/09/15  9:45 AM  Result Value Ref Range Status   MRSA by PCR NEGATIVE NEGATIVE Final    Comment:        The GeneXpert MRSA  Assay (FDA approved for NASAL specimens only), is one component of a comprehensive MRSA colonization surveillance program. It is not intended to diagnose MRSA infection nor to guide or monitor treatment for MRSA infections.   CULTURE, BLOOD (ROUTINE X 2) w Reflex to PCR ID Panel     Status: None   Collection Time: 04/14/15 11:51 AM  Result Value Ref Range Status   Specimen Description BLOOD RIGHT WRIST  Final   Special Requests BOTTLES DRAWN AEROBIC AND ANAEROBIC  10CC  Final   Culture NO GROWTH 5 DAYS  Final   Report Status 04/19/2015 FINAL  Final  CULTURE, BLOOD (ROUTINE X 2) w Reflex to PCR ID Panel     Status: None   Collection Time: 04/14/15 12:02 PM  Result Value Ref Range Status   Specimen Description BLOOD LEFT WRIST  Final   Special Requests   Final    BOTTLES DRAWN AEROBIC AND ANAEROBIC  AER 10CC ANA 5CC   Culture NO GROWTH 5 DAYS  Final  Report Status 04/19/2015 FINAL  Final  Surgical pcr screen     Status: None   Collection Time: 04/17/15  5:56 AM  Result Value Ref Range Status   MRSA, PCR NEGATIVE NEGATIVE Final   Staphylococcus aureus NEGATIVE NEGATIVE Final    Comment:        The Xpert SA Assay (FDA approved for NASAL specimens in patients over 78 years of age), is one component of a comprehensive surveillance program.  Test performance has been validated by [facility] for patients greater than or equal to 50 year old. It is not intended to diagnose infection nor to guide or monitor treatment.   Anaerobic culture     Status: None (Preliminary result)   Collection Time: 04/17/15  8:52 AM  Result Value Ref Range Status   Specimen Description GROIN  Final   Special Requests NONE  Final   Culture NO GROWTH 3 DAYS  Final   Report Status PENDING  Incomplete  Wound culture     Status: None   Collection Time: 04/17/15  8:52 AM  Result Value Ref Range Status   Specimen Description GROIN  Final   Special Requests NONE  Final   Gram Stain FEW WBC SEEN NO  ORGANISMS SEEN   Final   Culture No growth aerobically or anaerobically.  Final   Report Status 04/21/2015 FINAL  Final    Coagulation Studies: No results for input(s): LABPROT, INR in the last 72 hours.  Urinalysis: No results for input(s): COLORURINE, LABSPEC, PHURINE, GLUCOSEU, HGBUR, BILIRUBINUR, KETONESUR, PROTEINUR, UROBILINOGEN, NITRITE, LEUKOCYTESUR in the last 72 hours.  Invalid input(s): APPERANCEUR    Imaging: No results found.   Medications:     . sodium chloride   Intravenous Once  . amLODipine  5 mg Oral Daily  . antiseptic oral rinse  7 mL Mouth Rinse q12n4p  . atorvastatin  40 mg Oral Daily  . budesonide-formoterol  2 puff Inhalation BID  . chlorhexidine  15 mL Mouth Rinse BID  . Chlorhexidine Gluconate Cloth  6 each Topical Q0600  . epoetin (EPOGEN/PROCRIT) injection  10,000 Units Intravenous Q T,Th,Sa-HD  . famotidine  20 mg Oral Daily  . fluticasone  2 spray Each Nare Daily  . guaiFENesin  600 mg Oral BID  . ipratropium-albuterol  3 mL Nebulization TID  . irbesartan  75 mg Oral BID  . levothyroxine  88 mcg Oral Daily  . lidocaine  1 patch Transdermal Q24H  . metoprolol  50 mg Oral 3 times per day  . multivitamin  1 tablet Oral Daily  . senna-docusate  2 tablet Oral BID  . sodium chloride  1 spray Each Nare 5 X Daily  . sodium chloride  3 mL Intravenous Q12H  . sodium chloride  3 mL Intravenous Q12H   sodium chloride, sodium chloride, sodium chloride, acetaminophen, alteplase, chlorpheniramine-HYDROcodone, guaiFENesin-dextromethorphan, ipratropium-albuterol, morphine injection, ondansetron **OR** ondansetron (ZOFRAN) IV, ondansetron, oxyCODONE-acetaminophen, sodium chloride  Assessment/ Plan:  65 y.o. female with COPD, hypertension, hyperlipidemia, vaginal intraepithelial neoplasia, emergent AAA repair status post rupture on November 18, left to right femoral to femoral bypass , acute renal failure leading to permanent dialysis, significant 3 vessel  disease, was admitted on 04/09/2015 with severe shortness of breath after missing dialysis due to weather conditions.  1. End-stage renal disease. [place]. TTS. [facility] nephrology - Continue TTS schedule. Next treatment for tomorrow.   2. Acute pulmonary edema, requiring NIPPV earlier in admission. -Resolved with dialysis and ultrafiltration. Continue to monitor for  signs of pulmonary edema.  3. Hypertension and peripheral edema: volume driven. Blood pressure now at goal - irbesartan, amlodipine, metoprolol  4. Anemia of chronic kidney disease: hemoglobin 9.7. Status post PRBC transfusion on 1/22.  - epo with treatment.   5. Secondary Hyperparathyroidism: PTH low at 47 on 1/17.  - currently not on a binder.   6. 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 Patient instructed to not get anymore platelet products. I have let [facility] know.     LOS: [place], [place] 1/23/201712:07 PM

## 2015-04-22 NOTE — Progress Notes (Signed)
Report called to [place], at [facility]. Pt IJ removed. Concerns addressed. EMS notified and are to pick pt up.

## 2015-04-22 NOTE — Care Management Important Message (Signed)
Important Message  Patient Details  Name: [name] MRN: [id number] Date of Birth: 04-Feb-1951   Medicare Important Message Given:  Yes    [name] 04/22/2015, 11:18 AM

## 2015-04-23 DIAGNOSIS — N186 End stage renal disease: Secondary | ICD-10-CM | POA: Diagnosis not present

## 2015-04-23 DIAGNOSIS — D509 Iron deficiency anemia, unspecified: Secondary | ICD-10-CM | POA: Diagnosis not present

## 2015-04-23 DIAGNOSIS — Z992 Dependence on renal dialysis: Secondary | ICD-10-CM | POA: Diagnosis not present

## 2015-04-24 LAB — ANAEROBIC CULTURE

## 2015-04-25 DIAGNOSIS — Z992 Dependence on renal dialysis: Secondary | ICD-10-CM | POA: Diagnosis not present

## 2015-04-25 DIAGNOSIS — N186 End stage renal disease: Secondary | ICD-10-CM | POA: Diagnosis not present

## 2015-04-25 DIAGNOSIS — D509 Iron deficiency anemia, unspecified: Secondary | ICD-10-CM | POA: Diagnosis not present

## 2015-04-26 DIAGNOSIS — I713 Abdominal aortic aneurysm, ruptured: Secondary | ICD-10-CM | POA: Diagnosis not present

## 2015-04-26 DIAGNOSIS — E8770 Fluid overload, unspecified: Secondary | ICD-10-CM | POA: Diagnosis not present

## 2015-04-26 DIAGNOSIS — Z992 Dependence on renal dialysis: Secondary | ICD-10-CM | POA: Diagnosis not present

## 2015-04-26 DIAGNOSIS — N186 End stage renal disease: Secondary | ICD-10-CM | POA: Diagnosis not present

## 2015-04-27 DIAGNOSIS — D509 Iron deficiency anemia, unspecified: Secondary | ICD-10-CM | POA: Diagnosis not present

## 2015-04-27 DIAGNOSIS — N186 End stage renal disease: Secondary | ICD-10-CM | POA: Diagnosis not present

## 2015-04-27 DIAGNOSIS — Z992 Dependence on renal dialysis: Secondary | ICD-10-CM | POA: Diagnosis not present

## 2015-04-30 DIAGNOSIS — N186 End stage renal disease: Secondary | ICD-10-CM | POA: Diagnosis not present

## 2015-04-30 DIAGNOSIS — D509 Iron deficiency anemia, unspecified: Secondary | ICD-10-CM | POA: Diagnosis not present

## 2015-04-30 DIAGNOSIS — Z992 Dependence on renal dialysis: Secondary | ICD-10-CM | POA: Diagnosis not present

## 2015-05-01 DIAGNOSIS — Z992 Dependence on renal dialysis: Secondary | ICD-10-CM | POA: Diagnosis not present

## 2015-05-01 DIAGNOSIS — N186 End stage renal disease: Secondary | ICD-10-CM | POA: Diagnosis not present

## 2015-05-02 DIAGNOSIS — Z992 Dependence on renal dialysis: Secondary | ICD-10-CM | POA: Diagnosis not present

## 2015-05-02 DIAGNOSIS — D509 Iron deficiency anemia, unspecified: Secondary | ICD-10-CM | POA: Diagnosis not present

## 2015-05-02 DIAGNOSIS — Z23 Encounter for immunization: Secondary | ICD-10-CM | POA: Diagnosis not present

## 2015-05-02 DIAGNOSIS — E539 Vitamin B deficiency, unspecified: Secondary | ICD-10-CM | POA: Diagnosis not present

## 2015-05-02 DIAGNOSIS — N186 End stage renal disease: Secondary | ICD-10-CM | POA: Diagnosis not present

## 2015-05-03 ENCOUNTER — Ambulatory Visit: Payer: Medicare Other | Admitting: Family

## 2015-05-03 ENCOUNTER — Telehealth: Payer: Self-pay | Admitting: Family

## 2015-05-03 DIAGNOSIS — Z992 Dependence on renal dialysis: Secondary | ICD-10-CM | POA: Diagnosis not present

## 2015-05-03 DIAGNOSIS — N186 End stage renal disease: Secondary | ICD-10-CM | POA: Diagnosis not present

## 2015-05-03 NOTE — Telephone Encounter (Signed)
Patient did not show for her initial appointment at the [facility] on 05/03/15. Will attempt to reschedule.

## 2015-05-04 DIAGNOSIS — Z992 Dependence on renal dialysis: Secondary | ICD-10-CM | POA: Diagnosis not present

## 2015-05-04 DIAGNOSIS — N186 End stage renal disease: Secondary | ICD-10-CM | POA: Diagnosis not present

## 2015-05-04 DIAGNOSIS — D509 Iron deficiency anemia, unspecified: Secondary | ICD-10-CM | POA: Diagnosis not present

## 2015-05-04 DIAGNOSIS — E539 Vitamin B deficiency, unspecified: Secondary | ICD-10-CM | POA: Diagnosis not present

## 2015-05-04 DIAGNOSIS — Z23 Encounter for immunization: Secondary | ICD-10-CM | POA: Diagnosis not present

## 2015-05-05 DIAGNOSIS — N186 End stage renal disease: Secondary | ICD-10-CM | POA: Diagnosis not present

## 2015-05-05 DIAGNOSIS — Z992 Dependence on renal dialysis: Secondary | ICD-10-CM | POA: Diagnosis not present

## 2015-05-06 DIAGNOSIS — N186 End stage renal disease: Secondary | ICD-10-CM | POA: Diagnosis not present

## 2015-05-06 DIAGNOSIS — Y841 Kidney dialysis as the cause of abnormal reaction of the patient, or of later complication, without mention of misadventure at the time of the procedure: Secondary | ICD-10-CM | POA: Diagnosis not present

## 2015-05-06 DIAGNOSIS — E785 Hyperlipidemia, unspecified: Secondary | ICD-10-CM | POA: Diagnosis not present

## 2015-05-06 DIAGNOSIS — D72829 Elevated white blood cell count, unspecified: Secondary | ICD-10-CM | POA: Diagnosis not present

## 2015-05-06 DIAGNOSIS — I890 Lymphedema, not elsewhere classified: Secondary | ICD-10-CM | POA: Diagnosis not present

## 2015-05-06 DIAGNOSIS — R509 Fever, unspecified: Secondary | ICD-10-CM | POA: Diagnosis not present

## 2015-05-06 DIAGNOSIS — N185 Chronic kidney disease, stage 5: Secondary | ICD-10-CM | POA: Diagnosis not present

## 2015-05-06 DIAGNOSIS — T888XXA Other specified complications of surgical and medical care, not elsewhere classified, initial encounter: Secondary | ICD-10-CM | POA: Diagnosis not present

## 2015-05-06 DIAGNOSIS — I714 Abdominal aortic aneurysm, without rupture: Secondary | ICD-10-CM | POA: Diagnosis not present

## 2015-05-06 DIAGNOSIS — Z992 Dependence on renal dialysis: Secondary | ICD-10-CM | POA: Diagnosis not present

## 2015-05-07 DIAGNOSIS — N186 End stage renal disease: Secondary | ICD-10-CM | POA: Diagnosis not present

## 2015-05-07 DIAGNOSIS — Z23 Encounter for immunization: Secondary | ICD-10-CM | POA: Diagnosis not present

## 2015-05-07 DIAGNOSIS — D509 Iron deficiency anemia, unspecified: Secondary | ICD-10-CM | POA: Diagnosis not present

## 2015-05-07 DIAGNOSIS — E539 Vitamin B deficiency, unspecified: Secondary | ICD-10-CM | POA: Diagnosis not present

## 2015-05-07 DIAGNOSIS — Z992 Dependence on renal dialysis: Secondary | ICD-10-CM | POA: Diagnosis not present

## 2015-05-08 ENCOUNTER — Other Ambulatory Visit: Payer: Self-pay | Admitting: Vascular Surgery

## 2015-05-08 DIAGNOSIS — N186 End stage renal disease: Secondary | ICD-10-CM | POA: Diagnosis not present

## 2015-05-08 DIAGNOSIS — Z992 Dependence on renal dialysis: Secondary | ICD-10-CM | POA: Diagnosis not present

## 2015-05-09 DIAGNOSIS — Z23 Encounter for immunization: Secondary | ICD-10-CM | POA: Diagnosis not present

## 2015-05-09 DIAGNOSIS — E539 Vitamin B deficiency, unspecified: Secondary | ICD-10-CM | POA: Diagnosis not present

## 2015-05-09 DIAGNOSIS — N186 End stage renal disease: Secondary | ICD-10-CM | POA: Diagnosis not present

## 2015-05-09 DIAGNOSIS — D509 Iron deficiency anemia, unspecified: Secondary | ICD-10-CM | POA: Diagnosis not present

## 2015-05-09 DIAGNOSIS — Z992 Dependence on renal dialysis: Secondary | ICD-10-CM | POA: Diagnosis not present

## 2015-05-10 DIAGNOSIS — Z992 Dependence on renal dialysis: Secondary | ICD-10-CM | POA: Diagnosis not present

## 2015-05-10 DIAGNOSIS — N186 End stage renal disease: Secondary | ICD-10-CM | POA: Diagnosis not present

## 2015-05-11 DIAGNOSIS — D509 Iron deficiency anemia, unspecified: Secondary | ICD-10-CM | POA: Diagnosis not present

## 2015-05-11 DIAGNOSIS — Z23 Encounter for immunization: Secondary | ICD-10-CM | POA: Diagnosis not present

## 2015-05-11 DIAGNOSIS — Z992 Dependence on renal dialysis: Secondary | ICD-10-CM | POA: Diagnosis not present

## 2015-05-11 DIAGNOSIS — N186 End stage renal disease: Secondary | ICD-10-CM | POA: Diagnosis not present

## 2015-05-11 DIAGNOSIS — E539 Vitamin B deficiency, unspecified: Secondary | ICD-10-CM | POA: Diagnosis not present

## 2015-05-12 DIAGNOSIS — Z992 Dependence on renal dialysis: Secondary | ICD-10-CM | POA: Diagnosis not present

## 2015-05-12 DIAGNOSIS — N186 End stage renal disease: Secondary | ICD-10-CM | POA: Diagnosis not present

## 2015-05-13 DIAGNOSIS — Z992 Dependence on renal dialysis: Secondary | ICD-10-CM | POA: Diagnosis not present

## 2015-05-13 DIAGNOSIS — N186 End stage renal disease: Secondary | ICD-10-CM | POA: Diagnosis not present

## 2015-05-14 DIAGNOSIS — D509 Iron deficiency anemia, unspecified: Secondary | ICD-10-CM | POA: Diagnosis not present

## 2015-05-14 DIAGNOSIS — E539 Vitamin B deficiency, unspecified: Secondary | ICD-10-CM | POA: Diagnosis not present

## 2015-05-14 DIAGNOSIS — Z992 Dependence on renal dialysis: Secondary | ICD-10-CM | POA: Diagnosis not present

## 2015-05-14 DIAGNOSIS — Z23 Encounter for immunization: Secondary | ICD-10-CM | POA: Diagnosis not present

## 2015-05-14 DIAGNOSIS — N186 End stage renal disease: Secondary | ICD-10-CM | POA: Diagnosis not present

## 2015-05-15 DIAGNOSIS — N186 End stage renal disease: Secondary | ICD-10-CM | POA: Diagnosis not present

## 2015-05-15 DIAGNOSIS — Z992 Dependence on renal dialysis: Secondary | ICD-10-CM | POA: Diagnosis not present

## 2015-05-16 DIAGNOSIS — Z23 Encounter for immunization: Secondary | ICD-10-CM | POA: Diagnosis not present

## 2015-05-16 DIAGNOSIS — Z992 Dependence on renal dialysis: Secondary | ICD-10-CM | POA: Diagnosis not present

## 2015-05-16 DIAGNOSIS — N186 End stage renal disease: Secondary | ICD-10-CM | POA: Diagnosis not present

## 2015-05-16 DIAGNOSIS — D509 Iron deficiency anemia, unspecified: Secondary | ICD-10-CM | POA: Diagnosis not present

## 2015-05-16 DIAGNOSIS — E539 Vitamin B deficiency, unspecified: Secondary | ICD-10-CM | POA: Diagnosis not present

## 2015-05-17 DIAGNOSIS — N186 End stage renal disease: Secondary | ICD-10-CM | POA: Diagnosis not present

## 2015-05-17 DIAGNOSIS — Z992 Dependence on renal dialysis: Secondary | ICD-10-CM | POA: Diagnosis not present

## 2015-05-18 DIAGNOSIS — N186 End stage renal disease: Secondary | ICD-10-CM | POA: Diagnosis not present

## 2015-05-18 DIAGNOSIS — D509 Iron deficiency anemia, unspecified: Secondary | ICD-10-CM | POA: Diagnosis not present

## 2015-05-18 DIAGNOSIS — Z992 Dependence on renal dialysis: Secondary | ICD-10-CM | POA: Diagnosis not present

## 2015-05-18 DIAGNOSIS — E539 Vitamin B deficiency, unspecified: Secondary | ICD-10-CM | POA: Diagnosis not present

## 2015-05-18 DIAGNOSIS — Z23 Encounter for immunization: Secondary | ICD-10-CM | POA: Diagnosis not present

## 2015-05-19 DIAGNOSIS — Z992 Dependence on renal dialysis: Secondary | ICD-10-CM | POA: Diagnosis not present

## 2015-05-19 DIAGNOSIS — N186 End stage renal disease: Secondary | ICD-10-CM | POA: Diagnosis not present

## 2015-05-20 ENCOUNTER — Inpatient Hospital Stay
Admission: EM | Admit: 2015-05-20 | Discharge: 2015-06-01 | DRG: 856 | Disposition: A | Discharge status: Skilled Nursing Facility | Payer: Medicare Other | Attending: Internal Medicine | Admitting: Internal Medicine

## 2015-05-20 ENCOUNTER — Encounter: Payer: Self-pay | Admitting: *Deleted

## 2015-05-20 ENCOUNTER — Emergency Department: Payer: Medicare Other

## 2015-05-20 DIAGNOSIS — Z823 Family history of stroke: Secondary | ICD-10-CM

## 2015-05-20 DIAGNOSIS — E785 Hyperlipidemia, unspecified: Secondary | ICD-10-CM | POA: Diagnosis present

## 2015-05-20 DIAGNOSIS — N2581 Secondary hyperparathyroidism of renal origin: Secondary | ICD-10-CM | POA: Diagnosis not present

## 2015-05-20 DIAGNOSIS — I959 Hypotension, unspecified: Secondary | ICD-10-CM | POA: Diagnosis present

## 2015-05-20 DIAGNOSIS — R0902 Hypoxemia: Secondary | ICD-10-CM | POA: Diagnosis present

## 2015-05-20 DIAGNOSIS — E538 Deficiency of other specified B group vitamins: Secondary | ICD-10-CM | POA: Diagnosis present

## 2015-05-20 DIAGNOSIS — J90 Pleural effusion, not elsewhere classified: Secondary | ICD-10-CM

## 2015-05-20 DIAGNOSIS — A4152 Sepsis due to Pseudomonas: Secondary | ICD-10-CM | POA: Diagnosis present

## 2015-05-20 DIAGNOSIS — Y92129 Unspecified place in nursing home as the place of occurrence of the external cause: Secondary | ICD-10-CM | POA: Diagnosis not present

## 2015-05-20 DIAGNOSIS — E039 Hypothyroidism, unspecified: Secondary | ICD-10-CM | POA: Diagnosis not present

## 2015-05-20 DIAGNOSIS — I5033 Acute on chronic diastolic (congestive) heart failure: Secondary | ICD-10-CM | POA: Diagnosis present

## 2015-05-20 DIAGNOSIS — J80 Acute respiratory distress syndrome: Secondary | ICD-10-CM | POA: Diagnosis not present

## 2015-05-20 DIAGNOSIS — L7682 Other postprocedural complications of skin and subcutaneous tissue: Secondary | ICD-10-CM | POA: Diagnosis not present

## 2015-05-20 DIAGNOSIS — L7634 Postprocedural seroma of skin and subcutaneous tissue following other procedure: Secondary | ICD-10-CM | POA: Diagnosis present

## 2015-05-20 DIAGNOSIS — Y841 Kidney dialysis as the cause of abnormal reaction of the patient, or of later complication, without mention of misadventure at the time of the procedure: Secondary | ICD-10-CM | POA: Diagnosis not present

## 2015-05-20 DIAGNOSIS — Y832 Surgical operation with anastomosis, bypass or graft as the cause of abnormal reaction of the patient, or of later complication, without mention of misadventure at the time of the procedure: Secondary | ICD-10-CM | POA: Diagnosis present

## 2015-05-20 DIAGNOSIS — J449 Chronic obstructive pulmonary disease, unspecified: Secondary | ICD-10-CM | POA: Diagnosis present

## 2015-05-20 DIAGNOSIS — K219 Gastro-esophageal reflux disease without esophagitis: Secondary | ICD-10-CM | POA: Diagnosis present

## 2015-05-20 DIAGNOSIS — R739 Hyperglycemia, unspecified: Secondary | ICD-10-CM | POA: Diagnosis present

## 2015-05-20 DIAGNOSIS — Z1621 Resistance to vancomycin: Secondary | ICD-10-CM | POA: Diagnosis present

## 2015-05-20 DIAGNOSIS — R609 Edema, unspecified: Secondary | ICD-10-CM | POA: Diagnosis not present

## 2015-05-20 DIAGNOSIS — N185 Chronic kidney disease, stage 5: Secondary | ICD-10-CM | POA: Diagnosis not present

## 2015-05-20 DIAGNOSIS — R262 Difficulty in walking, not elsewhere classified: Secondary | ICD-10-CM | POA: Diagnosis not present

## 2015-05-20 DIAGNOSIS — D7582 Heparin induced thrombocytopenia (HIT): Secondary | ICD-10-CM | POA: Diagnosis present

## 2015-05-20 DIAGNOSIS — I132 Hypertensive heart and chronic kidney disease with heart failure and with stage 5 chronic kidney disease, or end stage renal disease: Secondary | ICD-10-CM | POA: Diagnosis present

## 2015-05-20 DIAGNOSIS — I714 Abdominal aortic aneurysm, without rupture: Secondary | ICD-10-CM | POA: Diagnosis not present

## 2015-05-20 DIAGNOSIS — I890 Lymphedema, not elsewhere classified: Secondary | ICD-10-CM | POA: Diagnosis not present

## 2015-05-20 DIAGNOSIS — H6593 Unspecified nonsuppurative otitis media, bilateral: Secondary | ICD-10-CM | POA: Diagnosis not present

## 2015-05-20 DIAGNOSIS — E7800 Pure hypercholesterolemia, unspecified: Secondary | ICD-10-CM | POA: Diagnosis not present

## 2015-05-20 DIAGNOSIS — Z8049 Family history of malignant neoplasm of other genital organs: Secondary | ICD-10-CM | POA: Diagnosis not present

## 2015-05-20 DIAGNOSIS — J441 Chronic obstructive pulmonary disease with (acute) exacerbation: Secondary | ICD-10-CM | POA: Diagnosis not present

## 2015-05-20 DIAGNOSIS — L8990 Pressure ulcer of unspecified site, unspecified stage: Secondary | ICD-10-CM | POA: Insufficient documentation

## 2015-05-20 DIAGNOSIS — Z992 Dependence on renal dialysis: Secondary | ICD-10-CM

## 2015-05-20 DIAGNOSIS — I713 Abdominal aortic aneurysm, ruptured: Secondary | ICD-10-CM | POA: Diagnosis not present

## 2015-05-20 DIAGNOSIS — Z8249 Family history of ischemic heart disease and other diseases of the circulatory system: Secondary | ICD-10-CM | POA: Diagnosis not present

## 2015-05-20 DIAGNOSIS — N186 End stage renal disease: Secondary | ICD-10-CM | POA: Diagnosis present

## 2015-05-20 DIAGNOSIS — Y848 Other medical procedures as the cause of abnormal reaction of the patient, or of later complication, without mention of misadventure at the time of the procedure: Secondary | ICD-10-CM | POA: Diagnosis present

## 2015-05-20 DIAGNOSIS — T888XXA Other specified complications of surgical and medical care, not elsewhere classified, initial encounter: Secondary | ICD-10-CM | POA: Diagnosis not present

## 2015-05-20 DIAGNOSIS — I2510 Atherosclerotic heart disease of native coronary artery without angina pectoris: Secondary | ICD-10-CM | POA: Diagnosis not present

## 2015-05-20 DIAGNOSIS — M6281 Muscle weakness (generalized): Secondary | ICD-10-CM | POA: Diagnosis not present

## 2015-05-20 DIAGNOSIS — I214 Non-ST elevation (NSTEMI) myocardial infarction: Secondary | ICD-10-CM | POA: Diagnosis not present

## 2015-05-20 DIAGNOSIS — A419 Sepsis, unspecified organism: Secondary | ICD-10-CM

## 2015-05-20 DIAGNOSIS — R6520 Severe sepsis without septic shock: Secondary | ICD-10-CM

## 2015-05-20 DIAGNOSIS — J189 Pneumonia, unspecified organism: Secondary | ICD-10-CM | POA: Diagnosis not present

## 2015-05-20 DIAGNOSIS — Z87891 Personal history of nicotine dependence: Secondary | ICD-10-CM | POA: Diagnosis not present

## 2015-05-20 DIAGNOSIS — Y95 Nosocomial condition: Secondary | ICD-10-CM | POA: Diagnosis present

## 2015-05-20 DIAGNOSIS — T814XXA Infection following a procedure, initial encounter: Principal | ICD-10-CM | POA: Diagnosis present

## 2015-05-20 DIAGNOSIS — T827XXA Infection and inflammatory reaction due to other cardiac and vascular devices, implants and grafts, initial encounter: Secondary | ICD-10-CM | POA: Diagnosis not present

## 2015-05-20 DIAGNOSIS — J310 Chronic rhinitis: Secondary | ICD-10-CM | POA: Diagnosis not present

## 2015-05-20 DIAGNOSIS — I739 Peripheral vascular disease, unspecified: Secondary | ICD-10-CM | POA: Diagnosis present

## 2015-05-20 DIAGNOSIS — I5031 Acute diastolic (congestive) heart failure: Secondary | ICD-10-CM

## 2015-05-20 DIAGNOSIS — J810 Acute pulmonary edema: Secondary | ICD-10-CM | POA: Diagnosis not present

## 2015-05-20 DIAGNOSIS — L02214 Cutaneous abscess of groin: Secondary | ICD-10-CM

## 2015-05-20 DIAGNOSIS — Z9889 Other specified postprocedural states: Secondary | ICD-10-CM

## 2015-05-20 DIAGNOSIS — Z7982 Long term (current) use of aspirin: Secondary | ICD-10-CM

## 2015-05-20 DIAGNOSIS — F419 Anxiety disorder, unspecified: Secondary | ICD-10-CM | POA: Diagnosis not present

## 2015-05-20 DIAGNOSIS — T148 Other injury of unspecified body region: Secondary | ICD-10-CM | POA: Diagnosis not present

## 2015-05-20 DIAGNOSIS — R0600 Dyspnea, unspecified: Secondary | ICD-10-CM | POA: Diagnosis not present

## 2015-05-20 DIAGNOSIS — D72829 Elevated white blood cell count, unspecified: Secondary | ICD-10-CM | POA: Diagnosis not present

## 2015-05-20 DIAGNOSIS — S301XXA Contusion of abdominal wall, initial encounter: Secondary | ICD-10-CM | POA: Diagnosis not present

## 2015-05-20 DIAGNOSIS — I509 Heart failure, unspecified: Secondary | ICD-10-CM | POA: Diagnosis not present

## 2015-05-20 DIAGNOSIS — R062 Wheezing: Secondary | ICD-10-CM | POA: Diagnosis not present

## 2015-05-20 DIAGNOSIS — E876 Hypokalemia: Secondary | ICD-10-CM | POA: Diagnosis present

## 2015-05-20 DIAGNOSIS — I10 Essential (primary) hypertension: Secondary | ICD-10-CM | POA: Diagnosis not present

## 2015-05-20 DIAGNOSIS — K5900 Constipation, unspecified: Secondary | ICD-10-CM | POA: Diagnosis not present

## 2015-05-20 DIAGNOSIS — J690 Pneumonitis due to inhalation of food and vomit: Secondary | ICD-10-CM | POA: Diagnosis not present

## 2015-05-20 DIAGNOSIS — I5030 Unspecified diastolic (congestive) heart failure: Secondary | ICD-10-CM | POA: Diagnosis not present

## 2015-05-20 DIAGNOSIS — R0602 Shortness of breath: Secondary | ICD-10-CM | POA: Diagnosis not present

## 2015-05-20 DIAGNOSIS — D649 Anemia, unspecified: Secondary | ICD-10-CM | POA: Diagnosis not present

## 2015-05-20 DIAGNOSIS — D631 Anemia in chronic kidney disease: Secondary | ICD-10-CM | POA: Diagnosis present

## 2015-05-20 DIAGNOSIS — R509 Fever, unspecified: Secondary | ICD-10-CM | POA: Diagnosis not present

## 2015-05-20 DIAGNOSIS — J181 Lobar pneumonia, unspecified organism: Secondary | ICD-10-CM | POA: Diagnosis not present

## 2015-05-20 LAB — CBC
HEMATOCRIT: 31.7 % — AB (ref 35.0–47.0)
Hemoglobin: 10.5 g/dL — ABNORMAL LOW (ref 12.0–16.0)
MCH: 32.9 pg (ref 26.0–34.0)
MCHC: 33.2 g/dL (ref 32.0–36.0)
MCV: 98.8 fL (ref 80.0–100.0)
PLATELETS: 483 10*3/uL — AB (ref 150–440)
RBC: 3.21 MIL/uL — ABNORMAL LOW (ref 3.80–5.20)
RDW: 15 % — AB (ref 11.5–14.5)
WBC: 15.1 10*3/uL — AB (ref 3.6–11.0)

## 2015-05-20 LAB — BLOOD GAS, ARTERIAL
ACID-BASE EXCESS: 3.3 mmol/L — AB (ref 0.0–3.0)
ALLENS TEST (PASS/FAIL): POSITIVE — AB
Bicarbonate: 27.8 mEq/L (ref 21.0–28.0)
Delivery systems: POSITIVE
EXPIRATORY PAP: 5
FIO2: 0.4
INSPIRATORY PAP: 12
O2 SAT: 94.9 %
PH ART: 7.44 (ref 7.350–7.450)
PO2 ART: 72 mmHg — AB (ref 83.0–108.0)
Patient temperature: 37
RATE: 12 resp/min
pCO2 arterial: 41 mmHg (ref 32.0–48.0)

## 2015-05-20 LAB — COMPREHENSIVE METABOLIC PANEL
ALT: 13 U/L — ABNORMAL LOW (ref 14–54)
ANION GAP: 13 (ref 5–15)
AST: 36 U/L (ref 15–41)
Albumin: 2.7 g/dL — ABNORMAL LOW (ref 3.5–5.0)
Alkaline Phosphatase: 124 U/L (ref 38–126)
BILIRUBIN TOTAL: 0.9 mg/dL (ref 0.3–1.2)
BUN: 29 mg/dL — AB (ref 6–20)
CO2: 27 mmol/L (ref 22–32)
Calcium: 8.6 mg/dL — ABNORMAL LOW (ref 8.9–10.3)
Chloride: 97 mmol/L — ABNORMAL LOW (ref 101–111)
Creatinine, Ser: 3.45 mg/dL — ABNORMAL HIGH (ref 0.44–1.00)
GFR, EST AFRICAN AMERICAN: 15 mL/min — AB (ref >60)
GFR, EST NON AFRICAN AMERICAN: 13 mL/min — AB (ref >60)
Glucose, Bld: 152 mg/dL — ABNORMAL HIGH (ref 65–99)
POTASSIUM: 3 mmol/L — AB (ref 3.5–5.1)
Sodium: 137 mmol/L (ref 135–145)
TOTAL PROTEIN: 7.2 g/dL (ref 6.5–8.1)

## 2015-05-20 LAB — GLUCOSE, CAPILLARY: GLUCOSE-CAPILLARY: 89 mg/dL (ref 65–99)

## 2015-05-20 LAB — BRAIN NATRIURETIC PEPTIDE: B NATRIURETIC PEPTIDE 5: 3449 pg/mL — AB (ref 0.0–100.0)

## 2015-05-20 LAB — MRSA PCR SCREENING: MRSA BY PCR: NEGATIVE

## 2015-05-20 LAB — TROPONIN I: Troponin I: 0.59 ng/mL — ABNORMAL HIGH (ref <0.031)

## 2015-05-20 MED ORDER — HYDRALAZINE HCL 10 MG PO TABS
10.0000 mg | ORAL_TABLET | Freq: Four times a day (QID) | ORAL | Status: DC
  Filled 2015-05-20 (×4): qty 1

## 2015-05-20 MED ORDER — NITROGLYCERIN 2 % TD OINT
TOPICAL_OINTMENT | TRANSDERMAL | Status: AC
  Filled 2015-05-20: qty 1

## 2015-05-20 MED ORDER — RENA-VITE PO TABS
1.0000 | ORAL_TABLET | Freq: Every day | ORAL | Status: DC
  Administered 2015-05-20 – 2015-05-31 (×11): 1 via ORAL
  Filled 2015-05-20 (×11): qty 1

## 2015-05-20 MED ORDER — ACETAMINOPHEN 500 MG PO TABS
1000.0000 mg | ORAL_TABLET | Freq: Three times a day (TID) | ORAL | Status: DC
  Administered 2015-05-20 – 2015-06-01 (×24): 1000 mg via ORAL
  Filled 2015-05-20 (×28): qty 2

## 2015-05-20 MED ORDER — MORPHINE SULFATE (PF) 2 MG/ML IV SOLN
0.5000 mg | INTRAVENOUS | Status: DC | PRN
  Administered 2015-05-27: 0.5 mg via INTRAVENOUS
  Filled 2015-05-20 (×2): qty 1

## 2015-05-20 MED ORDER — METOPROLOL TARTRATE 50 MG PO TABS: 50.0000 mg | ORAL_TABLET | Freq: Three times a day (TID) | ORAL | Status: DC

## 2015-05-20 MED ORDER — ALBUTEROL SULFATE (2.5 MG/3ML) 0.083% IN NEBU
5.0000 mg | INHALATION_SOLUTION | Freq: Once | RESPIRATORY_TRACT | Status: AC
  Administered 2015-05-20: 08:00:00 via RESPIRATORY_TRACT

## 2015-05-20 MED ORDER — ALBUTEROL SULFATE (2.5 MG/3ML) 0.083% IN NEBU
5.0000 mg | INHALATION_SOLUTION | Freq: Once | RESPIRATORY_TRACT | Status: AC
  Administered 2015-05-20: 5 mg via RESPIRATORY_TRACT

## 2015-05-20 MED ORDER — MAGNESIUM HYDROXIDE 400 MG/5ML PO SUSP: 30.0000 mL | Freq: Every day | ORAL | Status: DC | PRN

## 2015-05-20 MED ORDER — MOMETASONE FURO-FORMOTEROL FUM 100-5 MCG/ACT IN AERO
2.0000 | INHALATION_SPRAY | Freq: Two times a day (BID) | RESPIRATORY_TRACT | Status: DC
  Administered 2015-05-20 – 2015-06-01 (×23): 2 via RESPIRATORY_TRACT
  Filled 2015-05-20: qty 8.8

## 2015-05-20 MED ORDER — BISACODYL 5 MG PO TBEC: 5.0000 mg | DELAYED_RELEASE_TABLET | Freq: Every day | ORAL | Status: DC | PRN

## 2015-05-20 MED ORDER — HYDROCOD POLST-CPM POLST ER 10-8 MG/5ML PO SUER: 5.0000 mL | Freq: Two times a day (BID) | ORAL | Status: DC | PRN

## 2015-05-20 MED ORDER — FLUTICASONE FUROATE-VILANTEROL 100-25 MCG/INH IN AEPB: 1.0000 | INHALATION_SPRAY | Freq: Every day | RESPIRATORY_TRACT | Status: DC

## 2015-05-20 MED ORDER — VANCOMYCIN HCL IN DEXTROSE 1-5 GM/200ML-% IV SOLN
1000.0000 mg | Freq: Once | INTRAVENOUS | Status: AC
  Administered 2015-05-20: 1000 mg via INTRAVENOUS
  Filled 2015-05-20: qty 200

## 2015-05-20 MED ORDER — SODIUM CHLORIDE 0.9 % IV SOLN
INTRAVENOUS | Status: DC
  Administered 2015-05-20: 12:00:00 via INTRAVENOUS

## 2015-05-20 MED ORDER — DOCUSATE SODIUM 100 MG PO CAPS
100.0000 mg | ORAL_CAPSULE | Freq: Two times a day (BID) | ORAL | Status: DC
  Administered 2015-05-20 – 2015-05-29 (×5): 100 mg via ORAL
  Filled 2015-05-20 (×17): qty 1

## 2015-05-20 MED ORDER — FUROSEMIDE 10 MG/ML IJ SOLN
40.0000 mg | Freq: Once | INTRAMUSCULAR | Status: AC
  Administered 2015-05-20: 40 mg via INTRAVENOUS

## 2015-05-20 MED ORDER — ACETAMINOPHEN 325 MG PO TABS
650.0000 mg | ORAL_TABLET | Freq: Four times a day (QID) | ORAL | Status: DC | PRN
  Administered 2015-05-27 – 2015-05-28 (×2): 650 mg via ORAL
  Filled 2015-05-20 (×2): qty 2

## 2015-05-20 MED ORDER — PIPERACILLIN-TAZOBACTAM 3.375 G IVPB
3.3750 g | Freq: Two times a day (BID) | INTRAVENOUS | Status: DC
  Administered 2015-05-20 – 2015-05-24 (×8): 3.375 g via INTRAVENOUS
  Filled 2015-05-20 (×11): qty 50

## 2015-05-20 MED ORDER — ONDANSETRON HCL 4 MG PO TABS: 4.0000 mg | ORAL_TABLET | Freq: Four times a day (QID) | ORAL | Status: DC | PRN

## 2015-05-20 MED ORDER — HYDROCODONE-ACETAMINOPHEN 5-325 MG PO TABS
1.0000 | ORAL_TABLET | ORAL | Status: DC | PRN
  Administered 2015-05-21: 2 via ORAL
  Administered 2015-05-21: 13:00:00 1 via ORAL
  Administered 2015-05-22: 23:00:00 2 via ORAL
  Administered 2015-05-25 – 2015-05-26 (×3): 1 via ORAL
  Administered 2015-05-28: 2 via ORAL
  Administered 2015-05-30 – 2015-06-01 (×3): 1 via ORAL
  Filled 2015-05-20 (×2): qty 2
  Filled 2015-05-20 (×2): qty 1
  Filled 2015-05-20: qty 2
  Filled 2015-05-20 (×5): qty 1

## 2015-05-20 MED ORDER — LOPERAMIDE HCL 2 MG PO CAPS: 2.0000 mg | ORAL_CAPSULE | ORAL | Status: DC | PRN

## 2015-05-20 MED ORDER — LEVOTHYROXINE SODIUM 88 MCG PO TABS
88.0000 ug | ORAL_TABLET | Freq: Every day | ORAL | Status: DC
  Administered 2015-05-21 – 2015-06-01 (×9): 88 ug via ORAL
  Filled 2015-05-20 (×10): qty 1

## 2015-05-20 MED ORDER — FAMOTIDINE 20 MG PO TABS
20.0000 mg | ORAL_TABLET | Freq: Every day | ORAL | Status: DC
  Administered 2015-05-20 – 2015-05-21 (×2): 20 mg via ORAL
  Filled 2015-05-20 (×2): qty 1

## 2015-05-20 MED ORDER — PROMETHAZINE HCL 25 MG/ML IJ SOLN: 12.5000 mg | Freq: Four times a day (QID) | INTRAMUSCULAR | Status: DC | PRN

## 2015-05-20 MED ORDER — VANCOMYCIN HCL 10 G IV SOLR
1250.0000 mg | Freq: Once | INTRAVENOUS | Status: DC
  Filled 2015-05-20: qty 1250

## 2015-05-20 MED ORDER — GUAIFENESIN-DM 100-10 MG/5ML PO SYRP: 10.0000 mL | ORAL_SOLUTION | ORAL | Status: DC | PRN

## 2015-05-20 MED ORDER — ONDANSETRON HCL 4 MG/2ML IJ SOLN
4.0000 mg | Freq: Four times a day (QID) | INTRAMUSCULAR | Status: DC | PRN
  Administered 2015-05-24: 4 mg via INTRAVENOUS
  Filled 2015-05-20: qty 2

## 2015-05-20 MED ORDER — METOPROLOL TARTRATE 25 MG PO TABS
25.0000 mg | ORAL_TABLET | Freq: Three times a day (TID) | ORAL | Status: DC
  Administered 2015-05-20 – 2015-05-22 (×5): 25 mg via ORAL
  Filled 2015-05-20 (×6): qty 1

## 2015-05-20 MED ORDER — SALINE SPRAY 0.65 % NA SOLN
1.0000 | Freq: Every day | NASAL | Status: DC
  Administered 2015-05-20 – 2015-06-01 (×50): 1 via NASAL
  Filled 2015-05-20: qty 44

## 2015-05-20 MED ORDER — ACETAMINOPHEN 650 MG RE SUPP: 650.0000 mg | Freq: Four times a day (QID) | RECTAL | Status: DC | PRN

## 2015-05-20 MED ORDER — ATORVASTATIN CALCIUM 20 MG PO TABS
40.0000 mg | ORAL_TABLET | Freq: Every day | ORAL | Status: DC
  Administered 2015-05-20 – 2015-05-29 (×9): 40 mg via ORAL
  Filled 2015-05-20 (×9): qty 2

## 2015-05-20 MED ORDER — ASPIRIN EC 81 MG PO TBEC
81.0000 mg | DELAYED_RELEASE_TABLET | Freq: Every day | ORAL | Status: DC
  Administered 2015-05-20 – 2015-05-31 (×11): 81 mg via ORAL
  Filled 2015-05-20 (×11): qty 1

## 2015-05-20 MED ORDER — PIPERACILLIN-TAZOBACTAM 3.375 G IVPB
3.3750 g | Freq: Once | INTRAVENOUS | Status: AC
  Administered 2015-05-20: 3.375 g via INTRAVENOUS
  Filled 2015-05-20: qty 50

## 2015-05-20 MED ORDER — IRBESARTAN 75 MG PO TABS: 75.0000 mg | ORAL_TABLET | Freq: Two times a day (BID) | ORAL | Status: DC

## 2015-05-20 MED ORDER — CETYLPYRIDINIUM CHLORIDE 0.05 % MT LIQD
7.0000 mL | Freq: Two times a day (BID) | OROMUCOSAL | Status: DC
  Administered 2015-05-21 – 2015-05-31 (×11): 7 mL via OROMUCOSAL
  Filled 2015-05-20 (×2): qty 7

## 2015-05-20 MED ORDER — AMLODIPINE BESYLATE 5 MG PO TABS: 5.0000 mg | ORAL_TABLET | Freq: Every day | ORAL | Status: DC

## 2015-05-20 MED ORDER — ACETAMINOPHEN 500 MG PO TABS
ORAL_TABLET | ORAL | Status: AC
  Administered 2015-05-20: 1000 mg via ORAL
  Filled 2015-05-20: qty 2

## 2015-05-20 MED ORDER — VANCOMYCIN HCL 500 MG IV SOLR
500.0000 mg | Freq: Once | INTRAVENOUS | Status: AC
  Administered 2015-05-20: 500 mg via INTRAVENOUS
  Filled 2015-05-20: qty 500

## 2015-05-20 MED ORDER — FLUTICASONE PROPIONATE 50 MCG/ACT NA SUSP
2.0000 | Freq: Every day | NASAL | Status: DC
  Administered 2015-05-20 – 2015-05-31 (×10): 2 via NASAL
  Filled 2015-05-20: qty 16

## 2015-05-20 MED ORDER — VANCOMYCIN HCL 500 MG IV SOLR
500.0000 mg | INTRAVENOUS | Status: DC
  Administered 2015-05-21 – 2015-05-23 (×2): 500 mg via INTRAVENOUS
  Filled 2015-05-20 (×3): qty 500

## 2015-05-20 MED ORDER — ACETAMINOPHEN 500 MG PO TABS
1000.0000 mg | ORAL_TABLET | Freq: Once | ORAL | Status: AC
  Administered 2015-05-20: 1000 mg via ORAL

## 2015-05-20 MED ORDER — SENNOSIDES-DOCUSATE SODIUM 8.6-50 MG PO TABS
1.0000 | ORAL_TABLET | Freq: Every evening | ORAL | Status: DC | PRN
  Filled 2015-05-20: qty 1

## 2015-05-20 MED ORDER — ALBUTEROL SULFATE (2.5 MG/3ML) 0.083% IN NEBU
2.5000 mg | INHALATION_SOLUTION | Freq: Four times a day (QID) | RESPIRATORY_TRACT | Status: DC | PRN
  Administered 2015-05-21 – 2015-05-29 (×4): 2.5 mg via RESPIRATORY_TRACT
  Filled 2015-05-20 (×4): qty 3

## 2015-05-20 MED ORDER — FUROSEMIDE 10 MG/ML IJ SOLN
INTRAMUSCULAR | Status: AC
  Administered 2015-05-20: 40 mg via INTRAVENOUS
  Filled 2015-05-20: qty 4

## 2015-05-20 MED ORDER — ALBUTEROL SULFATE (2.5 MG/3ML) 0.083% IN NEBU
INHALATION_SOLUTION | RESPIRATORY_TRACT | Status: AC
  Filled 2015-05-20: qty 6

## 2015-05-20 NOTE — ED Notes (Signed)
Report called to Pam RN.

## 2015-05-20 NOTE — Consult Note (Signed)
PULMONARY / CRITICAL CARE MEDICINE   Name: [name] MRN: [id number] DOB: 10-Nov-1950    ADMISSION DATE:  05/20/2015   CONSULTATION DATE:  05/20/15  REFERRING MD:  Dr Posey Pronto  PT PROFILE: 46 F with ESRD and peripheral vasc dz underwent aorto-bifem bypass recently @ [facility]. She developed a R groin seroma and underwent drainage with muscle flap and wound vac placement @ [facility] in Jan 2017 by Dr Delana Meyer. Admitted via ED 02/20 with severe sepsis, R groin wound infection, possible RLL PNA  HISTORY OF PRESENT ILLNESS:   [name] is a 65 y.o. female with a PMH of chronic diastolic congestive heart failure, ESRD on hemodialysis, COPD, and hypertension who presented to the ED with a  fever of 102.3 and  shortness of breath. She was hypoxic and briefly placed on BiPAP. She recently underwent I&D, muscle flap and a wound VAC placement in January 2017 for a groin seroma that was a complication of a recent aortobifem bypass performed prior to that @ [facility]. Her R groin wound currently is draining cloudy/purulent fluid and the surrounding skin is erythematous and tender. Also, her CXR She was admitted for sepsis secondary to pneumonia.  PAST MEDICAL HISTORY :  She  has a past medical history of Hyperlipidemia; Hypertension; Thyroid disease; GERD (gastroesophageal reflux disease); Aneurysm ([place]); STD (sexually transmitted disease); VAIN II (vaginal intraepithelial neoplasia grade II) (7/09); Granuloma annulare (2010); B12 deficiency; COPD (chronic obstructive pulmonary disease) ([place]); Renal insufficiency; and Cancer ([place]).  PAST SURGICAL HISTORY: She  has past surgical history that includes Tonsillectomy; Hypothenar fat pad transfer (Right, 1986); Total abdominal hysterectomy (1990); Bladder surgery (1998); Nasal septum surgery (1969); precancerous lesions removed from forehead; Colon resection (2/16); Colon surgery; Femoral-femoral Bypass Graft (Bilateral, 04/17/2015); and Central venous catheter  insertion (Left, 04/17/2015).  Allergies  Allergen Reactions  . Asa [Aspirin]   . 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 Plavix [Clopidogrel]     No current facility-administered medications on file prior to encounter.   Current Outpatient Prescriptions on File Prior to Encounter  Medication Sig  . acetaminophen (TYLENOL) 500 MG tablet Take 1,000 mg by mouth every 8 (eight) hours.  Marland Kitchen albuterol (PROVENTIL) (2.5 MG/3ML) 0.083% nebulizer solution Take 2.5 mg by nebulization every 6 (six) hours as needed for wheezing.  Marland Kitchen amLODipine (NORVASC) 5 MG tablet Take 1 tablet (5 mg total) by mouth daily.  Marland Kitchen aspirin EC 81 MG tablet Take 81 mg by mouth daily.  Marland Kitchen atorvastatin (LIPITOR) 40 MG tablet Take 40 mg by mouth daily.  . chlorpheniramine-HYDROcodone (TUSSIONEX) 10-8 MG/5ML SUER Take 5 mLs by mouth every 12 (twelve) hours as needed for cough.  . docusate sodium (COLACE) 100 MG capsule Take 100 mg by mouth 2 (two) times daily.   . famotidine (PEPCID) 20 MG tablet Take 20 mg by mouth daily.  . fluticasone (FLONASE) 50 MCG/ACT nasal spray Place 2 sprays into both nostrils daily.  Marland Kitchen guaiFENesin-dextromethorphan (ROBITUSSIN DM) 100-10 MG/5ML syrup Take 10 mLs by mouth every 4 (four) hours as needed for cough.  . hydrALAZINE (APRESOLINE) 10 MG tablet Take 10 mg by mouth every 6 (six) hours.  . irbesartan (AVAPRO) 75 MG tablet Take 1 tablet (75 mg total) by mouth 2 (two) times daily.  Marland Kitchen levothyroxine (SYNTHROID, LEVOTHROID) 88 MCG tablet Take 88 mcg by mouth daily.  . magnesium hydroxide (MILK OF MAGNESIA) 400 MG/5ML suspension Take 30 mLs by mouth daily as needed for mild constipation.  Marland Kitchen  metoprolol (LOPRESSOR) 50 MG tablet Take 1 tablet (50 mg total) by mouth every 8 (eight) hours.  . multivitamin (RENA-VIT) TABS tablet Take 1 tablet by mouth daily.  . ondansetron (ZOFRAN) 4 MG tablet Take 4 mg by mouth every 6 (six) hours as needed for nausea or vomiting.  . promethazine  (PHENERGAN) 25 MG/ML injection Inject 25 mg into the vein every 6 (six) hours as needed for nausea or vomiting.  . sodium chloride (OCEAN) 0.65 % SOLN nasal spray Place 1 spray into both nostrils 5 (five) times daily.    FAMILY HISTORY:  Her indicated that her mother is deceased. She indicated that her father is deceased. She indicated that her sister is deceased. She indicated that her other is alive.   SOCIAL HISTORY: She  reports that she quit smoking about 3 months ago. She has never used smokeless tobacco. She reports that she does not drink alcohol or use illicit drugs.  REVIEW OF SYSTEMS:   Denies head and neck symptoms, chest pain, palpitations, abd pain, N/V/D, dysuria, LE edema and calf tenderness  SUBJECTIVE:    VITAL SIGNS: BP 114/64 mmHg  Pulse 86  Temp(Src) 98.4 F (36.9 C) (Oral)  Resp 16  Ht 5\' 4"  (1.626 m)  Wt 130 lb 1.1 oz (59 kg)  BMI 22.32 kg/m2  SpO2 97%  LMP 03/30/1988  HEMODYNAMICS:    VENTILATOR SETTINGS: Vent Mode:  [-]  FiO2 (%):  [40 %] 40 %  INTAKE / OUTPUT:    PHYSICAL EXAMINATION: General:  Chronically ill appearing in NAD off BiPAP Neuro: CNs, motor, sensory, DTRs intact HEENT: NCAT Cardiovascular: Reg, no M Lungs: barrel chested, clear anteriorly without adventitious sounds Abdomen: soft, NT, + BS Ext: warm, no edema R inguinal wound with scant purulent drainage and surrounding erythema  LABS:  BMET  Recent Labs Lab 05/20/15 0758  NA 137  K 3.0*  CL 97*  CO2 27  BUN 29*  CREATININE 3.45*  GLUCOSE 152*    Electrolytes  Recent Labs Lab 05/20/15 0758  CALCIUM 8.6*    CBC  Recent Labs Lab 05/20/15 0758  WBC 15.1*  HGB 10.5*  HCT 31.7*  PLT 483*    Coag's No results for input(s): APTT, INR in the last 168 hours.  Sepsis Markers No results for input(s): LATICACIDVEN, PROCALCITON, O2SATVEN in the last 168 hours.  ABG  Recent Labs Lab 05/20/15 0810  PHART 7.44  PCO2ART 41  PO2ART 72*    Liver  Enzymes  Recent Labs Lab 05/20/15 0758  AST 36  ALT 13*  ALKPHOS 124  BILITOT 0.9  ALBUMIN 2.7*    Cardiac Enzymes  Recent Labs Lab 05/20/15 0758  TROPONINI 0.59*    Glucose  Recent Labs Lab 05/20/15 1101  GLUCAP 89    Imaging Dg Chest Port 1 View  05/20/2015  CLINICAL DATA:  65 year old female with respiratory distress and lower extremity swelling. EXAM: PORTABLE CHEST 1 VIEW COMPARISON:  Radiograph dated 04/17/2015 FINDINGS: A dual lumen dialysis catheter is noted in stable positioning. There has been interval removal of the left IJ central line. Single portable view of the chest demonstrates emphysematous changes lungs. There are bilateral hazy densities, right greater left. There has been interval improvement of the interstitial prominence, likely interval improvement in pulmonary edema. Small left pleural effusion may be present. The cardiac silhouette is within normal limits. No acute osseous pathology identified. IMPRESSION: Interval removal of the left IJ central venous line. Interval improvement of the previously mild pulmonary  edema. Bibasilar hazy airspace densities, right appearing left. Electronically Signed   By: Anner Crete M.D.   On: 05/20/2015 08:30     STUDIES:    CULTURES: Wound 2/20 >>   ANTIBIOTICS: Vanc 02/20 >>  Pip-tazo 02/20 >>   SIGNIFICANT EVENTS:   LINES/TUBES:   ASSESSMENT / PLAN:  PULMONARY A: Acute respiratory distress, improving P:   Supplemental O2 as needed to maintain SpO2 > 92% Cont PRN BiPAP  CARDIOVASCULAR A:  Hypotension - suspect sepsis related P:  Decrease metoprolol dose (not stopping completely to prevent rebound tachycardia) Hold other antihypertensive meds  MAP goal > 60 mmHg   RENAL A:   ESRD P:   Monitor BMET intermittently Monitor I/Os Correct electrolytes as indicated Renal Service following  GASTROINTESTINAL A:   No issues P:   SUP: enteral famotidine Cont diet PO  HEMATOLOGIC A:    Very mild anemia - likely due to CKD P:  DVT px: SQ heparin Monitor CBC intermittently Transfuse per usual guidelines  INFECTIOUS A:   Severe sepsis - likely source is R groin infection P:   Monitor temp, WBC count Micro and abx as above Unfortunately, it appears that BCs were not drawn on admission Vasc Surg eval to consider whether repeat I&D is needed  Spoke with Dr Lucky Cowboy  ENDOCRINE A:   Mild stress induced hyperglycemia without prior dx of DM P:   Monitor glu on chem panels Consider SSI for glu > 180  NEUROLOGIC A:   No issues P:   RASS goal: 0 monitor    Merton Border, MD PCCM service Mobile [phone] Pager [phone]  05/20/2015, 4:33 PM

## 2015-05-20 NOTE — Progress Notes (Addendum)
Pharmacy Antibiotic Note  [name] is a 65 y.o. female admitted on 05/20/2015 with sepsis.  Pharmacy has been consulted for Vancomycin and Zosyn  dosing.  Plan: Patient receives hemodialysis; however not clear on patient's schedule. Will give an additional 500 mg of Vancomycin to = 1500 mg load. Will then start Vancomycin 500 mg IV qHD; however will need to follow up with nephrology to find out actual schedule.   Trough level ordered for Saturday February 26. 3rd dialysis session likely will be on the 26th     Zosyn: Will start Zosyn 3.375 g IV q12 hours.    Height: 5\' 4"  (162.6 cm) Weight: 130 lb 1.1 oz (59 kg) IBW/kg (Calculated) : 54.7  Temp (24hrs), Avg:100 F (37.8 C), Min:97.9 F (36.6 C), Max:102 F (38.9 C)   Recent Labs Lab 05/20/15 0758  WBC 15.1*  CREATININE 3.45*    Estimated Creatinine Clearance: 13.9 mL/min (by C-G formula based on Cr of 3.45).    Allergies  Allergen Reactions  . Asa [Aspirin]   . 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 Plavix [Clopidogrel]     Antimicrobials this admission: Zosyn  2/20 >>  Vancomycin 2/20 >>   Dose adjustments this admission:   Microbiology results: BCx: UCx:   Sputum:   MRSA PCR:   Thank you for allowing pharmacy to be a part of this patient's care.  Jamea Robicheaux D 05/20/2015 11:43 AM

## 2015-05-20 NOTE — Care Management (Signed)
Patient  with recent discharge from [facility] to [facility] after stay for pulmonary edema and complication of dialysis device requiring surgical intervention.  Receives dialysis at [facility] on [address] on [address]. Presented to ED with shortness of breath and temp.  Admitted to icu due to need for continuous bipap.  Consult present for clinical social work

## 2015-05-20 NOTE — Plan of Care (Signed)
Problem: Fluid Volume: Goal: Hemodynamic stability will improve Outcome: Progressing No pressors . VSS  Problem: Physical Regulation: Goal: Signs and symptoms of infection will decrease Outcome: Progressing WBC 15.1. Afebrile in ICU.  Zosyn and Vancomycin. Wound culture sent.  Problem: Respiratory: Goal: Ability to maintain adequate ventilation will improve Outcome: Progressing Off BIPAP to 2L/[place]. Lung sounds diminished. Pt to have HD tomorrow. Seen by Dr Candiss Norse  Problem: Activity: Goal: Ability to tolerate increased activity will improve Outcome: Progressing On bedrest. Deconditioned. Has been patient at [facility] rehab. Right drop foot noted. States she is unable to bear weight either leg

## 2015-05-20 NOTE — ED Notes (Signed)
MD at bedside. 

## 2015-05-20 NOTE — Progress Notes (Signed)
Pt. Placed on 2L nasal cannula. Sa02 were 94. Pt. Is comfortable.

## 2015-05-20 NOTE — Progress Notes (Signed)
Subjective:  Patient known to our practice from previous admission Patient labs from [place] for shortness of breath which improved with breathing treatments.  Initially she was on noninvasive positive pressure ventilation which was able to be weaned of pretty quickly At present, patient denies any acute shortness of breath Nursing staff has noticed pus over the right femoral wound site Patient's lastdialysis treatment was on Saturday    Objective:  Vital signs in last 24 hours:  Temp:  [97.9 F (36.6 C)-102 F (38.9 C)] 97.9 F (36.6 C) (02/20 1100) Pulse Rate:  [95-147] 97 (02/20 1120) Resp:  [12-30] 15 (02/20 1120) BP: (99-166)/(61-99) 115/63 mmHg (02/20 1159) SpO2:  [91 %-100 %] 94 % (02/20 1120) FiO2 (%):  [2 %-40 %] 2 % (02/20 1120) Weight:  [59 kg (130 lb 1.1 oz)-62.596 kg (138 lb)] 59 kg (130 lb 1.1 oz) (02/20 1100)  Weight change:  Filed Weights   05/20/15 0747 05/20/15 1100  Weight: 62.596 kg (138 lb) 59 kg (130 lb 1.1 oz)    Intake/Output:   No intake or output data in the 24 hours ending 05/20/15 1204   Physical Exam: General: no acute distress, lying in the bed  HEENT Anicteric, mouth dry  Neck supple  Pulm/lungs Mild diffuse wheezing, few basilar crackles, nasal cannula oxygen  CVS/Heart Irregular rhythm, tachycardic  Abdomen:  Soft, nontender  Extremities: 2+ dependent pitting edema over thighs  Neurologic: Alert, oriented  Skin:   Access: Right IJ PermCath       Basic Metabolic Panel:   Recent Labs Lab 05/20/15 0758  NA 137  K 3.0*  CL 97*  CO2 27  GLUCOSE 152*  BUN 29*  CREATININE 3.45*  CALCIUM 8.6*     CBC:  Recent Labs Lab 05/20/15 0758  WBC 15.1*  HGB 10.5*  HCT 31.7*  MCV 98.8  PLT 483*      Microbiology:  No results found for this or any previous visit (from the past 720 hour(s)).  Coagulation Studies: No results for input(s): LABPROT, INR in the last 72 hours.  Urinalysis: No results for  input(s): COLORURINE, LABSPEC, PHURINE, GLUCOSEU, HGBUR, BILIRUBINUR, KETONESUR, PROTEINUR, UROBILINOGEN, NITRITE, LEUKOCYTESUR in the last 72 hours.  Invalid input(s): APPERANCEUR    Imaging: Dg Chest Port 1 View  05/20/2015  CLINICAL DATA:  65 year old female with respiratory distress and lower extremity swelling. EXAM: PORTABLE CHEST 1 VIEW COMPARISON:  Radiograph dated 04/17/2015 FINDINGS: A dual lumen dialysis catheter is noted in stable positioning. There has been interval removal of the left IJ central line. Single portable view of the chest demonstrates emphysematous changes lungs. There are bilateral hazy densities, right greater left. There has been interval improvement of the interstitial prominence, likely interval improvement in pulmonary edema. Small left pleural effusion may be present. The cardiac silhouette is within normal limits. No acute osseous pathology identified. IMPRESSION: Interval removal of the left IJ central venous line. Interval improvement of the previously mild pulmonary edema. Bibasilar hazy airspace densities, right appearing left. Electronically Signed   By: Anner Crete M.D.   On: 05/20/2015 08:30     Medications:   . sodium chloride 50 mL/hr at 05/20/15 1150   . acetaminophen  1,000 mg Oral 3 times per day  . amLODipine  5 mg Oral Daily  . aspirin EC  81 mg Oral Daily  . atorvastatin  40 mg Oral Daily  . docusate sodium  100 mg Oral BID  . famotidine  20 mg Oral Daily  .  fluticasone  2 spray Each Nare Daily  . hydrALAZINE  10 mg Oral Q6H  . irbesartan  75 mg Oral BID  . [START ON 05/21/2015] levothyroxine  88 mcg Oral Daily  . metoprolol  50 mg Oral 3 times per day  . mometasone-formoterol  2 puff Inhalation BID  . multivitamin  1 tablet Oral Daily  . nitroGLYCERIN      . piperacillin-tazobactam (ZOSYN)  IV  3.375 g Intravenous Q12H  . sodium chloride  1 spray Each Nare 5 X Daily  . vancomycin  500 mg Intravenous Once   acetaminophen **OR**  acetaminophen, albuterol, bisacodyl, chlorpheniramine-HYDROcodone, guaiFENesin-dextromethorphan, HYDROcodone-acetaminophen, loperamide, magnesium hydroxide, morphine injection, ondansetron **OR** ondansetron (ZOFRAN) IV, ondansetron, promethazine, senna-docusate  Assessment/ Plan:  65 y.o. female with COPD, hypertension, hyperlipidemia, vaginal intraepithelial neoplasia, emergent AAA repair status post rupture on November 18, left to right femoral to femoral bypass , acute renal failure leading to permanent dialysis, significant 3 vessel disease,   1. End-stage renal disease. [place]. TTS. [facility] nephrology -  Last dialysis treatment was Saturday - Electrolytes and volume status are acceptable Plan for dialysis tomorrow  2. peripheral edema - Plan for volume removal tomorrow with dialysis  May need to use IV albumin - may d/c iv fluids in 6 hrs  3. Hypertension and peripheral edema:  - BP is low normal - agree with holding anti-HTN meds - goal BP approx [id number] systolic to allow volume removal with HD tomorrow  4. Anemia of chronic kidney disease:   - epo with dialysis   5. Secondary Hyperparathyroidism:   - monitor phos   6. 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   Pack cathter with citrate   LOS: 0 Sven Pinheiro 2/20/201712:04 PM
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
## 2015-05-20 NOTE — ED Notes (Signed)
Pt placed on CPAP by RT.
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
## 2015-05-20 NOTE — ED Notes (Signed)
Pt arrives via EMS from [facility] care, staff states pt had severe SOB at 0720 this AM, gave a breathing tx, EMs gave 2 duonebs and pt arrives on cpap, pt awake, lower extremity swelling, hx of COPD, MD at bedside

## 2015-05-21 ENCOUNTER — Inpatient Hospital Stay: Payer: Medicare Other

## 2015-05-21 ENCOUNTER — Other Ambulatory Visit: Payer: Medicare Other

## 2015-05-21 ENCOUNTER — Encounter: Payer: Self-pay | Admitting: Radiology

## 2015-05-21 DIAGNOSIS — R0600 Dyspnea, unspecified: Secondary | ICD-10-CM

## 2015-05-21 DIAGNOSIS — Z992 Dependence on renal dialysis: Secondary | ICD-10-CM

## 2015-05-21 DIAGNOSIS — N186 End stage renal disease: Secondary | ICD-10-CM

## 2015-05-21 MED ORDER — FAMOTIDINE 20 MG PO TABS
20.0000 mg | ORAL_TABLET | ORAL | Status: DC
  Administered 2015-05-23 – 2015-05-31 (×5): 20 mg via ORAL
  Filled 2015-05-21 (×5): qty 1

## 2015-05-21 MED ORDER — IOHEXOL 300 MG/ML  SOLN
80.0000 mL | Freq: Once | INTRAMUSCULAR | Status: AC | PRN
Start: 2015-05-21 — End: 2015-05-21
  Administered 2015-05-21: 20:00:00 80 mL via INTRAVENOUS

## 2015-05-21 MED ORDER — IOHEXOL 240 MG/ML SOLN
50.0000 mL | INTRAMUSCULAR | Status: AC
  Administered 2015-05-21 (×2): 50 mL via ORAL

## 2015-05-21 MED ORDER — ANTICOAGULANT SODIUM CITRATE 4% (200MG/5ML) IV SOLN
5.0000 mL | Freq: Every day | Status: DC | PRN
  Filled 2015-05-21 (×2): qty 250

## 2015-05-21 NOTE — Consult Note (Signed)
PULMONARY / CRITICAL CARE MEDICINE   Name: [name] MRN: [id number] DOB: 12-17-1950    ADMISSION DATE:  05/20/2015   CONSULTATION DATE:  05/20/15  REFERRING MD:  Dr Posey Pronto  PT PROFILE: 36 F with ESRD and peripheral vasc dz underwent aorto-bifem bypass recently @ [facility]. She developed a R groin seroma and underwent drainage with muscle flap and wound vac placement @ [place] in Jan 2017 by Dr Delana Meyer. Admitted via ED 02/20 with severe sepsis, R groin wound infection, possible RLL PNA  HISTORY OF PRESENT ILLNESS:   [name] is a 65 y.o. female with a PMH of chronic diastolic congestive heart failure, ESRD on hemodialysis, COPD, and hypertension who presented to the ED with a  fever of 102.3 and  shortness of breath. She was hypoxic and briefly placed on BiPAP. She recently underwent I&D, muscle flap and a wound VAC placement in January 2017 for a groin seroma that was a complication of a recent aortobifem bypass performed prior to that @ [facility]. Her R groin wound currently is draining cloudy/purulent fluid and the surrounding skin is erythematous and tender. Also, her CXR She was admitted for sepsis secondary to pneumonia.  SUBJECIVE: Patient states that today she has increased shortness of breath, she was supposed to have a CT of chest done this morning however given the appearance of flash pulmonary edema on her chest x-ray within the last 24 hours, she will have dialysis first followed by CT of chest.   PAST MEDICAL HISTORY :  She  has a past medical history of Hyperlipidemia; Hypertension; Thyroid disease; GERD (gastroesophageal reflux disease); Aneurysm ([place]); STD (sexually transmitted disease); VAIN II (vaginal intraepithelial neoplasia grade II) (7/09); Granuloma annulare (2010); B12 deficiency; COPD (chronic obstructive pulmonary disease) ([place]); Renal insufficiency; and Cancer ([place]).  PAST SURGICAL HISTORY: She  has past surgical history that includes Tonsillectomy;  Hypothenar fat pad transfer (Right, 1986); Total abdominal hysterectomy (1990); Bladder surgery (1998); Nasal septum surgery (1969); precancerous lesions removed from forehead; Colon resection (2/16); Colon surgery; Femoral-femoral Bypass Graft (Bilateral, 04/17/2015); and Central venous catheter insertion (Left, 04/17/2015).  Allergies  Allergen Reactions  . Asa [Aspirin]   . 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 Plavix [Clopidogrel]     No current facility-administered medications on file prior to encounter.   Current Outpatient Prescriptions on File Prior to Encounter  Medication Sig  . acetaminophen (TYLENOL) 500 MG tablet Take 1,000 mg by mouth every 8 (eight) hours.  Marland Kitchen albuterol (PROVENTIL) (2.5 MG/3ML) 0.083% nebulizer solution Take 2.5 mg by nebulization every 6 (six) hours as needed for wheezing.  Marland Kitchen amLODipine (NORVASC) 5 MG tablet Take 1 tablet (5 mg total) by mouth daily.  Marland Kitchen aspirin EC 81 MG tablet Take 81 mg by mouth daily.  Marland Kitchen atorvastatin (LIPITOR) 40 MG tablet Take 40 mg by mouth daily.  . chlorpheniramine-HYDROcodone (TUSSIONEX) 10-8 MG/5ML SUER Take 5 mLs by mouth every 12 (twelve) hours as needed for cough.  . docusate sodium (COLACE) 100 MG capsule Take 100 mg by mouth 2 (two) times daily.   . famotidine (PEPCID) 20 MG tablet Take 20 mg by mouth daily.  . fluticasone (FLONASE) 50 MCG/ACT nasal spray Place 2 sprays into both nostrils daily.  Marland Kitchen guaiFENesin-dextromethorphan (ROBITUSSIN DM) 100-10 MG/5ML syrup Take 10 mLs by mouth every 4 (four) hours as needed for cough.  . hydrALAZINE (APRESOLINE) 10 MG tablet Take 10 mg by mouth every 6 (six) hours.  . irbesartan (  AVAPRO) 75 MG tablet Take 1 tablet (75 mg total) by mouth 2 (two) times daily.  Marland Kitchen levothyroxine (SYNTHROID, LEVOTHROID) 88 MCG tablet Take 88 mcg by mouth daily.  . magnesium hydroxide (MILK OF MAGNESIA) 400 MG/5ML suspension Take 30 mLs by mouth daily as needed for mild constipation.  .  metoprolol (LOPRESSOR) 50 MG tablet Take 1 tablet (50 mg total) by mouth every 8 (eight) hours.  . multivitamin (RENA-VIT) TABS tablet Take 1 tablet by mouth daily.  . ondansetron (ZOFRAN) 4 MG tablet Take 4 mg by mouth every 6 (six) hours as needed for nausea or vomiting.  . promethazine (PHENERGAN) 25 MG/ML injection Inject 25 mg into the vein every 6 (six) hours as needed for nausea or vomiting.  . sodium chloride (OCEAN) 0.65 % SOLN nasal spray Place 1 spray into both nostrils 5 (five) times daily.    FAMILY HISTORY:  Her indicated that her mother is deceased. She indicated that her father is deceased. She indicated that her sister is deceased. She indicated that her other is alive.   SOCIAL HISTORY: She  reports that she quit smoking about 3 months ago. She has never used smokeless tobacco. She reports that she does not drink alcohol or use illicit drugs.  REVIEW OF SYSTEMS:   Denies head and neck symptoms, chest pain, palpitations, abd pain, N/V/D, dysuria, LE edema and calf tenderness Admits to SOB  VITAL SIGNS: BP 145/66 mmHg  Pulse 100  Temp(Src) 98.2 F (36.8 C) (Oral)  Resp 24  Ht 5\' 4"  (1.626 m)  Wt 138 lb 10.7 oz (62.9 kg)  BMI 23.79 kg/m2  SpO2 94%  LMP 03/30/1988  HEMODYNAMICS:    VENTILATOR SETTINGS:    INTAKE / OUTPUT: I/O last 3 completed shifts: In: 658.3 [I.V.:258.3; IV Piggyback:400] Out: -   PHYSICAL EXAMINATION: General:  Chronically ill appearing in NAD off BiPAP Neuro: CNs, motor, sensory, DTRs intact HEENT: NCAT Cardiovascular: Reg, no M Lungs: barrel chested, Left basilar crackles.  Abdomen: soft, NT, + BS Ext: warm, no edema R inguinal wound with scant purulent drainage and surrounding erythema  LABS:  BMET  Recent Labs Lab 05/20/15 0758  NA 137  K 3.0*  CL 97*  CO2 27  BUN 29*  CREATININE 3.45*  GLUCOSE 152*    Electrolytes  Recent Labs Lab 05/20/15 0758  CALCIUM 8.6*    CBC  Recent Labs Lab 05/20/15 0758  WBC  15.1*  HGB 10.5*  HCT 31.7*  PLT 483*    Coag's No results for input(s): APTT, INR in the last 168 hours.  Sepsis Markers No results for input(s): LATICACIDVEN, PROCALCITON, O2SATVEN in the last 168 hours.  ABG  Recent Labs Lab 05/20/15 0810  PHART 7.44  PCO2ART 41  PO2ART 72*    Liver Enzymes  Recent Labs Lab 05/20/15 0758  AST 36  ALT 13*  ALKPHOS 124  BILITOT 0.9  ALBUMIN 2.7*    Cardiac Enzymes  Recent Labs Lab 05/20/15 0758  TROPONINI 0.59*    Glucose  Recent Labs Lab 05/20/15 1101  GLUCAP 89    Imaging No results found.   STUDIES:    CULTURES: Wound 2/20 >>   ANTIBIOTICS: Vanc 02/20 >>  Pip-tazo 02/20 >>   SIGNIFICANT EVENTS:   LINES/TUBES:   ASSESSMENT / PLAN: 65 year old female with ESRD, on HD, recent aortobifem bypass, complicated by seroma at [facility], required second procedure and wound VAC. Admitted to [facility] on 2/20 with severe sepsis-like picture due to right  groin infection,  PULMONARY A: Acute respiratory distress, slowly improving P:   Supplemental O2 as needed to maintain SpO2 > 92% Cont PRN BiPAP HD today may assist with current dyspnea. Consider CT Chest after HD   CARDIOVASCULAR A:  Hypotension - suspect sepsis related P:  Decrease metoprolol dose (not stopping completely to prevent rebound tachycardia) Hold other antihypertensive meds  MAP goal > 60 mmHg   RENAL A:   ESRD P:   Monitor BMET intermittently Monitor I/Os Correct electrolytes as indicated Renal Service following  GASTROINTESTINAL A:   No issues P:   SUP: enteral famotidine Cont diet PO  HEMATOLOGIC A:   Very mild anemia - likely due to CKD P:  DVT px: SQ heparin Monitor CBC intermittently Transfuse per usual guidelines  INFECTIOUS A:   Severe sepsis - likely source is R groin infection P:   Monitor temp, WBC count Micro and abx as above Unfortunately, it appears that BCs were not drawn on admission Vas Surg  following, appreciate recs.    ENDOCRINE A:   Mild stress induced hyperglycemia without prior dx of DM P:   Monitor glu on chem panels Consider SSI for glu > 180  NEUROLOGIC A:   No issues P:   RASS goal: 0 monitor   I have personally obtained a history, examined the patient, evaluated laboratory and imaging results, formulated the assessment and plan and placed orders.  Pulmonary Care Time devoted to patient care services described in this note is 40 minutes.    Vilinda Boehringer, MD [facility] Pager [phone] (please enter 7-digits) On Call Pager - [phone] (please enter 7-digits)    05/21/2015, 2:03 PM

## 2015-05-21 NOTE — Clinical Social Work Note (Signed)
CSW consulted as pt was admitted from [facility]. CSW met with pt. Full assessment to follow. Pt is able to facility to facility when medically stable. CSW will continue to follow.   Darden Dates, MSW, LCSW  Clinical Social Worker  [phone]
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
## 2015-05-21 NOTE — NC FL2 (Signed)
[place] LEVEL OF CARE SCREENING TOOL     IDENTIFICATION  Patient Name: [name] Birthdate: 20-Oct-1950 Sex: female Admission Date (Current Location): 05/20/2015  [place] and [place] Number:  Engineering geologist and Address:  [facility], [address] [place], [place] [address]      Provider Number: [id number]  Attending Physician Name and Address:  Fritzi Mandes, MD  Relative Name and Phone Number:       Current Level of Care: Hospital Recommended Level of Care: [place] Prior Approval Number:    Date Approved/Denied:   PASRR Number: [id number]  Discharge Plan: SNF    Current Diagnoses: Patient Active Problem List   Diagnosis Date Noted  . Sepsis ([place]) 05/20/2015  . Bilateral pleural effusion 04/22/2015  . Anasarca 04/22/2015  . Seroma complicating procedure [id number]  . Thrombocytopenia ([place]) 04/22/2015  . Anemia of chronic disease 04/22/2015  . H/O blood transfusion reaction 04/22/2015  . HIT (heparin-induced thrombocytopenia) ([place]) 04/22/2015  . Generalized weakness 04/22/2015  . Malnutrition of moderate degree 04/19/2015  . Acute pulmonary edema with congestive heart failure ([place]) 04/09/2015  . Leukocytosis 04/09/2015  . Malignant essential hypertension 04/09/2015  . Dysphagia 04/09/2015  . Elevated troponin 04/09/2015  . Acute diastolic CHF (congestive heart failure) ([place]) 04/09/2015  . COPD exacerbation ([place]) 01/03/2015  . Alopecia 09/24/2014  . VAIN II (vaginal intraepithelial neoplasia grade II) 11/22/2013    Class: History of  . Postmenopausal HRT (hormone replacement therapy) 09/18/2013  . Pernicious anemia 09/18/2013  . Elevated liver function tests 09/14/2011  . Hypothyroidism 05/30/2010  . HLD (hyperlipidemia) 05/30/2010  . TOBACCO ABUSE 05/30/2010  . Essential hypertension 05/30/2010  . Abdominal aortic aneurysm ([place]) 05/30/2010  . COPD 05/30/2010  . GERD 05/30/2010     Orientation RESPIRATION BLADDER Height & Weight     Self, Time, Situation, Place  O2 (2L) Incontinent Weight: 138 lb 10.7 oz (62.9 kg) Height:  5\' 4"  (162.6 cm)  BEHAVIORAL SYMPTOMS/MOOD NEUROLOGICAL BOWEL NUTRITION STATUS      Incontinent Diet (Soft Diet, Thin Liquids)  AMBULATORY STATUS COMMUNICATION OF NEEDS Skin   Limited Assist Verbally Normal                       Personal Care Assistance Level of Assistance  Bathing, Feeding, Dressing Bathing Assistance: Limited assistance Feeding assistance: Limited assistance Dressing Assistance: Limited assistance     Functional Limitations Info  Sight, Hearing, Speech Sight Info: Adequate Hearing Info: Adequate Speech Info: Adequate    SPECIAL CARE FACTORS FREQUENCY                       Contractures      Additional Factors Info  Code Status, Allergies Code Status Info: Full Code Allergies Info: ASA, Heparin, Plavix           Current Medications (05/21/2015):  This is the current hospital active medication list Current Facility-Administered Medications  Medication Dose Route Frequency Provider Last Rate Last Dose  . acetaminophen (TYLENOL) tablet 650 mg  650 mg Oral Q6H PRN Fritzi Mandes, MD       Or  . acetaminophen (TYLENOL) suppository 650 mg  650 mg Rectal Q6H PRN Fritzi Mandes, MD      . acetaminophen (TYLENOL) tablet 1,000 mg  1,000 mg Oral 3 times per day Fritzi Mandes, MD   1,000 mg at 05/20/15 2149  . albuterol (PROVENTIL) (2.5 MG/3ML) 0.083% nebulizer solution 2.5  mg  2.5 mg Nebulization Q6H PRN Fritzi Mandes, MD   2.5 mg at 05/21/15 1146  . anticoagulant sodium citrate solution 5 mL  5 mL Intravenous Daily PRN Murlean Iba, MD      . antiseptic oral rinse (CPC / CETYLPYRIDINIUM CHLORIDE 0.05%) solution 7 mL  7 mL Mouth Rinse BID Vaughan Basta, MD   7 mL at 05/21/15 1100  . aspirin EC tablet 81 mg  81 mg Oral Daily Fritzi Mandes, MD   81 mg at 05/21/15 0803  . atorvastatin (LIPITOR) tablet 40 mg  40 mg  Oral Daily Fritzi Mandes, MD   40 mg at 05/21/15 0803  . bisacodyl (DULCOLAX) EC tablet 5 mg  5 mg Oral Daily PRN Fritzi Mandes, MD      . chlorpheniramine-HYDROcodone (TUSSIONEX) 10-8 MG/5ML suspension 5 mL  5 mL Oral Q12H PRN Fritzi Mandes, MD      . docusate sodium (COLACE) capsule 100 mg  100 mg Oral BID Fritzi Mandes, MD   100 mg at 05/20/15 2149  . [START ON 05/23/2015] famotidine (PEPCID) tablet 20 mg  20 mg Oral Q48H Fritzi Mandes, MD      . fluticasone (FLONASE) 50 MCG/ACT nasal spray 2 spray  2 spray Each Nare Daily Fritzi Mandes, MD   2 spray at 05/21/15 0805  . guaiFENesin-dextromethorphan (ROBITUSSIN DM) 100-10 MG/5ML syrup 10 mL  10 mL Oral Q4H PRN Fritzi Mandes, MD      . HYDROcodone-acetaminophen (NORCO/VICODIN) 5-325 MG per tablet 1-2 tablet  1-2 tablet Oral Q4H PRN Fritzi Mandes, MD   1 tablet at 05/21/15 1236  . levothyroxine (SYNTHROID, LEVOTHROID) tablet 88 mcg  88 mcg Oral Daily Fritzi Mandes, MD   88 mcg at 05/21/15 [phone]  . loperamide (IMODIUM) capsule 2 mg  2 mg Oral Q4H PRN Fritzi Mandes, MD      . magnesium hydroxide (MILK OF MAGNESIA) suspension 30 mL  30 mL Oral Daily PRN Fritzi Mandes, MD      . metoprolol tartrate (LOPRESSOR) tablet 25 mg  25 mg Oral 3 times per day Wilhelmina Mcardle, MD   25 mg at 05/21/15 [phone]  . mometasone-formoterol (DULERA) 100-5 MCG/ACT inhaler 2 puff  2 puff Inhalation BID Fritzi Mandes, MD   2 puff at 05/21/15 0804  . morphine 2 MG/ML injection 0.5 mg  0.5 mg Intravenous Q4H PRN Fritzi Mandes, MD      . multivitamin (RENA-VIT) tablet 1 tablet  1 tablet Oral Daily Fritzi Mandes, MD   1 tablet at 05/21/15 0804  . ondansetron (ZOFRAN) tablet 4 mg  4 mg Oral Q6H PRN Fritzi Mandes, MD       Or  . ondansetron (ZOFRAN) injection 4 mg  4 mg Intravenous Q6H PRN Fritzi Mandes, MD      . ondansetron (ZOFRAN) tablet 4 mg  4 mg Oral Q6H PRN Fritzi Mandes, MD      . piperacillin-tazobactam (ZOSYN) IVPB 3.375 g  3.375 g Intravenous Q12H Fritzi Mandes, MD   3.375 g at 05/21/15 [id number]  . promethazine (PHENERGAN)  injection 12.5 mg  12.5 mg Intravenous Q6H PRN Fritzi Mandes, MD      . senna-docusate (Senokot-S) tablet 1 tablet  1 tablet Oral QHS PRN Fritzi Mandes, MD      . sodium chloride (OCEAN) 0.65 % nasal spray 1 spray  1 spray Each Nare 5 X Daily Fritzi Mandes, MD   1 spray at 05/21/15 0804  . vancomycin (VANCOCIN) 500 mg in sodium chloride 0.9 %  100 mL IVPB  500 mg Intravenous Q T,Th,Sa-HD Fritzi Mandes, MD         Discharge Medications: Please see discharge summary for a list of discharge medications.  Relevant Imaging Results:  Relevant Lab Results:   Additional Information SSN:  [id number]  Darden Dates, LCSW

## 2015-05-21 NOTE — Progress Notes (Signed)
MEDICATION RELATED CONSULT NOTE - INITIAL   Pharmacy Consult for Medication Renal Adjustment   Allergies  Allergen Reactions  . Asa [Aspirin]   . 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 Plavix [Clopidogrel]     Patient Measurements: Height: 5\' 4"  (162.6 cm) Weight: 138 lb 10.7 oz (62.9 kg) IBW/kg (Calculated) : 54.7  Vital Signs: Temp: 98.2 F (36.8 C) (02/21 1328) Temp Source: Oral (02/21 1328) BP: 145/66 mmHg (02/21 1400) Pulse Rate: 100 (02/21 1400) Intake/Output from previous day: 02/20 0701 - 02/21 0700 In: 658.3 [I.V.:258.3; IV Piggyback:400] Out: -  Intake/Output from this shift: Total I/O In: 240 [P.O.:240] Out: -   Labs:  Recent Labs  05/20/15 0758  WBC 15.1*  HGB 10.5*  HCT 31.7*  PLT 483*  CREATININE 3.45*  ALBUMIN 2.7*  PROT 7.2  AST 36  ALT 13*  ALKPHOS 124  BILITOT 0.9   Estimated Creatinine Clearance: 13.9 mL/min (by C-G formula based on Cr of 3.45).   Microbiology: Recent Results (from the past 720 hour(s))  Wound culture     Status: None (Preliminary result)   Collection Time: 05/20/15 12:25 PM  Result Value Ref Range Status   Specimen Description GROIN RIGHT  Final   Special Requests NONE  Final   Gram Stain   Final    FEW WBC SEEN FEW GRAM NEGATIVE RODS RARE GRAM POSITIVE COCCI    Culture HOLDING FOR POSSIBLE PATHOGEN  Final   Report Status PENDING  Incomplete  MRSA PCR Screening     Status: None   Collection Time: 05/20/15 12:30 PM  Result Value Ref Range Status   MRSA by PCR NEGATIVE NEGATIVE Final    Comment:        The GeneXpert MRSA Assay (FDA approved for NASAL specimens only), is one component of a comprehensive MRSA colonization surveillance program. It is not intended to diagnose MRSA infection nor to guide or monitor treatment for MRSA infections.     Medical History: Past Medical History  Diagnosis Date  . Hyperlipidemia   . Hypertension   . Thyroid disease   . GERD  (gastroesophageal reflux disease)   . Aneurysm ([place])     abdominal aortic  . STD (sexually transmitted disease)     chlamydia  . VAIN II (vaginal intraepithelial neoplasia grade II) 7/09  . 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 B12 deficiency   . COPD (chronic obstructive pulmonary disease) ([place])   . Renal insufficiency   . Cancer ([place])     skin on left ankle 04/10/15 pt states she has never had cancer    Medications:  Scheduled:  . acetaminophen  1,000 mg Oral 3 times per day  . antiseptic oral rinse  7 mL Mouth Rinse BID  . aspirin EC  81 mg Oral Daily  . atorvastatin  40 mg Oral Daily  . docusate sodium  100 mg Oral BID  . [START ON 05/23/2015] famotidine  20 mg Oral Q48H  . fluticasone  2 spray Each Nare Daily  . levothyroxine  88 mcg Oral Daily  . metoprolol  25 mg Oral 3 times per day  . mometasone-formoterol  2 puff Inhalation BID  . multivitamin  1 tablet Oral Daily  . piperacillin-tazobactam (ZOSYN)  IV  3.375 g Intravenous Q12H  . sodium chloride  1 spray Each Nare 5 X Daily  . vancomycin  500 mg Intravenous Q T,Th,Sa-HD   Infusions:  Assessment: Pharmacy consulted to assist in renally dosing medications in this 65 y/o F with sepsis.   Plan:  Will adjust famotidine to 20 mg q 48 hours. No other medications require adjustment at present. Pharmacy will continue to follow and adjust per protocol.   Ulice Dash D 05/21/2015,2:22 PM

## 2015-05-21 NOTE — Progress Notes (Signed)
[facility] NAME: [name]    MR#:  [id number]  DATE OF BIRTH:  1950/04/07  SUBJECTIVE:  Feels little better continues to have shortness of breath.  REVIEW OF SYSTEMS:   Review of Systems  Constitutional: Negative for fever, chills and weight loss.  HENT: Negative for ear discharge, ear pain and nosebleeds.   Eyes: Negative for blurred vision, pain and discharge.  Respiratory: Negative for sputum production, shortness of breath, wheezing and stridor.   Cardiovascular: Negative for chest pain, palpitations, orthopnea and PND.  Gastrointestinal: Negative for nausea, vomiting, abdominal pain and diarrhea.  Genitourinary: Negative for urgency and frequency.  Musculoskeletal: Negative for back pain and joint pain.  Skin:       Right groin swelling and inflammation and discharge  Neurological: Negative for sensory change, speech change, focal weakness and weakness.  Psychiatric/Behavioral: Negative for depression and hallucinations. The patient is not nervous/anxious.    Tolerating Diet: Yes Tolerating PT: Pending  DRUG ALLERGIES:   Allergies  Allergen Reactions  . Asa [Aspirin]   . 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 Plavix [Clopidogrel]     VITALS:  Blood pressure 152/64, pulse 74, temperature 97.2 F (36.2 C), temperature source Oral, resp. rate 19, height 5\' 4"  (1.626 m), weight 61.644 kg (135 lb 14.4 oz), last menstrual period 03/30/1988, SpO2 94 %.  PHYSICAL EXAMINATION:   Physical Exam  GENERAL:  65 y.o.-year-old patient lying in the bed with no acute distress.  EYES: Pupils equal, round, reactive to light and accommodation. No scleral icterus. Extraocular muscles intact.  HEENT: Head atraumatic, normocephalic. Oropharynx and nasopharynx clear.  NECK:  Supple, no jugular venous distention. No thyroid enlargement, no tenderness.  LUNGS: Distant breath sounds bilaterally, no  wheezing, rales, rhonchi. No use of accessory muscles of respiration.  CARDIOVASCULAR: S1, S2 normal. No murmurs, rubs, or gallops.  ABDOMEN: Soft, nontender, nondistended. Bowel sounds present. No organomegaly or mass.  EXTREMITIES: No cyanosis, clubbing or edema b/l.   Right groin swelling with serosanguineous discharge NEUROLOGIC: Cranial nerves II through XII are intact. No focal Motor or sensory deficits b/l.   PSYCHIATRIC:  patient is alert and oriented x 3.  SKIN: No obvious rash, lesion, or ulcer.   LABORATORY PANEL:  CBC  Recent Labs Lab 05/20/15 0758  WBC 15.1*  HGB 10.5*  HCT 31.7*  PLT 483*    Chemistries   Recent Labs Lab 05/20/15 0758  NA 137  K 3.0*  CL 97*  CO2 27  GLUCOSE 152*  BUN 29*  CREATININE 3.45*  CALCIUM 8.6*  AST 36  ALT 13*  ALKPHOS 124  BILITOT 0.9   Cardiac Enzymes  Recent Labs Lab 05/20/15 0758  TROPONINI 0.59*   RADIOLOGY:  Dg Chest Port 1 View  05/20/2015  CLINICAL DATA:  65 year old female with respiratory distress and lower extremity swelling. EXAM: PORTABLE CHEST 1 VIEW COMPARISON:  Radiograph dated 04/17/2015 FINDINGS: A dual lumen dialysis catheter is noted in stable positioning. There has been interval removal of the left IJ central line. Single portable view of the chest demonstrates emphysematous changes lungs. There are bilateral hazy densities, right greater left. There has been interval improvement of the interstitial prominence, likely interval improvement in pulmonary edema. Small left pleural effusion may be present. The cardiac silhouette is within normal limits. No acute osseous pathology identified. IMPRESSION: Interval removal of the left IJ central venous line. Interval improvement of  the previously mild pulmonary edema. Bibasilar hazy airspace densities, right appearing left. Electronically Signed   By: Anner Crete M.D.   On: 05/20/2015 08:30   ASSESSMENT AND PLAN:  [name] is a 65 y.o. female with a  known history of chronic diastolic congestive heart failure, incisional disease on hemodialysis, history of HIT,, anemia of chronic disease, history of right groin seroma after vascular procedures status post wound VAC placement in January 2017 comes to the emergency room from [facility] with fever of 102.3 shortness of breath. Patient was found to have sepsis with right lower lobe pneumonia. She was found to be hypoxic with sats 72% on ABG.  1. Sepsis secondary to right lobe pneumonia hospital-acquired (patient was admitted and discharge in January 2017) and severe right groin infection of seroma recurrent -Broad spectrum antibiotics with vancomycin and Zosyn -Follow blood cultures sputum culture -We will wean BiPAP as patient's sats remained stable -Pulmonary consultation appreciated. -Seen by Dr. deal from vascular surgery patient possibly will need exploration of right groin seroma -wound culture growing GPC and GNR  2. Acute on chronic diastolic congestive heart failure -Elevated BNP with shortness of breath and pulmonary edema as noted on chest x-ray -Consider hemodialysis with ultrafiltration -Continue cardiac meds  3. Hypertension Continue amlodipine, metoprolol, Avapro, hydralazine  4. History of HIT -Avoid antiplatelet agents -SCD and teds for DVT prophylaxis  5. End-stage renal disease on hemodialysis -Nephrology consultation for in-house HD  6. Hyperlipidemia continue statins  7. Anemia of chronic disease  -Hemoglobin appear stable transfuse as needed   8. Right groin seroma status post I&D in January 2017 Continue dressing changes. Patient has serosanguineous discharge and with positive one culture will need further exploration and possible wound VAC placement -Seen by vascular appreciate their input   Case discussed with Care Management/Social Worker. Management plans discussed with the patient, family and they are in agreement.  CODE STATUS: Full  DVT  Prophylaxis: SCD and teds  TOTAL TIME TAKING CARE OF THIS PATIENT: 35 minutes.  >50% time spent on counselling and coordination of care  POSSIBLE D/C IN 2-3 DAYS, DEPENDING ON CLINICAL CONDITION.  Note: This dictation was prepared with Dragon dictation along with smaller phrase technology. Any transcriptional errors that result from this process are unintentional.  Atiyah Bauer M.D on 05/21/2015 at 1:01 PM  Between 7am to 6pm - Pager - [phone]  After 6pm go to [email or web address] - password [email or web address] [facility]  Office  [phone]  CC: Primary care physician; Arnette Norris, MD

## 2015-05-21 NOTE — Progress Notes (Signed)
Subjective:  Patient known to our practice from previous admission Reports SOB this AM Not able to lay flat CT of abdomen planned    Objective:  Vital signs in last 24 hours:  Temp:  [97.2 F (36.2 C)-98.2 F (36.8 C)] 98.2 F (36.8 C) (02/21 1328) Pulse Rate:  [74-106] 100 (02/21 1400) Resp:  [13-27] 24 (02/21 1400) BP: (103-152)/(56-77) 145/66 mmHg (02/21 1400) SpO2:  [94 %-99 %] 94 % (02/21 1328) Weight:  [61.644 kg (135 lb 14.4 oz)-62.9 kg (138 lb 10.7 oz)] 62.9 kg (138 lb 10.7 oz) (02/21 1330)  Weight change:  Filed Weights   05/20/15 1100 05/21/15 0237 05/21/15 1330  Weight: 59 kg (130 lb 1.1 oz) 61.644 kg (135 lb 14.4 oz) 62.9 kg (138 lb 10.7 oz)    Intake/Output:    Intake/Output Summary (Last 24 hours) at 05/21/15 1413 Last data filed at 05/21/15 0900  Gross per 24 hour  Intake    440 ml  Output      0 ml  Net    440 ml     Physical Exam: General: no acute distress, lying in the bed  HEENT Anicteric, mouth dry  Neck supple  Pulm/lungs Mild diffuse wheezing, few basilar crackles, nasal cannula oxygen  CVS/Heart Irregular rhythm, tachycardic  Abdomen:  Soft, nontender  Extremities: 2+ dependent pitting edema over thighs  Neurologic: Alert, oriented  Skin:   Access: Right IJ PermCath       Basic Metabolic Panel:   Recent Labs Lab 05/20/15 0758  NA 137  K 3.0*  CL 97*  CO2 27  GLUCOSE 152*  BUN 29*  CREATININE 3.45*  CALCIUM 8.6*     CBC:  Recent Labs Lab 05/20/15 0758  WBC 15.1*  HGB 10.5*  HCT 31.7*  MCV 98.8  PLT 483*      Microbiology:  Recent Results (from the past 720 hour(s))  Wound culture     Status: None (Preliminary result)   Collection Time: 05/20/15 12:25 PM  Result Value Ref Range Status   Specimen Description GROIN RIGHT  Final   Special Requests NONE  Final   Gram Stain   Final    FEW WBC SEEN FEW GRAM NEGATIVE RODS RARE GRAM POSITIVE COCCI    Culture HOLDING FOR POSSIBLE PATHOGEN  Final   Report  Status PENDING  Incomplete  MRSA PCR Screening     Status: None   Collection Time: 05/20/15 12:30 PM  Result Value Ref Range Status   MRSA by PCR NEGATIVE NEGATIVE Final    Comment:        The GeneXpert MRSA Assay (FDA approved for NASAL specimens only), is one component of a comprehensive MRSA colonization surveillance program. It is not intended to diagnose MRSA infection nor to guide or monitor treatment for MRSA infections.     Coagulation Studies: No results for input(s): LABPROT, INR in the last 72 hours.  Urinalysis: No results for input(s): COLORURINE, LABSPEC, PHURINE, GLUCOSEU, HGBUR, BILIRUBINUR, KETONESUR, PROTEINUR, UROBILINOGEN, NITRITE, LEUKOCYTESUR in the last 72 hours.  Invalid input(s): APPERANCEUR    Imaging: Dg Chest Port 1 View  05/20/2015  CLINICAL DATA:  65 year old female with respiratory distress and lower extremity swelling. EXAM: PORTABLE CHEST 1 VIEW COMPARISON:  Radiograph dated 04/17/2015 FINDINGS: A dual lumen dialysis catheter is noted in stable positioning. There has been interval removal of the left IJ central line. Single portable view of the chest demonstrates emphysematous changes lungs. There are bilateral hazy densities, right greater left. There  has been interval improvement of the interstitial prominence, likely interval improvement in pulmonary edema. Small left pleural effusion may be present. The cardiac silhouette is within normal limits. No acute osseous pathology identified. IMPRESSION: Interval removal of the left IJ central venous line. Interval improvement of the previously mild pulmonary edema. Bibasilar hazy airspace densities, right appearing left. Electronically Signed   By: Anner Crete M.D.   On: 05/20/2015 08:30     Medications:     . acetaminophen  1,000 mg Oral 3 times per day  . antiseptic oral rinse  7 mL Mouth Rinse BID  . aspirin EC  81 mg Oral Daily  . atorvastatin  40 mg Oral Daily  . docusate sodium  100 mg  Oral BID  . famotidine  20 mg Oral Daily  . fluticasone  2 spray Each Nare Daily  . levothyroxine  88 mcg Oral Daily  . metoprolol  25 mg Oral 3 times per day  . mometasone-formoterol  2 puff Inhalation BID  . multivitamin  1 tablet Oral Daily  . piperacillin-tazobactam (ZOSYN)  IV  3.375 g Intravenous Q12H  . sodium chloride  1 spray Each Nare 5 X Daily  . vancomycin  500 mg Intravenous Q T,Th,Sa-HD   acetaminophen **OR** acetaminophen, albuterol, bisacodyl, chlorpheniramine-HYDROcodone, guaiFENesin-dextromethorphan, HYDROcodone-acetaminophen, loperamide, magnesium hydroxide, morphine injection, ondansetron **OR** ondansetron (ZOFRAN) IV, ondansetron, promethazine, senna-docusate  Assessment/ Plan:  65 y.o. female with COPD, hypertension, hyperlipidemia, vaginal intraepithelial neoplasia, emergent AAA repair status post rupture on November 18, left to right femoral to femoral bypass , acute renal failure leading to permanent dialysis, significant 3 vessel disease,   1. End-stage renal disease. [place]. TTS. [facility] nephrology -  Last dialysis treatment was Saturday -  Urgent dialysis today prior to CT scan - may need extra treatment tomorrow after CT contrast  2. SOB - Pulmonary edema contributing   3. Hypertension and peripheral edema:  - BP is better controlled - agree with holding anti-HTN meds - goal BP approx [id number] systolic to allow volume removal with HD    4. Anemia of chronic kidney disease:   - epo with dialysis   5. Secondary Hyperparathyroidism:   - monitor phos   6. 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   Pack cathter with citrate  7. Hypokalemia - 4 K bath for HD    LOS: 1 Chelcea Zahn 2/21/20172:13 PM

## 2015-05-21 NOTE — Progress Notes (Signed)
Patient heart rate steadily increased while on tx despite adjustments made to to machine to help stabilize. Patient having increasing shortness of breath.  Heart rate at 140's. Dr Murlean Iba notified. Patient placed on nonrebreather. Treatment terminated as per Dr Candiss Norse. Patient condition improving. Transferred back to floor.

## 2015-05-22 ENCOUNTER — Inpatient Hospital Stay: Admission: RE | Admit: 2015-05-22 | Payer: Medicare Other | Source: Ambulatory Visit

## 2015-05-22 DIAGNOSIS — J90 Pleural effusion, not elsewhere classified: Secondary | ICD-10-CM

## 2015-05-22 MED ORDER — METOPROLOL TARTRATE 25 MG PO TABS
25.0000 mg | ORAL_TABLET | Freq: Two times a day (BID) | ORAL | Status: DC
  Administered 2015-05-22 – 2015-05-31 (×18): 25 mg via ORAL
  Filled 2015-05-22 (×18): qty 1

## 2015-05-22 NOTE — Progress Notes (Signed)
Subjective:  Patient known to our practice from previous admission Dialyzed yesterday 1200 cc removed Feels better today Exploration of groin planned for later today   Objective:  Vital signs in last 24 hours:  Temp:  [97.7 F (36.5 C)-99.9 F (37.7 C)] 97.7 F (36.5 C) (02/22 0443) Pulse Rate:  [78-140] 87 (02/22 0951) Resp:  [17-28] 18 (02/22 0443) BP: (115-178)/(47-97) 127/59 mmHg (02/22 0951) SpO2:  [91 %-100 %] 97 % (02/22 0443) Weight:  [62.9 kg (138 lb 10.7 oz)] 62.9 kg (138 lb 10.7 oz) (02/21 1330)  Weight change: 0.304 kg (10.7 oz) Filed Weights   05/20/15 1100 05/21/15 0237 05/21/15 1330  Weight: 59 kg (130 lb 1.1 oz) 61.644 kg (135 lb 14.4 oz) 62.9 kg (138 lb 10.7 oz)    Intake/Output:    Intake/Output Summary (Last 24 hours) at 05/22/15 1130 Last data filed at 05/21/15 2217  Gross per 24 hour  Intake     50 ml  Output   1279 ml  Net  -1229 ml     Physical Exam: General: no acute distress, lying in the bed  HEENT Anicteric,    Neck supple  Pulm/lungs Clear b/l  CVS/Heart Irregular rhythm, tachycardic  Abdomen:  Soft, nontender  Extremities: 2+ dependent pitting edema over thighs  Neurologic: Alert, oriented  Skin:   Access: Right IJ PermCath       Basic Metabolic Panel:   Recent Labs Lab 05/20/15 0758  NA 137  K 3.0*  CL 97*  CO2 27  GLUCOSE 152*  BUN 29*  CREATININE 3.45*  CALCIUM 8.6*     CBC:  Recent Labs Lab 05/20/15 0758  WBC 15.1*  HGB 10.5*  HCT 31.7*  MCV 98.8  PLT 483*      Microbiology:  Recent Results (from the past 720 hour(s))  Wound culture     Status: None (Preliminary result)   Collection Time: 05/20/15 12:25 PM  Result Value Ref Range Status   Specimen Description GROIN RIGHT  Final   Special Requests NONE  Final   Gram Stain   Final    FEW WBC SEEN FEW GRAM NEGATIVE RODS RARE GRAM POSITIVE COCCI    Culture   Final    RARE GRAM NEGATIVE RODS IDENTIFICATION AND SUSCEPTIBILITIES TO FOLLOW     Report Status PENDING  Incomplete  MRSA PCR Screening     Status: None   Collection Time: 05/20/15 12:30 PM  Result Value Ref Range Status   MRSA by PCR NEGATIVE NEGATIVE Final    Comment:        The GeneXpert MRSA Assay (FDA approved for NASAL specimens only), is one component of a comprehensive MRSA colonization surveillance program. It is not intended to diagnose MRSA infection nor to guide or monitor treatment for MRSA infections.     Coagulation Studies: No results for input(s): LABPROT, INR in the last 72 hours.  Urinalysis: No results for input(s): COLORURINE, LABSPEC, PHURINE, GLUCOSEU, HGBUR, BILIRUBINUR, KETONESUR, PROTEINUR, UROBILINOGEN, NITRITE, LEUKOCYTESUR in the last 72 hours.  Invalid input(s): APPERANCEUR    Imaging: Ct Abdomen Pelvis W Contrast  05/21/2015  CLINICAL DATA:  Fever of 102.3 degrees in shortness of breath, right lower lobe pneumonia and sepsis with hypoxia, right groin seroma status post incision and drainage with wound VAC with continued serosanguineous drainage, sepsis, diastolic congestive heart failure EXAM: CT ABDOMEN AND PELVIS WITH CONTRAST TECHNIQUE: Multidetector CT imaging of the abdomen and pelvis was performed using the standard protocol following bolus administration of  intravenous contrast. CONTRAST:  79mL OMNIPAQUE IOHEXOL 300 MG/ML  SOLN COMPARISON:  04/10/2015 FINDINGS: Lower chest: Large bilateral pleural effusions with underlying atelectasis. Bilateral lower lobe consolidation right greater than left with air bronchograms. Patchy interstitial infiltrate right middle lobe. Hepatobiliary: Sub cm cyst right lobe stable. Sub cm calcified gallstone stable. Pancreas: Normal Spleen: No focal abnormalities Adrenals/Urinary Tract: Stable bilateral renal atrophy. Bladder is normal. Stomach/Bowel: Fecal retention throughout the ascending colon. Nonobstructive bowel gas pattern. Vascular/Lymphatic: Stable 4.5 cm infrarenal abdominal aortic  aneurysm with stent graft. Stable since then bypass. In the right inguinal region, there is a fluid-filled structure with thick enhancing wall. It measures 38 x 39 mm, not significantly different in size. There is discrete increase in the thickness and degree of enhancement involving the wall. In the left inguinal region there is a similar but smaller fluid collection. It measures about 39 x 29 mm, also slightly larger. It has also developed ran enhancement. Reproductive: Not visualized Other: Diffuse increased attenuation throughout the subcutaneous soft tissues worse when compared to the prior study consistent with anasarca. Musculoskeletal: No acute musculoskeletal findings. IMPRESSION: 1. Large pleural effusions with anasarca. Anasarca is worse when compared to the prior study. 2. Bilateral lower lobe consolidation is likely largely due to atelectasis, right greater than left. Additionally there is patchy infiltrate throughout the right middle and lower lobe consistent with clinical history of pneumonia. 3. Bilateral inguinal fluid extends both appear mildly larger when compared to prior study and have developed thick enhancing rims. Sterility of the collections on certain. Electronically Signed   By: Skipper Cliche M.D.   On: 05/21/2015 20:15     Medications:     . acetaminophen  1,000 mg Oral 3 times per day  . antiseptic oral rinse  7 mL Mouth Rinse BID  . aspirin EC  81 mg Oral Daily  . atorvastatin  40 mg Oral Daily  . docusate sodium  100 mg Oral BID  . [START ON 05/23/2015] famotidine  20 mg Oral Q48H  . fluticasone  2 spray Each Nare Daily  . levothyroxine  88 mcg Oral Daily  . metoprolol  25 mg Oral BID  . mometasone-formoterol  2 puff Inhalation BID  . multivitamin  1 tablet Oral Daily  . piperacillin-tazobactam (ZOSYN)  IV  3.375 g Intravenous Q12H  . sodium chloride  1 spray Each Nare 5 X Daily  . vancomycin  500 mg Intravenous Q T,Th,Sa-HD   acetaminophen **OR** acetaminophen,  albuterol, anticoagulant sodium citrate, bisacodyl, chlorpheniramine-HYDROcodone, guaiFENesin-dextromethorphan, HYDROcodone-acetaminophen, loperamide, magnesium hydroxide, morphine injection, ondansetron **OR** ondansetron (ZOFRAN) IV, ondansetron, promethazine, senna-docusate  Assessment/ Plan:  65 y.o. female with COPD, hypertension, hyperlipidemia, vaginal intraepithelial neoplasia, emergent AAA repair status post rupture on November 18, left to right femoral to femoral bypass , acute renal failure leading to permanent dialysis, significant 3 vessel disease,   1. End-stage renal disease. [place]. TTS. [facility] nephrology -   1.2 L removed - feels better - next treatment planned for tomorrow  2. SOB - Pulmonary edema contributing - improved today   3. Hypertension and peripheral edema:  - BP is better controlled - agree with holding anti-HTN meds - goal BP approx A999333 systolic to allow volume removal with HD    4. Anemia of chronic kidney disease:   - epo with dialysis   5. Secondary Hyperparathyroidism:   - monitor phos   6. 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   Pack cathter with citrate     LOS: 2 Ilay Capshaw  2/22/201711:30 AM

## 2015-05-22 NOTE — Clinical Documentation Improvement (Addendum)
[facility]ospitalists at [facility], [place], Nephrology  (Query response must be documented in the current medical record, not on the CDI BPA form.)  Query 1 of 2    (please scroll down) Please document if a condition below provides greater specificity regarding the patient's "Acute Respiratory Distress":   Possible Clinical Conditions:  - Acute Hypoxic Respiratory Failure, present on admission, improving  - Other condition  - Unable to clinically determine  Clinical Information/Indicators: "Per EMS on their arrival patient's oxygen saturation was low 70s. Patient presents to the emergency Department on CPAP with oxygen saturation of 86% obvious respiratory distress" documented by ED provider, Dr. Owens Shark 05/20/15 "She was found to be hypoxic with sats 72% on ABG. She is currently on BiPAP setting 100%" documented in the H&P by Dr. Posey Pronto 05/20/15 "Acute Respiratory Distress, improving" documented in the CCM consult by Dr. Shawna Orleans 05/20/15  Query 2 of 2 Please document if a condition below provides greater specificity regarding the patient's "hypotension - suspect sepsis related":  Possible Clinical Conditions:  - Septic shock  - Other condition  - Unable to clinically determine  Clinical Information/Indicators: "Hypotension - suspect sepsis related" documented in CCM consult, Dr. Alva Garnet 05/20/15    Please exercise your independent, professional judgment when responding. A specific answer is not anticipated or expected.   Thank You, Erling Conte  RN BSN CCDS [phone] Health Information Management [place]

## 2015-05-22 NOTE — Clinical Social Work Note (Signed)
Clinical Social Work Assessment  Patient Details  Name: [name] MRN: [id number] Date of Birth: Feb 15, 1951  Date of referral:  05/21/15               Reason for consult:  Facility Placement                Permission sought to share information with:  Family Supports Permission granted to share information::  Yes, Verbal Permission Granted  Name::     Husband, [name]   Housing/Transportation Living arrangements for the past 2 months:  [place], [place] of Information:  Patient, Spouse Patient Interpreter Needed:  None Criminal Activity/Legal Involvement Pertinent to Current Situation/Hospitalization:  No - Comment as needed Significant Relationships:  Spouse Lives with:  Facility Resident Do you feel safe going back to the place where you live?  Yes Need for family participation in patient care:  No (Coment)  Care giving concerns:  No care giving concerns identified.    Social Worker assessment / plan:  CSW met with pt to address consult. CSW introduced herself explained role of social work. CSW also explained process of returning to facility. Pt was admitted from [facility]. Pt is agreeable to returning to facility at discharge. CSW spoke with facility and pt is able to return to facility when medically stable. CSW will continue to follow.   Employment status:  Retired Forensic scientist:  Medicare PT Recommendations:  Not assessed at this time [place] / Referral to community resources:  Other (Comment Required) ([place])  Patient/Family's Response to care:  Pt was appreciative of CSW support.   Patient/Family's Understanding of and Emotional Response to Diagnosis, Current Treatment, and Prognosis:  Pt is aware that she needs to return to facility for continued.   Emotional Assessment Appearance:  Appears stated age Attitude/Demeanor/Rapport:  Other (Appropriate) Affect (typically observed):   Accepting, Pleasant Orientation:  Oriented to Situation, Oriented to  Time, Oriented to Place, Oriented to Self Alcohol / Substance use:  Never Used Psych involvement (Current and /or in the community):  No (Comment)  Discharge Needs  Concerns to be addressed:  Adjustment to Illness Readmission within the last 30 days:   Yes Current discharge risk:  Chronically ill Barriers to Discharge:  Continued Medical Work up   [employer], LCSW 05/22/2015, 1:40 PM

## 2015-05-22 NOTE — Care Management Important Message (Signed)
Important Message  Patient Details  Name: [name] MRN: [id number] Date of Birth: 08-15-1950   Medicare Important Message Given:  Yes    [name] A Alethea Terhaar 05/22/2015, 11:00 AM

## 2015-05-22 NOTE — Progress Notes (Signed)
[facility] [place] NAME: [name]    MR#:  [id number]  DATE OF BIRTH:  05/28/1950  SUBJECTIVE:  Feels little better continues to have shortness of breath. Had episode of tachycardia after HD y'day HR much improved today. Breathing better NPO for possible right groin procedure  REVIEW OF SYSTEMS:   Review of Systems  Constitutional: Negative for fever, chills and weight loss.  HENT: Negative for ear discharge, ear pain and nosebleeds.   Eyes: Negative for blurred vision, pain and discharge.  Respiratory: Negative for sputum production, shortness of breath, wheezing and stridor.   Cardiovascular: Negative for chest pain, palpitations, orthopnea and PND.  Gastrointestinal: Negative for nausea, vomiting, abdominal pain and diarrhea.  Genitourinary: Negative for urgency and frequency.  Musculoskeletal: Negative for back pain and joint pain.  Skin:       Right groin swelling and inflammation and discharge  Neurological: Negative for sensory change, speech change, focal weakness and weakness.  Psychiatric/Behavioral: Negative for depression and hallucinations. The patient is not nervous/anxious.    Tolerating Diet: Yes Tolerating PT: Pending  DRUG ALLERGIES:   Allergies  Allergen Reactions  . Asa [Aspirin]   . 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 Plavix [Clopidogrel]     VITALS:  Blood pressure 122/55, pulse 78, temperature 97.7 F (36.5 C), temperature source Oral, resp. rate 18, height 5\' 4"  (1.626 m), weight 62.9 kg (138 lb 10.7 oz), last menstrual period 03/30/1988, SpO2 97 %.  PHYSICAL EXAMINATION:   Physical Exam  GENERAL:  65 y.o.-year-old patient lying in the bed with no acute distress.  EYES: Pupils equal, round, reactive to light and accommodation. No scleral icterus. Extraocular muscles intact.  HEENT: Head atraumatic, normocephalic. Oropharynx and nasopharynx clear.  NECK:   Supple, no jugular venous distention. No thyroid enlargement, no tenderness.  LUNGS: Distant breath sounds bilaterally, no wheezing, rales, rhonchi. No use of accessory muscles of respiration.  CARDIOVASCULAR: S1, S2 normal. No murmurs, rubs, or gallops.  ABDOMEN: Soft, nontender, nondistended. Bowel sounds present. No organomegaly or mass.  EXTREMITIES: No cyanosis, clubbing or edema b/l.   Right groin swelling with serosanguineous discharge NEUROLOGIC: Cranial nerves II through XII are intact. No focal Motor or sensory deficits b/l.   PSYCHIATRIC:  patient is alert and oriented x 3.  SKIN: No obvious rash, lesion, or ulcer.   LABORATORY PANEL:  CBC  Recent Labs Lab 05/20/15 0758  WBC 15.1*  HGB 10.5*  HCT 31.7*  PLT 483*    Chemistries   Recent Labs Lab 05/20/15 0758  NA 137  K 3.0*  CL 97*  CO2 27  GLUCOSE 152*  BUN 29*  CREATININE 3.45*  CALCIUM 8.6*  AST 36  ALT 13*  ALKPHOS 124  BILITOT 0.9   Cardiac Enzymes  Recent Labs Lab 05/20/15 0758  TROPONINI 0.59*   RADIOLOGY:  Ct Abdomen Pelvis W Contrast  05/21/2015  CLINICAL DATA:  Fever of 102.3 degrees in shortness of breath, right lower lobe pneumonia and sepsis with hypoxia, right groin seroma status post incision and drainage with wound VAC with continued serosanguineous drainage, sepsis, diastolic congestive heart failure EXAM: CT ABDOMEN AND PELVIS WITH CONTRAST TECHNIQUE: Multidetector CT imaging of the abdomen and pelvis was performed using the standard protocol following bolus administration of intravenous contrast. CONTRAST:  108mL OMNIPAQUE IOHEXOL 300 MG/ML  SOLN COMPARISON:  04/10/2015 FINDINGS: Lower chest: Large bilateral pleural effusions with underlying atelectasis. Bilateral  lower lobe consolidation right greater than left with air bronchograms. Patchy interstitial infiltrate right middle lobe. Hepatobiliary: Sub cm cyst right lobe stable. Sub cm calcified gallstone stable. Pancreas: Normal Spleen: No  focal abnormalities Adrenals/Urinary Tract: Stable bilateral renal atrophy. Bladder is normal. Stomach/Bowel: Fecal retention throughout the ascending colon. Nonobstructive bowel gas pattern. Vascular/Lymphatic: Stable 4.5 cm infrarenal abdominal aortic aneurysm with stent graft. Stable since then bypass. In the right inguinal region, there is a fluid-filled structure with thick enhancing wall. It measures 38 x 39 mm, not significantly different in size. There is discrete increase in the thickness and degree of enhancement involving the wall. In the left inguinal region there is a similar but smaller fluid collection. It measures about 39 x 29 mm, also slightly larger. It has also developed ran enhancement. Reproductive: Not visualized Other: Diffuse increased attenuation throughout the subcutaneous soft tissues worse when compared to the prior study consistent with anasarca. Musculoskeletal: No acute musculoskeletal findings. IMPRESSION: 1. Large pleural effusions with anasarca. Anasarca is worse when compared to the prior study. 2. Bilateral lower lobe consolidation is likely largely due to atelectasis, right greater than left. Additionally there is patchy infiltrate throughout the right middle and lower lobe consistent with clinical history of pneumonia. 3. Bilateral inguinal fluid extends both appear mildly larger when compared to prior study and have developed thick enhancing rims. Sterility of the collections on certain. Electronically Signed   By: Skipper Cliche M.D.   On: 05/21/2015 20:15   Dg Chest Port 1 View  05/20/2015  CLINICAL DATA:  65 year old female with respiratory distress and lower extremity swelling. EXAM: PORTABLE CHEST 1 VIEW COMPARISON:  Radiograph dated 04/17/2015 FINDINGS: A dual lumen dialysis catheter is noted in stable positioning. There has been interval removal of the left IJ central line. Single portable view of the chest demonstrates emphysematous changes lungs. There are  bilateral hazy densities, right greater left. There has been interval improvement of the interstitial prominence, likely interval improvement in pulmonary edema. Small left pleural effusion may be present. The cardiac silhouette is within normal limits. No acute osseous pathology identified. IMPRESSION: Interval removal of the left IJ central venous line. Interval improvement of the previously mild pulmonary edema. Bibasilar hazy airspace densities, right appearing left. Electronically Signed   By: Anner Crete M.D.   On: 05/20/2015 08:30   ASSESSMENT AND PLAN:  Hera Dexheimer is a 65 y.o. female with a known history of chronic diastolic congestive heart failure, incisional disease on hemodialysis, history of HIT,, anemia of chronic disease, history of right groin seroma after vascular procedures status post wound VAC placement in January 2017 comes to the emergency room from [address] with fever of 102.3 shortness of breath. Patient was found to have sepsis with right lower lobe pneumonia. She was found to be hypoxic with sats 72% on ABG.  1. Sepsis secondary to bilateral  lobe pneumonia hospital-acquired (patient was admitted and discharge in January 2017) and severe right groin infection of seroma recurrent -Broad spectrum antibiotics with vancomycin and Zosyn - blood cultures negative -We will wean BiPAP as patient's sats remained stable -Pulmonary consultation appreciated. -Seen by Dr. Lucky Cowboy  from vascular surgery patient possibly will need exploration of right groin seroma. He will review the CT scan -wound culture growing GPC and GNR  2. Acute on chronic diastolic congestive heart failure -Elevated BNP with shortness of breath and pulmonary edema as noted on chest x-ray -Consider hemodialysis with ultrafiltration -Continue cardiac meds  3. Hypertension Continue amlodipine,  metoprolol, Avapro, hydralazine  4. History of HIT -Avoid antiplatelet agents -SCD and teds for DVT  prophylaxis  5. End-stage renal disease on hemodialysis -Nephrology consultation for in-house HD  6. Hyperlipidemia continue statins  7. Anemia of chronic disease  -Hemoglobin appear stable transfuse as needed   8. Bilateral Pleural effusions with anasarca noted on CT abdomen -?thoracentesis therap to improve lung volume. Will d/w pulmonary  Case discussed with Care Management/Social Worker. Management plans discussed with the patient, family and they are in agreement. D/w dr dew  CODE STATUS: Full  DVT Prophylaxis: SCD and teds  TOTAL TIME TAKING CARE OF THIS PATIENT: 30 minutes.  >50% time spent on counselling and coordination of care  D/C  DEPENDING ON CLINICAL CONDITION TO [facility]  Note: This dictation was prepared with Dragon dictation along with smaller phrase technology. Any transcriptional errors that result from this process are unintentional.  Calven Gilkes M.D on 05/22/2015 at 7:56 AM  Between 7am to 6pm - Pager - [phone]  After 6pm go to [email or web address] - password [email or web address] [facility]  Office  [phone]  CC: Primary care physician; Arnette Norris, MD

## 2015-05-22 NOTE — Progress Notes (Addendum)
Much improved. Sepsis resolving. No new complaints  Filed Vitals:   05/22/15 0230 05/22/15 0443 05/22/15 0951 05/22/15 1318  BP: 119/54 122/55 127/59 121/55  Pulse: 87 78 87 81  Temp:  97.7 F (36.5 C)  98.1 F (36.7 C)  TempSrc:  Oral  Oral  Resp:  18  18  Height:      Weight:      SpO2:  97%  99%    Gen: WDWN in NAD HEENT: All WNL Lungs: full BS, no adventitious sounds Cardiovascular: Reg rate, normal rhythm, no M noted Abdomen: Soft, NT +BS Ext: no C/C/E Neuro: CNs intact, motor/sens grossly intact Skin: No lesions noted  BMP Latest Ref Rng 05/20/2015 04/20/2015 04/17/2015  Glucose 65 - 99 mg/dL 152(H) 102(H) 105(H)  BUN 6 - 20 mg/dL 29(H) 35(H) 25(H)  Creatinine 0.44 - 1.00 mg/dL 3.45(H) 4.61(H) 3.37(H)  Sodium 135 - 145 mmol/L 137 136 136  Potassium 3.5 - 5.1 mmol/L 3.0(L) 3.7 3.6  Chloride 101 - 111 mmol/L 97(L) 97(L) 100(L)  CO2 22 - 32 mmol/L 27 30 28   Calcium 8.9 - 10.3 mg/dL 8.6(L) 8.2(L) 8.0(L)    CBC Latest Ref Rng 05/20/2015 04/22/2015 04/21/2015  WBC 3.6 - 11.0 K/uL 15.1(H) 9.9 6.4  Hemoglobin 12.0 - 16.0 g/dL 10.5(L) 9.7(L) 7.2(L)  Hematocrit 35.0 - 47.0 % 31.7(L) 28.0(L) 20.9(L)  Platelets 150 - 440 K/uL 483(H) 158 107(L)    CXR: No new film  CTAP noted: moderate bilateral pleural effusions, anasarca, compressive atx  IMPRESSION: Severe sepsis - likely due to surgical wound infection Doubt PNA Bilateral pleural effusions - likely due to volume overload, low oncotic pressure, sepsis-related capillary leak.  PLAN/REC: Continue current abx until wound culture results are available and coverage can be narrowed Further eval and mgmt of wound infection per vasc surgery No indication for thoracentesis @ present - these should resolve over time with resolution of sepsis and with dialysis  PCCM will sign off. Please call if we can be of further assistance   Merton Border, MD PCCM service Mobile [phone] Pager [phone]

## 2015-05-22 NOTE — Progress Notes (Addendum)
Initial appointment made at the [facility] on June 05, 2015 at 9:00am. Thank you.

## 2015-05-23 MED ORDER — CEFAZOLIN SODIUM 1-5 GM-% IV SOLN
1.0000 g | INTRAVENOUS | Status: AC
  Filled 2015-05-23 (×2): qty 50

## 2015-05-23 NOTE — Progress Notes (Signed)
Hemodialysis tx start 

## 2015-05-23 NOTE — Care Management Note (Signed)
Patient is active at [facility] on TTS schedule.   Patient is followed by [facility] nephrology at the center.  Clinic is aware of hospital admission.  I will send additional records to the center at discharge.  Iran Sizer  Dialysis Coordinator   [phone]

## 2015-05-23 NOTE — Progress Notes (Signed)
Subjective:  Patient seen during dialysis Tolerating well    HEMODIALYSIS FLOWSHEET:  Blood Flow Rate (mL/min): 400 mL/min Arterial Pressure (mmHg): -170 mmHg Venous Pressure (mmHg): 140 mmHg Transmembrane Pressure (mmHg): 50 mmHg Ultrafiltration Rate (mL/min): 860 mL/min Dialysate Flow Rate (mL/min): 800 ml/min Conductivity: Machine : 14.1 Conductivity: Machine : 14.1 Dialysis Fluid Bolus: Normal Saline Bolus Amount (mL): 250 mL Intra-Hemodialysis Comments: 3084ml.Marland KitchenMarland KitchenTx completed.     Objective:  Vital signs in last 24 hours:  Temp:  [98.1 F (36.7 C)-98.4 F (36.9 C)] 98.4 F (36.9 C) (02/23 1340) Pulse Rate:  [82-96] 96 (02/23 1340) Resp:  [15-20] 18 (02/23 1340) BP: (127-160)/(56-82) 140/58 mmHg (02/23 1340) SpO2:  [94 %-100 %] 98 % (02/23 1340) Weight:  [58.3 kg (128 lb 8.5 oz)] 58.3 kg (128 lb 8.5 oz) (02/23 0915)  Weight change:  Filed Weights   05/21/15 0237 05/21/15 1330 05/23/15 0915  Weight: 61.644 kg (135 lb 14.4 oz) 62.9 kg (138 lb 10.7 oz) 58.3 kg (128 lb 8.5 oz)    Intake/Output:    Intake/Output Summary (Last 24 hours) at 05/23/15 1353 Last data filed at 05/23/15 1308  Gross per 24 hour  Intake      0 ml  Output   2500 ml  Net  -2500 ml     Physical Exam: General: no acute distress, lying in the bed  HEENT Anicteric,    Neck supple  Pulm/lungs Clear b/l  CVS/Heart Irregular rhythm, tachycardic  Abdomen:  Soft, nontender  Extremities: 2+ dependent pitting edema over thighs  Neurologic: Alert, oriented  Skin:   Access: Right IJ PermCath       Basic Metabolic Panel:   Recent Labs Lab 05/20/15 0758  NA 137  K 3.0*  CL 97*  CO2 27  GLUCOSE 152*  BUN 29*  CREATININE 3.45*  CALCIUM 8.6*     CBC:  Recent Labs Lab 05/20/15 0758  WBC 15.1*  HGB 10.5*  HCT 31.7*  MCV 98.8  PLT 483*      Microbiology:  Recent Results (from the past 720 hour(s))  Wound culture     Status: None (Preliminary result)   Collection  Time: 05/20/15 12:25 PM  Result Value Ref Range Status   Specimen Description GROIN RIGHT  Final   Special Requests NONE  Final   Gram Stain   Final    FEW WBC SEEN FEW GRAM NEGATIVE RODS RARE GRAM POSITIVE COCCI    Culture   Final    RARE PSEUDOMONAS AERUGINOSA LIGHT GROWTH GRAM POSITIVE COCCI IDENTIFICATION AND SUSCEPTIBILITIES TO FOLLOW    Report Status PENDING  Incomplete   Organism ID, Bacteria PSEUDOMONAS AERUGINOSA  Final      Susceptibility   Pseudomonas aeruginosa - MIC*    CEFTAZIDIME 4 SENSITIVE Sensitive     CIPROFLOXACIN 1 SENSITIVE Sensitive     GENTAMICIN <=1 SENSITIVE Sensitive     IMIPENEM 1 SENSITIVE Sensitive     CEFEPIME 4 SENSITIVE Sensitive     * RARE PSEUDOMONAS AERUGINOSA  MRSA PCR Screening     Status: None   Collection Time: 05/20/15 12:30 PM  Result Value Ref Range Status   MRSA by PCR NEGATIVE NEGATIVE Final    Comment:        The GeneXpert MRSA Assay (FDA approved for NASAL specimens only), is one component of a comprehensive MRSA colonization surveillance program. It is not intended to diagnose MRSA infection nor to guide or monitor treatment for MRSA infections.     Coagulation  Studies: No results for input(s): LABPROT, INR in the last 72 hours.  Urinalysis: No results for input(s): COLORURINE, LABSPEC, PHURINE, GLUCOSEU, HGBUR, BILIRUBINUR, KETONESUR, PROTEINUR, UROBILINOGEN, NITRITE, LEUKOCYTESUR in the last 72 hours.  Invalid input(s): APPERANCEUR    Imaging: Ct Abdomen Pelvis W Contrast  05/21/2015  CLINICAL DATA:  Fever of 102.3 degrees in shortness of breath, right lower lobe pneumonia and sepsis with hypoxia, right groin seroma status post incision and drainage with wound VAC with continued serosanguineous drainage, sepsis, diastolic congestive heart failure EXAM: CT ABDOMEN AND PELVIS WITH CONTRAST TECHNIQUE: Multidetector CT imaging of the abdomen and pelvis was performed using the standard protocol following bolus  administration of intravenous contrast. CONTRAST:  72mL OMNIPAQUE IOHEXOL 300 MG/ML  SOLN COMPARISON:  04/10/2015 FINDINGS: Lower chest: Large bilateral pleural effusions with underlying atelectasis. Bilateral lower lobe consolidation right greater than left with air bronchograms. Patchy interstitial infiltrate right middle lobe. Hepatobiliary: Sub cm cyst right lobe stable. Sub cm calcified gallstone stable. Pancreas: Normal Spleen: No focal abnormalities Adrenals/Urinary Tract: Stable bilateral renal atrophy. Bladder is normal. Stomach/Bowel: Fecal retention throughout the ascending colon. Nonobstructive bowel gas pattern. Vascular/Lymphatic: Stable 4.5 cm infrarenal abdominal aortic aneurysm with stent graft. Stable since then bypass. In the right inguinal region, there is a fluid-filled structure with thick enhancing wall. It measures 38 x 39 mm, not significantly different in size. There is discrete increase in the thickness and degree of enhancement involving the wall. In the left inguinal region there is a similar but smaller fluid collection. It measures about 39 x 29 mm, also slightly larger. It has also developed ran enhancement. Reproductive: Not visualized Other: Diffuse increased attenuation throughout the subcutaneous soft tissues worse when compared to the prior study consistent with anasarca. Musculoskeletal: No acute musculoskeletal findings. IMPRESSION: 1. Large pleural effusions with anasarca. Anasarca is worse when compared to the prior study. 2. Bilateral lower lobe consolidation is likely largely due to atelectasis, right greater than left. Additionally there is patchy infiltrate throughout the right middle and lower lobe consistent with clinical history of pneumonia. 3. Bilateral inguinal fluid extends both appear mildly larger when compared to prior study and have developed thick enhancing rims. Sterility of the collections on certain. Electronically Signed   By: Skipper Cliche M.D.   On:  05/21/2015 20:15     Medications:     . acetaminophen  1,000 mg Oral 3 times per day  . antiseptic oral rinse  7 mL Mouth Rinse BID  . aspirin EC  81 mg Oral Daily  . atorvastatin  40 mg Oral Daily  . docusate sodium  100 mg Oral BID  . famotidine  20 mg Oral Q48H  . fluticasone  2 spray Each Nare Daily  . levothyroxine  88 mcg Oral Daily  . metoprolol  25 mg Oral BID  . mometasone-formoterol  2 puff Inhalation BID  . multivitamin  1 tablet Oral Daily  . piperacillin-tazobactam (ZOSYN)  IV  3.375 g Intravenous Q12H  . sodium chloride  1 spray Each Nare 5 X Daily  . vancomycin  500 mg Intravenous Q T,Th,Sa-HD   acetaminophen **OR** acetaminophen, albuterol, anticoagulant sodium citrate, bisacodyl, chlorpheniramine-HYDROcodone, guaiFENesin-dextromethorphan, HYDROcodone-acetaminophen, loperamide, magnesium hydroxide, morphine injection, ondansetron **OR** ondansetron (ZOFRAN) IV, ondansetron, promethazine, senna-docusate  Assessment/ Plan:  65 y.o. female with COPD, hypertension, hyperlipidemia, vaginal intraepithelial neoplasia, emergent AAA repair status post rupture on November 18, left to right femoral to femoral bypass , acute renal failure leading to permanent dialysis, significant 3 vessel disease,  1. End-stage renal disease. [name]. TTS. [name] nephrology -   Patient seen during dialysis Tolerating well   2. SOB - Pulmonary edema contributing - improved today   3. Hypertension and peripheral edema:  - BP is better controlled - agree with holding anti-HTN meds - goal BP approx [id number] systolic to allow volume removal with HD    4. Anemia of chronic kidney disease:   - epo with dialysis   5. Secondary Hyperparathyroidism:   - monitor phos   6. 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   Pack cathter with citrate     LOS: 3 [name] 2/23/20171:53 PM

## 2015-05-23 NOTE — Progress Notes (Signed)
Pre-hd tx 

## 2015-05-23 NOTE — Progress Notes (Signed)
Report called to [facility]

## 2015-05-23 NOTE — Plan of Care (Signed)
Problem: Pain Management: Goal: Expressions of feelings of enhanced comfort will increase Outcome: Progressing Pt had HD today, returns with no noted complaints, pt with no distress or discomfort noted, voices any needs

## 2015-05-23 NOTE — Progress Notes (Signed)
[facility] NAME: [name]    MR#:  [id number]  DATE OF BIRTH:  [birth year]  SUBJECTIVE:  Feels little better seen at HD HR much improved today. Breathing better  REVIEW OF SYSTEMS:   Review of Systems  Constitutional: Negative for fever, chills and weight loss.  HENT: Negative for ear discharge, ear pain and nosebleeds.   Eyes: Negative for blurred vision, pain and discharge.  Respiratory: Negative for sputum production, shortness of breath, wheezing and stridor.   Cardiovascular: Negative for chest pain, palpitations, orthopnea and PND.  Gastrointestinal: Negative for nausea, vomiting, abdominal pain and diarrhea.  Genitourinary: Negative for urgency and frequency.  Musculoskeletal: Negative for back pain and joint pain.  Skin:       Right groin swelling and inflammation and discharge  Neurological: Negative for sensory change, speech change, focal weakness and weakness.  Psychiatric/Behavioral: Negative for depression and hallucinations. The patient is not nervous/anxious.    Tolerating Diet: Yes Tolerating PT: Pending  DRUG ALLERGIES:   Allergies  Allergen Reactions  . Asa [Aspirin]   . 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 Plavix [Clopidogrel]     VITALS:  Blood pressure 140/58, pulse 96, temperature 98.4 F (36.9 C), temperature source Oral, resp. rate 18, height 5\' 4"  (1.626 m), weight 58.3 kg (128 lb 8.5 oz), last menstrual period 03/30/1988, SpO2 98 %.  PHYSICAL EXAMINATION:   Physical Exam  GENERAL:  65 y.o.-year-old patient lying in the bed with no acute distress.  EYES: Pupils equal, round, reactive to light and accommodation. No scleral icterus. Extraocular muscles intact.  HEENT: Head atraumatic, normocephalic. Oropharynx and nasopharynx clear.  NECK:  Supple, no jugular venous distention. No thyroid enlargement, no tenderness.  LUNGS: Distant breath sounds  bilaterally, no wheezing, rales, rhonchi. No use of accessory muscles of respiration.  CARDIOVASCULAR: S1, S2 normal. No murmurs, rubs, or gallops.  ABDOMEN: Soft, nontender, nondistended. Bowel sounds present. No organomegaly or mass.  EXTREMITIES: No cyanosis, clubbing or edema b/l.   Right groin swelling with serosanguineous discharge NEUROLOGIC: Cranial nerves II through XII are intact. No focal Motor or sensory deficits b/l.   PSYCHIATRIC:  patient is alert and oriented x 3.  SKIN: No obvious rash, lesion, or ulcer.   LABORATORY PANEL:  CBC  Recent Labs Lab 05/20/15 0758  WBC 15.1*  HGB 10.5*  HCT 31.7*  PLT 483*    Chemistries   Recent Labs Lab 05/20/15 0758  NA 137  K 3.0*  CL 97*  CO2 27  GLUCOSE 152*  BUN 29*  CREATININE 3.45*  CALCIUM 8.6*  AST 36  ALT 13*  ALKPHOS 124  BILITOT 0.9   Cardiac Enzymes  Recent Labs Lab 05/20/15 0758  TROPONINI 0.59*   RADIOLOGY:  Ct Abdomen Pelvis W Contrast  05/21/2015  CLINICAL DATA:  Fever of 102.3 degrees in shortness of breath, right lower lobe pneumonia and sepsis with hypoxia, right groin seroma status post incision and drainage with wound VAC with continued serosanguineous drainage, sepsis, diastolic congestive heart failure EXAM: CT ABDOMEN AND PELVIS WITH CONTRAST TECHNIQUE: Multidetector CT imaging of the abdomen and pelvis was performed using the standard protocol following bolus administration of intravenous contrast. CONTRAST:  71mL OMNIPAQUE IOHEXOL 300 MG/ML  SOLN COMPARISON:  04/10/2015 FINDINGS: Lower chest: Large bilateral pleural effusions with underlying atelectasis. Bilateral lower lobe consolidation right greater than left with air bronchograms. Patchy interstitial infiltrate right middle lobe.  Hepatobiliary: Sub cm cyst right lobe stable. Sub cm calcified gallstone stable. Pancreas: Normal Spleen: No focal abnormalities Adrenals/Urinary Tract: Stable bilateral renal atrophy. Bladder is normal.  Stomach/Bowel: Fecal retention throughout the ascending colon. Nonobstructive bowel gas pattern. Vascular/Lymphatic: Stable 4.5 cm infrarenal abdominal aortic aneurysm with stent graft. Stable since then bypass. In the right inguinal region, there is a fluid-filled structure with thick enhancing wall. It measures 38 x 39 mm, not significantly different in size. There is discrete increase in the thickness and degree of enhancement involving the wall. In the left inguinal region there is a similar but smaller fluid collection. It measures about 39 x 29 mm, also slightly larger. It has also developed ran enhancement. Reproductive: Not visualized Other: Diffuse increased attenuation throughout the subcutaneous soft tissues worse when compared to the prior study consistent with anasarca. Musculoskeletal: No acute musculoskeletal findings. IMPRESSION: 1. Large pleural effusions with anasarca. Anasarca is worse when compared to the prior study. 2. Bilateral lower lobe consolidation is likely largely due to atelectasis, right greater than left. Additionally there is patchy infiltrate throughout the right middle and lower lobe consistent with clinical history of pneumonia. 3. Bilateral inguinal fluid extends both appear mildly larger when compared to prior study and have developed thick enhancing rims. Sterility of the collections on certain. Electronically Signed   By: Skipper Cliche M.D.   On: 05/21/2015 20:15   ASSESSMENT AND PLAN:  [name] is a 65 y.o. female with a known history of chronic diastolic congestive heart failure, incisional disease on hemodialysis, history of HIT,, anemia of chronic disease, history of right groin seroma after vascular procedures status post wound VAC placement in January 2017 comes to the emergency room from [facility] with fever of 102.3 shortness of breath. Patient was found to have sepsis with right lower lobe pneumonia. She was found to be hypoxic with sats 72% on  ABG.  1. Sepsis secondary to bilateral  lobe pneumonia hospital-acquired (patient was admitted and discharge in January 2017) and severe right groin infection of seroma recurrent -Broad spectrum antibiotics with vancomycin and Zosyn - blood cultures negative -We will wean BiPAP as patient's sats remained stable -Pulmonary consultation appreciated. -Seen by Dr. Lucky Cowboy  from vascular surgery patient possibly will need exploration of right groin seroma. He will review the CT scan -wound culture growing Pseudomonas -await ID consult regarding right groin infection  2. Acute on chronic diastolic congestive heart failure -Elevated BNP with shortness of breath and pulmonary edema as noted on chest x-ray -Consider hemodialysis with ultrafiltration -Continue cardiac meds  3. Hypertension Continue amlodipine, metoprolol, Avapro, hydralazine  4. History of HIT -Avoid antiplatelet agents -SCD and teds for DVT prophylaxis  5. End-stage renal disease on hemodialysis -Nephrology consultation for in-house HD  6. Hyperlipidemia continue statins  7. Anemia of chronic disease  -Hemoglobin appear stable transfuse as needed   8. Bilateral Pleural effusions with anasarca noted on CT abdomen -?thoracentesis therap to improve lung volume. Will d/w pulmonary  Case discussed with Care Management/Social Worker. Management plans discussed with the patient, family and they are in agreement. D/w dr dew  CODE STATUS: Full  DVT Prophylaxis: SCD and teds  TOTAL TIME TAKING CARE OF THIS PATIENT: 30 minutes.  >50% time spent on counselling and coordination of care  D/C  DEPENDING ON CLINICAL CONDITION TO [facility]  Note: This dictation was prepared with Dragon dictation along with smaller phrase technology. Any transcriptional errors that result from this process are unintentional.  Benford Asch M.D  on 05/23/2015 at 4:09 PM  Between 7am to 6pm - Pager - [phone]  After 6pm go to [email or web address] - password  [email or web address]  Office  [phone]  CC: Primary care physician; Arnette Norris, MD

## 2015-05-23 NOTE — Progress Notes (Signed)
Hemodialysis completed. 

## 2015-05-24 ENCOUNTER — Inpatient Hospital Stay: Payer: Medicare Other | Admitting: Anesthesiology

## 2015-05-24 ENCOUNTER — Encounter
Admission: EM | Disposition: A | Discharge status: Skilled Nursing Facility | Payer: Self-pay | Source: Home / Self Care | Attending: Internal Medicine

## 2015-05-24 ENCOUNTER — Encounter: Payer: Self-pay | Admitting: Anesthesiology

## 2015-05-24 HISTORY — PX: GROIN DEBRIDEMENT: SHX5159

## 2015-05-24 LAB — CBC
HEMATOCRIT: 21.8 % — AB (ref 35.0–47.0)
Hemoglobin: 7.4 g/dL — ABNORMAL LOW (ref 12.0–16.0)
MCH: 33.3 pg (ref 26.0–34.0)
MCHC: 34 g/dL (ref 32.0–36.0)
MCV: 98 fL (ref 80.0–100.0)
Platelets: 204 10*3/uL (ref 150–440)
RBC: 2.23 MIL/uL — AB (ref 3.80–5.20)
RDW: 14.8 % — AB (ref 11.5–14.5)
WBC: 6.2 10*3/uL (ref 3.6–11.0)

## 2015-05-24 LAB — BASIC METABOLIC PANEL
ANION GAP: 8 (ref 5–15)
BUN: 14 mg/dL (ref 6–20)
CALCIUM: 7.9 mg/dL — AB (ref 8.9–10.3)
CO2: 32 mmol/L (ref 22–32)
Chloride: 99 mmol/L — ABNORMAL LOW (ref 101–111)
Creatinine, Ser: 2.41 mg/dL — ABNORMAL HIGH (ref 0.44–1.00)
GFR, EST AFRICAN AMERICAN: 23 mL/min — AB (ref >60)
GFR, EST NON AFRICAN AMERICAN: 20 mL/min — AB (ref >60)
GLUCOSE: 94 mg/dL (ref 65–99)
POTASSIUM: 3.1 mmol/L — AB (ref 3.5–5.1)
Sodium: 139 mmol/L (ref 135–145)

## 2015-05-24 LAB — POTASSIUM: POTASSIUM: 3.8 mmol/L (ref 3.5–5.1)

## 2015-05-24 LAB — APTT: APTT: 43 s — AB (ref 24–36)

## 2015-05-24 LAB — TYPE AND SCREEN
ABO/RH(D): B POS
Antibody Screen: NEGATIVE

## 2015-05-24 LAB — PROTIME-INR
INR: 1.18
Prothrombin Time: 15.2 seconds — ABNORMAL HIGH (ref 11.4–15.0)

## 2015-05-24 SURGERY — DEBRIDEMENT, INGUINAL REGION
Anesthesia: General | Site: Groin | Laterality: Right | Wound class: Dirty or Infected

## 2015-05-24 MED ORDER — LACTATED RINGERS IV SOLN
INTRAVENOUS | Status: DC | PRN
  Administered 2015-05-24: 20:00:00 via INTRAVENOUS

## 2015-05-24 MED ORDER — SODIUM CHLORIDE 0.9 % IV SOLN
500.0000 mg | INTRAVENOUS | Status: DC
  Filled 2015-05-24: qty 500

## 2015-05-24 MED ORDER — FENTANYL CITRATE (PF) 100 MCG/2ML IJ SOLN
INTRAMUSCULAR | Status: DC | PRN
  Administered 2015-05-24 (×2): 25 ug via INTRAVENOUS
  Administered 2015-05-24: 50 ug via INTRAVENOUS

## 2015-05-24 MED ORDER — POTASSIUM CHLORIDE CRYS ER 20 MEQ PO TBCR
40.0000 meq | EXTENDED_RELEASE_TABLET | Freq: Once | ORAL | Status: DC
  Filled 2015-05-24: qty 2

## 2015-05-24 MED ORDER — PROPOFOL 10 MG/ML IV BOLUS
INTRAVENOUS | Status: DC | PRN
  Administered 2015-05-24: 60 mg via INTRAVENOUS

## 2015-05-24 MED ORDER — LIDOCAINE HCL 1 % IJ SOLN
INTRAMUSCULAR | Status: DC | PRN
  Administered 2015-05-24: 15 mL via INTRAMUSCULAR

## 2015-05-24 MED ORDER — BUPIVACAINE-EPINEPHRINE (PF) 0.5% -1:200000 IJ SOLN
INTRAMUSCULAR | Status: AC
  Filled 2015-05-24: qty 30

## 2015-05-24 MED ORDER — LIDOCAINE HCL (CARDIAC) 20 MG/ML IV SOLN
INTRAVENOUS | Status: DC | PRN
  Administered 2015-05-24: 80 mg via INTRAVENOUS

## 2015-05-24 MED ORDER — CEFAZOLIN SODIUM 1 G IJ SOLR
INTRAMUSCULAR | Status: DC | PRN
  Administered 2015-05-24: 1 g via INTRAMUSCULAR

## 2015-05-24 MED ORDER — FENTANYL CITRATE (PF) 100 MCG/2ML IJ SOLN: 25.0000 ug | INTRAMUSCULAR | Status: DC | PRN

## 2015-05-24 MED ORDER — ONDANSETRON HCL 4 MG/2ML IJ SOLN: 4.0000 mg | Freq: Once | INTRAMUSCULAR | Status: DC | PRN

## 2015-05-24 SURGICAL SUPPLY — 27 items
CANISTER SUCT 1200ML W/VALVE (MISCELLANEOUS) ×3 IMPLANT
CHLORAPREP W/TINT 26ML (MISCELLANEOUS) ×1 IMPLANT
DRAPE LAPAROTOMY 100X77 ABD (DRAPES) ×3 IMPLANT
ELECT REM PT RETURN 9FT ADLT (ELECTROSURGICAL) ×3
ELECTRODE REM PT RTRN 9FT ADLT (ELECTROSURGICAL) ×1 IMPLANT
GAUZE SPONGE 4X4 12PLY STRL (GAUZE/BANDAGES/DRESSINGS) ×1 IMPLANT
GLOVE SURG SYN 8.0 (GLOVE) ×12 IMPLANT
GLOVE SURG SYN 8.0 PF PI (GLOVE) ×1 IMPLANT
GOWN STRL REUS W/ TWL LRG LVL3 (GOWN DISPOSABLE) ×1 IMPLANT
GOWN STRL REUS W/ TWL XL LVL3 (GOWN DISPOSABLE) ×1 IMPLANT
GOWN STRL REUS W/TWL LRG LVL3 (GOWN DISPOSABLE)
GOWN STRL REUS W/TWL XL LVL3 (GOWN DISPOSABLE) ×9
KIT DRSG VAC SLVR GRANUFM (MISCELLANEOUS) ×2 IMPLANT
LABEL OR SOLS (LABEL) ×1 IMPLANT
NDL HYPO 25X1 1.5 SAFETY (NEEDLE) IMPLANT
NEEDLE HYPO 25X1 1.5 SAFETY (NEEDLE) ×3 IMPLANT
NS IRRIG 500ML POUR BTL (IV SOLUTION) ×3 IMPLANT
PACK BASIN MINOR ARMC (MISCELLANEOUS) ×3 IMPLANT
SUT VIC AB 2-0 SH 27 (SUTURE)
SUT VIC AB 2-0 SH 27XBRD (SUTURE) ×1 IMPLANT
SUT VIC AB 3-0 SH 27 (SUTURE)
SUT VIC AB 3-0 SH 27X BRD (SUTURE) ×1 IMPLANT
SWAB CULTURE AMIES ANAERIB BLU (MISCELLANEOUS) ×1 IMPLANT
SYR BULB EAR ULCER 3OZ GRN STR (SYRINGE) ×2 IMPLANT
SYRINGE 10CC LL (SYRINGE) ×2 IMPLANT
TRAP MUCOUS 40ML (MISCELLANEOUS) ×2 IMPLANT
WND VAC CANISTER 500ML (MISCELLANEOUS) ×2 IMPLANT

## 2015-05-24 NOTE — Consult Note (Signed)
MEDICATION RELATED CONSULT NOTE - INITIAL   Pharmacy Consult for Potassium Management Indication: Hypokalemia  Allergies  Allergen Reactions  . Asa [Aspirin]   . 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 Plavix [Clopidogrel]     Patient Measurements: Height: 5\' 4"  (162.6 cm) Weight: 128 lb 8.5 oz (58.3 kg) IBW/kg (Calculated) : 54.7  Vital Signs: Temp: 98.3 F (36.8 C) (02/24 1301) Temp Source: Oral (02/24 1301) BP: 154/64 mmHg (02/24 1301) Pulse Rate: 91 (02/24 1301) Intake/Output from previous day: 02/23 0701 - 02/24 0700 In: 101 [P.O.:60; IV Piggyback:41] Out: 2500  Intake/Output from this shift:    Labs:  Recent Labs  05/24/15 0659  WBC 6.2  HGB 7.4*  HCT 21.8*  PLT 204  APTT 43*  CREATININE 2.41*   Estimated Creatinine Clearance: 19.8 mL/min (by C-G formula based on Cr of 2.41).   Microbiology: Recent Results (from the past 720 hour(s))  Wound culture     Status: None (Preliminary result)   Collection Time: 05/20/15 12:25 PM  Result Value Ref Range Status   Specimen Description GROIN RIGHT  Final   Special Requests NONE  Final   Gram Stain   Final    FEW WBC SEEN FEW GRAM NEGATIVE RODS RARE GRAM POSITIVE COCCI    Culture   Final    RARE PSEUDOMONAS AERUGINOSA LIGHT GROWTH GRAM POSITIVE COCCI IDENTIFICATION AND SUSCEPTIBILITIES TO FOLLOW    Report Status PENDING  Incomplete   Organism ID, Bacteria PSEUDOMONAS AERUGINOSA  Final      Susceptibility   Pseudomonas aeruginosa - MIC*    CEFTAZIDIME 4 SENSITIVE Sensitive     CIPROFLOXACIN 1 SENSITIVE Sensitive     GENTAMICIN <=1 SENSITIVE Sensitive     IMIPENEM 1 SENSITIVE Sensitive     CEFEPIME 4 SENSITIVE Sensitive     * RARE PSEUDOMONAS AERUGINOSA  MRSA PCR Screening     Status: None   Collection Time: 05/20/15 12:30 PM  Result Value Ref Range Status   MRSA by PCR NEGATIVE NEGATIVE Final    Comment:        The GeneXpert MRSA Assay (FDA approved for NASAL specimens only),  is one component of a comprehensive MRSA colonization surveillance program. It is not intended to diagnose MRSA infection nor to guide or monitor treatment for MRSA infections.     Medical History: Past Medical History  Diagnosis Date  . Hyperlipidemia   . Hypertension   . Thyroid disease   . GERD (gastroesophageal reflux disease)   . Aneurysm ([place])     abdominal aortic  . STD (sexually transmitted disease)     chlamydia  . VAIN II (vaginal intraepithelial neoplasia grade II) 7/09  . 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 B12 deficiency   . COPD (chronic obstructive pulmonary disease) ([place])   . Renal insufficiency   . Cancer ([place])     skin on left ankle 04/10/15 pt states she has never had cancer    Medications:  Scheduled:  . acetaminophen  1,000 mg Oral 3 times per day  . antiseptic oral rinse  7 mL Mouth Rinse BID  . aspirin EC  81 mg Oral Daily  . atorvastatin  40 mg Oral Daily  .  ceFAZolin (ANCEF) IV  1 g Intravenous On Call  . docusate sodium  100 mg Oral BID  . famotidine  20 mg Oral Q48H  . fluticasone  2 spray Each Nare Daily  .  levothyroxine  88 mcg Oral Daily  . metoprolol  25 mg Oral BID  . mometasone-formoterol  2 puff Inhalation BID  . multivitamin  1 tablet Oral Daily  . piperacillin-tazobactam (ZOSYN)  IV  3.375 g Intravenous Q12H  . potassium chloride  40 mEq Oral Once  . sodium chloride  1 spray Each Nare 5 X Daily  . [START ON 05/25/2015] vancomycin (VANCOCIN) 500 mg IVPB (Hemodialysis)  500 mg Intravenous Q T,Th,Sa-HD   Infusions:    Assessment: PE is a 65 yo female presenting with hypokalemia. Pharmacy consulted to manage potassium in this patient.   Goal of Therapy:  K between 3.5-5  Plan:  Initiate KCl 8mEq PO x1 and recheck K at 1800.  [name] 05/24/2015,2:53 PM

## 2015-05-24 NOTE — Transfer of Care (Signed)
Immediate Anesthesia Transfer of Care Note  Patient: [name]  Procedure(s) Performed: Procedure(s): GROIN DEBRIDEMENT (Right)  Patient Location: PACU  Anesthesia Type:General  Level of Consciousness: sedated  Airway & Oxygen Therapy: Patient Spontanous Breathing and Patient connected to face mask oxygen  Post-op Assessment: Report given to RN and Post -op Vital signs reviewed and stable  Post vital signs: Reviewed and stable  Last Vitals:  Filed Vitals:   05/24/15 2102 05/24/15 2103  BP:  142/71  Pulse: 92 89  Temp: 36.1 C   Resp:  12    Complications: No apparent anesthesia complications

## 2015-05-24 NOTE — Progress Notes (Signed)
MEDICATION RELATED CONSULT NOTE - INITIAL   Pharmacy Consult for Potassium  Indication: Potassium replacement.    Allergies  Allergen Reactions  . Asa [Aspirin]   . 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 Plavix [Clopidogrel]     Patient Measurements: Height: 5\' 4"  (162.6 cm) Weight: 128 lb 8.5 oz (58.3 kg) IBW/kg (Calculated) : 54.7 Adjusted Body Weight:   Vital Signs: Temp: 96.9 F (36.1 C) (02/24 2102) Temp Source: Oral (02/24 1301) BP: 149/70 mmHg (02/24 2125) Pulse Rate: 92 (02/24 2130) Intake/Output from previous day: 02/23 0701 - 02/24 0700 In: 101 [P.O.:60; IV Piggyback:41] Out: 2500  Intake/Output from this shift: Total I/O In: 300 [I.V.:300] Out: 10 [Blood:10]  Labs:  Recent Labs  05/24/15 0659  WBC 6.2  HGB 7.4*  HCT 21.8*  PLT 204  APTT 43*  CREATININE 2.41*   Estimated Creatinine Clearance: 19.8 mL/min (by C-G formula based on Cr of 2.41).   Microbiology: Recent Results (from the past 720 hour(s))  Wound culture     Status: None (Preliminary result)   Collection Time: 05/20/15 12:25 PM  Result Value Ref Range Status   Specimen Description GROIN RIGHT  Final   Special Requests NONE  Final   Gram Stain   Final    FEW WBC SEEN FEW GRAM NEGATIVE RODS RARE GRAM POSITIVE COCCI    Culture   Final    RARE PSEUDOMONAS AERUGINOSA LIGHT GROWTH GRAM POSITIVE COCCI IDENTIFICATION AND SUSCEPTIBILITIES TO FOLLOW    Report Status PENDING  Incomplete   Organism ID, Bacteria PSEUDOMONAS AERUGINOSA  Final      Susceptibility   Pseudomonas aeruginosa - MIC*    CEFTAZIDIME 4 SENSITIVE Sensitive     CIPROFLOXACIN 1 SENSITIVE Sensitive     GENTAMICIN <=1 SENSITIVE Sensitive     IMIPENEM 1 SENSITIVE Sensitive     CEFEPIME 4 SENSITIVE Sensitive     * RARE PSEUDOMONAS AERUGINOSA  MRSA PCR Screening     Status: None   Collection Time: 05/20/15 12:30 PM  Result Value Ref Range Status   MRSA by PCR NEGATIVE NEGATIVE Final    Comment:         The GeneXpert MRSA Assay (FDA approved for NASAL specimens only), is one component of a comprehensive MRSA colonization surveillance program. It is not intended to diagnose MRSA infection nor to guide or monitor treatment for MRSA infections.     Medical History: Past Medical History  Diagnosis Date  . Hyperlipidemia   . Hypertension   . Thyroid disease   . GERD (gastroesophageal reflux disease)   . Aneurysm ([place])     abdominal aortic  . STD (sexually transmitted disease)     chlamydia  . VAIN II (vaginal intraepithelial neoplasia grade II) 7/09  . 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 B12 deficiency   . COPD (chronic obstructive pulmonary disease) ([place])   . Renal insufficiency   . Cancer ([place])     skin on left ankle 04/10/15 pt states she has never had cancer    Medications:  Prescriptions prior to admission  Medication Sig Dispense Refill Last Dose  . acetaminophen (TYLENOL) 500 MG tablet Take 1,000 mg by mouth every 8 (eight) hours.   unknown at unknown  . albuterol (PROVENTIL) (2.5 MG/3ML) 0.083% nebulizer solution Take 2.5 mg by nebulization every 6 (six) hours as needed for wheezing.   prn at prn  . amLODipine (NORVASC) 5 MG tablet Take  1 tablet (5 mg total) by mouth daily. 30 tablet 6 unknown at unknown  . aspirin EC 81 MG tablet Take 81 mg by mouth daily.   unknown at unknown  . atorvastatin (LIPITOR) 40 MG tablet Take 40 mg by mouth daily.   unknown at unknown  . cefTRIAXone (ROCEPHIN) 1-3.74 GM-% IVPB Inject 1 g into the vein daily.   unknown at unknown  . chlorpheniramine-HYDROcodone (TUSSIONEX) 10-8 MG/5ML SUER Take 5 mLs by mouth every 12 (twelve) hours as needed for cough. 140 mL 0 prn at prn  . docusate sodium (COLACE) 100 MG capsule Take 100 mg by mouth 2 (two) times daily.    unknown at unknown  . doxycycline (VIBRA-TABS) 100 MG tablet Take 100 mg by mouth 2 (two) times daily. For 14 days   unknown at unknown  . famotidine (PEPCID)  20 MG tablet Take 20 mg by mouth daily.   unknown at unknown  . fluticasone (FLONASE) 50 MCG/ACT nasal spray Place 2 sprays into both nostrils daily.   unknown at unknown  . fluticasone furoate-vilanterol (BREO ELLIPTA) 100-25 MCG/INH AEPB Inhale 1 puff into the lungs daily.   unknown at unknown  . guaiFENesin-dextromethorphan (ROBITUSSIN DM) 100-10 MG/5ML syrup Take 10 mLs by mouth every 4 (four) hours as needed for cough. 118 mL 0 prn at prn  . hydrALAZINE (APRESOLINE) 10 MG tablet Take 10 mg by mouth every 6 (six) hours.   unknown at unknown  . irbesartan (AVAPRO) 75 MG tablet Take 1 tablet (75 mg total) by mouth 2 (two) times daily. 30 tablet 5 unknown at unknown  . levothyroxine (SYNTHROID, LEVOTHROID) 88 MCG tablet Take 88 mcg by mouth daily.   unknown at unknown  . loperamide (IMODIUM) 2 MG capsule Take 2 mg by mouth every 4 (four) hours as needed for diarrhea or loose stools.   prn at prn  . magnesium hydroxide (MILK OF MAGNESIA) 400 MG/5ML suspension Take 30 mLs by mouth daily as needed for mild constipation.   prn at prn  . metoprolol (LOPRESSOR) 50 MG tablet Take 1 tablet (50 mg total) by mouth every 8 (eight) hours. 90 tablet 5 unknown at unknown  . multivitamin (RENA-VIT) TABS tablet Take 1 tablet by mouth daily.   unknown at unknown  . ondansetron (ZOFRAN) 4 MG tablet Take 4 mg by mouth every 6 (six) hours as needed for nausea or vomiting.   prn at prn  . promethazine (PHENERGAN) 25 MG/ML injection Inject 25 mg into the vein every 6 (six) hours as needed for nausea or vomiting.   prn at prn  . sodium chloride (OCEAN) 0.65 % SOLN nasal spray Place 1 spray into both nostrils 5 (five) times daily.   unknown at unknown    Assessment: 2/24 :  K @ 18:39 = 3.8   Goal of Therapy:  K :  3.5 - 5.1   Plan:  No further K supplementation needed.  Will recheck K on 2/25 with AM labs.   Eilan Mcinerny D 05/24/2015,9:33 PM

## 2015-05-24 NOTE — Progress Notes (Signed)
Subjective:  Doing well today breathing is better 2500 cc removed with HD yesterday    Objective:  Vital signs in last 24 hours:  Temp:  [98.1 F (36.7 C)-98.9 F (37.2 C)] 98.3 F (36.8 C) (02/24 1301) Pulse Rate:  [83-91] 91 (02/24 1301) Resp:  [16] 16 (02/24 1301) BP: (116-154)/(46-64) 154/64 mmHg (02/24 1301) SpO2:  [96 %-98 %] 96 % (02/24 1301)  Weight change:  Filed Weights   05/21/15 0237 05/21/15 1330 05/23/15 0915  Weight: 61.644 kg (135 lb 14.4 oz) 62.9 kg (138 lb 10.7 oz) 58.3 kg (128 lb 8.5 oz)    Intake/Output:    Intake/Output Summary (Last 24 hours) at 05/24/15 1525 Last data filed at 05/24/15 1301  Gross per 24 hour  Intake    101 ml  Output      0 ml  Net    101 ml     Physical Exam: General: no acute distress, lying in the bed  HEENT Anicteric,    Neck supple  Pulm/lungs Clear b/l  CVS/Heart Irregular rhythm, tachycardic  Abdomen:  Soft, nontender  Extremities: Peripheral edema improved to trace  Neurologic: Alert, oriented  Skin:   Access: Right IJ PermCath       Basic Metabolic Panel:   Recent Labs Lab 05/20/15 0758 05/24/15 0659  NA 137 139  K 3.0* 3.1*  CL 97* 99*  CO2 27 32  GLUCOSE 152* 94  BUN 29* 14  CREATININE 3.45* 2.41*  CALCIUM 8.6* 7.9*     CBC:  Recent Labs Lab 05/20/15 0758 05/24/15 0659  WBC 15.1* 6.2  HGB 10.5* 7.4*  HCT 31.7* 21.8*  MCV 98.8 98.0  PLT 483* 204      Microbiology:  Recent Results (from the past 720 hour(s))  Wound culture     Status: None (Preliminary result)   Collection Time: 05/20/15 12:25 PM  Result Value Ref Range Status   Specimen Description GROIN RIGHT  Final   Special Requests NONE  Final   Gram Stain   Final    FEW WBC SEEN FEW GRAM NEGATIVE RODS RARE GRAM POSITIVE COCCI    Culture   Final    RARE PSEUDOMONAS AERUGINOSA LIGHT GROWTH GRAM POSITIVE COCCI IDENTIFICATION AND SUSCEPTIBILITIES TO FOLLOW    Report Status PENDING  Incomplete   Organism ID,  Bacteria PSEUDOMONAS AERUGINOSA  Final      Susceptibility   Pseudomonas aeruginosa - MIC*    CEFTAZIDIME 4 SENSITIVE Sensitive     CIPROFLOXACIN 1 SENSITIVE Sensitive     GENTAMICIN <=1 SENSITIVE Sensitive     IMIPENEM 1 SENSITIVE Sensitive     CEFEPIME 4 SENSITIVE Sensitive     * RARE PSEUDOMONAS AERUGINOSA  MRSA PCR Screening     Status: None   Collection Time: 05/20/15 12:30 PM  Result Value Ref Range Status   MRSA by PCR NEGATIVE NEGATIVE Final    Comment:        The GeneXpert MRSA Assay (FDA approved for NASAL specimens only), is one component of a comprehensive MRSA colonization surveillance program. It is not intended to diagnose MRSA infection nor to guide or monitor treatment for MRSA infections.     Coagulation Studies:  Recent Labs  05/24/15 0659  LABPROT 15.2*  INR 1.18    Urinalysis: No results for input(s): COLORURINE, LABSPEC, PHURINE, GLUCOSEU, HGBUR, BILIRUBINUR, KETONESUR, PROTEINUR, UROBILINOGEN, NITRITE, LEUKOCYTESUR in the last 72 hours.  Invalid input(s): APPERANCEUR    Imaging: No results found.   Medications:     .  acetaminophen  1,000 mg Oral 3 times per day  . antiseptic oral rinse  7 mL Mouth Rinse BID  . aspirin EC  81 mg Oral Daily  . atorvastatin  40 mg Oral Daily  .  ceFAZolin (ANCEF) IV  1 g Intravenous On Call  . docusate sodium  100 mg Oral BID  . famotidine  20 mg Oral Q48H  . fluticasone  2 spray Each Nare Daily  . levothyroxine  88 mcg Oral Daily  . metoprolol  25 mg Oral BID  . mometasone-formoterol  2 puff Inhalation BID  . multivitamin  1 tablet Oral Daily  . piperacillin-tazobactam (ZOSYN)  IV  3.375 g Intravenous Q12H  . potassium chloride  40 mEq Oral Once  . sodium chloride  1 spray Each Nare 5 X Daily  . [START ON 05/25/2015] vancomycin (VANCOCIN) 500 mg IVPB (Hemodialysis)  500 mg Intravenous Q T,Th,Sa-HD   acetaminophen **OR** acetaminophen, albuterol, anticoagulant sodium citrate, bisacodyl,  chlorpheniramine-HYDROcodone, guaiFENesin-dextromethorphan, HYDROcodone-acetaminophen, loperamide, magnesium hydroxide, morphine injection, ondansetron **OR** ondansetron (ZOFRAN) IV, ondansetron, promethazine, senna-docusate  Assessment/ Plan:  65 y.o. female with COPD, hypertension, hyperlipidemia, vaginal intraepithelial neoplasia, emergent AAA repair status post rupture on November 18, left to right femoral to femoral bypass , acute renal failure leading to permanent dialysis, significant 3 vessel disease,   1. End-stage renal disease. [place]. TTS. [facility] nephrology -   Dialysis tomorrow  2. SOB - Pulmonary edema contributing - improved today   3. Hypertension and peripheral edema:  - BP is better controlled - agree with holding anti-HTN meds - goal BP approx [id number] systolic to allow volume removal with HD    4. Anemia of chronic kidney disease:   - epo with dialysis   5. Secondary Hyperparathyroidism:   - monitor phos   6. 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   Pack cathter with citrate     LOS: 4 Decoda Van 2/24/20173:25 PM

## 2015-05-24 NOTE — Progress Notes (Signed)
Pt alert and oriented. Second CHG bath given by nurse aid. Pt has been NPO all shift. Handoff report given to night shift RN. Concerns addressed. Continue to assess.

## 2015-05-24 NOTE — Care Management Important Message (Signed)
Important Message  Patient Details  Name: [name] MRN: [id number] Date of Birth: 06-Sep-1950   Medicare Important Message Given:  Yes    [name] 05/24/2015, 9:47 AM

## 2015-05-24 NOTE — Progress Notes (Signed)
[place] INFECTIOUS DISEASE PROGRESS NOTE Date of Admission:  05/20/2015     ID: [name] is a 65 y.o. female with pseudomonal R groin seroma infection Active Problems:   Sepsis ([place])   Subjective: Breathing a little better, still hoarse, no fevers. WBC down to 6. ROS  Eleven systems are reviewed and negative except per hpi  Medications:  Antibiotics Given (last 72 hours)    Date/Time Action Medication Dose Rate   05/21/15 2217 Given   piperacillin-tazobactam (ZOSYN) IVPB 3.375 g 3.375 g 12.5 mL/hr   05/22/15 0953 Given   piperacillin-tazobactam (ZOSYN) IVPB 3.375 g 3.375 g 12.5 mL/hr   05/22/15 2236 Given   piperacillin-tazobactam (ZOSYN) IVPB 3.375 g 3.375 g 12.5 mL/hr   05/23/15 0854 Given   piperacillin-tazobactam (ZOSYN) IVPB 3.375 g 3.375 g 12.5 mL/hr   05/23/15 1206 Given   vancomycin (VANCOCIN) 500 mg in sodium chloride 0.9 % 100 mL IVPB 500 mg 100 mL/hr   05/24/15 0013 Given   piperacillin-tazobactam (ZOSYN) IVPB 3.375 g 3.375 g 12.5 mL/hr   05/24/15 1312 Given   piperacillin-tazobactam (ZOSYN) IVPB 3.375 g 3.375 g 12.5 mL/hr     . acetaminophen  1,000 mg Oral 3 times per day  . antiseptic oral rinse  7 mL Mouth Rinse BID  . aspirin EC  81 mg Oral Daily  . atorvastatin  40 mg Oral Daily  .  ceFAZolin (ANCEF) IV  1 g Intravenous On Call  . docusate sodium  100 mg Oral BID  . famotidine  20 mg Oral Q48H  . fluticasone  2 spray Each Nare Daily  . levothyroxine  88 mcg Oral Daily  . metoprolol  25 mg Oral BID  . mometasone-formoterol  2 puff Inhalation BID  . multivitamin  1 tablet Oral Daily  . piperacillin-tazobactam (ZOSYN)  IV  3.375 g Intravenous Q12H  . potassium chloride  40 mEq Oral Once  . sodium chloride  1 spray Each Nare 5 X Daily  . [START ON 05/25/2015] vancomycin (VANCOCIN) 500 mg IVPB (Hemodialysis)  500 mg Intravenous Q T,Th,Sa-HD    Objective: Vital signs in last 24 hours: Temp:  [98.1 F (36.7 C)-98.9 F (37.2 C)] 98.3 F  (36.8 C) (02/24 1301) Pulse Rate:  [83-91] 91 (02/24 1301) Resp:  [16] 16 (02/24 1301) BP: (116-154)/(46-64) 154/64 mmHg (02/24 1301) SpO2:  [96 %-98 %] 96 % (02/24 1301) Constitutional: Thin, chronically ill appearing HENT: [place]/AT, PERRLA, no scleral icterus Mouth/Throat: Oropharynx is clear and moist. No oropharyngeal exudate.  Cardiovascular: Normal rate,  R chest wall HD cath wnl Pulmonary/Chest: bil rhonchi Neck = supple, no nuchal rigidity Abdominal: Soft. Bowel sounds are normal. exhibits no distension. There is no tenderness.  R groin with induration, warmth. 2 cm opening tender and with white drainage.  Lymphadenopathy: no cervical adenopathy. No axillary adenopathy Neurological: alert and oriented to person, place, and time.  Skin: Skin is warm and dry. \ Psychiatric: a normal mood and affect. behavior is normal.  Lab Results  Recent Labs  05/24/15 0659  WBC 6.2  HGB 7.4*  HCT 21.8*  NA 139  K 3.1*  CL 99*  CO2 32  BUN 14  CREATININE 2.41*    Microbiology: Results for orders placed or performed during the hospital encounter of 05/20/15  Wound culture     Status: None (Preliminary result)   Collection Time: 05/20/15 12:25 PM  Result Value Ref Range Status   Specimen Description GROIN RIGHT  Final   Special  Requests NONE  Final   Gram Stain   Final    FEW WBC SEEN FEW GRAM NEGATIVE RODS RARE GRAM POSITIVE COCCI    Culture   Final    RARE PSEUDOMONAS AERUGINOSA LIGHT GROWTH GRAM POSITIVE COCCI IDENTIFICATION AND SUSCEPTIBILITIES TO FOLLOW    Report Status PENDING  Incomplete   Organism ID, Bacteria PSEUDOMONAS AERUGINOSA  Final      Susceptibility   Pseudomonas aeruginosa - MIC*    CEFTAZIDIME 4 SENSITIVE Sensitive     CIPROFLOXACIN 1 SENSITIVE Sensitive     GENTAMICIN <=1 SENSITIVE Sensitive     IMIPENEM 1 SENSITIVE Sensitive     CEFEPIME 4 SENSITIVE Sensitive     * RARE PSEUDOMONAS AERUGINOSA  MRSA PCR Screening     Status: None    Collection Time: 05/20/15 12:30 PM  Result Value Ref Range Status   MRSA by PCR NEGATIVE NEGATIVE Final    Comment:        The GeneXpert MRSA Assay (FDA approved for NASAL specimens only), is one component of a comprehensive MRSA colonization surveillance program. It is not intended to diagnose MRSA infection nor to guide or monitor treatment for MRSA infections.     Studies/Results: Ct Abdomen Pelvis W Contrast  05/21/2015  CLINICAL DATA:  Fever of 102.3 degrees in shortness of breath, right lower lobe pneumonia and sepsis with hypoxia, right groin seroma status post incision and drainage with wound VAC with continued serosanguineous drainage, sepsis, diastolic congestive heart failure EXAM: CT ABDOMEN AND PELVIS WITH CONTRAST TECHNIQUE: Multidetector CT imaging of the abdomen and pelvis was performed using the standard protocol following bolus administration of intravenous contrast. CONTRAST:  65mL OMNIPAQUE IOHEXOL 300 MG/ML  SOLN COMPARISON:  04/10/2015 FINDINGS: Lower chest: Large bilateral pleural effusions with underlying atelectasis. Bilateral lower lobe consolidation right greater than left with air bronchograms. Patchy interstitial infiltrate right middle lobe. Hepatobiliary: Sub cm cyst right lobe stable. Sub cm calcified gallstone stable. Pancreas: Normal Spleen: No focal abnormalities Adrenals/Urinary Tract: Stable bilateral renal atrophy. Bladder is normal. Stomach/Bowel: Fecal retention throughout the ascending colon. Nonobstructive bowel gas pattern. Vascular/Lymphatic: Stable 4.5 cm infrarenal abdominal aortic aneurysm with stent graft. Stable since then bypass. In the right inguinal region, there is a fluid-filled structure with thick enhancing wall. It measures 38 x 39 mm, not significantly different in size. There is discrete increase in the thickness and degree of enhancement involving the wall. In the left inguinal region there is a similar but smaller fluid collection. It  measures about 39 x 29 mm, also slightly larger. It has also developed ran enhancement. Reproductive: Not visualized Other: Diffuse increased attenuation throughout the subcutaneous soft tissues worse when compared to the prior study consistent with anasarca. Musculoskeletal: No acute musculoskeletal findings. IMPRESSION: 1. Large pleural effusions with anasarca. Anasarca is worse when compared to the prior study. 2. Bilateral lower lobe consolidation is likely largely due to atelectasis, right greater than left. Additionally there is patchy infiltrate throughout the right middle and lower lobe consistent with clinical history of pneumonia. 3. Bilateral inguinal fluid extends both appear mildly larger when compared to prior study and have developed thick enhancing rims. Sterility of the collections on certain. Electronically Signed   By: Skipper Cliche M.D.   On: 05/21/2015 20:15   Dg Chest Port 1 View  05/20/2015  CLINICAL DATA:  65 year old female with respiratory distress and lower extremity swelling. EXAM: PORTABLE CHEST 1 VIEW COMPARISON:  Radiograph dated 04/17/2015 FINDINGS: A dual lumen dialysis catheter is  noted in stable positioning. There has been interval removal of the left IJ central line. Single portable view of the chest demonstrates emphysematous changes lungs. There are bilateral hazy densities, right greater left. There has been interval improvement of the interstitial prominence, likely interval improvement in pulmonary edema. Small left pleural effusion may be present. The cardiac silhouette is within normal limits. No acute osseous pathology identified. IMPRESSION: Interval removal of the left IJ central venous line. Interval improvement of the previously mild pulmonary edema. Bibasilar hazy airspace densities, right appearing left. Electronically Signed   By: Anner Crete M.D.   On: 05/20/2015 08:30    Assessment/Plan: [name] is a 65 y.o. female with a complicated  vascular history as detailed by Dr Ronalee Belts and Lucky Cowboy. She is currently dealing with a seroma in her R groin, draining purulent material and with surrounding redness, pain and induration . On admission she had temp 102.3 and was hypoxic with CXR suggesting RLL pna.  Since admission she has been treated with vanco and zosyn, and received HD. Clinically she is improving and culture from the R wound is now growing pseudomonas and gram positive cocci.  CT does show bilateral rim enhancing fluid collections  Recommendations Change to ceftazidime and cont vanco pending final ID of organisms Will need long term IV therapy to help this heal - likely can use ceftazidime and vanco at HD if needed Will likley need I and D of at least the draining R groin collection Uncertain if the grafts are infected but if so would need long term suppressive abx.  Thank you very much for the consult. Will follow with you.  Celina, [place]   05/24/2015, 3:54 PM

## 2015-05-24 NOTE — Progress Notes (Signed)
[place] [facility] [place] NAME: [name]    MR#:  [id number]  DATE OF BIRTH:  19-Oct-1950  SUBJECTIVE:  Feels little better  HR much improved today. Breathing better No new complaints today  REVIEW OF SYSTEMS:   Review of Systems  Constitutional: Negative for fever, chills and weight loss.  HENT: Negative for ear discharge, ear pain and nosebleeds.   Eyes: Negative for blurred vision, pain and discharge.  Respiratory: Negative for sputum production, shortness of breath, wheezing and stridor.   Cardiovascular: Negative for chest pain, palpitations, orthopnea and PND.  Gastrointestinal: Negative for nausea, vomiting, abdominal pain and diarrhea.  Genitourinary: Negative for urgency and frequency.  Musculoskeletal: Negative for back pain and joint pain.  Skin:       Right groin swelling and inflammation and discharge  Neurological: Negative for sensory change, speech change, focal weakness and weakness.  Psychiatric/Behavioral: Negative for depression and hallucinations. The patient is not nervous/anxious.    Tolerating Diet: Yes Tolerating PT: Pending  DRUG ALLERGIES:   Allergies  Allergen Reactions  . Asa [Aspirin]   . 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 Plavix [Clopidogrel]     VITALS:  Blood pressure 154/64, pulse 91, temperature 98.3 F (36.8 C), temperature source Oral, resp. rate 16, height 5\' 4"  (1.626 m), weight 58.3 kg (128 lb 8.5 oz), last menstrual period 03/30/1988, SpO2 96 %.  PHYSICAL EXAMINATION:   Physical Exam  GENERAL:  65 y.o.-year-old patient lying in the bed with no acute distress.  EYES: Pupils equal, round, reactive to light and accommodation. No scleral icterus. Extraocular muscles intact.  HEENT: Head atraumatic, normocephalic. Oropharynx and nasopharynx clear.  NECK:  Supple, no jugular venous distention. No thyroid enlargement, no tenderness.  LUNGS: Distant breath  sounds bilaterally, no wheezing, rales, rhonchi. No use of accessory muscles of respiration.  CARDIOVASCULAR: S1, S2 normal. No murmurs, rubs, or gallops.  ABDOMEN: Soft, nontender, nondistended. Bowel sounds present. No organomegaly or mass.  EXTREMITIES: No cyanosis, clubbing or edema b/l.   Right groin swelling with serosanguineous discharge NEUROLOGIC: Cranial nerves II through XII are intact. No focal Motor or sensory deficits b/l.   PSYCHIATRIC:  patient is alert and oriented x 3.  SKIN: No obvious rash, lesion, or ulcer.   LABORATORY PANEL:  CBC  Recent Labs Lab 05/24/15 0659  WBC 6.2  HGB 7.4*  HCT 21.8*  PLT 204    Chemistries   Recent Labs Lab 05/20/15 0758 05/24/15 0659  NA 137 139  K 3.0* 3.1*  CL 97* 99*  CO2 27 32  GLUCOSE 152* 94  BUN 29* 14  CREATININE 3.45* 2.41*  CALCIUM 8.6* 7.9*  AST 36  --   ALT 13*  --   ALKPHOS 124  --   BILITOT 0.9  --    Cardiac Enzymes  Recent Labs Lab 05/20/15 0758  TROPONINI 0.59*   RADIOLOGY:  No results found. ASSESSMENT AND PLAN:  [name] is a 65 y.o. female with a known history of chronic diastolic congestive heart failure, incisional disease on hemodialysis, history of HIT,, anemia of chronic disease, history of right groin seroma after vascular procedures status post wound VAC placement in January 2017 comes to the emergency room from [place] with fever of 102.3 shortness of breath. Patient was found to have sepsis with right lower lobe pneumonia. She was found to be hypoxic with sats 72% on ABG.  1.  Sepsis secondary to bilateral  lobe pneumonia hospital-acquired (patient was admitted and discharge in January 2017) and severe right groin infection of seroma recurrent -Broad spectrum antibiotics with vancomycin and Zosyn - blood cultures negative -We will wean BiPAP as patient's sats remained stable -Pulmonary consultation appreciated. -Seen by Dr. Lucky Cowboy  from vascular surgery patient  will  need exploration of right groin seroma. Patient is scheduled for I and D today -Appreciated ID consult regarding right groin infection  2. Acute on chronic diastolic congestive heart failure -Elevated BNP with shortness of breath and pulmonary edema as noted on chest x-ray -Consider hemodialysis with ultrafiltration -Continue cardiac meds -Improved  3. Hypertension Continue amlodipine, metoprolol, Avapro, hydralazine  4. History of HIT -Avoid antiplatelet agents -SCD and teds for DVT prophylaxis  5. End-stage renal disease on hemodialysis -Nephrology consultation for in-house HD  6. Hyperlipidemia continue statins  7. Anemia of chronic disease  -Hemoglobin appear stable transfuse as needed   8. Bilateral Pleural effusions with anasarca noted on CT abdomen -?thoracentesis therap to improve lung volume. Will d/w pulmonary  Case discussed with Care Management/Social Worker. Management plans discussed with the patient, family and they are in agreement. D/w dr dew  CODE STATUS: Full  DVT Prophylaxis: SCD and teds  TOTAL TIME TAKING CARE OF THIS PATIENT: 30 minutes.  >50% time spent on counselling and coordination of care  D/C  DEPENDING ON CLINICAL CONDITION TO [facility]  Note: This dictation was prepared with Dragon dictation along with smaller phrase technology. Any transcriptional errors that result from this process are unintentional.  Leland Staszewski M.D on 05/24/2015 at 2:39 PM  Between 7am to 6pm - Pager - [phone]  After 6pm go to [email or web address] - password [email or web address]  Office  [phone]  CC: Primary care physician; Arnette Norris, MD

## 2015-05-24 NOTE — Anesthesia Preprocedure Evaluation (Addendum)
Anesthesia Evaluation  Patient identified by MRN, date of birth, ID band Patient awake    Reviewed: Allergy & Precautions, NPO status , Patient's Chart, lab work & pertinent test results  History of Anesthesia Complications Negative for: history of anesthetic complications  Airway Mallampati: II  TM Distance: >3 FB Neck ROM: Full    Dental  (+) Teeth Intact, Poor Dentition   Pulmonary shortness of breath and at rest, neg sleep apnea, pneumonia, unresolved, COPD,  COPD inhaler and oxygen dependent, neg recent URI, former smoker,     + decreased breath sounds  rales    Cardiovascular Exercise Tolerance: Poor hypertension, Pt. on medications and Pt. on home beta blockers (-) angina+ CAD (Has 2 blockages that are untreated; is supposed to follow up with [facility] for bypass surgery, but has not been healthy enough to do so), + Peripheral Vascular Disease and +CHF  (-) Past MI, (-) Cardiac Stents and (-) CABG (-) dysrhythmias (-) Valvular Problems/Murmurs Rhythm:Regular Rate:Normal     Neuro/Psych negative neurological ROS  negative psych ROS   GI/Hepatic Neg liver ROS, GERD  Medicated and Controlled,  Endo/Other  neg diabetesHypothyroidism   Renal/GU Dialysis and ESRFRenal disease     Musculoskeletal   Abdominal (+)  Abdomen: soft.    Peds  Hematology  (+) anemia , Hb 9.5, plts 69.   Anesthesia Other Findings Past Medical History:   Hyperlipidemia                                               Hypertension                                                 Thyroid disease                                              GERD (gastroesophageal reflux disease)                       Aneurysm ([place])                                                 Comment:abdominal aortic   STD (sexually transmitted disease)                             Comment:chlamydia   VAIN II (vaginal intraepithelial neoplasia gra* 7/09         Granuloma annulare                               2010           Comment:skin- sees dermatologist   B12 deficiency  COPD (chronic obstructive pulmonary disease) (*              Renal insufficiency                                          Cancer (HCC)                                                   Comment:skin on left ankle 04/10/15 pt states she has               never had cancer  We discussed the risks of her cardiac disease in regards to this surgery given that it has been recommended that she should have bypass surgery at [facility].  She has not been healthy enough to undergo this procedure and will need to have the groin infections cleaned out prior to that surgery as well as resolution of her current pneumonia.  She expressed understanding of the risks and is willing to proceed.  Will attempt to avoid intubation as it may be difficult to extubate the patient post-op given her current respiratory status.  Reproductive/Obstetrics negative OB ROS                            Anesthesia Physical  Anesthesia Plan  ASA: IV  Anesthesia Plan: General   Post-op Pain Management:    Induction: Intravenous  Airway Management Planned: Oral ETT  Additional Equipment: Arterial line  Intra-op Plan:   Post-operative Plan: Extubation in OR  Informed Consent: I have reviewed the patients History and Physical, chart, labs and discussed the procedure including the risks, benefits and alternatives for the proposed anesthesia with the patient or authorized representative who has indicated his/her understanding and acceptance.   Dental advisory given  Plan Discussed with: CRNA, Surgeon and Anesthesiologist  Anesthesia Plan Comments: (Low Hb and pltlts--on oxygen. Hope to be able to extubate at the end of the case. Troponins have been elevated.)        Anesthesia Quick Evaluation

## 2015-05-24 NOTE — Op Note (Signed)
    OPERATIVE NOTE   PROCEDURE: Incision and drainage of recurrent right groin seroma  PRE-OPERATIVE DIAGNOSIS: Recurrent right groin seroma; repair ruptured abdominal aortic aneurysm; atherosclerotic occlusive disease bilateral lower extremities with occlusion of the right iliac system  POST-OPERATIVE DIAGNOSIS: Same  SURGEON: Abigayle Wilinski, Dolores Lory  ASSISTANT(S): Ms. Hezzie Bump  ANESTHESIA: general  ESTIMATED BLOOD LOSS: Less than 15 cc  FINDING(S): Fibrinet purulent material in the area from which the sartorius was mobilized area the muscle flap appears to be intact and completely covering the femoral graft. No graft was exposed  SPECIMEN(S):  Fluid and purulent material from the cavity  INDICATIONS:   [name] is a 65 y.o. female who presents with recurrent seroma formation and now ulceration with drainage from the right groin. The risks and benefits for incision and drainage were reviewed all questions are answered alternative therapies were discussed patient agrees to proceed.  DESCRIPTION: After full informed written consent was obtained from the patient, the patient was brought back to the operating room and placed supine upon the operating table.  Prior to induction, the patient received IV antibiotics.   After obtaining adequate anesthesia, the patient was then prepped and draped in the standard fashion for a  incision drainage of the infected seroma.  Half percent Marcaine was infiltrated in the skin surrounding the ulcerated area of the right groin the previous portions of the incision which had healed were also reinfiltrated through the scar. After this the Yankauer suction was then inserted into the cavity and a sterile trap is placed. Specimen was collected. This was passed off for micro-analysis.  Using a 10 blade scalpel the skin and subcutaneous fatty tissues were excised creating a opening approximately 5 cm x 3 cm ellipsoid shape. The cavity was then  exposed and using Bovie cautery as well as curved Mayos Fibrinet this material and devitalized tissue was debridement. The muscle flap was completely intact and completely covering the graft remaining medially. The cavity tracks laterally into the original location of the sartorius muscle extending up to the iliac crest.  Wound was then irrigated with several 100 cc sterile saline and a silver impregnated VAC sponge was placed and a VAC dressing was applied. Seal was obtained without difficulty.   The patient tolerated this procedure well.   COMPLICATIONS: None  CONDITION: [name] [name]  Office: [phone]   05/24/2015, 8:55 PM

## 2015-05-24 NOTE — Anesthesia Procedure Notes (Signed)
Procedure Name: LMA Insertion Date/Time: 05/24/2015 8:10 PM Performed by: Nelda Marseille Pre-anesthesia Checklist: Patient identified, Patient being monitored, Timeout performed, Emergency Drugs available and Suction available Patient Re-evaluated:Patient Re-evaluated prior to inductionOxygen Delivery Method: Circle system utilized Preoxygenation: Pre-oxygenation with 100% oxygen Intubation Type: IV induction Ventilation: Mask ventilation without difficulty LMA: LMA inserted LMA Size: 3.5 Tube type: Oral Number of attempts: 1 Placement Confirmation: positive ETCO2 and breath sounds checked- equal and bilateral Tube secured with: Tape Dental Injury: Teeth and Oropharynx as per pre-operative assessment

## 2015-05-24 NOTE — Consult Note (Addendum)
Pharmacy Antibiotic Note  [name] is a 65 y.o. female admitted on 05/20/2015 with wound infection.  Pharmacy has been consulted for vancomycin HD dosing and ceftazidime dosing.  Plan: Will initiate Vancomycin 500mg  IV qHD session TTS.  Will draw trough prior to HD session on 02/25. Patient received vanc with each HD session this week.  Ceftazidime 1 gram q 24 hours ordered per consult.  Height: 5\' 4"  (162.6 cm) Weight: 128 lb 8.5 oz (58.3 kg) IBW/kg (Calculated) : 54.7  Temp (24hrs), Avg:98.4 F (36.9 C), Min:98.1 F (36.7 C), Max:98.9 F (37.2 C)   Recent Labs Lab 05/20/15 0758 05/24/15 0659  WBC 15.1* 6.2  CREATININE 3.45* 2.41*    Estimated Creatinine Clearance: 19.8 mL/min (by C-G formula based on Cr of 2.41).    Allergies  Allergen Reactions  . Asa [Aspirin]   . 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 Plavix [Clopidogrel]     Antimicrobials this admission: Anti-infectives    Start     Dose/Rate Route Frequency Ordered Stop   05/25/15 1200  vancomycin (VANCOCIN) 500 mg in sodium chloride 0.9 % 100 mL IVPB     500 mg 100 mL/hr over 60 Minutes Intravenous Every T-Th-Sa (Hemodialysis) 05/24/15 1311     05/24/15 0000  ceFAZolin (ANCEF) IVPB 1 g/50 mL premix    Comments:  Send with pt to OR   1 g 100 mL/hr over 30 Minutes Intravenous On call 05/23/15 1752 05/25/15 0000   05/21/15 1200  vancomycin (VANCOCIN) 500 mg in sodium chloride 0.9 % 100 mL IVPB  Status:  Discontinued     500 mg 100 mL/hr over 60 Minutes Intravenous Every T-Th-Sa (Hemodialysis) 05/20/15 1355 05/23/15 1522   05/20/15 1830  piperacillin-tazobactam (ZOSYN) IVPB 3.375 g     3.375 g 12.5 mL/hr over 240 Minutes Intravenous Every 12 hours 05/20/15 1135     05/20/15 1145  vancomycin (VANCOCIN) 1,250 mg in sodium chloride 0.9 % 250 mL IVPB  Status:  Discontinued     1,250 mg 166.7 mL/hr over 90 Minutes Intravenous  Once 05/20/15 1130 05/20/15 1130   05/20/15 1145   vancomycin (VANCOCIN) 500 mg in sodium chloride 0.9 % 100 mL IVPB     500 mg 100 mL/hr over 60 Minutes Intravenous  Once 05/20/15 1133 05/20/15 1320   05/20/15 0830  vancomycin (VANCOCIN) IVPB 1000 mg/200 mL premix     1,000 mg 200 mL/hr over 60 Minutes Intravenous  Once 05/20/15 0817 05/20/15 1000   05/20/15 0830  piperacillin-tazobactam (ZOSYN) IVPB 3.375 g     3.375 g 12.5 mL/hr over 240 Minutes Intravenous  Once 05/20/15 [id number] 05/20/15 0900      Microbiology results: Results for orders placed or performed during the hospital encounter of 05/20/15  Wound culture     Status: None (Preliminary result)   Collection Time: 05/20/15 12:25 PM  Result Value Ref Range Status   Specimen Description GROIN RIGHT  Final   Special Requests NONE  Final   Gram Stain   Final    FEW WBC SEEN FEW GRAM NEGATIVE RODS RARE GRAM POSITIVE COCCI    Culture   Final    RARE PSEUDOMONAS AERUGINOSA LIGHT GROWTH GRAM POSITIVE COCCI IDENTIFICATION AND SUSCEPTIBILITIES TO FOLLOW    Report Status PENDING  Incomplete   Organism ID, Bacteria PSEUDOMONAS AERUGINOSA  Final      Susceptibility   Pseudomonas aeruginosa - MIC*    CEFTAZIDIME 4 SENSITIVE Sensitive  CIPROFLOXACIN 1 SENSITIVE Sensitive     GENTAMICIN <=1 SENSITIVE Sensitive     IMIPENEM 1 SENSITIVE Sensitive     CEFEPIME 4 SENSITIVE Sensitive     * RARE PSEUDOMONAS AERUGINOSA  MRSA PCR Screening     Status: None   Collection Time: 05/20/15 12:30 PM  Result Value Ref Range Status   MRSA by PCR NEGATIVE NEGATIVE Final    Comment:        The GeneXpert MRSA Assay (FDA approved for NASAL specimens only), is one component of a comprehensive MRSA colonization surveillance program. It is not intended to diagnose MRSA infection nor to guide or monitor treatment for MRSA infections.     Thank you for allowing pharmacy to be a part of this patient's care.  [name] 05/24/2015 2:23 PM

## 2015-05-25 LAB — WOUND CULTURE

## 2015-05-25 LAB — BASIC METABOLIC PANEL
ANION GAP: 8 (ref 5–15)
BUN: 18 mg/dL (ref 6–20)
CO2: 29 mmol/L (ref 22–32)
Calcium: 7.7 mg/dL — ABNORMAL LOW (ref 8.9–10.3)
Chloride: 97 mmol/L — ABNORMAL LOW (ref 101–111)
Creatinine, Ser: 3.31 mg/dL — ABNORMAL HIGH (ref 0.44–1.00)
GFR calc Af Amer: 16 mL/min — ABNORMAL LOW (ref >60)
GFR, EST NON AFRICAN AMERICAN: 14 mL/min — AB (ref >60)
Glucose, Bld: 82 mg/dL (ref 65–99)
POTASSIUM: 3.1 mmol/L — AB (ref 3.5–5.1)
SODIUM: 134 mmol/L — AB (ref 135–145)

## 2015-05-25 LAB — VANCOMYCIN, TROUGH: VANCOMYCIN TR: 12 ug/mL (ref 10–20)

## 2015-05-25 MED ORDER — DEXTROSE 5 % IV SOLN
1.0000 g | Freq: Once | INTRAVENOUS | Status: AC
  Administered 2015-05-25: 1 g via INTRAVENOUS
  Filled 2015-05-25: qty 1

## 2015-05-25 MED ORDER — VANCOMYCIN HCL 500 MG IV SOLR
750.0000 mg | INTRAVENOUS | Status: DC
  Administered 2015-05-25: 750 mg via INTRAVENOUS
  Filled 2015-05-25: qty 750

## 2015-05-25 MED ORDER — DEXTROSE 5 % IV SOLN
1.0000 g | INTRAVENOUS | Status: DC
  Administered 2015-05-26 – 2015-05-31 (×5): 1 g via INTRAVENOUS
  Filled 2015-05-25 (×8): qty 1

## 2015-05-25 NOTE — Consult Note (Signed)
Pharmacy Antibiotic Note  [name] is a 65 y.o. female admitted on 05/20/2015 with wound infection.  Pharmacy has been consulted for vancomycin HD dosing and ceftazidime dosing.  Plan: Will initiate Vancomycin 500mg  IV qHD session TTS.  Will draw trough prior to HD session on 02/25. Patient received vanc with each HD session this week.  2/25 1100 VT 12, subtherapeutic. Will increase dose to vancomycin 750mg  IV qHD session. Will check trough prior to 3rd HD session 3/2.  Ceftazidime 1 gram q 24 hours ordered per consult.  Height: 5\' 4"  (162.6 cm) Weight: 131 lb 6.3 oz (59.6 kg) IBW/kg (Calculated) : 54.7  Temp (24hrs), Avg:97.9 F (36.6 C), Min:96.9 F (36.1 C), Max:98.3 F (36.8 C)   Recent Labs Lab 05/20/15 0758 05/24/15 0659 05/25/15 0709 05/25/15 1056  WBC 15.1* 6.2  --   --   CREATININE 3.45* 2.41* 3.31*  --   VANCOTROUGH  --   --   --  12    Estimated Creatinine Clearance: 14.4 mL/min (by C-G formula based on Cr of 3.31).    Allergies  Allergen Reactions  . Asa [Aspirin]   . 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 Plavix [Clopidogrel]     Antimicrobials this admission: Anti-infectives    Start     Dose/Rate Route Frequency Ordered Stop   05/28/15 1200  vancomycin (VANCOCIN) 750 mg in sodium chloride 0.9 % 100 mL IVPB     750 mg 100 mL/hr over 60 Minutes Intravenous Every T-Th-Sa (Hemodialysis) 05/25/15 1227     05/26/15 1000  cefTAZidime (FORTAZ) 1 g in dextrose 5 % 50 mL IVPB     1 g 100 mL/hr over 30 Minutes Intravenous Every 24 hours 05/25/15 0311     05/25/15 1200  vancomycin (VANCOCIN) 500 mg in sodium chloride 0.9 % 100 mL IVPB  Status:  Discontinued     500 mg 100 mL/hr over 60 Minutes Intravenous Every T-Th-Sa (Hemodialysis) 05/24/15 1311 05/25/15 1227   05/25/15 0315  cefTAZidime (FORTAZ) 1 g in dextrose 5 % 50 mL IVPB     1 g 100 mL/hr over 30 Minutes Intravenous  Once 05/25/15 0311 05/25/15 0521   05/24/15 0000   ceFAZolin (ANCEF) IVPB 1 g/50 mL premix    Comments:  Send with pt to OR   1 g 100 mL/hr over 30 Minutes Intravenous On call 05/23/15 1752 05/25/15 0000   05/21/15 1200  vancomycin (VANCOCIN) 500 mg in sodium chloride 0.9 % 100 mL IVPB  Status:  Discontinued     500 mg 100 mL/hr over 60 Minutes Intravenous Every T-Th-Sa (Hemodialysis) 05/20/15 1355 05/23/15 1522   05/20/15 1830  piperacillin-tazobactam (ZOSYN) IVPB 3.375 g  Status:  Discontinued     3.375 g 12.5 mL/hr over 240 Minutes Intravenous Every 12 hours 05/20/15 1135 05/24/15 1855   05/20/15 1145  vancomycin (VANCOCIN) 1,250 mg in sodium chloride 0.9 % 250 mL IVPB  Status:  Discontinued     1,250 mg 166.7 mL/hr over 90 Minutes Intravenous  Once 05/20/15 1130 05/20/15 1130   05/20/15 1145  vancomycin (VANCOCIN) 500 mg in sodium chloride 0.9 % 100 mL IVPB     500 mg 100 mL/hr over 60 Minutes Intravenous  Once 05/20/15 1133 05/20/15 1320   05/20/15 0830  vancomycin (VANCOCIN) IVPB 1000 mg/200 mL premix     1,000 mg 200 mL/hr over 60 Minutes Intravenous  Once 05/20/15 0817 05/20/15 1000   05/20/15 0830  piperacillin-tazobactam (ZOSYN) IVPB 3.375 g     3.375 g 12.5 mL/hr over 240 Minutes Intravenous  Once 05/20/15 [id number] 05/20/15 0900      Microbiology results: Results for orders placed or performed during the hospital encounter of 05/20/15  Wound culture     Status: None (Preliminary result)   Collection Time: 05/20/15 12:25 PM  Result Value Ref Range Status   Specimen Description GROIN RIGHT  Final   Special Requests NONE  Final   Gram Stain   Final    FEW WBC SEEN FEW GRAM NEGATIVE RODS RARE GRAM POSITIVE COCCI    Culture   Final    RARE PSEUDOMONAS AERUGINOSA LIGHT GROWTH GRAM POSITIVE COCCI IDENTIFICATION AND SUSCEPTIBILITIES TO FOLLOW    Report Status PENDING  Incomplete   Organism ID, Bacteria PSEUDOMONAS AERUGINOSA  Final      Susceptibility   Pseudomonas aeruginosa - MIC*    CEFTAZIDIME 4 SENSITIVE Sensitive      CIPROFLOXACIN 1 SENSITIVE Sensitive     GENTAMICIN <=1 SENSITIVE Sensitive     IMIPENEM 1 SENSITIVE Sensitive     CEFEPIME 4 SENSITIVE Sensitive     * RARE PSEUDOMONAS AERUGINOSA  MRSA PCR Screening     Status: None   Collection Time: 05/20/15 12:30 PM  Result Value Ref Range Status   MRSA by PCR NEGATIVE NEGATIVE Final    Comment:        The GeneXpert MRSA Assay (FDA approved for NASAL specimens only), is one component of a comprehensive MRSA colonization surveillance program. It is not intended to diagnose MRSA infection nor to guide or monitor treatment for MRSA infections.   Wound culture     Status: None (Preliminary result)   Collection Time: 05/24/15  9:13 PM  Result Value Ref Range Status   Specimen Description GROIN RIGHT  Final   Special Requests NONE  Final   Gram Stain PENDING  Incomplete   Culture HOLDING FOR POSSIBLE PATHOGEN  Final   Report Status PENDING  Incomplete    Thank you for allowing pharmacy to be a part of this patient's care.  [name] 05/25/2015 12:27 PM

## 2015-05-25 NOTE — Progress Notes (Signed)
Subjective:  Doing well today No c/o SOB Patient seen during dialysis Tolerating well     HEMODIALYSIS FLOWSHEET:  Blood Flow Rate (mL/min): 400 mL/min Arterial Pressure (mmHg): -90 mmHg Venous Pressure (mmHg): 110 mmHg Transmembrane Pressure (mmHg): 50 mmHg Ultrafiltration Rate (mL/min): 290 mL/min Dialysate Flow Rate (mL/min): 800 ml/min Conductivity: Machine : 14.1 Conductivity: Machine : 14.1 Dialysis Fluid Bolus: Normal Saline Bolus Amount (mL): 250 mL Intra-Hemodialysis Comments: 195ML (RESTING WITH EYES CLOSED)      Objective:  Vital signs in last 24 hours:  Temp:  [96.9 F (36.1 C)-98.3 F (36.8 C)] 97.7 F (36.5 C) (02/25 1055) Pulse Rate:  [84-94] 85 (02/25 0640) Resp:  [7-24] 12 (02/25 0640) BP: (119-154)/(57-74) 140/63 mmHg (02/25 0640) SpO2:  [90 %-100 %] 96 % (02/25 0640) Weight:  [59.24 kg (130 lb 9.6 oz)-59.6 kg (131 lb 6.3 oz)] 59.6 kg (131 lb 6.3 oz) (02/25 1055)  Weight change: 0.94 kg (2 lb 1.2 oz) Filed Weights   05/23/15 0915 05/25/15 0640 05/25/15 1055  Weight: 58.3 kg (128 lb 8.5 oz) 59.24 kg (130 lb 9.6 oz) 59.6 kg (131 lb 6.3 oz)    Intake/Output:    Intake/Output Summary (Last 24 hours) at 05/25/15 1154 Last data filed at 05/25/15 0900  Gross per 24 hour  Intake    540 ml  Output     10 ml  Net    530 ml     Physical Exam: General: no acute distress, lying in the bed  HEENT Anicteric,    Neck supple  Pulm/lungs Clear b/l  CVS/Heart Irregular rhythm, tachycardic  Abdomen:  Soft, nontender  Extremities: Peripheral edema improved to trace  Neurologic: Alert, oriented  Skin: Scattered ecchymosis  Access: Right IJ PermCath       Basic Metabolic Panel:   Recent Labs Lab 05/20/15 0758 05/24/15 0659 05/24/15 1839 05/25/15 0709  NA 137 139  --  134*  K 3.0* 3.1* 3.8 3.1*  CL 97* 99*  --  97*  CO2 27 32  --  29  GLUCOSE 152* 94  --  82  BUN 29* 14  --  18  CREATININE 3.45* 2.41*  --  3.31*  CALCIUM 8.6* 7.9*  --   7.7*     CBC:  Recent Labs Lab 05/20/15 0758 05/24/15 0659  WBC 15.1* 6.2  HGB 10.5* 7.4*  HCT 31.7* 21.8*  MCV 98.8 98.0  PLT 483* 204      Microbiology:  Recent Results (from the past 720 hour(s))  Wound culture     Status: None (Preliminary result)   Collection Time: 05/20/15 12:25 PM  Result Value Ref Range Status   Specimen Description GROIN RIGHT  Final   Special Requests NONE  Final   Gram Stain   Final    FEW WBC SEEN FEW GRAM NEGATIVE RODS RARE GRAM POSITIVE COCCI    Culture   Final    RARE PSEUDOMONAS AERUGINOSA LIGHT GROWTH GRAM POSITIVE COCCI IDENTIFICATION AND SUSCEPTIBILITIES TO FOLLOW    Report Status PENDING  Incomplete   Organism ID, Bacteria PSEUDOMONAS AERUGINOSA  Final      Susceptibility   Pseudomonas aeruginosa - MIC*    CEFTAZIDIME 4 SENSITIVE Sensitive     CIPROFLOXACIN 1 SENSITIVE Sensitive     GENTAMICIN <=1 SENSITIVE Sensitive     IMIPENEM 1 SENSITIVE Sensitive     CEFEPIME 4 SENSITIVE Sensitive     * RARE PSEUDOMONAS AERUGINOSA  MRSA PCR Screening     Status: None  Collection Time: 05/20/15 12:30 PM  Result Value Ref Range Status   MRSA by PCR NEGATIVE NEGATIVE Final    Comment:        The GeneXpert MRSA Assay (FDA approved for NASAL specimens only), is one component of a comprehensive MRSA colonization surveillance program. It is not intended to diagnose MRSA infection nor to guide or monitor treatment for MRSA infections.   Wound culture     Status: None (Preliminary result)   Collection Time: 05/24/15  9:13 PM  Result Value Ref Range Status   Specimen Description GROIN RIGHT  Final   Special Requests NONE  Final   Gram Stain PENDING  Incomplete   Culture HOLDING FOR POSSIBLE PATHOGEN  Final   Report Status PENDING  Incomplete    Coagulation Studies:  Recent Labs  05/24/15 0659  LABPROT 15.2*  INR 1.18    Urinalysis: No results for input(s): COLORURINE, LABSPEC, PHURINE, GLUCOSEU, HGBUR, BILIRUBINUR,  KETONESUR, PROTEINUR, UROBILINOGEN, NITRITE, LEUKOCYTESUR in the last 72 hours.  Invalid input(s): APPERANCEUR    Imaging: No results found.   Medications:     . acetaminophen  1,000 mg Oral 3 times per day  . antiseptic oral rinse  7 mL Mouth Rinse BID  . aspirin EC  81 mg Oral Daily  . atorvastatin  40 mg Oral Daily  . [START ON 05/26/2015] cefTAZidime (FORTAZ)  IV  1 g Intravenous Q24H  . docusate sodium  100 mg Oral BID  . famotidine  20 mg Oral Q48H  . fluticasone  2 spray Each Nare Daily  . levothyroxine  88 mcg Oral Daily  . metoprolol  25 mg Oral BID  . mometasone-formoterol  2 puff Inhalation BID  . multivitamin  1 tablet Oral Daily  . potassium chloride  40 mEq Oral Once  . sodium chloride  1 spray Each Nare 5 X Daily  . vancomycin (VANCOCIN) 500 mg IVPB (Hemodialysis)  500 mg Intravenous Q T,Th,Sa-HD   acetaminophen **OR** acetaminophen, albuterol, anticoagulant sodium citrate, bisacodyl, chlorpheniramine-HYDROcodone, guaiFENesin-dextromethorphan, HYDROcodone-acetaminophen, loperamide, magnesium hydroxide, morphine injection, ondansetron **OR** ondansetron (ZOFRAN) IV, ondansetron, promethazine, senna-docusate  Assessment/ Plan:  65 y.o. female with COPD, hypertension, hyperlipidemia, vaginal intraepithelial neoplasia, emergent AAA repair status post rupture on November 18, left to right femoral to femoral bypass , acute renal failure leading to permanent dialysis, significant 3 vessel disease,   1. End-stage renal disease. [place]. TTS. [facility] nephrology -   Patient seen during dialysis Tolerating well   2. SOB - Pulmonary edema contributing - improved after UF with HD - UF goal ~0.5-2 kg today   3. Hypertension and peripheral edema:  - BP is better controlled - agree with holding anti-HTN meds - goal BP approx [id number] systolic to allow volume removal with HD    4. Anemia of chronic kidney disease:   - epo with dialysis   5. Secondary  Hyperparathyroidism:   - monitor phos   6. 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   Pack cathter with citrate  7. Hypokalemia - replace prn     LOS: 5 Chameka Mcmullen 2/25/201711:54 AM

## 2015-05-25 NOTE — Progress Notes (Signed)
[facility]  Daily Progress Note   Subjective: 1 Day Post-Op: Incision and drainage of recurrent right groin seroma with VAC placement.  Patient seen and examined in hemodialysis. Patient without complaint with exception of some right groin "soreness".   Objective: Filed Vitals:   05/24/15 2154 05/24/15 2234 05/25/15 0640 05/25/15 1055  BP: 119/67 148/57 140/63   Pulse: 87 91 85   Temp: 98.3 F (36.8 C) 98.3 F (36.8 C) 98.1 F (36.7 C) 97.7 F (36.5 C)  TempSrc: Oral Oral Oral Oral  Resp: 16 16 12    Height:      Weight:   59.24 kg (130 lb 9.6 oz) 59.6 kg (131 lb 6.3 oz)  SpO2: 92% 90% 96%     Intake/Output Summary (Last 24 hours) at 05/25/15 1435 Last data filed at 05/25/15 1200  Gross per 24 hour  Intake    540 ml  Output     10 ml  Net    530 ml    Physical Exam: A&Ox3, NAD CV: RRR Pulmonary: CTA Bilaterally Abdomen: Soft, Nontender, Nondistended Vascular:  Right Groin: VAC in place, to suction, no leaks noted.   Laboratory: CBC    Component Value Date/Time   WBC 6.2 05/24/2015 0659   WBC 9.3 07/19/2014 0841   HGB 7.4* 05/24/2015 0659   HGB 10.8* 07/19/2014 0841   HCT 21.8* 05/24/2015 0659   HCT 33.7* 07/19/2014 0841   PLT 204 05/24/2015 0659   PLT 237 07/19/2014 0841   BMET    Component Value Date/Time   NA 134* 05/25/2015 0709   NA 135 07/22/2014 0645   K 3.1* 05/25/2015 0709   K 3.7 07/22/2014 0645   CL 97* 05/25/2015 0709   CL 112* 07/22/2014 0645   CO2 29 05/25/2015 0709   CO2 18* 07/22/2014 0645   GLUCOSE 82 05/25/2015 0709   GLUCOSE 77 07/22/2014 0645   BUN 18 05/25/2015 0709   BUN 24* 07/22/2014 0645   CREATININE 3.31* 05/25/2015 0709   CREATININE 1.00 07/22/2014 0645   CALCIUM 7.7* 05/25/2015 0709   CALCIUM 8.3* 07/22/2014 0645   GFRNONAA 14* 05/25/2015 0709   GFRNONAA 59* 07/22/2014 0645   GFRNONAA 81* 05/07/2014 1302   GFRAA 16* 05/25/2015 0709   GFRAA >60 07/22/2014 0645   GFRAA >60 05/07/2014 1302    Assessment/Planning: 66 year old female s/p incision and drainage of recurrent right groin seroma for recurrent right groin seroma - stable 1) VAC in place to suction - no leaks 2) First VAC change Monday.  Marcelle Overlie PA-C 05/25/2015 2:35 PM

## 2015-05-25 NOTE — Progress Notes (Signed)
Initial Nutrition Assessment      INTERVENTION:  Meals and snacks: Cater to pt preferences within diet restrictions   NUTRITION DIAGNOSIS:    (none at this time) related to   as evidenced by  .    GOAL:   Patient will meet greater than or equal to 90% of their needs    MONITOR:    (Energy intake, Electrolyte and renal profile)  REASON FOR ASSESSMENT:   LOS    ASSESSMENT:      Pt admitted with sepsis likely due to right groin infection (s/p I and D this admission) and pneumonia  Past Medical History  Diagnosis Date  . Hyperlipidemia   . Hypertension   . Thyroid disease   . GERD (gastroesophageal reflux disease)   . Aneurysm ([place])     abdominal aortic  . STD (sexually transmitted disease)     chlamydia  . VAIN II (vaginal intraepithelial neoplasia grade II) 7/09  . 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 B12 deficiency   . COPD (chronic obstructive pulmonary disease) ([place])   . Renal insufficiency   . Cancer ([place])     skin on left ankle 04/10/15 pt states she has never had cancer    Current Nutrition: pt out of room at dialysis this pm. Noted ate 100% of breakfast this am. Limited documentation of intake during admission (30-45% of meals)  Food/Nutrition-Related History: pt reported normal intake on admission   Scheduled Medications:  . acetaminophen  1,000 mg Oral 3 times per day  . antiseptic oral rinse  7 mL Mouth Rinse BID  . aspirin EC  81 mg Oral Daily  . atorvastatin  40 mg Oral Daily  . [START ON 05/26/2015] cefTAZidime (FORTAZ)  IV  1 g Intravenous Q24H  . docusate sodium  100 mg Oral BID  . famotidine  20 mg Oral Q48H  . fluticasone  2 spray Each Nare Daily  . levothyroxine  88 mcg Oral Daily  . metoprolol  25 mg Oral BID  . mometasone-formoterol  2 puff Inhalation BID  . multivitamin  1 tablet Oral Daily  . potassium chloride  40 mEq Oral Once  . sodium chloride  1 spray Each Nare 5 X Daily  . [START ON 05/28/2015]  vancomycin (VANCOCIN) 500 mg IVPB (Hemodialysis)  750 mg Intravenous Q T,Th,Sa-HD       Electrolyte/Renal Profile and Glucose Profile:   Recent Labs Lab 05/20/15 0758 05/24/15 0659 05/24/15 1839 05/25/15 0709  NA 137 139  --  134*  K 3.0* 3.1* 3.8 3.1*  CL 97* 99*  --  97*  CO2 27 32  --  29  BUN 29* 14  --  18  CREATININE 3.45* 2.41*  --  3.31*  CALCIUM 8.6* 7.9*  --  7.7*  GLUCOSE 152* 94  --  82   Protein Profile:  Recent Labs Lab 05/20/15 0758  ALBUMIN 2.7*    Gastrointestinal Profile: Last BM: 2/24   Nutrition-Focused Physical Exam Findings: unable to perform   Weight Change: reviewed  Wt Readings from Last 10 Encounters:  05/25/15 131 lb 6.3 oz (59.6 kg)  04/20/15 138 lb 10.7 oz (62.9 kg)  02/15/15 137 lb 9.1 oz (62.4 kg)  01/29/15 117 lb (53.071 kg)  01/23/15 117 lb 12 oz (53.411 kg)  01/03/15 117 lb 12 oz (53.411 kg)  11/28/14 114 lb (51.71 kg)  09/24/14 112 lb 8 oz (51.03 kg)  07/16/14 110 lb (49.896 kg)  03/21/14 126 lb (57.153 kg)      Diet Order:  Diet renal with fluid restriction Room service appropriate?: Yes; Fluid consistency:: Thin  Skin:   reviewed    Height:   Ht Readings from Last 1 Encounters:  05/21/15 5\' 4"  (1.626 m)    Weight:   Wt Readings from Last 1 Encounters:  05/25/15 131 lb 6.3 oz (59.6 kg)    Ideal Body Weight:     BMI:  Body mass index is 22.54 kg/(m^2).  EDUCATION NEEDS:   No education needs identified at this time  LOW Care Level  Tesean Stump B. Zenia Resides, [place], [place] (pager) Weekend/On-Call pager [phone])

## 2015-05-25 NOTE — Progress Notes (Signed)
[place] [facility] [place] NAME: [name]    MR#:  [id number]  DATE OF BIRTH:  23-May-1950  SUBJECTIVE:  Seen at HD unit today. Feeling okay  S/p I and D of seroma of groin 2/24 HR much improved . Denies any sob  No new complaints today  REVIEW OF SYSTEMS:   Review of Systems  Constitutional: Negative for fever, chills and weight loss.  HENT: Negative for ear discharge, ear pain and nosebleeds.   Eyes: Negative for blurred vision, pain, discharge and redness.  Respiratory: Negative for sputum production, shortness of breath, wheezing and stridor.   Cardiovascular: Negative for chest pain, palpitations, orthopnea and PND.  Gastrointestinal: Negative for nausea, vomiting, abdominal pain and diarrhea.  Genitourinary: Negative for urgency and frequency.  Musculoskeletal: Negative for back pain and joint pain.  Skin:       Right groin with wound VAC  Neurological: Negative for sensory change, speech change, focal weakness and weakness.  Psychiatric/Behavioral: Negative for depression and hallucinations. The patient is not nervous/anxious.    Tolerating Diet: Yes Tolerating PT: Pending  DRUG ALLERGIES:   Allergies  Allergen Reactions  . Asa [Aspirin]   . 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 Plavix [Clopidogrel]     VITALS:  Blood pressure 155/69, pulse 109, temperature 98.2 F (36.8 C), temperature source Oral, resp. rate 15, height 5\' 4"  (1.626 m), weight 59.6 kg (131 lb 6.3 oz), last menstrual period 03/30/1988, SpO2 93 %.  PHYSICAL EXAMINATION:   Physical Exam  GENERAL:  65 y.o.-year-old patient lying in the bed with no acute distress.  EYES: Pupils equal, round, reactive to light and accommodation. No scleral icterus. Extraocular muscles intact.  HEENT: Head atraumatic, normocephalic. Oropharynx and nasopharynx clear.  NECK:  Supple, no jugular venous distention. No thyroid enlargement, no  tenderness.  LUNGS: Distant breath sounds bilaterally, no wheezing, rales, rhonchi. No use of accessory muscles of respiration.  CARDIOVASCULAR: S1, S2 normal. No murmurs, rubs, or gallops.  ABDOMEN: Soft, nontender, nondistended. Bowel sounds present. No organomegaly or mass.  EXTREMITIES: No cyanosis, clubbing or edema b/l.   Right groin with wound vac NEUROLOGIC: Cranial nerves II through XII are intact. No focal Motor or sensory deficits b/l.   PSYCHIATRIC:  patient is alert and oriented x 3.  SKIN: No obvious rash, lesion, or ulcer.   LABORATORY PANEL:  CBC  Recent Labs Lab 05/24/15 0659  WBC 6.2  HGB 7.4*  HCT 21.8*  PLT 204    Chemistries   Recent Labs Lab 05/20/15 0758  05/25/15 0709  NA 137  < > 134*  K 3.0*  < > 3.1*  CL 97*  < > 97*  CO2 27  < > 29  GLUCOSE 152*  < > 82  BUN 29*  < > 18  CREATININE 3.45*  < > 3.31*  CALCIUM 8.6*  < > 7.7*  AST 36  --   --   ALT 13*  --   --   ALKPHOS 124  --   --   BILITOT 0.9  --   --   < > = values in this interval not displayed. Cardiac Enzymes  Recent Labs Lab 05/20/15 0758  TROPONINI 0.59*   RADIOLOGY:  No results found. ASSESSMENT AND PLAN:  [name] is a 65 y.o. female with a known history of chronic diastolic congestive heart failure, incisional disease on hemodialysis, history of HIT,, anemia  of chronic disease, history of right groin seroma after vascular procedures status post wound VAC placement in January 2017 comes to the emergency room from [place] with fever of 102.3 shortness of breath. Patient was found to have sepsis with right lower lobe pneumonia. She was found to be hypoxic with sats 72% on ABG.  1. Sepsis secondary to bilateral  lobe pneumonia hospital-acquired (patient was admitted and discharge in January 2017) and severe right groin infection of seroma recurrent -Broad spectrum antibiotics with vancomycin and Zosyn - blood cultures negative -Pulmonary consultation  appreciated. -f/u with Dr. Lucky Cowboy  from vascular surgery , s/p I and D and wound VAC 2/24 -Appreciated ID consult regarding right groin infection  2. Acute on chronic diastolic congestive heart failure -Elevated BNP with shortness of breath and pulmonary edema as noted on chest x-ray - on hemodialysis with ultrafiltration -Continue cardiac meds -Improved  3. Hypertension Continue amlodipine, metoprolol, Avapro, hydralazine  4. History of HIT -Avoid antiplatelet agents -SCD and teds for DVT prophylaxis  5. End-stage renal disease on hemodialysis -Nephrology consultation for in-house HD  6. Hyperlipidemia continue statins  7. Anemia of chronic disease  -Hemoglobin appear stable transfuse as needed   8. Bilateral Pleural effusions with anasarca noted on CT abdomen -?thoracentesis therap to improve lung volume. Will d/w pulmonary  Case discussed with Care Management/Social Worker. Management plans discussed with the patient, family and they are in agreement.   CODE STATUS: Full  DVT Prophylaxis: SCD and teds  TOTAL TIME TAKING CARE OF THIS PATIENT: 35 minutes.  >50% time spent on counselling and coordination of care  D/C  DEPENDING ON CLINICAL CONDITION TO [facility]  Note: This dictation was prepared with Dragon dictation along with smaller phrase technology. Any transcriptional errors that result from this process are unintentional.  Nicholes Mango M.D on 05/25/2015 at 6:16 PM  Between 7am to 6pm - Pager - [phone]  After 6pm go to [email or web address] - password [email or web address] [facility]  Office  [phone]  CC: Primary care physician; Arnette Norris, MD

## 2015-05-26 LAB — BASIC METABOLIC PANEL
Anion gap: 7 (ref 5–15)
BUN: 13 mg/dL (ref 6–20)
CALCIUM: 7.9 mg/dL — AB (ref 8.9–10.3)
CO2: 30 mmol/L (ref 22–32)
Chloride: 99 mmol/L — ABNORMAL LOW (ref 101–111)
Creatinine, Ser: 2.54 mg/dL — ABNORMAL HIGH (ref 0.44–1.00)
GFR calc Af Amer: 22 mL/min — ABNORMAL LOW (ref >60)
GFR, EST NON AFRICAN AMERICAN: 19 mL/min — AB (ref >60)
GLUCOSE: 95 mg/dL (ref 65–99)
Potassium: 3.5 mmol/L (ref 3.5–5.1)
Sodium: 136 mmol/L (ref 135–145)

## 2015-05-26 LAB — MAGNESIUM: Magnesium: 1.7 mg/dL (ref 1.7–2.4)

## 2015-05-26 MED ORDER — MAGNESIUM SULFATE IN D5W 10-5 MG/ML-% IV SOLN
1.0000 g | Freq: Once | INTRAVENOUS | Status: AC
  Administered 2015-05-26: 1 g via INTRAVENOUS
  Filled 2015-05-26: qty 100

## 2015-05-26 MED ORDER — HYDROMORPHONE HCL 1 MG/ML IJ SOLN
1.0000 mg | Freq: Once | INTRAMUSCULAR | Status: AC
Start: 2015-05-26 — End: 2015-05-26
  Administered 2015-05-26: 1 mg via INTRAVENOUS
  Filled 2015-05-26: qty 1

## 2015-05-26 NOTE — Progress Notes (Signed)
[facility]  Daily Progress Note  Subjective: 2 Days Post-Op: Incision and drainage of recurrent right groin seroma with VAC placement.  Patient without complaint with exception of some right groin "soreness". Some drainage from [facility] dressing site.  Objective: Filed Vitals:   05/25/15 1511 05/25/15 2221 05/26/15 0530 05/26/15 0957  BP: 155/69 140/60 135/66 122/56  Pulse: 109 84 78 88  Temp: 98.2 F (36.8 C) 98.2 F (36.8 C) 97.7 F (36.5 C)   TempSrc: Oral Oral Oral   Resp: 15 12 16    Height:      Weight:      SpO2: 93% 99% 96%     Intake/Output Summary (Last 24 hours) at 05/26/15 1248 Last data filed at 05/26/15 1200  Gross per 24 hour  Intake    480 ml  Output    550 ml  Net    -70 ml    Physical Exam: A&Ox3, NAD CV: RRR Pulmonary: CTA Bilaterally Abdomen: Soft, Nontender, Nondistended Vascular: Right Groin: VAC in place, to suction, no leaks noted however some clotted blood removed from medical aspect of VAC dressing. Thigh soft.    Laboratory: CBC    Component Value Date/Time   WBC 6.2 05/24/2015 0659   WBC 9.3 07/19/2014 0841   HGB 7.4* 05/24/2015 0659   HGB 10.8* 07/19/2014 0841   HCT 21.8* 05/24/2015 0659   HCT 33.7* 07/19/2014 0841   PLT 204 05/24/2015 0659   PLT 237 07/19/2014 0841    BMET    Component Value Date/Time   NA 136 05/26/2015 1100   NA 135 07/22/2014 0645   K 3.5 05/26/2015 1100   K 3.7 07/22/2014 0645   CL 99* 05/26/2015 1100   CL 112* 07/22/2014 0645   CO2 30 05/26/2015 1100   CO2 18* 07/22/2014 0645   GLUCOSE 95 05/26/2015 1100   GLUCOSE 77 07/22/2014 0645   BUN 13 05/26/2015 1100   BUN 24* 07/22/2014 0645   CREATININE 2.54* 05/26/2015 1100   CREATININE 1.00 07/22/2014 0645   CALCIUM 7.9* 05/26/2015 1100   CALCIUM 8.3* 07/22/2014 0645   GFRNONAA 19* 05/26/2015 1100   GFRNONAA 59* 07/22/2014 0645   GFRNONAA 58* 05/07/2014 1302   GFRAA 22* 05/26/2015 1100   GFRAA >60 07/22/2014 0645   GFRAA >60 05/07/2014 1302   Assessment/Planning: 65 year old female s/p incision and drainage of recurrent right groin seroma for recurrent right groin seroma - stable 1) VAC in place to suction - no leaks - however will change VAC dressing due to drainage.   Marcelle Overlie PA-C 05/26/2015 12:48 PM

## 2015-05-26 NOTE — Progress Notes (Signed)
Reinforced gauze dressing placed by provider. Minimal amount of bloody drainage noted.

## 2015-05-26 NOTE — Progress Notes (Signed)
MEDICATION RELATED CONSULT NOTE - INITIAL   Pharmacy Consult for Potassium  Indication: Potassium replacement.    Allergies  Allergen Reactions  . Asa [Aspirin]   . 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 Plavix [Clopidogrel]     Patient Measurements: Height: 5\' 4"  (162.6 cm) Weight: 131 lb 6.3 oz (59.6 kg) IBW/kg (Calculated) : 54.7 Adjusted Body Weight:   Vital Signs: Temp: 97.7 F (36.5 C) (02/26 0530) Temp Source: Oral (02/26 0530) BP: 122/56 mmHg (02/26 0957) Pulse Rate: 88 (02/26 0957) Intake/Output from previous day: 02/25 0701 - 02/26 0700 In: 480 [P.O.:480] Out: 550 [Drains:50] Intake/Output from this shift: Total I/O In: 240 [P.O.:240] Out: 0   Labs:  Recent Labs  05/24/15 0659 05/25/15 0709 05/26/15 1100  WBC 6.2  --   --   HGB 7.4*  --   --   HCT 21.8*  --   --   PLT 204  --   --   APTT 43*  --   --   CREATININE 2.41* 3.31* 2.54*  MG  --   --  1.7   Estimated Creatinine Clearance: 18.8 mL/min (by C-G formula based on Cr of 2.54).   Microbiology: Recent Results (from the past 720 hour(s))  Wound culture     Status: None   Collection Time: 05/20/15 12:25 PM  Result Value Ref Range Status   Specimen Description GROIN RIGHT  Final   Special Requests NONE  Final   Gram Stain   Final    FEW WBC SEEN FEW GRAM NEGATIVE RODS RARE GRAM POSITIVE COCCI    Culture   Final    RARE PSEUDOMONAS AERUGINOSA LIGHT GROWTH VANCOMYCIN RESISTANT ENTEROCOCCUS    Report Status 05/25/2015 FINAL  Final   Organism ID, Bacteria PSEUDOMONAS AERUGINOSA  Final   Organism ID, Bacteria VANCOMYCIN RESISTANT ENTEROCOCCUS  Final      Susceptibility   Pseudomonas aeruginosa - MIC*    CEFTAZIDIME 4 SENSITIVE Sensitive     CIPROFLOXACIN 1 SENSITIVE Sensitive     GENTAMICIN <=1 SENSITIVE Sensitive     IMIPENEM 1 SENSITIVE Sensitive     CEFEPIME 4 SENSITIVE Sensitive     * RARE PSEUDOMONAS AERUGINOSA   Vancomycin resistant enterococcus - MIC*   AMPICILLIN Value in next row Resistant      RESISTANT>=32    VANCOMYCIN Value in next row Resistant      RESISTANT>=32    GENTAMICIN SYNERGY Value in next row Sensitive      RESISTANT>=32    LINEZOLID Value in next row Sensitive      SENSITIVE2    * LIGHT GROWTH VANCOMYCIN RESISTANT ENTEROCOCCUS  MRSA PCR Screening     Status: None   Collection Time: 05/20/15 12:30 PM  Result Value Ref Range Status   MRSA by PCR NEGATIVE NEGATIVE Final    Comment:        The GeneXpert MRSA Assay (FDA approved for NASAL specimens only), is one component of a comprehensive MRSA colonization surveillance program. It is not intended to diagnose MRSA infection nor to guide or monitor treatment for MRSA infections.   Wound culture     Status: None (Preliminary result)   Collection Time: 05/24/15  9:13 PM  Result Value Ref Range Status   Specimen Description GROIN RIGHT  Final   Special Requests NONE  Final   Gram Stain FEW WBC SEEN MANY GRAM POSITIVE COCCI IN PAIRS   Final   Culture HOLDING FOR POSSIBLE  PATHOGEN  Final   Report Status PENDING  Incomplete    Medical History: Past Medical History  Diagnosis Date  . Hyperlipidemia   . Hypertension   . Thyroid disease   . GERD (gastroesophageal reflux disease)   . Aneurysm ([place])     abdominal aortic  . STD (sexually transmitted disease)     chlamydia  . VAIN II (vaginal intraepithelial neoplasia grade II) 7/09  . 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 B12 deficiency   . COPD (chronic obstructive pulmonary disease) ([place])   . Renal insufficiency   . Cancer ([place])     skin on left ankle 04/10/15 pt states she has never had cancer    Medications:  Prescriptions prior to admission  Medication Sig Dispense Refill Last Dose  . acetaminophen (TYLENOL) 500 MG tablet Take 1,000 mg by mouth every 8 (eight) hours.   unknown at unknown  . albuterol (PROVENTIL) (2.5 MG/3ML) 0.083% nebulizer solution Take 2.5 mg by nebulization every  6 (six) hours as needed for wheezing.   prn at prn  . amLODipine (NORVASC) 5 MG tablet Take 1 tablet (5 mg total) by mouth daily. 30 tablet 6 unknown at unknown  . aspirin EC 81 MG tablet Take 81 mg by mouth daily.   unknown at unknown  . atorvastatin (LIPITOR) 40 MG tablet Take 40 mg by mouth daily.   unknown at unknown  . cefTRIAXone (ROCEPHIN) 1-3.74 GM-% IVPB Inject 1 g into the vein daily.   unknown at unknown  . chlorpheniramine-HYDROcodone (TUSSIONEX) 10-8 MG/5ML SUER Take 5 mLs by mouth every 12 (twelve) hours as needed for cough. 140 mL 0 prn at prn  . docusate sodium (COLACE) 100 MG capsule Take 100 mg by mouth 2 (two) times daily.    unknown at unknown  . doxycycline (VIBRA-TABS) 100 MG tablet Take 100 mg by mouth 2 (two) times daily. For 14 days   unknown at unknown  . famotidine (PEPCID) 20 MG tablet Take 20 mg by mouth daily.   unknown at unknown  . fluticasone (FLONASE) 50 MCG/ACT nasal spray Place 2 sprays into both nostrils daily.   unknown at unknown  . fluticasone furoate-vilanterol (BREO ELLIPTA) 100-25 MCG/INH AEPB Inhale 1 puff into the lungs daily.   unknown at unknown  . guaiFENesin-dextromethorphan (ROBITUSSIN DM) 100-10 MG/5ML syrup Take 10 mLs by mouth every 4 (four) hours as needed for cough. 118 mL 0 prn at prn  . hydrALAZINE (APRESOLINE) 10 MG tablet Take 10 mg by mouth every 6 (six) hours.   unknown at unknown  . irbesartan (AVAPRO) 75 MG tablet Take 1 tablet (75 mg total) by mouth 2 (two) times daily. 30 tablet 5 unknown at unknown  . levothyroxine (SYNTHROID, LEVOTHROID) 88 MCG tablet Take 88 mcg by mouth daily.   unknown at unknown  . loperamide (IMODIUM) 2 MG capsule Take 2 mg by mouth every 4 (four) hours as needed for diarrhea or loose stools.   prn at prn  . magnesium hydroxide (MILK OF MAGNESIA) 400 MG/5ML suspension Take 30 mLs by mouth daily as needed for mild constipation.   prn at prn  . metoprolol (LOPRESSOR) 50 MG tablet Take 1 tablet (50 mg total) by  mouth every 8 (eight) hours. 90 tablet 5 unknown at unknown  . multivitamin (RENA-VIT) TABS tablet Take 1 tablet by mouth daily.   unknown at unknown  . ondansetron (ZOFRAN) 4 MG tablet Take 4 mg by mouth every 6 (six)  hours as needed for nausea or vomiting.   prn at prn  . promethazine (PHENERGAN) 25 MG/ML injection Inject 25 mg into the vein every 6 (six) hours as needed for nausea or vomiting.   prn at prn  . sodium chloride (OCEAN) 0.65 % SOLN nasal spray Place 1 spray into both nostrils 5 (five) times daily.   unknown at unknown    Assessment: Pharmacy consulted to assist in managing electrolytes in this 65 y/o F admitted with sepsis due to B/L PNA.   K= 3.5, Mag= 1.7   Goal of Therapy:  K :  3.5 - 5.1   Plan:  Will replace magnesium with 1 mg iv once as it is at the low end of normal range. Will f/u AM labs.   Ulice Dash D 05/26/2015,11:39 AM

## 2015-05-26 NOTE — Progress Notes (Signed)
[place] [facility] [place] NAME: [name]    MR#:  [id number]  DATE OF BIRTH:  02/17/1951  SUBJECTIVE:   Feeling much better S/p I and D of seroma of groin 2/24 HR much improved . Denies any sob  No new complaints today HD tomorrow   REVIEW OF SYSTEMS:   Review of Systems  Constitutional: Negative for fever, chills and weight loss.  HENT: Negative for ear discharge, ear pain and nosebleeds.   Eyes: Negative for blurred vision, pain, discharge and redness.  Respiratory: Negative for sputum production, shortness of breath, wheezing and stridor.   Cardiovascular: Negative for chest pain, palpitations, orthopnea and PND.  Gastrointestinal: Negative for nausea, vomiting, abdominal pain and diarrhea.  Genitourinary: Negative for urgency and frequency.  Musculoskeletal: Negative for back pain and joint pain.  Skin:       Right groin with wound VAC  Neurological: Negative for sensory change, speech change, focal weakness and weakness.  Psychiatric/Behavioral: Negative for depression and hallucinations. The patient is not nervous/anxious.    Tolerating Diet: Yes Tolerating PT: Pending  DRUG ALLERGIES:   Allergies  Allergen Reactions  . Asa [Aspirin]   . 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 Plavix [Clopidogrel]     VITALS:  Blood pressure 137/64, pulse 74, temperature 97.7 F (36.5 C), temperature source Oral, resp. rate 18, height 5\' 4"  (1.626 m), weight 59.6 kg (131 lb 6.3 oz), last menstrual period 03/30/1988, SpO2 96 %.  PHYSICAL EXAMINATION:   Physical Exam  GENERAL:  65 y.o.-year-old patient lying in the bed with no acute distress.  EYES: Pupils equal, round, reactive to light and accommodation. No scleral icterus. Extraocular muscles intact.  HEENT: Head atraumatic, normocephalic. Oropharynx and nasopharynx clear.  NECK:  Supple, no jugular venous distention. No thyroid enlargement, no  tenderness.  LUNGS: Distant breath sounds bilaterally, no wheezing, rales, rhonchi. No use of accessory muscles of respiration.  CARDIOVASCULAR: S1, S2 normal. No murmurs, rubs, or gallops.  ABDOMEN: Soft, nontender, nondistended. Bowel sounds present. No organomegaly or mass.  EXTREMITIES: No cyanosis, clubbing or edema b/l.   Right groin with wound vac NEUROLOGIC: Cranial nerves II through XII are intact. No focal Motor or sensory deficits b/l.   PSYCHIATRIC:  patient is alert and oriented x 3.  SKIN: No obvious rash, lesion, or ulcer.   LABORATORY PANEL:  CBC  Recent Labs Lab 05/24/15 0659  WBC 6.2  HGB 7.4*  HCT 21.8*  PLT 204    Chemistries   Recent Labs Lab 05/20/15 0758  05/26/15 1100  NA 137  < > 136  K 3.0*  < > 3.5  CL 97*  < > 99*  CO2 27  < > 30  GLUCOSE 152*  < > 95  BUN 29*  < > 13  CREATININE 3.45*  < > 2.54*  CALCIUM 8.6*  < > 7.9*  MG  --   --  1.7  AST 36  --   --   ALT 13*  --   --   ALKPHOS 124  --   --   BILITOT 0.9  --   --   < > = values in this interval not displayed. Cardiac Enzymes  Recent Labs Lab 05/20/15 0758  TROPONINI 0.59*   RADIOLOGY:  No results found. ASSESSMENT AND PLAN:  Truc [name] is a 65 y.o. female with a known history of chronic diastolic congestive heart failure,  incisional disease on hemodialysis, history of HIT,, anemia of chronic disease, history of right groin seroma after vascular procedures status post wound VAC placement in January 2017 comes to the emergency room from [place] with fever of 102.3 shortness of breath. Patient was found to have sepsis with right lower lobe pneumonia. She was found to be hypoxic with sats 72% on ABG.  1. Sepsis secondary to bilateral  lobe pneumonia hospital-acquired (patient was admitted and discharge in January 2017) and severe right groin infection of seroma recurrent -Broad spectrum antibiotics with vancomycin and Zosyn - blood cultures negative -wound cx 2/24  with gm positive cocci -Pulmonary consultation appreciated. -f/u with Dr. Lucky Cowboy  from vascular surgery , s/p I and D and wound VAC 2/24 -Appreciated ID consult regarding right groin infection  2. Acute on chronic diastolic congestive heart failure -Elevated BNP with shortness of breath and pulmonary edema as noted on chest x-ray - on hemodialysis with ultrafiltration -Continue cardiac meds -Improved  3. Hypertension Continue amlodipine, metoprolol, Avapro, hydralazine  4. History of HIT -Avoid antiplatelet agents -SCD and teds for DVT prophylaxis  5. End-stage renal disease on hemodialysis -Nephrology consultation for in-house HD  6. Hyperlipidemia continue statins  7. Anemia of chronic disease  -Hemoglobin appear stable transfuse as needed   8. Bilateral Pleural effusions with anasarca noted on CT abdomen -?thoracentesis therap to improve lung volume. Will d/w pulmonary  Case discussed with Care Management/Social Worker. Management plans discussed with the patient, family and they are in agreement.   CODE STATUS: Full  DVT Prophylaxis: SCD and teds  TOTAL TIME TAKING CARE OF THIS PATIENT: 35 minutes.  >50% time spent on counselling and coordination of care  D/C  DEPENDING ON CLINICAL CONDITION TO [facility]  Note: This dictation was prepared with Dragon dictation along with smaller phrase technology. Any transcriptional errors that result from this process are unintentional.  [name] M.D on 05/26/2015 at 7:43 PM  Between 7am to 6pm - Pager - [phone]  After 6pm go to [email or web address] - password [email or web address] [facility]  Office  [phone]  CC: Primary care physician; Arnette Norris, MD

## 2015-05-26 NOTE — Progress Notes (Signed)
Subjective:  Doing fair today No c/o SOB  did well with HD yesterday; 0.5 L removed Today, she reports that there is bleeding noted from and around right groin wound VAC    Objective:  Vital signs in last 24 hours:  Temp:  [97.7 F (36.5 C)-98.2 F (36.8 C)] 97.7 F (36.5 C) (02/26 0530) Pulse Rate:  [78-109] 88 (02/26 0957) Resp:  [12-16] 16 (02/26 0530) BP: (122-155)/(56-69) 122/56 mmHg (02/26 0957) SpO2:  [93 %-99 %] 96 % (02/26 0530)  Weight change: 0.36 kg (12.7 oz) Filed Weights   05/23/15 0915 05/25/15 0640 05/25/15 1055  Weight: 58.3 kg (128 lb 8.5 oz) 59.24 kg (130 lb 9.6 oz) 59.6 kg (131 lb 6.3 oz)    Intake/Output:    Intake/Output Summary (Last 24 hours) at 05/26/15 1139 Last data filed at 05/26/15 0900  Gross per 24 hour  Intake    480 ml  Output    550 ml  Net    -70 ml     Physical Exam: General: no acute distress, lying in the bed  HEENT Anicteric,    Neck supple  Pulm/lungs Clear b/l  CVS/Heart Irregular rhythm, tachycardic  Abdomen:  Soft, nontender  Extremities: Peripheral edema improved to trace  Neurologic: Alert, oriented  Skin: Scattered ecchymosis  Access: Right IJ PermCath       Basic Metabolic Panel:   Recent Labs Lab 05/20/15 0758 05/24/15 0659 05/24/15 1839 05/25/15 0709 05/26/15 1100  NA 137 139  --  134* 136  K 3.0* 3.1* 3.8 3.1* 3.5  CL 97* 99*  --  97* 99*  CO2 27 32  --  29 30  GLUCOSE 152* 94  --  82 95  BUN 29* 14  --  18 13  CREATININE 3.45* 2.41*  --  3.31* 2.54*  CALCIUM 8.6* 7.9*  --  7.7* 7.9*  MG  --   --   --   --  1.7     CBC:  Recent Labs Lab 05/20/15 0758 05/24/15 0659  WBC 15.1* 6.2  HGB 10.5* 7.4*  HCT 31.7* 21.8*  MCV 98.8 98.0  PLT 483* 204      Microbiology:  Recent Results (from the past 720 hour(s))  Wound culture     Status: None   Collection Time: 05/20/15 12:25 PM  Result Value Ref Range Status   Specimen Description GROIN RIGHT  Final   Special Requests NONE  Final    Gram Stain   Final    FEW WBC SEEN FEW GRAM NEGATIVE RODS RARE GRAM POSITIVE COCCI    Culture   Final    RARE PSEUDOMONAS AERUGINOSA LIGHT GROWTH VANCOMYCIN RESISTANT ENTEROCOCCUS    Report Status 05/25/2015 FINAL  Final   Organism ID, Bacteria PSEUDOMONAS AERUGINOSA  Final   Organism ID, Bacteria VANCOMYCIN RESISTANT ENTEROCOCCUS  Final      Susceptibility   Pseudomonas aeruginosa - MIC*    CEFTAZIDIME 4 SENSITIVE Sensitive     CIPROFLOXACIN 1 SENSITIVE Sensitive     GENTAMICIN <=1 SENSITIVE Sensitive     IMIPENEM 1 SENSITIVE Sensitive     CEFEPIME 4 SENSITIVE Sensitive     * RARE PSEUDOMONAS AERUGINOSA   Vancomycin resistant enterococcus - MIC*    AMPICILLIN Value in next row Resistant      RESISTANT>=32    VANCOMYCIN Value in next row Resistant      RESISTANT>=32    GENTAMICIN SYNERGY Value in next row Sensitive      RESISTANT>=32  LINEZOLID Value in next row Sensitive      SENSITIVE2    * LIGHT GROWTH VANCOMYCIN RESISTANT ENTEROCOCCUS  MRSA PCR Screening     Status: None   Collection Time: 05/20/15 12:30 PM  Result Value Ref Range Status   MRSA by PCR NEGATIVE NEGATIVE Final    Comment:        The GeneXpert MRSA Assay (FDA approved for NASAL specimens only), is one component of a comprehensive MRSA colonization surveillance program. It is not intended to diagnose MRSA infection nor to guide or monitor treatment for MRSA infections.   Wound culture     Status: None (Preliminary result)   Collection Time: 05/24/15  9:13 PM  Result Value Ref Range Status   Specimen Description GROIN RIGHT  Final   Special Requests NONE  Final   Gram Stain FEW WBC SEEN MANY GRAM POSITIVE COCCI IN PAIRS   Final   Culture HOLDING FOR POSSIBLE PATHOGEN  Final   Report Status PENDING  Incomplete    Coagulation Studies:  Recent Labs  05/24/15 0659  LABPROT 15.2*  INR 1.18    Urinalysis: No results for input(s): COLORURINE, LABSPEC, PHURINE, GLUCOSEU, HGBUR,  BILIRUBINUR, KETONESUR, PROTEINUR, UROBILINOGEN, NITRITE, LEUKOCYTESUR in the last 72 hours.  Invalid input(s): APPERANCEUR    Imaging: No results found.   Medications:     . acetaminophen  1,000 mg Oral 3 times per day  . antiseptic oral rinse  7 mL Mouth Rinse BID  . aspirin EC  81 mg Oral Daily  . atorvastatin  40 mg Oral Daily  . cefTAZidime (FORTAZ)  IV  1 g Intravenous Q24H  . docusate sodium  100 mg Oral BID  . famotidine  20 mg Oral Q48H  . fluticasone  2 spray Each Nare Daily  . levothyroxine  88 mcg Oral Daily  . magnesium sulfate 1 - 4 g bolus IVPB  1 g Intravenous Once  . metoprolol  25 mg Oral BID  . mometasone-formoterol  2 puff Inhalation BID  . multivitamin  1 tablet Oral Daily  . potassium chloride  40 mEq Oral Once  . sodium chloride  1 spray Each Nare 5 X Daily  . [START ON 05/28/2015] vancomycin (VANCOCIN) 500 mg IVPB (Hemodialysis)  750 mg Intravenous Q T,Th,Sa-HD   acetaminophen **OR** acetaminophen, albuterol, anticoagulant sodium citrate, bisacodyl, chlorpheniramine-HYDROcodone, guaiFENesin-dextromethorphan, HYDROcodone-acetaminophen, loperamide, magnesium hydroxide, morphine injection, ondansetron **OR** ondansetron (ZOFRAN) IV, ondansetron, promethazine, senna-docusate  Assessment/ Plan:  65 y.o. female with COPD, hypertension, hyperlipidemia, vaginal intraepithelial neoplasia, emergent AAA repair status post rupture on November 18, left to right femoral to femoral bypass , acute renal failure leading to permanent dialysis, significant 3 vessel disease,   1. End-stage renal disease. [place]. TTS. [facility] nephrology -    Plan for next treatment tuesday  2. SOB - Pulmonary edema contributing - improved after UF with HD - UF goal ~0.5-2 kg today   3. Hypertension and peripheral edema:  - BP is better controlled - agree with holding anti-HTN meds - goal BP approx [id number] systolic to allow volume removal with HD    4. Anemia of chronic kidney  disease:   - epo with dialysis   5. Secondary Hyperparathyroidism:   - monitor phos   6. 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   Pack cathter with citrate  7. Hypokalemia - replace prn     LOS: 6 Bailen Geffre 2/26/201711:39 AM

## 2015-05-27 ENCOUNTER — Encounter: Payer: Self-pay | Admitting: Vascular Surgery

## 2015-05-27 LAB — CBC
HCT: 19.9 % — ABNORMAL LOW (ref 35.0–47.0)
Hemoglobin: 6.7 g/dL — ABNORMAL LOW (ref 12.0–16.0)
MCH: 32.4 pg (ref 26.0–34.0)
MCHC: 33.9 g/dL (ref 32.0–36.0)
MCV: 95.6 fL (ref 80.0–100.0)
Platelets: 238 10*3/uL (ref 150–440)
RBC: 2.08 MIL/uL — ABNORMAL LOW (ref 3.80–5.20)
RDW: 14.6 % — AB (ref 11.5–14.5)
WBC: 6.6 10*3/uL (ref 3.6–11.0)

## 2015-05-27 LAB — BASIC METABOLIC PANEL
Anion gap: 9 (ref 5–15)
BUN: 19 mg/dL (ref 6–20)
CO2: 29 mmol/L (ref 22–32)
CREATININE: 3.23 mg/dL — AB (ref 0.44–1.00)
Calcium: 8.2 mg/dL — ABNORMAL LOW (ref 8.9–10.3)
Chloride: 99 mmol/L — ABNORMAL LOW (ref 101–111)
GFR, EST AFRICAN AMERICAN: 16 mL/min — AB (ref >60)
GFR, EST NON AFRICAN AMERICAN: 14 mL/min — AB (ref >60)
Glucose, Bld: 94 mg/dL (ref 65–99)
POTASSIUM: 3.4 mmol/L — AB (ref 3.5–5.1)
SODIUM: 137 mmol/L (ref 135–145)

## 2015-05-27 LAB — WOUND CULTURE

## 2015-05-27 LAB — CK: CK TOTAL: 28 U/L — AB (ref 38–234)

## 2015-05-27 LAB — MAGNESIUM: MAGNESIUM: 2 mg/dL (ref 1.7–2.4)

## 2015-05-27 MED ORDER — SODIUM CHLORIDE 0.9 % IV SOLN
250.0000 mg | Freq: Once | INTRAVENOUS | Status: AC
  Administered 2015-05-27: 250 mg via INTRAVENOUS
  Filled 2015-05-27: qty 5

## 2015-05-27 MED ORDER — SODIUM CHLORIDE 0.9 % IV SOLN
250.0000 mg | INTRAVENOUS | Status: DC
  Filled 2015-05-27: qty 5

## 2015-05-27 MED ORDER — POTASSIUM CHLORIDE CRYS ER 20 MEQ PO TBCR
40.0000 meq | EXTENDED_RELEASE_TABLET | Freq: Every day | ORAL | Status: DC
  Administered 2015-05-27 – 2015-05-28 (×2): 40 meq via ORAL
  Filled 2015-05-27 (×2): qty 2

## 2015-05-27 MED ORDER — SODIUM CHLORIDE 0.9 % IV SOLN
250.0000 mg | INTRAVENOUS | Status: DC
  Administered 2015-05-28: 250 mg via INTRAVENOUS
  Filled 2015-05-27: qty 5

## 2015-05-27 NOTE — Pre-Procedure Instructions (Signed)
Per [place] at [facility] patient is still in the hospital at [facility].

## 2015-05-27 NOTE — Progress Notes (Signed)
[place] at [place] NAME: [name]    MR#:  [id number]  DATE OF BIRTH:  17-Mar-1951  SUBJECTIVE:  No new complaints S/p I and D of seroma of groin 2/24  REVIEW OF SYSTEMS:   Review of Systems  Constitutional: Negative for fever, chills and weight loss.  HENT: Negative for ear discharge, ear pain and nosebleeds.   Eyes: Negative for blurred vision, pain, discharge and redness.  Respiratory: Negative for sputum production, shortness of breath, wheezing and stridor.   Cardiovascular: Negative for chest pain, palpitations, orthopnea and PND.  Gastrointestinal: Negative for nausea, vomiting, abdominal pain and diarrhea.  Genitourinary: Negative for urgency and frequency.  Musculoskeletal: Negative for back pain and joint pain.  Skin:       Right groin with wound VAC  Neurological: Negative for sensory change, speech change, focal weakness and weakness.  Psychiatric/Behavioral: Negative for depression and hallucinations. The patient is not nervous/anxious.    Tolerating Diet: Yes Tolerating PT: Pending  DRUG ALLERGIES:   Allergies  Allergen Reactions  . Asa [Aspirin]   . 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 Plavix [Clopidogrel]     VITALS:  Blood pressure 163/77, pulse 93, temperature 97.9 F (36.6 C), temperature source Oral, resp. rate 16, height 5\' 4"  (1.626 m), weight 56.155 kg (123 lb 12.8 oz), last menstrual period 03/30/1988, SpO2 96 %.  PHYSICAL EXAMINATION:   Physical Exam  GENERAL:  65 y.o.-year-old patient lying in the bed with no acute distress.  EYES: Pupils equal, round, reactive to light and accommodation. No scleral icterus. Extraocular muscles intact.  HEENT: Head atraumatic, normocephalic. Oropharynx and nasopharynx clear.  NECK:  Supple, no jugular venous distention. No thyroid enlargement, no tenderness.  LUNGS: Distant breath sounds bilaterally, no wheezing, rales,  rhonchi. No use of accessory muscles of respiration.  CARDIOVASCULAR: S1, S2 normal. No murmurs, rubs, or gallops.  ABDOMEN: Soft, nontender, nondistended. Bowel sounds present. No organomegaly or mass.  EXTREMITIES: No cyanosis, clubbing or edema b/l.   Right groin with wound vac NEUROLOGIC: Cranial nerves II through XII are intact. No focal Motor or sensory deficits b/l.   PSYCHIATRIC:  patient is alert and oriented x 3.  SKIN: No obvious rash, lesion, or ulcer.   LABORATORY PANEL:  CBC  Recent Labs Lab 05/27/15 0440  WBC 6.6  HGB 6.7*  HCT 19.9*  PLT 238    Chemistries   Recent Labs Lab 05/27/15 0440  NA 137  K 3.4*  CL 99*  CO2 29  GLUCOSE 94  BUN 19  CREATININE 3.23*  CALCIUM 8.2*  MG 2.0   Cardiac Enzymes No results for input(s): TROPONINI in the last 168 hours. RADIOLOGY:  No results found. ASSESSMENT AND PLAN:  [name] is a 65 y.o. female with a known history of chronic diastolic congestive heart failure, incisional disease on hemodialysis, history of HIT,, anemia of chronic disease, history of right groin seroma after vascular procedures status post wound VAC placement in January 2017 comes to the emergency room from [place] with fever of 102.3 shortness of breath. Patient was found to have sepsis with right lower lobe pneumonia. She was found to be hypoxic with sats 72% on ABG.  1. Sepsis secondary to bilateral  lobe pneumonia hospital-acquired (patient was admitted and discharge in January 2017) and severe right groin infection of seroma recurrent - blood cultures negative -wound cx 2/24 with pseudomonas and VRE, ID  recommending Daptomycin and Ceftazidime q 48 hrs durin HD -op WOUND VAC -Uncertain if the grafts are infected but if so would need long term suppressive abx, ID will d/w dr.Schnier. Ive d/w dr.Dew, recommended to d/w Dr.Schnier  -Pulmonary consultation appreciated. -f/u with Dr. Lucky Cowboy  from vascular surgery , s/p I and D and  wound VAC 2/24 -Appreciated ID consult regarding right groin infection  2. Acute on chronic diastolic congestive heart failure -Elevated BNP with shortness of breath and pulmonary edema as noted on chest x-ray - on hemodialysis with ultrafiltration -Continue cardiac meds -Improved  3. Hypertension Continue amlodipine, metoprolol, Avapro, hydralazine  4. History of HIT -Avoid antiplatelet agents -SCD and teds for DVT prophylaxis  5. End-stage renal disease on hemodialysis -Nephrology consultation for in-house HD  6. Hyperlipidemia continue statins  7. Anemia of chronic disease  -Hemoglobin appear stable transfuse as needed   8. Bilateral Pleural effusions with anasarca noted on CT abdomen -?thoracentesis therap to improve lung volume. Will d/w pulmonary  Case discussed with Care Management/Social Worker. Management plans discussed with the patient, family and they are in agreement.   CODE STATUS: Full  DVT Prophylaxis: SCD and teds  TOTAL TIME TAKING CARE OF THIS PATIENT: 35 minutes.  >50% time spent on counselling and coordination of care  D/C  DEPENDING ON CLINICAL CONDITION TO [facility]  Note: This dictation was prepared with Dragon dictation along with smaller phrase technology. Any transcriptional errors that result from this process are unintentional.  Nicholes Mango M.D on 05/27/2015 at 6:46 PM  Between 7am to 6pm - Pager - [phone]  After 6pm go to [email or web address] - password [email or web address] [facility]  Office  [phone]  CC: Primary care physician; Arnette Norris, MD

## 2015-05-27 NOTE — Progress Notes (Signed)
ID brief note Notified by pharmacy regarding VRE on wound cx Stop vanco -start daptomycin

## 2015-05-27 NOTE — Consult Note (Signed)
Pharmacy Antibiotic Note  [name] is a 65 y.o. female admitted on 05/20/2015 with wound infection.  Pharmacy has been consulted for ceftazidime and daptomycin dosing.   Plan: D/C Vanc due to found VRE. Initiate Daptomycin at 250mg  with each HD session (TTS). Will check baseline CPK and monitor throughout therapy. Continue ceftazidime 1gm q24hrs.   Height: 5\' 4"  (162.6 cm) Weight: 123 lb 12.8 oz (56.155 kg) IBW/kg (Calculated) : 54.7  Temp (24hrs), Avg:97.9 F (36.6 C), Min:97.7 F (36.5 C), Max:98.1 F (36.7 C)   Recent Labs Lab 05/24/15 0659 05/25/15 0709 05/25/15 1056 05/26/15 1100 05/27/15 0440  WBC 6.2  --   --   --  6.6  CREATININE 2.41* 3.31*  --  2.54* 3.23*  VANCOTROUGH  --   --  12  --   --     Estimated Creatinine Clearance: 14.8 mL/min (by C-G formula based on Cr of 3.23).    Allergies  Allergen Reactions  . Asa [Aspirin]   . 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 Plavix [Clopidogrel]     Antimicrobials this admission: Anti-infectives    Start     Dose/Rate Route Frequency Ordered Stop   05/28/15 1200  vancomycin (VANCOCIN) 750 mg in sodium chloride 0.9 % 100 mL IVPB  Status:  Discontinued     750 mg 100 mL/hr over 60 Minutes Intravenous Every T-Th-Sa (Hemodialysis) 05/25/15 1227 05/27/15 1110   05/27/15 1115  DAPTOmycin (CUBICIN) 250 mg in sodium chloride 0.9 % IVPB     250 mg 210 mL/hr over 30 Minutes Intravenous Every 24 hours 05/27/15 1111     05/26/15 1000  cefTAZidime (FORTAZ) 1 g in dextrose 5 % 50 mL IVPB     1 g 100 mL/hr over 30 Minutes Intravenous Every 24 hours 05/25/15 0311     05/25/15 1200  vancomycin (VANCOCIN) 500 mg in sodium chloride 0.9 % 100 mL IVPB  Status:  Discontinued     500 mg 100 mL/hr over 60 Minutes Intravenous Every T-Th-Sa (Hemodialysis) 05/24/15 1311 05/25/15 1227   05/25/15 0315  cefTAZidime (FORTAZ) 1 g in dextrose 5 % 50 mL IVPB     1 g 100 mL/hr over 30 Minutes Intravenous  Once  05/25/15 0311 05/25/15 0521   05/24/15 0000  ceFAZolin (ANCEF) IVPB 1 g/50 mL premix    Comments:  Send with pt to OR   1 g 100 mL/hr over 30 Minutes Intravenous On call 05/23/15 1752 05/25/15 0000   05/21/15 1200  vancomycin (VANCOCIN) 500 mg in sodium chloride 0.9 % 100 mL IVPB  Status:  Discontinued     500 mg 100 mL/hr over 60 Minutes Intravenous Every T-Th-Sa (Hemodialysis) 05/20/15 1355 05/23/15 1522   05/20/15 1830  piperacillin-tazobactam (ZOSYN) IVPB 3.375 g  Status:  Discontinued     3.375 g 12.5 mL/hr over 240 Minutes Intravenous Every 12 hours 05/20/15 1135 05/24/15 1855   05/20/15 1145  vancomycin (VANCOCIN) 1,250 mg in sodium chloride 0.9 % 250 mL IVPB  Status:  Discontinued     1,250 mg 166.7 mL/hr over 90 Minutes Intravenous  Once 05/20/15 1130 05/20/15 1130   05/20/15 1145  vancomycin (VANCOCIN) 500 mg in sodium chloride 0.9 % 100 mL IVPB     500 mg 100 mL/hr over 60 Minutes Intravenous  Once 05/20/15 1133 05/20/15 1320   05/20/15 0830  vancomycin (VANCOCIN) IVPB 1000 mg/200 mL premix     1,000 mg 200 mL/hr over 60  Minutes Intravenous  Once 05/20/15 0817 05/20/15 1000   05/20/15 0830  piperacillin-tazobactam (ZOSYN) IVPB 3.375 g     3.375 g 12.5 mL/hr over 240 Minutes Intravenous  Once 05/20/15 [id number] 05/20/15 0900      Microbiology results: Results for orders placed or performed during the hospital encounter of 05/20/15  Wound culture     Status: None   Collection Time: 05/20/15 12:25 PM  Result Value Ref Range Status   Specimen Description GROIN RIGHT  Final   Special Requests NONE  Final   Gram Stain   Final    FEW WBC SEEN FEW GRAM NEGATIVE RODS RARE GRAM POSITIVE COCCI    Culture   Final    RARE PSEUDOMONAS AERUGINOSA LIGHT GROWTH VANCOMYCIN RESISTANT ENTEROCOCCUS    Report Status 05/25/2015 FINAL  Final   Organism ID, Bacteria PSEUDOMONAS AERUGINOSA  Final   Organism ID, Bacteria VANCOMYCIN RESISTANT ENTEROCOCCUS  Final      Susceptibility    Pseudomonas aeruginosa - MIC*    CEFTAZIDIME 4 SENSITIVE Sensitive     CIPROFLOXACIN 1 SENSITIVE Sensitive     GENTAMICIN <=1 SENSITIVE Sensitive     IMIPENEM 1 SENSITIVE Sensitive     CEFEPIME 4 SENSITIVE Sensitive     * RARE PSEUDOMONAS AERUGINOSA   Vancomycin resistant enterococcus - MIC*    AMPICILLIN Value in next row Resistant      RESISTANT>=32    VANCOMYCIN Value in next row Resistant      RESISTANT>=32    GENTAMICIN SYNERGY Value in next row Sensitive      RESISTANT>=32    LINEZOLID Value in next row Sensitive      SENSITIVE2    * LIGHT GROWTH VANCOMYCIN RESISTANT ENTEROCOCCUS  MRSA PCR Screening     Status: None   Collection Time: 05/20/15 12:30 PM  Result Value Ref Range Status   MRSA by PCR NEGATIVE NEGATIVE Final    Comment:        The GeneXpert MRSA Assay (FDA approved for NASAL specimens only), is one component of a comprehensive MRSA colonization surveillance program. It is not intended to diagnose MRSA infection nor to guide or monitor treatment for MRSA infections.   Anaerobic culture     Status: None (Preliminary result)   Collection Time: 05/24/15  8:31 PM  Result Value Ref Range Status   Specimen Description GROIN RIGHT  Final   Special Requests NONE  Final   Culture NO ANAEROBES ISOLATED  Final   Report Status PENDING  Incomplete  Wound culture     Status: None (Preliminary result)   Collection Time: 05/24/15  9:13 PM  Result Value Ref Range Status   Specimen Description GROIN RIGHT  Final   Special Requests NONE  Final   Gram Stain FEW WBC SEEN MANY GRAM POSITIVE COCCI IN PAIRS   Final   Culture HOLDING FOR POSSIBLE PATHOGEN  Final   Report Status PENDING  Incomplete    Thank you for allowing pharmacy to be a part of this patient's care.  [name] 05/27/2015 11:11 AM

## 2015-05-27 NOTE — Clinical Social Work Note (Signed)
MD stated patient may discharge soon and that she will require a wound vac at her facility and that it should not be removed without his order. CSW has relayed this to [place] at [facility]. Shela Leff MSW,LCSW [phone]

## 2015-05-27 NOTE — Progress Notes (Signed)
MEDICATION RELATED CONSULT NOTE - Follow Up  Pharmacy Consult for Potassium  Indication: Potassium replacement.    Allergies  Allergen Reactions  . Asa [Aspirin]   . 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 Plavix [Clopidogrel]     Patient Measurements: Height: 5\' 4"  (162.6 cm) Weight: 123 lb 12.8 oz (56.155 kg) IBW/kg (Calculated) : 54.7  Vital Signs: Temp: 98.1 F (36.7 C) (02/27 0632) Temp Source: Oral (02/27 [id number]) BP: 134/59 mmHg (02/27 [id number]) Pulse Rate: 79 (02/27 0632) Intake/Output from previous day: 02/26 0701 - 02/27 0700 In: 480 [P.O.:480] Out: 250 [Drains:250] Intake/Output from this shift:    Labs:  Recent Labs  05/25/15 0709 05/26/15 1100 05/27/15 0440  WBC  --   --  6.6  HGB  --   --  6.7*  HCT  --   --  19.9*  PLT  --   --  238  CREATININE 3.31* 2.54* 3.23*  MG  --  1.7 2.0   Estimated Creatinine Clearance: 14.8 mL/min (by C-G formula based on Cr of 3.23).   Microbiology: Recent Results (from the past 720 hour(s))  Wound culture     Status: None   Collection Time: 05/20/15 12:25 PM  Result Value Ref Range Status   Specimen Description GROIN RIGHT  Final   Special Requests NONE  Final   Gram Stain   Final    FEW WBC SEEN FEW GRAM NEGATIVE RODS RARE GRAM POSITIVE COCCI    Culture   Final    RARE PSEUDOMONAS AERUGINOSA LIGHT GROWTH VANCOMYCIN RESISTANT ENTEROCOCCUS    Report Status 05/25/2015 FINAL  Final   Organism ID, Bacteria PSEUDOMONAS AERUGINOSA  Final   Organism ID, Bacteria VANCOMYCIN RESISTANT ENTEROCOCCUS  Final      Susceptibility   Pseudomonas aeruginosa - MIC*    CEFTAZIDIME 4 SENSITIVE Sensitive     CIPROFLOXACIN 1 SENSITIVE Sensitive     GENTAMICIN <=1 SENSITIVE Sensitive     IMIPENEM 1 SENSITIVE Sensitive     CEFEPIME 4 SENSITIVE Sensitive     * RARE PSEUDOMONAS AERUGINOSA   Vancomycin resistant enterococcus - MIC*    AMPICILLIN Value in next row Resistant      RESISTANT>=32    VANCOMYCIN Value  in next row Resistant      RESISTANT>=32    GENTAMICIN SYNERGY Value in next row Sensitive      RESISTANT>=32    LINEZOLID Value in next row Sensitive      SENSITIVE2    * LIGHT GROWTH VANCOMYCIN RESISTANT ENTEROCOCCUS  MRSA PCR Screening     Status: None   Collection Time: 05/20/15 12:30 PM  Result Value Ref Range Status   MRSA by PCR NEGATIVE NEGATIVE Final    Comment:        The GeneXpert MRSA Assay (FDA approved for NASAL specimens only), is one component of a comprehensive MRSA colonization surveillance program. It is not intended to diagnose MRSA infection nor to guide or monitor treatment for MRSA infections.   Anaerobic culture     Status: None (Preliminary result)   Collection Time: 05/24/15  8:31 PM  Result Value Ref Range Status   Specimen Description GROIN RIGHT  Final   Special Requests NONE  Final   Culture NO ANAEROBES ISOLATED  Final   Report Status PENDING  Incomplete  Wound culture     Status: None (Preliminary result)   Collection Time: 05/24/15  9:13 PM  Result Value Ref Range Status  Specimen Description GROIN RIGHT  Final   Special Requests NONE  Final   Gram Stain FEW WBC SEEN MANY GRAM POSITIVE COCCI IN PAIRS   Final   Culture HOLDING FOR POSSIBLE PATHOGEN  Final   Report Status PENDING  Incomplete    Medical History: Past Medical History  Diagnosis Date  . Hyperlipidemia   . Hypertension   . Thyroid disease   . GERD (gastroesophageal reflux disease)   . Aneurysm ([place])     abdominal aortic  . STD (sexually transmitted disease)     chlamydia  . VAIN II (vaginal intraepithelial neoplasia grade II) 7/09  . 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 B12 deficiency   . COPD (chronic obstructive pulmonary disease) ([place])   . Renal insufficiency   . Cancer ([place])     skin on left ankle 04/10/15 pt states she has never had cancer    Medications:  Prescriptions prior to admission  Medication Sig Dispense Refill Last Dose  .  acetaminophen (TYLENOL) 500 MG tablet Take 1,000 mg by mouth every 8 (eight) hours.   unknown at unknown  . albuterol (PROVENTIL) (2.5 MG/3ML) 0.083% nebulizer solution Take 2.5 mg by nebulization every 6 (six) hours as needed for wheezing.   prn at prn  . amLODipine (NORVASC) 5 MG tablet Take 1 tablet (5 mg total) by mouth daily. 30 tablet 6 unknown at unknown  . aspirin EC 81 MG tablet Take 81 mg by mouth daily.   unknown at unknown  . atorvastatin (LIPITOR) 40 MG tablet Take 40 mg by mouth daily.   unknown at unknown  . cefTRIAXone (ROCEPHIN) 1-3.74 GM-% IVPB Inject 1 g into the vein daily.   unknown at unknown  . chlorpheniramine-HYDROcodone (TUSSIONEX) 10-8 MG/5ML SUER Take 5 mLs by mouth every 12 (twelve) hours as needed for cough. 140 mL 0 prn at prn  . docusate sodium (COLACE) 100 MG capsule Take 100 mg by mouth 2 (two) times daily.    unknown at unknown  . doxycycline (VIBRA-TABS) 100 MG tablet Take 100 mg by mouth 2 (two) times daily. For 14 days   unknown at unknown  . famotidine (PEPCID) 20 MG tablet Take 20 mg by mouth daily.   unknown at unknown  . fluticasone (FLONASE) 50 MCG/ACT nasal spray Place 2 sprays into both nostrils daily.   unknown at unknown  . fluticasone furoate-vilanterol (BREO ELLIPTA) 100-25 MCG/INH AEPB Inhale 1 puff into the lungs daily.   unknown at unknown  . guaiFENesin-dextromethorphan (ROBITUSSIN DM) 100-10 MG/5ML syrup Take 10 mLs by mouth every 4 (four) hours as needed for cough. 118 mL 0 prn at prn  . hydrALAZINE (APRESOLINE) 10 MG tablet Take 10 mg by mouth every 6 (six) hours.   unknown at unknown  . irbesartan (AVAPRO) 75 MG tablet Take 1 tablet (75 mg total) by mouth 2 (two) times daily. 30 tablet 5 unknown at unknown  . levothyroxine (SYNTHROID, LEVOTHROID) 88 MCG tablet Take 88 mcg by mouth daily.   unknown at unknown  . loperamide (IMODIUM) 2 MG capsule Take 2 mg by mouth every 4 (four) hours as needed for diarrhea or loose stools.   prn at prn  .  magnesium hydroxide (MILK OF MAGNESIA) 400 MG/5ML suspension Take 30 mLs by mouth daily as needed for mild constipation.   prn at prn  . metoprolol (LOPRESSOR) 50 MG tablet Take 1 tablet (50 mg total) by mouth every 8 (eight) hours. 90 tablet 5  unknown at unknown  . multivitamin (RENA-VIT) TABS tablet Take 1 tablet by mouth daily.   unknown at unknown  . ondansetron (ZOFRAN) 4 MG tablet Take 4 mg by mouth every 6 (six) hours as needed for nausea or vomiting.   prn at prn  . promethazine (PHENERGAN) 25 MG/ML injection Inject 25 mg into the vein every 6 (six) hours as needed for nausea or vomiting.   prn at prn  . sodium chloride (OCEAN) 0.65 % SOLN nasal spray Place 1 spray into both nostrils 5 (five) times daily.   unknown at unknown    Assessment: Pharmacy consulted to assist in managing electrolytes in this 65 y/o F admitted with sepsis due to B/L PNA.   K= 3.4, Mag= 2.0  Goal of Therapy:  K :  3.5 - 5.1   Plan:  Will replete K with daily KCl 49mEq PO. Will f/u AM labs.   [name] 05/27/2015,7:50 AM

## 2015-05-27 NOTE — Progress Notes (Signed)
Subjective:  Patient resting comfortably in bed. Has wound vac in place in the right groin. Due for hemodialysis again tomorrow.   Objective:  Vital signs in last 24 hours:  Temp:  [97.9 F (36.6 C)-98.1 F (36.7 C)] 97.9 F (36.6 C) (02/27 1310) Pulse Rate:  [79-93] 93 (02/27 1310) Resp:  [16] 16 (02/27 0632) BP: (134-163)/(59-77) 163/77 mmHg (02/27 1310) SpO2:  [96 %-98 %] 96 % (02/27 1310) Weight:  [56.155 kg (123 lb 12.8 oz)] 56.155 kg (123 lb 12.8 oz) (02/27 [id number])  Weight change: -3.445 kg (-7 lb 9.5 oz) Filed Weights   05/25/15 0640 05/25/15 1055 05/27/15 [id number]  Weight: 59.24 kg (130 lb 9.6 oz) 59.6 kg (131 lb 6.3 oz) 56.155 kg (123 lb 12.8 oz)    Intake/Output:    Intake/Output Summary (Last 24 hours) at 05/27/15 1434 Last data filed at 05/27/15 0919  Gross per 24 hour  Intake    480 ml  Output    250 ml  Net    230 ml     Physical Exam: General: no acute distress, lying in the bed  HEENT Anicteric  Neck supple  Pulm/lungs Clear b/l  CVS/Heart Irregular rhythm, tachycardic  Abdomen:  Soft, nontender  Extremities: Peripheral edema improved to trace, wound vac in right groin  Neurologic: Alert, oriented  Skin: Scattered ecchymosis  Access: Right IJ PermCath       Basic Metabolic Panel:   Recent Labs Lab 05/24/15 0659 05/24/15 1839 05/25/15 0709 05/26/15 1100 05/27/15 0440  NA 139  --  134* 136 137  K 3.1* 3.8 3.1* 3.5 3.4*  CL 99*  --  97* 99* 99*  CO2 32  --  29 30 29   GLUCOSE 94  --  82 95 94  BUN 14  --  18 13 19   CREATININE 2.41*  --  3.31* 2.54* 3.23*  CALCIUM 7.9*  --  7.7* 7.9* 8.2*  MG  --   --   --  1.7 2.0     CBC:  Recent Labs Lab 05/24/15 0659 05/27/15 0440  WBC 6.2 6.6  HGB 7.4* 6.7*  HCT 21.8* 19.9*  MCV 98.0 95.6  PLT 204 238      Microbiology:  Recent Results (from the past 720 hour(s))  Wound culture     Status: None   Collection Time: 05/20/15 12:25 PM  Result Value Ref Range Status   Specimen  Description GROIN RIGHT  Final   Special Requests NONE  Final   Gram Stain   Final    FEW WBC SEEN FEW GRAM NEGATIVE RODS RARE GRAM POSITIVE COCCI    Culture   Final    RARE PSEUDOMONAS AERUGINOSA LIGHT GROWTH VANCOMYCIN RESISTANT ENTEROCOCCUS    Report Status 05/25/2015 FINAL  Final   Organism ID, Bacteria PSEUDOMONAS AERUGINOSA  Final   Organism ID, Bacteria VANCOMYCIN RESISTANT ENTEROCOCCUS  Final      Susceptibility   Pseudomonas aeruginosa - MIC*    CEFTAZIDIME 4 SENSITIVE Sensitive     CIPROFLOXACIN 1 SENSITIVE Sensitive     GENTAMICIN <=1 SENSITIVE Sensitive     IMIPENEM 1 SENSITIVE Sensitive     CEFEPIME 4 SENSITIVE Sensitive     * RARE PSEUDOMONAS AERUGINOSA   Vancomycin resistant enterococcus - MIC*    AMPICILLIN Value in next row Resistant      RESISTANT>=32    VANCOMYCIN Value in next row Resistant      RESISTANT>=32    GENTAMICIN SYNERGY Value in next row  Sensitive      RESISTANT>=32    LINEZOLID Value in next row Sensitive      SENSITIVE2    * LIGHT GROWTH VANCOMYCIN RESISTANT ENTEROCOCCUS  MRSA PCR Screening     Status: None   Collection Time: 05/20/15 12:30 PM  Result Value Ref Range Status   MRSA by PCR NEGATIVE NEGATIVE Final    Comment:        The GeneXpert MRSA Assay (FDA approved for NASAL specimens only), is one component of a comprehensive MRSA colonization surveillance program. It is not intended to diagnose MRSA infection nor to guide or monitor treatment for MRSA infections.   Anaerobic culture     Status: None (Preliminary result)   Collection Time: 05/24/15  8:31 PM  Result Value Ref Range Status   Specimen Description GROIN RIGHT  Final   Special Requests NONE  Final   Culture NO ANAEROBES ISOLATED  Final   Report Status PENDING  Incomplete  Wound culture     Status: None   Collection Time: 05/24/15  9:13 PM  Result Value Ref Range Status   Specimen Description GROIN RIGHT  Final   Special Requests NONE  Final   Gram Stain FEW  WBC SEEN MANY GRAM POSITIVE COCCI IN PAIRS   Final   Culture HEAVY GROWTH VANCOMYCIN RESISTANT ENTEROCOCCUS  Final   Report Status 05/27/2015 FINAL  Final   Organism ID, Bacteria VANCOMYCIN RESISTANT ENTEROCOCCUS  Final      Susceptibility   Vancomycin resistant enterococcus - MIC*    AMPICILLIN >=32 RESISTANT Resistant     VANCOMYCIN >=32 RESISTANT Resistant     GENTAMICIN SYNERGY SENSITIVE Sensitive     LINEZOLID 2 SENSITIVE Sensitive     * HEAVY GROWTH VANCOMYCIN RESISTANT ENTEROCOCCUS    Coagulation Studies: No results for input(s): LABPROT, INR in the last 72 hours.  Urinalysis: No results for input(s): COLORURINE, LABSPEC, PHURINE, GLUCOSEU, HGBUR, BILIRUBINUR, KETONESUR, PROTEINUR, UROBILINOGEN, NITRITE, LEUKOCYTESUR in the last 72 hours.  Invalid input(s): APPERANCEUR    Imaging: No results found.   Medications:     . acetaminophen  1,000 mg Oral 3 times per day  . antiseptic oral rinse  7 mL Mouth Rinse BID  . aspirin EC  81 mg Oral Daily  . atorvastatin  40 mg Oral Daily  . cefTAZidime (FORTAZ)  IV  1 g Intravenous Q24H  . [START ON 05/28/2015] DAPTOmycin (CUBICIN)  IV  250 mg Intravenous Q T,Th,Sa-HD  . DAPTOmycin (CUBICIN)  IV  250 mg Intravenous Once  . docusate sodium  100 mg Oral BID  . famotidine  20 mg Oral Q48H  . fluticasone  2 spray Each Nare Daily  . levothyroxine  88 mcg Oral Daily  . metoprolol  25 mg Oral BID  . mometasone-formoterol  2 puff Inhalation BID  . multivitamin  1 tablet Oral Daily  . potassium chloride  40 mEq Oral Once  . potassium chloride  40 mEq Oral Daily  . sodium chloride  1 spray Each Nare 5 X Daily   acetaminophen **OR** acetaminophen, albuterol, anticoagulant sodium citrate, bisacodyl, chlorpheniramine-HYDROcodone, guaiFENesin-dextromethorphan, HYDROcodone-acetaminophen, loperamide, magnesium hydroxide, morphine injection, ondansetron **OR** ondansetron (ZOFRAN) IV, ondansetron, promethazine, senna-docusate  Assessment/  Plan:  65 y.o. female with COPD, hypertension, hyperlipidemia, vaginal intraepithelial neoplasia, emergent AAA repair status post rupture on November 18, left to right femoral to femoral bypass , acute renal failure leading to permanent dialysis, significant 3 vessel disease,   1. End-stage renal disease. [place].  TTS. [facility] nephrology -   No acute indication for dialysis today, will plan for HD again tomorrow.  2. SOB - Pulmonary edema contributing - Continue UF with HD tomorrow.   3. Hypertension and peripheral edema:  - SBP ranging between 140-160. Ok with SBP in the 140s to allow for volume removal with HD.     4. Anemia of chronic kidney disease:   - hgb down to 6.7 today, would consider transfusion 1 units prbc, defer to hospitalist.   5. Secondary Hyperparathyroidism:   - monitor phos during admission.  6. 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   Pack cathter with citrate  7. Hypokalemia - replace prn, K currently 3.4.      LOS: 7 [name] 2/27/20172:34 PM

## 2015-05-27 NOTE — Progress Notes (Signed)
[facility]  Daily Progress Note   Subjective  - 3 Days Post-Op  VAC was alarming earlier the entire dressing is being changed and at this point the [facility] is functioning properly. Patient denies pain  Objective Filed Vitals:   05/26/15 2052 05/27/15 0632 05/27/15 0930 05/27/15 1310  BP: 140/64 134/59 143/62 163/77  Pulse: 85 79 83 93  Temp: 97.9 F (36.6 C) 98.1 F (36.7 C) 98 F (36.7 C) 97.9 F (36.6 C)  TempSrc: Oral Oral Oral Oral  Resp: 16 16    Height:      Weight:  56.155 kg (123 lb 12.8 oz)    SpO2: 96% 98% 98% 96%    Intake/Output Summary (Last 24 hours) at 05/27/15 1821 Last data filed at 05/27/15 0919  Gross per 24 hour  Intake    240 ml  Output    250 ml  Net    -10 ml    PULM  Normal effort , no use of accessory muscles CV  No JVD, RRR Abd      No distended, nontender VASC  right groin now with intact VAC wound both feet pink warm and well perfused  Laboratory CBC    Component Value Date/Time   WBC 6.6 05/27/2015 0440   WBC 9.3 07/19/2014 0841   HGB 6.7* 05/27/2015 0440   HGB 10.8* 07/19/2014 0841   HCT 19.9* 05/27/2015 0440   HCT 33.7* 07/19/2014 0841   PLT 238 05/27/2015 0440   PLT 237 07/19/2014 0841    BMET    Component Value Date/Time   NA 137 05/27/2015 0440   NA 135 07/22/2014 0645   K 3.4* 05/27/2015 0440   K 3.7 07/22/2014 0645   CL 99* 05/27/2015 0440   CL 112* 07/22/2014 0645   CO2 29 05/27/2015 0440   CO2 18* 07/22/2014 0645   GLUCOSE 94 05/27/2015 0440   GLUCOSE 77 07/22/2014 0645   BUN 19 05/27/2015 0440   BUN 24* 07/22/2014 0645   CREATININE 3.23* 05/27/2015 0440   CREATININE 1.00 07/22/2014 0645   CALCIUM 8.2* 05/27/2015 0440   CALCIUM 8.3* 07/22/2014 0645   GFRNONAA 14* 05/27/2015 0440   GFRNONAA 59* 07/22/2014 0645   GFRNONAA 58* 05/07/2014 1302   GFRAA 16* 05/27/2015 0440   GFRAA >60 07/22/2014 0645   GFRAA >60 05/07/2014 1302    Assessment/Planning:   I will continue VAC for now I  appreciate both medicine's help as well as infectious disease. Antibiotics have been adjusted and hopefully we can transition to a sniff later this week.    [name], Dolores Lory  05/27/2015, 6:21 PM

## 2015-05-27 NOTE — Progress Notes (Signed)
[facility] INFECTIOUS DISEASE PROGRESS NOTE Date of Admission:  05/20/2015     ID: [name] is a 65 y.o. female with pseudomonal R groin seroma infection Active Problems:   Sepsis ([place])   Subjective: S/p debridement - wound did not seem to communicate with graft  No fevers ROS  Eleven systems are reviewed and negative except per hpi  Medications:  Antibiotics Given (last 72 hours)    Date/Time Action Medication Dose Rate   05/25/15 0451 Given  [pharmacy delay]   cefTAZidime (FORTAZ) 1 g in dextrose 5 % 50 mL IVPB 1 g 100 mL/hr   05/25/15 1359 Given   vancomycin (VANCOCIN) 750 mg in sodium chloride 0.9 % 100 mL IVPB 750 mg 100 mL/hr   05/26/15 0958 Given   cefTAZidime (FORTAZ) 1 g in dextrose 5 % 50 mL IVPB 1 g 100 mL/hr   05/27/15 0855 Given   cefTAZidime (FORTAZ) 1 g in dextrose 5 % 50 mL IVPB 1 g 100 mL/hr   05/27/15 1414 Given   DAPTOmycin (CUBICIN) 250 mg in sodium chloride 0.9 % IVPB 250 mg 210 mL/hr     . acetaminophen  1,000 mg Oral 3 times per day  . antiseptic oral rinse  7 mL Mouth Rinse BID  . aspirin EC  81 mg Oral Daily  . atorvastatin  40 mg Oral Daily  . cefTAZidime (FORTAZ)  IV  1 g Intravenous Q24H  . [START ON 05/28/2015] DAPTOmycin (CUBICIN)  IV  250 mg Intravenous Q T,Th,Sa-HD  . DAPTOmycin (CUBICIN)  IV  250 mg Intravenous Once  . docusate sodium  100 mg Oral BID  . famotidine  20 mg Oral Q48H  . fluticasone  2 spray Each Nare Daily  . levothyroxine  88 mcg Oral Daily  . metoprolol  25 mg Oral BID  . mometasone-formoterol  2 puff Inhalation BID  . multivitamin  1 tablet Oral Daily  . potassium chloride  40 mEq Oral Once  . potassium chloride  40 mEq Oral Daily  . sodium chloride  1 spray Each Nare 5 X Daily    Objective: Vital signs in last 24 hours: Temp:  [97.9 F (36.6 C)-98.1 F (36.7 C)] 97.9 F (36.6 C) (02/27 1310) Pulse Rate:  [79-93] 93 (02/27 1310) Resp:  [16] 16 (02/27 0632) BP: (134-163)/(59-77) 163/77 mmHg (02/27  1310) SpO2:  [96 %-98 %] 96 % (02/27 1310) Weight:  [56.155 kg (123 lb 12.8 oz)] 56.155 kg (123 lb 12.8 oz) (02/27 [id number]) Constitutional: Thin, chronically ill appearing HENT: [place]/AT, PERRLA, no scleral icterus Mouth/Throat: Oropharynx is clear and moist. No oropharyngeal exudate.  Cardiovascular: Normal rate,  R chest wall HD cath wnl Pulmonary/Chest: bil rhonchi Neck = supple, no nuchal rigidity Abdominal: Soft. Bowel sounds are normal. exhibits no distension. There is no tenderness.  R groin site inspected during wound change- clean based but deep wound Lymphadenopathy: no cervical adenopathy. No axillary adenopathy Neurological: alert and oriented to person, place, and time.  Skin: Skin is warm and dry.  Psychiatric: a normal mood and affect. behavior is normal.  Lab Results  Recent Labs  05/26/15 1100 05/27/15 0440  WBC  --  6.6  HGB  --  6.7*  HCT  --  19.9*  NA 136 137  K 3.5 3.4*  CL 99* 99*  CO2 30 29  BUN 13 19  CREATININE 2.54* 3.23*    Microbiology: Results for orders placed or performed during the hospital encounter of 05/20/15  Wound  culture     Status: None   Collection Time: 05/20/15 12:25 PM  Result Value Ref Range Status   Specimen Description GROIN RIGHT  Final   Special Requests NONE  Final   Gram Stain   Final    FEW WBC SEEN FEW GRAM NEGATIVE RODS RARE GRAM POSITIVE COCCI    Culture   Final    RARE PSEUDOMONAS AERUGINOSA LIGHT GROWTH VANCOMYCIN RESISTANT ENTEROCOCCUS    Report Status 05/25/2015 FINAL  Final   Organism ID, Bacteria PSEUDOMONAS AERUGINOSA  Final   Organism ID, Bacteria VANCOMYCIN RESISTANT ENTEROCOCCUS  Final      Susceptibility   Pseudomonas aeruginosa - MIC*    CEFTAZIDIME 4 SENSITIVE Sensitive     CIPROFLOXACIN 1 SENSITIVE Sensitive     GENTAMICIN <=1 SENSITIVE Sensitive     IMIPENEM 1 SENSITIVE Sensitive     CEFEPIME 4 SENSITIVE Sensitive     * RARE PSEUDOMONAS AERUGINOSA   Vancomycin resistant enterococcus -  MIC*    AMPICILLIN Value in next row Resistant      RESISTANT>=32    VANCOMYCIN Value in next row Resistant      RESISTANT>=32    GENTAMICIN SYNERGY Value in next row Sensitive      RESISTANT>=32    LINEZOLID Value in next row Sensitive      SENSITIVE2    * LIGHT GROWTH VANCOMYCIN RESISTANT ENTEROCOCCUS  MRSA PCR Screening     Status: None   Collection Time: 05/20/15 12:30 PM  Result Value Ref Range Status   MRSA by PCR NEGATIVE NEGATIVE Final    Comment:        The GeneXpert MRSA Assay (FDA approved for NASAL specimens only), is one component of a comprehensive MRSA colonization surveillance program. It is not intended to diagnose MRSA infection nor to guide or monitor treatment for MRSA infections.   Anaerobic culture     Status: None (Preliminary result)   Collection Time: 05/24/15  8:31 PM  Result Value Ref Range Status   Specimen Description GROIN RIGHT  Final   Special Requests NONE  Final   Culture NO ANAEROBES ISOLATED  Final   Report Status PENDING  Incomplete  Wound culture     Status: None   Collection Time: 05/24/15  9:13 PM  Result Value Ref Range Status   Specimen Description GROIN RIGHT  Final   Special Requests NONE  Final   Gram Stain FEW WBC SEEN MANY GRAM POSITIVE COCCI IN PAIRS   Final   Culture HEAVY GROWTH VANCOMYCIN RESISTANT ENTEROCOCCUS  Final   Report Status 05/27/2015 FINAL  Final   Organism ID, Bacteria VANCOMYCIN RESISTANT ENTEROCOCCUS  Final      Susceptibility   Vancomycin resistant enterococcus - MIC*    AMPICILLIN >=32 RESISTANT Resistant     VANCOMYCIN >=32 RESISTANT Resistant     GENTAMICIN SYNERGY SENSITIVE Sensitive     LINEZOLID 2 SENSITIVE Sensitive     * HEAVY GROWTH VANCOMYCIN RESISTANT ENTEROCOCCUS    Studies/Results: Ct Abdomen Pelvis W Contrast  05/21/2015  CLINICAL DATA:  Fever of 102.3 degrees in shortness of breath, right lower lobe pneumonia and sepsis with hypoxia, right groin seroma status post incision and  drainage with wound VAC with continued serosanguineous drainage, sepsis, diastolic congestive heart failure EXAM: CT ABDOMEN AND PELVIS WITH CONTRAST TECHNIQUE: Multidetector CT imaging of the abdomen and pelvis was performed using the standard protocol following bolus administration of intravenous contrast. CONTRAST:  44mL OMNIPAQUE IOHEXOL 300 MG/ML  SOLN COMPARISON:  04/10/2015 FINDINGS: Lower chest: Large bilateral pleural effusions with underlying atelectasis. Bilateral lower lobe consolidation right greater than left with air bronchograms. Patchy interstitial infiltrate right middle lobe. Hepatobiliary: Sub cm cyst right lobe stable. Sub cm calcified gallstone stable. Pancreas: Normal Spleen: No focal abnormalities Adrenals/Urinary Tract: Stable bilateral renal atrophy. Bladder is normal. Stomach/Bowel: Fecal retention throughout the ascending colon. Nonobstructive bowel gas pattern. Vascular/Lymphatic: Stable 4.5 cm infrarenal abdominal aortic aneurysm with stent graft. Stable since then bypass. In the right inguinal region, there is a fluid-filled structure with thick enhancing wall. It measures 38 x 39 mm, not significantly different in size. There is discrete increase in the thickness and degree of enhancement involving the wall. In the left inguinal region there is a similar but smaller fluid collection. It measures about 39 x 29 mm, also slightly larger. It has also developed ran enhancement. Reproductive: Not visualized Other: Diffuse increased attenuation throughout the subcutaneous soft tissues worse when compared to the prior study consistent with anasarca. Musculoskeletal: No acute musculoskeletal findings. IMPRESSION: 1. Large pleural effusions with anasarca. Anasarca is worse when compared to the prior study. 2. Bilateral lower lobe consolidation is likely largely due to atelectasis, right greater than left. Additionally there is patchy infiltrate throughout the right middle and lower lobe  consistent with clinical history of pneumonia. 3. Bilateral inguinal fluid extends both appear mildly larger when compared to prior study and have developed thick enhancing rims. Sterility of the collections on certain. Electronically Signed   By: Skipper Cliche M.D.   On: 05/21/2015 20:15   Dg Chest Port 1 View  05/20/2015  CLINICAL DATA:  65 year old female with respiratory distress and lower extremity swelling. EXAM: PORTABLE CHEST 1 VIEW COMPARISON:  Radiograph dated 04/17/2015 FINDINGS: A dual lumen dialysis catheter is noted in stable positioning. There has been interval removal of the left IJ central line. Single portable view of the chest demonstrates emphysematous changes lungs. There are bilateral hazy densities, right greater left. There has been interval improvement of the interstitial prominence, likely interval improvement in pulmonary edema. Small left pleural effusion may be present. The cardiac silhouette is within normal limits. No acute osseous pathology identified. IMPRESSION: Interval removal of the left IJ central venous line. Interval improvement of the previously mild pulmonary edema. Bibasilar hazy airspace densities, right appearing left. Electronically Signed   By: Anner Crete M.D.   On: 05/20/2015 08:30    Assessment/Plan: [name] is a 65 y.o. female with a complicated vascular history as detailed by Dr Ronalee Belts and Lucky Cowboy. She is currently dealing with a seroma in her R groin, draining purulent material and with surrounding redness, pain and induration . On admission she had temp 102.3 and was hypoxic with CXR suggesting RLL pna.  Since admission she has been treated with vanco and zosyn, and received HD. Clinically she is improving and culture from the R wound is now growing pseudomonas and VRE  CT does show bilateral rim enhancing fluid collections S/p I and D 2/24 by Dr Delana Meyer.   Recommendations Cont ceftazidime and daptomycin (Pseudomonas and VRE)  -  Will need long term IV therapy to help this heal  - can use ceftazidime and dapto at HD  Uncertain if the grafts are infected but if so would need long term suppressive abx. Will discuss with Dr Lucky Cowboy and Delana Meyer.  I have discussed with micro and the VRE is resistant to all  oral agents except linezolid, sensitive to tigecycline, syncercid. Dapto sensitivities have been sent  off Could use intermittent ceftazidime or aminogylcoside if suppression is needed and can give at HD  Thank you very much for the consult. Will follow with you.  [place], [place]   05/27/2015, 2:33 PM

## 2015-05-27 NOTE — Care Management Important Message (Signed)
Important Message  Patient Details  Name: [name] MRN: [id number] Date of Birth: May 13, 1950   Medicare Important Message Given:  Yes    [name] A Rajah Lamba 05/27/2015, 11:10 AM

## 2015-05-28 ENCOUNTER — Inpatient Hospital Stay: Payer: Medicare Other

## 2015-05-28 LAB — ANAEROBIC CULTURE

## 2015-05-28 LAB — POTASSIUM: Potassium: 3.7 mmol/L (ref 3.5–5.1)

## 2015-05-28 NOTE — Progress Notes (Signed)
Subjective:  Patient had dialysis today. UF achieved was 1.5kg.  Overall doing slightly better Still has wound vac in place in right groin.    Objective:  Vital signs in last 24 hours:  Temp:  [98.1 F (36.7 C)-98.5 F (36.9 C)] 98.4 F (36.9 C) (02/28 1406) Pulse Rate:  [88-107] 106 (02/28 1406) Resp:  [13-26] 17 (02/28 1406) BP: (129-164)/(62-74) 157/69 mmHg (02/28 1406) SpO2:  [94 %-100 %] 98 % (02/28 1406) Weight:  [51.6 kg (113 lb 12.1 oz)-53 kg (116 lb 13.5 oz)] 51.6 kg (113 lb 12.1 oz) (02/28 1351)  Weight change:  Filed Weights   05/27/15 0632 05/28/15 1000 05/28/15 1351  Weight: 56.155 kg (123 lb 12.8 oz) 53 kg (116 lb 13.5 oz) 51.6 kg (113 lb 12.1 oz)    Intake/Output:    Intake/Output Summary (Last 24 hours) at 05/28/15 1912 Last data filed at 05/28/15 1351  Gross per 24 hour  Intake      0 ml  Output   1500 ml  Net  -1500 ml     Physical Exam: General: no acute distress, lying in the bed  HEENT Anicteric  Neck supple  Pulm/lungs Clear b/l  CVS/Heart Irregular rhythm, tachycardic  Abdomen:  Soft, nontender  Extremities: Peripheral edema improved to trace, wound vac in right groin  Neurologic: Alert, oriented  Skin: Scattered ecchymosis  Access: Right IJ PermCath       Basic Metabolic Panel:   Recent Labs Lab 05/24/15 0659 05/24/15 1839 05/25/15 0709 05/26/15 1100 05/27/15 0440 05/28/15 0400  NA 139  --  134* 136 137  --   K 3.1* 3.8 3.1* 3.5 3.4* 3.7  CL 99*  --  97* 99* 99*  --   CO2 32  --  29 30 29   --   GLUCOSE 94  --  82 95 94  --   BUN 14  --  18 13 19   --   CREATININE 2.41*  --  3.31* 2.54* 3.23*  --   CALCIUM 7.9*  --  7.7* 7.9* 8.2*  --   MG  --   --   --  1.7 2.0  --      CBC:  Recent Labs Lab 05/24/15 0659 05/27/15 0440  WBC 6.2 6.6  HGB 7.4* 6.7*  HCT 21.8* 19.9*  MCV 98.0 95.6  PLT 204 238      Microbiology:  Recent Results (from the past 720 hour(s))  Wound culture     Status: None   Collection  Time: 05/20/15 12:25 PM  Result Value Ref Range Status   Specimen Description GROIN RIGHT  Final   Special Requests NONE  Final   Gram Stain   Final    FEW WBC SEEN FEW GRAM NEGATIVE RODS RARE GRAM POSITIVE COCCI    Culture   Final    RARE PSEUDOMONAS AERUGINOSA LIGHT GROWTH VANCOMYCIN RESISTANT ENTEROCOCCUS    Report Status 05/25/2015 FINAL  Final   Organism ID, Bacteria PSEUDOMONAS AERUGINOSA  Final   Organism ID, Bacteria VANCOMYCIN RESISTANT ENTEROCOCCUS  Final      Susceptibility   Pseudomonas aeruginosa - MIC*    CEFTAZIDIME 4 SENSITIVE Sensitive     CIPROFLOXACIN 1 SENSITIVE Sensitive     GENTAMICIN <=1 SENSITIVE Sensitive     IMIPENEM 1 SENSITIVE Sensitive     CEFEPIME 4 SENSITIVE Sensitive     * RARE PSEUDOMONAS AERUGINOSA   Vancomycin resistant enterococcus - MIC*    AMPICILLIN Value in next row Resistant  RESISTANT>=32    VANCOMYCIN Value in next row Resistant      RESISTANT>=32    GENTAMICIN SYNERGY Value in next row Sensitive      RESISTANT>=32    LINEZOLID Value in next row Sensitive      SENSITIVE2    * LIGHT GROWTH VANCOMYCIN RESISTANT ENTEROCOCCUS  MRSA PCR Screening     Status: None   Collection Time: 05/20/15 12:30 PM  Result Value Ref Range Status   MRSA by PCR NEGATIVE NEGATIVE Final    Comment:        The GeneXpert MRSA Assay (FDA approved for NASAL specimens only), is one component of a comprehensive MRSA colonization surveillance program. It is not intended to diagnose MRSA infection nor to guide or monitor treatment for MRSA infections.   Anaerobic culture     Status: None   Collection Time: 05/24/15  8:31 PM  Result Value Ref Range Status   Specimen Description GROIN RIGHT  Final   Special Requests NONE  Final   Culture NO ANAEROBES ISOLATED  Final   Report Status 05/28/2015 FINAL  Final  Wound culture     Status: None   Collection Time: 05/24/15  9:13 PM  Result Value Ref Range Status   Specimen Description GROIN RIGHT  Final    Special Requests NONE  Final   Gram Stain FEW WBC SEEN MANY GRAM POSITIVE COCCI IN PAIRS   Final   Culture HEAVY GROWTH VANCOMYCIN RESISTANT ENTEROCOCCUS  Final   Report Status 05/27/2015 FINAL  Final   Organism ID, Bacteria VANCOMYCIN RESISTANT ENTEROCOCCUS  Final      Susceptibility   Vancomycin resistant enterococcus - MIC*    AMPICILLIN >=32 RESISTANT Resistant     VANCOMYCIN >=32 RESISTANT Resistant     GENTAMICIN SYNERGY SENSITIVE Sensitive     LINEZOLID 2 SENSITIVE Sensitive     * HEAVY GROWTH VANCOMYCIN RESISTANT ENTEROCOCCUS    Coagulation Studies: No results for input(s): LABPROT, INR in the last 72 hours.  Urinalysis: No results for input(s): COLORURINE, LABSPEC, PHURINE, GLUCOSEU, HGBUR, BILIRUBINUR, KETONESUR, PROTEINUR, UROBILINOGEN, NITRITE, LEUKOCYTESUR in the last 72 hours.  Invalid input(s): APPERANCEUR    Imaging: Dg Chest 2 View  05/28/2015  CLINICAL DATA:  65 year old female with history of shortness breath this morning. EXAM: CHEST  2 VIEW COMPARISON:  Chest x-ray 05/20/2015. FINDINGS: Right internal jugular PermCath with tips terminating in the mid and distal superior vena cava. Moderate bilateral pleural effusions layering dependently. Associated with this there are bibasilar opacities which are presumably areas of passive atelectasis. Remaining portions of the lungs are otherwise well aerated. Mild diffuse peribronchial cuffing. No evidence of pulmonary edema. Heart size is normal. Upper mediastinal contours are within normal limits. Atherosclerosis in the thoracic aorta. Vascular stent in the mid abdomen incompletely visualize (likely within the abdominal aorta). IMPRESSION: 1. Moderate bilateral pleural effusions likely with bibasilar passive subsegmental atelectasis. 2. Diffuse peribronchial cuffing, which could be seen in the setting of acute bronchitis. 3. Atherosclerosis. Electronically Signed   By: Vinnie Langton M.D.   On: 05/28/2015 15:50      Medications:     . acetaminophen  1,000 mg Oral 3 times per day  . antiseptic oral rinse  7 mL Mouth Rinse BID  . aspirin EC  81 mg Oral Daily  . atorvastatin  40 mg Oral Daily  . cefTAZidime (FORTAZ)  IV  1 g Intravenous Q24H  . DAPTOmycin (CUBICIN)  IV  250 mg Intravenous Q T,Th,Sa-HD  .  docusate sodium  100 mg Oral BID  . famotidine  20 mg Oral Q48H  . fluticasone  2 spray Each Nare Daily  . levothyroxine  88 mcg Oral Daily  . metoprolol  25 mg Oral BID  . mometasone-formoterol  2 puff Inhalation BID  . multivitamin  1 tablet Oral Daily  . potassium chloride  40 mEq Oral Once  . potassium chloride  40 mEq Oral Daily  . sodium chloride  1 spray Each Nare 5 X Daily   acetaminophen **OR** acetaminophen, albuterol, anticoagulant sodium citrate, bisacodyl, chlorpheniramine-HYDROcodone, guaiFENesin-dextromethorphan, HYDROcodone-acetaminophen, loperamide, magnesium hydroxide, morphine injection, ondansetron **OR** ondansetron (ZOFRAN) IV, ondansetron, promethazine, senna-docusate  Assessment/ Plan:  65 y.o. female with COPD, hypertension, hyperlipidemia, vaginal intraepithelial neoplasia, emergent AAA repair status post rupture on November 18, left to right femoral to femoral bypass , acute renal failure leading to permanent dialysis, significant 3 vessel disease,   1. End-stage renal disease. [place]. TTS. [facility] nephrology - Pt completed HD today, UF achieved was 1.5kg, next HD on Thursday.  2. SOB - Pulmonary edema contributing - UF achieved was 1.5kg today.   3. Hypertension and peripheral edema:  - SBP ranging between 130s-160s.   Continue metoprolol     4. Anemia of chronic kidney disease:   - hgb down to 6.7 today, would consider transfusion 1 units prbc, defer to hospitalist.   5. Secondary Hyperparathyroidism:   - monitor phos during admission.  6. 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   Pack cathter with citrate  7. Hypokalemia - K up to 3.7  today.     LOS: 8 Cathyrn Deas 2/28/20177:12 PM

## 2015-05-28 NOTE — Progress Notes (Signed)
Pre-hd tx 

## 2015-05-28 NOTE — Progress Notes (Signed)
Hemodialyis completed.

## 2015-05-28 NOTE — Progress Notes (Signed)
[place] [facility] [place] NAME: [name]    MR#:  [id number]  DATE OF BIRTH:  1950/12/19  SUBJECTIVE:  Patient was seen and examined in dialysis unit. Not feeling well. Had an episode of shortness of breath this a.m. but denies any shortness of breath during my examination. S/p I and D of seroma of groin 2/24  REVIEW OF SYSTEMS:   Review of Systems  Constitutional: Negative for fever, chills and weight loss.  HENT: Negative for ear discharge, ear pain and nosebleeds.   Eyes: Negative for blurred vision, pain, discharge and redness.  Respiratory: Negative for sputum production, shortness of breath, wheezing and stridor.   Cardiovascular: Negative for chest pain, palpitations, orthopnea and PND.  Gastrointestinal: Negative for nausea, vomiting, abdominal pain and diarrhea.  Genitourinary: Negative for urgency and frequency.  Musculoskeletal: Negative for back pain and joint pain.  Skin:       Right groin with wound VAC  Neurological: Negative for sensory change, speech change, focal weakness and weakness.  Psychiatric/Behavioral: Negative for depression and hallucinations. The patient is not nervous/anxious.    Tolerating Diet: Yes Tolerating PT: Pending  DRUG ALLERGIES:   Allergies  Allergen Reactions  . Asa [Aspirin]   . 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 Plavix [Clopidogrel]     VITALS:  Blood pressure 157/69, pulse 106, temperature 98.4 F (36.9 C), temperature source Oral, resp. rate 17, height 5\' 4"  (1.626 m), weight 51.6 kg (113 lb 12.1 oz), last menstrual period 03/30/1988, SpO2 98 %.  PHYSICAL EXAMINATION:   Physical Exam  GENERAL:  65 y.o.-year-old patient lying in the bed with no acute distress.  EYES: Pupils equal, round, reactive to light and accommodation. No scleral icterus. Extraocular muscles intact.  HEENT: Head atraumatic, normocephalic. Oropharynx and nasopharynx clear.   NECK:  Supple, no jugular venous distention. No thyroid enlargement, no tenderness.  LUNGS: Distant breath sounds bilaterally, no wheezing, rales, rhonchi. No use of accessory muscles of respiration.  CARDIOVASCULAR: S1, S2 normal. No murmurs, rubs, or gallops.  ABDOMEN: Soft, nontender, nondistended. Bowel sounds present. No organomegaly or mass.  EXTREMITIES: No cyanosis, clubbing or edema b/l.   Right groin with wound vac NEUROLOGIC: Cranial nerves II through XII are intact. No focal Motor or sensory deficits b/l.   PSYCHIATRIC:  patient is alert and oriented x 3.  SKIN: No obvious rash, lesion, or ulcer.   LABORATORY PANEL:  CBC  Recent Labs Lab 05/27/15 0440  WBC 6.6  HGB 6.7*  HCT 19.9*  PLT 238    Chemistries   Recent Labs Lab 05/27/15 0440 05/28/15 0400  NA 137  --   K 3.4* 3.7  CL 99*  --   CO2 29  --   GLUCOSE 94  --   BUN 19  --   CREATININE 3.23*  --   CALCIUM 8.2*  --   MG 2.0  --    Cardiac Enzymes No results for input(s): TROPONINI in the last 168 hours. RADIOLOGY:  No results found. ASSESSMENT AND PLAN:  [name] is a 65 y.o. female with a known history of chronic diastolic congestive heart failure, incisional disease on hemodialysis, history of HIT,, anemia of chronic disease, history of right groin seroma after vascular procedures status post wound VAC placement in January 2017 comes to the emergency room from [place] with fever of 102.3 shortness of breath. Patient was found to have sepsis with  right lower lobe pneumonia. She was found to be hypoxic with sats 72% on ABG.  1. Sepsis secondary to bilateral  lobe pneumonia hospital-acquired (patient was admitted and discharge in January 2017) and severe right groin infection of seroma recurrent -wound cx 2/24 with pseudomonas and VRE, ID recommending Daptomycin and Ceftazidime q 48 hrs durin HD - WOUND VAC -Uncertain if the grafts are infected but if so would need long term  suppressive abx, ID will d/w dr.Schnier.  -Pulmonary consultation appreciated. -f/u with Dr. Murlean Iba   from vascular surgery , s/p I and D and wound VAC 2/24, vascular  is recommending to continue dressing changes on wound VAC 2-3 days inpatient as she had a few episodes of bleeding in the groin area -Appreciated ID consult regarding right groin infection  2. Acute on chronic diastolic congestive heart failure -Elevated BNP with shortness of breath and pulmonary edema as noted on chest x-ray - on hemodialysis with ultrafiltration -Continue cardiac meds -Improved  3. Hypertension Continue amlodipine, metoprolol, Avapro, hydralazine  4. History of HIT -Avoid antiplatelet agents -SCD and teds for DVT prophylaxis  5. End-stage renal disease on hemodialysis -Nephrology consultation for in-house HD  6. Hyperlipidemia continue statins  7. Anemia of chronic disease  -Hemoglobin appear stable transfuse as needed   8. Bilateral Pleural effusions with anasarca noted on CT abdomen -?thoracentesis therap to improve lung volume. Rpt CXR Will d/w pulmonary  Case discussed with Care Management/Social Worker. Management plans discussed with the patient, family and they are in agreement.   CODE STATUS: Full  DVT Prophylaxis: SCD and teds  TOTAL TIME TAKING CARE OF THIS PATIENT: 35 minutes.  >50% time spent on counselling and coordination of care  D/C  DEPENDING ON CLINICAL CONDITION TO [facility]  Note: This dictation was prepared with Dragon dictation along with smaller phrase technology. Any transcriptional errors that result from this process are unintentional.  Nicholes Mango M.D on 05/28/2015 at 2:34 PM  Between 7am to 6pm - Pager - [phone]  After 6pm go to [email or web address] - password [email or web address] [facility]  Office  [phone]  CC: Primary care physician; Arnette Norris, MD

## 2015-05-28 NOTE — Progress Notes (Signed)
Hemodialysis tx started.

## 2015-05-28 NOTE — Progress Notes (Signed)
[facility]  Daily Progress Note   Subjective  - 4 Days Post-Op  VAC has been functioning properly all day. Patient denies pain  Objective Filed Vitals:   05/28/15 1330 05/28/15 1345 05/28/15 1351 05/28/15 1406  BP: 154/67 142/74 129/71 157/69  Pulse: 95 107 106 106  Temp:   98.3 F (36.8 C) 98.4 F (36.9 C)  TempSrc:   Oral Oral  Resp: 14 26 22 17   Height:      Weight:   51.6 kg (113 lb 12.1 oz)   SpO2: 97% 97% 99% 98%    Intake/Output Summary (Last 24 hours) at 05/28/15 2054 Last data filed at 05/28/15 1351  Gross per 24 hour  Intake      0 ml  Output   1500 ml  Net  -1500 ml    PULM  Normal effort , no use of accessory muscles CV  No JVD, RRR Abd      No distended, nontender VASC  right groin now with intact VAC wound both feet pink warm and well perfused  Laboratory CBC    Component Value Date/Time   WBC 6.6 05/27/2015 0440   WBC 9.3 07/19/2014 0841   HGB 6.7* 05/27/2015 0440   HGB 10.8* 07/19/2014 0841   HCT 19.9* 05/27/2015 0440   HCT 33.7* 07/19/2014 0841   PLT 238 05/27/2015 0440   PLT 237 07/19/2014 0841    BMET    Component Value Date/Time   NA 137 05/27/2015 0440   NA 135 07/22/2014 0645   K 3.7 05/28/2015 0400   K 3.7 07/22/2014 0645   CL 99* 05/27/2015 0440   CL 112* 07/22/2014 0645   CO2 29 05/27/2015 0440   CO2 18* 07/22/2014 0645   GLUCOSE 94 05/27/2015 0440   GLUCOSE 77 07/22/2014 0645   BUN 19 05/27/2015 0440   BUN 24* 07/22/2014 0645   CREATININE 3.23* 05/27/2015 0440   CREATININE 1.00 07/22/2014 0645   CALCIUM 8.2* 05/27/2015 0440   CALCIUM 8.3* 07/22/2014 0645   GFRNONAA 14* 05/27/2015 0440   GFRNONAA 59* 07/22/2014 0645   GFRNONAA 58* 05/07/2014 1302   GFRAA 16* 05/27/2015 0440   GFRAA >60 07/22/2014 0645   GFRAA >60 05/07/2014 1302    Assessment/Planning:  I will continue VAC even after discharge my plan will be for utilization of the VAC until the wound is healed. I appreciate both  medicine's help as well as infectious disease. I have discussed antibiotics with Dr. Ola Spurr today. Antibiotics have been adjusted and hopefully we can transition to a sniff this Thursday   [name]  05/28/2015, 8:54 PM

## 2015-05-28 NOTE — Progress Notes (Signed)
MEDICATION RELATED CONSULT NOTE - Follow Up  Pharmacy Consult for Potassium  Indication: Potassium replacement.    Allergies  Allergen Reactions  . Asa [Aspirin]   . 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 Plavix [Clopidogrel]     Patient Measurements: Height: 5\' 4"  (162.6 cm) Weight: 123 lb 12.8 oz (56.155 kg) IBW/kg (Calculated) : 54.7  Vital Signs: Temp: 98.3 F (36.8 C) (02/28 0506) Temp Source: Oral (02/28 0506) BP: 164/73 mmHg (02/28 0506) Pulse Rate: 96 (02/28 0506) Intake/Output from previous day: 02/27 0701 - 02/28 0700 In: 240 [P.O.:240] Out: 0  Intake/Output from this shift:    Labs:  Recent Labs  05/25/15 0709 05/26/15 1100 05/27/15 0440  WBC  --   --  6.6  HGB  --   --  6.7*  HCT  --   --  19.9*  PLT  --   --  238  CREATININE 3.31* 2.54* 3.23*  MG  --  1.7 2.0   Estimated Creatinine Clearance: 14.8 mL/min (by C-G formula based on Cr of 3.23).   Microbiology: Recent Results (from the past 720 hour(s))  Wound culture     Status: None   Collection Time: 05/20/15 12:25 PM  Result Value Ref Range Status   Specimen Description GROIN RIGHT  Final   Special Requests NONE  Final   Gram Stain   Final    FEW WBC SEEN FEW GRAM NEGATIVE RODS RARE GRAM POSITIVE COCCI    Culture   Final    RARE PSEUDOMONAS AERUGINOSA LIGHT GROWTH VANCOMYCIN RESISTANT ENTEROCOCCUS    Report Status 05/25/2015 FINAL  Final   Organism ID, Bacteria PSEUDOMONAS AERUGINOSA  Final   Organism ID, Bacteria VANCOMYCIN RESISTANT ENTEROCOCCUS  Final      Susceptibility   Pseudomonas aeruginosa - MIC*    CEFTAZIDIME 4 SENSITIVE Sensitive     CIPROFLOXACIN 1 SENSITIVE Sensitive     GENTAMICIN <=1 SENSITIVE Sensitive     IMIPENEM 1 SENSITIVE Sensitive     CEFEPIME 4 SENSITIVE Sensitive     * RARE PSEUDOMONAS AERUGINOSA   Vancomycin resistant enterococcus - MIC*    AMPICILLIN Value in next row Resistant      RESISTANT>=32    VANCOMYCIN Value in next row  Resistant      RESISTANT>=32    GENTAMICIN SYNERGY Value in next row Sensitive      RESISTANT>=32    LINEZOLID Value in next row Sensitive      SENSITIVE2    * LIGHT GROWTH VANCOMYCIN RESISTANT ENTEROCOCCUS  MRSA PCR Screening     Status: None   Collection Time: 05/20/15 12:30 PM  Result Value Ref Range Status   MRSA by PCR NEGATIVE NEGATIVE Final    Comment:        The GeneXpert MRSA Assay (FDA approved for NASAL specimens only), is one component of a comprehensive MRSA colonization surveillance program. It is not intended to diagnose MRSA infection nor to guide or monitor treatment for MRSA infections.   Anaerobic culture     Status: None (Preliminary result)   Collection Time: 05/24/15  8:31 PM  Result Value Ref Range Status   Specimen Description GROIN RIGHT  Final   Special Requests NONE  Final   Culture NO ANAEROBES ISOLATED  Final   Report Status PENDING  Incomplete  Wound culture     Status: None   Collection Time: 05/24/15  9:13 PM  Result Value Ref Range Status   Specimen  Description GROIN RIGHT  Final   Special Requests NONE  Final   Gram Stain FEW WBC SEEN MANY GRAM POSITIVE COCCI IN PAIRS   Final   Culture HEAVY GROWTH VANCOMYCIN RESISTANT ENTEROCOCCUS  Final   Report Status 05/27/2015 FINAL  Final   Organism ID, Bacteria VANCOMYCIN RESISTANT ENTEROCOCCUS  Final      Susceptibility   Vancomycin resistant enterococcus - MIC*    AMPICILLIN >=32 RESISTANT Resistant     VANCOMYCIN >=32 RESISTANT Resistant     GENTAMICIN SYNERGY SENSITIVE Sensitive     LINEZOLID 2 SENSITIVE Sensitive     * HEAVY GROWTH VANCOMYCIN RESISTANT ENTEROCOCCUS    Medical History: Past Medical History  Diagnosis Date  . Hyperlipidemia   . Hypertension   . Thyroid disease   . GERD (gastroesophageal reflux disease)   . Aneurysm ([place])     abdominal aortic  . STD (sexually transmitted disease)     chlamydia  . VAIN II (vaginal intraepithelial neoplasia grade II) 7/09  .  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 B12 deficiency   . COPD (chronic obstructive pulmonary disease) ([place])   . Renal insufficiency   . Cancer ([place])     skin on left ankle 04/10/15 pt states she has never had cancer    Medications:  Prescriptions prior to admission  Medication Sig Dispense Refill Last Dose  . acetaminophen (TYLENOL) 500 MG tablet Take 1,000 mg by mouth every 8 (eight) hours.   unknown at unknown  . albuterol (PROVENTIL) (2.5 MG/3ML) 0.083% nebulizer solution Take 2.5 mg by nebulization every 6 (six) hours as needed for wheezing.   prn at prn  . amLODipine (NORVASC) 5 MG tablet Take 1 tablet (5 mg total) by mouth daily. 30 tablet 6 unknown at unknown  . aspirin EC 81 MG tablet Take 81 mg by mouth daily.   unknown at unknown  . atorvastatin (LIPITOR) 40 MG tablet Take 40 mg by mouth daily.   unknown at unknown  . cefTRIAXone (ROCEPHIN) 1-3.74 GM-% IVPB Inject 1 g into the vein daily.   unknown at unknown  . chlorpheniramine-HYDROcodone (TUSSIONEX) 10-8 MG/5ML SUER Take 5 mLs by mouth every 12 (twelve) hours as needed for cough. 140 mL 0 prn at prn  . docusate sodium (COLACE) 100 MG capsule Take 100 mg by mouth 2 (two) times daily.    unknown at unknown  . doxycycline (VIBRA-TABS) 100 MG tablet Take 100 mg by mouth 2 (two) times daily. For 14 days   unknown at unknown  . famotidine (PEPCID) 20 MG tablet Take 20 mg by mouth daily.   unknown at unknown  . fluticasone (FLONASE) 50 MCG/ACT nasal spray Place 2 sprays into both nostrils daily.   unknown at unknown  . fluticasone furoate-vilanterol (BREO ELLIPTA) 100-25 MCG/INH AEPB Inhale 1 puff into the lungs daily.   unknown at unknown  . guaiFENesin-dextromethorphan (ROBITUSSIN DM) 100-10 MG/5ML syrup Take 10 mLs by mouth every 4 (four) hours as needed for cough. 118 mL 0 prn at prn  . hydrALAZINE (APRESOLINE) 10 MG tablet Take 10 mg by mouth every 6 (six) hours.   unknown at unknown  . irbesartan (AVAPRO) 75 MG  tablet Take 1 tablet (75 mg total) by mouth 2 (two) times daily. 30 tablet 5 unknown at unknown  . levothyroxine (SYNTHROID, LEVOTHROID) 88 MCG tablet Take 88 mcg by mouth daily.   unknown at unknown  . loperamide (IMODIUM) 2 MG capsule Take 2 mg by  mouth every 4 (four) hours as needed for diarrhea or loose stools.   prn at prn  . magnesium hydroxide (MILK OF MAGNESIA) 400 MG/5ML suspension Take 30 mLs by mouth daily as needed for mild constipation.   prn at prn  . metoprolol (LOPRESSOR) 50 MG tablet Take 1 tablet (50 mg total) by mouth every 8 (eight) hours. 90 tablet 5 unknown at unknown  . multivitamin (RENA-VIT) TABS tablet Take 1 tablet by mouth daily.   unknown at unknown  . ondansetron (ZOFRAN) 4 MG tablet Take 4 mg by mouth every 6 (six) hours as needed for nausea or vomiting.   prn at prn  . promethazine (PHENERGAN) 25 MG/ML injection Inject 25 mg into the vein every 6 (six) hours as needed for nausea or vomiting.   prn at prn  . sodium chloride (OCEAN) 0.65 % SOLN nasal spray Place 1 spray into both nostrils 5 (five) times daily.   unknown at unknown    Assessment: Pharmacy consulted to assist in managing electrolytes in this 65 y/o F admitted with sepsis due to B/L PNA.   K= 3.4, Mag= 2.0  Goal of Therapy:  K :  3.5 - 5.1   Plan:  K 3.7. Will continue 40 mEq po once daily for now and recheck with AM labs.  Laural Benes, Pharm.D., BCPS Clinical Pharmacist 05/28/2015,5:40 AM

## 2015-05-28 NOTE — Progress Notes (Signed)
Post hd tx 

## 2015-05-28 NOTE — Anesthesia Postprocedure Evaluation (Signed)
Anesthesia Post Note  Patient: [name]  Procedure(s) Performed: Procedure(s) (LRB): GROIN DEBRIDEMENT (Right)  Patient location during evaluation: PACU Anesthesia Type: General Level of consciousness: awake and alert Pain management: pain level controlled Vital Signs Assessment: post-procedure vital signs reviewed and stable Respiratory status: spontaneous breathing, nonlabored ventilation, respiratory function stable and patient connected to nasal cannula oxygen Cardiovascular status: blood pressure returned to baseline and stable Postop Assessment: no signs of nausea or vomiting Anesthetic complications: no    Last Vitals:  Filed Vitals:   05/28/15 1406 05/28/15 2057  BP: 157/69 142/60  Pulse: 106 94  Temp: 36.9 C 36.8 C  Resp: 17 18    Last Pain:  Filed Vitals:   05/28/15 2057  PainSc: 0-No pain                 Martha Clan

## 2015-05-29 ENCOUNTER — Inpatient Hospital Stay: Payer: Medicare Other

## 2015-05-29 ENCOUNTER — Encounter
Admission: EM | Disposition: A | Discharge status: Skilled Nursing Facility | Payer: Self-pay | Source: Home / Self Care | Attending: Internal Medicine

## 2015-05-29 ENCOUNTER — Ambulatory Visit: Admission: RE | Admit: 2015-05-29 | Payer: Medicare Other | Source: Ambulatory Visit | Admitting: Vascular Surgery

## 2015-05-29 LAB — BASIC METABOLIC PANEL
Anion gap: 5 (ref 5–15)
BUN: 12 mg/dL (ref 6–20)
CHLORIDE: 102 mmol/L (ref 101–111)
CO2: 31 mmol/L (ref 22–32)
Calcium: 8.4 mg/dL — ABNORMAL LOW (ref 8.9–10.3)
Creatinine, Ser: 2.61 mg/dL — ABNORMAL HIGH (ref 0.44–1.00)
GFR calc Af Amer: 21 mL/min — ABNORMAL LOW (ref >60)
GFR calc non Af Amer: 18 mL/min — ABNORMAL LOW (ref >60)
GLUCOSE: 87 mg/dL (ref 65–99)
Potassium: 4.3 mmol/L (ref 3.5–5.1)
Sodium: 138 mmol/L (ref 135–145)

## 2015-05-29 LAB — BLOOD GAS, ARTERIAL
ALLENS TEST (PASS/FAIL): POSITIVE — AB
Acid-Base Excess: 8.4 mmol/L — ABNORMAL HIGH (ref 0.0–3.0)
Bicarbonate: 31.8 mEq/L — ABNORMAL HIGH (ref 21.0–28.0)
FIO2: 1
O2 Saturation: 99.8 %
PCO2 ART: 38 mmHg (ref 32.0–48.0)
PH ART: 7.53 — AB (ref 7.350–7.450)
Patient temperature: 37
pO2, Arterial: 203 mmHg — ABNORMAL HIGH (ref 83.0–108.0)

## 2015-05-29 LAB — CBC
HEMATOCRIT: 19.5 % — AB (ref 35.0–47.0)
Hemoglobin: 6.7 g/dL — ABNORMAL LOW (ref 12.0–16.0)
MCH: 33.6 pg (ref 26.0–34.0)
MCHC: 34.4 g/dL (ref 32.0–36.0)
MCV: 97.5 fL (ref 80.0–100.0)
PLATELETS: 221 10*3/uL (ref 150–440)
RBC: 2 MIL/uL — AB (ref 3.80–5.20)
RDW: 15.5 % — AB (ref 11.5–14.5)
WBC: 6.1 10*3/uL (ref 3.6–11.0)

## 2015-05-29 LAB — PHOSPHORUS: Phosphorus: 2.9 mg/dL (ref 2.5–4.6)

## 2015-05-29 LAB — MAGNESIUM: Magnesium: 1.9 mg/dL (ref 1.7–2.4)

## 2015-05-29 LAB — PREPARE RBC (CROSSMATCH)

## 2015-05-29 SURGERY — INSERTION OF ARTERIOVENOUS (AV) GORE-TEX GRAFT ARM
Anesthesia: General | Laterality: Left

## 2015-05-29 MED ORDER — SODIUM CHLORIDE 0.9 % IV SOLN
300.0000 mg | INTRAVENOUS | Status: DC
  Filled 2015-05-29 (×2): qty 6

## 2015-05-29 MED ORDER — FUROSEMIDE 10 MG/ML IJ SOLN
20.0000 mg | Freq: Once | INTRAMUSCULAR | Status: AC
  Administered 2015-05-29: 20 mg via INTRAVENOUS
  Filled 2015-05-29: qty 2

## 2015-05-29 MED ORDER — NITROGLYCERIN IN D5W 200-5 MCG/ML-% IV SOLN
0.0000 ug/min | INTRAVENOUS | Status: DC
  Administered 2015-05-29: 5 ug/min via INTRAVENOUS
  Filled 2015-05-29: qty 250

## 2015-05-29 MED ORDER — IPRATROPIUM-ALBUTEROL 0.5-2.5 (3) MG/3ML IN SOLN
3.0000 mL | Freq: Four times a day (QID) | RESPIRATORY_TRACT | Status: DC
  Administered 2015-05-30 (×3): 3 mL via RESPIRATORY_TRACT
  Filled 2015-05-29 (×6): qty 3

## 2015-05-29 MED ORDER — SODIUM CHLORIDE 0.9 % IV SOLN
Freq: Once | INTRAVENOUS | Status: AC
  Administered 2015-05-29: 15:00:00 via INTRAVENOUS

## 2015-05-29 MED ORDER — MORPHINE SULFATE (PF) 2 MG/ML IV SOLN
2.0000 mg | Freq: Once | INTRAVENOUS | Status: AC
  Administered 2015-05-29: 2 mg via INTRAVENOUS
  Filled 2015-05-29: qty 1

## 2015-05-29 NOTE — Consult Note (Signed)
Pharmacy Antibiotic Note  [name] is a 65 y.o. female admitted on 05/20/2015 with wound infection.  Pharmacy has been consulted for ceftazidime and daptomycin dosing.   Plan: Daptomycin 300 mg IV q48h. Will need weekly CPK levels.  Will need to continue to follow HD schedule and ensure doses are given after HD.  Continue ceftazidime 1gm q24hrs.   Height: 5\' 4"  (162.6 cm) Weight: 113 lb 12.1 oz (51.6 kg) IBW/kg (Calculated) : 54.7  Temp (24hrs), Avg:98.2 F (36.8 C), Min:97.9 F (36.6 C), Max:98.4 F (36.9 C)   Recent Labs Lab 05/24/15 0659 05/25/15 0709 05/25/15 1056 05/26/15 1100 05/27/15 0440 05/29/15 0451  WBC 6.2  --   --   --  6.6  --   CREATININE 2.41* 3.31*  --  2.54* 3.23* 2.61*  VANCOTROUGH  --   --  12  --   --   --     Estimated Creatinine Clearance: 17.3 mL/min (by C-G formula based on Cr of 2.61).    Allergies  Allergen Reactions  . Asa [Aspirin]   . 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 Plavix [Clopidogrel]    2/27 CK 28  Antimicrobials this admission: Anti-infectives    Start     Dose/Rate Route Frequency Ordered Stop   05/28/15 1200  vancomycin (VANCOCIN) 750 mg in sodium chloride 0.9 % 100 mL IVPB  Status:  Discontinued     750 mg 100 mL/hr over 60 Minutes Intravenous Every T-Th-Sa (Hemodialysis) 05/25/15 1227 05/27/15 1110   05/28/15 1200  DAPTOmycin (CUBICIN) 250 mg in sodium chloride 0.9 % IVPB     250 mg 210 mL/hr over 30 Minutes Intravenous Every T-Th-Sa (Hemodialysis) 05/27/15 1114     05/27/15 1200  DAPTOmycin (CUBICIN) 250 mg in sodium chloride 0.9 % IVPB     250 mg 210 mL/hr over 30 Minutes Intravenous  Once 05/27/15 1114 05/27/15 1444   05/27/15 1115  DAPTOmycin (CUBICIN) 250 mg in sodium chloride 0.9 % IVPB  Status:  Discontinued     250 mg 210 mL/hr over 30 Minutes Intravenous Every 24 hours 05/27/15 1111 05/27/15 1114   05/26/15 1000  cefTAZidime (FORTAZ) 1 g in dextrose 5 % 50 mL IVPB     1 g 100  mL/hr over 30 Minutes Intravenous Every 24 hours 05/25/15 0311     05/25/15 1200  vancomycin (VANCOCIN) 500 mg in sodium chloride 0.9 % 100 mL IVPB  Status:  Discontinued     500 mg 100 mL/hr over 60 Minutes Intravenous Every T-Th-Sa (Hemodialysis) 05/24/15 1311 05/25/15 1227   05/25/15 0315  cefTAZidime (FORTAZ) 1 g in dextrose 5 % 50 mL IVPB     1 g 100 mL/hr over 30 Minutes Intravenous  Once 05/25/15 0311 05/25/15 0521   05/24/15 0000  ceFAZolin (ANCEF) IVPB 1 g/50 mL premix    Comments:  Send with pt to OR   1 g 100 mL/hr over 30 Minutes Intravenous On call 05/23/15 1752 05/25/15 0000   05/21/15 1200  vancomycin (VANCOCIN) 500 mg in sodium chloride 0.9 % 100 mL IVPB  Status:  Discontinued     500 mg 100 mL/hr over 60 Minutes Intravenous Every T-Th-Sa (Hemodialysis) 05/20/15 1355 05/23/15 1522   05/20/15 1830  piperacillin-tazobactam (ZOSYN) IVPB 3.375 g  Status:  Discontinued     3.375 g 12.5 mL/hr over 240 Minutes Intravenous Every 12 hours 05/20/15 1135 05/24/15 1855   05/20/15 1145  vancomycin (VANCOCIN) 1,250 mg  in sodium chloride 0.9 % 250 mL IVPB  Status:  Discontinued     1,250 mg 166.7 mL/hr over 90 Minutes Intravenous  Once 05/20/15 1130 05/20/15 1130   05/20/15 1145  vancomycin (VANCOCIN) 500 mg in sodium chloride 0.9 % 100 mL IVPB     500 mg 100 mL/hr over 60 Minutes Intravenous  Once 05/20/15 1133 05/20/15 1320   05/20/15 0830  vancomycin (VANCOCIN) IVPB 1000 mg/200 mL premix     1,000 mg 200 mL/hr over 60 Minutes Intravenous  Once 05/20/15 0817 05/20/15 1000   05/20/15 0830  piperacillin-tazobactam (ZOSYN) IVPB 3.375 g     3.375 g 12.5 mL/hr over 240 Minutes Intravenous  Once 05/20/15 [id number] 05/20/15 0900      Microbiology results: Results for orders placed or performed during the hospital encounter of 05/20/15  Wound culture     Status: None   Collection Time: 05/20/15 12:25 PM  Result Value Ref Range Status   Specimen Description GROIN RIGHT  Final   Special  Requests NONE  Final   Gram Stain   Final    FEW WBC SEEN FEW GRAM NEGATIVE RODS RARE GRAM POSITIVE COCCI    Culture   Final    RARE PSEUDOMONAS AERUGINOSA LIGHT GROWTH VANCOMYCIN RESISTANT ENTEROCOCCUS    Report Status 05/25/2015 FINAL  Final   Organism ID, Bacteria PSEUDOMONAS AERUGINOSA  Final   Organism ID, Bacteria VANCOMYCIN RESISTANT ENTEROCOCCUS  Final      Susceptibility   Pseudomonas aeruginosa - MIC*    CEFTAZIDIME 4 SENSITIVE Sensitive     CIPROFLOXACIN 1 SENSITIVE Sensitive     GENTAMICIN <=1 SENSITIVE Sensitive     IMIPENEM 1 SENSITIVE Sensitive     CEFEPIME 4 SENSITIVE Sensitive     * RARE PSEUDOMONAS AERUGINOSA   Vancomycin resistant enterococcus - MIC*    AMPICILLIN Value in next row Resistant      RESISTANT>=32    VANCOMYCIN Value in next row Resistant      RESISTANT>=32    GENTAMICIN SYNERGY Value in next row Sensitive      RESISTANT>=32    LINEZOLID Value in next row Sensitive      SENSITIVE2    * LIGHT GROWTH VANCOMYCIN RESISTANT ENTEROCOCCUS  MRSA PCR Screening     Status: None   Collection Time: 05/20/15 12:30 PM  Result Value Ref Range Status   MRSA by PCR NEGATIVE NEGATIVE Final    Comment:        The GeneXpert MRSA Assay (FDA approved for NASAL specimens only), is one component of a comprehensive MRSA colonization surveillance program. It is not intended to diagnose MRSA infection nor to guide or monitor treatment for MRSA infections.   Anaerobic culture     Status: None   Collection Time: 05/24/15  8:31 PM  Result Value Ref Range Status   Specimen Description GROIN RIGHT  Final   Special Requests NONE  Final   Culture NO ANAEROBES ISOLATED  Final   Report Status 05/28/2015 FINAL  Final  Wound culture     Status: None   Collection Time: 05/24/15  9:13 PM  Result Value Ref Range Status   Specimen Description GROIN RIGHT  Final   Special Requests NONE  Final   Gram Stain FEW WBC SEEN MANY GRAM POSITIVE COCCI IN PAIRS   Final    Culture HEAVY GROWTH VANCOMYCIN RESISTANT ENTEROCOCCUS  Final   Report Status 05/27/2015 FINAL  Final   Organism ID, Bacteria VANCOMYCIN RESISTANT ENTEROCOCCUS  Final  Susceptibility   Vancomycin resistant enterococcus - MIC*    AMPICILLIN >=32 RESISTANT Resistant     VANCOMYCIN >=32 RESISTANT Resistant     GENTAMICIN SYNERGY SENSITIVE Sensitive     LINEZOLID 2 SENSITIVE Sensitive     * HEAVY GROWTH VANCOMYCIN RESISTANT ENTEROCOCCUS    Thank you for allowing pharmacy to be a part of this patient's care.  Rocky Morel 05/29/2015 11:16 AM

## 2015-05-29 NOTE — Progress Notes (Signed)
Pt arrived to room from [facility] after a rapid response.  Report received from nurse.

## 2015-05-29 NOTE — Progress Notes (Signed)
Notified Dr. Posey Pronto of blood gas results. NO new orders received, will continue to monitor.

## 2015-05-29 NOTE — Progress Notes (Signed)
MEDICATION RELATED CONSULT NOTE - Follow Up  Pharmacy Consult for Potassium  Indication: Potassium replacement.    Allergies  Allergen Reactions  . Asa [Aspirin]   . 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 Plavix [Clopidogrel]     Patient Measurements: Height: 5\' 4"  (162.6 cm) Weight: 113 lb 12.1 oz (51.6 kg) IBW/kg (Calculated) : 54.7  Vital Signs: Temp: 97.9 F (36.6 C) (03/01 0600) Temp Source: Oral (03/01 0600) BP: 144/63 mmHg (03/01 0600) Pulse Rate: 81 (03/01 0600) Intake/Output from previous day: 02/28 0701 - 03/01 0700 In: 0  Out: 1500  Intake/Output from this shift:    Labs:  Recent Labs  05/26/15 1100 05/27/15 0440 05/29/15 0451  WBC  --  6.6  --   HGB  --  6.7*  --   HCT  --  19.9*  --   PLT  --  238  --   CREATININE 2.54* 3.23* 2.61*  MG 1.7 2.0 1.9  PHOS  --   --  2.9   Estimated Creatinine Clearance: 17.3 mL/min (by C-G formula based on Cr of 2.61).   Microbiology: Recent Results (from the past 720 hour(s))  Wound culture     Status: None   Collection Time: 05/20/15 12:25 PM  Result Value Ref Range Status   Specimen Description GROIN RIGHT  Final   Special Requests NONE  Final   Gram Stain   Final    FEW WBC SEEN FEW GRAM NEGATIVE RODS RARE GRAM POSITIVE COCCI    Culture   Final    RARE PSEUDOMONAS AERUGINOSA LIGHT GROWTH VANCOMYCIN RESISTANT ENTEROCOCCUS    Report Status 05/25/2015 FINAL  Final   Organism ID, Bacteria PSEUDOMONAS AERUGINOSA  Final   Organism ID, Bacteria VANCOMYCIN RESISTANT ENTEROCOCCUS  Final      Susceptibility   Pseudomonas aeruginosa - MIC*    CEFTAZIDIME 4 SENSITIVE Sensitive     CIPROFLOXACIN 1 SENSITIVE Sensitive     GENTAMICIN <=1 SENSITIVE Sensitive     IMIPENEM 1 SENSITIVE Sensitive     CEFEPIME 4 SENSITIVE Sensitive     * RARE PSEUDOMONAS AERUGINOSA   Vancomycin resistant enterococcus - MIC*    AMPICILLIN Value in next row Resistant      RESISTANT>=32    VANCOMYCIN Value in  next row Resistant      RESISTANT>=32    GENTAMICIN SYNERGY Value in next row Sensitive      RESISTANT>=32    LINEZOLID Value in next row Sensitive      SENSITIVE2    * LIGHT GROWTH VANCOMYCIN RESISTANT ENTEROCOCCUS  MRSA PCR Screening     Status: None   Collection Time: 05/20/15 12:30 PM  Result Value Ref Range Status   MRSA by PCR NEGATIVE NEGATIVE Final    Comment:        The GeneXpert MRSA Assay (FDA approved for NASAL specimens only), is one component of a comprehensive MRSA colonization surveillance program. It is not intended to diagnose MRSA infection nor to guide or monitor treatment for MRSA infections.   Anaerobic culture     Status: None   Collection Time: 05/24/15  8:31 PM  Result Value Ref Range Status   Specimen Description GROIN RIGHT  Final   Special Requests NONE  Final   Culture NO ANAEROBES ISOLATED  Final   Report Status 05/28/2015 FINAL  Final  Wound culture     Status: None   Collection Time: 05/24/15  9:13 PM  Result Value  Ref Range Status   Specimen Description GROIN RIGHT  Final   Special Requests NONE  Final   Gram Stain FEW WBC SEEN MANY GRAM POSITIVE COCCI IN PAIRS   Final   Culture HEAVY GROWTH VANCOMYCIN RESISTANT ENTEROCOCCUS  Final   Report Status 05/27/2015 FINAL  Final   Organism ID, Bacteria VANCOMYCIN RESISTANT ENTEROCOCCUS  Final      Susceptibility   Vancomycin resistant enterococcus - MIC*    AMPICILLIN >=32 RESISTANT Resistant     VANCOMYCIN >=32 RESISTANT Resistant     GENTAMICIN SYNERGY SENSITIVE Sensitive     LINEZOLID 2 SENSITIVE Sensitive     * HEAVY GROWTH VANCOMYCIN RESISTANT ENTEROCOCCUS    Medical History: Past Medical History  Diagnosis Date  . Hyperlipidemia   . Hypertension   . Thyroid disease   . GERD (gastroesophageal reflux disease)   . Aneurysm ([place])     abdominal aortic  . STD (sexually transmitted disease)     chlamydia  . VAIN II (vaginal intraepithelial neoplasia grade II) 7/09  . Granuloma  annulare 2010    skin- sees dermatologist  . B12 deficiency   . COPD (chronic obstructive pulmonary disease) ([place])   . Renal insufficiency   . Cancer ([place])     skin on left ankle 04/10/15 pt states she has never had cancer    Medications:  Prescriptions prior to admission  Medication Sig Dispense Refill Last Dose  . acetaminophen (TYLENOL) 500 MG tablet Take 1,000 mg by mouth every 8 (eight) hours.   unknown at unknown  . albuterol (PROVENTIL) (2.5 MG/3ML) 0.083% nebulizer solution Take 2.5 mg by nebulization every 6 (six) hours as needed for wheezing.   prn at prn  . amLODipine (NORVASC) 5 MG tablet Take 1 tablet (5 mg total) by mouth daily. 30 tablet 6 unknown at unknown  . aspirin EC 81 MG tablet Take 81 mg by mouth daily.   unknown at unknown  . atorvastatin (LIPITOR) 40 MG tablet Take 40 mg by mouth daily.   unknown at unknown  . cefTRIAXone (ROCEPHIN) 1-3.74 GM-% IVPB Inject 1 g into the vein daily.   unknown at unknown  . chlorpheniramine-HYDROcodone (TUSSIONEX) 10-8 MG/5ML SUER Take 5 mLs by mouth every 12 (twelve) hours as needed for cough. 140 mL 0 prn at prn  . docusate sodium (COLACE) 100 MG capsule Take 100 mg by mouth 2 (two) times daily.    unknown at unknown  . doxycycline (VIBRA-TABS) 100 MG tablet Take 100 mg by mouth 2 (two) times daily. For 14 days   unknown at unknown  . famotidine (PEPCID) 20 MG tablet Take 20 mg by mouth daily.   unknown at unknown  . fluticasone (FLONASE) 50 MCG/ACT nasal spray Place 2 sprays into both nostrils daily.   unknown at unknown  . fluticasone furoate-vilanterol (BREO ELLIPTA) 100-25 MCG/INH AEPB Inhale 1 puff into the lungs daily.   unknown at unknown  . guaiFENesin-dextromethorphan (ROBITUSSIN DM) 100-10 MG/5ML syrup Take 10 mLs by mouth every 4 (four) hours as needed for cough. 118 mL 0 prn at prn  . hydrALAZINE (APRESOLINE) 10 MG tablet Take 10 mg by mouth every 6 (six) hours.   unknown at unknown  . irbesartan (AVAPRO) 75 MG tablet Take  1 tablet (75 mg total) by mouth 2 (two) times daily. 30 tablet 5 unknown at unknown  . levothyroxine (SYNTHROID, LEVOTHROID) 88 MCG tablet Take 88 mcg by mouth daily.   unknown at unknown  . loperamide (IMODIUM) 2  MG capsule Take 2 mg by mouth every 4 (four) hours as needed for diarrhea or loose stools.   prn at prn  . magnesium hydroxide (MILK OF MAGNESIA) 400 MG/5ML suspension Take 30 mLs by mouth daily as needed for mild constipation.   prn at prn  . metoprolol (LOPRESSOR) 50 MG tablet Take 1 tablet (50 mg total) by mouth every 8 (eight) hours. 90 tablet 5 unknown at unknown  . multivitamin (RENA-VIT) TABS tablet Take 1 tablet by mouth daily.   unknown at unknown  . ondansetron (ZOFRAN) 4 MG tablet Take 4 mg by mouth every 6 (six) hours as needed for nausea or vomiting.   prn at prn  . promethazine (PHENERGAN) 25 MG/ML injection Inject 25 mg into the vein every 6 (six) hours as needed for nausea or vomiting.   prn at prn  . sodium chloride (OCEAN) 0.65 % SOLN nasal spray Place 1 spray into both nostrils 5 (five) times daily.   unknown at unknown    Assessment: Pharmacy consulted to assist in managing electrolytes in this 66 y/o F admitted with sepsis due to B/L PNA.   K= 3.4, Mag= 2.0  Goal of Therapy:  K :  3.5 - 5.1   Plan:  K 4.3, Mg 1.9, Phos 2.9. Stopped oral potassium. Will recheck electrolytes tomorrow with AM labs.  Laural Benes, Pharm.D., BCPS Clinical Pharmacist 05/29/2015,6:14 AM

## 2015-05-29 NOTE — Progress Notes (Signed)
[place] INFECTIOUS DISEASE PROGRESS NOTE Date of Admission:  05/20/2015     ID: [name] is a 65 y.o. female with pseudomonal R groin seroma infection Active Problems:   Sepsis ([place])   Subjective: Doing well, no fevers  ROS  Eleven systems are reviewed and negative except per hpi  Medications:  Antibiotics Given (last 72 hours)    Date/Time Action Medication Dose Rate   05/27/15 0855 Given   cefTAZidime (FORTAZ) 1 g in dextrose 5 % 50 mL IVPB 1 g 100 mL/hr   05/27/15 1414 Given   DAPTOmycin (CUBICIN) 250 mg in sodium chloride 0.9 % IVPB 250 mg 210 mL/hr   05/28/15 1503 Given   cefTAZidime (FORTAZ) 1 g in dextrose 5 % 50 mL IVPB 1 g 100 mL/hr   05/28/15 1509 Given   DAPTOmycin (CUBICIN) 250 mg in sodium chloride 0.9 % IVPB 250 mg 210 mL/hr     . sodium chloride   Intravenous Once  . acetaminophen  1,000 mg Oral 3 times per day  . antiseptic oral rinse  7 mL Mouth Rinse BID  . aspirin EC  81 mg Oral Daily  . atorvastatin  40 mg Oral Daily  . cefTAZidime (FORTAZ)  IV  1 g Intravenous Q24H  . [START ON 05/30/2015] DAPTOmycin (CUBICIN)  IV  300 mg Intravenous Q48H  . docusate sodium  100 mg Oral BID  . famotidine  20 mg Oral Q48H  . fluticasone  2 spray Each Nare Daily  . ipratropium-albuterol  3 mL Nebulization Q6H  . levothyroxine  88 mcg Oral Daily  . metoprolol  25 mg Oral BID  . mometasone-formoterol  2 puff Inhalation BID  . multivitamin  1 tablet Oral Daily  . potassium chloride  40 mEq Oral Once  . sodium chloride  1 spray Each Nare 5 X Daily    Objective: Vital signs in last 24 hours: Temp:  [97.9 F (36.6 C)-98.3 F (36.8 C)] 98.3 F (36.8 C) (03/01 1132) Pulse Rate:  [81-94] 92 (03/01 1132) Resp:  [18] 18 (03/01 1132) BP: (135-147)/(60-87) 147/87 mmHg (03/01 1132) SpO2:  [96 %-100 %] 96 % (03/01 1132) Constitutional: Thin, chronically ill appearing HENT: [place]/AT, PERRLA, no scleral icterus Mouth/Throat: Oropharynx is clear and moist. No  oropharyngeal exudate.  Cardiovascular: Normal rate,  R chest wall HD cath wnl Pulmonary/Chest: bil rhonchi Neck = supple, no nuchal rigidity Abdominal: Soft. Bowel sounds are normal. exhibits no distension. There is no tenderness.  R groin site covered with wound vac Lymphadenopathy: no cervical adenopathy. No axillary adenopathy Neurological: alert and oriented to person, place, and time.  Skin: Skin is warm and dry.  Psychiatric: a normal mood and affect. behavior is normal.  Lab Results  Recent Labs  05/27/15 0440 05/28/15 0400 05/29/15 0451  WBC 6.6  --  6.1  HGB 6.7*  --  6.7*  HCT 19.9*  --  19.5*  NA 137  --  138  K 3.4* 3.7 4.3  CL 99*  --  102  CO2 29  --  31  BUN 19  --  12  CREATININE 3.23*  --  2.61*    Microbiology: Results for orders placed or performed during the hospital encounter of 05/20/15  Wound culture     Status: None   Collection Time: 05/20/15 12:25 PM  Result Value Ref Range Status   Specimen Description GROIN RIGHT  Final   Special Requests NONE  Final   Gram Stain  Final    FEW WBC SEEN FEW GRAM NEGATIVE RODS RARE GRAM POSITIVE COCCI    Culture   Final    RARE PSEUDOMONAS AERUGINOSA LIGHT GROWTH VANCOMYCIN RESISTANT ENTEROCOCCUS    Report Status 05/25/2015 FINAL  Final   Organism ID, Bacteria PSEUDOMONAS AERUGINOSA  Final   Organism ID, Bacteria VANCOMYCIN RESISTANT ENTEROCOCCUS  Final      Susceptibility   Pseudomonas aeruginosa - MIC*    CEFTAZIDIME 4 SENSITIVE Sensitive     CIPROFLOXACIN 1 SENSITIVE Sensitive     GENTAMICIN <=1 SENSITIVE Sensitive     IMIPENEM 1 SENSITIVE Sensitive     CEFEPIME 4 SENSITIVE Sensitive     * RARE PSEUDOMONAS AERUGINOSA   Vancomycin resistant enterococcus - MIC*    AMPICILLIN Value in next row Resistant      RESISTANT>=32    VANCOMYCIN Value in next row Resistant      RESISTANT>=32    GENTAMICIN SYNERGY Value in next row Sensitive      RESISTANT>=32    LINEZOLID Value in next row  Sensitive      SENSITIVE2    * LIGHT GROWTH VANCOMYCIN RESISTANT ENTEROCOCCUS  MRSA PCR Screening     Status: None   Collection Time: 05/20/15 12:30 PM  Result Value Ref Range Status   MRSA by PCR NEGATIVE NEGATIVE Final    Comment:        The GeneXpert MRSA Assay (FDA approved for NASAL specimens only), is one component of a comprehensive MRSA colonization surveillance program. It is not intended to diagnose MRSA infection nor to guide or monitor treatment for MRSA infections.   Anaerobic culture     Status: None   Collection Time: 05/24/15  8:31 PM  Result Value Ref Range Status   Specimen Description GROIN RIGHT  Final   Special Requests NONE  Final   Culture NO ANAEROBES ISOLATED  Final   Report Status 05/28/2015 FINAL  Final  Wound culture     Status: None   Collection Time: 05/24/15  9:13 PM  Result Value Ref Range Status   Specimen Description GROIN RIGHT  Final   Special Requests NONE  Final   Gram Stain FEW WBC SEEN MANY GRAM POSITIVE COCCI IN PAIRS   Final   Culture HEAVY GROWTH VANCOMYCIN RESISTANT ENTEROCOCCUS  Final   Report Status 05/27/2015 FINAL  Final   Organism ID, Bacteria VANCOMYCIN RESISTANT ENTEROCOCCUS  Final      Susceptibility   Vancomycin resistant enterococcus - MIC*    AMPICILLIN >=32 RESISTANT Resistant     VANCOMYCIN >=32 RESISTANT Resistant     GENTAMICIN SYNERGY SENSITIVE Sensitive     LINEZOLID 2 SENSITIVE Sensitive     * HEAVY GROWTH VANCOMYCIN RESISTANT ENTEROCOCCUS    Studies/Results: Dg Chest 2 View  05/28/2015  CLINICAL DATA:  65 year old female with history of shortness breath this morning. EXAM: CHEST  2 VIEW COMPARISON:  Chest x-ray 05/20/2015. FINDINGS: Right internal jugular PermCath with tips terminating in the mid and distal superior vena cava. Moderate bilateral pleural effusions layering dependently. Associated with this there are bibasilar opacities which are presumably areas of passive atelectasis. Remaining portions of  the lungs are otherwise well aerated. Mild diffuse peribronchial cuffing. No evidence of pulmonary edema. Heart size is normal. Upper mediastinal contours are within normal limits. Atherosclerosis in the thoracic aorta. Vascular stent in the mid abdomen incompletely visualize (likely within the abdominal aorta). IMPRESSION: 1. Moderate bilateral pleural effusions likely with bibasilar passive subsegmental atelectasis. 2. Diffuse peribronchial  cuffing, which could be seen in the setting of acute bronchitis. 3. Atherosclerosis. Electronically Signed   By: Vinnie Langton M.D.   On: 05/28/2015 15:50   Ct Abdomen Pelvis W Contrast  05/21/2015  CLINICAL DATA:  Fever of 102.3 degrees in shortness of breath, right lower lobe pneumonia and sepsis with hypoxia, right groin seroma status post incision and drainage with wound VAC with continued serosanguineous drainage, sepsis, diastolic congestive heart failure EXAM: CT ABDOMEN AND PELVIS WITH CONTRAST TECHNIQUE: Multidetector CT imaging of the abdomen and pelvis was performed using the standard protocol following bolus administration of intravenous contrast. CONTRAST:  47mL OMNIPAQUE IOHEXOL 300 MG/ML  SOLN COMPARISON:  04/10/2015 FINDINGS: Lower chest: Large bilateral pleural effusions with underlying atelectasis. Bilateral lower lobe consolidation right greater than left with air bronchograms. Patchy interstitial infiltrate right middle lobe. Hepatobiliary: Sub cm cyst right lobe stable. Sub cm calcified gallstone stable. Pancreas: Normal Spleen: No focal abnormalities Adrenals/Urinary Tract: Stable bilateral renal atrophy. Bladder is normal. Stomach/Bowel: Fecal retention throughout the ascending colon. Nonobstructive bowel gas pattern. Vascular/Lymphatic: Stable 4.5 cm infrarenal abdominal aortic aneurysm with stent graft. Stable since then bypass. In the right inguinal region, there is a fluid-filled structure with thick enhancing wall. It measures 38 x 39 mm, not  significantly different in size. There is discrete increase in the thickness and degree of enhancement involving the wall. In the left inguinal region there is a similar but smaller fluid collection. It measures about 39 x 29 mm, also slightly larger. It has also developed ran enhancement. Reproductive: Not visualized Other: Diffuse increased attenuation throughout the subcutaneous soft tissues worse when compared to the prior study consistent with anasarca. Musculoskeletal: No acute musculoskeletal findings. IMPRESSION: 1. Large pleural effusions with anasarca. Anasarca is worse when compared to the prior study. 2. Bilateral lower lobe consolidation is likely largely due to atelectasis, right greater than left. Additionally there is patchy infiltrate throughout the right middle and lower lobe consistent with clinical history of pneumonia. 3. Bilateral inguinal fluid extends both appear mildly larger when compared to prior study and have developed thick enhancing rims. Sterility of the collections on certain. Electronically Signed   By: Skipper Cliche M.D.   On: 05/21/2015 20:15   Dg Chest Port 1 View  05/20/2015  CLINICAL DATA:  65 year old female with respiratory distress and lower extremity swelling. EXAM: PORTABLE CHEST 1 VIEW COMPARISON:  Radiograph dated 04/17/2015 FINDINGS: A dual lumen dialysis catheter is noted in stable positioning. There has been interval removal of the left IJ central line. Single portable view of the chest demonstrates emphysematous changes lungs. There are bilateral hazy densities, right greater left. There has been interval improvement of the interstitial prominence, likely interval improvement in pulmonary edema. Small left pleural effusion may be present. The cardiac silhouette is within normal limits. No acute osseous pathology identified. IMPRESSION: Interval removal of the left IJ central venous line. Interval improvement of the previously mild pulmonary edema. Bibasilar hazy  airspace densities, right appearing left. Electronically Signed   By: Anner Crete M.D.   On: 05/20/2015 08:30    Assessment/Plan: LIVVY DHALIWAL is a 65 y.o. female with a complicated vascular history as detailed by Dr Ronalee Belts and Lucky Cowboy. She is currently dealing with a seroma in her R groin, draining purulent material and with surrounding redness, pain and induration . On admission she had temp 102.3 and was hypoxic with CXR suggesting RLL pna.  Since admission she has been treated with vanco and zosyn, and received  HD. Clinically she is improving and culture from the R wound is now growing pseudomonas and VRE  CT does show bilateral rim enhancing fluid collections S/p I and D 2/24 by Dr Delana Meyer.   Recommendations Cont ceftazidime and daptomycin (Pseudomonas and VRE)  - Will need long term IV therapy to help this heal  - can use ceftazidime and dapto at HD  Uncertain if the grafts are infected but if so would need long term suppressive abx. Will discuss with Dr Lucky Cowboy and Delana Meyer.  I have discussed with micro and the VRE is resistant to all  oral agents except linezolid, sensitive to tigecycline, syncercid. Dapto sensitivities have been sent off Could use intermittent ceftazidime or aminogylcoside if suppression is needed and can give at HD  Thank you very much for the consult. Will follow with you.  [place], [place]   05/29/2015, 3:02 PM

## 2015-05-29 NOTE — Significant Event (Signed)
Rapid Response Event Note  Overview:  RRT called to CT Rm 1 for pt in respiratory distress.  Pt taken down for CT Chest however was unable to tolerate laying flat and became very short of breath.     Initial Focused Assessment: Patient sitting in bed at 45 degrees with 55% venti mask, tachypneic breathing 35-40 breaths per minute, using accessory muscles.  Skin color ashen, warm, clammy, and diaphoretic. SpO2 on 55% venti mask 70-76%.  Cardiac monitor showing Sinus tach 150s.  Interventions: HOB elevated to upright/high Fowler's, pt placed on 100% non rebreather mask, and instructed to take slow deep breaths.  Dr. Jannifer Franklin, hospitalist, paged and transferred to ICU for [place]. Dr. Jannifer Franklin at bedside in ICU 20.  Chest x-ray ordered and obtained.  Event Summary:   RRT called at 2018      Arrived in CT Rm 1 at 2020     Pt transferred to ICU at 2040  Farris Has D

## 2015-05-29 NOTE — Care Management Important Message (Signed)
Important Message  Patient Details  Name: PRINCE NAYYAR MRN: [id number] Date of Birth: 11/25/1950   Medicare Important Message Given:  Yes    Beverly Sessions, RN 05/29/2015, 10:14 AM

## 2015-05-29 NOTE — Progress Notes (Signed)
[facility] [place] NAME: [name]    MR#:  [id number]  DATE OF BIRTH:  1950/12/30  SUBJECTIVE:  Patient was seen and examined in dialysis unit. Hemoglobin 6.7 .S/p I and D of seroma of groin 2/24  REVIEW OF SYSTEMS:   Review of Systems  Constitutional: Negative for fever, chills and weight loss.  HENT: Negative for ear discharge, ear pain and nosebleeds.   Eyes: Negative for blurred vision, pain, discharge and redness.  Respiratory: Negative for sputum production, shortness of breath, wheezing and stridor.   Cardiovascular: Negative for chest pain, palpitations, orthopnea and PND.  Gastrointestinal: Negative for nausea, vomiting, abdominal pain and diarrhea.  Genitourinary: Negative for urgency and frequency.  Musculoskeletal: Negative for back pain and joint pain.  Skin:       Right groin with wound VAC  Neurological: Negative for sensory change, speech change, focal weakness and weakness.  Psychiatric/Behavioral: Negative for depression and hallucinations. The patient is not nervous/anxious.    Tolerating Diet: Yes Tolerating PT: Pending  DRUG ALLERGIES:   Allergies  Allergen Reactions  . Asa [Aspirin]   . 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 Plavix [Clopidogrel]     VITALS:  Blood pressure 138/66, pulse 86, temperature 98.3 F (36.8 C), temperature source Oral, resp. rate 16, height 5\' 4"  (1.626 m), weight 51.6 kg (113 lb 12.1 oz), last menstrual period 03/30/1988, SpO2 97 %.  PHYSICAL EXAMINATION:   Physical Exam  GENERAL:  65 y.o.-year-old patient lying in the bed with no acute distress.  EYES: Pupils equal, round, reactive to light and accommodation. No scleral icterus. Extraocular muscles intact.  HEENT: Head atraumatic, normocephalic. Oropharynx and nasopharynx clear.  NECK:  Supple, no jugular venous distention. No thyroid enlargement, no tenderness.  LUNGS: Distant breath  sounds bilaterally, no wheezing, rales, rhonchi. No use of accessory muscles of respiration.  CARDIOVASCULAR: S1, S2 normal. No murmurs, rubs, or gallops.  ABDOMEN: Soft, nontender, nondistended. Bowel sounds present. No organomegaly or mass.  EXTREMITIES: No cyanosis, clubbing or edema b/l.   Right groin with wound vac NEUROLOGIC: Cranial nerves II through XII are intact. No focal Motor or sensory deficits b/l.   PSYCHIATRIC:  patient is alert and oriented x 3.  SKIN: No obvious rash, lesion, or ulcer.   LABORATORY PANEL:  CBC  Recent Labs Lab 05/29/15 0451  WBC 6.1  HGB 6.7*  HCT 19.5*  PLT 221    Chemistries   Recent Labs Lab 05/29/15 0451  NA 138  K 4.3  CL 102  CO2 31  GLUCOSE 87  BUN 12  CREATININE 2.61*  CALCIUM 8.4*  MG 1.9   Cardiac Enzymes No results for input(s): TROPONINI in the last 168 hours. RADIOLOGY:  Dg Chest 2 View  05/28/2015  CLINICAL DATA:  65 year old female with history of shortness breath this morning. EXAM: CHEST  2 VIEW COMPARISON:  Chest x-ray 05/20/2015. FINDINGS: Right internal jugular PermCath with tips terminating in the mid and distal superior vena cava. Moderate bilateral pleural effusions layering dependently. Associated with this there are bibasilar opacities which are presumably areas of passive atelectasis. Remaining portions of the lungs are otherwise well aerated. Mild diffuse peribronchial cuffing. No evidence of pulmonary edema. Heart size is normal. Upper mediastinal contours are within normal limits. Atherosclerosis in the thoracic aorta. Vascular stent in the mid abdomen incompletely visualize (likely within the abdominal aorta). IMPRESSION: 1. Moderate bilateral pleural effusions likely  with bibasilar passive subsegmental atelectasis. 2. Diffuse peribronchial cuffing, which could be seen in the setting of acute bronchitis. 3. Atherosclerosis. Electronically Signed   By: Vinnie Langton M.D.   On: 05/28/2015 15:50   ASSESSMENT AND  PLAN:  [name] is a 65 y.o. female with a known history of chronic diastolic congestive heart failure, incisional disease on hemodialysis, history of HIT,, anemia of chronic disease, history of right groin seroma after vascular procedures status post wound VAC placement in January 2017 comes to the emergency room from [place] with fever of 102.3 shortness of breath. Patient was found to have sepsis with right lower lobe pneumonia. She was found to be hypoxic with sats 72% on ABG.  1. Sepsis secondary to bilateral  lobe pneumonia hospital-acquired (patient was admitted and discharge in January 2017) and severe right groin infection of seroma recurrent -wound cx 2/24 with pseudomonas and VRE, ID recommending Daptomycin and Ceftazidime q 48 hrs durin HD - WOUND VAC -Uncertain if the grafts are infected but if so would need long term suppressive abx, ID will d/w dr.Schnier.  -Pulmonary consultation appreciated. -f/u with Dr. Murlean Iba   from vascular surgery , s/p I and D and wound VAC 2/24, vascular  is recommending to continue dressing changes on wound VAC 2-3 days inpatient as she had a few episodes of bleeding in the groin area -Appreciated ID consult regarding right groin infection  2. Acute on chronic diastolic congestive heart failure -Elevated BNP with shortness of breath and pulmonary edema as noted on chest x-ray-clinically improved - on hemodialysis with ultrafiltration -Continue cardiac meds  3. Hypertension Continue amlodipine, metoprolol, Avapro, hydralazine  4. History of HIT -Avoid antiplatelet agents -SCD and teds for DVT prophylaxis  5. End-stage renal disease on hemodialysis -Nephrology consultation for in-house HD  6. Hyperlipidemia continue statins  7. Anemia of chronic disease  Hemoglobin is 6.7 we will transfuse 1 unit of blood   8. Bilateral Pleural effusions with anasarca noted on CT abdomen -We will get CT chest to evaluate pleural  effusions  Case discussed with Care Management/Social Worker. Management plans discussed with the patient, family and they are in agreement.   CODE STATUS: Full  DVT Prophylaxis: SCD and teds  TOTAL TIME TAKING CARE OF THIS PATIENT: 35 minutes.  >50% time spent on counselling and coordination of care  D/C  DEPENDING ON CLINICAL CONDITION TO [facility]  Note: This dictation was prepared with Dragon dictation along with smaller phrase technology. Any transcriptional errors that result from this process are unintentional.  Nicholes Mango M.D on 05/29/2015 at 4:59 PM  Between 7am to 6pm - Pager - [phone]  After 6pm go to [email or web address] - password [email or web address] [place] [facility]  Office  [phone]  CC: Primary care physician; Arnette Norris, MD

## 2015-05-29 NOTE — Progress Notes (Addendum)
Pt had family member come out to desk saying that she felt like she was having trouble breathing. Pt stated that chest felt tight but she was not having pain. Pulse ox was very low upon nurses arrival into the room. Pt placed on non rebreather and sats increased to high 90s. Pt had elevated BPs in the [id number] systolic at this time. Respiratory consulted to come see pt. Per dr. Fritzi Mandes place order for blood gases to be drawn as well as a dose of 20mg  IV lasix to be adminstered. Will call Dr. Posey Pronto with results of Blood gases.

## 2015-05-29 NOTE — Progress Notes (Signed)
Subjective:  Patient had hemodialysis yesterday. Complains of pain in bilateral lower extremities. Resting in bed.   Objective:  Vital signs in last 24 hours:  Temp:  [97.9 F (36.6 C)-98.4 F (36.9 C)] 97.9 F (36.6 C) (03/01 0600) Pulse Rate:  [81-107] 91 (03/01 0928) Resp:  [14-26] 18 (03/01 0600) BP: (129-157)/(60-74) 135/61 mmHg (03/01 0928) SpO2:  [95 %-100 %] 98 % (03/01 0600) Weight:  [51.6 kg (113 lb 12.1 oz)] 51.6 kg (113 lb 12.1 oz) (02/28 1351)  Weight change:  Filed Weights   05/27/15 0632 05/28/15 1000 05/28/15 1351  Weight: 56.155 kg (123 lb 12.8 oz) 53 kg (116 lb 13.5 oz) 51.6 kg (113 lb 12.1 oz)    Intake/Output:    Intake/Output Summary (Last 24 hours) at 05/29/15 1118 Last data filed at 05/29/15 0720  Gross per 24 hour  Intake      0 ml  Output   1500 ml  Net  -1500 ml     Physical Exam: General: no acute distress, lying in the bed  HEENT Anicteric  Neck supple  Pulm/lungs Clear b/l  CVS/Heart Irregular rhythm, no rubs  Abdomen:  Soft, nontender  Extremities: Trace periphearl edema, wound vac in right groin  Neurologic: Alert, oriented x 3, follows commands  Skin: Scattered ecchymosis  Access: Right IJ PermCath       Basic Metabolic Panel:   Recent Labs Lab 05/24/15 0659  05/25/15 0709 05/26/15 1100 05/27/15 0440 05/28/15 0400 05/29/15 0451  NA 139  --  134* 136 137  --  138  K 3.1*  < > 3.1* 3.5 3.4* 3.7 4.3  CL 99*  --  97* 99* 99*  --  102  CO2 32  --  29 30 29   --  31  GLUCOSE 94  --  82 95 94  --  87  BUN 14  --  18 13 19   --  12  CREATININE 2.41*  --  3.31* 2.54* 3.23*  --  2.61*  CALCIUM 7.9*  --  7.7* 7.9* 8.2*  --  8.4*  MG  --   --   --  1.7 2.0  --  1.9  PHOS  --   --   --   --   --   --  2.9  < > = values in this interval not displayed.   CBC:  Recent Labs Lab 05/24/15 0659 05/27/15 0440  WBC 6.2 6.6  HGB 7.4* 6.7*  HCT 21.8* 19.9*  MCV 98.0 95.6  PLT 204 238      Microbiology:  Recent Results  (from the past 720 hour(s))  Wound culture     Status: None   Collection Time: 05/20/15 12:25 PM  Result Value Ref Range Status   Specimen Description GROIN RIGHT  Final   Special Requests NONE  Final   Gram Stain   Final    FEW WBC SEEN FEW GRAM NEGATIVE RODS RARE GRAM POSITIVE COCCI    Culture   Final    RARE PSEUDOMONAS AERUGINOSA LIGHT GROWTH VANCOMYCIN RESISTANT ENTEROCOCCUS    Report Status 05/25/2015 FINAL  Final   Organism ID, Bacteria PSEUDOMONAS AERUGINOSA  Final   Organism ID, Bacteria VANCOMYCIN RESISTANT ENTEROCOCCUS  Final      Susceptibility   Pseudomonas aeruginosa - MIC*    CEFTAZIDIME 4 SENSITIVE Sensitive     CIPROFLOXACIN 1 SENSITIVE Sensitive     GENTAMICIN <=1 SENSITIVE Sensitive     IMIPENEM 1 SENSITIVE Sensitive  CEFEPIME 4 SENSITIVE Sensitive     * RARE PSEUDOMONAS AERUGINOSA   Vancomycin resistant enterococcus - MIC*    AMPICILLIN Value in next row Resistant      RESISTANT>=32    VANCOMYCIN Value in next row Resistant      RESISTANT>=32    GENTAMICIN SYNERGY Value in next row Sensitive      RESISTANT>=32    LINEZOLID Value in next row Sensitive      SENSITIVE2    * LIGHT GROWTH VANCOMYCIN RESISTANT ENTEROCOCCUS  MRSA PCR Screening     Status: None   Collection Time: 05/20/15 12:30 PM  Result Value Ref Range Status   MRSA by PCR NEGATIVE NEGATIVE Final    Comment:        The GeneXpert MRSA Assay (FDA approved for NASAL specimens only), is one component of a comprehensive MRSA colonization surveillance program. It is not intended to diagnose MRSA infection nor to guide or monitor treatment for MRSA infections.   Anaerobic culture     Status: None   Collection Time: 05/24/15  8:31 PM  Result Value Ref Range Status   Specimen Description GROIN RIGHT  Final   Special Requests NONE  Final   Culture NO ANAEROBES ISOLATED  Final   Report Status 05/28/2015 FINAL  Final  Wound culture     Status: None   Collection Time: 05/24/15  9:13 PM   Result Value Ref Range Status   Specimen Description GROIN RIGHT  Final   Special Requests NONE  Final   Gram Stain FEW WBC SEEN MANY GRAM POSITIVE COCCI IN PAIRS   Final   Culture HEAVY GROWTH VANCOMYCIN RESISTANT ENTEROCOCCUS  Final   Report Status 05/27/2015 FINAL  Final   Organism ID, Bacteria VANCOMYCIN RESISTANT ENTEROCOCCUS  Final      Susceptibility   Vancomycin resistant enterococcus - MIC*    AMPICILLIN >=32 RESISTANT Resistant     VANCOMYCIN >=32 RESISTANT Resistant     GENTAMICIN SYNERGY SENSITIVE Sensitive     LINEZOLID 2 SENSITIVE Sensitive     * HEAVY GROWTH VANCOMYCIN RESISTANT ENTEROCOCCUS    Coagulation Studies: No results for input(s): LABPROT, INR in the last 72 hours.  Urinalysis: No results for input(s): COLORURINE, LABSPEC, PHURINE, GLUCOSEU, HGBUR, BILIRUBINUR, KETONESUR, PROTEINUR, UROBILINOGEN, NITRITE, LEUKOCYTESUR in the last 72 hours.  Invalid input(s): APPERANCEUR    Imaging: Dg Chest 2 View  05/28/2015  CLINICAL DATA:  65 year old female with history of shortness breath this morning. EXAM: CHEST  2 VIEW COMPARISON:  Chest x-ray 05/20/2015. FINDINGS: Right internal jugular PermCath with tips terminating in the mid and distal superior vena cava. Moderate bilateral pleural effusions layering dependently. Associated with this there are bibasilar opacities which are presumably areas of passive atelectasis. Remaining portions of the lungs are otherwise well aerated. Mild diffuse peribronchial cuffing. No evidence of pulmonary edema. Heart size is normal. Upper mediastinal contours are within normal limits. Atherosclerosis in the thoracic aorta. Vascular stent in the mid abdomen incompletely visualize (likely within the abdominal aorta). IMPRESSION: 1. Moderate bilateral pleural effusions likely with bibasilar passive subsegmental atelectasis. 2. Diffuse peribronchial cuffing, which could be seen in the setting of acute bronchitis. 3. Atherosclerosis.  Electronically Signed   By: Vinnie Langton M.D.   On: 05/28/2015 15:50     Medications:     . acetaminophen  1,000 mg Oral 3 times per day  . antiseptic oral rinse  7 mL Mouth Rinse BID  . aspirin EC  81 mg Oral Daily  .  atorvastatin  40 mg Oral Daily  . cefTAZidime (FORTAZ)  IV  1 g Intravenous Q24H  . DAPTOmycin (CUBICIN)  IV  250 mg Intravenous Q T,Th,Sa-HD  . docusate sodium  100 mg Oral BID  . famotidine  20 mg Oral Q48H  . fluticasone  2 spray Each Nare Daily  . ipratropium-albuterol  3 mL Nebulization Q6H  . levothyroxine  88 mcg Oral Daily  . metoprolol  25 mg Oral BID  . mometasone-formoterol  2 puff Inhalation BID  . multivitamin  1 tablet Oral Daily  . potassium chloride  40 mEq Oral Once  . sodium chloride  1 spray Each Nare 5 X Daily   acetaminophen **OR** acetaminophen, albuterol, anticoagulant sodium citrate, bisacodyl, chlorpheniramine-HYDROcodone, guaiFENesin-dextromethorphan, HYDROcodone-acetaminophen, loperamide, magnesium hydroxide, morphine injection, ondansetron **OR** ondansetron (ZOFRAN) IV, ondansetron, promethazine, senna-docusate  Assessment/ Plan:  65 y.o. female with COPD, hypertension, hyperlipidemia, vaginal intraepithelial neoplasia, emergent AAA repair status post rupture on November 18, left to right femoral to femoral bypass , acute renal failure leading to permanent dialysis, significant 3 vessel disease,   1. End-stage renal disease. [place]. TTS. [facility] nephrology - Patient had hemodialysis yesterday. No urgent indication for dialysis today. Next hematemesis tomorrow.  2. SOB - Pulmonary edema contributing - Has improved with ultrafiltration. Continue to clinically monitor.   3. Hypertension and peripheral edema:  - Blood pressure 135/61 this a.m. Continue metoprolol.  4. Anemia of chronic kidney disease:   - Most recent hemoglobin was low at 6.7.  We will recheck CBC today. If hemoglobin remains less than 7 we would recommend  transfer effusion of 1 unit PRBC. Defer this to the hospitalist however.  5. Secondary Hyperparathyroidism:   - monitor phos during admission.  6. 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   Pack cathter with citrate  7. Hypokalemia - K currently 4.3 post dialysis.     LOS: [employer], [name] 3/1/201711:18 AM

## 2015-05-29 NOTE — Progress Notes (Signed)
Responded to rapid response with Pt. In CT scan.  Pt. Tachypneic respirations 35 with SpO2 70% on Ventimask. Pt. Placed on 100% nonrebreathing mask with little improvement.  HR 150's . Pt. Placed on BIPAP and transferred to ICU. SpO2 improved to 96% on BIPAP 15/5 FiO2 100%. Pt. Less tachypneic with improved work of breathing. Dr. Jannifer Franklin in to see Pt. Will continue to monitor progress.

## 2015-05-29 NOTE — Progress Notes (Signed)
[facility]  Daily Progress Note   Subjective  - 5 Days Post-Op  VAC has been functioning properly all day. Patient denies pain  Objective Filed Vitals:   05/29/15 1518 05/29/15 1548 05/29/15 1756 05/29/15 1807  BP: 139/56 138/66 177/85 160/75  Pulse: 92 86  103  Temp: 98.5 F (36.9 C) 98.3 F (36.8 C)    TempSrc: Oral Oral    Resp: 16 16    Height:      Weight:      SpO2: 97% 97%  100%    Intake/Output Summary (Last 24 hours) at 05/29/15 1810 Last data filed at 05/29/15 1533  Gross per 24 hour  Intake      8 ml  Output    125 ml  Net   -117 ml    PULM  Normal effort , no use of accessory muscles CV  No JVD, RRR Abd      No distended, nontender VASC  right groin now with intact VAC wound both feet pink warm and well perfused  Laboratory CBC    Component Value Date/Time   WBC 6.1 05/29/2015 0451   WBC 9.3 07/19/2014 0841   HGB 6.7* 05/29/2015 0451   HGB 10.8* 07/19/2014 0841   HCT 19.5* 05/29/2015 0451   HCT 33.7* 07/19/2014 0841   PLT 221 05/29/2015 0451   PLT 237 07/19/2014 0841    BMET    Component Value Date/Time   NA 138 05/29/2015 0451   NA 135 07/22/2014 0645   K 4.3 05/29/2015 0451   K 3.7 07/22/2014 0645   CL 102 05/29/2015 0451   CL 112* 07/22/2014 0645   CO2 31 05/29/2015 0451   CO2 18* 07/22/2014 0645   GLUCOSE 87 05/29/2015 0451   GLUCOSE 77 07/22/2014 0645   BUN 12 05/29/2015 0451   BUN 24* 07/22/2014 0645   CREATININE 2.61* 05/29/2015 0451   CREATININE 1.00 07/22/2014 0645   CALCIUM 8.4* 05/29/2015 0451   CALCIUM 8.3* 07/22/2014 0645   GFRNONAA 18* 05/29/2015 0451   GFRNONAA 59* 07/22/2014 0645   GFRNONAA 58* 05/07/2014 1302   GFRAA 21* 05/29/2015 0451   GFRAA >60 07/22/2014 0645   GFRAA >60 05/07/2014 1302    Assessment/Planning:  I will continue VAC even after discharge my plan will be for utilization of the VAC until the wound is healed. I appreciate both medicine's help as well as infectious  disease. I have discussed antibiotics with Dr. Ola Spurr today. I have also reviewed the timing of placing a brachial axillary dialysis graft in her left arm. Dr. Ola Spurr was in agreement with moving forward in approximately 2 weeks. Antibiotics have been adjusted and hopefully we can transition to a sniff this Thursday or Friday   [name]  05/29/2015, 6:10 PM

## 2015-05-30 ENCOUNTER — Inpatient Hospital Stay: Payer: Medicare Other

## 2015-05-30 DIAGNOSIS — L8990 Pressure ulcer of unspecified site, unspecified stage: Secondary | ICD-10-CM | POA: Insufficient documentation

## 2015-05-30 LAB — TYPE AND SCREEN
ABO/RH(D): B POS
ANTIBODY SCREEN: NEGATIVE
Unit division: 0

## 2015-05-30 LAB — CBC
HEMATOCRIT: 23.8 % — AB (ref 35.0–47.0)
HEMOGLOBIN: 8.3 g/dL — AB (ref 12.0–16.0)
MCH: 32 pg (ref 26.0–34.0)
MCHC: 34.9 g/dL (ref 32.0–36.0)
MCV: 91.5 fL (ref 80.0–100.0)
Platelets: 238 10*3/uL (ref 150–440)
RBC: 2.6 MIL/uL — AB (ref 3.80–5.20)
RDW: 19.5 % — ABNORMAL HIGH (ref 11.5–14.5)
WBC: 9.6 10*3/uL (ref 3.6–11.0)

## 2015-05-30 LAB — PHOSPHORUS: Phosphorus: 4.1 mg/dL (ref 2.5–4.6)

## 2015-05-30 LAB — BASIC METABOLIC PANEL
Anion gap: 7 (ref 5–15)
BUN: 21 mg/dL — AB (ref 6–20)
CHLORIDE: 102 mmol/L (ref 101–111)
CO2: 29 mmol/L (ref 22–32)
CREATININE: 3.46 mg/dL — AB (ref 0.44–1.00)
Calcium: 8.4 mg/dL — ABNORMAL LOW (ref 8.9–10.3)
GFR calc Af Amer: 15 mL/min — ABNORMAL LOW (ref >60)
GFR calc non Af Amer: 13 mL/min — ABNORMAL LOW (ref >60)
Glucose, Bld: 96 mg/dL (ref 65–99)
POTASSIUM: 4.8 mmol/L (ref 3.5–5.1)
Sodium: 138 mmol/L (ref 135–145)

## 2015-05-30 LAB — MAGNESIUM: Magnesium: 1.9 mg/dL (ref 1.7–2.4)

## 2015-05-30 MED ORDER — IPRATROPIUM-ALBUTEROL 0.5-2.5 (3) MG/3ML IN SOLN
3.0000 mL | Freq: Three times a day (TID) | RESPIRATORY_TRACT | Status: DC
  Administered 2015-05-31 – 2015-06-01 (×4): 3 mL via RESPIRATORY_TRACT
  Filled 2015-05-30 (×4): qty 3

## 2015-05-30 MED ORDER — ALBUMIN HUMAN 25 % IV SOLN
25.0000 g | Freq: Once | INTRAVENOUS | Status: AC
  Administered 2015-05-30: 25 g via INTRAVENOUS
  Filled 2015-05-30: qty 100

## 2015-05-30 MED ORDER — ALPRAZOLAM 0.5 MG PO TABS
0.5000 mg | ORAL_TABLET | Freq: Once | ORAL | Status: AC
  Administered 2015-05-30: 0.5 mg via ORAL
  Filled 2015-05-30: qty 1

## 2015-05-30 NOTE — Progress Notes (Signed)
MEDICATION RELATED CONSULT NOTE - Follow Up  Pharmacy Consult for Potassium  Indication: Potassium replacement.    Allergies  Allergen Reactions  . Asa [Aspirin]   . 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 Plavix [Clopidogrel]     Patient Measurements: Height: 5\' 4"  (162.6 cm) Weight: 113 lb 12.1 oz (51.6 kg) IBW/kg (Calculated) : 54.7  Vital Signs: Temp: 98.5 F (36.9 C) (03/01 1857) Temp Source: Oral (03/01 1857) BP: 124/65 mmHg (03/02 0600) Pulse Rate: 78 (03/02 0600) Intake/Output from previous day: 03/01 0701 - 03/02 0700 In: 343 [I.V.:8; Blood:335] Out: 125 [Drains:125] Intake/Output from this shift:    Labs:  Recent Labs  05/29/15 0451 05/30/15 0448  WBC 6.1 9.6  HGB 6.7* 8.3*  HCT 19.5* 23.8*  PLT 221 238  CREATININE 2.61* 3.46*  MG 1.9 1.9  PHOS 2.9 4.1   Estimated Creatinine Clearance: 13 mL/min (by C-G formula based on Cr of 3.46).   Microbiology: Recent Results (from the past 720 hour(s))  Wound culture     Status: None   Collection Time: 05/20/15 12:25 PM  Result Value Ref Range Status   Specimen Description GROIN RIGHT  Final   Special Requests NONE  Final   Gram Stain   Final    FEW WBC SEEN FEW GRAM NEGATIVE RODS RARE GRAM POSITIVE COCCI    Culture   Final    RARE PSEUDOMONAS AERUGINOSA LIGHT GROWTH VANCOMYCIN RESISTANT ENTEROCOCCUS    Report Status 05/25/2015 FINAL  Final   Organism ID, Bacteria PSEUDOMONAS AERUGINOSA  Final   Organism ID, Bacteria VANCOMYCIN RESISTANT ENTEROCOCCUS  Final      Susceptibility   Pseudomonas aeruginosa - MIC*    CEFTAZIDIME 4 SENSITIVE Sensitive     CIPROFLOXACIN 1 SENSITIVE Sensitive     GENTAMICIN <=1 SENSITIVE Sensitive     IMIPENEM 1 SENSITIVE Sensitive     CEFEPIME 4 SENSITIVE Sensitive     * RARE PSEUDOMONAS AERUGINOSA   Vancomycin resistant enterococcus - MIC*    AMPICILLIN Value in next row Resistant      RESISTANT>=32    VANCOMYCIN Value in next row Resistant    RESISTANT>=32    GENTAMICIN SYNERGY Value in next row Sensitive      RESISTANT>=32    LINEZOLID Value in next row Sensitive      SENSITIVE2    * LIGHT GROWTH VANCOMYCIN RESISTANT ENTEROCOCCUS  MRSA PCR Screening     Status: None   Collection Time: 05/20/15 12:30 PM  Result Value Ref Range Status   MRSA by PCR NEGATIVE NEGATIVE Final    Comment:        The GeneXpert MRSA Assay (FDA approved for NASAL specimens only), is one component of a comprehensive MRSA colonization surveillance program. It is not intended to diagnose MRSA infection nor to guide or monitor treatment for MRSA infections.   Anaerobic culture     Status: None   Collection Time: 05/24/15  8:31 PM  Result Value Ref Range Status   Specimen Description GROIN RIGHT  Final   Special Requests NONE  Final   Culture NO ANAEROBES ISOLATED  Final   Report Status 05/28/2015 FINAL  Final  Wound culture     Status: None   Collection Time: 05/24/15  9:13 PM  Result Value Ref Range Status   Specimen Description GROIN RIGHT  Final   Special Requests NONE  Final   Gram Stain FEW WBC SEEN MANY GRAM POSITIVE COCCI IN PAIRS  Final   Culture HEAVY GROWTH VANCOMYCIN RESISTANT ENTEROCOCCUS  Final   Report Status 05/27/2015 FINAL  Final   Organism ID, Bacteria VANCOMYCIN RESISTANT ENTEROCOCCUS  Final      Susceptibility   Vancomycin resistant enterococcus - MIC*    AMPICILLIN >=32 RESISTANT Resistant     VANCOMYCIN >=32 RESISTANT Resistant     GENTAMICIN SYNERGY SENSITIVE Sensitive     LINEZOLID 2 SENSITIVE Sensitive     * HEAVY GROWTH VANCOMYCIN RESISTANT ENTEROCOCCUS    Medical History: Past Medical History  Diagnosis Date  . Hyperlipidemia   . Hypertension   . Thyroid disease   . GERD (gastroesophageal reflux disease)   . Aneurysm ([place])     abdominal aortic  . STD (sexually transmitted disease)     chlamydia  . VAIN II (vaginal intraepithelial neoplasia grade II) 7/09  . Granuloma annulare 2010    skin- sees  dermatologist  . B12 deficiency   . COPD (chronic obstructive pulmonary disease) ([place])   . Renal insufficiency   . Cancer ([place])     skin on left ankle 04/10/15 pt states she has never had cancer    Medications:  Prescriptions prior to admission  Medication Sig Dispense Refill Last Dose  . acetaminophen (TYLENOL) 500 MG tablet Take 1,000 mg by mouth every 8 (eight) hours.   unknown at unknown  . albuterol (PROVENTIL) (2.5 MG/3ML) 0.083% nebulizer solution Take 2.5 mg by nebulization every 6 (six) hours as needed for wheezing.   prn at prn  . amLODipine (NORVASC) 5 MG tablet Take 1 tablet (5 mg total) by mouth daily. 30 tablet 6 unknown at unknown  . aspirin EC 81 MG tablet Take 81 mg by mouth daily.   unknown at unknown  . atorvastatin (LIPITOR) 40 MG tablet Take 40 mg by mouth daily.   unknown at unknown  . cefTRIAXone (ROCEPHIN) 1-3.74 GM-% IVPB Inject 1 g into the vein daily.   unknown at unknown  . chlorpheniramine-HYDROcodone (TUSSIONEX) 10-8 MG/5ML SUER Take 5 mLs by mouth every 12 (twelve) hours as needed for cough. 140 mL 0 prn at prn  . docusate sodium (COLACE) 100 MG capsule Take 100 mg by mouth 2 (two) times daily.    unknown at unknown  . doxycycline (VIBRA-TABS) 100 MG tablet Take 100 mg by mouth 2 (two) times daily. For 14 days   unknown at unknown  . famotidine (PEPCID) 20 MG tablet Take 20 mg by mouth daily.   unknown at unknown  . fluticasone (FLONASE) 50 MCG/ACT nasal spray Place 2 sprays into both nostrils daily.   unknown at unknown  . fluticasone furoate-vilanterol (BREO ELLIPTA) 100-25 MCG/INH AEPB Inhale 1 puff into the lungs daily.   unknown at unknown  . guaiFENesin-dextromethorphan (ROBITUSSIN DM) 100-10 MG/5ML syrup Take 10 mLs by mouth every 4 (four) hours as needed for cough. 118 mL 0 prn at prn  . hydrALAZINE (APRESOLINE) 10 MG tablet Take 10 mg by mouth every 6 (six) hours.   unknown at unknown  . irbesartan (AVAPRO) 75 MG tablet Take 1 tablet (75 mg total) by  mouth 2 (two) times daily. 30 tablet 5 unknown at unknown  . levothyroxine (SYNTHROID, LEVOTHROID) 88 MCG tablet Take 88 mcg by mouth daily.   unknown at unknown  . loperamide (IMODIUM) 2 MG capsule Take 2 mg by mouth every 4 (four) hours as needed for diarrhea or loose stools.   prn at prn  . magnesium hydroxide (MILK OF MAGNESIA) 400 MG/5ML suspension  Take 30 mLs by mouth daily as needed for mild constipation.   prn at prn  . metoprolol (LOPRESSOR) 50 MG tablet Take 1 tablet (50 mg total) by mouth every 8 (eight) hours. 90 tablet 5 unknown at unknown  . multivitamin (RENA-VIT) TABS tablet Take 1 tablet by mouth daily.   unknown at unknown  . ondansetron (ZOFRAN) 4 MG tablet Take 4 mg by mouth every 6 (six) hours as needed for nausea or vomiting.   prn at prn  . promethazine (PHENERGAN) 25 MG/ML injection Inject 25 mg into the vein every 6 (six) hours as needed for nausea or vomiting.   prn at prn  . sodium chloride (OCEAN) 0.65 % SOLN nasal spray Place 1 spray into both nostrils 5 (five) times daily.   unknown at unknown    Assessment: Pharmacy consulted to assist in managing electrolytes in this 65 y/o F admitted with sepsis due to B/L PNA.   K= 3.4, Mag= 2.0  Goal of Therapy:  K :  3.5 - 5.1   Plan:  Electrolytes WNL at this time Will recheck electrolytes tomorrow with AM labs.  Laural Benes, Pharm.D., BCPS Clinical Pharmacist 05/30/2015,6:31 AM

## 2015-05-30 NOTE — Progress Notes (Signed)
[facility] [place] NAME: [name]    MR#:  [id number]  DATE OF BIRTH:  [birth year]  SUBJECTIVE:  Patient was seen and examined in ICU today. Patient went into rapid response while getting CAT scan of the chest yesterday and blood pressure was elevated.  Today she is resting comfortably. Denies any shortness of breath while resting, willing to get CAT scan of the chest today after dialysis  REVIEW OF SYSTEMS:   Review of Systems  Constitutional: Negative for fever, chills and weight loss.  HENT: Negative for ear discharge, ear pain and nosebleeds.   Eyes: Negative for blurred vision, pain, discharge and redness.  Respiratory: Negative for sputum production, shortness of breath, wheezing and stridor.   Cardiovascular: Negative for chest pain, palpitations, orthopnea and PND.  Gastrointestinal: Negative for nausea, vomiting, abdominal pain and diarrhea.  Genitourinary: Negative for urgency and frequency.  Musculoskeletal: Negative for back pain and joint pain.  Skin:       Right groin with wound VAC  Neurological: Negative for sensory change, speech change, focal weakness and weakness.  Psychiatric/Behavioral: Negative for depression and hallucinations. The patient is not nervous/anxious.    Tolerating Diet: Yes Tolerating PT: Pending  DRUG ALLERGIES:   Allergies  Allergen Reactions  . Asa [Aspirin]   . 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 Plavix [Clopidogrel]     VITALS:  Blood pressure 143/65, pulse 86, temperature 98 F (36.7 C), temperature source Oral, resp. rate 14, height 5\' 4"  (1.626 m), weight 53.6 kg (118 lb 2.7 oz), last menstrual period 03/30/1988, SpO2 100 %.  PHYSICAL EXAMINATION:   Physical Exam  GENERAL:  65 y.o.-year-old patient lying in the bed with no acute distress.  EYES: Pupils equal, round, reactive to light and accommodation. No scleral icterus. Extraocular muscles  intact.  HEENT: Head atraumatic, normocephalic. Oropharynx and nasopharynx clear.  NECK:  Supple, no jugular venous distention. No thyroid enlargement, no tenderness.  LUNGS: Distant breath sounds bilaterally, no wheezing, rales, rhonchi. No use of accessory muscles of respiration.  CARDIOVASCULAR: S1, S2 normal. No murmurs, rubs, or gallops.  ABDOMEN: Soft, nontender, nondistended. Bowel sounds present. No organomegaly or mass.  EXTREMITIES: No cyanosis, clubbing or edema b/l.   Right groin with wound vac NEUROLOGIC: Cranial nerves II through XII are intact. No focal Motor or sensory deficits b/l.   PSYCHIATRIC:  patient is alert and oriented x 3.  SKIN: No obvious rash, lesion, or ulcer.   LABORATORY PANEL:  CBC  Recent Labs Lab 05/30/15 0448  WBC 9.6  HGB 8.3*  HCT 23.8*  PLT 238    Chemistries   Recent Labs Lab 05/30/15 0448  NA 138  K 4.8  CL 102  CO2 29  GLUCOSE 96  BUN 21*  CREATININE 3.46*  CALCIUM 8.4*  MG 1.9   Cardiac Enzymes No results for input(s): TROPONINI in the last 168 hours. RADIOLOGY:  Ct Chest Wo Contrast  05/30/2015  CLINICAL DATA:  Shortness of Breath EXAM: CT CHEST WITHOUT CONTRAST TECHNIQUE: Multidetector CT imaging of the chest was performed following the standard protocol without IV contrast. COMPARISON:  05/29/2015, 07/19/2014 FINDINGS: Bilateral large pleural effusions are identified. Mild interstitial thickening is noted likely related to a degree of edema. Some lingular infiltrate as well as bilateral lower lobe consolidation is noted. No focal parenchymal nodule is seen. The thoracic inlet shows evidence of a right jugular dialysis catheter. Aortic calcification  is noted as well as of its branches. No aneurysmal dilatation is seen. Coronary calcifications are noted. No hilar or mediastinal adenopathy is noted at this time although the lack of IV contrast somewhat limits the exam. The visualized upper abdomen is within normal limits. There are  changes consistent with an aortic stent graft also incompletely evaluated with the lack of IV contrast. The bony structures show no acute abnormality. IMPRESSION: Bilateral pleural effusions with associated lower lobe consolidation and multiple lingular infiltrate. Electronically Signed   By: Inez Catalina M.D.   On: 05/30/2015 14:22   Dg Chest Port 1 View  05/29/2015  CLINICAL DATA:  65 year old female with shortness of breath EXAM: PORTABLE CHEST 1 VIEW COMPARISON:  Radiograph dated 05/28/2015 FINDINGS: Right IJ dialysis catheter in stable positioning. There are small bilateral pleural effusions, right greater than left, with no significant interval change compared to prior study. Bilateral lower lung base airspace densities appear also similar to prior study and may represent atelectatic changes or pneumonia. There is no pneumothorax. The cardiac silhouette is within normal limits. No acute osseous pathology. IMPRESSION: No significant interval change in the bilateral pleural effusions and bibasilar airspace densities. Clinical correlation and follow-up recommended. Electronically Signed   By: Anner Crete M.D.   On: 05/29/2015 21:19   ASSESSMENT AND PLAN:  [name] is a 65 y.o. female with a known history of chronic diastolic congestive heart failure, incisional disease on hemodialysis, history of HIT,, anemia of chronic disease, history of right groin seroma after vascular procedures status post wound VAC placement in January 2017 comes to the emergency room from [place] with fever of 102.3 shortness of breath. Patient was found to have sepsis with right lower lobe pneumonia. She was found to be hypoxic with sats 72% on ABG.  #Acute respiratory distress probably from pleural effusions Resolved  #. Sepsis secondary to bilateral  lobe pneumonia hospital-acquired (patient was admitted and discharge in January 2017) and severe right groin infection of seroma recurrent -CT chest  with large bilateral pleural effusions, ordered thoracocentesis ultrasound-guided for tomorrow -wound cx 2/24 with pseudomonas and VRE, ID recommending Daptomycin and Ceftazidime q 48 hrs durin HD - WOUND VAC -Uncertain if the grafts are infected but if so would need long term suppressive abx, ID will d/w dr.Schnier.  -Pulmonary consultation appreciated. -f/u with Dr. Murlean Iba   from vascular surgery , s/p I and D and wound VAC 2/24, vascular  is recommending to continue dressing changes on wound VAC 2-3 days inpatient as she had a few episodes of bleeding in the groin area, will follow up with vascular surgery -Appreciated ID consult regarding right groin infection  2. Acute on chronic diastolic congestive heart failure with large bilateral pleural effusions -Elevated BNP with shortness of breath and pulmonary edema as noted on chest x-ray-clinically improved - on hemodialysis with ultrafiltration -Continue cardiac meds  3. Hypertension Continue amlodipine, metoprolol, Avapro, hydralazine  4. History of HIT -Avoid antiplatelet agents -SCD and teds for DVT prophylaxis  5. End-stage renal disease on hemodialysis -Nephrology consultation for in-house HD  6. Hyperlipidemia continue statins  7. Anemia of chronic disease  Hemoglobin is 6.7 -8.3, s/p 1 unit of blood   8. Bilateral Pleural effusions with anasarca noted on CT abdomen -We will get CT chest to evaluate pleural effusions  Case discussed with Care Management/Social Worker. Management plans discussed with the patient, family and they are in agreement.   CODE STATUS: Full  DVT Prophylaxis: SCD and teds  TOTAL TIME TAKING CARE OF THIS PATIENT: 35 minutes.  >50% time spent on counselling and coordination of care  D/C  DEPENDING ON CLINICAL CONDITION TO [facility]  Note: This dictation was prepared with Dragon dictation along with smaller phrase technology. Any transcriptional errors that result from this process are  unintentional.  Nicholes Mango M.D on 05/30/2015 at 5:00 PM  Between 7am to 6pm - Pager - [phone]  After 6pm go to [email or web address] - password [email or web address]  Office  [phone]  CC: Primary care physician; Arnette Norris, MD

## 2015-05-30 NOTE — Clinical Social Work Note (Signed)
Patient had a decline in condition and was transferred to ICU last evening. CSW will continue to follow. Shela Leff MSW,LCSW [phone]

## 2015-05-30 NOTE — Progress Notes (Signed)
[facility]  Daily Progress Note   Subjective  - 6 Days Post-Op  VAC has been functioning properly all day. Patient denies pain  Objective Filed Vitals:   05/30/15 1315 05/30/15 1330 05/30/15 1400 05/30/15 1500  BP: 152/71 143/70 138/60 143/65  Pulse: 100 103 93 86  Temp:  98 F (36.7 C)    TempSrc:  Oral    Resp: 14 31 14 14   Height:      Weight:  53.6 kg (118 lb 2.7 oz)    SpO2: 94% 100% 100% 100%    Intake/Output Summary (Last 24 hours) at 05/30/15 1907 Last data filed at 05/30/15 1330  Gross per 24 hour  Intake      0 ml  Output   3000 ml  Net  -3000 ml    PULM  Normal effort , no use of accessory muscles CV  No JVD, RRR Abd      No distended, nontender VASC  right groin now with intact VAC wound both feet pink warm and well perfused  Laboratory CBC    Component Value Date/Time   WBC 9.6 05/30/2015 0448   WBC 9.3 07/19/2014 0841   HGB 8.3* 05/30/2015 0448   HGB 10.8* 07/19/2014 0841   HCT 23.8* 05/30/2015 0448   HCT 33.7* 07/19/2014 0841   PLT 238 05/30/2015 0448   PLT 237 07/19/2014 0841    BMET    Component Value Date/Time   NA 138 05/30/2015 0448   NA 135 07/22/2014 0645   K 4.8 05/30/2015 0448   K 3.7 07/22/2014 0645   CL 102 05/30/2015 0448   CL 112* 07/22/2014 0645   CO2 29 05/30/2015 0448   CO2 18* 07/22/2014 0645   GLUCOSE 96 05/30/2015 0448   GLUCOSE 77 07/22/2014 0645   BUN 21* 05/30/2015 0448   BUN 24* 07/22/2014 0645   CREATININE 3.46* 05/30/2015 0448   CREATININE 1.00 07/22/2014 0645   CALCIUM 8.4* 05/30/2015 0448   CALCIUM 8.3* 07/22/2014 0645   GFRNONAA 13* 05/30/2015 0448   GFRNONAA 59* 07/22/2014 0645   GFRNONAA 58* 05/07/2014 1302   GFRAA 15* 05/30/2015 0448   GFRAA >60 07/22/2014 0645   GFRAA >60 05/07/2014 1302    Assessment/Planning: Change in patient's respiratory status yesterday evening however this appears to resolve currently she is being transferred back to the floor.  I will continue  VAC even after discharge my plan will be for utilization of the VAC until the wound is healed. I appreciate both medicine's help as well as infectious disease. I have discussed antibiotics with Dr. Ola Spurr today. I have also reviewed the timing of placing a brachial axillary dialysis graft in her left arm. Dr. Ola Spurr was in agreement with moving forward in approximately 2 weeks. Antibiotics have been adjusted and hopefully we can transition to a sniff this Friday   Katha Cabal  05/30/2015, 7:07 PM

## 2015-05-30 NOTE — Progress Notes (Signed)
Pre-hd tx 

## 2015-05-30 NOTE — Progress Notes (Signed)
Hemodialysis start 

## 2015-05-30 NOTE — Consult Note (Signed)
Pharmacy Antibiotic Note  [name] is a 65 y.o. female admitted on 05/20/2015 with wound infection.  Pharmacy has been consulted for ceftazidime and daptomycin dosing.   Plan: Will continue daptomycin 300 mg IV q48h. Will need weekly CPK levels.  Will need to continue to follow HD schedule and ensure doses are given after HD.  Continue ceftazidime 1gm q24hrs.   Height: 5\' 4"  (162.6 cm) Weight: 118 lb 2.7 oz (53.6 kg) IBW/kg (Calculated) : 54.7  Temp (24hrs), Avg:98.3 F (36.8 C), Min:98 F (36.7 C), Max:98.5 F (36.9 C)   Recent Labs Lab 05/24/15 0659 05/25/15 0709 05/25/15 1056 05/26/15 1100 05/27/15 0440 05/29/15 0451 05/30/15 0448  WBC 6.2  --   --   --  6.6 6.1 9.6  CREATININE 2.41* 3.31*  --  2.54* 3.23* 2.61* 3.46*  VANCOTROUGH  --   --  12  --   --   --   --     Estimated Creatinine Clearance: 13.5 mL/min (by C-G formula based on Cr of 3.46).    Allergies  Allergen Reactions  . Asa [Aspirin]   . 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 Plavix [Clopidogrel]    2/27 CK 28  Antimicrobials this admission: Anti-infectives    Start     Dose/Rate Route Frequency Ordered Stop   05/30/15 1800  DAPTOmycin (CUBICIN) 300 mg in sodium chloride 0.9 % IVPB     300 mg 212 mL/hr over 30 Minutes Intravenous Every 48 hours 05/29/15 1118     05/28/15 1200  vancomycin (VANCOCIN) 750 mg in sodium chloride 0.9 % 100 mL IVPB  Status:  Discontinued     750 mg 100 mL/hr over 60 Minutes Intravenous Every T-Th-Sa (Hemodialysis) 05/25/15 1227 05/27/15 1110   05/28/15 1200  DAPTOmycin (CUBICIN) 250 mg in sodium chloride 0.9 % IVPB  Status:  Discontinued     250 mg 210 mL/hr over 30 Minutes Intravenous Every T-Th-Sa (Hemodialysis) 05/27/15 1114 05/29/15 1118   05/27/15 1200  DAPTOmycin (CUBICIN) 250 mg in sodium chloride 0.9 % IVPB     250 mg 210 mL/hr over 30 Minutes Intravenous  Once 05/27/15 1114 05/27/15 1444   05/27/15 1115  DAPTOmycin (CUBICIN) 250  mg in sodium chloride 0.9 % IVPB  Status:  Discontinued     250 mg 210 mL/hr over 30 Minutes Intravenous Every 24 hours 05/27/15 1111 05/27/15 1114   05/26/15 1000  cefTAZidime (FORTAZ) 1 g in dextrose 5 % 50 mL IVPB     1 g 100 mL/hr over 30 Minutes Intravenous Every 24 hours 05/25/15 0311     05/25/15 1200  vancomycin (VANCOCIN) 500 mg in sodium chloride 0.9 % 100 mL IVPB  Status:  Discontinued     500 mg 100 mL/hr over 60 Minutes Intravenous Every T-Th-Sa (Hemodialysis) 05/24/15 1311 05/25/15 1227   05/25/15 0315  cefTAZidime (FORTAZ) 1 g in dextrose 5 % 50 mL IVPB     1 g 100 mL/hr over 30 Minutes Intravenous  Once 05/25/15 0311 05/25/15 0521   05/24/15 0000  ceFAZolin (ANCEF) IVPB 1 g/50 mL premix    Comments:  Send with pt to OR   1 g 100 mL/hr over 30 Minutes Intravenous On call 05/23/15 1752 05/25/15 0000   05/21/15 1200  vancomycin (VANCOCIN) 500 mg in sodium chloride 0.9 % 100 mL IVPB  Status:  Discontinued     500 mg 100 mL/hr over 60 Minutes Intravenous Every T-Th-Sa (Hemodialysis) 05/20/15  1355 05/23/15 1522   05/20/15 1830  piperacillin-tazobactam (ZOSYN) IVPB 3.375 g  Status:  Discontinued     3.375 g 12.5 mL/hr over 240 Minutes Intravenous Every 12 hours 05/20/15 1135 05/24/15 1855   05/20/15 1145  vancomycin (VANCOCIN) 1,250 mg in sodium chloride 0.9 % 250 mL IVPB  Status:  Discontinued     1,250 mg 166.7 mL/hr over 90 Minutes Intravenous  Once 05/20/15 1130 05/20/15 1130   05/20/15 1145  vancomycin (VANCOCIN) 500 mg in sodium chloride 0.9 % 100 mL IVPB     500 mg 100 mL/hr over 60 Minutes Intravenous  Once 05/20/15 1133 05/20/15 1320   05/20/15 0830  vancomycin (VANCOCIN) IVPB 1000 mg/200 mL premix     1,000 mg 200 mL/hr over 60 Minutes Intravenous  Once 05/20/15 0817 05/20/15 1000   05/20/15 0830  piperacillin-tazobactam (ZOSYN) IVPB 3.375 g     3.375 g 12.5 mL/hr over 240 Minutes Intravenous  Once 05/20/15 0817 05/20/15 0900      Microbiology results: Results  for orders placed or performed during the hospital encounter of 05/20/15  Wound culture     Status: None   Collection Time: 05/20/15 12:25 PM  Result Value Ref Range Status   Specimen Description GROIN RIGHT  Final   Special Requests NONE  Final   Gram Stain   Final    FEW WBC SEEN FEW GRAM NEGATIVE RODS RARE GRAM POSITIVE COCCI    Culture   Final    RARE PSEUDOMONAS AERUGINOSA LIGHT GROWTH VANCOMYCIN RESISTANT ENTEROCOCCUS    Report Status 05/25/2015 FINAL  Final   Organism ID, Bacteria PSEUDOMONAS AERUGINOSA  Final   Organism ID, Bacteria VANCOMYCIN RESISTANT ENTEROCOCCUS  Final      Susceptibility   Pseudomonas aeruginosa - MIC*    CEFTAZIDIME 4 SENSITIVE Sensitive     CIPROFLOXACIN 1 SENSITIVE Sensitive     GENTAMICIN <=1 SENSITIVE Sensitive     IMIPENEM 1 SENSITIVE Sensitive     CEFEPIME 4 SENSITIVE Sensitive     * RARE PSEUDOMONAS AERUGINOSA   Vancomycin resistant enterococcus - MIC*    AMPICILLIN Value in next row Resistant      RESISTANT>=32    VANCOMYCIN Value in next row Resistant      RESISTANT>=32    GENTAMICIN SYNERGY Value in next row Sensitive      RESISTANT>=32    LINEZOLID Value in next row Sensitive      SENSITIVE2    * LIGHT GROWTH VANCOMYCIN RESISTANT ENTEROCOCCUS  MRSA PCR Screening     Status: None   Collection Time: 05/20/15 12:30 PM  Result Value Ref Range Status   MRSA by PCR NEGATIVE NEGATIVE Final    Comment:        The GeneXpert MRSA Assay (FDA approved for NASAL specimens only), is one component of a comprehensive MRSA colonization surveillance program. It is not intended to diagnose MRSA infection nor to guide or monitor treatment for MRSA infections.   Anaerobic culture     Status: None   Collection Time: 05/24/15  8:31 PM  Result Value Ref Range Status   Specimen Description GROIN RIGHT  Final   Special Requests NONE  Final   Culture NO ANAEROBES ISOLATED  Final   Report Status 05/28/2015 FINAL  Final  Wound culture     Status:  None   Collection Time: 05/24/15  9:13 PM  Result Value Ref Range Status   Specimen Description GROIN RIGHT  Final   Special Requests NONE  Final   Gram Stain FEW WBC SEEN MANY GRAM POSITIVE COCCI IN PAIRS   Final   Culture HEAVY GROWTH VANCOMYCIN RESISTANT ENTEROCOCCUS  Final   Report Status 05/27/2015 FINAL  Final   Organism ID, Bacteria VANCOMYCIN RESISTANT ENTEROCOCCUS  Final      Susceptibility   Vancomycin resistant enterococcus - MIC*    AMPICILLIN >=32 RESISTANT Resistant     VANCOMYCIN >=32 RESISTANT Resistant     GENTAMICIN SYNERGY SENSITIVE Sensitive     LINEZOLID 2 SENSITIVE Sensitive     * HEAVY GROWTH VANCOMYCIN RESISTANT ENTEROCOCCUS    Thank you for allowing pharmacy to be a part of this patient's care.  Napoleon Form 05/30/2015 2:38 PM

## 2015-05-30 NOTE — Progress Notes (Signed)
Patient floor care, received room assignment to room 221, report called to Barnabas Lister, RN with no further questions.  Patient  belonging's packed and ready to go with patient.   Patient currently in no apparent distress, patient and family updated.

## 2015-05-30 NOTE — Progress Notes (Signed)
Subjective:  Patient was transferred to ICU last night for resp distress  CT could not completed This AM she is on [place]   Objective:  Vital signs in last 24 hours:  Temp:  [98.3 F (36.8 C)-98.5 F (36.9 C)] 98.5 F (36.9 C) (03/01 1857) Pulse Rate:  [78-156] 78 (03/02 0600) Resp:  [9-26] 9 (03/02 0600) BP: (116-200)/(48-118) 124/65 mmHg (03/02 0600) SpO2:  [96 %-100 %] 100 % (03/02 0600) FiO2 (%):  [50 %-100 %] 70 % (03/02 0211)  Weight change:  Filed Weights   05/27/15 [id number] 05/28/15 1000 05/28/15 1351  Weight: 56.155 kg (123 lb 12.8 oz) 53 kg (116 lb 13.5 oz) 51.6 kg (113 lb 12.1 oz)    Intake/Output:    Intake/Output Summary (Last 24 hours) at 05/30/15 0920 Last data filed at 05/29/15 1857  Gross per 24 hour  Intake    343 ml  Output      0 ml  Net    343 ml     Physical Exam: General: no acute distress, lying in the bed  HEENT Anicteric  Neck supple  Pulm/lungs Clear b/l  CVS/Heart Irregular rhythm, no rubs  Abdomen:  Soft, nontender  Extremities: Trace periphearl edema, wound vac in right groin  Neurologic: Alert, oriented x 3, follows commands  Skin: Scattered ecchymosis  Access: Right IJ PermCath       Basic Metabolic Panel:   Recent Labs Lab 05/25/15 0709 05/26/15 1100 05/27/15 0440 05/28/15 0400 05/29/15 0451 05/30/15 0448  NA 134* 136 137  --  138 138  K 3.1* 3.5 3.4* 3.7 4.3 4.8  CL 97* 99* 99*  --  102 102  CO2 29 30 29   --  31 29  GLUCOSE 82 95 94  --  87 96  BUN 18 13 19   --  12 21*  CREATININE 3.31* 2.54* 3.23*  --  2.61* 3.46*  CALCIUM 7.7* 7.9* 8.2*  --  8.4* 8.4*  MG  --  1.7 2.0  --  1.9 1.9  PHOS  --   --   --   --  2.9 4.1     CBC:  Recent Labs Lab 05/24/15 0659 05/27/15 0440 05/29/15 0451 05/30/15 0448  WBC 6.2 6.6 6.1 9.6  HGB 7.4* 6.7* 6.7* 8.3*  HCT 21.8* 19.9* 19.5* 23.8*  MCV 98.0 95.6 97.5 91.5  PLT 204 238 221 238      Microbiology:  Recent Results (from the past 720 hour(s))  Wound culture      Status: None   Collection Time: 05/20/15 12:25 PM  Result Value Ref Range Status   Specimen Description GROIN RIGHT  Final   Special Requests NONE  Final   Gram Stain   Final    FEW WBC SEEN FEW GRAM NEGATIVE RODS RARE GRAM POSITIVE COCCI    Culture   Final    RARE PSEUDOMONAS AERUGINOSA LIGHT GROWTH VANCOMYCIN RESISTANT ENTEROCOCCUS    Report Status 05/25/2015 FINAL  Final   Organism ID, Bacteria PSEUDOMONAS AERUGINOSA  Final   Organism ID, Bacteria VANCOMYCIN RESISTANT ENTEROCOCCUS  Final      Susceptibility   Pseudomonas aeruginosa - MIC*    CEFTAZIDIME 4 SENSITIVE Sensitive     CIPROFLOXACIN 1 SENSITIVE Sensitive     GENTAMICIN <=1 SENSITIVE Sensitive     IMIPENEM 1 SENSITIVE Sensitive     CEFEPIME 4 SENSITIVE Sensitive     * RARE PSEUDOMONAS AERUGINOSA   Vancomycin resistant enterococcus - MIC*    AMPICILLIN  Value in next row Resistant      RESISTANT>=32    VANCOMYCIN Value in next row Resistant      RESISTANT>=32    GENTAMICIN SYNERGY Value in next row Sensitive      RESISTANT>=32    LINEZOLID Value in next row Sensitive      SENSITIVE2    * LIGHT GROWTH VANCOMYCIN RESISTANT ENTEROCOCCUS  MRSA PCR Screening     Status: None   Collection Time: 05/20/15 12:30 PM  Result Value Ref Range Status   MRSA by PCR NEGATIVE NEGATIVE Final    Comment:        The GeneXpert MRSA Assay (FDA approved for NASAL specimens only), is one component of a comprehensive MRSA colonization surveillance program. It is not intended to diagnose MRSA infection nor to guide or monitor treatment for MRSA infections.   Anaerobic culture     Status: None   Collection Time: 05/24/15  8:31 PM  Result Value Ref Range Status   Specimen Description GROIN RIGHT  Final   Special Requests NONE  Final   Culture NO ANAEROBES ISOLATED  Final   Report Status 05/28/2015 FINAL  Final  Wound culture     Status: None   Collection Time: 05/24/15  9:13 PM  Result Value Ref Range Status   Specimen  Description GROIN RIGHT  Final   Special Requests NONE  Final   Gram Stain FEW WBC SEEN MANY GRAM POSITIVE COCCI IN PAIRS   Final   Culture HEAVY GROWTH VANCOMYCIN RESISTANT ENTEROCOCCUS  Final   Report Status 05/27/2015 FINAL  Final   Organism ID, Bacteria VANCOMYCIN RESISTANT ENTEROCOCCUS  Final      Susceptibility   Vancomycin resistant enterococcus - MIC*    AMPICILLIN >=32 RESISTANT Resistant     VANCOMYCIN >=32 RESISTANT Resistant     GENTAMICIN SYNERGY SENSITIVE Sensitive     LINEZOLID 2 SENSITIVE Sensitive     * HEAVY GROWTH VANCOMYCIN RESISTANT ENTEROCOCCUS    Coagulation Studies: No results for input(s): LABPROT, INR in the last 72 hours.  Urinalysis: No results for input(s): COLORURINE, LABSPEC, PHURINE, GLUCOSEU, HGBUR, BILIRUBINUR, KETONESUR, PROTEINUR, UROBILINOGEN, NITRITE, LEUKOCYTESUR in the last 72 hours.  Invalid input(s): APPERANCEUR    Imaging: Dg Chest 2 View  05/28/2015  CLINICAL DATA:  65 year old female with history of shortness breath this morning. EXAM: CHEST  2 VIEW COMPARISON:  Chest x-ray 05/20/2015. FINDINGS: Right internal jugular PermCath with tips terminating in the mid and distal superior vena cava. Moderate bilateral pleural effusions layering dependently. Associated with this there are bibasilar opacities which are presumably areas of passive atelectasis. Remaining portions of the lungs are otherwise well aerated. Mild diffuse peribronchial cuffing. No evidence of pulmonary edema. Heart size is normal. Upper mediastinal contours are within normal limits. Atherosclerosis in the thoracic aorta. Vascular stent in the mid abdomen incompletely visualize (likely within the abdominal aorta). IMPRESSION: 1. Moderate bilateral pleural effusions likely with bibasilar passive subsegmental atelectasis. 2. Diffuse peribronchial cuffing, which could be seen in the setting of acute bronchitis. 3. Atherosclerosis. Electronically Signed   By: Vinnie Langton M.D.    On: 05/28/2015 15:50   Dg Chest Port 1 View  05/29/2015  CLINICAL DATA:  65 year old female with shortness of breath EXAM: PORTABLE CHEST 1 VIEW COMPARISON:  Radiograph dated 05/28/2015 FINDINGS: Right IJ dialysis catheter in stable positioning. There are small bilateral pleural effusions, right greater than left, with no significant interval change compared to prior study. Bilateral lower lung base airspace densities  appear also similar to prior study and may represent atelectatic changes or pneumonia. There is no pneumothorax. The cardiac silhouette is within normal limits. No acute osseous pathology. IMPRESSION: No significant interval change in the bilateral pleural effusions and bibasilar airspace densities. Clinical correlation and follow-up recommended. Electronically Signed   By: Anner Crete M.D.   On: 05/29/2015 21:19     Medications:   . nitroGLYCERIN 5 mcg/min (05/29/15 2238)   . acetaminophen  1,000 mg Oral 3 times per day  . albumin human  25 g Intravenous Once  . antiseptic oral rinse  7 mL Mouth Rinse BID  . aspirin EC  81 mg Oral Daily  . cefTAZidime (FORTAZ)  IV  1 g Intravenous Q24H  . DAPTOmycin (CUBICIN)  IV  300 mg Intravenous Q48H  . docusate sodium  100 mg Oral BID  . famotidine  20 mg Oral Q48H  . fluticasone  2 spray Each Nare Daily  . ipratropium-albuterol  3 mL Nebulization Q6H  . levothyroxine  88 mcg Oral Daily  . metoprolol  25 mg Oral BID  . mometasone-formoterol  2 puff Inhalation BID  . multivitamin  1 tablet Oral Daily  . potassium chloride  40 mEq Oral Once  . sodium chloride  1 spray Each Nare 5 X Daily   acetaminophen **OR** acetaminophen, albuterol, anticoagulant sodium citrate, bisacodyl, chlorpheniramine-HYDROcodone, guaiFENesin-dextromethorphan, HYDROcodone-acetaminophen, loperamide, magnesium hydroxide, morphine injection, ondansetron **OR** ondansetron (ZOFRAN) IV, ondansetron, promethazine, senna-docusate  Assessment/ Plan:  65 y.o.  female with COPD, hypertension, hyperlipidemia, vaginal intraepithelial neoplasia, emergent AAA repair status post rupture on November 18, left to right femoral to femoral bypass , acute renal failure leading to permanent dialysis, significant 3 vessel disease,   1. End-stage renal disease. [place]. TTS. [facility] nephrology - Dialysis today - Goal 2-3 L removed with iv albumin  2. SOB - Pleural effusions contributing  - see above  3. Peripheral edema:  - from 3rd spacing   4. Anemia of chronic kidney disease:   - Most recent hemoglobin at 8.3.   .  5. Secondary Hyperparathyroidism:   - monitor phos during admission.  6. 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   Pack cathter with citrate  7. s/p Incision and drainage of recurrent right groin seroma 05/24/15   Discussed with patient and her husband     LOS: [place] 3/2/20179:20 AM

## 2015-05-30 NOTE — Care Management (Signed)
Patient transferred to icu due to need for continuous bipap.  She has presented from [facility].  Vascular says that patient will require the VAC until the wound is healed

## 2015-05-30 NOTE — Progress Notes (Signed)
Post hd tx 

## 2015-05-31 ENCOUNTER — Inpatient Hospital Stay: Payer: Medicare Other

## 2015-05-31 LAB — PHOSPHORUS: PHOSPHORUS: 2.5 mg/dL (ref 2.5–4.6)

## 2015-05-31 LAB — BASIC METABOLIC PANEL
ANION GAP: 11 (ref 5–15)
BUN: 15 mg/dL (ref 6–20)
CHLORIDE: 101 mmol/L (ref 101–111)
CO2: 29 mmol/L (ref 22–32)
Calcium: 8.9 mg/dL (ref 8.9–10.3)
Creatinine, Ser: 2.6 mg/dL — ABNORMAL HIGH (ref 0.44–1.00)
GFR calc non Af Amer: 18 mL/min — ABNORMAL LOW (ref >60)
GFR, EST AFRICAN AMERICAN: 21 mL/min — AB (ref >60)
GLUCOSE: 87 mg/dL (ref 65–99)
Potassium: 3.9 mmol/L (ref 3.5–5.1)
Sodium: 141 mmol/L (ref 135–145)

## 2015-05-31 LAB — MAGNESIUM: Magnesium: 1.9 mg/dL (ref 1.7–2.4)

## 2015-05-31 LAB — BODY FLUID CELL COUNT WITH DIFFERENTIAL
EOS FL: 0 %
Lymphs, Fluid: 40 %
MONOCYTE-MACROPHAGE-SEROUS FLUID: 19 %
Neutrophil Count, Fluid: 41 %
OTHER CELLS FL: 0 %
WBC FLUID: 64 uL

## 2015-05-31 LAB — GLUCOSE, SEROUS FLUID: GLUCOSE FL: 87 mg/dL

## 2015-05-31 LAB — LACTATE DEHYDROGENASE, PLEURAL OR PERITONEAL FLUID: LD FL: 49 U/L — AB (ref 3–23)

## 2015-05-31 LAB — PROTEIN, BODY FLUID

## 2015-05-31 LAB — AMYLASE: Amylase: 86 U/L (ref 28–100)

## 2015-05-31 MED ORDER — ALPRAZOLAM 0.25 MG PO TABS
0.2500 mg | ORAL_TABLET | Freq: Two times a day (BID) | ORAL | Status: DC | PRN
  Administered 2015-05-31: 0.25 mg via ORAL
  Filled 2015-05-31: qty 1

## 2015-05-31 NOTE — Consult Note (Signed)
Pharmacy Antibiotic Note  [name] is a 65 y.o. female admitted on 05/20/2015 with Groin Wound infection: light growth + Vancomycin Resistant Enterococcus and rare + Pseudomonas aeurginosa in Hemodialysis Patient.  Pharmacy has been consulted for ceftazidime and daptomycin dosing.   Plan: Will continue daptomycin 300 mg IV q48h. Will need weekly CPK levels. (Dapto started 2/27). Dosing ~ 6 mg/kg for VRE. Will need to continue to follow HD schedule and ensure doses are given after HD.  2/27 CK 28  Continue ceftazidime 1gm q24hrs.   Height: 5\' 4"  (162.6 cm) Weight: 118 lb 6.4 oz (53.706 kg) IBW/kg (Calculated) : 54.7  Temp (24hrs), Avg:98 F (36.7 C), Min:97.7 F (36.5 C), Max:98.3 F (36.8 C)   Recent Labs Lab 05/25/15 1056 05/26/15 1100 05/27/15 0440 05/29/15 0451 05/30/15 0448 05/31/15 0527  WBC  --   --  6.6 6.1 9.6  --   CREATININE  --  2.54* 3.23* 2.61* 3.46* 2.60*  VANCOTROUGH 12  --   --   --   --   --     Estimated Creatinine Clearance: 18 mL/min (by C-G formula based on Cr of 2.6).    Allergies  Allergen Reactions  . Asa [Aspirin]   . 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 Plavix [Clopidogrel]      Antimicrobials this admission: Anti-infectives    Start     Dose/Rate Route Frequency Ordered Stop   05/30/15 1800  DAPTOmycin (CUBICIN) 300 mg in sodium chloride 0.9 % IVPB     300 mg 212 mL/hr over 30 Minutes Intravenous Every 48 hours 05/29/15 1118     05/28/15 1200  vancomycin (VANCOCIN) 750 mg in sodium chloride 0.9 % 100 mL IVPB  Status:  Discontinued     750 mg 100 mL/hr over 60 Minutes Intravenous Every T-Th-Sa (Hemodialysis) 05/25/15 1227 05/27/15 1110   05/28/15 1200  DAPTOmycin (CUBICIN) 250 mg in sodium chloride 0.9 % IVPB  Status:  Discontinued     250 mg 210 mL/hr over 30 Minutes Intravenous Every T-Th-Sa (Hemodialysis) 05/27/15 1114 05/29/15 1118   05/27/15 1200  DAPTOmycin (CUBICIN) 250 mg in sodium chloride 0.9 %  IVPB     250 mg 210 mL/hr over 30 Minutes Intravenous  Once 05/27/15 1114 05/27/15 1444   05/27/15 1115  DAPTOmycin (CUBICIN) 250 mg in sodium chloride 0.9 % IVPB  Status:  Discontinued     250 mg 210 mL/hr over 30 Minutes Intravenous Every 24 hours 05/27/15 1111 05/27/15 1114   05/26/15 1000  cefTAZidime (FORTAZ) 1 g in dextrose 5 % 50 mL IVPB     1 g 100 mL/hr over 30 Minutes Intravenous Every 24 hours 05/25/15 0311     05/25/15 1200  vancomycin (VANCOCIN) 500 mg in sodium chloride 0.9 % 100 mL IVPB  Status:  Discontinued     500 mg 100 mL/hr over 60 Minutes Intravenous Every T-Th-Sa (Hemodialysis) 05/24/15 1311 05/25/15 1227   05/25/15 0315  cefTAZidime (FORTAZ) 1 g in dextrose 5 % 50 mL IVPB     1 g 100 mL/hr over 30 Minutes Intravenous  Once 05/25/15 0311 05/25/15 0521   05/24/15 0000  ceFAZolin (ANCEF) IVPB 1 g/50 mL premix    Comments:  Send with pt to OR   1 g 100 mL/hr over 30 Minutes Intravenous On call 05/23/15 1752 05/25/15 0000   05/21/15 1200  vancomycin (VANCOCIN) 500 mg in sodium chloride 0.9 % 100 mL IVPB  Status:  Discontinued     500 mg 100 mL/hr over 60 Minutes Intravenous Every T-Th-Sa (Hemodialysis) 05/20/15 1355 05/23/15 1522   05/20/15 1830  piperacillin-tazobactam (ZOSYN) IVPB 3.375 g  Status:  Discontinued     3.375 g 12.5 mL/hr over 240 Minutes Intravenous Every 12 hours 05/20/15 1135 05/24/15 1855   05/20/15 1145  vancomycin (VANCOCIN) 1,250 mg in sodium chloride 0.9 % 250 mL IVPB  Status:  Discontinued     1,250 mg 166.7 mL/hr over 90 Minutes Intravenous  Once 05/20/15 1130 05/20/15 1130   05/20/15 1145  vancomycin (VANCOCIN) 500 mg in sodium chloride 0.9 % 100 mL IVPB     500 mg 100 mL/hr over 60 Minutes Intravenous  Once 05/20/15 1133 05/20/15 1320   05/20/15 0830  vancomycin (VANCOCIN) IVPB 1000 mg/200 mL premix     1,000 mg 200 mL/hr over 60 Minutes Intravenous  Once 05/20/15 0817 05/20/15 1000   05/20/15 0830  piperacillin-tazobactam (ZOSYN) IVPB  3.375 g     3.375 g 12.5 mL/hr over 240 Minutes Intravenous  Once 05/20/15 0817 05/20/15 0900      Microbiology results: Results for orders placed or performed during the hospital encounter of 05/20/15  Wound culture     Status: None   Collection Time: 05/20/15 12:25 PM  Result Value Ref Range Status   Specimen Description GROIN RIGHT  Final   Special Requests NONE  Final   Gram Stain   Final    FEW WBC SEEN FEW GRAM NEGATIVE RODS RARE GRAM POSITIVE COCCI    Culture   Final    RARE PSEUDOMONAS AERUGINOSA LIGHT GROWTH VANCOMYCIN RESISTANT ENTEROCOCCUS    Report Status 05/25/2015 FINAL  Final   Organism ID, Bacteria PSEUDOMONAS AERUGINOSA  Final   Organism ID, Bacteria VANCOMYCIN RESISTANT ENTEROCOCCUS  Final      Susceptibility   Pseudomonas aeruginosa - MIC*    CEFTAZIDIME 4 SENSITIVE Sensitive     CIPROFLOXACIN 1 SENSITIVE Sensitive     GENTAMICIN <=1 SENSITIVE Sensitive     IMIPENEM 1 SENSITIVE Sensitive     CEFEPIME 4 SENSITIVE Sensitive     * RARE PSEUDOMONAS AERUGINOSA   Vancomycin resistant enterococcus - MIC*    AMPICILLIN Value in next row Resistant      RESISTANT>=32    VANCOMYCIN Value in next row Resistant      RESISTANT>=32    GENTAMICIN SYNERGY Value in next row Sensitive      RESISTANT>=32    LINEZOLID Value in next row Sensitive      SENSITIVE2    * LIGHT GROWTH VANCOMYCIN RESISTANT ENTEROCOCCUS  MRSA PCR Screening     Status: None   Collection Time: 05/20/15 12:30 PM  Result Value Ref Range Status   MRSA by PCR NEGATIVE NEGATIVE Final    Comment:        The GeneXpert MRSA Assay (FDA approved for NASAL specimens only), is one component of a comprehensive MRSA colonization surveillance program. It is not intended to diagnose MRSA infection nor to guide or monitor treatment for MRSA infections.   Anaerobic culture     Status: None   Collection Time: 05/24/15  8:31 PM  Result Value Ref Range Status   Specimen Description GROIN RIGHT  Final    Special Requests NONE  Final   Culture NO ANAEROBES ISOLATED  Final   Report Status 05/28/2015 FINAL  Final  Wound culture     Status: None   Collection Time: 05/24/15  9:13 PM  Result Value  Ref Range Status   Specimen Description GROIN RIGHT  Final   Special Requests NONE  Final   Gram Stain FEW WBC SEEN MANY GRAM POSITIVE COCCI IN PAIRS   Final   Culture HEAVY GROWTH VANCOMYCIN RESISTANT ENTEROCOCCUS  Final   Report Status 05/27/2015 FINAL  Final   Organism ID, Bacteria VANCOMYCIN RESISTANT ENTEROCOCCUS  Final      Susceptibility   Vancomycin resistant enterococcus - MIC*    AMPICILLIN >=32 RESISTANT Resistant     VANCOMYCIN >=32 RESISTANT Resistant     GENTAMICIN SYNERGY SENSITIVE Sensitive     LINEZOLID 2 SENSITIVE Sensitive     * HEAVY GROWTH VANCOMYCIN RESISTANT ENTEROCOCCUS    Thank you for allowing pharmacy to be a part of this patient's care.  Kaliegh Willadsen A 05/31/2015 11:20 AM

## 2015-05-31 NOTE — Progress Notes (Signed)
[facility]  Daily Progress Note   Subjective  - 7 Days Post-Op  VAC has been functioning properly all day. Patient denies pain  Objective Filed Vitals:   05/31/15 1034 05/31/15 1100 05/31/15 1132 05/31/15 1326  BP: 142/72 122/59 141/69 127/60  Pulse: 96 92 90 99  Temp:   98.2 F (36.8 C) 97.9 F (36.6 C)  TempSrc:   Oral Oral  Resp: 16 18 18 24   Height:      Weight:      SpO2: 97% 98% 100% 99%    Intake/Output Summary (Last 24 hours) at 05/31/15 1732 Last data filed at 05/31/15 1620  Gross per 24 hour  Intake      0 ml  Output    125 ml  Net   -125 ml    PULM  Normal effort , no use of accessory muscles CV  No JVD, RRR Abd      No distended, nontender VASC  right groin VAC removed wound is clean with excellent granulation tissue both feet pink warm and well perfused  Laboratory CBC    Component Value Date/Time   WBC 9.6 05/30/2015 0448   WBC 9.3 07/19/2014 0841   HGB 8.3* 05/30/2015 0448   HGB 10.8* 07/19/2014 0841   HCT 23.8* 05/30/2015 0448   HCT 33.7* 07/19/2014 0841   PLT 238 05/30/2015 0448   PLT 237 07/19/2014 0841    BMET    Component Value Date/Time   NA 141 05/31/2015 0527   NA 135 07/22/2014 0645   K 3.9 05/31/2015 0527   K 3.7 07/22/2014 0645   CL 101 05/31/2015 0527   CL 112* 07/22/2014 0645   CO2 29 05/31/2015 0527   CO2 18* 07/22/2014 0645   GLUCOSE 87 05/31/2015 0527   GLUCOSE 77 07/22/2014 0645   BUN 15 05/31/2015 0527   BUN 24* 07/22/2014 0645   CREATININE 2.60* 05/31/2015 0527   CREATININE 1.00 07/22/2014 0645   CALCIUM 8.9 05/31/2015 0527   CALCIUM 8.3* 07/22/2014 0645   GFRNONAA 18* 05/31/2015 0527   GFRNONAA 59* 07/22/2014 0645   GFRNONAA 58* 05/07/2014 1302   GFRAA 21* 05/31/2015 0527   GFRAA >60 07/22/2014 0645   GFRAA >60 05/07/2014 1302    Assessment/Planning: Change in patient's respiratory status yesterday evening however this appears to resolve currently she is being transferred back to  the floor.  I will continue VAC even after discharge my plan will be for utilization of the VAC until the wound is healed. I appreciate both medicine's help as well as infectious disease. I have discussed antibiotics with Dr. Ola Spurr today. I have also reviewed the timing of placing a brachial axillary dialysis graft in her left arm. Dr. Ola Spurr was in agreement with moving forward in approximately 2 weeks. Antibiotics have been adjusted and hopefully we can transition to a sniff this Friday   Katha Cabal  05/31/2015, 5:32 PM

## 2015-05-31 NOTE — Progress Notes (Signed)
MEDICATION RELATED CONSULT NOTE - Follow Up  Pharmacy Consult for Potassium  Indication: Potassium replacement.    Allergies  Allergen Reactions  . Asa [Aspirin]   . 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 Plavix [Clopidogrel]     Patient Measurements: Height: 5\' 4"  (162.6 cm) Weight: 118 lb 2.7 oz (53.6 kg) IBW/kg (Calculated) : 54.7  Vital Signs: Temp: 98.3 F (36.8 C) (03/03 0528) Temp Source: Oral (03/03 0528) BP: 167/78 mmHg (03/03 0528) Pulse Rate: 104 (03/03 0528) Intake/Output from previous day: 03/02 0701 - 03/03 0700 In: -  Out: 3125 [Drains:125] Intake/Output from this shift: Total I/O In: -  Out: 125 [Drains:125]  Labs:  Recent Labs  05/29/15 0451 05/30/15 0448 05/31/15 0527  WBC 6.1 9.6  --   HGB 6.7* 8.3*  --   HCT 19.5* 23.8*  --   PLT 221 238  --   CREATININE 2.61* 3.46* 2.60*  MG 1.9 1.9 1.9  PHOS 2.9 4.1 2.5   Estimated Creatinine Clearance: 18 mL/min (by C-G formula based on Cr of 2.6).   Microbiology: Recent Results (from the past 720 hour(s))  Wound culture     Status: None   Collection Time: 05/20/15 12:25 PM  Result Value Ref Range Status   Specimen Description GROIN RIGHT  Final   Special Requests NONE  Final   Gram Stain   Final    FEW WBC SEEN FEW GRAM NEGATIVE RODS RARE GRAM POSITIVE COCCI    Culture   Final    RARE PSEUDOMONAS AERUGINOSA LIGHT GROWTH VANCOMYCIN RESISTANT ENTEROCOCCUS    Report Status 05/25/2015 FINAL  Final   Organism ID, Bacteria PSEUDOMONAS AERUGINOSA  Final   Organism ID, Bacteria VANCOMYCIN RESISTANT ENTEROCOCCUS  Final      Susceptibility   Pseudomonas aeruginosa - MIC*    CEFTAZIDIME 4 SENSITIVE Sensitive     CIPROFLOXACIN 1 SENSITIVE Sensitive     GENTAMICIN <=1 SENSITIVE Sensitive     IMIPENEM 1 SENSITIVE Sensitive     CEFEPIME 4 SENSITIVE Sensitive     * RARE PSEUDOMONAS AERUGINOSA   Vancomycin resistant enterococcus - MIC*    AMPICILLIN Value in next row Resistant       RESISTANT>=32    VANCOMYCIN Value in next row Resistant      RESISTANT>=32    GENTAMICIN SYNERGY Value in next row Sensitive      RESISTANT>=32    LINEZOLID Value in next row Sensitive      SENSITIVE2    * LIGHT GROWTH VANCOMYCIN RESISTANT ENTEROCOCCUS  MRSA PCR Screening     Status: None   Collection Time: 05/20/15 12:30 PM  Result Value Ref Range Status   MRSA by PCR NEGATIVE NEGATIVE Final    Comment:        The GeneXpert MRSA Assay (FDA approved for NASAL specimens only), is one component of a comprehensive MRSA colonization surveillance program. It is not intended to diagnose MRSA infection nor to guide or monitor treatment for MRSA infections.   Anaerobic culture     Status: None   Collection Time: 05/24/15  8:31 PM  Result Value Ref Range Status   Specimen Description GROIN RIGHT  Final   Special Requests NONE  Final   Culture NO ANAEROBES ISOLATED  Final   Report Status 05/28/2015 FINAL  Final  Wound culture     Status: None   Collection Time: 05/24/15  9:13 PM  Result Value Ref Range Status   Specimen  Description GROIN RIGHT  Final   Special Requests NONE  Final   Gram Stain FEW WBC SEEN MANY GRAM POSITIVE COCCI IN PAIRS   Final   Culture HEAVY GROWTH VANCOMYCIN RESISTANT ENTEROCOCCUS  Final   Report Status 05/27/2015 FINAL  Final   Organism ID, Bacteria VANCOMYCIN RESISTANT ENTEROCOCCUS  Final      Susceptibility   Vancomycin resistant enterococcus - MIC*    AMPICILLIN >=32 RESISTANT Resistant     VANCOMYCIN >=32 RESISTANT Resistant     GENTAMICIN SYNERGY SENSITIVE Sensitive     LINEZOLID 2 SENSITIVE Sensitive     * HEAVY GROWTH VANCOMYCIN RESISTANT ENTEROCOCCUS    Medical History: Past Medical History  Diagnosis Date  . Hyperlipidemia   . Hypertension   . Thyroid disease   . GERD (gastroesophageal reflux disease)   . Aneurysm ([place])     abdominal aortic  . STD (sexually transmitted disease)     chlamydia  . VAIN II (vaginal intraepithelial  neoplasia grade II) 7/09  . 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 B12 deficiency   . COPD (chronic obstructive pulmonary disease) ([place])   . Renal insufficiency   . Cancer ([place])     skin on left ankle 04/10/15 pt states she has never had cancer    Medications:  Prescriptions prior to admission  Medication Sig Dispense Refill Last Dose  . acetaminophen (TYLENOL) 500 MG tablet Take 1,000 mg by mouth every 8 (eight) hours.   unknown at unknown  . albuterol (PROVENTIL) (2.5 MG/3ML) 0.083% nebulizer solution Take 2.5 mg by nebulization every 6 (six) hours as needed for wheezing.   prn at prn  . amLODipine (NORVASC) 5 MG tablet Take 1 tablet (5 mg total) by mouth daily. 30 tablet 6 unknown at unknown  . aspirin EC 81 MG tablet Take 81 mg by mouth daily.   unknown at unknown  . atorvastatin (LIPITOR) 40 MG tablet Take 40 mg by mouth daily.   unknown at unknown  . cefTRIAXone (ROCEPHIN) 1-3.74 GM-% IVPB Inject 1 g into the vein daily.   unknown at unknown  . chlorpheniramine-HYDROcodone (TUSSIONEX) 10-8 MG/5ML SUER Take 5 mLs by mouth every 12 (twelve) hours as needed for cough. 140 mL 0 prn at prn  . docusate sodium (COLACE) 100 MG capsule Take 100 mg by mouth 2 (two) times daily.    unknown at unknown  . doxycycline (VIBRA-TABS) 100 MG tablet Take 100 mg by mouth 2 (two) times daily. For 14 days   unknown at unknown  . famotidine (PEPCID) 20 MG tablet Take 20 mg by mouth daily.   unknown at unknown  . fluticasone (FLONASE) 50 MCG/ACT nasal spray Place 2 sprays into both nostrils daily.   unknown at unknown  . fluticasone furoate-vilanterol (BREO ELLIPTA) 100-25 MCG/INH AEPB Inhale 1 puff into the lungs daily.   unknown at unknown  . guaiFENesin-dextromethorphan (ROBITUSSIN DM) 100-10 MG/5ML syrup Take 10 mLs by mouth every 4 (four) hours as needed for cough. 118 mL 0 prn at prn  . hydrALAZINE (APRESOLINE) 10 MG tablet Take 10 mg by mouth every 6 (six) hours.   unknown at unknown   . irbesartan (AVAPRO) 75 MG tablet Take 1 tablet (75 mg total) by mouth 2 (two) times daily. 30 tablet 5 unknown at unknown  . levothyroxine (SYNTHROID, LEVOTHROID) 88 MCG tablet Take 88 mcg by mouth daily.   unknown at unknown  . loperamide (IMODIUM) 2 MG capsule Take 2 mg by  mouth every 4 (four) hours as needed for diarrhea or loose stools.   prn at prn  . magnesium hydroxide (MILK OF MAGNESIA) 400 MG/5ML suspension Take 30 mLs by mouth daily as needed for mild constipation.   prn at prn  . metoprolol (LOPRESSOR) 50 MG tablet Take 1 tablet (50 mg total) by mouth every 8 (eight) hours. 90 tablet 5 unknown at unknown  . multivitamin (RENA-VIT) TABS tablet Take 1 tablet by mouth daily.   unknown at unknown  . ondansetron (ZOFRAN) 4 MG tablet Take 4 mg by mouth every 6 (six) hours as needed for nausea or vomiting.   prn at prn  . promethazine (PHENERGAN) 25 MG/ML injection Inject 25 mg into the vein every 6 (six) hours as needed for nausea or vomiting.   prn at prn  . sodium chloride (OCEAN) 0.65 % SOLN nasal spray Place 1 spray into both nostrils 5 (five) times daily.   unknown at unknown    Assessment: Pharmacy consulted to assist in managing electrolytes in this 65 y/o F admitted with sepsis due to B/L PNA.   K= 3.4, Mag= 2.0  Goal of Therapy:  K :  3.5 - 5.1   Plan:  Electrolytes WNL at this time Will recheck electrolytes tomorrow with AM labs.  Laural Benes, Pharm.D., BCPS Clinical Pharmacist 05/31/2015,6:41 AM

## 2015-05-31 NOTE — Progress Notes (Signed)
[facility] [place] NAME: Ashleyn Agne    MR#:  [id number]  DATE OF BIRTH:  [birth year]  SUBJECTIVE:  Patient was seen and examined prior to thoracocentesis today. Having intermittent episodes of shortness of breath    REVIEW OF SYSTEMS:   Review of Systems  Constitutional: Negative for fever, chills and weight loss.  HENT: Negative for ear discharge, ear pain and nosebleeds.   Eyes: Negative for blurred vision, pain, discharge and redness.  Respiratory: Negative for sputum production, shortness of breath, wheezing and stridor.   Cardiovascular: Negative for chest pain, palpitations, orthopnea and PND.  Gastrointestinal: Negative for nausea, vomiting, abdominal pain and diarrhea.  Genitourinary: Negative for urgency and frequency.  Musculoskeletal: Negative for back pain and joint pain.  Skin:       Right groin with wound VAC  Neurological: Negative for sensory change, speech change, focal weakness and weakness.  Psychiatric/Behavioral: Negative for depression and hallucinations. The patient is not nervous/anxious.    Tolerating Diet: Yes Tolerating PT: Pending  DRUG ALLERGIES:   Allergies  Allergen Reactions  . Asa [Aspirin]   . 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 Plavix [Clopidogrel]     VITALS:  Blood pressure 127/60, pulse 99, temperature 97.9 F (36.6 C), temperature source Oral, resp. rate 24, height 5\' 4"  (1.626 m), weight 53.706 kg (118 lb 6.4 oz), last menstrual period 03/30/1988, SpO2 99 %.  PHYSICAL EXAMINATION:   Physical Exam  GENERAL:  65 y.o.-year-old patient lying in the bed with no acute distress.  EYES: Pupils equal, round, reactive to light and accommodation. No scleral icterus. Extraocular muscles intact.  HEENT: Head atraumatic, normocephalic. Oropharynx and nasopharynx clear.  NECK:  Supple, no jugular venous distention. No thyroid enlargement, no tenderness.  LUNGS:  Distant breath sounds bilaterally, no wheezing, rales, rhonchi. No use of accessory muscles of respiration.  CARDIOVASCULAR: S1, S2 normal. No murmurs, rubs, or gallops.  ABDOMEN: Soft, nontender, nondistended. Bowel sounds present. No organomegaly or mass.  EXTREMITIES: No cyanosis, clubbing or edema b/l.   Right groin with wound vac NEUROLOGIC: Cranial nerves II through XII are intact. No focal Motor or sensory deficits b/l.   PSYCHIATRIC:  patient is alert and oriented x 3.  SKIN: No obvious rash, lesion, or ulcer.   LABORATORY PANEL:  CBC  Recent Labs Lab 05/30/15 0448  WBC 9.6  HGB 8.3*  HCT 23.8*  PLT 238    Chemistries   Recent Labs Lab 05/31/15 0527  NA 141  K 3.9  CL 101  CO2 29  GLUCOSE 87  BUN 15  CREATININE 2.60*  CALCIUM 8.9  MG 1.9   Cardiac Enzymes No results for input(s): TROPONINI in the last 168 hours. RADIOLOGY:  X-ray Chest Pa Or Ap  05/31/2015  CLINICAL DATA:  65 year old female status post right-sided thoracentesis EXAM: CHEST 1 VIEW COMPARISON:  Prior chest x-ray 05/29/2015 FINDINGS: No evidence of right-sided pneumothorax. Significant interval decrease in right pleural effusion. There may be a trace right pleural effusion left. Right IJ approach tunneled hemodialysis catheter with the tip overlying the distal SVC. Persistent moderate to large layering left pleural effusion and associated left basilar atelectasis. Reduced right lung atelectasis. Stable cardiac and mediastinal contours with aortic atherosclerosis. Incompletely imaged aortic stent graft. IMPRESSION: No evidence of pneumothorax or complication post right-sided thoracentesis. Significant interval reduction in the right-sided pleural effusion. Persistent moderate layering left pleural effusion. Electronically Signed  By: Jacqulynn Cadet M.D.   On: 05/31/2015 12:17   Ct Chest Wo Contrast  05/30/2015  CLINICAL DATA:  Shortness of Breath EXAM: CT CHEST WITHOUT CONTRAST TECHNIQUE:  Multidetector CT imaging of the chest was performed following the standard protocol without IV contrast. COMPARISON:  05/29/2015, 07/19/2014 FINDINGS: Bilateral large pleural effusions are identified. Mild interstitial thickening is noted likely related to a degree of edema. Some lingular infiltrate as well as bilateral lower lobe consolidation is noted. No focal parenchymal nodule is seen. The thoracic inlet shows evidence of a right jugular dialysis catheter. Aortic calcification is noted as well as of its branches. No aneurysmal dilatation is seen. Coronary calcifications are noted. No hilar or mediastinal adenopathy is noted at this time although the lack of IV contrast somewhat limits the exam. The visualized upper abdomen is within normal limits. There are changes consistent with an aortic stent graft also incompletely evaluated with the lack of IV contrast. The bony structures show no acute abnormality. IMPRESSION: Bilateral pleural effusions with associated lower lobe consolidation and multiple lingular infiltrate. Electronically Signed   By: Inez Catalina M.D.   On: 05/30/2015 14:22   Dg Chest Port 1 View  05/29/2015  CLINICAL DATA:  65 year old female with shortness of breath EXAM: PORTABLE CHEST 1 VIEW COMPARISON:  Radiograph dated 05/28/2015 FINDINGS: Right IJ dialysis catheter in stable positioning. There are small bilateral pleural effusions, right greater than left, with no significant interval change compared to prior study. Bilateral lower lung base airspace densities appear also similar to prior study and may represent atelectatic changes or pneumonia. There is no pneumothorax. The cardiac silhouette is within normal limits. No acute osseous pathology. IMPRESSION: No significant interval change in the bilateral pleural effusions and bibasilar airspace densities. Clinical correlation and follow-up recommended. Electronically Signed   By: Anner Crete M.D.   On: 05/29/2015 21:19   US  Thoracentesis Asp Pleural Space W/img Guide  05/31/2015  INDICATION: 65 year old female with shortness of breath and bilateral moderate to large layering pleural effusions. EXAM: ULTRASOUND GUIDED RIGHT THORACENTESIS MEDICATIONS: None. COMPLICATIONS: None immediate. PROCEDURE: An ultrasound guided thoracentesis was thoroughly discussed with the patient and questions answered. The benefits, risks, alternatives and complications were also discussed. The patient understands and wishes to proceed with the procedure. Written consent was obtained. Ultrasound was performed to localize and mark an adequate pocket of fluid in the right chest. The area was then prepped and draped in the normal sterile fashion. 1% Lidocaine was used for local anesthesia. Under ultrasound guidance a 6 Fr Safe-T-Centesis catheter was introduced. Thoracentesis was performed. The catheter was removed and a dressing applied. FINDINGS: A total of approximately 1700 mL of amber colored pleural fluid was removed. IMPRESSION: Successful ultrasound guided right thoracentesis yielding 1.7 L of pleural fluid. Electronically Signed   By: Jacqulynn Cadet M.D.   On: 05/31/2015 12:22   ASSESSMENT AND PLAN:  Hawley Frymyer is a 65 y.o. female with a known history of chronic diastolic congestive heart failure, incisional disease on hemodialysis, history of HIT,, anemia of chronic disease, history of right groin seroma after vascular procedures status post wound VAC placement in January 2017 comes to the emergency room from [facility] with fever of 102.3 shortness of breath. Patient was found to have sepsis with right lower lobe pneumonia. She was found to be hypoxic with sats 72% on ABG.  #Acute respiratory distress probably from pleural effusions Resolved  #. Sepsis secondary to bilateral  lobe pneumonia hospital-acquired (patient was  admitted and discharge in January 2017) and severe right groin infection of seroma recurrent -CT chest  with large bilateral pleural effusions, ordered thoracocentesis ultrasound-guided for t today -wound cx 2/24 with pseudomonas and VRE, ID recommending Daptomycin and Ceftazidime q 48 hrs durin HD - WOUND VAC -Uncertain if the grafts are infected but if so would need long term suppressive abx, ID will d/w dr.Schnier.  -Pulmonary consultation appreciated. -f/u with Dr. Murlean Iba   from vascular surgery , s/p I and D and wound VAC 2/24, vascular  is okay to discharge the patient from the standpoint -Appreciated ID consult regarding right groin infection  2. Acute on chronic diastolic congestive heart failure with large bilateral pleural effusions - on hemodialysis with ultrafiltration -Continue cardiac meds  3. Hypertension Continue amlodipine, metoprolol, Avapro, hydralazine  4. History of HIT -Avoid antiplatelet agents -SCD and teds for DVT prophylaxis  5. End-stage renal disease on hemodialysis -Nephrology consultation for in-house HD  6. Hyperlipidemia continue statins  7. Anemia of chronic disease  Hemoglobin is 6.7 -8.3, s/p 1 unit of blood   8. Bilateral Pleural effusions on CT chest Thoracocentesis today  PT consult for deconditioning Case discussed with Care Management/Social Worker. Management plans discussed with the patient, family and they are in agreement. Anticipated discharge in 1-2 days  CODE STATUS: Full  DVT Prophylaxis: SCD and teds  TOTAL TIME TAKING CARE OF THIS PATIENT: 35 minutes.  >50% time spent on counselling and coordination of care  D/C  DEPENDING ON CLINICAL CONDITION TO [facility]  Note: This dictation was prepared with Dragon dictation along with smaller phrase technology. Any transcriptional errors that result from this process are unintentional.  Nicholes Mango M.D on 05/31/2015 at 2:54 PM  Between 7am to 6pm - Pager - [phone]  After 6pm go to [email or web address] - password [email or web address] [facility]  Office   [phone]  CC: Primary care physician; Arnette Norris, MD

## 2015-05-31 NOTE — Clinical Social Work Note (Signed)
Patient has transferred back out to medical unit. CSW to facilitate discharge back to [facility] when time. Shela Leff MSW,LCSW [phone]

## 2015-05-31 NOTE — Progress Notes (Signed)
[place] INFECTIOUS DISEASE PROGRESS NOTE Date of Admission:  05/20/2015     ID: [name] is a 65 y.o. female with pseudomonal R groin seroma infection Active Problems:   Sepsis ([place])   Pressure ulcer   Subjective: Had worsening resp status so had R thora 1700 cc removed No fevers  ROS  Eleven systems are reviewed and negative except per hpi  Medications:  Antibiotics Given (last 72 hours)    Date/Time Action Medication Dose Rate   05/28/15 1503 Given   cefTAZidime (FORTAZ) 1 g in dextrose 5 % 50 mL IVPB 1 g 100 mL/hr   05/28/15 1509 Given   DAPTOmycin (CUBICIN) 250 mg in sodium chloride 0.9 % IVPB 250 mg 210 mL/hr   05/29/15 2209 Given   cefTAZidime (FORTAZ) 1 g in dextrose 5 % 50 mL IVPB 1 g 100 mL/hr     . acetaminophen  1,000 mg Oral 3 times per day  . antiseptic oral rinse  7 mL Mouth Rinse BID  . aspirin EC  81 mg Oral Daily  . cefTAZidime (FORTAZ)  IV  1 g Intravenous Q24H  . DAPTOmycin (CUBICIN)  IV  300 mg Intravenous Q48H  . docusate sodium  100 mg Oral BID  . famotidine  20 mg Oral Q48H  . fluticasone  2 spray Each Nare Daily  . ipratropium-albuterol  3 mL Nebulization TID  . levothyroxine  88 mcg Oral Daily  . metoprolol  25 mg Oral BID  . mometasone-formoterol  2 puff Inhalation BID  . multivitamin  1 tablet Oral Daily  . potassium chloride  40 mEq Oral Once  . sodium chloride  1 spray Each Nare 5 X Daily    Objective: Vital signs in last 24 hours: Temp:  [97.7 F (36.5 C)-98.3 F (36.8 C)] 97.9 F (36.6 C) (03/03 1326) Pulse Rate:  [86-105] 99 (03/03 1326) Resp:  [14-24] 24 (03/03 1326) BP: (122-167)/(54-78) 127/60 mmHg (03/03 1326) SpO2:  [97 %-100 %] 99 % (03/03 1326) FiO2 (%):  [40 %] 40 % (03/02 1438) Weight:  [53.706 kg (118 lb 6.4 oz)] 53.706 kg (118 lb 6.4 oz) (03/03 0528) Constitutional: Thin, chronically ill appearing HENT: [place]/AT, PERRLA, no scleral icterus Mouth/Throat: Oropharynx is clear and moist. No oropharyngeal  exudate.  Cardiovascular: Normal rate,  R chest wall HD cath wnl Pulmonary/Chest: bil rhonchi Neck = supple, no nuchal rigidity Abdominal: Soft. Bowel sounds are normal. exhibits no distension. There is no tenderness.  R groin site covered with wound vac Lymphadenopathy: no cervical adenopathy. No axillary adenopathy Neurological: alert and oriented to person, place, and time.  Skin: Skin is warm and dry.  Psychiatric: a normal mood and affect. behavior is normal.  Lab Results  Recent Labs  05/29/15 0451 05/30/15 0448 05/31/15 0527  WBC 6.1 9.6  --   HGB 6.7* 8.3*  --   HCT 19.5* 23.8*  --   NA 138 138 141  K 4.3 4.8 3.9  CL 102 102 101  CO2 31 29 29   BUN 12 21* 15  CREATININE 2.61* 3.46* 2.60*    Microbiology: Results for orders placed or performed during the hospital encounter of 05/20/15  Wound culture     Status: None   Collection Time: 05/20/15 12:25 PM  Result Value Ref Range Status   Specimen Description GROIN RIGHT  Final   Special Requests NONE  Final   Gram Stain   Final    FEW WBC SEEN FEW GRAM NEGATIVE RODS RARE  GRAM POSITIVE COCCI    Culture   Final    RARE PSEUDOMONAS AERUGINOSA LIGHT GROWTH VANCOMYCIN RESISTANT ENTEROCOCCUS    Report Status 05/25/2015 FINAL  Final   Organism ID, Bacteria PSEUDOMONAS AERUGINOSA  Final   Organism ID, Bacteria VANCOMYCIN RESISTANT ENTEROCOCCUS  Final      Susceptibility   Pseudomonas aeruginosa - MIC*    CEFTAZIDIME 4 SENSITIVE Sensitive     CIPROFLOXACIN 1 SENSITIVE Sensitive     GENTAMICIN <=1 SENSITIVE Sensitive     IMIPENEM 1 SENSITIVE Sensitive     CEFEPIME 4 SENSITIVE Sensitive     * RARE PSEUDOMONAS AERUGINOSA   Vancomycin resistant enterococcus - MIC*    AMPICILLIN Value in next row Resistant      RESISTANT>=32    VANCOMYCIN Value in next row Resistant      RESISTANT>=32    GENTAMICIN SYNERGY Value in next row Sensitive      RESISTANT>=32    LINEZOLID Value in next row Sensitive      SENSITIVE2     * LIGHT GROWTH VANCOMYCIN RESISTANT ENTEROCOCCUS  MRSA PCR Screening     Status: None   Collection Time: 05/20/15 12:30 PM  Result Value Ref Range Status   MRSA by PCR NEGATIVE NEGATIVE Final    Comment:        The GeneXpert MRSA Assay (FDA approved for NASAL specimens only), is one component of a comprehensive MRSA colonization surveillance program. It is not intended to diagnose MRSA infection nor to guide or monitor treatment for MRSA infections.   Anaerobic culture     Status: None   Collection Time: 05/24/15  8:31 PM  Result Value Ref Range Status   Specimen Description GROIN RIGHT  Final   Special Requests NONE  Final   Culture NO ANAEROBES ISOLATED  Final   Report Status 05/28/2015 FINAL  Final  Wound culture     Status: None   Collection Time: 05/24/15  9:13 PM  Result Value Ref Range Status   Specimen Description GROIN RIGHT  Final   Special Requests NONE  Final   Gram Stain FEW WBC SEEN MANY GRAM POSITIVE COCCI IN PAIRS   Final   Culture HEAVY GROWTH VANCOMYCIN RESISTANT ENTEROCOCCUS  Final   Report Status 05/27/2015 FINAL  Final   Organism ID, Bacteria VANCOMYCIN RESISTANT ENTEROCOCCUS  Final      Susceptibility   Vancomycin resistant enterococcus - MIC*    AMPICILLIN >=32 RESISTANT Resistant     VANCOMYCIN >=32 RESISTANT Resistant     GENTAMICIN SYNERGY SENSITIVE Sensitive     LINEZOLID 2 SENSITIVE Sensitive     * HEAVY GROWTH VANCOMYCIN RESISTANT ENTEROCOCCUS    Studies/Results: X-ray Chest Pa Or Ap  05/31/2015  CLINICAL DATA:  65 year old female status post right-sided thoracentesis EXAM: CHEST 1 VIEW COMPARISON:  Prior chest x-ray 05/29/2015 FINDINGS: No evidence of right-sided pneumothorax. Significant interval decrease in right pleural effusion. There may be a trace right pleural effusion left. Right IJ approach tunneled hemodialysis catheter with the tip overlying the distal SVC. Persistent moderate to large layering left pleural effusion and  associated left basilar atelectasis. Reduced right lung atelectasis. Stable cardiac and mediastinal contours with aortic atherosclerosis. Incompletely imaged aortic stent graft. IMPRESSION: No evidence of pneumothorax or complication post right-sided thoracentesis. Significant interval reduction in the right-sided pleural effusion. Persistent moderate layering left pleural effusion. Electronically Signed   By: Jacqulynn Cadet M.D.   On: 05/31/2015 12:17   Dg Chest 2 View  05/28/2015  CLINICAL DATA:  65 year old female with history of shortness breath this morning. EXAM: CHEST  2 VIEW COMPARISON:  Chest x-ray 05/20/2015. FINDINGS: Right internal jugular PermCath with tips terminating in the mid and distal superior vena cava. Moderate bilateral pleural effusions layering dependently. Associated with this there are bibasilar opacities which are presumably areas of passive atelectasis. Remaining portions of the lungs are otherwise well aerated. Mild diffuse peribronchial cuffing. No evidence of pulmonary edema. Heart size is normal. Upper mediastinal contours are within normal limits. Atherosclerosis in the thoracic aorta. Vascular stent in the mid abdomen incompletely visualize (likely within the abdominal aorta). IMPRESSION: 1. Moderate bilateral pleural effusions likely with bibasilar passive subsegmental atelectasis. 2. Diffuse peribronchial cuffing, which could be seen in the setting of acute bronchitis. 3. Atherosclerosis. Electronically Signed   By: Vinnie Langton M.D.   On: 05/28/2015 15:50   Ct Chest Wo Contrast  05/30/2015  CLINICAL DATA:  Shortness of Breath EXAM: CT CHEST WITHOUT CONTRAST TECHNIQUE: Multidetector CT imaging of the chest was performed following the standard protocol without IV contrast. COMPARISON:  05/29/2015, 07/19/2014 FINDINGS: Bilateral large pleural effusions are identified. Mild interstitial thickening is noted likely related to a degree of edema. Some lingular infiltrate as  well as bilateral lower lobe consolidation is noted. No focal parenchymal nodule is seen. The thoracic inlet shows evidence of a right jugular dialysis catheter. Aortic calcification is noted as well as of its branches. No aneurysmal dilatation is seen. Coronary calcifications are noted. No hilar or mediastinal adenopathy is noted at this time although the lack of IV contrast somewhat limits the exam. The visualized upper abdomen is within normal limits. There are changes consistent with an aortic stent graft also incompletely evaluated with the lack of IV contrast. The bony structures show no acute abnormality. IMPRESSION: Bilateral pleural effusions with associated lower lobe consolidation and multiple lingular infiltrate. Electronically Signed   By: Inez Catalina M.D.   On: 05/30/2015 14:22   Ct Abdomen Pelvis W Contrast  05/21/2015  CLINICAL DATA:  Fever of 102.3 degrees in shortness of breath, right lower lobe pneumonia and sepsis with hypoxia, right groin seroma status post incision and drainage with wound VAC with continued serosanguineous drainage, sepsis, diastolic congestive heart failure EXAM: CT ABDOMEN AND PELVIS WITH CONTRAST TECHNIQUE: Multidetector CT imaging of the abdomen and pelvis was performed using the standard protocol following bolus administration of intravenous contrast. CONTRAST:  20mL OMNIPAQUE IOHEXOL 300 MG/ML  SOLN COMPARISON:  04/10/2015 FINDINGS: Lower chest: Large bilateral pleural effusions with underlying atelectasis. Bilateral lower lobe consolidation right greater than left with air bronchograms. Patchy interstitial infiltrate right middle lobe. Hepatobiliary: Sub cm cyst right lobe stable. Sub cm calcified gallstone stable. Pancreas: Normal Spleen: No focal abnormalities Adrenals/Urinary Tract: Stable bilateral renal atrophy. Bladder is normal. Stomach/Bowel: Fecal retention throughout the ascending colon. Nonobstructive bowel gas pattern. Vascular/Lymphatic: Stable 4.5 cm  infrarenal abdominal aortic aneurysm with stent graft. Stable since then bypass. In the right inguinal region, there is a fluid-filled structure with thick enhancing wall. It measures 38 x 39 mm, not significantly different in size. There is discrete increase in the thickness and degree of enhancement involving the wall. In the left inguinal region there is a similar but smaller fluid collection. It measures about 39 x 29 mm, also slightly larger. It has also developed ran enhancement. Reproductive: Not visualized Other: Diffuse increased attenuation throughout the subcutaneous soft tissues worse when compared to the prior study consistent with anasarca. Musculoskeletal: No acute musculoskeletal findings.  IMPRESSION: 1. Large pleural effusions with anasarca. Anasarca is worse when compared to the prior study. 2. Bilateral lower lobe consolidation is likely largely due to atelectasis, right greater than left. Additionally there is patchy infiltrate throughout the right middle and lower lobe consistent with clinical history of pneumonia. 3. Bilateral inguinal fluid extends both appear mildly larger when compared to prior study and have developed thick enhancing rims. Sterility of the collections on certain. Electronically Signed   By: Skipper Cliche M.D.   On: 05/21/2015 20:15   Dg Chest Port 1 View  05/29/2015  CLINICAL DATA:  65 year old female with shortness of breath EXAM: PORTABLE CHEST 1 VIEW COMPARISON:  Radiograph dated 05/28/2015 FINDINGS: Right IJ dialysis catheter in stable positioning. There are small bilateral pleural effusions, right greater than left, with no significant interval change compared to prior study. Bilateral lower lung base airspace densities appear also similar to prior study and may represent atelectatic changes or pneumonia. There is no pneumothorax. The cardiac silhouette is within normal limits. No acute osseous pathology. IMPRESSION: No significant interval change in the bilateral  pleural effusions and bibasilar airspace densities. Clinical correlation and follow-up recommended. Electronically Signed   By: Anner Crete M.D.   On: 05/29/2015 21:19   Dg Chest Port 1 View  05/20/2015  CLINICAL DATA:  65 year old female with respiratory distress and lower extremity swelling. EXAM: PORTABLE CHEST 1 VIEW COMPARISON:  Radiograph dated 04/17/2015 FINDINGS: A dual lumen dialysis catheter is noted in stable positioning. There has been interval removal of the left IJ central line. Single portable view of the chest demonstrates emphysematous changes lungs. There are bilateral hazy densities, right greater left. There has been interval improvement of the interstitial prominence, likely interval improvement in pulmonary edema. Small left pleural effusion may be present. The cardiac silhouette is within normal limits. No acute osseous pathology identified. IMPRESSION: Interval removal of the left IJ central venous line. Interval improvement of the previously mild pulmonary edema. Bibasilar hazy airspace densities, right appearing left. Electronically Signed   By: Anner Crete M.D.   On: 05/20/2015 08:30   US Thoracentesis Asp Pleural Space W/img Guide  05/31/2015  INDICATION: 65 year old female with shortness of breath and bilateral moderate to large layering pleural effusions. EXAM: ULTRASOUND GUIDED RIGHT THORACENTESIS MEDICATIONS: None. COMPLICATIONS: None immediate. PROCEDURE: An ultrasound guided thoracentesis was thoroughly discussed with the patient and questions answered. The benefits, risks, alternatives and complications were also discussed. The patient understands and wishes to proceed with the procedure. Written consent was obtained. Ultrasound was performed to localize and mark an adequate pocket of fluid in the right chest. The area was then prepped and draped in the normal sterile fashion. 1% Lidocaine was used for local anesthesia. Under ultrasound guidance a 6 Fr  Safe-T-Centesis catheter was introduced. Thoracentesis was performed. The catheter was removed and a dressing applied. FINDINGS: A total of approximately 1700 mL of amber colored pleural fluid was removed. IMPRESSION: Successful ultrasound guided right thoracentesis yielding 1.7 L of pleural fluid. Electronically Signed   By: Jacqulynn Cadet M.D.   On: 05/31/2015 12:22    Assessment/Plan: [name] is a 65 y.o. female with a complicated vascular history as detailed by Dr Ronalee Belts and Lucky Cowboy. She is currently dealing with a seroma in her R groin, draining purulent material and with surrounding redness, pain and induration . On admission she had temp 102.3 and was hypoxic with CXR suggesting RLL pna.  Since admission she has been treated with vanco and zosyn,  and received HD. Clinically she is improving and culture from the R wound is now growing pseudomonas and VRE  CT does show bilateral rim enhancing fluid collections S/p I and D 2/24 by Dr Delana Meyer.   Recommendations Cont ceftazidime and daptomycin (Pseudomonas and VRE)  - Will need long term IV therapy to help this heal  - can use ceftazidime and dapto at HD  Uncertain if the grafts are infected but if so would need long term suppressive abx. Will discuss with Dr Lucky Cowboy and Delana Meyer.  I have discussed with micro and the VRE is resistant to all  oral agents except linezolid, sensitive to tigecycline, syncercid. Dapto sensitivities have been sent off Could use intermittent ceftazidime or aminogylcoside if suppression is needed and can give at HD  Thank you very much for the consult. Will follow with you.  [place], [place]   05/31/2015, 2:27 PM

## 2015-05-31 NOTE — Care Management Important Message (Signed)
Important Message  Patient Details  Name: [name] MRN: [id number] Date of Birth: June 17, 1950   Medicare Important Message Given:  Yes    Beverly Sessions, RN 05/31/2015, 10:19 AM

## 2015-05-31 NOTE — Progress Notes (Signed)
Okay per Dr. Margaretmary Eddy to clean out signed and held orders looks like several orders were duplicates.

## 2015-05-31 NOTE — Progress Notes (Signed)
Bipap is not in room. Pt is in no distress. Pt doesn't wish to use a Bipap

## 2015-05-31 NOTE — Progress Notes (Signed)
Per Dr. Margaretmary Eddy okay to place order for regular diet following procedure

## 2015-06-01 DIAGNOSIS — R7881 Bacteremia: Secondary | ICD-10-CM | POA: Diagnosis not present

## 2015-06-01 DIAGNOSIS — D7582 Heparin induced thrombocytopenia (HIT): Secondary | ICD-10-CM | POA: Diagnosis present

## 2015-06-01 DIAGNOSIS — K449 Diaphragmatic hernia without obstruction or gangrene: Secondary | ICD-10-CM | POA: Diagnosis present

## 2015-06-01 DIAGNOSIS — E7800 Pure hypercholesterolemia, unspecified: Secondary | ICD-10-CM | POA: Diagnosis not present

## 2015-06-01 DIAGNOSIS — E039 Hypothyroidism, unspecified: Secondary | ICD-10-CM | POA: Diagnosis not present

## 2015-06-01 DIAGNOSIS — I953 Hypotension of hemodialysis: Secondary | ICD-10-CM | POA: Diagnosis present

## 2015-06-01 DIAGNOSIS — I2510 Atherosclerotic heart disease of native coronary artery without angina pectoris: Secondary | ICD-10-CM | POA: Diagnosis present

## 2015-06-01 DIAGNOSIS — D649 Anemia, unspecified: Secondary | ICD-10-CM | POA: Diagnosis not present

## 2015-06-01 DIAGNOSIS — I959 Hypotension, unspecified: Secondary | ICD-10-CM | POA: Diagnosis not present

## 2015-06-01 DIAGNOSIS — I132 Hypertensive heart and chronic kidney disease with heart failure and with stage 5 chronic kidney disease, or end stage renal disease: Secondary | ICD-10-CM | POA: Diagnosis not present

## 2015-06-01 DIAGNOSIS — R262 Difficulty in walking, not elsewhere classified: Secondary | ICD-10-CM | POA: Diagnosis not present

## 2015-06-01 DIAGNOSIS — K219 Gastro-esophageal reflux disease without esophagitis: Secondary | ICD-10-CM | POA: Diagnosis present

## 2015-06-01 DIAGNOSIS — E785 Hyperlipidemia, unspecified: Secondary | ICD-10-CM | POA: Diagnosis present

## 2015-06-01 DIAGNOSIS — J810 Acute pulmonary edema: Secondary | ICD-10-CM | POA: Diagnosis not present

## 2015-06-01 DIAGNOSIS — J90 Pleural effusion, not elsewhere classified: Secondary | ICD-10-CM | POA: Diagnosis not present

## 2015-06-01 DIAGNOSIS — J189 Pneumonia, unspecified organism: Secondary | ICD-10-CM | POA: Diagnosis not present

## 2015-06-01 DIAGNOSIS — I713 Abdominal aortic aneurysm, ruptured: Secondary | ICD-10-CM | POA: Diagnosis not present

## 2015-06-01 DIAGNOSIS — D509 Iron deficiency anemia, unspecified: Secondary | ICD-10-CM | POA: Diagnosis not present

## 2015-06-01 DIAGNOSIS — Q2733 Arteriovenous malformation of digestive system vessel: Secondary | ICD-10-CM | POA: Diagnosis not present

## 2015-06-01 DIAGNOSIS — I120 Hypertensive chronic kidney disease with stage 5 chronic kidney disease or end stage renal disease: Secondary | ICD-10-CM | POA: Diagnosis present

## 2015-06-01 DIAGNOSIS — N186 End stage renal disease: Secondary | ICD-10-CM | POA: Diagnosis present

## 2015-06-01 DIAGNOSIS — D500 Iron deficiency anemia secondary to blood loss (chronic): Secondary | ICD-10-CM | POA: Diagnosis not present

## 2015-06-01 DIAGNOSIS — Z298 Encounter for other specified prophylactic measures: Secondary | ICD-10-CM | POA: Diagnosis not present

## 2015-06-01 DIAGNOSIS — K5900 Constipation, unspecified: Secondary | ICD-10-CM | POA: Diagnosis not present

## 2015-06-01 DIAGNOSIS — R6520 Severe sepsis without septic shock: Secondary | ICD-10-CM | POA: Diagnosis not present

## 2015-06-01 DIAGNOSIS — J441 Chronic obstructive pulmonary disease with (acute) exacerbation: Secondary | ICD-10-CM | POA: Diagnosis not present

## 2015-06-01 DIAGNOSIS — T814XXA Infection following a procedure, initial encounter: Secondary | ICD-10-CM | POA: Diagnosis not present

## 2015-06-01 DIAGNOSIS — D631 Anemia in chronic kidney disease: Secondary | ICD-10-CM | POA: Diagnosis present

## 2015-06-01 DIAGNOSIS — M6281 Muscle weakness (generalized): Secondary | ICD-10-CM | POA: Diagnosis not present

## 2015-06-01 DIAGNOSIS — I214 Non-ST elevation (NSTEMI) myocardial infarction: Secondary | ICD-10-CM | POA: Diagnosis not present

## 2015-06-01 DIAGNOSIS — I5032 Chronic diastolic (congestive) heart failure: Secondary | ICD-10-CM | POA: Diagnosis present

## 2015-06-01 DIAGNOSIS — Z8701 Personal history of pneumonia (recurrent): Secondary | ICD-10-CM | POA: Diagnosis not present

## 2015-06-01 DIAGNOSIS — H6593 Unspecified nonsuppurative otitis media, bilateral: Secondary | ICD-10-CM | POA: Diagnosis not present

## 2015-06-01 DIAGNOSIS — R079 Chest pain, unspecified: Secondary | ICD-10-CM | POA: Diagnosis not present

## 2015-06-01 DIAGNOSIS — Z8679 Personal history of other diseases of the circulatory system: Secondary | ICD-10-CM | POA: Diagnosis not present

## 2015-06-01 DIAGNOSIS — I10 Essential (primary) hypertension: Secondary | ICD-10-CM | POA: Diagnosis not present

## 2015-06-01 DIAGNOSIS — F419 Anxiety disorder, unspecified: Secondary | ICD-10-CM | POA: Diagnosis not present

## 2015-06-01 DIAGNOSIS — I5030 Unspecified diastolic (congestive) heart failure: Secondary | ICD-10-CM | POA: Diagnosis not present

## 2015-06-01 DIAGNOSIS — J449 Chronic obstructive pulmonary disease, unspecified: Secondary | ICD-10-CM | POA: Diagnosis present

## 2015-06-01 DIAGNOSIS — T148 Other injury of unspecified body region: Secondary | ICD-10-CM | POA: Diagnosis not present

## 2015-06-01 DIAGNOSIS — Z992 Dependence on renal dialysis: Secondary | ICD-10-CM | POA: Diagnosis not present

## 2015-06-01 DIAGNOSIS — S301XXD Contusion of abdominal wall, subsequent encounter: Secondary | ICD-10-CM | POA: Diagnosis not present

## 2015-06-01 DIAGNOSIS — D62 Acute posthemorrhagic anemia: Secondary | ICD-10-CM | POA: Diagnosis not present

## 2015-06-01 DIAGNOSIS — R062 Wheezing: Secondary | ICD-10-CM | POA: Diagnosis not present

## 2015-06-01 DIAGNOSIS — R0602 Shortness of breath: Secondary | ICD-10-CM | POA: Diagnosis not present

## 2015-06-01 DIAGNOSIS — J310 Chronic rhinitis: Secondary | ICD-10-CM | POA: Diagnosis not present

## 2015-06-01 DIAGNOSIS — A419 Sepsis, unspecified organism: Secondary | ICD-10-CM | POA: Diagnosis not present

## 2015-06-01 DIAGNOSIS — Z23 Encounter for immunization: Secondary | ICD-10-CM | POA: Diagnosis not present

## 2015-06-01 DIAGNOSIS — E861 Hypovolemia: Secondary | ICD-10-CM | POA: Diagnosis present

## 2015-06-01 LAB — PHOSPHORUS: PHOSPHORUS: 2.7 mg/dL (ref 2.5–4.6)

## 2015-06-01 LAB — BASIC METABOLIC PANEL
ANION GAP: 9 (ref 5–15)
BUN: 24 mg/dL — ABNORMAL HIGH (ref 6–20)
CALCIUM: 8.8 mg/dL — AB (ref 8.9–10.3)
CO2: 28 mmol/L (ref 22–32)
CREATININE: 3.5 mg/dL — AB (ref 0.44–1.00)
Chloride: 98 mmol/L — ABNORMAL LOW (ref 101–111)
GFR, EST AFRICAN AMERICAN: 15 mL/min — AB (ref >60)
GFR, EST NON AFRICAN AMERICAN: 13 mL/min — AB (ref >60)
Glucose, Bld: 94 mg/dL (ref 65–99)
Potassium: 3.8 mmol/L (ref 3.5–5.1)
SODIUM: 135 mmol/L (ref 135–145)

## 2015-06-01 LAB — MAGNESIUM: MAGNESIUM: 1.8 mg/dL (ref 1.7–2.4)

## 2015-06-01 MED ORDER — HYDROCODONE-ACETAMINOPHEN 5-325 MG PO TABS: 1.0000 | ORAL_TABLET | ORAL | Status: DC | PRN

## 2015-06-01 MED ORDER — MOMETASONE FURO-FORMOTEROL FUM 100-5 MCG/ACT IN AERO: 2.0000 | INHALATION_SPRAY | Freq: Two times a day (BID) | RESPIRATORY_TRACT | Status: DC

## 2015-06-01 MED ORDER — DEXTROSE 5 % IV SOLN: 1.0000 g | INTRAVENOUS | Status: DC | PRN

## 2015-06-01 MED ORDER — LINEZOLID 600 MG PO TABS: 600.0000 mg | ORAL_TABLET | Freq: Two times a day (BID) | ORAL | Status: DC

## 2015-06-01 MED ORDER — LINEZOLID 600 MG PO TABS
600.0000 mg | ORAL_TABLET | Freq: Two times a day (BID) | ORAL | Status: DC
  Filled 2015-06-01 (×3): qty 1

## 2015-06-01 MED ORDER — ALBUMIN HUMAN 25 % IV SOLN
25.0000 g | Freq: Once | INTRAVENOUS | Status: DC
  Filled 2015-06-01: qty 100

## 2015-06-01 MED ORDER — ALPRAZOLAM 0.25 MG PO TABS: 0.2500 mg | ORAL_TABLET | Freq: Two times a day (BID) | ORAL | Status: DC | PRN

## 2015-06-01 NOTE — Progress Notes (Signed)
Patient has orders for d/c to SNF. Dr Fritzi Mandes completed and signed d/c summary and order. All d/c information faxed to [facility] [phone]. Patient leaving by EMS. SW completed EMS packet and placed hard script in packet. RN Ander Purpura (Ext [phone]) was notified that packet was ready and placed next to patients chart. Nurse was provided Rm # [id number] and contact # for report [phone]. Patient and spouse Madai Hernandezgarci was notified of discharge at bedside. No further identified needs.    Anitra Lauth, BSW, MSW, [place] Work Dept [phone]

## 2015-06-01 NOTE — Clinical Social Work Placement (Signed)
   CLINICAL SOCIAL WORK PLACEMENT  NOTE  Date:  06/01/2015  Patient Details  Name: [name] MRN: [id number] Date of Birth: 01-01-1951  Clinical Social Work is seeking post-discharge placement for this patient at the [place] level of care (*CSW will initial, date and re-position this form in  chart as items are completed):  Yes   Patient/family provided with [place] Work Department's list of facilities offering this level of care within the geographic area requested by the patient (or if unable, by the patient's family).  Yes   Patient/family informed of their freedom to choose among providers that offer the needed level of care, that participate in Medicare, Medicaid or managed care program needed by the patient, have an available bed and are willing to accept the patient.      Patient/family informed of [facility]'s ownership interest in [facility] and [facility], as well as of the fact that they are under no obligation to receive care at these facilities.  PASRR submitted to EDS on       PASRR number received on 05/21/15     Existing PASRR number confirmed on       FL2 transmitted to all facilities in geographic area requested by pt/family on 05/21/15     FL2 transmitted to all facilities within larger geographic area on       Patient informed that his/her managed care company has contracts with or will negotiate with certain facilities, including the following:            Patient/family informed of bed offers received.  Patient chooses bed at  [facility])     Physician recommends and patient chooses bed at      Patient to be transferred to  [facility]) on 06/01/15.  Patient to be transferred to facility by  (EMS)     Patient family notified on 06/01/15 of transfer.  Name of family member notified:  Daylene Posey (spouse)      PHYSICIAN       Additional Comment:     _______________________________________________ Micah Flesher, LCSW 06/01/2015, 3:32 PM

## 2015-06-01 NOTE — Progress Notes (Signed)
PT Cancellation Note  Patient Details Name: COLLEENE AMSLER MRN: [id number] DOB: November 15, 1950   Cancelled Treatment:    Reason Eval/Treat Not Completed: PT screened, no needs identified, will sign off:  Pt in dialysis this AM, has returned and is now getting ready for d/c.  Discharge orders in chart.  Chart reviewed and spoke with RN who did not identify any immediate needs.  An Lannan A Prudence Heiny, PT 06/01/2015, 3:42 PM

## 2015-06-01 NOTE — Progress Notes (Signed)
RN called to give report to nurse at [facility]. EMS called. Telemetry and IV was removed. Now just awaiting EMS.   [name]

## 2015-06-01 NOTE — Progress Notes (Signed)
HD tx start 

## 2015-06-01 NOTE — Progress Notes (Signed)
Subjective:  Patient seen during dialysis Tolerating well    HEMODIALYSIS FLOWSHEET:  Blood Flow Rate (mL/min): 400 mL/min Arterial Pressure (mmHg): -140 mmHg Venous Pressure (mmHg): 90 mmHg Transmembrane Pressure (mmHg): 40 mmHg Ultrafiltration Rate (mL/min): 570 mL/min Dialysate Flow Rate (mL/min): 800 ml/min Conductivity: Machine : 14.2 Conductivity: Machine : 14.2 Dialysis Fluid Bolus: Normal Saline Bolus Amount (mL): 250 mL Intra-Hemodialysis Comments: 1198 (PT RESTING, LINES SECURE)     Objective:  Vital signs in last 24 hours:  Temp:  [97.9 F (36.6 C)-98.3 F (36.8 C)] 98 F (36.7 C) (03/04 0910) Pulse Rate:  [72-109] 95 (03/04 1130) Resp:  [11-24] 16 (03/04 1130) BP: (107-148)/(56-74) 121/73 mmHg (03/04 1130) SpO2:  [96 %-100 %] 97 % (03/04 1130) Weight:  [54.477 kg (120 lb 1.6 oz)] 54.477 kg (120 lb 1.6 oz) (03/04 0910)  Weight change: -2.123 kg (-4 lb 10.9 oz) Filed Weights   05/31/15 0528 06/01/15 0529 06/01/15 0910  Weight: 53.706 kg (118 lb 6.4 oz) 54.477 kg (120 lb 1.6 oz) 54.477 kg (120 lb 1.6 oz)    Intake/Output:    Intake/Output Summary (Last 24 hours) at 06/01/15 1140 Last data filed at 05/31/15 2237  Gross per 24 hour  Intake     50 ml  Output     50 ml  Net      0 ml     Physical Exam: General: no acute distress, lying in the bed  HEENT Anicteric  Neck supple  Pulm/lungs Coarse b/l, Pine oxygen  CVS/Heart Irregular rhythm, no rubs  Abdomen:  Soft, nontender  Extremities: Trace periphearl edema, wound vac in right groin  Neurologic: Alert, oriented x 3, follows commands  Skin: Scattered ecchymosis  Access: Right IJ PermCath       Basic Metabolic Panel:   Recent Labs Lab 05/27/15 0440 05/28/15 0400 05/29/15 0451 05/30/15 0448 05/31/15 0527 06/01/15 0555  NA 137  --  138 138 141 135  K 3.4* 3.7 4.3 4.8 3.9 3.8  CL 99*  --  102 102 101 98*  CO2 29  --  31 29 29 28   GLUCOSE 94  --  87 96 87 94  BUN 19  --  12 21* 15  24*  CREATININE 3.23*  --  2.61* 3.46* 2.60* 3.50*  CALCIUM 8.2*  --  8.4* 8.4* 8.9 8.8*  MG 2.0  --  1.9 1.9 1.9 1.8  PHOS  --   --  2.9 4.1 2.5 2.7     CBC:  Recent Labs Lab 05/27/15 0440 05/29/15 0451 05/30/15 0448  WBC 6.6 6.1 9.6  HGB 6.7* 6.7* 8.3*  HCT 19.9* 19.5* 23.8*  MCV 95.6 97.5 91.5  PLT 238 221 238      Microbiology:  Recent Results (from the past 720 hour(s))  Wound culture     Status: None   Collection Time: 05/20/15 12:25 PM  Result Value Ref Range Status   Specimen Description GROIN RIGHT  Final   Special Requests NONE  Final   Gram Stain   Final    FEW WBC SEEN FEW GRAM NEGATIVE RODS RARE GRAM POSITIVE COCCI    Culture   Final    RARE PSEUDOMONAS AERUGINOSA LIGHT GROWTH VANCOMYCIN RESISTANT ENTEROCOCCUS    Report Status 05/25/2015 FINAL  Final   Organism ID, Bacteria PSEUDOMONAS AERUGINOSA  Final   Organism ID, Bacteria VANCOMYCIN RESISTANT ENTEROCOCCUS  Final      Susceptibility   Pseudomonas aeruginosa - MIC*    CEFTAZIDIME 4 SENSITIVE  Sensitive     CIPROFLOXACIN 1 SENSITIVE Sensitive     GENTAMICIN <=1 SENSITIVE Sensitive     IMIPENEM 1 SENSITIVE Sensitive     CEFEPIME 4 SENSITIVE Sensitive     * RARE PSEUDOMONAS AERUGINOSA   Vancomycin resistant enterococcus - MIC*    AMPICILLIN Value in next row Resistant      RESISTANT>=32    VANCOMYCIN Value in next row Resistant      RESISTANT>=32    GENTAMICIN SYNERGY Value in next row Sensitive      RESISTANT>=32    LINEZOLID Value in next row Sensitive      SENSITIVE2    * LIGHT GROWTH VANCOMYCIN RESISTANT ENTEROCOCCUS  MRSA PCR Screening     Status: None   Collection Time: 05/20/15 12:30 PM  Result Value Ref Range Status   MRSA by PCR NEGATIVE NEGATIVE Final    Comment:        The GeneXpert MRSA Assay (FDA approved for NASAL specimens only), is one component of a comprehensive MRSA colonization surveillance program. It is not intended to diagnose MRSA infection nor to guide  or monitor treatment for MRSA infections.   Anaerobic culture     Status: None   Collection Time: 05/24/15  8:31 PM  Result Value Ref Range Status   Specimen Description GROIN RIGHT  Final   Special Requests NONE  Final   Culture NO ANAEROBES ISOLATED  Final   Report Status 05/28/2015 FINAL  Final  Wound culture     Status: None   Collection Time: 05/24/15  9:13 PM  Result Value Ref Range Status   Specimen Description GROIN RIGHT  Final   Special Requests NONE  Final   Gram Stain FEW WBC SEEN MANY GRAM POSITIVE COCCI IN PAIRS   Final   Culture HEAVY GROWTH VANCOMYCIN RESISTANT ENTEROCOCCUS  Final   Report Status 05/27/2015 FINAL  Final   Organism ID, Bacteria VANCOMYCIN RESISTANT ENTEROCOCCUS  Final      Susceptibility   Vancomycin resistant enterococcus - MIC*    AMPICILLIN >=32 RESISTANT Resistant     VANCOMYCIN >=32 RESISTANT Resistant     GENTAMICIN SYNERGY SENSITIVE Sensitive     LINEZOLID 2 SENSITIVE Sensitive     * HEAVY GROWTH VANCOMYCIN RESISTANT ENTEROCOCCUS    Coagulation Studies: No results for input(s): LABPROT, INR in the last 72 hours.  Urinalysis: No results for input(s): COLORURINE, LABSPEC, PHURINE, GLUCOSEU, HGBUR, BILIRUBINUR, KETONESUR, PROTEINUR, UROBILINOGEN, NITRITE, LEUKOCYTESUR in the last 72 hours.  Invalid input(s): APPERANCEUR    Imaging: X-ray Chest Pa Or Ap  05/31/2015  CLINICAL DATA:  65 year old female status post right-sided thoracentesis EXAM: CHEST 1 VIEW COMPARISON:  Prior chest x-ray 05/29/2015 FINDINGS: No evidence of right-sided pneumothorax. Significant interval decrease in right pleural effusion. There may be a trace right pleural effusion left. Right IJ approach tunneled hemodialysis catheter with the tip overlying the distal SVC. Persistent moderate to large layering left pleural effusion and associated left basilar atelectasis. Reduced right lung atelectasis. Stable cardiac and mediastinal contours with aortic atherosclerosis.  Incompletely imaged aortic stent graft. IMPRESSION: No evidence of pneumothorax or complication post right-sided thoracentesis. Significant interval reduction in the right-sided pleural effusion. Persistent moderate layering left pleural effusion. Electronically Signed   By: Jacqulynn Cadet M.D.   On: 05/31/2015 12:17   Ct Chest Wo Contrast  05/30/2015  CLINICAL DATA:  Shortness of Breath EXAM: CT CHEST WITHOUT CONTRAST TECHNIQUE: Multidetector CT imaging of the chest was performed following the standard protocol without  IV contrast. COMPARISON:  05/29/2015, 07/19/2014 FINDINGS: Bilateral large pleural effusions are identified. Mild interstitial thickening is noted likely related to a degree of edema. Some lingular infiltrate as well as bilateral lower lobe consolidation is noted. No focal parenchymal nodule is seen. The thoracic inlet shows evidence of a right jugular dialysis catheter. Aortic calcification is noted as well as of its branches. No aneurysmal dilatation is seen. Coronary calcifications are noted. No hilar or mediastinal adenopathy is noted at this time although the lack of IV contrast somewhat limits the exam. The visualized upper abdomen is within normal limits. There are changes consistent with an aortic stent graft also incompletely evaluated with the lack of IV contrast. The bony structures show no acute abnormality. IMPRESSION: Bilateral pleural effusions with associated lower lobe consolidation and multiple lingular infiltrate. Electronically Signed   By: Inez Catalina M.D.   On: 05/30/2015 14:22   US Thoracentesis Asp Pleural Space W/img Guide  05/31/2015  INDICATION: 65 year old female with shortness of breath and bilateral moderate to large layering pleural effusions. EXAM: ULTRASOUND GUIDED RIGHT THORACENTESIS MEDICATIONS: None. COMPLICATIONS: None immediate. PROCEDURE: An ultrasound guided thoracentesis was thoroughly discussed with the patient and questions answered. The benefits,  risks, alternatives and complications were also discussed. The patient understands and wishes to proceed with the procedure. Written consent was obtained. Ultrasound was performed to localize and mark an adequate pocket of fluid in the right chest. The area was then prepped and draped in the normal sterile fashion. 1% Lidocaine was used for local anesthesia. Under ultrasound guidance a 6 Fr Safe-T-Centesis catheter was introduced. Thoracentesis was performed. The catheter was removed and a dressing applied. FINDINGS: A total of approximately 1700 mL of amber colored pleural fluid was removed. IMPRESSION: Successful ultrasound guided right thoracentesis yielding 1.7 L of pleural fluid. Electronically Signed   By: Jacqulynn Cadet M.D.   On: 05/31/2015 12:22     Medications:   . nitroGLYCERIN Stopped (05/30/15 0700)   . acetaminophen  1,000 mg Oral 3 times per day  . albumin human  25 g Intravenous Once  . antiseptic oral rinse  7 mL Mouth Rinse BID  . aspirin EC  81 mg Oral Daily  . cefTAZidime (FORTAZ)  IV  1 g Intravenous Q24H  . docusate sodium  100 mg Oral BID  . famotidine  20 mg Oral Q48H  . fluticasone  2 spray Each Nare Daily  . ipratropium-albuterol  3 mL Nebulization TID  . levothyroxine  88 mcg Oral Daily  . linezolid  600 mg Oral Q12H  . metoprolol  25 mg Oral BID  . mometasone-formoterol  2 puff Inhalation BID  . multivitamin  1 tablet Oral Daily  . potassium chloride  40 mEq Oral Once  . sodium chloride  1 spray Each Nare 5 X Daily   acetaminophen **OR** acetaminophen, albuterol, ALPRAZolam, anticoagulant sodium citrate, bisacodyl, chlorpheniramine-HYDROcodone, guaiFENesin-dextromethorphan, HYDROcodone-acetaminophen, loperamide, magnesium hydroxide, morphine injection, ondansetron **OR** ondansetron (ZOFRAN) IV, ondansetron, promethazine, senna-docusate  Assessment/ Plan:  65 y.o. female with COPD, hypertension, hyperlipidemia, vaginal intraepithelial neoplasia, emergent AAA  repair status post rupture on November 18, left to right femoral to femoral bypass , acute renal failure leading to permanent dialysis, significant 3 vessel disease,   1. End-stage renal disease. [place]. TTS. [facility] nephrology - Dialysis today - Goal 2-2.5 L removed  - with iv albumin support  2. SOB - Pleural effusions contributing  -  S/p thoracentesis rt chest - 1.7 Liters   3. Peripheral  edema:  - from 3rd spacing   4. Anemia of chronic kidney disease:   - Most recent hemoglobin at 8.3.   .  5. Secondary Hyperparathyroidism:   - monitor phos during admission. - 2.7  6. 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   Pack cathter with citrate  7. s/p Incision and drainage of recurrent right groin seroma 05/24/15 VRE and Pseudomonas infection - ID recommended Daptomycin which is not available on outpatient dialysis formulary - 2nd option LINEZOLID 600 BID orally - CEFTAZIDIME 1 gm iv given on dialysis Will order ceftaz for 6 weeks. Patient will f/u with ID to determine the final duration of antibiotics - orders given to nurse Florstein at [facility] rd dialyssi  Discussed with patient and Dr Fritzi Mandes     LOS: 12 [place] 3/4/201711:40 AM

## 2015-06-01 NOTE — Discharge Summary (Signed)
[facility] [place] NAME: [name]    MR#:  [id number]  DATE OF BIRTH:  1950/12/24  DATE OF ADMISSION:  05/20/2015 ADMITTING PHYSICIAN: Fritzi Mandes, MD  DATE OF DISCHARGE: 06/01/15  PRIMARY CARE PHYSICIAN: Arnette Norris, MD    ADMISSION DIAGNOSIS:  Sepsis, due to unspecified organism [facility]) [A41.9] Aspiration pneumonia of right lower lobe, unspecified aspiration pneumonia type ([place]) [J69.0]  DISCHARGE DIAGNOSIS:  Sepsis secondary to bilateral pneumonia and right groin seroma infection status post I and D and wound VAC placement Right groin seroma, VRE-Pseudomonas infection now on IV ceftazidime and by mouth linezolid End-stage renal disease on hemodialysis Acute on chronic diastolic congestive heart failure with large right-sided pleural effusion status post thoracentesis 05/31/2015 Hypertension History of HIT Anemia Chronic disease status post 1 unit of blood transfusion   SECONDARY DIAGNOSIS:   Past Medical History  Diagnosis Date  . Hyperlipidemia   . Hypertension   . Thyroid disease   . GERD (gastroesophageal reflux disease)   . Aneurysm ([place])     abdominal aortic  . STD (sexually transmitted disease)     chlamydia  . VAIN II (vaginal intraepithelial neoplasia grade II) 7/09  . 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 B12 deficiency   . COPD (chronic obstructive pulmonary disease) ([place])   . Renal insufficiency   . Cancer ([place])     skin on left ankle 04/10/15 pt states she has never had cancer    HOSPITAL COURSE:   [name] is a 65 y.o. female with a known history of chronic diastolic congestive heart failure, incisional disease on hemodialysis, history of HIT,, anemia of chronic disease, history of right groin seroma after vascular procedures status post wound VAC placement in January 2017 comes to the emergency room from [place] with fever of 102.3 shortness of breath. Patient  was found to have sepsis with right lower lobe pneumonia. She was found to be hypoxic with sats 72% on ABG.  #Acute respiratory distress probably from pleural effusions Resolved patient's sats are 100% on 2 L. -Thoracentesis right side removal of 1.7 L of fluid. Patient still has moderate left-sided pleural effusion. Discussed CT scan and off chest x-ray results with Dr. Yolanda Bonine interventional radiology and since patient is hemodynamically stable and respiratory status stable no indication for urgent thoracentesis this was discussed with patient and her husband are agreeable to it.   #. Sepsis secondary to bilateral lobe pneumonia hospital-acquired (patient was admitted and discharge in January 2017) and severe right groin infection of seroma recurrent -wound cx 2/24 with pseudomonas and VRE, ID recommending Daptomycin and Ceftazidime q 48 hrs during HD -After discussing with Dr. Ola Spurr antibiotics have been changed to by mouth linezolid 600 mg twice a day and continue ceftazidime at dialysis. Per Dr. Candiss Norse daptomycin may not be available to be given at dialysis hence the change was made. - WOUND VACInstructions per vascular  -Uncertain if the grafts are infected but if so would need long term suppressive abx, ID will d/w dr.Schnier.  -f/u with Dr. Murlean Iba from vascular surgery , s/p I and D and wound VAC 2/24, vascular is okay to discharge the patient from the standpoint -Appreciated ID consult regarding right groin infection  2. Acute on chronic diastolic congestive heart failure with large bilateral pleural effusions - on hemodialysis with ultrafiltration -Continue cardiac meds  3. Hypertension Continue amlodipine, metoprolol, Avapro, hydralazine  4. History of HIT -Avoid antiplatelet  agents -SCD and teds for DVT prophylaxis  5. End-stage renal disease on hemodialysisResume hemodialysis at discharge   6. Hyperlipidemia continue statins  7. Anemia of chronic disease   Hemoglobin is 6.7 -8.3, s/p 1 unit of blood   8. Bilateral Pleural effusions on CT chest Status post thoracentesis right side removal of 1.7 L fluid   PT consult for deconditioning Patient has multiple medical problems she is best as her baseline. Spoke with Education officer, museum in short wound VAC is available at [facility] she said yes and patient could be discharged to [address] care today.  CONSULTS OBTAINED:  Treatment Team:  Lavonia Dana, MD Murlean Iba, MD Adrian Prows, MD  DRUG ALLERGIES:   Allergies  Allergen Reactions  . Asa [Aspirin]   . 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 Plavix [Clopidogrel]     DISCHARGE MEDICATIONS:   Current Discharge Medication List    START taking these medications   Details  ALPRAZolam (XANAX) 0.25 MG tablet Take 1 tablet (0.25 mg total) by mouth 2 (two) times daily as needed for anxiety. Qty: 30 tablet, Refills: 0    cefTAZidime 1 g in dextrose 5 % 50 mL Inject 1 g into the vein every dialysis. Qty: 20 ampule, Refills: 0    HYDROcodone-acetaminophen (NORCO/VICODIN) 5-325 MG tablet Take 1-2 tablets by mouth every 4 (four) hours as needed for moderate pain. Qty: 30 tablet, Refills: 0    linezolid (ZYVOX) 600 MG tablet Take 1 tablet (600 mg total) by mouth every 12 (twelve) hours. Qty: 60 tablet, Refills: 0    mometasone-formoterol (DULERA) 100-5 MCG/ACT AERO Inhale 2 puffs into the lungs 2 (two) times daily. Qty: 1 Inhaler, Refills: 0      CONTINUE these medications which have NOT CHANGED   Details  acetaminophen (TYLENOL) 500 MG tablet Take 1,000 mg by mouth every 8 (eight) hours.    albuterol (PROVENTIL) (2.5 MG/3ML) 0.083% nebulizer solution Take 2.5 mg by nebulization every 6 (six) hours as needed for wheezing.    amLODipine (NORVASC) 5 MG tablet Take 1 tablet (5 mg total) by mouth daily. Qty: 30 tablet, Refills: 6    aspirin EC 81 MG tablet Take 81 mg by mouth daily.     atorvastatin (LIPITOR) 40 MG tablet Take 40 mg by mouth daily.    cefTRIAXone (ROCEPHIN) 1-3.74 GM-% IVPB Inject 1 g into the vein daily.    chlorpheniramine-HYDROcodone (TUSSIONEX) 10-8 MG/5ML SUER Take 5 mLs by mouth every 12 (twelve) hours as needed for cough. Qty: 140 mL, Refills: 0    docusate sodium (COLACE) 100 MG capsule Take 100 mg by mouth 2 (two) times daily.     famotidine (PEPCID) 20 MG tablet Take 20 mg by mouth daily.    fluticasone (FLONASE) 50 MCG/ACT nasal spray Place 2 sprays into both nostrils daily.    fluticasone furoate-vilanterol (BREO ELLIPTA) 100-25 MCG/INH AEPB Inhale 1 puff into the lungs daily.    guaiFENesin-dextromethorphan (ROBITUSSIN DM) 100-10 MG/5ML syrup Take 10 mLs by mouth every 4 (four) hours as needed for cough. Qty: 118 mL, Refills: 0    hydrALAZINE (APRESOLINE) 10 MG tablet Take 10 mg by mouth every 6 (six) hours.    irbesartan (AVAPRO) 75 MG tablet Take 1 tablet (75 mg total) by mouth 2 (two) times daily. Qty: 30 tablet, Refills: 5    levothyroxine (SYNTHROID, LEVOTHROID) 88 MCG tablet Take 88 mcg by mouth daily.    loperamide (IMODIUM) 2 MG  capsule Take 2 mg by mouth every 4 (four) hours as needed for diarrhea or loose stools.    magnesium hydroxide (MILK OF MAGNESIA) 400 MG/5ML suspension Take 30 mLs by mouth daily as needed for mild constipation.    metoprolol (LOPRESSOR) 50 MG tablet Take 1 tablet (50 mg total) by mouth every 8 (eight) hours. Qty: 90 tablet, Refills: 5    multivitamin (RENA-VIT) TABS tablet Take 1 tablet by mouth daily.    ondansetron (ZOFRAN) 4 MG tablet Take 4 mg by mouth every 6 (six) hours as needed for nausea or vomiting.    promethazine (PHENERGAN) 25 MG/ML injection Inject 25 mg into the vein every 6 (six) hours as needed for nausea or vomiting.    sodium chloride (OCEAN) 0.65 % SOLN nasal spray Place 1 spray into both nostrils 5 (five) times daily.      STOP taking these medications     doxycycline  (VIBRA-TABS) 100 MG tablet         If you experience worsening of your admission symptoms, develop shortness of breath, life threatening emergency, suicidal or homicidal thoughts you must seek medical attention immediately by calling 911 or calling your MD immediately  if symptoms less severe.  You Must read complete instructions/literature along with all the possible adverse reactions/side effects for all the Medicines you take and that have been prescribed to you. Take any new Medicines after you have completely understood and accept all the possible adverse reactions/side effects.   Please note  You were cared for by a hospitalist during your hospital stay. If you have any questions about your discharge medications or the care you received while you were in the hospital after you are discharged, you can call the unit and asked to speak with the hospitalist on call if the hospitalist that took care of you is not available. Once you are discharged, your primary care physician will handle any further medical issues. Please note that NO REFILLS for any discharge medications will be authorized once you are discharged, as it is imperative that you return to your primary care physician (or establish a relationship with a primary care physician if you do not have one) for your aftercare needs so that they can reassess your need for medications and monitor your lab values. Today   SUBJECTIVE     VITAL SIGNS:  Blood pressure 120/66, pulse 94, temperature 98 F (36.7 C), temperature source Oral, resp. rate 16, height 5\' 4"  (1.626 m), weight 54.477 kg (120 lb 1.6 oz), last menstrual period 03/30/1988, SpO2 99 %.  I/O:   Intake/Output Summary (Last 24 hours) at 06/01/15 1003 Last data filed at 05/31/15 2237  Gross per 24 hour  Intake     50 ml  Output     50 ml  Net      0 ml    PHYSICAL EXAMINATION:  GENERAL:  65 y.o.-year-old patient lying in the bed with no acute distress.  EYES: Pupils  equal, round, reactive to light and accommodation. No scleral icterus. Extraocular muscles intact.  HEENT: Head atraumatic, normocephalic. Oropharynx and nasopharynx clear.  NECK:  Supple, no jugular venous distention. No thyroid enlargement, no tenderness.  LUNGSDecreased breath sounds bilaterally, no wheezing, rales,rhonchi or crepitation. No use of accessory muscles of respiration.  HD catheter  CARDIOVASCULAR: S1, S2 normal. No murmurs, rubs, or gallops.  ABDOMEN: Soft, non-tender, non-distended. Bowel sounds present. No organomegaly or mass.  EXTREMITIES: No pedal edema, cyanosis, or clubbing.  NEUROLOGIC: Cranial nerves  II through XII are intact. Muscle strength 5/5 in all extremities. Sensation intact. Gait not checked.  PSYCHIATRIC: The patient is alert and oriented x 3.  SKIN: No obvious rash, lesion, or ulcer.   DATA REVIEW:   CBC   Recent Labs Lab 05/30/15 0448  WBC 9.6  HGB 8.3*  HCT 23.8*  PLT 238    Chemistries   Recent Labs Lab 06/01/15 0555  NA 135  K 3.8  CL 98*  CO2 28  GLUCOSE 94  BUN 24*  CREATININE 3.50*  CALCIUM 8.8*  MG 1.8    Microbiology Results   Recent Results (from the past 240 hour(s))  Anaerobic culture     Status: None   Collection Time: 05/24/15  8:31 PM  Result Value Ref Range Status   Specimen Description GROIN RIGHT  Final   Special Requests NONE  Final   Culture NO ANAEROBES ISOLATED  Final   Report Status 05/28/2015 FINAL  Final  Wound culture     Status: None   Collection Time: 05/24/15  9:13 PM  Result Value Ref Range Status   Specimen Description GROIN RIGHT  Final   Special Requests NONE  Final   Gram Stain FEW WBC SEEN MANY GRAM POSITIVE COCCI IN PAIRS   Final   Culture HEAVY GROWTH VANCOMYCIN RESISTANT ENTEROCOCCUS  Final   Report Status 05/27/2015 FINAL  Final   Organism ID, Bacteria VANCOMYCIN RESISTANT ENTEROCOCCUS  Final      Susceptibility   Vancomycin resistant enterococcus - MIC*    AMPICILLIN >=32  RESISTANT Resistant     VANCOMYCIN >=32 RESISTANT Resistant     GENTAMICIN SYNERGY SENSITIVE Sensitive     LINEZOLID 2 SENSITIVE Sensitive     * HEAVY GROWTH VANCOMYCIN RESISTANT ENTEROCOCCUS    RADIOLOGY:  X-ray Chest Pa Or Ap  05/31/2015  CLINICAL DATA:  65 year old female status post right-sided thoracentesis EXAM: CHEST 1 VIEW COMPARISON:  Prior chest x-ray 05/29/2015 FINDINGS: No evidence of right-sided pneumothorax. Significant interval decrease in right pleural effusion. There may be a trace right pleural effusion left. Right IJ approach tunneled hemodialysis catheter with the tip overlying the distal SVC. Persistent moderate to large layering left pleural effusion and associated left basilar atelectasis. Reduced right lung atelectasis. Stable cardiac and mediastinal contours with aortic atherosclerosis. Incompletely imaged aortic stent graft. IMPRESSION: No evidence of pneumothorax or complication post right-sided thoracentesis. Significant interval reduction in the right-sided pleural effusion. Persistent moderate layering left pleural effusion. Electronically Signed   By: Jacqulynn Cadet M.D.   On: 05/31/2015 12:17   Ct Chest Wo Contrast  05/30/2015  CLINICAL DATA:  Shortness of Breath EXAM: CT CHEST WITHOUT CONTRAST TECHNIQUE: Multidetector CT imaging of the chest was performed following the standard protocol without IV contrast. COMPARISON:  05/29/2015, 07/19/2014 FINDINGS: Bilateral large pleural effusions are identified. Mild interstitial thickening is noted likely related to a degree of edema. Some lingular infiltrate as well as bilateral lower lobe consolidation is noted. No focal parenchymal nodule is seen. The thoracic inlet shows evidence of a right jugular dialysis catheter. Aortic calcification is noted as well as of its branches. No aneurysmal dilatation is seen. Coronary calcifications are noted. No hilar or mediastinal adenopathy is noted at this time although the lack of IV  contrast somewhat limits the exam. The visualized upper abdomen is within normal limits. There are changes consistent with an aortic stent graft also incompletely evaluated with the lack of IV contrast. The bony structures show no acute abnormality. IMPRESSION:  Bilateral pleural effusions with associated lower lobe consolidation and multiple lingular infiltrate. Electronically Signed   By: Inez Catalina M.D.   On: 05/30/2015 14:22   US Thoracentesis Asp Pleural Space W/img Guide  05/31/2015  INDICATION: 65 year old female with shortness of breath and bilateral moderate to large layering pleural effusions. EXAM: ULTRASOUND GUIDED RIGHT THORACENTESIS MEDICATIONS: None. COMPLICATIONS: None immediate. PROCEDURE: An ultrasound guided thoracentesis was thoroughly discussed with the patient and questions answered. The benefits, risks, alternatives and complications were also discussed. The patient understands and wishes to proceed with the procedure. Written consent was obtained. Ultrasound was performed to localize and mark an adequate pocket of fluid in the right chest. The area was then prepped and draped in the normal sterile fashion. 1% Lidocaine was used for local anesthesia. Under ultrasound guidance a 6 Fr Safe-T-Centesis catheter was introduced. Thoracentesis was performed. The catheter was removed and a dressing applied. FINDINGS: A total of approximately 1700 mL of amber colored pleural fluid was removed. IMPRESSION: Successful ultrasound guided right thoracentesis yielding 1.7 L of pleural fluid. Electronically Signed   By: Jacqulynn Cadet M.D.   On: 05/31/2015 12:22     Management plans discussed with the patient, family and they are in agreement.  CODE STATUS:     Code Status Orders        Start     Ordered   05/20/15 1114  Full code   Continuous     05/20/15 1113    Code Status History    Date Active Date Inactive Code Status Order ID Comments User Context   04/09/2015 10:24 AM 04/22/2015   9:15 PM Full Code [id number]  Theodoro Grist, MD Inpatient      TOTAL TIME TAKING CARE OF THIS PATIENT: 40 minutes.    Tylik Treese M.D on 06/01/2015 at 10:03 AM  Between 7am to 6pm - Pager - [phone] After 6pm go to [email or web address] - password [email or web address] [facility]  Office  [phone]  CC: Primary care physician; Arnette Norris, MD

## 2015-06-01 NOTE — Discharge Instructions (Signed)
[facility] Failure [facility] appointment on June 05, 2015 at 9:00am with Darylene Price, [place]. Please call [phone] to reschedule.   Continue wound VAC orders and instructions per Dr. Delana Meyer

## 2015-06-01 NOTE — Progress Notes (Signed)
MEDICATION RELATED CONSULT NOTE - Follow Up  Pharmacy Consult for Potassium  Indication: Potassium replacement.    Allergies  Allergen Reactions  . Asa [Aspirin]   . 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 Plavix [Clopidogrel]     Patient Measurements: Height: 5\' 4"  (162.6 cm) Weight: 120 lb 1.6 oz (54.477 kg) IBW/kg (Calculated) : 54.7  Vital Signs: Temp: 97.9 F (36.6 C) (03/04 0529) Temp Source: Oral (03/04 0529) BP: 145/66 mmHg (03/04 0529) Pulse Rate: 80 (03/04 0529) Intake/Output from previous day: 03/03 0701 - 03/04 0700 In: 50 [IV Piggyback:50] Out: 32 [Drains:50] Intake/Output from this shift: Total I/O In: 50 [IV Piggyback:50] Out: -   Labs:  Recent Labs  05/30/15 0448 05/31/15 0527 06/01/15 0555  WBC 9.6  --   --   HGB 8.3*  --   --   HCT 23.8*  --   --   PLT 238  --   --   CREATININE 3.46* 2.60* 3.50*  MG 1.9 1.9 1.8  PHOS 4.1 2.5 2.7   Estimated Creatinine Clearance: 13.6 mL/min (by C-G formula based on Cr of 3.5).   Microbiology: Recent Results (from the past 720 hour(s))  Wound culture     Status: None   Collection Time: 05/20/15 12:25 PM  Result Value Ref Range Status   Specimen Description GROIN RIGHT  Final   Special Requests NONE  Final   Gram Stain   Final    FEW WBC SEEN FEW GRAM NEGATIVE RODS RARE GRAM POSITIVE COCCI    Culture   Final    RARE PSEUDOMONAS AERUGINOSA LIGHT GROWTH VANCOMYCIN RESISTANT ENTEROCOCCUS    Report Status 05/25/2015 FINAL  Final   Organism ID, Bacteria PSEUDOMONAS AERUGINOSA  Final   Organism ID, Bacteria VANCOMYCIN RESISTANT ENTEROCOCCUS  Final      Susceptibility   Pseudomonas aeruginosa - MIC*    CEFTAZIDIME 4 SENSITIVE Sensitive     CIPROFLOXACIN 1 SENSITIVE Sensitive     GENTAMICIN <=1 SENSITIVE Sensitive     IMIPENEM 1 SENSITIVE Sensitive     CEFEPIME 4 SENSITIVE Sensitive     * RARE PSEUDOMONAS AERUGINOSA   Vancomycin resistant enterococcus - MIC*    AMPICILLIN Value  in next row Resistant      RESISTANT>=32    VANCOMYCIN Value in next row Resistant      RESISTANT>=32    GENTAMICIN SYNERGY Value in next row Sensitive      RESISTANT>=32    LINEZOLID Value in next row Sensitive      SENSITIVE2    * LIGHT GROWTH VANCOMYCIN RESISTANT ENTEROCOCCUS  MRSA PCR Screening     Status: None   Collection Time: 05/20/15 12:30 PM  Result Value Ref Range Status   MRSA by PCR NEGATIVE NEGATIVE Final    Comment:        The GeneXpert MRSA Assay (FDA approved for NASAL specimens only), is one component of a comprehensive MRSA colonization surveillance program. It is not intended to diagnose MRSA infection nor to guide or monitor treatment for MRSA infections.   Anaerobic culture     Status: None   Collection Time: 05/24/15  8:31 PM  Result Value Ref Range Status   Specimen Description GROIN RIGHT  Final   Special Requests NONE  Final   Culture NO ANAEROBES ISOLATED  Final   Report Status 05/28/2015 FINAL  Final  Wound culture     Status: None   Collection Time: 05/24/15  9:13  PM  Result Value Ref Range Status   Specimen Description GROIN RIGHT  Final   Special Requests NONE  Final   Gram Stain FEW WBC SEEN MANY GRAM POSITIVE COCCI IN PAIRS   Final   Culture HEAVY GROWTH VANCOMYCIN RESISTANT ENTEROCOCCUS  Final   Report Status 05/27/2015 FINAL  Final   Organism ID, Bacteria VANCOMYCIN RESISTANT ENTEROCOCCUS  Final      Susceptibility   Vancomycin resistant enterococcus - MIC*    AMPICILLIN >=32 RESISTANT Resistant     VANCOMYCIN >=32 RESISTANT Resistant     GENTAMICIN SYNERGY SENSITIVE Sensitive     LINEZOLID 2 SENSITIVE Sensitive     * HEAVY GROWTH VANCOMYCIN RESISTANT ENTEROCOCCUS    Medical History: Past Medical History  Diagnosis Date  . Hyperlipidemia   . Hypertension   . Thyroid disease   . GERD (gastroesophageal reflux disease)   . Aneurysm ([place])     abdominal aortic  . STD (sexually transmitted disease)     chlamydia  . VAIN II  (vaginal intraepithelial neoplasia grade II) 7/09  . 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 B12 deficiency   . COPD (chronic obstructive pulmonary disease) ([place])   . Renal insufficiency   . Cancer ([place])     skin on left ankle 04/10/15 pt states she has never had cancer    Medications:  Prescriptions prior to admission  Medication Sig Dispense Refill Last Dose  . acetaminophen (TYLENOL) 500 MG tablet Take 1,000 mg by mouth every 8 (eight) hours.   unknown at unknown  . albuterol (PROVENTIL) (2.5 MG/3ML) 0.083% nebulizer solution Take 2.5 mg by nebulization every 6 (six) hours as needed for wheezing.   prn at prn  . amLODipine (NORVASC) 5 MG tablet Take 1 tablet (5 mg total) by mouth daily. 30 tablet 6 unknown at unknown  . aspirin EC 81 MG tablet Take 81 mg by mouth daily.   unknown at unknown  . atorvastatin (LIPITOR) 40 MG tablet Take 40 mg by mouth daily.   unknown at unknown  . cefTRIAXone (ROCEPHIN) 1-3.74 GM-% IVPB Inject 1 g into the vein daily.   unknown at unknown  . chlorpheniramine-HYDROcodone (TUSSIONEX) 10-8 MG/5ML SUER Take 5 mLs by mouth every 12 (twelve) hours as needed for cough. 140 mL 0 prn at prn  . docusate sodium (COLACE) 100 MG capsule Take 100 mg by mouth 2 (two) times daily.    unknown at unknown  . doxycycline (VIBRA-TABS) 100 MG tablet Take 100 mg by mouth 2 (two) times daily. For 14 days   unknown at unknown  . famotidine (PEPCID) 20 MG tablet Take 20 mg by mouth daily.   unknown at unknown  . fluticasone (FLONASE) 50 MCG/ACT nasal spray Place 2 sprays into both nostrils daily.   unknown at unknown  . fluticasone furoate-vilanterol (BREO ELLIPTA) 100-25 MCG/INH AEPB Inhale 1 puff into the lungs daily.   unknown at unknown  . guaiFENesin-dextromethorphan (ROBITUSSIN DM) 100-10 MG/5ML syrup Take 10 mLs by mouth every 4 (four) hours as needed for cough. 118 mL 0 prn at prn  . hydrALAZINE (APRESOLINE) 10 MG tablet Take 10 mg by mouth every 6 (six)  hours.   unknown at unknown  . irbesartan (AVAPRO) 75 MG tablet Take 1 tablet (75 mg total) by mouth 2 (two) times daily. 30 tablet 5 unknown at unknown  . levothyroxine (SYNTHROID, LEVOTHROID) 88 MCG tablet Take 88 mcg by mouth daily.   unknown at unknown  .  loperamide (IMODIUM) 2 MG capsule Take 2 mg by mouth every 4 (four) hours as needed for diarrhea or loose stools.   prn at prn  . magnesium hydroxide (MILK OF MAGNESIA) 400 MG/5ML suspension Take 30 mLs by mouth daily as needed for mild constipation.   prn at prn  . metoprolol (LOPRESSOR) 50 MG tablet Take 1 tablet (50 mg total) by mouth every 8 (eight) hours. 90 tablet 5 unknown at unknown  . multivitamin (RENA-VIT) TABS tablet Take 1 tablet by mouth daily.   unknown at unknown  . ondansetron (ZOFRAN) 4 MG tablet Take 4 mg by mouth every 6 (six) hours as needed for nausea or vomiting.   prn at prn  . promethazine (PHENERGAN) 25 MG/ML injection Inject 25 mg into the vein every 6 (six) hours as needed for nausea or vomiting.   prn at prn  . sodium chloride (OCEAN) 0.65 % SOLN nasal spray Place 1 spray into both nostrils 5 (five) times daily.   unknown at unknown    Assessment: Pharmacy consulted to assist in managing electrolytes in this 65 y/o F admitted with sepsis due to B/L PNA.   K= 3.4, Mag= 2.0  Goal of Therapy:  K :  3.5 - 5.1   Plan:  Electrolytes WNL at this time Will recheck electrolytes tomorrow with AM labs.  Laural Benes, Pharm.D., BCPS Clinical Pharmacist 06/01/2015,6:33 AM

## 2015-06-01 NOTE — Progress Notes (Signed)
Pre-hd tx 

## 2015-06-01 NOTE — Progress Notes (Signed)
Pt prepared for d/c to SNF. IV d/c'd. Skin intact except as charted in most recent assessments. Vitals are stable. Report called to receiving facility. Pt to be transported by ambulance service. Transported by EMS  Angus Seller

## 2015-06-03 LAB — CYTOLOGY - NON PAP

## 2015-06-04 DIAGNOSIS — Z298 Encounter for other specified prophylactic measures: Secondary | ICD-10-CM | POA: Diagnosis not present

## 2015-06-04 DIAGNOSIS — R7881 Bacteremia: Secondary | ICD-10-CM | POA: Diagnosis not present

## 2015-06-04 DIAGNOSIS — N186 End stage renal disease: Secondary | ICD-10-CM | POA: Diagnosis not present

## 2015-06-04 DIAGNOSIS — D509 Iron deficiency anemia, unspecified: Secondary | ICD-10-CM | POA: Diagnosis not present

## 2015-06-04 DIAGNOSIS — Z23 Encounter for immunization: Secondary | ICD-10-CM | POA: Diagnosis not present

## 2015-06-04 DIAGNOSIS — T814XXA Infection following a procedure, initial encounter: Secondary | ICD-10-CM | POA: Diagnosis not present

## 2015-06-05 ENCOUNTER — Ambulatory Visit: Payer: Medicare Other | Admitting: Family

## 2015-06-05 ENCOUNTER — Telehealth: Payer: Self-pay | Admitting: Family

## 2015-06-05 NOTE — Telephone Encounter (Signed)
Patient missed her initial appointment at the [facility] on 06/05/15. Will attempt to reschedule.

## 2015-06-06 ENCOUNTER — Ambulatory Visit: Payer: Medicare Other | Admitting: Family

## 2015-06-06 DIAGNOSIS — R7881 Bacteremia: Secondary | ICD-10-CM | POA: Diagnosis not present

## 2015-06-06 DIAGNOSIS — T814XXA Infection following a procedure, initial encounter: Secondary | ICD-10-CM | POA: Diagnosis not present

## 2015-06-06 DIAGNOSIS — D509 Iron deficiency anemia, unspecified: Secondary | ICD-10-CM | POA: Diagnosis not present

## 2015-06-06 DIAGNOSIS — Z298 Encounter for other specified prophylactic measures: Secondary | ICD-10-CM | POA: Diagnosis not present

## 2015-06-06 DIAGNOSIS — N186 End stage renal disease: Secondary | ICD-10-CM | POA: Diagnosis not present

## 2015-06-06 DIAGNOSIS — Z23 Encounter for immunization: Secondary | ICD-10-CM | POA: Diagnosis not present

## 2015-06-06 LAB — BODY FLUID CULTURE: Culture: NO GROWTH

## 2015-06-07 DIAGNOSIS — Q2733 Arteriovenous malformation of digestive system vessel: Secondary | ICD-10-CM | POA: Diagnosis not present

## 2015-06-07 DIAGNOSIS — D500 Iron deficiency anemia secondary to blood loss (chronic): Secondary | ICD-10-CM | POA: Diagnosis not present

## 2015-06-07 DIAGNOSIS — S301XXD Contusion of abdominal wall, subsequent encounter: Secondary | ICD-10-CM | POA: Diagnosis not present

## 2015-06-07 DIAGNOSIS — Z992 Dependence on renal dialysis: Secondary | ICD-10-CM | POA: Diagnosis not present

## 2015-06-07 DIAGNOSIS — A419 Sepsis, unspecified organism: Secondary | ICD-10-CM | POA: Diagnosis not present

## 2015-06-07 DIAGNOSIS — N186 End stage renal disease: Secondary | ICD-10-CM | POA: Diagnosis not present

## 2015-06-07 DIAGNOSIS — J189 Pneumonia, unspecified organism: Secondary | ICD-10-CM | POA: Diagnosis not present

## 2015-06-08 DIAGNOSIS — Z298 Encounter for other specified prophylactic measures: Secondary | ICD-10-CM | POA: Diagnosis not present

## 2015-06-08 DIAGNOSIS — D509 Iron deficiency anemia, unspecified: Secondary | ICD-10-CM | POA: Diagnosis not present

## 2015-06-08 DIAGNOSIS — T814XXA Infection following a procedure, initial encounter: Secondary | ICD-10-CM | POA: Diagnosis not present

## 2015-06-08 DIAGNOSIS — Z23 Encounter for immunization: Secondary | ICD-10-CM | POA: Diagnosis not present

## 2015-06-08 DIAGNOSIS — R7881 Bacteremia: Secondary | ICD-10-CM | POA: Diagnosis not present

## 2015-06-08 DIAGNOSIS — N186 End stage renal disease: Secondary | ICD-10-CM | POA: Diagnosis not present

## 2015-06-10 LAB — PH, BODY FLUID: PH, BODY FLUID: 8.1

## 2015-06-11 DIAGNOSIS — Z23 Encounter for immunization: Secondary | ICD-10-CM | POA: Diagnosis not present

## 2015-06-11 DIAGNOSIS — D509 Iron deficiency anemia, unspecified: Secondary | ICD-10-CM | POA: Diagnosis not present

## 2015-06-11 DIAGNOSIS — N186 End stage renal disease: Secondary | ICD-10-CM | POA: Diagnosis not present

## 2015-06-11 DIAGNOSIS — T814XXA Infection following a procedure, initial encounter: Secondary | ICD-10-CM | POA: Diagnosis not present

## 2015-06-11 DIAGNOSIS — R7881 Bacteremia: Secondary | ICD-10-CM | POA: Diagnosis not present

## 2015-06-11 DIAGNOSIS — Z298 Encounter for other specified prophylactic measures: Secondary | ICD-10-CM | POA: Diagnosis not present

## 2015-06-13 DIAGNOSIS — Z23 Encounter for immunization: Secondary | ICD-10-CM | POA: Diagnosis not present

## 2015-06-13 DIAGNOSIS — R7881 Bacteremia: Secondary | ICD-10-CM | POA: Diagnosis not present

## 2015-06-13 DIAGNOSIS — D509 Iron deficiency anemia, unspecified: Secondary | ICD-10-CM | POA: Diagnosis not present

## 2015-06-13 DIAGNOSIS — T814XXA Infection following a procedure, initial encounter: Secondary | ICD-10-CM | POA: Diagnosis not present

## 2015-06-13 DIAGNOSIS — N186 End stage renal disease: Secondary | ICD-10-CM | POA: Diagnosis not present

## 2015-06-13 DIAGNOSIS — Z298 Encounter for other specified prophylactic measures: Secondary | ICD-10-CM | POA: Diagnosis not present

## 2015-06-15 ENCOUNTER — Emergency Department: Payer: Medicare Other

## 2015-06-15 ENCOUNTER — Encounter: Payer: Self-pay | Admitting: Emergency Medicine

## 2015-06-15 ENCOUNTER — Inpatient Hospital Stay
Admission: EM | Admit: 2015-06-15 | Discharge: 2015-06-24 | DRG: 682 | Disposition: A | Discharge status: Skilled Nursing Facility | Payer: Medicare Other | Attending: Internal Medicine | Admitting: Internal Medicine

## 2015-06-15 DIAGNOSIS — E785 Hyperlipidemia, unspecified: Secondary | ICD-10-CM | POA: Diagnosis present

## 2015-06-15 DIAGNOSIS — Z8679 Personal history of other diseases of the circulatory system: Secondary | ICD-10-CM

## 2015-06-15 DIAGNOSIS — Q2733 Arteriovenous malformation of digestive system vessel: Secondary | ICD-10-CM | POA: Diagnosis not present

## 2015-06-15 DIAGNOSIS — E876 Hypokalemia: Secondary | ICD-10-CM | POA: Diagnosis not present

## 2015-06-15 DIAGNOSIS — T827XXA Infection and inflammatory reaction due to other cardiac and vascular devices, implants and grafts, initial encounter: Secondary | ICD-10-CM | POA: Diagnosis not present

## 2015-06-15 DIAGNOSIS — J449 Chronic obstructive pulmonary disease, unspecified: Secondary | ICD-10-CM | POA: Diagnosis present

## 2015-06-15 DIAGNOSIS — Y841 Kidney dialysis as the cause of abnormal reaction of the patient, or of later complication, without mention of misadventure at the time of the procedure: Secondary | ICD-10-CM | POA: Diagnosis not present

## 2015-06-15 DIAGNOSIS — T814XXA Infection following a procedure, initial encounter: Secondary | ICD-10-CM | POA: Diagnosis not present

## 2015-06-15 DIAGNOSIS — K449 Diaphragmatic hernia without obstruction or gangrene: Secondary | ICD-10-CM | POA: Diagnosis not present

## 2015-06-15 DIAGNOSIS — R262 Difficulty in walking, not elsewhere classified: Secondary | ICD-10-CM | POA: Diagnosis not present

## 2015-06-15 DIAGNOSIS — N2581 Secondary hyperparathyroidism of renal origin: Secondary | ICD-10-CM | POA: Diagnosis not present

## 2015-06-15 DIAGNOSIS — D631 Anemia in chronic kidney disease: Secondary | ICD-10-CM | POA: Diagnosis present

## 2015-06-15 DIAGNOSIS — M6281 Muscle weakness (generalized): Secondary | ICD-10-CM | POA: Diagnosis not present

## 2015-06-15 DIAGNOSIS — K921 Melena: Secondary | ICD-10-CM | POA: Diagnosis not present

## 2015-06-15 DIAGNOSIS — I2510 Atherosclerotic heart disease of native coronary artery without angina pectoris: Secondary | ICD-10-CM | POA: Diagnosis present

## 2015-06-15 DIAGNOSIS — D500 Iron deficiency anemia secondary to blood loss (chronic): Secondary | ICD-10-CM | POA: Diagnosis not present

## 2015-06-15 DIAGNOSIS — N186 End stage renal disease: Secondary | ICD-10-CM | POA: Diagnosis present

## 2015-06-15 DIAGNOSIS — R0602 Shortness of breath: Secondary | ICD-10-CM

## 2015-06-15 DIAGNOSIS — I5030 Unspecified diastolic (congestive) heart failure: Secondary | ICD-10-CM | POA: Diagnosis not present

## 2015-06-15 DIAGNOSIS — L7634 Postprocedural seroma of skin and subcutaneous tissue following other procedure: Secondary | ICD-10-CM | POA: Diagnosis not present

## 2015-06-15 DIAGNOSIS — R1310 Dysphagia, unspecified: Secondary | ICD-10-CM | POA: Diagnosis not present

## 2015-06-15 DIAGNOSIS — F419 Anxiety disorder, unspecified: Secondary | ICD-10-CM | POA: Diagnosis not present

## 2015-06-15 DIAGNOSIS — Z298 Encounter for other specified prophylactic measures: Secondary | ICD-10-CM | POA: Diagnosis not present

## 2015-06-15 DIAGNOSIS — R41841 Cognitive communication deficit: Secondary | ICD-10-CM | POA: Diagnosis not present

## 2015-06-15 DIAGNOSIS — I120 Hypertensive chronic kidney disease with stage 5 chronic kidney disease or end stage renal disease: Principal | ICD-10-CM | POA: Diagnosis present

## 2015-06-15 DIAGNOSIS — E039 Hypothyroidism, unspecified: Secondary | ICD-10-CM | POA: Diagnosis not present

## 2015-06-15 DIAGNOSIS — A419 Sepsis, unspecified organism: Secondary | ICD-10-CM | POA: Diagnosis not present

## 2015-06-15 DIAGNOSIS — I959 Hypotension, unspecified: Secondary | ICD-10-CM | POA: Diagnosis not present

## 2015-06-15 DIAGNOSIS — K31819 Angiodysplasia of stomach and duodenum without bleeding: Secondary | ICD-10-CM | POA: Diagnosis not present

## 2015-06-15 DIAGNOSIS — R6520 Severe sepsis without septic shock: Secondary | ICD-10-CM | POA: Diagnosis not present

## 2015-06-15 DIAGNOSIS — D696 Thrombocytopenia, unspecified: Secondary | ICD-10-CM | POA: Diagnosis not present

## 2015-06-15 DIAGNOSIS — I953 Hypotension of hemodialysis: Secondary | ICD-10-CM | POA: Diagnosis present

## 2015-06-15 DIAGNOSIS — D649 Anemia, unspecified: Secondary | ICD-10-CM | POA: Diagnosis not present

## 2015-06-15 DIAGNOSIS — T888XXA Other specified complications of surgical and medical care, not elsewhere classified, initial encounter: Secondary | ICD-10-CM | POA: Diagnosis not present

## 2015-06-15 DIAGNOSIS — R079 Chest pain, unspecified: Secondary | ICD-10-CM | POA: Diagnosis not present

## 2015-06-15 DIAGNOSIS — I214 Non-ST elevation (NSTEMI) myocardial infarction: Secondary | ICD-10-CM | POA: Diagnosis not present

## 2015-06-15 DIAGNOSIS — J90 Pleural effusion, not elsewhere classified: Secondary | ICD-10-CM | POA: Diagnosis not present

## 2015-06-15 DIAGNOSIS — Z992 Dependence on renal dialysis: Secondary | ICD-10-CM

## 2015-06-15 DIAGNOSIS — J189 Pneumonia, unspecified organism: Secondary | ICD-10-CM | POA: Diagnosis not present

## 2015-06-15 DIAGNOSIS — D509 Iron deficiency anemia, unspecified: Secondary | ICD-10-CM | POA: Diagnosis not present

## 2015-06-15 DIAGNOSIS — A4152 Sepsis due to Pseudomonas: Secondary | ICD-10-CM | POA: Diagnosis not present

## 2015-06-15 DIAGNOSIS — D72829 Elevated white blood cell count, unspecified: Secondary | ICD-10-CM | POA: Diagnosis not present

## 2015-06-15 DIAGNOSIS — H8110 Benign paroxysmal vertigo, unspecified ear: Secondary | ICD-10-CM | POA: Diagnosis not present

## 2015-06-15 DIAGNOSIS — D638 Anemia in other chronic diseases classified elsewhere: Secondary | ICD-10-CM | POA: Diagnosis not present

## 2015-06-15 DIAGNOSIS — I132 Hypertensive heart and chronic kidney disease with heart failure and with stage 5 chronic kidney disease, or end stage renal disease: Secondary | ICD-10-CM | POA: Diagnosis not present

## 2015-06-15 DIAGNOSIS — D62 Acute posthemorrhagic anemia: Secondary | ICD-10-CM | POA: Diagnosis not present

## 2015-06-15 DIAGNOSIS — I714 Abdominal aortic aneurysm, without rupture: Secondary | ICD-10-CM | POA: Diagnosis not present

## 2015-06-15 DIAGNOSIS — Z23 Encounter for immunization: Secondary | ICD-10-CM | POA: Diagnosis not present

## 2015-06-15 DIAGNOSIS — K219 Gastro-esophageal reflux disease without esophagitis: Secondary | ICD-10-CM | POA: Diagnosis present

## 2015-06-15 DIAGNOSIS — I10 Essential (primary) hypertension: Secondary | ICD-10-CM | POA: Diagnosis not present

## 2015-06-15 DIAGNOSIS — Z1622 Resistance to vancomycin related antibiotics: Secondary | ICD-10-CM | POA: Diagnosis not present

## 2015-06-15 DIAGNOSIS — I5032 Chronic diastolic (congestive) heart failure: Secondary | ICD-10-CM | POA: Diagnosis present

## 2015-06-15 DIAGNOSIS — I890 Lymphedema, not elsewhere classified: Secondary | ICD-10-CM | POA: Diagnosis not present

## 2015-06-15 DIAGNOSIS — D7582 Heparin induced thrombocytopenia (HIT): Secondary | ICD-10-CM | POA: Diagnosis present

## 2015-06-15 DIAGNOSIS — Z8701 Personal history of pneumonia (recurrent): Secondary | ICD-10-CM | POA: Diagnosis not present

## 2015-06-15 DIAGNOSIS — R7881 Bacteremia: Secondary | ICD-10-CM | POA: Diagnosis not present

## 2015-06-15 DIAGNOSIS — J810 Acute pulmonary edema: Secondary | ICD-10-CM | POA: Diagnosis not present

## 2015-06-15 DIAGNOSIS — K922 Gastrointestinal hemorrhage, unspecified: Secondary | ICD-10-CM | POA: Diagnosis not present

## 2015-06-15 DIAGNOSIS — A498 Other bacterial infections of unspecified site: Secondary | ICD-10-CM | POA: Diagnosis not present

## 2015-06-15 DIAGNOSIS — E861 Hypovolemia: Secondary | ICD-10-CM | POA: Diagnosis present

## 2015-06-15 DIAGNOSIS — N185 Chronic kidney disease, stage 5: Secondary | ICD-10-CM | POA: Diagnosis not present

## 2015-06-15 DIAGNOSIS — E7800 Pure hypercholesterolemia, unspecified: Secondary | ICD-10-CM | POA: Diagnosis not present

## 2015-06-15 HISTORY — DX: End stage renal disease: N18.6

## 2015-06-15 HISTORY — DX: Unspecified diastolic (congestive) heart failure: I50.30

## 2015-06-15 LAB — CBC
HCT: 20 % — ABNORMAL LOW (ref 35.0–47.0)
HEMOGLOBIN: 6.8 g/dL — AB (ref 12.0–16.0)
MCH: 31.3 pg (ref 26.0–34.0)
MCHC: 34.2 g/dL (ref 32.0–36.0)
MCV: 91.6 fL (ref 80.0–100.0)
PLATELETS: 123 10*3/uL — AB (ref 150–440)
RBC: 2.18 MIL/uL — AB (ref 3.80–5.20)
RDW: 18.1 % — ABNORMAL HIGH (ref 11.5–14.5)
WBC: 4.9 10*3/uL (ref 3.6–11.0)

## 2015-06-15 LAB — COMPREHENSIVE METABOLIC PANEL
ALBUMIN: 2.3 g/dL — AB (ref 3.5–5.0)
ALK PHOS: 78 U/L (ref 38–126)
ALT: 14 U/L (ref 14–54)
AST: 31 U/L (ref 15–41)
Anion gap: 7 (ref 5–15)
BUN: 25 mg/dL — ABNORMAL HIGH (ref 6–20)
CHLORIDE: 98 mmol/L — AB (ref 101–111)
CO2: 30 mmol/L (ref 22–32)
CREATININE: 3.06 mg/dL — AB (ref 0.44–1.00)
Calcium: 8 mg/dL — ABNORMAL LOW (ref 8.9–10.3)
GFR calc non Af Amer: 15 mL/min — ABNORMAL LOW (ref >60)
GFR, EST AFRICAN AMERICAN: 17 mL/min — AB (ref >60)
GLUCOSE: 90 mg/dL (ref 65–99)
Potassium: 3 mmol/L — ABNORMAL LOW (ref 3.5–5.1)
SODIUM: 135 mmol/L (ref 135–145)
Total Bilirubin: 0.6 mg/dL (ref 0.3–1.2)
Total Protein: 5.7 g/dL — ABNORMAL LOW (ref 6.5–8.1)

## 2015-06-15 LAB — TROPONIN I: Troponin I: <0.03 ng/mL (ref <0.031)

## 2015-06-15 LAB — PREPARE RBC (CROSSMATCH)

## 2015-06-15 LAB — IRON AND TIBC
Iron: 161 ug/dL (ref 28–170)
SATURATION RATIOS: 86 % — AB (ref 10.4–31.8)
TIBC: 188 ug/dL — AB (ref 250–450)
UIBC: 27 ug/dL

## 2015-06-15 LAB — RETICULOCYTES
RBC.: 2.09 MIL/uL — ABNORMAL LOW (ref 3.80–5.20)
RETIC CT PCT: 0.7 % (ref 0.4–3.1)
Retic Count, Absolute: 14.6 10*3/uL — ABNORMAL LOW (ref 19.0–183.0)

## 2015-06-15 LAB — FERRITIN: Ferritin: 335 ng/mL — ABNORMAL HIGH (ref 11–307)

## 2015-06-15 LAB — FOLATE: FOLATE: 14.2 ng/mL (ref >5.9)

## 2015-06-15 MED ORDER — LOPERAMIDE HCL 2 MG PO CAPS: 2.0000 mg | ORAL_CAPSULE | ORAL | Status: DC | PRN

## 2015-06-15 MED ORDER — CEFTRIAXONE SODIUM-DEXTROSE 1-3.74 GM-% IV SOLR
1.0000 g | INTRAVENOUS | Status: DC
Start: 2015-06-15 — End: 2015-06-16
  Administered 2015-06-15: 1 g via INTRAVENOUS
  Filled 2015-06-15 (×2): qty 50

## 2015-06-15 MED ORDER — SODIUM CHLORIDE 0.9% FLUSH: 3.0000 mL | INTRAVENOUS | Status: DC | PRN

## 2015-06-15 MED ORDER — ONDANSETRON HCL 4 MG/2ML IJ SOLN: 4.0000 mg | Freq: Four times a day (QID) | INTRAMUSCULAR | Status: DC | PRN

## 2015-06-15 MED ORDER — ALBUTEROL SULFATE (2.5 MG/3ML) 0.083% IN NEBU
2.5000 mg | INHALATION_SOLUTION | Freq: Four times a day (QID) | RESPIRATORY_TRACT | Status: DC | PRN
  Administered 2015-06-22: 2.5 mg via RESPIRATORY_TRACT
  Filled 2015-06-15: qty 3

## 2015-06-15 MED ORDER — SENNOSIDES-DOCUSATE SODIUM 8.6-50 MG PO TABS: 1.0000 | ORAL_TABLET | Freq: Every evening | ORAL | Status: DC | PRN

## 2015-06-15 MED ORDER — ACETAMINOPHEN 325 MG PO TABS
650.0000 mg | ORAL_TABLET | Freq: Four times a day (QID) | ORAL | Status: DC | PRN
  Administered 2015-06-23 (×2): 650 mg via ORAL
  Filled 2015-06-15 (×3): qty 2

## 2015-06-15 MED ORDER — ALUM & MAG HYDROXIDE-SIMETH 200-200-20 MG/5ML PO SUSP
30.0000 mL | Freq: Four times a day (QID) | ORAL | Status: DC | PRN
  Administered 2015-06-15 – 2015-06-23 (×7): 30 mL via ORAL
  Filled 2015-06-15 (×6): qty 30

## 2015-06-15 MED ORDER — DOCUSATE SODIUM 100 MG PO CAPS
100.0000 mg | ORAL_CAPSULE | Freq: Two times a day (BID) | ORAL | Status: DC
  Filled 2015-06-15 (×11): qty 1

## 2015-06-15 MED ORDER — HYDROCODONE-ACETAMINOPHEN 5-325 MG PO TABS: 1.0000 | ORAL_TABLET | ORAL | Status: DC | PRN

## 2015-06-15 MED ORDER — SODIUM CHLORIDE 0.9 % IV SOLN: 250.0000 mL | INTRAVENOUS | Status: DC | PRN

## 2015-06-15 MED ORDER — AMLODIPINE BESYLATE 5 MG PO TABS
5.0000 mg | ORAL_TABLET | Freq: Every day | ORAL | Status: DC
Start: 2015-06-15 — End: 2015-06-16
  Filled 2015-06-15 (×2): qty 1

## 2015-06-15 MED ORDER — RENA-VITE PO TABS
1.0000 | ORAL_TABLET | Freq: Every day | ORAL | Status: DC
  Administered 2015-06-16 – 2015-06-24 (×7): 1 via ORAL
  Filled 2015-06-15 (×8): qty 1

## 2015-06-15 MED ORDER — METOPROLOL TARTRATE 50 MG PO TABS: 50.0000 mg | ORAL_TABLET | Freq: Three times a day (TID) | ORAL | Status: DC

## 2015-06-15 MED ORDER — ONDANSETRON HCL 4 MG PO TABS: 4.0000 mg | ORAL_TABLET | Freq: Four times a day (QID) | ORAL | Status: DC | PRN

## 2015-06-15 MED ORDER — ASPIRIN EC 81 MG PO TBEC
81.0000 mg | DELAYED_RELEASE_TABLET | Freq: Every day | ORAL | Status: DC
  Filled 2015-06-15: qty 1

## 2015-06-15 MED ORDER — SALINE SPRAY 0.65 % NA SOLN
1.0000 | Freq: Every day | NASAL | Status: DC
  Administered 2015-06-15 – 2015-06-24 (×35): 1 via NASAL
  Filled 2015-06-15: qty 44

## 2015-06-15 MED ORDER — MOMETASONE FURO-FORMOTEROL FUM 100-5 MCG/ACT IN AERO: 2.0000 | INHALATION_SPRAY | Freq: Two times a day (BID) | RESPIRATORY_TRACT | Status: DC

## 2015-06-15 MED ORDER — HYDRALAZINE HCL 10 MG PO TABS
10.0000 mg | ORAL_TABLET | Freq: Four times a day (QID) | ORAL | Status: DC
  Administered 2015-06-16 – 2015-06-17 (×5): 10 mg via ORAL
  Filled 2015-06-15 (×11): qty 1

## 2015-06-15 MED ORDER — HYDROCODONE-ACETAMINOPHEN 5-325 MG PO TABS
1.0000 | ORAL_TABLET | ORAL | Status: DC | PRN
  Administered 2015-06-16 – 2015-06-21 (×6): 1 via ORAL
  Filled 2015-06-15 (×6): qty 1

## 2015-06-15 MED ORDER — LEVOTHYROXINE SODIUM 88 MCG PO TABS
88.0000 ug | ORAL_TABLET | Freq: Every day | ORAL | Status: DC
  Administered 2015-06-16 – 2015-06-19 (×3): 88 ug via ORAL
  Filled 2015-06-15 (×3): qty 1

## 2015-06-15 MED ORDER — ACETAMINOPHEN 650 MG RE SUPP: 650.0000 mg | Freq: Four times a day (QID) | RECTAL | Status: DC | PRN

## 2015-06-15 MED ORDER — INFLUENZA VAC SPLIT QUAD 0.5 ML IM SUSY: 0.5000 mL | PREFILLED_SYRINGE | INTRAMUSCULAR | Status: DC

## 2015-06-15 MED ORDER — ALPRAZOLAM 0.25 MG PO TABS
0.2500 mg | ORAL_TABLET | Freq: Two times a day (BID) | ORAL | Status: DC | PRN
  Administered 2015-06-21 – 2015-06-23 (×4): 0.25 mg via ORAL
  Filled 2015-06-15 (×4): qty 1

## 2015-06-15 MED ORDER — PROMETHAZINE HCL 25 MG/ML IJ SOLN
6.2500 mg | Freq: Four times a day (QID) | INTRAMUSCULAR | Status: DC | PRN
Start: 2015-06-15 — End: 2015-06-24

## 2015-06-15 MED ORDER — MAGNESIUM HYDROXIDE 400 MG/5ML PO SUSP: 30.0000 mL | Freq: Every day | ORAL | Status: DC | PRN

## 2015-06-15 MED ORDER — FLUTICASONE FUROATE-VILANTEROL 100-25 MCG/INH IN AEPB
1.0000 | INHALATION_SPRAY | Freq: Every day | RESPIRATORY_TRACT | Status: DC
  Administered 2015-06-16 – 2015-06-24 (×7): 1 via RESPIRATORY_TRACT
  Filled 2015-06-15: qty 28

## 2015-06-15 MED ORDER — FLUTICASONE PROPIONATE 50 MCG/ACT NA SUSP
2.0000 | Freq: Every day | NASAL | Status: DC
Start: 2015-06-16 — End: 2015-06-24
  Administered 2015-06-16 – 2015-06-24 (×7): 2 via NASAL
  Filled 2015-06-15 (×2): qty 16

## 2015-06-15 MED ORDER — ACETAMINOPHEN 500 MG PO TABS
1000.0000 mg | ORAL_TABLET | Freq: Three times a day (TID) | ORAL | Status: DC
  Administered 2015-06-15 – 2015-06-18 (×9): 1000 mg via ORAL
  Filled 2015-06-15 (×10): qty 2

## 2015-06-15 MED ORDER — SODIUM CHLORIDE 0.9 % IV SOLN
Freq: Once | INTRAVENOUS | Status: AC
  Administered 2015-06-19: 12:00:00 via INTRAVENOUS

## 2015-06-15 MED ORDER — SODIUM CHLORIDE 0.9% FLUSH
3.0000 mL | Freq: Two times a day (BID) | INTRAVENOUS | Status: DC
  Administered 2015-06-15 – 2015-06-23 (×16): 3 mL via INTRAVENOUS

## 2015-06-15 MED ORDER — FAMOTIDINE 20 MG PO TABS
20.0000 mg | ORAL_TABLET | Freq: Every day | ORAL | Status: DC
  Administered 2015-06-15 – 2015-06-24 (×8): 20 mg via ORAL
  Filled 2015-06-15 (×9): qty 1

## 2015-06-15 MED ORDER — ATORVASTATIN CALCIUM 20 MG PO TABS
40.0000 mg | ORAL_TABLET | Freq: Every day | ORAL | Status: DC
  Administered 2015-06-16 – 2015-06-17 (×3): 40 mg via ORAL
  Filled 2015-06-15 (×4): qty 2

## 2015-06-15 MED ORDER — IRBESARTAN 75 MG PO TABS
75.0000 mg | ORAL_TABLET | Freq: Two times a day (BID) | ORAL | Status: DC
  Administered 2015-06-15: 75 mg via ORAL
  Filled 2015-06-15: qty 1

## 2015-06-15 MED ORDER — DEXTROSE 5 % IV SOLN
1.0000 g | INTRAVENOUS | Status: DC
  Administered 2015-06-18 – 2015-06-22 (×3): 1 g via INTRAVENOUS
  Filled 2015-06-15 (×4): qty 1

## 2015-06-15 NOTE — ED Notes (Signed)
Pt was receiving dialysis and had approx an hour of treatment when her bp dropped, pt denies loc, states that she started to feel tight in her chest with increased sob. Pt denies s/s at this time, pt had AAA repair in Nov '16, pt is in rehab at [facility] to gain strength to walk. No distress noted at this time
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
## 2015-06-15 NOTE — Progress Notes (Signed)
Pt. With asymptomatic anemia, BP 140/52, scheduled BP meds held this shift per MD.
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
## 2015-06-15 NOTE — ED Notes (Signed)
Dressing on sacrum changed

## 2015-06-16 LAB — BASIC METABOLIC PANEL
ANION GAP: 10 (ref 5–15)
BUN: 35 mg/dL — AB (ref 6–20)
CALCIUM: 8.2 mg/dL — AB (ref 8.9–10.3)
CO2: 25 mmol/L (ref 22–32)
Chloride: 101 mmol/L (ref 101–111)
Creatinine, Ser: 3.69 mg/dL — ABNORMAL HIGH (ref 0.44–1.00)
GFR calc Af Amer: 14 mL/min — ABNORMAL LOW (ref >60)
GFR calc non Af Amer: 12 mL/min — ABNORMAL LOW (ref >60)
GLUCOSE: 71 mg/dL (ref 65–99)
Potassium: 3.1 mmol/L — ABNORMAL LOW (ref 3.5–5.1)
Sodium: 136 mmol/L (ref 135–145)

## 2015-06-16 LAB — VITAMIN B12: VITAMIN B 12: 290 pg/mL (ref 180–914)

## 2015-06-16 LAB — CBC
HEMATOCRIT: 18.9 % — AB (ref 35.0–47.0)
HEMOGLOBIN: 6.5 g/dL — AB (ref 12.0–16.0)
MCH: 31.4 pg (ref 26.0–34.0)
MCHC: 34.5 g/dL (ref 32.0–36.0)
MCV: 91 fL (ref 80.0–100.0)
Platelets: 78 10*3/uL — ABNORMAL LOW (ref 150–440)
RBC: 2.07 MIL/uL — ABNORMAL LOW (ref 3.80–5.20)
RDW: 17.5 % — AB (ref 11.5–14.5)
WBC: 4.2 10*3/uL (ref 3.6–11.0)

## 2015-06-16 LAB — PREPARE RBC (CROSSMATCH)

## 2015-06-16 LAB — HEMOGLOBIN AND HEMATOCRIT, BLOOD
HCT: 26.2 % — ABNORMAL LOW (ref 35.0–47.0)
Hemoglobin: 8.9 g/dL — ABNORMAL LOW (ref 12.0–16.0)

## 2015-06-16 MED ORDER — SODIUM CHLORIDE 0.9 % IV SOLN
Freq: Once | INTRAVENOUS | Status: AC
  Administered 2015-06-16: 12:00:00 via INTRAVENOUS

## 2015-06-16 MED ORDER — METOPROLOL TARTRATE 25 MG PO TABS
25.0000 mg | ORAL_TABLET | Freq: Three times a day (TID) | ORAL | Status: DC
  Administered 2015-06-16 – 2015-06-24 (×21): 25 mg via ORAL
  Filled 2015-06-16 (×21): qty 1

## 2015-06-16 NOTE — Progress Notes (Signed)
[facility] [place] NAME: [name]    MR#:  [id number]  DATE OF BIRTH:  March 01, 1951  SUBJECTIVE:  CHIEF COMPLAINT:   Chief Complaint  Patient presents with  . Hypotension  . Chest Pain   - admitted with hypotension during dialysis - asymptomatic now, feels extremely weak. Low hb inspite of 1 unit transfusion yesterday. - 1 more unit ordered today. From [facility]  REVIEW OF SYSTEMS:  Review of Systems  Constitutional: Positive for malaise/fatigue. Negative for fever and chills.  HENT: Negative for ear discharge, ear pain and nosebleeds.   Eyes: Negative for blurred vision and double vision.  Respiratory: Negative for cough, shortness of breath and wheezing.   Cardiovascular: Negative for chest pain, palpitations and leg swelling.  Gastrointestinal: Negative for nausea, vomiting, abdominal pain, diarrhea and constipation.  Genitourinary: Negative for dysuria and urgency.  Musculoskeletal: Positive for back pain. Negative for myalgias.  Neurological: Negative for dizziness, sensory change, speech change, focal weakness, seizures and headaches.  Psychiatric/Behavioral: Negative for depression.    DRUG ALLERGIES:   Allergies  Allergen Reactions  . Asa [Aspirin] Other (See Comments)    unknown  . 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 Plavix [Clopidogrel] Other (See Comments)    unknown    VITALS:  Blood pressure 150/57, pulse 81, temperature 98.5 F (36.9 C), temperature source Oral, resp. rate 16, height 5\' 4"  (1.626 m), weight 55.339 kg (122 lb), last menstrual period 03/30/1988, SpO2 99 %.  PHYSICAL EXAMINATION:  Physical Exam  GENERAL:  65 y.o.-year-old patient lying in the bed with no acute distress. Pale appearing EYES: Pupils equal, round, reactive to light and accommodation. No scleral icterus. Extraocular muscles intact.  HEENT: Head atraumatic, normocephalic.  Oropharynx and nasopharynx clear.  NECK:  Supple, no jugular venous distention. No thyroid enlargement, no tenderness.  LUNGS: Normal breath sounds bilaterally, no wheezing, rales,rhonchi or crepitation. No use of accessory muscles of respiration. Decreased bibasilar breath sounds CARDIOVASCULAR: S1, S2 normal. No murmurs, rubs, or gallops.  ABDOMEN: Soft, nontender, nondistended. Bowel sounds present. No organomegaly or mass.  EXTREMITIES: No pedal edema, cyanosis, or clubbing. Right groin wound vac in place, no evidence of infection, erythema otherwise. NEUROLOGIC: Cranial nerves II through XII are intact. Muscle strength 5/5 in all extremities. Sensation intact. Gait not checked.  PSYCHIATRIC: The patient is alert and oriented x 3.  SKIN: No obvious rash, lesion, or ulcer.    LABORATORY PANEL:   CBC  Recent Labs Lab 06/16/15 0544  WBC 4.2  HGB 6.5*  HCT 18.9*  PLT 78*   ------------------------------------------------------------------------------------------------------------------  Chemistries   Recent Labs Lab 06/15/15 1428 06/16/15 0544  NA 135 136  K 3.0* 3.1*  CL 98* 101  CO2 30 25  GLUCOSE 90 71  BUN 25* 35*  CREATININE 3.06* 3.69*  CALCIUM 8.0* 8.2*  AST 31  --   ALT 14  --   ALKPHOS 78  --   BILITOT 0.6  --    ------------------------------------------------------------------------------------------------------------------  Cardiac Enzymes  Recent Labs Lab 06/15/15 1428  TROPONINI <0.03   ------------------------------------------------------------------------------------------------------------------  RADIOLOGY:  Dg Chest 2 View  06/15/2015  CLINICAL DATA:  Dyspnea and hypertension during dialysis EXAM: CHEST  2 VIEW COMPARISON:  05/31/2015 FINDINGS: Right dialysis catheter is again seen and stable. The cardiac shadow is within normal limits. Small bilateral pleural effusions are again noted. No focal infiltrate is noted. Mild atelectasis is  likely present adjacent to the effusions. Aortic stent graft is noted in the abdomen. IMPRESSION: Small bilateral pleural effusions with likely adjacent atelectasis. Electronically Signed   By: Inez Catalina M.D.   On: 06/15/2015 16:32    EKG:   Orders placed or performed during the hospital encounter of 06/15/15  . ED EKG  . ED EKG  . EKG 12-Lead  . EKG 12-Lead    ASSESSMENT AND PLAN:   Extensive 65-year-old female with past medical history significant for ESRD on Tuesday Thursday Saturday hemodialysis, recent pseudomonal right groin seroma infection status post wound VAC placement, hypertension and history of pneumonia and diastolic CHF exacerbation presents to the hospital from [place] secondary to hypotension during dialysis  #1 hypotension- likely hypovolemic and also from anemia - Improved. No IV fluids as might go into CHF  - monitor, adjust antihypertensives now  #2 Acute on chronic anemia- anemia of chronic disease. Recent adm requiring transfusions as well - 1 unit yesterday with no improvement in hb - 1 more unit today, recheck tomorrow  #3 right groin infected seroma with Pseudomonas-continue Fortaz with dialysis  #4 end-stage renal disease with hemodialysis-on Tuesday Thursday Saturday schedule. Nephrology has been consulted. Dialysis yesterday was not finished due to hypotension. Further management per nephrology team.  #5 hypertension-Norvasc, Avapro and hydralazine have been discontinued. Continue low-dose metoprolol for now.  #6 DVT prophylaxis-Ted's and SCDs. No heparin products due to anemia.   All the records are reviewed and case discussed with Care Management/Social Workerr. Management plans discussed with the patient, family and they are in agreement.  CODE STATUS: Full Code  TOTAL TIME TAKING CARE OF THIS PATIENT: 38 minutes.   POSSIBLE D/C IN 2 DAYS, DEPENDING ON CLINICAL CONDITION.   Gladstone Lighter M.D on 06/16/2015 at 1:14  PM  Between 7am to 6pm - Pager - [phone]  After 6pm go to [email or web address] - password [email or web address] [facility]  Office  [phone]  CC: Primary care physician; Arnette Norris, MD

## 2015-06-16 NOTE — Progress Notes (Signed)
[facility]  ROUNDING NOTE   Subjective:  Pt well known to [place]. Shes developed hypotension during dialysis. Also came in with anemia. Was transfused yesterday, and being transfused another unit of blood today. Platelets low at 78k as well.   Objective:  Vital signs in last 24 hours:  Temp:  [97.7 F (36.5 C)-98.5 F (36.9 C)] 98.2 F (36.8 C) (03/19 1606) Pulse Rate:  [75-88] 82 (03/19 1606) Resp:  [6-20] 16 (03/19 0736) BP: (132-150)/(52-63) 146/63 mmHg (03/19 1606) SpO2:  [98 %-100 %] 98 % (03/19 1606)  Weight change:  Filed Weights   06/15/15 1433  Weight: 55.339 kg (122 lb)    Intake/Output: I/O last 3 completed shifts: In: 385 [P.O.:120; Blood:265] Out: 0    Intake/Output this shift:  Total I/O In: 273.1 [Blood:273.1] Out: 0   Physical Exam: General: NAD, resting in bed  Head: Normocephalic, atraumatic. Moist oral mucosal membranes  Eyes: Anicteric  Neck: Supple, trachea midline  Lungs:  Clear to auscultation normal effort  Heart: S1S2 no rubs  Abdomen:  Soft, nontender, BS present  Extremities: Trace LEedema.  Neurologic: Nonfocal, moving all four extremities  Skin: No lesions  Access: R IJ PC    Basic Metabolic Panel:  Recent Labs Lab 06/15/15 1428 06/16/15 0544  NA 135 136  K 3.0* 3.1*  CL 98* 101  CO2 30 25  GLUCOSE 90 71  BUN 25* 35*  CREATININE 3.06* 3.69*  CALCIUM 8.0* 8.2*    Liver Function Tests:  Recent Labs Lab 06/15/15 1428  AST 31  ALT 14  ALKPHOS 78  BILITOT 0.6  PROT 5.7*  ALBUMIN 2.3*   No results for input(s): LIPASE, AMYLASE in the last 168 hours. No results for input(s): AMMONIA in the last 168 hours.  CBC:  Recent Labs Lab 06/15/15 1428 06/16/15 0544  WBC 4.9 4.2  HGB 6.8* 6.5*  HCT 20.0* 18.9*  MCV 91.6 91.0  PLT 123* 78*    Cardiac Enzymes:  Recent Labs Lab 06/15/15 1428  TROPONINI <0.03    BNP: Invalid input(s): POCBNP  CBG: No results for input(s): GLUCAP in the last  168 hours.  Microbiology: Results for orders placed or performed during the hospital encounter of 05/20/15  Wound culture     Status: None   Collection Time: 05/20/15 12:25 PM  Result Value Ref Range Status   Specimen Description GROIN RIGHT  Final   Special Requests NONE  Final   Gram Stain   Final    FEW WBC SEEN FEW GRAM NEGATIVE RODS RARE GRAM POSITIVE COCCI    Culture   Final    RARE PSEUDOMONAS AERUGINOSA LIGHT GROWTH VANCOMYCIN RESISTANT ENTEROCOCCUS    Report Status 05/25/2015 FINAL  Final   Organism ID, Bacteria PSEUDOMONAS AERUGINOSA  Final   Organism ID, Bacteria VANCOMYCIN RESISTANT ENTEROCOCCUS  Final      Susceptibility   Pseudomonas aeruginosa - MIC*    CEFTAZIDIME 4 SENSITIVE Sensitive     CIPROFLOXACIN 1 SENSITIVE Sensitive     GENTAMICIN <=1 SENSITIVE Sensitive     IMIPENEM 1 SENSITIVE Sensitive     CEFEPIME 4 SENSITIVE Sensitive     * RARE PSEUDOMONAS AERUGINOSA   Vancomycin resistant enterococcus - MIC*    AMPICILLIN Value in next row Resistant      RESISTANT>=32    VANCOMYCIN Value in next row Resistant      RESISTANT>=32    GENTAMICIN SYNERGY Value in next row Sensitive      RESISTANT>=32  LINEZOLID Value in next row Sensitive      SENSITIVE2    * LIGHT GROWTH VANCOMYCIN RESISTANT ENTEROCOCCUS  MRSA PCR Screening     Status: None   Collection Time: 05/20/15 12:30 PM  Result Value Ref Range Status   MRSA by PCR NEGATIVE NEGATIVE Final    Comment:        The GeneXpert MRSA Assay (FDA approved for NASAL specimens only), is one component of a comprehensive MRSA colonization surveillance program. It is not intended to diagnose MRSA infection nor to guide or monitor treatment for MRSA infections.   Anaerobic culture     Status: None   Collection Time: 05/24/15  8:31 PM  Result Value Ref Range Status   Specimen Description GROIN RIGHT  Final   Special Requests NONE  Final   Culture NO ANAEROBES ISOLATED  Final   Report Status 05/28/2015  FINAL  Final  Wound culture     Status: None   Collection Time: 05/24/15  9:13 PM  Result Value Ref Range Status   Specimen Description GROIN RIGHT  Final   Special Requests NONE  Final   Gram Stain FEW WBC SEEN MANY GRAM POSITIVE COCCI IN PAIRS   Final   Culture HEAVY GROWTH VANCOMYCIN RESISTANT ENTEROCOCCUS  Final   Report Status 05/27/2015 FINAL  Final   Organism ID, Bacteria VANCOMYCIN RESISTANT ENTEROCOCCUS  Final      Susceptibility   Vancomycin resistant enterococcus - MIC*    AMPICILLIN >=32 RESISTANT Resistant     VANCOMYCIN >=32 RESISTANT Resistant     GENTAMICIN SYNERGY SENSITIVE Sensitive     LINEZOLID 2 SENSITIVE Sensitive     * HEAVY GROWTH VANCOMYCIN RESISTANT ENTEROCOCCUS  Body fluid culture     Status: None   Collection Time: 05/31/15 10:57 AM  Result Value Ref Range Status   Specimen Description CYTOPLEU  Final   Special Requests NONE  Final   Gram Stain   Final    MODERATE RED BLOOD CELLS FEW WBC SEEN NO ORGANISMS SEEN    Culture No growth aerobically or anaerobically.  Final   Report Status 06/06/2015 FINAL  Final    Coagulation Studies: No results for input(s): LABPROT, INR in the last 72 hours.  Urinalysis: No results for input(s): COLORURINE, LABSPEC, PHURINE, GLUCOSEU, HGBUR, BILIRUBINUR, KETONESUR, PROTEINUR, UROBILINOGEN, NITRITE, LEUKOCYTESUR in the last 72 hours.  Invalid input(s): APPERANCEUR    Imaging: Dg Chest 2 View  06/15/2015  CLINICAL DATA:  Dyspnea and hypertension during dialysis EXAM: CHEST  2 VIEW COMPARISON:  05/31/2015 FINDINGS: Right dialysis catheter is again seen and stable. The cardiac shadow is within normal limits. Small bilateral pleural effusions are again noted. No focal infiltrate is noted. Mild atelectasis is likely present adjacent to the effusions. Aortic stent graft is noted in the abdomen. IMPRESSION: Small bilateral pleural effusions with likely adjacent atelectasis. Electronically Signed   By: Inez Catalina M.D.    On: 06/15/2015 16:32     Medications:     . sodium chloride   Intravenous Once  . acetaminophen  1,000 mg Oral 3 times per day  . atorvastatin  40 mg Oral Daily  . [START ON 06/18/2015] ceftAZIDime (FORTAZ) IVPB 1 gram/50 mL D5W (Pyxis)  1 g Intravenous Q T,Th,Sa-HD  . docusate sodium  100 mg Oral BID  . famotidine  20 mg Oral Daily  . fluticasone  2 spray Each Nare Daily  . fluticasone furoate-vilanterol  1 puff Inhalation Daily  . hydrALAZINE  10  mg Oral Q6H  . Influenza vac split quadrivalent PF  0.5 mL Intramuscular Tomorrow-1000  . levothyroxine  88 mcg Oral Daily  . metoprolol  25 mg Oral 3 times per day  . multivitamin  1 tablet Oral Daily  . sodium chloride  1 spray Each Nare 5 X Daily  . sodium chloride flush  3 mL Intravenous Q12H   sodium chloride, acetaminophen **OR** acetaminophen, albuterol, ALPRAZolam, alum & mag hydroxide-simeth, HYDROcodone-acetaminophen, loperamide, magnesium hydroxide, ondansetron **OR** ondansetron (ZOFRAN) IV, ondansetron, promethazine, senna-docusate, sodium chloride flush  Assessment/ Plan:  65 y.o. female with COPD, hypertension, hyperlipidemia, vaginal intraepithelial neoplasia, emergent AAA repair status post rupture on November 18, left to right femoral to femoral bypass , acute renal failure leading to permanent dialysis, significant 3 vessel disease, HIT positive, thrombocytopenia.  1.  ESRD on HD:  Has had hypotension with HD recently, may need to consider adding midodrine to help maintain BP.  Anemia and decreased circulating volume may be playing a role.  Next HD on Tuesday.  2.  Anemia of CKD/suspected blood loss:  Continue epogen with HD, pt also given 2 units, would obtain GI consultation to look for occult blood loss.   3.  SHPTH:  Check ipth/phos with HD.  4.  Thrombocytopenia:  HIT positive, pack catheter with citrate while here.    5.  Thanks for consult.    LOS: 1 Dezaray Shibuya 3/19/20175:42 PM

## 2015-06-16 NOTE — Consult Note (Signed)
I am asked by the hospitalist service Dr. Benjie Karvonen to evaluate the patient's right groin wound. She has an extremely complicated history and had a extensive vascular surgery in the form of an aortobifemoral bypass performed in [place] in the past. She had significant seroma issues and she came to our practice where drainage was performed earlier this year. At that time, cultures were negative in the OR and sartorius flaps were performed in the operating room to cover the grafts. She was discharged to rehabilitation. She has been using the VAC on the right groin and it is healing well  Right groin vac intact Feet well perfused  S/P I&D of bilateral seroma with reoperatin of the right  Now with healing  Continue VAC

## 2015-06-16 NOTE — Care Management Note (Addendum)
Case Management Note  Patient Details  Name: CARTER MEISE MRN: [id number] Date of Birth: [birth year]  Subjective/Objective:     From [facility] where she is receiving rehab. .Notified [place] [facility] dialysis CM of this admission.               Action/Plan:   Expected Discharge Date:                  Expected Discharge Plan:     In-House Referral:     Discharge planning Services     Post Acute Care Choice:    Choice offered to:     DME Arranged:    DME Agency:     HH Arranged:    [place] Agency:     Status of Service:     Medicare Important Message Given:    Date Medicare IM Given:    Medicare IM give by:    Date Additional Medicare IM Given:    Additional Medicare Important Message give by:     If discussed at [place] of Stay Meetings, dates discussed:    Additional Comments:  Ruvi Fullenwider A, RN 06/16/2015, 1:19 PM

## 2015-06-16 NOTE — Care Management Important Message (Signed)
Important Message  Patient Details  Name: [name] MRN: [id number] Date of Birth: [birth year]   Medicare Important Message Given:  Yes    Luigi Stuckey A, RN 06/16/2015, 2:27 PM

## 2015-06-17 ENCOUNTER — Other Ambulatory Visit: Payer: Medicare Other

## 2015-06-17 LAB — CBC
HCT: 21.9 % — ABNORMAL LOW (ref 35.0–47.0)
Hemoglobin: 7.6 g/dL — ABNORMAL LOW (ref 12.0–16.0)
MCH: 29.6 pg (ref 26.0–34.0)
MCHC: 34.9 g/dL (ref 32.0–36.0)
MCV: 84.7 fL (ref 80.0–100.0)
PLATELETS: 80 10*3/uL — AB (ref 150–440)
RBC: 2.59 MIL/uL — ABNORMAL LOW (ref 3.80–5.20)
RDW: 19.8 % — AB (ref 11.5–14.5)
WBC: 5.2 10*3/uL (ref 3.6–11.0)

## 2015-06-17 LAB — TYPE AND SCREEN
ABO/RH(D): B POS
Antibody Screen: NEGATIVE
UNIT DIVISION: 0
Unit division: 0

## 2015-06-17 LAB — BASIC METABOLIC PANEL
Anion gap: 10 (ref 5–15)
BUN: 53 mg/dL — ABNORMAL HIGH (ref 6–20)
CALCIUM: 8.3 mg/dL — AB (ref 8.9–10.3)
CO2: 25 mmol/L (ref 22–32)
CREATININE: 4.46 mg/dL — AB (ref 0.44–1.00)
Chloride: 98 mmol/L — ABNORMAL LOW (ref 101–111)
GFR, EST AFRICAN AMERICAN: 11 mL/min — AB (ref >60)
GFR, EST NON AFRICAN AMERICAN: 9 mL/min — AB (ref >60)
Glucose, Bld: 76 mg/dL (ref 65–99)
Potassium: 3.4 mmol/L — ABNORMAL LOW (ref 3.5–5.1)
SODIUM: 133 mmol/L — AB (ref 135–145)

## 2015-06-17 MED ORDER — HYDRALAZINE HCL 10 MG PO TABS
10.0000 mg | ORAL_TABLET | Freq: Three times a day (TID) | ORAL | Status: DC
  Administered 2015-06-17 – 2015-06-20 (×6): 10 mg via ORAL
  Filled 2015-06-17 (×6): qty 1

## 2015-06-17 MED ORDER — PANTOPRAZOLE SODIUM 40 MG IV SOLR
40.0000 mg | Freq: Two times a day (BID) | INTRAVENOUS | Status: DC
Start: 2015-06-17 — End: 2015-06-21
  Administered 2015-06-17 – 2015-06-21 (×8): 40 mg via INTRAVENOUS
  Filled 2015-06-17 (×8): qty 40

## 2015-06-17 NOTE — Clinical Social Work Note (Signed)
Clinical Social Work Assessment  Patient Details  Name: [name] MRN: [id number] Date of Birth: 01/23/1951  Date of referral:  06/17/15               Reason for consult:  Facility Placement                Permission sought to share information with:  Family Supports, Chartered certified accountant granted to share information::  Yes, Verbal Permission Granted  Name::        Agency::     Relationship::     Contact Information:     Housing/Transportation Living arrangements for the past 2 months:  [place] of Information:  Patient, Spouse Patient Interpreter Needed:  None Criminal Activity/Legal Involvement Pertinent to Current Situation/Hospitalization:  No - Comment as needed Significant Relationships:  Spouse Lives with:  Facility Resident, Spouse Do you feel safe going back to the place where you live?  Yes Need for family participation in patient care:  No (Coment)  Care giving concerns:  Patient resides at home with her husband but has been at rehab at [facility] for the past several weeks.   Social Worker assessment / plan:  CSW met with patient and her husband this afternoon and they were holding hands upon CSW entering room. Patient was pleasant and conversive and stated that she has been happy at [facility] and wishes to return when she is discharged. Patient states that she is hopeful she will not be discharged too soon from the hospital. Patient reports that she has been concerned that she cannot get through dialysis treatments before her blood pressure drops. She is also concerns as to why she is anemic.   [facility] is able to take patient back at discharge when time.  Employment status:  Retired Forensic scientist:  Medicare PT Recommendations:  Not assessed at this time Information / Referral to community resources:     Patient/Family's Response to care:  Patient and husband expressed appreciation for CSW assistance.  Patient/Family's  Understanding of and Emotional Response to Diagnosis, Current Treatment, and Prognosis:  Patient seems to be very aware of her medical situation and is hopeful that she will be able to get better soon.   Emotional Assessment Appearance:  Appears stated age Attitude/Demeanor/Rapport:   (pleasant and cooperative) Affect (typically observed):  Accepting, Adaptable, Calm Orientation:  Oriented to Self, Oriented to Place, Oriented to  Time, Oriented to Situation Alcohol / Substance use:  Not Applicable Psych involvement (Current and /or in the community):  No (Comment)  Discharge Needs  Concerns to be addressed:  Care Coordination Readmission within the last 30 days:  Yes Current discharge risk:  None Barriers to Discharge:  No Barriers Identified   Shela Leff, LCSW 06/17/2015, 1:21 PM

## 2015-06-17 NOTE — Progress Notes (Signed)
[facility]  ROUNDING NOTE   Subjective:  Patient seen at bedside this exam. Was transfused 1 unit yesterday. Hemoglobin was up to 8.9 and now currently 7.6. Dark stools noted.   Objective:  Vital signs in last 24 hours:  Temp:  [97.4 F (36.3 C)-98.5 F (36.9 C)] 97.6 F (36.4 C) (03/20 0539) Pulse Rate:  [74-88] 76 (03/20 0539) Resp:  [16-20] 20 (03/20 0539) BP: (139-154)/(55-65) 149/59 mmHg (03/20 1057) SpO2:  [98 %-100 %] 98 % (03/20 0539)  Weight change:  Filed Weights   06/15/15 1433  Weight: 55.339 kg (122 lb)    Intake/Output: I/O last 3 completed shifts: In: 778.1 [P.O.:240; Blood:538.1] Out: 0    Intake/Output this shift:     Physical Exam: General: NAD, resting in bed  Head: Normocephalic, atraumatic. Moist oral mucosal membranes  Eyes: Anicteric  Neck: Supple, trachea midline  Lungs:  Clear to auscultation normal effort  Heart: S1S2 no rubs  Abdomen:  Soft, nontender, BS present  Extremities: Trace LEedema.  Neurologic: Nonfocal, moving all four extremities  Skin: No lesions  Access: R IJ PC    Basic Metabolic Panel:  Recent Labs Lab 06/15/15 1428 06/16/15 0544 06/17/15 0531  NA 135 136 133*  K 3.0* 3.1* 3.4*  CL 98* 101 98*  CO2 30 25 25   GLUCOSE 90 71 76  BUN 25* 35* 53*  CREATININE 3.06* 3.69* 4.46*  CALCIUM 8.0* 8.2* 8.3*    Liver Function Tests:  Recent Labs Lab 06/15/15 1428  AST 31  ALT 14  ALKPHOS 78  BILITOT 0.6  PROT 5.7*  ALBUMIN 2.3*   No results for input(s): LIPASE, AMYLASE in the last 168 hours. No results for input(s): AMMONIA in the last 168 hours.  CBC:  Recent Labs Lab 06/15/15 1428 06/16/15 0544 06/16/15 1932 06/17/15 0531  WBC 4.9 4.2  --  5.2  HGB 6.8* 6.5* 8.9* 7.6*  HCT 20.0* 18.9* 26.2* 21.9*  MCV 91.6 91.0  --  84.7  PLT 123* 78*  --  80*    Cardiac Enzymes:  Recent Labs Lab 06/15/15 1428  TROPONINI <0.03    BNP: Invalid input(s): POCBNP  CBG: No results for  input(s): GLUCAP in the last 168 hours.  Microbiology: Results for orders placed or performed during the hospital encounter of 05/20/15  Wound culture     Status: None   Collection Time: 05/20/15 12:25 PM  Result Value Ref Range Status   Specimen Description GROIN RIGHT  Final   Special Requests NONE  Final   Gram Stain   Final    FEW WBC SEEN FEW GRAM NEGATIVE RODS RARE GRAM POSITIVE COCCI    Culture   Final    RARE PSEUDOMONAS AERUGINOSA LIGHT GROWTH VANCOMYCIN RESISTANT ENTEROCOCCUS    Report Status 05/25/2015 FINAL  Final   Organism ID, Bacteria PSEUDOMONAS AERUGINOSA  Final   Organism ID, Bacteria VANCOMYCIN RESISTANT ENTEROCOCCUS  Final      Susceptibility   Pseudomonas aeruginosa - MIC*    CEFTAZIDIME 4 SENSITIVE Sensitive     CIPROFLOXACIN 1 SENSITIVE Sensitive     GENTAMICIN <=1 SENSITIVE Sensitive     IMIPENEM 1 SENSITIVE Sensitive     CEFEPIME 4 SENSITIVE Sensitive     * RARE PSEUDOMONAS AERUGINOSA   Vancomycin resistant enterococcus - MIC*    AMPICILLIN Value in next row Resistant      RESISTANT>=32    VANCOMYCIN Value in next row Resistant      RESISTANT>=32  GENTAMICIN SYNERGY Value in next row Sensitive      RESISTANT>=32    LINEZOLID Value in next row Sensitive      SENSITIVE2    * LIGHT GROWTH VANCOMYCIN RESISTANT ENTEROCOCCUS  MRSA PCR Screening     Status: None   Collection Time: 05/20/15 12:30 PM  Result Value Ref Range Status   MRSA by PCR NEGATIVE NEGATIVE Final    Comment:        The GeneXpert MRSA Assay (FDA approved for NASAL specimens only), is one component of a comprehensive MRSA colonization surveillance program. It is not intended to diagnose MRSA infection nor to guide or monitor treatment for MRSA infections.   Anaerobic culture     Status: None   Collection Time: 05/24/15  8:31 PM  Result Value Ref Range Status   Specimen Description GROIN RIGHT  Final   Special Requests NONE  Final   Culture NO ANAEROBES ISOLATED  Final    Report Status 05/28/2015 FINAL  Final  Wound culture     Status: None   Collection Time: 05/24/15  9:13 PM  Result Value Ref Range Status   Specimen Description GROIN RIGHT  Final   Special Requests NONE  Final   Gram Stain FEW WBC SEEN MANY GRAM POSITIVE COCCI IN PAIRS   Final   Culture HEAVY GROWTH VANCOMYCIN RESISTANT ENTEROCOCCUS  Final   Report Status 05/27/2015 FINAL  Final   Organism ID, Bacteria VANCOMYCIN RESISTANT ENTEROCOCCUS  Final      Susceptibility   Vancomycin resistant enterococcus - MIC*    AMPICILLIN >=32 RESISTANT Resistant     VANCOMYCIN >=32 RESISTANT Resistant     GENTAMICIN SYNERGY SENSITIVE Sensitive     LINEZOLID 2 SENSITIVE Sensitive     * HEAVY GROWTH VANCOMYCIN RESISTANT ENTEROCOCCUS  Body fluid culture     Status: None   Collection Time: 05/31/15 10:57 AM  Result Value Ref Range Status   Specimen Description CYTOPLEU  Final   Special Requests NONE  Final   Gram Stain   Final    MODERATE RED BLOOD CELLS FEW WBC SEEN NO ORGANISMS SEEN    Culture No growth aerobically or anaerobically.  Final   Report Status 06/06/2015 FINAL  Final    Coagulation Studies: No results for input(s): LABPROT, INR in the last 72 hours.  Urinalysis: No results for input(s): COLORURINE, LABSPEC, PHURINE, GLUCOSEU, HGBUR, BILIRUBINUR, KETONESUR, PROTEINUR, UROBILINOGEN, NITRITE, LEUKOCYTESUR in the last 72 hours.  Invalid input(s): APPERANCEUR    Imaging: Dg Chest 2 View  06/15/2015  CLINICAL DATA:  Dyspnea and hypertension during dialysis EXAM: CHEST  2 VIEW COMPARISON:  05/31/2015 FINDINGS: Right dialysis catheter is again seen and stable. The cardiac shadow is within normal limits. Small bilateral pleural effusions are again noted. No focal infiltrate is noted. Mild atelectasis is likely present adjacent to the effusions. Aortic stent graft is noted in the abdomen. IMPRESSION: Small bilateral pleural effusions with likely adjacent atelectasis. Electronically Signed    By: Inez Catalina M.D.   On: 06/15/2015 16:32     Medications:     . sodium chloride   Intravenous Once  . acetaminophen  1,000 mg Oral 3 times per day  . atorvastatin  40 mg Oral Daily  . [START ON 06/18/2015] ceftAZIDime (FORTAZ) IVPB 1 gram/50 mL D5W (Pyxis)  1 g Intravenous Q T,Th,Sa-HD  . docusate sodium  100 mg Oral BID  . famotidine  20 mg Oral Daily  . fluticasone  2 spray Each  Nare Daily  . fluticasone furoate-vilanterol  1 puff Inhalation Daily  . hydrALAZINE  10 mg Oral Q6H  . Influenza vac split quadrivalent PF  0.5 mL Intramuscular Tomorrow-1000  . levothyroxine  88 mcg Oral Daily  . metoprolol  25 mg Oral 3 times per day  . multivitamin  1 tablet Oral Daily  . pantoprazole (PROTONIX) IV  40 mg Intravenous Q12H  . sodium chloride  1 spray Each Nare 5 X Daily  . sodium chloride flush  3 mL Intravenous Q12H   sodium chloride, acetaminophen **OR** acetaminophen, albuterol, ALPRAZolam, alum & mag hydroxide-simeth, HYDROcodone-acetaminophen, loperamide, magnesium hydroxide, ondansetron **OR** ondansetron (ZOFRAN) IV, ondansetron, promethazine, senna-docusate, sodium chloride flush  Assessment/ Plan:  65 y.o. female with COPD, hypertension, hyperlipidemia, vaginal intraepithelial neoplasia, emergent AAA repair status post rupture on November 18, left to right femoral to femoral bypass , acute renal failure leading to permanent dialysis, significant 3 vessel disease, HIT positive, thrombocytopenia.  1.  ESRD on HD:  Has had hypotension with HD recently. Anemia and decreased circulating volume may be playing a role.   -No acute indication for dialysis today. We will plan for dialysis again tomorrow.  2.  Anemia of CKD/suspected blood loss:  Hemoglobin continues to fluctuate. Patient also reports that she's had dark stools. Discussed with hospitalist. [place] gastroenterology consultation.  3.  SHPTH:  Check ipth/phos with HD.  4.  Thrombocytopenia:  HIT positive, pack catheter  with citrate while here.      LOS: 2 Terisha [place] 3/20/201711:14 AM

## 2015-06-17 NOTE — Progress Notes (Signed)
[facility] [place] NAME: [name]    MR#:  [id number]  DATE OF BIRTH:  1950/09/19  SUBJECTIVE: Patient's ood pressure is better. No further chest pain. Received total of 2 units of packed RBC transfusion.   CHIEF COMPLAINT:   Chief Complaint  Patient presents with  . Hypotension  . Chest Pain   - admitted with hypotension during dialysis - asymptomatic now, feels extremely weak. Low hb inspite of 1 unit transfusion yesterday. - 1 more unit ordered today. From [facility]  REVIEW OF SYSTEMS:  Review of Systems  Constitutional: Positive for malaise/fatigue. Negative for fever and chills.  HENT: Negative for ear discharge, ear pain and nosebleeds.   Eyes: Negative for blurred vision and double vision.  Respiratory: Negative for cough, shortness of breath and wheezing.   Cardiovascular: Negative for chest pain, palpitations and leg swelling.  Gastrointestinal: Negative for nausea, vomiting, abdominal pain, diarrhea and constipation.  Genitourinary: Negative for dysuria and urgency.  Musculoskeletal: Negative for myalgias and back pain.  Neurological: Negative for dizziness, sensory change, speech change, focal weakness, seizures and headaches.  Psychiatric/Behavioral: Negative for depression.    DRUG ALLERGIES:   Allergies  Allergen Reactions  . Asa [Aspirin] Other (See Comments)    unknown  . 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 Plavix [Clopidogrel] Other (See Comments)    unknown    VITALS:  Blood pressure 152/59, pulse 76, temperature 97.6 F (36.4 C), temperature source Oral, resp. rate 20, height 5\' 4"  (1.626 m), weight 55.339 kg (122 lb), last menstrual period 03/30/1988, SpO2 98 %.  PHYSICAL EXAMINATION:  Physical Exam  GENERAL:  65 y.o.-year-old patient lying in the bed with no acute distress. Pale appearing EYES: Pupils equal, round, reactive to light and  accommodation. No scleral icterus. Extraocular muscles intact.  HEENT: Head atraumatic, normocephalic. Oropharynx and nasopharynx clear.  NECK:  Supple, no jugular venous distention. No thyroid enlargement, no tenderness.  LUNGS: Normal breath sounds bilaterally, no wheezing, rales,rhonchi or crepitation. No use of accessory muscles of respiration. Decreased bibasilar breath sounds CARDIOVASCULAR: S1, S2 normal. No murmurs, rubs, or gallops.  ABDOMEN: Soft, nontender, nondistended. Bowel sounds present. No organomegaly or mass.  EXTREMITIES: No pedal edema, cyanosis, or clubbing. Right groin wound vac in place, no evidence of infection, erythema otherwise. NEUROLOGIC: Cranial nerves II through XII are intact. Muscle strength 5/5 in all extremities. Sensation intact. Gait not checked.  PSYCHIATRIC: The patient is alert and oriented x 3.  SKIN: No obvious rash, lesion, or ulcer.    LABORATORY PANEL:   CBC  Recent Labs Lab 06/17/15 0531  WBC 5.2  HGB 7.6*  HCT 21.9*  PLT 80*   ------------------------------------------------------------------------------------------------------------------  Chemistries   Recent Labs Lab 06/15/15 1428  06/17/15 0531  NA 135  < > 133*  K 3.0*  < > 3.4*  CL 98*  < > 98*  CO2 30  < > 25  GLUCOSE 90  < > 76  BUN 25*  < > 53*  CREATININE 3.06*  < > 4.46*  CALCIUM 8.0*  < > 8.3*  AST 31  --   --   ALT 14  --   --   ALKPHOS 78  --   --   BILITOT 0.6  --   --   < > = values in this interval not displayed. ------------------------------------------------------------------------------------------------------------------  Cardiac Enzymes  Recent Labs Lab 06/15/15 1428  TROPONINI <0.03   ------------------------------------------------------------------------------------------------------------------  RADIOLOGY:  Dg Chest 2 View  06/15/2015  CLINICAL DATA:  Dyspnea and hypertension during dialysis EXAM: CHEST  2 VIEW COMPARISON:  05/31/2015  FINDINGS: Right dialysis catheter is again seen and stable. The cardiac shadow is within normal limits. Small bilateral pleural effusions are again noted. No focal infiltrate is noted. Mild atelectasis is likely present adjacent to the effusions. Aortic stent graft is noted in the abdomen. IMPRESSION: Small bilateral pleural effusions with likely adjacent atelectasis. Electronically Signed   By: Inez Catalina M.D.   On: 06/15/2015 16:32    EKG:   Orders placed or performed during the hospital encounter of 06/15/15  . ED EKG  . ED EKG  . EKG 12-Lead  . EKG 12-Lead    ASSESSMENT AND PLAN:   Extensive 53-year-old female with past medical history significant for ESRD on Tuesday Thursday Saturday hemodialysis, recent pseudomonal right groin seroma infection status post wound VAC placement, hypertension and history of pneumonia and diastolic CHF exacerbation presents to the hospital from [place] secondary to hypotension during dialysis  #1 hypotension- likely hypovolemic and also from anemia - Improved.   will need midodrine with dialysis.   p2 Acute on chronic anemia- anemia of chronic disease. Recent adm requiring transfusions .this visit    S/[p 2 unts of  RBC transfusion. Hb dropped again from 8.9  To 7.6. No melena.  watch for further drop in hemoglobin and transfuse accordingly. monitor another 24 hours  .#3right groin infected seroma with Pseudomonas-continue Fortaz with dialysis  #4 end-stage renal disease with hemodialysis-on Tuesday Thursday Saturday schedule. Nephrology has been consulted. Dialysis yesterday was not finished due to hypotension. Further management per nephrology team.  #5 hypertension- due to Hypotension ,Norvasc,hydrallazine have been discontinued. Continue low-dose metoprolol for now.  #6 DVT prophylaxis-Ted's and SCDs. No heparin products due to anemia.   All the records are reviewed and case discussed with Care Management/Social Workerr. Management  plans discussed with the patient, family and they are in agreement.  CODE STATUS: Full Code  TOTAL TIME TAKING CARE OF THIS PATIENT: 25 minutes.   POSSIBLE D/C IN 2 DAYS, DEPENDING ON CLINICAL CONDITION.   Epifanio Lesches M.D on 06/17/2015 at 10:03 AM  Between 7am to 6pm - Pager - [phone]  After 6pm go to [email or web address] - password [email or web address] [facility]  Office  [phone]  CC: Primary care physician; Arnette Norris, MD

## 2015-06-17 NOTE — Consult Note (Signed)
See Dawson Bills note.  Pt with renal failure, on hemodialysis Tue, Thur, Sat.  Hgb fell some after transfusion.  She is s/p femoral femoral by pass,  Her platelet ct has fallen from 238 to 80.  Exam shows pink tongue and fingers so likely no further fall in hgb today.  Will try to do EGD Wednesday.
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
## 2015-06-17 NOTE — NC FL2 (Signed)
[place] LEVEL OF CARE SCREENING TOOL     IDENTIFICATION  Patient Name: [name] Birthdate: 1950/10/22 Sex: female Admission Date (Current Location): 06/15/2015  [facility] and [place] Number:  Engineering geologist and Address:  [facility], [address] [place], [place] [address]      Provider Number: [id number]  Attending Physician Name and Address:  Epifanio Lesches, MD  Relative Name and Phone Number:       Current Level of Care: Hospital Recommended Level of Care: [place] Prior Approval Number:    Date Approved/Denied:   PASRR Number:    Discharge Plan: SNF    Current Diagnoses: Patient Active Problem List   Diagnosis Date Noted  . Anemia 06/15/2015  . Pressure ulcer 05/30/2015  . Sepsis ([place]) 05/20/2015  . Bilateral pleural effusion 04/22/2015  . Anasarca 04/22/2015  . Seroma complicating procedure [id number]  . Thrombocytopenia ([place]) 04/22/2015  . Anemia of chronic disease 04/22/2015  . H/O blood transfusion reaction 04/22/2015  . HIT (heparin-induced thrombocytopenia) ([place]) 04/22/2015  . Generalized weakness 04/22/2015  . Malnutrition of moderate degree 04/19/2015  . Acute pulmonary edema with congestive heart failure ([place]) 04/09/2015  . Leukocytosis 04/09/2015  . Malignant essential hypertension 04/09/2015  . Dysphagia 04/09/2015  . Elevated troponin 04/09/2015  . Acute diastolic CHF (congestive heart failure) ([place]) 04/09/2015  . COPD exacerbation ([place]) 01/03/2015  . Alopecia 09/24/2014  . VAIN II (vaginal intraepithelial neoplasia grade II) 11/22/2013    Class: History of  . Postmenopausal HRT (hormone replacement therapy) 09/18/2013  . Pernicious anemia 09/18/2013  . Elevated liver function tests 09/14/2011  . Hypothyroidism 05/30/2010  . HLD (hyperlipidemia) 05/30/2010  . TOBACCO ABUSE 05/30/2010  . Essential hypertension 05/30/2010  . Abdominal aortic aneurysm ([place])  05/30/2010  . COPD 05/30/2010  . GERD 05/30/2010    Orientation RESPIRATION BLADDER Height & Weight     Self, Time, Situation, Place  Normal Continent Weight: 122 lb (55.339 kg) Height:  5\' 4"  (162.6 cm)  BEHAVIORAL SYMPTOMS/MOOD NEUROLOGICAL BOWEL NUTRITION STATUS   (none)  (none) Continent Diet (renal carb modified)  AMBULATORY STATUS COMMUNICATION OF NEEDS Skin   Extensive Assist Verbally Surgical wounds                       Personal Care Assistance Level of Assistance  Bathing, Dressing Bathing Assistance: Limited assistance Feeding assistance: Limited assistance Dressing Assistance: Limited assistance     Functional Limitations Info             SPECIAL CARE FACTORS FREQUENCY         isolation for vre              Contractures Contractures Info: Not present    Additional Factors Info                  Current Medications (06/17/2015):  This is the current hospital active medication list Current Facility-Administered Medications  Medication Dose Route Frequency Provider Last Rate Last Dose  . 0.9 %  sodium chloride infusion   Intravenous Once Sital Mody, MD      . 0.9 %  sodium chloride infusion  250 mL Intravenous PRN Bettey Costa, MD      . acetaminophen (TYLENOL) tablet 650 mg  650 mg Oral Q6H PRN Bettey Costa, MD       Or  . acetaminophen (TYLENOL) suppository 650 mg  650 mg Rectal Q6H PRN Bettey Costa, MD      .  acetaminophen (TYLENOL) tablet 1,000 mg  1,000 mg Oral 3 times per day Bettey Costa, MD   1,000 mg at 06/17/15 0514  . albuterol (PROVENTIL) (2.5 MG/3ML) 0.083% nebulizer solution 2.5 mg  2.5 mg Nebulization Q6H PRN Bettey Costa, MD      . ALPRAZolam Duanne Moron) tablet 0.25 mg  0.25 mg Oral BID PRN Bettey Costa, MD      . alum & mag hydroxide-simeth (MAALOX/MYLANTA) 200-200-20 MG/5ML suspension 30 mL  30 mL Oral Q6H PRN Bettey Costa, MD   30 mL at 06/15/15 2311  . atorvastatin (LIPITOR) tablet 40 mg  40 mg Oral Daily Bettey Costa, MD   40 mg at 06/17/15  1052  . [START ON 06/18/2015] cefTAZidime (FORTAZ) 1 g in dextrose 5 % 50 mL IVPB  1 g Intravenous Q T,Th,Sa-HD Sital Mody, MD      . docusate sodium (COLACE) capsule 100 mg  100 mg Oral BID Bettey Costa, MD   100 mg at 06/15/15 2301  . famotidine (PEPCID) tablet 20 mg  20 mg Oral Daily Bettey Costa, MD   20 mg at 06/17/15 1052  . fluticasone (FLONASE) 50 MCG/ACT nasal spray 2 spray  2 spray Each Nare Daily Bettey Costa, MD   2 spray at 06/17/15 1114  . fluticasone furoate-vilanterol (BREO ELLIPTA) 100-25 MCG/INH 1 puff  1 puff Inhalation Daily Bettey Costa, MD   1 puff at 06/17/15 1000  . hydrALAZINE (APRESOLINE) tablet 10 mg  10 mg Oral Q6H Sital Mody, MD   10 mg at 06/17/15 1110  . HYDROcodone-acetaminophen (NORCO/VICODIN) 5-325 MG per tablet 1-2 tablet  1-2 tablet Oral Q4H PRN Bettey Costa, MD   1 tablet at 06/16/15 0224  . Influenza vac split quadrivalent PF (FLUARIX) injection 0.5 mL  0.5 mL Intramuscular Tomorrow-1000 Sital Mody, MD   0.5 mL at 06/16/15 1000  . levothyroxine (SYNTHROID, LEVOTHROID) tablet 88 mcg  88 mcg Oral Daily Bettey Costa, MD   88 mcg at 06/17/15 1051  . loperamide (IMODIUM) capsule 2 mg  2 mg Oral Q4H PRN Bettey Costa, MD      . magnesium hydroxide (MILK OF MAGNESIA) suspension 30 mL  30 mL Oral Daily PRN Bettey Costa, MD      . metoprolol tartrate (LOPRESSOR) tablet 25 mg  25 mg Oral 3 times per day Gladstone Lighter, MD   25 mg at 06/17/15 0514  . multivitamin (RENA-VIT) tablet 1 tablet  1 tablet Oral Daily Bettey Costa, MD   1 tablet at 06/17/15 1053  . ondansetron (ZOFRAN) tablet 4 mg  4 mg Oral Q6H PRN Bettey Costa, MD       Or  . ondansetron (ZOFRAN) injection 4 mg  4 mg Intravenous Q6H PRN Sital Mody, MD      . ondansetron (ZOFRAN) tablet 4 mg  4 mg Oral Q6H PRN Sital Mody, MD      . pantoprazole (PROTONIX) injection 40 mg  40 mg Intravenous Q12H Epifanio Lesches, MD   40 mg at 06/17/15 1113  . promethazine (PHENERGAN) injection 6.25 mg  6.25 mg Intravenous Q6H PRN Bettey Costa,  MD      . senna-docusate (Senokot-S) tablet 1 tablet  1 tablet Oral QHS PRN Bettey Costa, MD      . sodium chloride (OCEAN) 0.65 % nasal spray 1 spray  1 spray Each Nare 5 X Daily Bettey Costa, MD   1 spray at 06/17/15 0515  . sodium chloride flush (NS) 0.9 % injection 3 mL  3 mL  Intravenous Q12H Bettey Costa, MD   3 mL at 06/17/15 1114  . sodium chloride flush (NS) 0.9 % injection 3 mL  3 mL Intravenous PRN Bettey Costa, MD         Discharge Medications: Please see discharge summary for a list of discharge medications.  Relevant Imaging Results:  Relevant Lab Results:   Additional Information    Shela Leff, LCSW

## 2015-06-17 NOTE — Progress Notes (Signed)
Initial Nutrition Assessment     INTERVENTION:  Meals and snacks: Cater to pt preferences Medical Nutrition Supplement Therapy: Pt does not like supplements of any kind (nepro, ensure, prostat, etc). Reports has been refusing them at [place]:   Increased nutrient needs related to chronic illness as evidenced by estimated needs.    GOAL:   Patient will meet greater than or equal to 90% of their needs    MONITOR:   PO intake, Labs, Skin  REASON FOR ASSESSMENT:    (pressure ulcer)    ASSESSMENT:      Pt admitted with hypotension  Past Medical History  Diagnosis Date  . Hyperlipidemia   . Hypertension   . Thyroid disease   . GERD (gastroesophageal reflux disease)   . Aneurysm ([place])     abdominal aortic  . STD (sexually transmitted disease)     chlamydia  . VAIN II (vaginal intraepithelial neoplasia grade II) 7/09  . 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 B12 deficiency   . COPD (chronic obstructive pulmonary disease) ([place])   . Renal insufficiency   . Cancer ([place])     skin on left ankle 04/10/15 pt states she has never had cancer  . ESRD (end stage renal disease) ([place])   . Diastolic heart failure ([facility])     Current Nutrition: eating about 50% of meals. Reports that is normal for her  Food/Nutrition-Related History: normal intake prior to admission    Scheduled Medications:  . sodium chloride   Intravenous Once  . acetaminophen  1,000 mg Oral 3 times per day  . atorvastatin  40 mg Oral Daily  . [START ON 06/18/2015] ceftAZIDime (FORTAZ) IVPB 1 gram/50 mL D5W (Pyxis)  1 g Intravenous Q T,Th,Sa-HD  . docusate sodium  100 mg Oral BID  . famotidine  20 mg Oral Daily  . fluticasone  2 spray Each Nare Daily  . fluticasone furoate-vilanterol  1 puff Inhalation Daily  . hydrALAZINE  10 mg Oral 3 times per day  . Influenza vac split quadrivalent PF  0.5 mL Intramuscular Tomorrow-1000  . levothyroxine  88 mcg Oral Daily  .  metoprolol  25 mg Oral 3 times per day  . multivitamin  1 tablet Oral Daily  . pantoprazole (PROTONIX) IV  40 mg Intravenous Q12H  . sodium chloride  1 spray Each Nare 5 X Daily  . sodium chloride flush  3 mL Intravenous Q12H        Electrolyte/Renal Profile and Glucose Profile:   Recent Labs Lab 06/15/15 1428 06/16/15 0544 06/17/15 0531  NA 135 136 133*  K 3.0* 3.1* 3.4*  CL 98* 101 98*  CO2 30 25 25   BUN 25* 35* 53*  CREATININE 3.06* 3.69* 4.46*  CALCIUM 8.0* 8.2* 8.3*  GLUCOSE 90 71 76   Protein Profile:  Recent Labs Lab 06/15/15 1428  ALBUMIN 2.3*    Weight Change: wt encounters reviewed    Diet Order:  Diet renal/carb modified with fluid restriction Diet-HS Snack?: Nothing; Room service appropriate?: Yes; Fluid consistency:: Thin  Skin:   reviewed  Height:   Ht Readings from Last 1 Encounters:  06/15/15 5\' 4"  (1.626 m)    Weight:   Wt Readings from Last 1 Encounters:  06/15/15 122 lb (55.339 kg)    Ideal Body Weight:     BMI:  Body mass index is 20.93 kg/(m^2).  Estimated Nutritional Needs:   Kcal:  BEE 1075 kcals (IF 1.0-1.2,  AF 1.3) BS:[id number] kcals/d.   Protein:  (1.2-1.5 g/kg) 66-83 g/d  Fluid:  (1000 + UOP)  EDUCATION NEEDS:   No education needs identified at this time  [place]. Zenia Resides, [place], [place] (pager) Weekend/On-Call pager [phone])

## 2015-06-17 NOTE — Consult Note (Signed)
WOC wound consult note Reason for Consult: NPWT )VAC) dressing to right inguinal area. S/P I&D of bilateral seroma with reoperatin of the right Now with healing  Wound type:Chronic nonhealing surgical wound Pressure Ulcer POA: N/A Measurement:4 cm x 3 cm x 2.2 cm  Wound bed:100% pale pink granulation tissue noted.  Drainage (amount, consistency, odor) Minimal serosanguinous  No odor.  Periwound:Intact Dressing procedure/placement/frequency:Cleanse wound with NS and pat gently dry.  Gently fill wound defect with black Granufoam.  Cover with drape.  Seal immediately achieved at 113mmHg.  [place] team will follow  Domenic Moras RN BSN [facility] Pager [phone]

## 2015-06-18 LAB — CBC
HEMATOCRIT: 21.4 % — AB (ref 35.0–47.0)
HEMOGLOBIN: 7.4 g/dL — AB (ref 12.0–16.0)
MCH: 29.9 pg (ref 26.0–34.0)
MCHC: 34.3 g/dL (ref 32.0–36.0)
MCV: 87.1 fL (ref 80.0–100.0)
Platelets: 97 10*3/uL — ABNORMAL LOW (ref 150–440)
RBC: 2.46 MIL/uL — AB (ref 3.80–5.20)
RDW: 19.9 % — AB (ref 11.5–14.5)
WBC: 5 10*3/uL (ref 3.6–11.0)

## 2015-06-18 LAB — MAGNESIUM: Magnesium: 1.9 mg/dL (ref 1.7–2.4)

## 2015-06-18 LAB — CK: CK TOTAL: 25 U/L — AB (ref 38–234)

## 2015-06-18 LAB — HEPATITIS B SURFACE ANTIGEN: Hepatitis B Surface Ag: NEGATIVE

## 2015-06-18 LAB — PHOSPHORUS: PHOSPHORUS: 2.2 mg/dL — AB (ref 2.5–4.6)

## 2015-06-18 MED ORDER — AMLODIPINE BESYLATE 5 MG PO TABS
5.0000 mg | ORAL_TABLET | Freq: Every day | ORAL | Status: DC
  Administered 2015-06-19 – 2015-06-24 (×4): 5 mg via ORAL
  Filled 2015-06-18 (×5): qty 1

## 2015-06-18 MED ORDER — SODIUM CHLORIDE 0.9 % IV SOLN
4.0000 mg/kg | INTRAVENOUS | Status: DC
  Administered 2015-06-18: 253 mg via INTRAVENOUS
  Filled 2015-06-18: qty 5.06

## 2015-06-18 NOTE — Progress Notes (Signed)
[place] [facility] [place] NAME: [name]    MR#:  [id number]  DATE OF BIRTH:  [birth year]  SUBJECTIVE: Denies any complaints. Hemoglobin stable. Scheduled for EGD tomorrow.   CHIEF COMPLAINT:   Chief Complaint  Patient presents with  . Hypotension  . Chest Pain   - admitted with hypotension during dialysis - asymptomatic now, feels extremely weak. Low hb inspite of 1 unit transfusion yesterday. - 1 more unit ordered today. From [facility]  REVIEW OF SYSTEMS:  Review of Systems  Constitutional: Positive for malaise/fatigue. Negative for fever and chills.  HENT: Negative for ear discharge, ear pain and nosebleeds.   Eyes: Negative for blurred vision and double vision.  Respiratory: Negative for cough, shortness of breath and wheezing.   Cardiovascular: Negative for chest pain, palpitations and leg swelling.  Gastrointestinal: Negative for nausea, vomiting, abdominal pain, diarrhea and constipation.  Genitourinary: Negative for dysuria and urgency.  Musculoskeletal: Negative for myalgias and back pain.  Neurological: Negative for dizziness, sensory change, speech change, focal weakness, seizures and headaches.  Psychiatric/Behavioral: Negative for depression.    DRUG ALLERGIES:   Allergies  Allergen Reactions  . Asa [Aspirin] Other (See Comments)    unknown  . 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 Plavix [Clopidogrel] Other (See Comments)    unknown    VITALS:  Blood pressure 147/69, pulse 70, temperature 97.9 F (36.6 C), temperature source Oral, resp. rate 11, height 5\' 4"  (1.626 m), weight 64.4 kg (141 lb 15.6 oz), last menstrual period 03/30/1988, SpO2 100 %.  PHYSICAL EXAMINATION:  Physical Exam  GENERAL:  65 y.o.-year-old patient lying in the bed with no acute distress. Pale appearing EYES: Pupils equal, round, reactive to light and accommodation. No scleral icterus.  Extraocular muscles intact.  HEENT: Head atraumatic, normocephalic. Oropharynx and nasopharynx clear.  NECK:  Supple, no jugular venous distention. No thyroid enlargement, no tenderness.  LUNGS: Normal breath sounds bilaterally, no wheezing, rales,rhonchi or crepitation. No use of accessory muscles of respiration. Decreased bibasilar breath sounds CARDIOVASCULAR: S1, S2 normal. No murmurs, rubs, or gallops.  ABDOMEN: Soft, nontender, nondistended. Bowel sounds present. No organomegaly or mass.  EXTREMITIES: No pedal edema, cyanosis, or clubbing. Right groin wound vac in place, no evidence of infection, erythema otherwise. NEUROLOGIC: Cranial nerves II through XII are intact. Muscle strength 5/5 in all extremities. Sensation intact. Gait not checked.  PSYCHIATRIC: The patient is alert and oriented x 3.  SKIN: No obvious rash, lesion, or ulcer.    LABORATORY PANEL:   CBC  Recent Labs Lab 06/18/15 0637  WBC 5.0  HGB 7.4*  HCT 21.4*  PLT 97*   ------------------------------------------------------------------------------------------------------------------  Chemistries   Recent Labs Lab 06/15/15 1428  06/17/15 0531 06/18/15 0637  NA 135  < > 133*  --   K 3.0*  < > 3.4*  --   CL 98*  < > 98*  --   CO2 30  < > 25  --   GLUCOSE 90  < > 76  --   BUN 25*  < > 53*  --   CREATININE 3.06*  < > 4.46*  --   CALCIUM 8.0*  < > 8.3*  --   MG  --   --   --  1.9  AST 31  --   --   --   ALT 14  --   --   --  ALKPHOS 78  --   --   --   BILITOT 0.6  --   --   --   < > = values in this interval not displayed. ------------------------------------------------------------------------------------------------------------------  Cardiac Enzymes  Recent Labs Lab 06/15/15 1428  TROPONINI <0.03   ------------------------------------------------------------------------------------------------------------------  RADIOLOGY:  No results found.  EKG:   Orders placed or performed during the  hospital encounter of 06/15/15  . ED EKG  . ED EKG  . EKG 12-Lead  . EKG 12-Lead    ASSESSMENT AND PLAN:   Extensive 65 year old female with past medical history significant for ESRD on Tuesday Thursday Saturday hemodialysis, recent pseudomonal right groin seroma infection status post wound VAC placement, hypertension and history of pneumonia and diastolic CHF exacerbation presents to the hospital from [place] secondary to hypotension during dialysis  #1 hypotension- likely hypovolemic and also from anemia - Improved.    2 .Acute on chronic anemia- anemia of chronic disease. Recent adm requiring transfusions .this visit    S/[p 2 unts of  RBC transfusion. Hemoglobin stable at 7.4. Because of guaiac-positive stools and anemia GI is consulted. Continue IV PPIs, scheduled for EGD tomorrow. .#3right groin infected seroma with Pseudomonas-continue Fortaz with dialysis  #4 end-stage renal disease with hemodialysis-on Tuesday Thursday Saturday schedule. Nephrology has been consulted.   #5 hypertension- resolved hypotension. Resume Norvasc. Continue to hold hydralazine, Avapro, metoprolol.   . #6 DVT prophylaxis-Ted's and SCDs. No heparin products due to anemia.   All the records are reviewed and case discussed with Care Management/Social Workerr. Management plans discussed with the patient, family and they are in agreement.  CODE STATUS: Full Code  TOTAL TIME TAKING CARE OF THIS PATIENT: 25 minutes.   POSSIBLE D/C IN 2 DAYS, DEPENDING ON CLINICAL CONDITION.   Epifanio Lesches M.D on 06/18/2015 at 12:11 PM  Between 7am to 6pm - Pager - [phone]  After 6pm go to [email or web address] - password [email or web address] [facility]  Office  [phone]  CC: Primary care physician; Arnette Norris, MD

## 2015-06-18 NOTE — Progress Notes (Signed)
[facility]  ROUNDING NOTE   Subjective:  Patient seen and evaluated during hemodialysis. Tolerating well. Her blood pressure has maintained today. EGD for tomorrow. Hemoglobin currently 7.4.  Objective:  Vital signs in last 24 hours:  Temp:  [97.3 F (36.3 C)-97.9 F (36.6 C)] 97.9 F (36.6 C) (03/21 1120) Pulse Rate:  [70-87] 76 (03/21 1430) Resp:  [11-18] 12 (03/21 1430) BP: (124-162)/(61-71) 145/66 mmHg (03/21 1430) SpO2:  [98 %-100 %] 100 % (03/21 1430) Weight:  [64.4 kg (141 lb 15.6 oz)] 64.4 kg (141 lb 15.6 oz) (03/21 1115)  Weight change:  Filed Weights   06/15/15 1433 06/18/15 1115  Weight: 55.339 kg (122 lb) 64.4 kg (141 lb 15.6 oz)    Intake/Output: I/O last 3 completed shifts: In: 480 [P.O.:480] Out: 0    Intake/Output this shift:     Physical Exam: General: NAD, resting in bed  Head: Normocephalic, atraumatic. Moist oral mucosal membranes  Eyes: Anicteric  Neck: Supple, trachea midline  Lungs:  Clear to auscultation normal effort  Heart: S1S2 no rubs  Abdomen:  Soft, nontender, BS present  Extremities: Trace LEedema.  Neurologic: Nonfocal, moving all four extremities  Skin: No lesions  Access: R IJ PC    Basic Metabolic Panel:  Recent Labs Lab 06/15/15 1428 06/16/15 0544 06/17/15 0531 06/18/15 0637  NA 135 136 133*  --   K 3.0* 3.1* 3.4*  --   CL 98* 101 98*  --   CO2 30 25 25   --   GLUCOSE 90 71 76  --   BUN 25* 35* 53*  --   CREATININE 3.06* 3.69* 4.46*  --   CALCIUM 8.0* 8.2* 8.3*  --   MG  --   --   --  1.9    Liver Function Tests:  Recent Labs Lab 06/15/15 1428  AST 31  ALT 14  ALKPHOS 78  BILITOT 0.6  PROT 5.7*  ALBUMIN 2.3*   No results for input(s): LIPASE, AMYLASE in the last 168 hours. No results for input(s): AMMONIA in the last 168 hours.  CBC:  Recent Labs Lab 06/15/15 1428 06/16/15 0544 06/16/15 1932 06/17/15 0531 06/18/15 0637  WBC 4.9 4.2  --  5.2 5.0  HGB 6.8* 6.5* 8.9* 7.6* 7.4*   HCT 20.0* 18.9* 26.2* 21.9* 21.4*  MCV 91.6 91.0  --  84.7 87.1  PLT 123* 78*  --  80* 97*    Cardiac Enzymes:  Recent Labs Lab 06/15/15 1428  TROPONINI <0.03    BNP: Invalid input(s): POCBNP  CBG: No results for input(s): GLUCAP in the last 168 hours.  Microbiology: Results for orders placed or performed during the hospital encounter of 05/20/15  Wound culture     Status: None   Collection Time: 05/20/15 12:25 PM  Result Value Ref Range Status   Specimen Description GROIN RIGHT  Final   Special Requests NONE  Final   Gram Stain   Final    FEW WBC SEEN FEW GRAM NEGATIVE RODS RARE GRAM POSITIVE COCCI    Culture   Final    RARE PSEUDOMONAS AERUGINOSA LIGHT GROWTH VANCOMYCIN RESISTANT ENTEROCOCCUS    Report Status 05/25/2015 FINAL  Final   Organism ID, Bacteria PSEUDOMONAS AERUGINOSA  Final   Organism ID, Bacteria VANCOMYCIN RESISTANT ENTEROCOCCUS  Final      Susceptibility   Pseudomonas aeruginosa - MIC*    CEFTAZIDIME 4 SENSITIVE Sensitive     CIPROFLOXACIN 1 SENSITIVE Sensitive     GENTAMICIN <=1 SENSITIVE  Sensitive     IMIPENEM 1 SENSITIVE Sensitive     CEFEPIME 4 SENSITIVE Sensitive     * RARE PSEUDOMONAS AERUGINOSA   Vancomycin resistant enterococcus - MIC*    AMPICILLIN Value in next row Resistant      RESISTANT>=32    VANCOMYCIN Value in next row Resistant      RESISTANT>=32    GENTAMICIN SYNERGY Value in next row Sensitive      RESISTANT>=32    LINEZOLID Value in next row Sensitive      SENSITIVE2    * LIGHT GROWTH VANCOMYCIN RESISTANT ENTEROCOCCUS  MRSA PCR Screening     Status: None   Collection Time: 05/20/15 12:30 PM  Result Value Ref Range Status   MRSA by PCR NEGATIVE NEGATIVE Final    Comment:        The GeneXpert MRSA Assay (FDA approved for NASAL specimens only), is one component of a comprehensive MRSA colonization surveillance program. It is not intended to diagnose MRSA infection nor to guide or monitor treatment for MRSA  infections.   Anaerobic culture     Status: None   Collection Time: 05/24/15  8:31 PM  Result Value Ref Range Status   Specimen Description GROIN RIGHT  Final   Special Requests NONE  Final   Culture NO ANAEROBES ISOLATED  Final   Report Status 05/28/2015 FINAL  Final  Wound culture     Status: None   Collection Time: 05/24/15  9:13 PM  Result Value Ref Range Status   Specimen Description GROIN RIGHT  Final   Special Requests NONE  Final   Gram Stain FEW WBC SEEN MANY GRAM POSITIVE COCCI IN PAIRS   Final   Culture HEAVY GROWTH VANCOMYCIN RESISTANT ENTEROCOCCUS  Final   Report Status 05/27/2015 FINAL  Final   Organism ID, Bacteria VANCOMYCIN RESISTANT ENTEROCOCCUS  Final      Susceptibility   Vancomycin resistant enterococcus - MIC*    AMPICILLIN >=32 RESISTANT Resistant     VANCOMYCIN >=32 RESISTANT Resistant     GENTAMICIN SYNERGY SENSITIVE Sensitive     LINEZOLID 2 SENSITIVE Sensitive     * HEAVY GROWTH VANCOMYCIN RESISTANT ENTEROCOCCUS  Body fluid culture     Status: None   Collection Time: 05/31/15 10:57 AM  Result Value Ref Range Status   Specimen Description CYTOPLEU  Final   Special Requests NONE  Final   Gram Stain   Final    MODERATE RED BLOOD CELLS FEW WBC SEEN NO ORGANISMS SEEN    Culture No growth aerobically or anaerobically.  Final   Report Status 06/06/2015 FINAL  Final    Coagulation Studies: No results for input(s): LABPROT, INR in the last 72 hours.  Urinalysis: No results for input(s): COLORURINE, LABSPEC, PHURINE, GLUCOSEU, HGBUR, BILIRUBINUR, KETONESUR, PROTEINUR, UROBILINOGEN, NITRITE, LEUKOCYTESUR in the last 72 hours.  Invalid input(s): APPERANCEUR    Imaging: No results found.   Medications:     . sodium chloride   Intravenous Once  . acetaminophen  1,000 mg Oral 3 times per day  . amLODipine  5 mg Oral Daily  . atorvastatin  40 mg Oral Daily  . ceftAZIDime (FORTAZ) IVPB 1 gram/50 mL D5W (Pyxis)  1 g Intravenous Q T,Th,Sa-HD  .  docusate sodium  100 mg Oral BID  . famotidine  20 mg Oral Daily  . fluticasone  2 spray Each Nare Daily  . fluticasone furoate-vilanterol  1 puff Inhalation Daily  . hydrALAZINE  10 mg Oral 3 times  per day  . Influenza vac split quadrivalent PF  0.5 mL Intramuscular Tomorrow-1000  . levothyroxine  88 mcg Oral Daily  . metoprolol  25 mg Oral 3 times per day  . multivitamin  1 tablet Oral Daily  . pantoprazole (PROTONIX) IV  40 mg Intravenous Q12H  . sodium chloride  1 spray Each Nare 5 X Daily  . sodium chloride flush  3 mL Intravenous Q12H   sodium chloride, acetaminophen **OR** acetaminophen, albuterol, ALPRAZolam, alum & mag hydroxide-simeth, HYDROcodone-acetaminophen, loperamide, magnesium hydroxide, ondansetron **OR** ondansetron (ZOFRAN) IV, ondansetron, promethazine, senna-docusate, sodium chloride flush  Assessment/ Plan:  65 y.o. female with COPD, hypertension, hyperlipidemia, vaginal intraepithelial neoplasia, emergent AAA repair status post rupture on November 18, left to right femoral to femoral bypass , acute renal failure leading to permanent dialysis, significant 3 vessel disease, HIT positive, thrombocytopenia.  1.  ESRD on HD:  Has had hypotension with HD recently. Anemia and decreased circulating volume may be playing a role.   -Patient seen and evaluated during hemodialysis. Appears to be tolerating much better today after receiving blood over the past several days. Continue to monitor blood pressure during dialysis closely.  2.  Anemia of CKD/suspected blood loss:   -  Patient transfused 2 units PRBCs this admission. -  EGD for tomorrow.  3.  SHPTH:  Check ipth/phos with HD.  4.  Thrombocytopenia:  HIT positive, pack catheter with citrate while here.      LOS: 3 Kody Brandl 3/21/20172:47 PM

## 2015-06-18 NOTE — Care Management Important Message (Signed)
Important Message  Patient Details  Name: [name] MRN: [id number] Date of Birth: [birth year]   Medicare Important Message Given:  Yes    Beverly Sessions, RN 06/18/2015, 10:08 AM

## 2015-06-18 NOTE — Consult Note (Signed)
Patient PE shows eyes, fingers, tongue all look a little more pale than yesterday.  If morning hemoglobin below 7 will need transfusion before doing EGD.  Chest with decreased air flow in both bases.

## 2015-06-18 NOTE — Progress Notes (Signed)
HD Tx Machine & Patient Checks

## 2015-06-18 NOTE — Progress Notes (Signed)
Post HD Tx Assessment  

## 2015-06-18 NOTE — Progress Notes (Signed)
Pre HD Tx Assessment 

## 2015-06-18 NOTE — Progress Notes (Signed)
Pharmacy Antibiotic Note  [name] is a 65 y.o. female with ESRD on HD admitted on [id number] with a complicated vascular history as detailed by Dr Ronalee Belts and Lucky Cowboy. She is currently dealing with a seroma in her R groin,and was admitted last month when this was draining purulent material and with surrounding redness, pain and induration .Culture from the R wound grew pseudomonas and VRE, she underwent I and D 2/24 by Dr Delana Meyer and was discharged 3/4 on IV ceftazidime at HD and PO linezolid  Pharmacy is consulted to dose daptomycin.   Plan: Ordered daptomycin 4mg /kg IV Q48H. Will check baskline CK at this time as well as discontinue statin.   Recommend holding statin for duration of daptomycin and montoring CK at least weekly.  Height: 5\' 4"  (162.6 cm) Weight: 139 lb 5.3 oz (63.2 kg) IBW/kg (Calculated) : 54.7  Temp (24hrs), Avg:97.7 F (36.5 C), Min:97.3 F (36.3 C), Max:98 F (36.7 C)   Recent Labs Lab 06/15/15 1428 06/16/15 0544 06/17/15 0531 06/18/15 0637  WBC 4.9 4.2 5.2 5.0  CREATININE 3.06* 3.69* 4.46*  --     Estimated Creatinine Clearance: 10.7 mL/min (by C-G formula based on Cr of 4.46).    Allergies  Allergen Reactions  . Asa [Aspirin] Other (See Comments)    unknown  . 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 Plavix [Clopidogrel] Other (See Comments)    unknown    Antimicrobials this admission: 3/21 ceftazidime >>  3/21 daptomycin >>   Dose adjustments this admission:   Microbiology results:  BCx:   UCx:    Sputum:    MRSA PCR:   Thank you for allowing pharmacy to be a part of this patient's care.  Easton Sivertson C 06/18/2015 8:11 PM

## 2015-06-18 NOTE — Consult Note (Signed)
hgb 7.4.  No active bleeding.  Will do EGD tomorrow morning.
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
## 2015-06-18 NOTE — Progress Notes (Signed)
HD Tx Termination

## 2015-06-18 NOTE — Progress Notes (Signed)
HD Tx Initiation 

## 2015-06-19 ENCOUNTER — Encounter
Admission: EM | Disposition: A | Discharge status: Skilled Nursing Facility | Payer: Self-pay | Source: Home / Self Care | Attending: Internal Medicine

## 2015-06-19 ENCOUNTER — Inpatient Hospital Stay: Payer: Medicare Other | Admitting: Anesthesiology

## 2015-06-19 ENCOUNTER — Encounter: Payer: Self-pay | Admitting: Anesthesiology

## 2015-06-19 HISTORY — PX: ESOPHAGOGASTRODUODENOSCOPY: SHX5428

## 2015-06-19 LAB — PARATHYROID HORMONE, INTACT (NO CA): PTH: 31 pg/mL (ref 15–65)

## 2015-06-19 SURGERY — EGD (ESOPHAGOGASTRODUODENOSCOPY)
Anesthesia: General

## 2015-06-19 MED ORDER — BUTAMBEN-TETRACAINE-BENZOCAINE 2-2-14 % EX AERO
INHALATION_SPRAY | CUTANEOUS | Status: DC | PRN
  Administered 2015-06-19: 1 via TOPICAL

## 2015-06-19 MED ORDER — PENTAFLUOROPROP-TETRAFLUOROETH EX AERO: 1.0000 "application " | INHALATION_SPRAY | CUTANEOUS | Status: DC | PRN

## 2015-06-19 MED ORDER — SODIUM CHLORIDE 0.9 % IV SOLN
6.0000 mg/kg | INTRAVENOUS | Status: DC
  Administered 2015-06-20 – 2015-06-22 (×2): 379 mg via INTRAVENOUS
  Filled 2015-06-19 (×3): qty 7.58

## 2015-06-19 MED ORDER — SODIUM CHLORIDE 0.9 % IV SOLN
INTRAVENOUS | Status: DC
  Administered 2015-06-19: 11:00:00 via INTRAVENOUS

## 2015-06-19 MED ORDER — LIDOCAINE HCL (PF) 1 % IJ SOLN
5.0000 mL | INTRAMUSCULAR | Status: DC | PRN
  Filled 2015-06-19: qty 5

## 2015-06-19 MED ORDER — PROPOFOL 10 MG/ML IV BOLUS
INTRAVENOUS | Status: DC | PRN
  Administered 2015-06-19: 50 mg via INTRAVENOUS
  Administered 2015-06-19 (×3): 10 mg via INTRAVENOUS
  Administered 2015-06-19: 20 mg via INTRAVENOUS

## 2015-06-19 MED ORDER — FENTANYL CITRATE (PF) 100 MCG/2ML IJ SOLN
INTRAMUSCULAR | Status: DC | PRN
  Administered 2015-06-19: 25 ug via INTRAVENOUS

## 2015-06-19 MED ORDER — SODIUM CHLORIDE 0.9 % IV SOLN: 100.0000 mL | INTRAVENOUS | Status: DC | PRN

## 2015-06-19 MED ORDER — LEVOTHYROXINE SODIUM 88 MCG PO TABS
88.0000 ug | ORAL_TABLET | Freq: Every day | ORAL | Status: DC
  Administered 2015-06-20 – 2015-06-24 (×5): 88 ug via ORAL
  Filled 2015-06-19 (×6): qty 1

## 2015-06-19 MED ORDER — LIDOCAINE-PRILOCAINE 2.5-2.5 % EX CREA: 1.0000 "application " | TOPICAL_CREAM | CUTANEOUS | Status: DC | PRN

## 2015-06-19 MED ORDER — LIDOCAINE HCL (CARDIAC) 20 MG/ML IV SOLN
INTRAVENOUS | Status: DC | PRN
  Administered 2015-06-19: 30 mg via INTRAVENOUS

## 2015-06-19 MED ORDER — ALTEPLASE 2 MG IJ SOLR
2.0000 mg | Freq: Once | INTRAMUSCULAR | Status: DC | PRN
  Filled 2015-06-19: qty 2

## 2015-06-19 MED ORDER — ANTICOAGULANT SODIUM CITRATE 4% (200MG/5ML) IV SOLN
5.0000 mL | Status: DC | PRN
  Filled 2015-06-19: qty 250

## 2015-06-19 NOTE — Transfer of Care (Signed)
Immediate Anesthesia Transfer of Care Note  Patient: [name]  Procedure(s) Performed: Procedure(s): ESOPHAGOGASTRODUODENOSCOPY (EGD) (N/A)  Patient Location: PACU and Short Stay  Anesthesia Type:General  Level of Consciousness: awake, oriented and patient cooperative  Airway & Oxygen Therapy: Patient Spontanous Breathing and Patient connected to nasal cannula oxygen  Post-op Assessment: Report given to RN and Post -op Vital signs reviewed and stable  Post vital signs: Reviewed and stable  Last Vitals:  Filed Vitals:   06/19/15 0629 06/19/15 0842  BP: 157/56 153/59  Pulse: 70   Temp:    Resp:      Complications: No apparent anesthesia complications

## 2015-06-19 NOTE — Progress Notes (Signed)
[facility] NAME: [name]    MR#:  [id number]  DATE OF BIRTH:  1950-06-19  SUBJECTIVE: [name] for EGD today   CHIEF COMPLAINT:   Chief Complaint  Patient presents with  . Hypotension  . Chest Pain   - admitted with hypotension during dialysis - asymptomatic now, feels extremely weak. Low hb inspite of 1 unit transfusion yesterday. - 1 more unit ordered today. From [facility]  REVIEW OF SYSTEMS:  Review of Systems  Constitutional: Positive for malaise/fatigue. Negative for fever and chills.  HENT: Negative for ear discharge, ear pain and nosebleeds.   Eyes: Negative for blurred vision and double vision.  Respiratory: Negative for cough, shortness of breath and wheezing.   Cardiovascular: Negative for chest pain, palpitations and leg swelling.  Gastrointestinal: Negative for nausea, vomiting, abdominal pain, diarrhea and constipation.  Genitourinary: Negative for dysuria and urgency.  Musculoskeletal: Negative for myalgias and back pain.  Neurological: Negative for dizziness, sensory change, speech change, focal weakness, seizures and headaches.  Psychiatric/Behavioral: Negative for depression.    DRUG ALLERGIES:   Allergies  Allergen Reactions  . Asa [Aspirin] Other (See Comments)    unknown  . 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 Plavix [Clopidogrel] Other (See Comments)    unknown    VITALS:  Blood pressure 153/59, pulse 70, temperature 98.4 F (36.9 C), temperature source Oral, resp. rate 16, height 5\' 4"  (1.626 m), weight 63.2 kg (139 lb 5.3 oz), last menstrual period 03/30/1988, SpO2 99 %.  PHYSICAL EXAMINATION:  Physical Exam  GENERAL:  65 y.o.-year-old patient lying in the bed with no acute distress. Pale appearing EYES: Pupils equal, round, reactive to light and accommodation. No scleral icterus. Extraocular muscles intact.  HEENT: Head atraumatic,  normocephalic. Oropharynx and nasopharynx clear.  NECK:  Supple, no jugular venous distention. No thyroid enlargement, no tenderness.  LUNGS: Normal breath sounds bilaterally, no wheezing, rales,rhonchi or crepitation. No use of accessory muscles of respiration. Decreased bibasilar breath sounds CARDIOVASCULAR: S1, S2 normal. No murmurs, rubs, or gallops.  ABDOMEN: Soft, nontender, nondistended. Bowel sounds present. No organomegaly or mass.  EXTREMITIES: No pedal edema, cyanosis, or clubbing. Right groin wound vac in place, no evidence of infection, erythema otherwise. NEUROLOGIC: Cranial nerves II through XII are intact. Muscle strength 5/5 in all extremities. Sensation intact. Gait not checked.  PSYCHIATRIC: The patient is alert and oriented x 3.  SKIN: No obvious rash, lesion, or ulcer.    LABORATORY PANEL:   CBC  Recent Labs Lab 06/18/15 0637  WBC 5.0  HGB 7.4*  HCT 21.4*  PLT 97*   ------------------------------------------------------------------------------------------------------------------  Chemistries   Recent Labs Lab 06/15/15 1428  06/17/15 0531 06/18/15 0637  NA 135  < > 133*  --   K 3.0*  < > 3.4*  --   CL 98*  < > 98*  --   CO2 30  < > 25  --   GLUCOSE 90  < > 76  --   BUN 25*  < > 53*  --   CREATININE 3.06*  < > 4.46*  --   CALCIUM 8.0*  < > 8.3*  --   MG  --   --   --  1.9  AST 31  --   --   --   ALT 14  --   --   --   ALKPHOS 78  --   --   --  BILITOT 0.6  --   --   --   < > = values in this interval not displayed. ------------------------------------------------------------------------------------------------------------------  Cardiac Enzymes  Recent Labs Lab 06/15/15 1428  TROPONINI <0.03   ------------------------------------------------------------------------------------------------------------------  RADIOLOGY:  No results found.  EKG:   Orders placed or performed during the hospital encounter of 06/15/15  . ED EKG  . ED EKG   . EKG 12-Lead  . EKG 12-Lead    ASSESSMENT AND PLAN:   Extensive 65 year old female with past medical history significant for ESRD on Tuesday Thursday Saturday hemodialysis, recent pseudomonal right groin seroma infection status post wound VAC placement, hypertension and history of pneumonia and diastolic CHF exacerbation presents to the hospital from [place] secondary to hypotension during dialysis  #1 hypotension- likely hypovolemic and also from anemia - Improved.    2 .Acute on chronic anemia- anemia of chronic disease. Recent adm requiring transfusions .this visit    S/[p 2 unts of  RBC transfusion. Hemoglobin stable at 7.4. Because of guaiac-positive stools and anemia GI is consulted. Continue IV PPIs, having EGD now. .#3right groin infected seroma with Pseudomonas-continue Fortaz with dialysis, started on daptomycin by ID.  #4 end-stage renal disease with hemodialysis-on Tuesday Thursday Saturday schedule. Nephrology has been consulted.   #5 hypertension- resolved hypotension. Resumed Norvasc. Continue to hold hydralazine, Avapro, metoprolol. Start tomorrow if BP allows.  . #6 DVT prophylaxis-Ted's and SCDs. No heparin products due to anemia.   All the records are reviewed and case discussed with Care Management/Social Workerr. Management plans discussed with the patient, family and they are in agreement.  CODE STATUS: Full Code  TOTAL TIME TAKING CARE OF THIS PATIENT: 25 minutes.   POSSIBLE D/C IN 2 DAYS, DEPENDING ON CLINICAL CONDITION.   [name] M.D on 06/19/2015 at 12:20 PM  Between 7am to 6pm - Pager - [phone]  After 6pm go to [email or web address] - password [email or web address] [facility]  Office  [phone]  CC: Primary care physician; Jamie Norris, MD

## 2015-06-19 NOTE — Progress Notes (Signed)
[facility]  ROUNDING NOTE   Subjective:  Patient completed hemodialysis yesterday.  Due for EGD today. Resting comfortably at the moment.  Objective:  Vital signs in last 24 hours:  Temp:  [97.2 F (36.2 C)-98.4 F (36.9 C)] 97.2 F (36.2 C) (03/22 1230) Pulse Rate:  [70-91] 76 (03/22 1300) Resp:  [12-21] 13 (03/22 1300) BP: (124-174)/(56-72) 152/72 mmHg (03/22 1300) SpO2:  [97 %-100 %] 97 % (03/22 1300) Weight:  [63.2 kg (139 lb 5.3 oz)] 63.2 kg (139 lb 5.3 oz) (03/21 1500)  Weight change:  Filed Weights   06/15/15 1433 06/18/15 1115 06/18/15 1500  Weight: 55.339 kg (122 lb) 64.4 kg (141 lb 15.6 oz) 63.2 kg (139 lb 5.3 oz)    Intake/Output: I/O last 3 completed shifts: In: 600 [P.O.:600] Out: 1542 [Drains:25; [id number]   Intake/Output this shift:  Total I/O In: 100 [I.V.:100] Out: -   Physical Exam: General: NAD, resting in bed  Head: Normocephalic, atraumatic. Moist oral mucosal membranes  Eyes: Anicteric  Neck: Supple, trachea midline  Lungs:  Clear to auscultation normal effort  Heart: S1S2 no rubs  Abdomen:  Soft, nontender, BS present  Extremities: Trace LE edema.  Neurologic: Nonfocal, moving all four extremities  Skin: No lesions  Access: R IJ PC    Basic Metabolic Panel:  Recent Labs Lab 06/15/15 1428 06/16/15 0544 06/17/15 0531 06/18/15 0637 06/18/15 1531  NA 135 136 133*  --   --   K 3.0* 3.1* 3.4*  --   --   CL 98* 101 98*  --   --   CO2 30 25 25   --   --   GLUCOSE 90 71 76  --   --   BUN 25* 35* 53*  --   --   CREATININE 3.06* 3.69* 4.46*  --   --   CALCIUM 8.0* 8.2* 8.3*  --   --   MG  --   --   --  1.9  --   PHOS  --   --   --   --  2.2*    Liver Function Tests:  Recent Labs Lab 06/15/15 1428  AST 31  ALT 14  ALKPHOS 78  BILITOT 0.6  PROT 5.7*  ALBUMIN 2.3*   No results for input(s): LIPASE, AMYLASE in the last 168 hours. No results for input(s): AMMONIA in the last 168 hours.  CBC:  Recent  Labs Lab 06/15/15 1428 06/16/15 0544 06/16/15 1932 06/17/15 0531 06/18/15 0637  WBC 4.9 4.2  --  5.2 5.0  HGB 6.8* 6.5* 8.9* 7.6* 7.4*  HCT 20.0* 18.9* 26.2* 21.9* 21.4*  MCV 91.6 91.0  --  84.7 87.1  PLT 123* 78*  --  80* 97*    Cardiac Enzymes:  Recent Labs Lab 06/15/15 1428 06/18/15 2144  CKTOTAL  --  25*  TROPONINI <0.03  --     BNP: Invalid input(s): POCBNP  CBG: No results for input(s): GLUCAP in the last 168 hours.  Microbiology: Results for orders placed or performed during the hospital encounter of 05/20/15  Wound culture     Status: None   Collection Time: 05/20/15 12:25 PM  Result Value Ref Range Status   Specimen Description GROIN RIGHT  Final   Special Requests NONE  Final   Gram Stain   Final    FEW WBC SEEN FEW GRAM NEGATIVE RODS RARE GRAM POSITIVE COCCI    Culture   Final    RARE PSEUDOMONAS AERUGINOSA LIGHT GROWTH  VANCOMYCIN RESISTANT ENTEROCOCCUS    Report Status 05/25/2015 FINAL  Final   Organism ID, Bacteria PSEUDOMONAS AERUGINOSA  Final   Organism ID, Bacteria VANCOMYCIN RESISTANT ENTEROCOCCUS  Final      Susceptibility   Pseudomonas aeruginosa - MIC*    CEFTAZIDIME 4 SENSITIVE Sensitive     CIPROFLOXACIN 1 SENSITIVE Sensitive     GENTAMICIN <=1 SENSITIVE Sensitive     IMIPENEM 1 SENSITIVE Sensitive     CEFEPIME 4 SENSITIVE Sensitive     * RARE PSEUDOMONAS AERUGINOSA   Vancomycin resistant enterococcus - MIC*    AMPICILLIN Value in next row Resistant      RESISTANT>=32    VANCOMYCIN Value in next row Resistant      RESISTANT>=32    GENTAMICIN SYNERGY Value in next row Sensitive      RESISTANT>=32    LINEZOLID Value in next row Sensitive      SENSITIVE2    * LIGHT GROWTH VANCOMYCIN RESISTANT ENTEROCOCCUS  MRSA PCR Screening     Status: None   Collection Time: 05/20/15 12:30 PM  Result Value Ref Range Status   MRSA by PCR NEGATIVE NEGATIVE Final    Comment:        The GeneXpert MRSA Assay (FDA approved for NASAL  specimens only), is one component of a comprehensive MRSA colonization surveillance program. It is not intended to diagnose MRSA infection nor to guide or monitor treatment for MRSA infections.   Anaerobic culture     Status: None   Collection Time: 05/24/15  8:31 PM  Result Value Ref Range Status   Specimen Description GROIN RIGHT  Final   Special Requests NONE  Final   Culture NO ANAEROBES ISOLATED  Final   Report Status 05/28/2015 FINAL  Final  Wound culture     Status: None   Collection Time: 05/24/15  9:13 PM  Result Value Ref Range Status   Specimen Description GROIN RIGHT  Final   Special Requests NONE  Final   Gram Stain FEW WBC SEEN MANY GRAM POSITIVE COCCI IN PAIRS   Final   Culture HEAVY GROWTH VANCOMYCIN RESISTANT ENTEROCOCCUS  Final   Report Status 05/27/2015 FINAL  Final   Organism ID, Bacteria VANCOMYCIN RESISTANT ENTEROCOCCUS  Final      Susceptibility   Vancomycin resistant enterococcus - MIC*    AMPICILLIN >=32 RESISTANT Resistant     VANCOMYCIN >=32 RESISTANT Resistant     GENTAMICIN SYNERGY SENSITIVE Sensitive     LINEZOLID 2 SENSITIVE Sensitive     * HEAVY GROWTH VANCOMYCIN RESISTANT ENTEROCOCCUS  Body fluid culture     Status: None   Collection Time: 05/31/15 10:57 AM  Result Value Ref Range Status   Specimen Description CYTOPLEU  Final   Special Requests NONE  Final   Gram Stain   Final    MODERATE RED BLOOD CELLS FEW WBC SEEN NO ORGANISMS SEEN    Culture No growth aerobically or anaerobically.  Final   Report Status 06/06/2015 FINAL  Final    Coagulation Studies: No results for input(s): LABPROT, INR in the last 72 hours.  Urinalysis: No results for input(s): COLORURINE, LABSPEC, PHURINE, GLUCOSEU, HGBUR, BILIRUBINUR, KETONESUR, PROTEINUR, UROBILINOGEN, NITRITE, LEUKOCYTESUR in the last 72 hours.  Invalid input(s): APPERANCEUR    Imaging: No results found.   Medications:   . sodium chloride 20 mL/hr at 06/19/15 1050   . [MAR  Hold] acetaminophen  1,000 mg Oral 3 times per day  . [MAR Hold] amLODipine  5 mg Oral  Daily  . [MAR Hold] ceftAZIDime (FORTAZ) IVPB 1 gram/50 mL D5W (Pyxis)  1 g Intravenous Q T,Th,Sa-HD  . [START ON 06/20/2015] DAPTOmycin (CUBICIN)  IV  6 mg/kg Intravenous Q48H  . [MAR Hold] docusate sodium  100 mg Oral BID  . [MAR Hold] famotidine  20 mg Oral Daily  . [MAR Hold] fluticasone  2 spray Each Nare Daily  . [MAR Hold] fluticasone furoate-vilanterol  1 puff Inhalation Daily  . [MAR Hold] hydrALAZINE  10 mg Oral 3 times per day  . [MAR Hold] Influenza vac split quadrivalent PF  0.5 mL Intramuscular Tomorrow-1000  . [START ON 06/20/2015] levothyroxine  88 mcg Oral QAC breakfast  . [MAR Hold] metoprolol  25 mg Oral 3 times per day  . [MAR Hold] multivitamin  1 tablet Oral Daily  . [MAR Hold] pantoprazole (PROTONIX) IV  40 mg Intravenous Q12H  . [MAR Hold] sodium chloride  1 spray Each Nare 5 X Daily  . [MAR Hold] sodium chloride flush  3 mL Intravenous Q12H   [MAR Hold] sodium chloride, sodium chloride, sodium chloride, [MAR Hold] acetaminophen **OR** [MAR Hold] acetaminophen, [MAR Hold] albuterol, [MAR Hold] ALPRAZolam, alteplase, [MAR Hold] alum & mag hydroxide-simeth, anticoagulant sodium citrate, [MAR Hold] HYDROcodone-acetaminophen, lidocaine (PF), lidocaine-prilocaine, [MAR Hold] loperamide, [MAR Hold] magnesium hydroxide, [MAR Hold] ondansetron **OR** [MAR Hold] ondansetron (ZOFRAN) IV, [MAR Hold] ondansetron, pentafluoroprop-tetrafluoroeth, [MAR Hold] promethazine, [MAR Hold] senna-docusate, [MAR Hold] sodium chloride flush  Assessment/ Plan:  65 y.o. female with COPD, hypertension, hyperlipidemia, vaginal intraepithelial neoplasia, emergent AAA repair status post rupture on November 18, left to right femoral to femoral bypass , acute renal failure leading to permanent dialysis, significant 3 vessel disease, HIT positive, thrombocytopenia.  1.  ESRD on HD:  Has had hypotension with HD recently.  Anemia and decreased circulating volume may be playing a role.   -no acute indication for dialysis.  Dr. Kayleen Memos for dialysis tomorrow.  2.  Anemia of CKD/suspected blood loss:   -  Patient transfused 2 units PRBCs this admission. -  EGD being performed today.  3.  SHPTH:  Continue to periodically monitor bone mineral metabolism parameters.  4.  Thrombocytopenia:  HIT positive, pack catheter with citrate while here.      LOS: 4 Elam Ellis 3/22/20171:21 PM

## 2015-06-19 NOTE — Progress Notes (Signed)
Pharmacy Antibiotic Note  [name] is a 65 y.o. female with ESRD on HD admitted on [id number] with a complicated vascular history as detailed by Dr Ronalee Belts and Lucky Cowboy. She is currently dealing with a seroma in her R groin,and was admitted last month when this was draining purulent material and with surrounding redness, pain and induration .Culture from the R wound grew pseudomonas and VRE, she underwent I and D 2/24 by Dr Delana Meyer and was discharged 3/4 on IV ceftazidime at HD and PO linezolid  Pharmacy is consulted to dose daptomycin.   Plan: Daptomycin 6 mg/kg IV Q48H.  Baseline CK 25.   Recommend holding statin for duration of daptomycin and montoring CK at least weekly.  Height: 5\' 4"  (162.6 cm) Weight: 139 lb 5.3 oz (63.2 kg) IBW/kg (Calculated) : 54.7  Temp (24hrs), Avg:98.2 F (36.8 C), Min:98 F (36.7 C), Max:98.4 F (36.9 C)   Recent Labs Lab 06/15/15 1428 06/16/15 0544 06/17/15 0531 06/18/15 0637  WBC 4.9 4.2 5.2 5.0  CREATININE 3.06* 3.69* 4.46*  --     Estimated Creatinine Clearance: 10.7 mL/min (by C-G formula based on Cr of 4.46).    Allergies  Allergen Reactions  . Asa [Aspirin] Other (See Comments)    unknown  . 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 Plavix [Clopidogrel] Other (See Comments)    unknown    Antimicrobials this admission: 3/21 ceftazidime >>  3/21 daptomycin >>   Dose adjustments this admission:   Microbiology results:  BCx:   UCx:    Sputum:    MRSA PCR:   Thank you for allowing pharmacy to be a part of this patient's care.  Rocky Morel 06/19/2015 11:31 AM

## 2015-06-19 NOTE — Consult Note (Signed)
Patient with AVM in duodenum, treated and then clipped.  Follow hgb. Check CBC in morning, sooner if needed.

## 2015-06-19 NOTE — Anesthesia Preprocedure Evaluation (Signed)
Anesthesia Evaluation  Patient identified by MRN, date of birth, ID band Patient awake    Reviewed: Allergy & Precautions, NPO status , Patient's Chart, lab work & pertinent test results  History of Anesthesia Complications Negative for: history of anesthetic complications  Airway Mallampati: II  TM Distance: >3 FB Neck ROM: Full    Dental  (+) Teeth Intact, Poor Dentition   Pulmonary shortness of breath and at rest, neg sleep apnea, pneumonia, unresolved, COPD,  COPD inhaler and oxygen dependent, neg recent URI, former smoker,     + decreased breath sounds  rales    Cardiovascular Exercise Tolerance: Poor hypertension, Pt. on medications and Pt. on home beta blockers (-) angina+ CAD (Has 2 blockages that are untreated; is supposed to follow up with [facility] for bypass surgery, but has not been healthy enough to do so), + Peripheral Vascular Disease and +CHF  (-) Past MI, (-) Cardiac Stents and (-) CABG (-) dysrhythmias (-) Valvular Problems/Murmurs Rhythm:Regular Rate:Normal     Neuro/Psych negative neurological ROS  negative psych ROS   GI/Hepatic Neg liver ROS, GERD  Medicated and Controlled,  Endo/Other  neg diabetesHypothyroidism   Renal/GU Dialysis and ESRFRenal disease     Musculoskeletal   Abdominal (+)  Abdomen: soft.    Peds  Hematology  (+) anemia , Hb 9.5, plts 69.   Anesthesia Other Findings Past Medical History:   Hyperlipidemia                                               Hypertension                                                 Thyroid disease                                              GERD (gastroesophageal reflux disease)                       Aneurysm ([place])                                                 Comment:abdominal aortic   STD (sexually transmitted disease)                             Comment:chlamydia   VAIN II (vaginal intraepithelial neoplasia gra* 7/09         Granuloma annulare                               2010           Comment:skin- sees dermatologist   B12 deficiency  COPD (chronic obstructive pulmonary disease) (*              Renal insufficiency                                          Cancer (HCC)                                                   Comment:skin on left ankle 04/10/15 pt states she has               never had cancer  We discussed the risks of her cardiac disease in regards to this surgery given that it has been recommended that she should have bypass surgery at [facility]  She has not been healthy enough to undergo this procedure and will need to have the groin infections cleaned out prior to that surgery as well as resolution of her current pneumonia.  She expressed understanding of the risks and is willing to proceed.  Will attempt to avoid intubation as it may be difficult to extubate the patient post-op given her current respiratory status.  Reproductive/Obstetrics negative OB ROS                             Anesthesia Physical  Anesthesia Plan  ASA: IV  Anesthesia Plan: General   Post-op Pain Management:    Induction: Intravenous  Airway Management Planned: Oral ETT  Additional Equipment: Arterial line  Intra-op Plan:   Post-operative Plan: Extubation in OR  Informed Consent: I have reviewed the patients History and Physical, chart, labs and discussed the procedure including the risks, benefits and alternatives for the proposed anesthesia with the patient or authorized representative who has indicated his/her understanding and acceptance.   Dental advisory given  Plan Discussed with: CRNA, Surgeon and Anesthesiologist  Anesthesia Plan Comments: (Low Hb and pltlts--on oxygen. Hope to be able to extubate at the end of the case. Troponins have been elevated.)        Anesthesia Quick Evaluation

## 2015-06-19 NOTE — Plan of Care (Signed)
Problem: Safety: Goal: Ability to remain free from injury will improve Outcome: Progressing Pt has remained free from falls during my care and calls appropriately when needing assistance.  Problem: Pain Managment: Goal: General experience of comfort will improve Outcome: Progressing Pt has remained free from pain during my care.

## 2015-06-20 LAB — RENAL FUNCTION PANEL
ALBUMIN: 2.2 g/dL — AB (ref 3.5–5.0)
Anion gap: 10 (ref 5–15)
BUN: 40 mg/dL — AB (ref 6–20)
CALCIUM: 8.4 mg/dL — AB (ref 8.9–10.3)
CHLORIDE: 98 mmol/L — AB (ref 101–111)
CO2: 26 mmol/L (ref 22–32)
CREATININE: 3.31 mg/dL — AB (ref 0.44–1.00)
GFR, EST AFRICAN AMERICAN: 16 mL/min — AB (ref >60)
GFR, EST NON AFRICAN AMERICAN: 14 mL/min — AB (ref >60)
Glucose, Bld: 65 mg/dL (ref 65–99)
Phosphorus: 4.6 mg/dL (ref 2.5–4.6)
Potassium: 3.1 mmol/L — ABNORMAL LOW (ref 3.5–5.1)
Sodium: 134 mmol/L — ABNORMAL LOW (ref 135–145)

## 2015-06-20 LAB — CBC
HCT: 20.7 % — ABNORMAL LOW (ref 35.0–47.0)
Hemoglobin: 7 g/dL — ABNORMAL LOW (ref 12.0–16.0)
MCH: 29.5 pg (ref 26.0–34.0)
MCHC: 33.7 g/dL (ref 32.0–36.0)
MCV: 87.5 fL (ref 80.0–100.0)
PLATELETS: 60 10*3/uL — AB (ref 150–440)
RBC: 2.36 MIL/uL — AB (ref 3.80–5.20)
RDW: 18.5 % — AB (ref 11.5–14.5)
WBC: 3.7 10*3/uL (ref 3.6–11.0)

## 2015-06-20 MED ORDER — POTASSIUM CHLORIDE CRYS ER 20 MEQ PO TBCR
30.0000 meq | EXTENDED_RELEASE_TABLET | Freq: Once | ORAL | Status: AC
  Administered 2015-06-20: 30 meq via ORAL
  Filled 2015-06-20: qty 1

## 2015-06-20 MED ORDER — HYDRALAZINE HCL 25 MG PO TABS
25.0000 mg | ORAL_TABLET | Freq: Three times a day (TID) | ORAL | Status: DC
  Administered 2015-06-20 – 2015-06-24 (×12): 25 mg via ORAL
  Filled 2015-06-20 (×12): qty 1

## 2015-06-20 NOTE — Anesthesia Postprocedure Evaluation (Signed)
Anesthesia Post Note  Patient: [name]  Procedure(s) Performed: Procedure(s) (LRB): ESOPHAGOGASTRODUODENOSCOPY (EGD) (N/A)  Patient location during evaluation: PACU Anesthesia Type: General Level of consciousness: awake and alert Pain management: pain level controlled Vital Signs Assessment: post-procedure vital signs reviewed and stable Respiratory status: spontaneous breathing, nonlabored ventilation, respiratory function stable and patient connected to nasal cannula oxygen Cardiovascular status: blood pressure returned to baseline and stable Postop Assessment: no signs of nausea or vomiting Anesthetic complications: no    Last Vitals:  Filed Vitals:   06/20/15 0500 06/20/15 1213  BP: 154/62 154/60  Pulse: 70 73  Temp: 36.5 C 36.6 C  Resp: 16 15    Last Pain:  Filed Vitals:   06/20/15 1213  PainSc: 3                  Martha Clan

## 2015-06-20 NOTE — Progress Notes (Signed)
[name] NAME: [name]    MR#:  [id number]  DATE OF BIRTH:  07-Sep-1950  SUBJECTIVE;seen at bedside.pt had EGD and AVM is clipped.she denies  Any complaints.  CHIEF COMPLAINT:   Chief Complaint  Patient presents with  . Hypotension  . Chest Pain   - admitted with hypotension during dialysis - asymptomatic now, feels extremely weak. Low hb inspite of 1 unit transfusion yesterday. - 1 more unit ordered today. From [facility]  REVIEW OF SYSTEMS:  Review of Systems  Constitutional: Negative for fever, chills and malaise/fatigue.  HENT: Negative for ear discharge, ear pain and nosebleeds.   Eyes: Negative for blurred vision and double vision.  Respiratory: Negative for cough, shortness of breath and wheezing.   Cardiovascular: Negative for chest pain, palpitations and leg swelling.  Gastrointestinal: Negative for nausea, vomiting, abdominal pain, diarrhea and constipation.  Genitourinary: Negative for dysuria and urgency.  Musculoskeletal: Negative for myalgias and back pain.  Neurological: Negative for dizziness, sensory change, speech change, focal weakness, seizures and headaches.  Psychiatric/Behavioral: Negative for depression.    DRUG ALLERGIES:   Allergies  Allergen Reactions  . Asa [Aspirin] Other (See Comments)    unknown  . 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 Plavix [Clopidogrel] Other (See Comments)    unknown    VITALS:  Blood pressure 155/66, pulse 74, temperature 97.7 F (36.5 C), temperature source Oral, resp. rate 16, height 5\' 4"  (1.626 m), weight 62.2 kg (137 lb 2 oz), last menstrual period 03/30/1988, SpO2 100 %.  PHYSICAL EXAMINATION:  Physical Exam  GENERAL:  65 y.o.-year-old patient lying in the bed with no acute distress. Pale appearing EYES: Pupils equal, round, reactive to light and accommodation. No scleral icterus. Extraocular muscles  intact.  HEENT: Head atraumatic, normocephalic. Oropharynx and nasopharynx clear.  NECK:  Supple, no jugular venous distention. No thyroid enlargement, no tenderness.  LUNGS: Normal breath sounds bilaterally, no wheezing, rales,rhonchi or crepitation. No use of accessory muscles of respiration. Decreased bibasilar breath sounds CARDIOVASCULAR: S1, S2 normal. No murmurs, rubs, or gallops.  ABDOMEN: Soft, nontender, nondistended. Bowel sounds present. No organomegaly or mass.  EXTREMITIES: No pedal edema, cyanosis, or clubbing. Right groin wound vac in place, no evidence of infection, erythema otherwise. NEUROLOGIC: Cranial nerves II through XII are intact. Muscle strength 5/5 in all extremities. Sensation intact. Gait not checked.  PSYCHIATRIC: The patient is alert and oriented x 3.  SKIN: right groin wound is  Granulating.Marland Kitchen   LABORATORY PANEL:   CBC  Recent Labs Lab 06/20/15 0403  WBC 3.7  HGB 7.0*  HCT 20.7*  PLT 60*   ------------------------------------------------------------------------------------------------------------------  Chemistries   Recent Labs Lab 06/15/15 1428  06/18/15 0637 06/20/15 0403  NA 135  < >  --  134*  K 3.0*  < >  --  3.1*  CL 98*  < >  --  98*  CO2 30  < >  --  26  GLUCOSE 90  < >  --  65  BUN 25*  < >  --  40*  CREATININE 3.06*  < >  --  3.31*  CALCIUM 8.0*  < >  --  8.4*  MG  --   --  1.9  --   AST 31  --   --   --   ALT 14  --   --   --   [facility]  78  --   --   --   BILITOT 0.6  --   --   --   < > = values in this interval not displayed. ------------------------------------------------------------------------------------------------------------------  Cardiac Enzymes  Recent Labs Lab 06/15/15 1428  TROPONINI <0.03   ------------------------------------------------------------------------------------------------------------------  RADIOLOGY:  No results found.  EKG:   Orders placed or performed during the hospital encounter  of 06/15/15  . ED EKG  . ED EKG  . EKG 12-Lead  . EKG 12-Lead    ASSESSMENT AND PLAN:   Extensive 65 year old female with past medical history significant for ESRD on Tuesday Thursday Saturday hemodialysis, recent pseudomonal right groin seroma infection status post wound VAC placement, hypertension and history of pneumonia and diastolic CHF exacerbation presents to the hospital from [facility] secondary to hypotension during dialysis  #1 hypotension- likely hypovolemic and also from anemia - Improved.    2 .Acute on chronic anemia- anemia of chronic disease. Recent adm requiring transfusions .this visit    S/[p 2 unts of  RBC transfusion. Hemoglobin stable at 7.4. Because of guaiac-positive stools and anemia S/p EGD;showing AVM,clipped.  .#3right groin infected seroma with Pseudomonas-continue Fortaz with dialysis, started on daptomycin by ID.needs Daptomycin at SNF/zyvox stopped due to bone marrow toxicity  That she experienced after starting it. Wound vac changed today.,has significant improvement with granulation tissue,  #4 end-stage renal disease with hemodialysis-on Tuesday Thursday Saturday schedule. Nephrology has been consulted.   #5 hypertension- resolved hypotension. Resumed Norvasc.restart hydralazine.  . #6 DVT prophylaxis-Ted's and SCDs. No heparin products due to anemia.   All the records are reviewed and case discussed with Care Management/Social Workerr. Management plans discussed with the patient, family and they are in agreement.  CODE STATUS: Full Code  TOTAL TIME TAKING CARE OF THIS PATIENT: 25 minutes.   POSSIBLE D/C IN 2 DAYS, DEPENDING ON CLINICAL CONDITION.   Epifanio Lesches M.D on 06/20/2015 at 3:32 PM  Between 7am to 6pm - Pager - [phone]  After 6pm go to [email or web address] - password [email or web address] [facility] [facility]  Office  [phone]  CC: Primary care physician; Arnette Norris, MD
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
## 2015-06-20 NOTE — Progress Notes (Signed)
Dr. Jannifer Franklin notified for request to take pt. Off telemetry; NSR X2 days; new order written. Barbaraann Faster, RN 12:44 AM 06/20/2015

## 2015-06-20 NOTE — Progress Notes (Signed)
Post hd tx 

## 2015-06-20 NOTE — Consult Note (Signed)
WOC wound follow up Wound type:Recent debridement surgical wound to right inguinal fold.  Measurement:4 cm x 3 cm x 1 cm (impressive granulation has developed) Wound [id number], granulating.  Clear serous drainage.  Drainage (amount, consistency, odor) Clear, serous.  No odor.  Periwound:Intact Dressing procedure/placement/frequency:Dressing change per MD order.  Plans to discharge to rehab tomorrow.  Wound bed is granulating and improving. This is shown to patient and spouse.  Will not follow at this time.  Please re-consult if needed.  Domenic Moras RN BSN [place] Pager [phone]

## 2015-06-20 NOTE — Progress Notes (Signed)
Pre-hd tx 

## 2015-06-20 NOTE — Progress Notes (Signed)
Hemodialysis start 

## 2015-06-20 NOTE — Op Note (Addendum)
[facility] Patient Name: [name] Procedure Date: 06/19/2015 11:54 AM MRN: [id number] Account #: [id number] Date of Birth: 03-20-1951 Admit Type: Inpatient Age: 65 Room: [facility] ENDO ROOM 3 Gender: Female Note Status: Supervisor Override THIS EXAM WAS SENT IN ERROR

## 2015-06-20 NOTE — Progress Notes (Signed)
Hemodialysis completed. 

## 2015-06-20 NOTE — Consult Note (Signed)
Patient in dialysis, hgb 7.  No plans for colonoscopy at this time. Want area in small bowel to heal up first.   Would consider transfusion if  below 7.

## 2015-06-20 NOTE — Progress Notes (Signed)
[facility]  ROUNDING NOTE   Subjective:  Patient had EGD yesterday. She was found have AVM in the duodenum which was clipped. Hemoglobin currently 7.0. Objective:  Vital signs in last 24 hours:  Temp:  [97.2 F (36.2 C)-97.8 F (36.6 C)] 97.7 F (36.5 C) (03/23 0500) Pulse Rate:  [70-80] 70 (03/23 0500) Resp:  [13-21] 16 (03/23 0500) BP: (151-162)/(59-72) 154/62 mmHg (03/23 0500) SpO2:  [97 %-100 %] 100 % (03/23 0500)  Weight change:  Filed Weights   06/15/15 1433 06/18/15 1115 06/18/15 1500  Weight: 55.339 kg (122 lb) 64.4 kg (141 lb 15.6 oz) 63.2 kg (139 lb 5.3 oz)    Intake/Output: I/O last 3 completed shifts: In: [id number] [P.O.:740; I.V.:103] Out: 100 [Drains:100]   Intake/Output this shift:     Physical Exam: General: NAD, resting in bed  Head: Normocephalic, atraumatic. Moist oral mucosal membranes  Eyes: Anicteric  Neck: Supple, trachea midline  Lungs:  Clear to auscultation normal effort  Heart: S1S2 no rubs  Abdomen:  Soft, nontender, BS present  Extremities: Trace LE edema.  Neurologic: Nonfocal, moving all four extremities  Skin: No lesions  Access: R IJ PC    Basic Metabolic Panel:  Recent Labs Lab 06/15/15 1428 06/16/15 0544 06/17/15 0531 06/18/15 0637 06/18/15 1531  NA 135 136 133*  --   --   K 3.0* 3.1* 3.4*  --   --   CL 98* 101 98*  --   --   CO2 30 25 25   --   --   GLUCOSE 90 71 76  --   --   BUN 25* 35* 53*  --   --   CREATININE 3.06* 3.69* 4.46*  --   --   CALCIUM 8.0* 8.2* 8.3*  --   --   MG  --   --   --  1.9  --   PHOS  --   --   --   --  2.2*    Liver Function Tests:  Recent Labs Lab 06/15/15 1428  AST 31  ALT 14  ALKPHOS 78  BILITOT 0.6  PROT 5.7*  ALBUMIN 2.3*   No results for input(s): LIPASE, AMYLASE in the last 168 hours. No results for input(s): AMMONIA in the last 168 hours.  CBC:  Recent Labs Lab 06/15/15 1428 06/16/15 0544 06/16/15 1932 06/17/15 0531 06/18/15 0637 06/20/15 0403  WBC  4.9 4.2  --  5.2 5.0 3.7  HGB 6.8* 6.5* 8.9* 7.6* 7.4* 7.0*  HCT 20.0* 18.9* 26.2* 21.9* 21.4* 20.7*  MCV 91.6 91.0  --  84.7 87.1 87.5  PLT 123* 78*  --  80* 97* 60*    Cardiac Enzymes:  Recent Labs Lab 06/15/15 1428 06/18/15 2144  CKTOTAL  --  25*  TROPONINI <0.03  --     BNP: Invalid input(s): POCBNP  CBG: No results for input(s): GLUCAP in the last 168 hours.  Microbiology: Results for orders placed or performed during the hospital encounter of 05/20/15  Wound culture     Status: None   Collection Time: 05/20/15 12:25 PM  Result Value Ref Range Status   Specimen Description GROIN RIGHT  Final   Special Requests NONE  Final   Gram Stain   Final    FEW WBC SEEN FEW GRAM NEGATIVE RODS RARE GRAM POSITIVE COCCI    Culture   Final    RARE PSEUDOMONAS AERUGINOSA LIGHT GROWTH VANCOMYCIN RESISTANT ENTEROCOCCUS    Report Status 05/25/2015 FINAL  Final  Organism ID, Bacteria PSEUDOMONAS AERUGINOSA  Final   Organism ID, Bacteria VANCOMYCIN RESISTANT ENTEROCOCCUS  Final      Susceptibility   Pseudomonas aeruginosa - MIC*    CEFTAZIDIME 4 SENSITIVE Sensitive     CIPROFLOXACIN 1 SENSITIVE Sensitive     GENTAMICIN <=1 SENSITIVE Sensitive     IMIPENEM 1 SENSITIVE Sensitive     CEFEPIME 4 SENSITIVE Sensitive     * RARE PSEUDOMONAS AERUGINOSA   Vancomycin resistant enterococcus - MIC*    AMPICILLIN Value in next row Resistant      RESISTANT>=32    VANCOMYCIN Value in next row Resistant      RESISTANT>=32    GENTAMICIN SYNERGY Value in next row Sensitive      RESISTANT>=32    LINEZOLID Value in next row Sensitive      SENSITIVE2    * LIGHT GROWTH VANCOMYCIN RESISTANT ENTEROCOCCUS  MRSA PCR Screening     Status: None   Collection Time: 05/20/15 12:30 PM  Result Value Ref Range Status   MRSA by PCR NEGATIVE NEGATIVE Final    Comment:        The GeneXpert MRSA Assay (FDA approved for NASAL specimens only), is one component of a comprehensive MRSA  colonization surveillance program. It is not intended to diagnose MRSA infection nor to guide or monitor treatment for MRSA infections.   Anaerobic culture     Status: None   Collection Time: 05/24/15  8:31 PM  Result Value Ref Range Status   Specimen Description GROIN RIGHT  Final   Special Requests NONE  Final   Culture NO ANAEROBES ISOLATED  Final   Report Status 05/28/2015 FINAL  Final  Wound culture     Status: None   Collection Time: 05/24/15  9:13 PM  Result Value Ref Range Status   Specimen Description GROIN RIGHT  Final   Special Requests NONE  Final   Gram Stain FEW WBC SEEN MANY GRAM POSITIVE COCCI IN PAIRS   Final   Culture HEAVY GROWTH VANCOMYCIN RESISTANT ENTEROCOCCUS  Final   Report Status 05/27/2015 FINAL  Final   Organism ID, Bacteria VANCOMYCIN RESISTANT ENTEROCOCCUS  Final      Susceptibility   Vancomycin resistant enterococcus - MIC*    AMPICILLIN >=32 RESISTANT Resistant     VANCOMYCIN >=32 RESISTANT Resistant     GENTAMICIN SYNERGY SENSITIVE Sensitive     LINEZOLID 2 SENSITIVE Sensitive     * HEAVY GROWTH VANCOMYCIN RESISTANT ENTEROCOCCUS  Body fluid culture     Status: None   Collection Time: 05/31/15 10:57 AM  Result Value Ref Range Status   Specimen Description CYTOPLEU  Final   Special Requests NONE  Final   Gram Stain   Final    MODERATE RED BLOOD CELLS FEW WBC SEEN NO ORGANISMS SEEN    Culture No growth aerobically or anaerobically.  Final   Report Status 06/06/2015 FINAL  Final    Coagulation Studies: No results for input(s): LABPROT, INR in the last 72 hours.  Urinalysis: No results for input(s): COLORURINE, LABSPEC, PHURINE, GLUCOSEU, HGBUR, BILIRUBINUR, KETONESUR, PROTEINUR, UROBILINOGEN, NITRITE, LEUKOCYTESUR in the last 72 hours.  Invalid input(s): APPERANCEUR    Imaging: No results found.   Medications:     . amLODipine  5 mg Oral Daily  . ceftAZIDime (FORTAZ) IVPB 1 gram/50 mL D5W (Pyxis)  1 g Intravenous Q T,Th,Sa-HD   . DAPTOmycin (CUBICIN)  IV  6 mg/kg Intravenous Q48H  . docusate sodium  100 mg Oral BID  .  famotidine  20 mg Oral Daily  . fluticasone  2 spray Each Nare Daily  . fluticasone furoate-vilanterol  1 puff Inhalation Daily  . hydrALAZINE  10 mg Oral 3 times per day  . Influenza vac split quadrivalent PF  0.5 mL Intramuscular Tomorrow-1000  . levothyroxine  88 mcg Oral QAC breakfast  . metoprolol  25 mg Oral 3 times per day  . multivitamin  1 tablet Oral Daily  . pantoprazole (PROTONIX) IV  40 mg Intravenous Q12H  . sodium chloride  1 spray Each Nare 5 X Daily  . sodium chloride flush  3 mL Intravenous Q12H   sodium chloride, sodium chloride, sodium chloride, acetaminophen **OR** acetaminophen, albuterol, ALPRAZolam, alteplase, alum & mag hydroxide-simeth, anticoagulant sodium citrate, HYDROcodone-acetaminophen, lidocaine (PF), lidocaine-prilocaine, loperamide, magnesium hydroxide, ondansetron **OR** ondansetron (ZOFRAN) IV, ondansetron, pentafluoroprop-tetrafluoroeth, promethazine, senna-docusate, sodium chloride flush  Assessment/ Plan:  65 y.o. female with COPD, hypertension, hyperlipidemia, vaginal intraepithelial neoplasia, emergent AAA repair status post rupture on November 18, left to right femoral to femoral bypass , acute renal failure leading to permanent dialysis, significant 3 vessel disease, HIT positive, thrombocytopenia.  1.  ESRD on HD:  Has had hypotension with HD recently. Anemia and decreased circulating volume may be playing a role.   -Patient due for hemodialysis today. We will prepare orders for hemodialysis with ultrafiltration target of 1kg.  2.  Anemia of CKD/suspected blood loss:   -  Patient transfused 2 units PRBCs this admission. -  EGD being performed and revealed duodenal AVM.  3.  SHPTH:  Phosphorus 2.2 at last check. No indication for binders at the moment.  4.  Thrombocytopenia:  HIT positive, pack catheter with citrate while here.      LOS: 5 Jyra Lagares,  Paylin Hailu 3/23/20178:38 AM

## 2015-06-21 LAB — HEMOGLOBIN AND HEMATOCRIT, BLOOD
HEMATOCRIT: 22.2 % — AB (ref 35.0–47.0)
HEMATOCRIT: 25.4 % — AB (ref 35.0–47.0)
HEMOGLOBIN: 7.5 g/dL — AB (ref 12.0–16.0)
HEMOGLOBIN: 8.6 g/dL — AB (ref 12.0–16.0)

## 2015-06-21 LAB — PREPARE RBC (CROSSMATCH)

## 2015-06-21 MED ORDER — SODIUM CHLORIDE 0.9 % IV SOLN
Freq: Once | INTRAVENOUS | Status: AC
  Administered 2015-06-21: 15:00:00 via INTRAVENOUS

## 2015-06-21 MED ORDER — POTASSIUM CHLORIDE 20 MEQ PO PACK
40.0000 meq | PACK | Freq: Once | ORAL | Status: AC
  Administered 2015-06-21: 40 meq via ORAL
  Filled 2015-06-21: qty 2

## 2015-06-21 MED ORDER — PANTOPRAZOLE SODIUM 40 MG PO TBEC
40.0000 mg | DELAYED_RELEASE_TABLET | Freq: Two times a day (BID) | ORAL | Status: DC
  Administered 2015-06-21 – 2015-06-24 (×6): 40 mg via ORAL
  Filled 2015-06-21 (×6): qty 1

## 2015-06-21 NOTE — Consult Note (Signed)
Patient hgb up to 7.5, she is supposed to be discharged tomorrow if all goes well.  GI will not see her this weekend unless she has a set back and then call GI doctor on call.  Dr. Drema Dallas from [facility] will be on call this weekend.

## 2015-06-21 NOTE — Clinical Social Work Note (Signed)
CSW has updated [facility] regarding patient. Patient will discharge back to [facility] when ready and will resume her wound vac there. Shela Leff MSW,LCSW [phone]

## 2015-06-21 NOTE — Progress Notes (Signed)
[facility]  ROUNDING NOTE   Subjective:  Hemoglobin up to 7.5 today. She will need daptomycin for 2 weeks as an outpatient. Due for dialysis again tomorrow.    Objective:  Vital signs in last 24 hours:  Temp:  [97.9 F (36.6 C)-98.7 F (37.1 C)] 97.9 F (36.6 C) (03/24 1257) Pulse Rate:  [68-90] 72 (03/24 1257) Resp:  [10-20] 20 (03/24 1257) BP: (136-160)/(54-69) 147/62 mmHg (03/24 1257) SpO2:  [93 %-100 %] 98 % (03/24 1257) Weight:  [61.1 kg (134 lb 11.2 oz)] 61.1 kg (134 lb 11.2 oz) (03/23 1828)  Weight change:  Filed Weights   06/18/15 1500 06/20/15 1505 06/20/15 1828  Weight: 63.2 kg (139 lb 5.3 oz) 62.2 kg (137 lb 2 oz) 61.1 kg (134 lb 11.2 oz)    Intake/Output: I/O last 3 completed shifts: In: 235 [P.O.:120; I.V.:6; IV Piggyback:109] Out: 1050 [Drains:50; Other:1000]   Intake/Output this shift:     Physical Exam: General: NAD, resting in bed  Head: Normocephalic, atraumatic. Moist oral mucosal membranes  Eyes: Anicteric  Neck: Supple, trachea midline  Lungs:  Clear to auscultation normal effort  Heart: S1S2 no rubs  Abdomen:  Soft, nontender, BS present  Extremities: Trace LE edema.  Neurologic: Nonfocal, moving all four extremities  Skin: No lesions  Access: R IJ PC    Basic Metabolic Panel:  Recent Labs Lab 06/15/15 1428 06/16/15 0544 06/17/15 0531 06/18/15 0637 06/18/15 1531 06/20/15 0403  NA 135 136 133*  --   --  134*  K 3.0* 3.1* 3.4*  --   --  3.1*  CL 98* 101 98*  --   --  98*  CO2 30 25 25   --   --  26  GLUCOSE 90 71 76  --   --  65  BUN 25* 35* 53*  --   --  40*  CREATININE 3.06* 3.69* 4.46*  --   --  3.31*  CALCIUM 8.0* 8.2* 8.3*  --   --  8.4*  MG  --   --   --  1.9  --   --   PHOS  --   --   --   --  2.2* 4.6    Liver Function Tests:  Recent Labs Lab 06/15/15 1428 06/20/15 0403  AST 31  --   ALT 14  --   ALKPHOS 78  --   BILITOT 0.6  --   PROT 5.7*  --   ALBUMIN 2.3* 2.2*   No results for input(s):  LIPASE, AMYLASE in the last 168 hours. No results for input(s): AMMONIA in the last 168 hours.  CBC:  Recent Labs Lab 06/15/15 1428 06/16/15 0544 06/16/15 1932 06/17/15 0531 06/18/15 0637 06/20/15 0403 06/21/15 1400  WBC 4.9 4.2  --  5.2 5.0 3.7  --   HGB 6.8* 6.5* 8.9* 7.6* 7.4* 7.0* 7.5*  HCT 20.0* 18.9* 26.2* 21.9* 21.4* 20.7* 22.2*  MCV 91.6 91.0  --  84.7 87.1 87.5  --   PLT 123* 78*  --  80* 97* 60*  --     Cardiac Enzymes:  Recent Labs Lab 06/15/15 1428 06/18/15 2144  CKTOTAL  --  25*  TROPONINI <0.03  --     BNP: Invalid input(s): POCBNP  CBG: No results for input(s): GLUCAP in the last 168 hours.  Microbiology: Results for orders placed or performed during the hospital encounter of 05/20/15  Wound culture     Status: None   Collection Time: 05/20/15  12:25 PM  Result Value Ref Range Status   Specimen Description GROIN RIGHT  Final   Special Requests NONE  Final   Gram Stain   Final    FEW WBC SEEN FEW GRAM NEGATIVE RODS RARE GRAM POSITIVE COCCI    Culture   Final    RARE PSEUDOMONAS AERUGINOSA LIGHT GROWTH VANCOMYCIN RESISTANT ENTEROCOCCUS    Report Status 05/25/2015 FINAL  Final   Organism ID, Bacteria PSEUDOMONAS AERUGINOSA  Final   Organism ID, Bacteria VANCOMYCIN RESISTANT ENTEROCOCCUS  Final      Susceptibility   Pseudomonas aeruginosa - MIC*    CEFTAZIDIME 4 SENSITIVE Sensitive     CIPROFLOXACIN 1 SENSITIVE Sensitive     GENTAMICIN <=1 SENSITIVE Sensitive     IMIPENEM 1 SENSITIVE Sensitive     CEFEPIME 4 SENSITIVE Sensitive     * RARE PSEUDOMONAS AERUGINOSA   Vancomycin resistant enterococcus - MIC*    AMPICILLIN Value in next row Resistant      RESISTANT>=32    VANCOMYCIN Value in next row Resistant      RESISTANT>=32    GENTAMICIN SYNERGY Value in next row Sensitive      RESISTANT>=32    LINEZOLID Value in next row Sensitive      SENSITIVE2    * LIGHT GROWTH VANCOMYCIN RESISTANT ENTEROCOCCUS  MRSA PCR Screening     Status: None    Collection Time: 05/20/15 12:30 PM  Result Value Ref Range Status   MRSA by PCR NEGATIVE NEGATIVE Final    Comment:        The GeneXpert MRSA Assay (FDA approved for NASAL specimens only), is one component of a comprehensive MRSA colonization surveillance program. It is not intended to diagnose MRSA infection nor to guide or monitor treatment for MRSA infections.   Anaerobic culture     Status: None   Collection Time: 05/24/15  8:31 PM  Result Value Ref Range Status   Specimen Description GROIN RIGHT  Final   Special Requests NONE  Final   Culture NO ANAEROBES ISOLATED  Final   Report Status 05/28/2015 FINAL  Final  Wound culture     Status: None   Collection Time: 05/24/15  9:13 PM  Result Value Ref Range Status   Specimen Description GROIN RIGHT  Final   Special Requests NONE  Final   Gram Stain FEW WBC SEEN MANY GRAM POSITIVE COCCI IN PAIRS   Final   Culture HEAVY GROWTH VANCOMYCIN RESISTANT ENTEROCOCCUS  Final   Report Status 05/27/2015 FINAL  Final   Organism ID, Bacteria VANCOMYCIN RESISTANT ENTEROCOCCUS  Final      Susceptibility   Vancomycin resistant enterococcus - MIC*    AMPICILLIN >=32 RESISTANT Resistant     VANCOMYCIN >=32 RESISTANT Resistant     GENTAMICIN SYNERGY SENSITIVE Sensitive     LINEZOLID 2 SENSITIVE Sensitive     * HEAVY GROWTH VANCOMYCIN RESISTANT ENTEROCOCCUS  Body fluid culture     Status: None   Collection Time: 05/31/15 10:57 AM  Result Value Ref Range Status   Specimen Description CYTOPLEU  Final   Special Requests NONE  Final   Gram Stain   Final    MODERATE RED BLOOD CELLS FEW WBC SEEN NO ORGANISMS SEEN    Culture No growth aerobically or anaerobically.  Final   Report Status 06/06/2015 FINAL  Final    Coagulation Studies: No results for input(s): LABPROT, INR in the last 72 hours.  Urinalysis: No results for input(s): COLORURINE, LABSPEC, [place], [place], [place],  BILIRUBINUR, KETONESUR, PROTEINUR, UROBILINOGEN, NITRITE,  LEUKOCYTESUR in the last 72 hours.  Invalid input(s): APPERANCEUR    Imaging: No results found.   Medications:     . sodium chloride   Intravenous Once  . amLODipine  5 mg Oral Daily  . ceftAZIDime (FORTAZ) IVPB 1 gram/50 mL D5W (Pyxis)  1 g Intravenous Q T,Th,Sa-HD  . DAPTOmycin (CUBICIN)  IV  6 mg/kg Intravenous Q48H  . docusate sodium  100 mg Oral BID  . famotidine  20 mg Oral Daily  . fluticasone  2 spray Each Nare Daily  . fluticasone furoate-vilanterol  1 puff Inhalation Daily  . hydrALAZINE  25 mg Oral 3 times per day  . Influenza vac split quadrivalent PF  0.5 mL Intramuscular Tomorrow-1000  . levothyroxine  88 mcg Oral QAC breakfast  . metoprolol  25 mg Oral 3 times per day  . multivitamin  1 tablet Oral Daily  . pantoprazole  40 mg Oral BID AC  . sodium chloride  1 spray Each Nare 5 X Daily  . sodium chloride flush  3 mL Intravenous Q12H   sodium chloride, sodium chloride, sodium chloride, acetaminophen **OR** acetaminophen, albuterol, ALPRAZolam, alteplase, alum & mag hydroxide-simeth, anticoagulant sodium citrate, HYDROcodone-acetaminophen, lidocaine (PF), lidocaine-prilocaine, loperamide, magnesium hydroxide, ondansetron **OR** ondansetron (ZOFRAN) IV, pentafluoroprop-tetrafluoroeth, promethazine, senna-docusate, sodium chloride flush  Assessment/ Plan:  65 y.o. female with COPD, hypertension, hyperlipidemia, vaginal intraepithelial neoplasia, emergent AAA repair status post rupture on November 18, left to right femoral to femoral bypass , acute renal failure leading to permanent dialysis, significant 3 vessel disease, HIT positive, thrombocytopenia.  1.  ESRD on HD:  Has had hypotension with HD recently. Anemia and decreased circulating volume may be playing a role.   -Patient had hemodialysis yesterday, will need HD again tomorrow.    2.  Anemia of CKD/suspected blood loss:   -  Patient transfused 2 units PRBCs this admission. -  EGD performed and revealed  duodenal AVM.  3.  SHPTH:  Phosphorus 4.6 at the moment.  No indication for binders at the moment.  4.  Thrombocytopenia:  HIT positive, pack catheter with citrate while here.  Platelets 60k now.     LOS: 6 Andra Matsuo 3/24/20173:20 PM

## 2015-06-21 NOTE — Care Management Important Message (Signed)
Important Message  Patient Details  Name: [name] MRN: [id number] Date of Birth: 08/01/1950   Medicare Important Message Given:  Yes    Beverly Sessions, RN 06/21/2015, 11:23 AM

## 2015-06-21 NOTE — Progress Notes (Signed)
Pharmacy Antibiotic Note  [name] is a 65 y.o. female with ESRD on HD admitted on [id number] with a complicated vascular history as detailed by Dr [name] and [name]. She is currently dealing with a seroma in her R groin,and was admitted last month when this was draining purulent material and with surrounding redness, pain and induration .Culture from the R wound grew pseudomonas and VRE, she underwent I and D 2/24 by Dr Delana Meyer and was discharged 3/4 on IV ceftazidime at HD and PO linezolid  Pharmacy is consulted to dose daptomycin.   Plan: Daptomycin 6 mg/kg IV Q48H-(Hemodialysis dosing ) Baseline CK 25.   Recommend holding statin for duration of daptomycin and montoring CK at least weekly.  (Patient is also on Ceftazidime-Per ID plan 6 weeks tx  from surgery on 2/24)  Height: 5\' 4"  (162.6 cm) Weight: 134 lb 11.2 oz (61.1 kg) IBW/kg (Calculated) : 54.7  Temp (24hrs), Avg:98.1 F (36.7 C), Min:97.7 F (36.5 C), Max:98.7 F (37.1 C)   Recent Labs Lab 06/15/15 1428 06/16/15 0544 06/17/15 0531 06/18/15 0637 06/20/15 0403  WBC 4.9 4.2 5.2 5.0 3.7  CREATININE 3.06* 3.69* 4.46*  --  3.31*    Estimated Creatinine Clearance: 14.4 mL/min (by C-G formula based on Cr of 3.31).    Allergies  Allergen Reactions  . Asa [Aspirin] Other (See Comments)    unknown  . 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 Plavix [Clopidogrel] Other (See Comments)    unknown    Antimicrobials this admission: 3/21 ceftazidime >>  3/21 daptomycin >>   Dose adjustments this admission:   Microbiology results:  BCx:   UCx:    Sputum:    MRSA PCR:   Thank you for allowing pharmacy to be a part of this patient's care.  [place] Festa A 06/21/2015 1:02 PM

## 2015-06-21 NOTE — Care Management (Signed)
RNCM consult noted for chronic home O2.  Patient is from SNF.  CSW following.  RNCM signing off

## 2015-06-21 NOTE — Progress Notes (Signed)
[facility] [place] NAME: [name]    MR#:  [id number]  DATE OF BIRTH:  [birth year]  SUBJECTIVE;seen at bedside.pt had EGD and AVM is clipped. overall she feels better. Denies any complaints.   CHIEF COMPLAINT:   Chief Complaint  Patient presents with  . Hypotension  . Chest Pain   - admitted with hypotension during dialysis -  REVIEW OF SYSTEMS:  Review of Systems  Constitutional: Negative for fever, chills and malaise/fatigue.  HENT: Negative for ear discharge, ear pain and nosebleeds.   Eyes: Negative for blurred vision and double vision.  Respiratory: Negative for cough, shortness of breath and wheezing.   Cardiovascular: Negative for chest pain, palpitations and leg swelling.  Gastrointestinal: Negative for nausea, vomiting, abdominal pain, diarrhea and constipation.  Genitourinary: Negative for dysuria and urgency.  Musculoskeletal: Negative for myalgias and back pain.  Neurological: Negative for dizziness, sensory change, speech change, focal weakness, seizures and headaches.  Psychiatric/Behavioral: Negative for depression.    DRUG ALLERGIES:   Allergies  Allergen Reactions  . Asa [Aspirin] Other (See Comments)    unknown  . 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 Plavix [Clopidogrel] Other (See Comments)    unknown    VITALS:  Blood pressure 147/62, pulse 72, temperature 97.9 F (36.6 C), temperature source Oral, resp. rate 20, height 5\' 4"  (1.626 m), weight 61.1 kg (134 lb 11.2 oz), last menstrual period 03/30/1988, SpO2 98 %.  PHYSICAL EXAMINATION:  Physical Exam  GENERAL:  65 y.o.-year-old patient lying in the bed with no acute distress. Pale appearing EYES: Pupils equal, round, reactive to light and accommodation. No scleral icterus. Extraocular muscles intact.  HEENT: Head atraumatic, normocephalic. Oropharynx and nasopharynx clear.  NECK:  Supple, no jugular venous  distention. No thyroid enlargement, no tenderness.  LUNGS: Normal breath sounds bilaterally, no wheezing, rales,rhonchi or crepitation. No use of accessory muscles of respiration. Decreased bibasilar breath sounds CARDIOVASCULAR: S1, S2 normal. No murmurs, rubs, or gallops.   ABDOMEN: Soft, nontender, nondistended. Bowel sounds present. No organomegaly or mass.  EXTREMITIES: No pedal edema, cyanosis, or clubbing. Right groin wound vac in place, no evidence of infection, erythema otherwise. NEUROLOGIC: Cranial nerves II through XII are intact. Muscle strength 5/5 in all extremities. Sensation intact. Gait not checked.  PSYCHIATRIC: The patient is alert and oriented x 3.   SKIN: right groin wound is  Granulating.Marland Kitchen   LABORATORY PANEL:   CBC  Recent Labs Lab 06/20/15 0403  WBC 3.7  HGB 7.0*  HCT 20.7*  PLT 60*   ------------------------------------------------------------------------------------------------------------------  Chemistries   Recent Labs Lab 06/15/15 1428  06/18/15 0637 06/20/15 0403  NA 135  < >  --  134*  K 3.0*  < >  --  3.1*  CL 98*  < >  --  98*  CO2 30  < >  --  26  GLUCOSE 90  < >  --  65  BUN 25*  < >  --  40*  CREATININE 3.06*  < >  --  3.31*  CALCIUM 8.0*  < >  --  8.4*  MG  --   --  1.9  --   AST 31  --   --   --   ALT 14  --   --   --   ALKPHOS 78  --   --   --   BILITOT 0.6  --   --   --   < > =  values in this interval not displayed. ------------------------------------------------------------------------------------------------------------------  Cardiac Enzymes  Recent Labs Lab 06/15/15 1428  TROPONINI <0.03   ------------------------------------------------------------------------------------------------------------------  RADIOLOGY:  No results found.  EKG:   Orders placed or performed during the hospital encounter of 06/15/15  . ED EKG  . ED EKG  . EKG 12-Lead  . EKG 12-Lead    ASSESSMENT AND PLAN:   Extensive  65 year old female with past medical history significant for ESRD on Tuesday Thursday Saturday hemodialysis, recent pseudomonal right groin seroma infection status post wound VAC placement, hypertension and history of pneumonia and diastolic CHF exacerbation presents to the hospital from [place] secondary to hypotension during dialysis  #1 hypotension- likely hypovolemic and also from anemia - Improved.    2 .Acute on chronic anemia- anemia of chronic disease. S/[p 2 unts of  RBC transfusion. Hemoglobin 7,, patient has no further symptoms. We'll transfuse him 1 unit of packed RBC today, likely discharge tomorrow after hemodialysis.   Because of guaiac-positive stools and anemia S/p EGD;showing AVM,clipped.  .#3right groin infected seroma with Pseudomonas-continue Fortaz with dialysis, started on daptomycin by ID.needs Daptomycin at SNF/zyvox stopped due to bone marrow toxicity  That she experienced after starting it. Wound vac changed today.,has significant improvement with granulation tissue,  #4 end-stage renal disease with hemodialysis-on Tuesday Thursday Saturday schedule. Nephrology has been consulted.   #5 hypertension- resolved hypotension. Resumed Norvasc.restart hydralazine.  . #6 DVT prophylaxis-Ted's and SCDs. No heparin products due to anemia.   All the records are reviewed and case discussed with Care Management/Social Workerr. Management plans discussed with the patient, family and they are in agreement.  CODE STATUS: Full Code  TOTAL TIME TAKING CARE OF THIS PATIENT: 25 minutes.   POSSIBLE D/C IN 2 DAYS, DEPENDING ON CLINICAL CONDITION.   Epifanio Lesches M.D on 06/21/2015 at 1:09 PM  Between 7am to 6pm - Pager - [phone]  After 6pm go to [email or web address] - password [email or web address] [facility]  Office  [phone]  CC: Primary care physician; Arnette Norris, MD

## 2015-06-22 LAB — TYPE AND SCREEN
ABO/RH(D): B POS
ANTIBODY SCREEN: NEGATIVE
Unit division: 0

## 2015-06-22 NOTE — Progress Notes (Signed)
[facility]  ROUNDING NOTE   Subjective:  We are still awaiting prior approval for daptomycin as an outpatient. Overall she has improved over the course of the hospitalization. Hemoglobin now up to 8.6.   Objective:  Vital signs in last 24 hours:  Temp:  [97.8 F (36.6 C)-98.6 F (37 C)] 98.3 F (36.8 C) (03/24 2110) Pulse Rate:  [72-81] 78 (03/24 2110) Resp:  [18-20] 18 (03/24 2110) BP: (144-158)/(60-69) 157/62 mmHg (03/24 2110) SpO2:  [97 %-98 %] 97 % (03/24 2110)  Weight change:  Filed Weights   06/18/15 1500 06/20/15 1505 06/20/15 1828  Weight: 63.2 kg (139 lb 5.3 oz) 62.2 kg (137 lb 2 oz) 61.1 kg (134 lb 11.2 oz)    Intake/Output: I/O last 3 completed shifts: In: 76 [P.O.:120; I.V.:3; Blood:275; IV Piggyback:109] Out: 28 [Urine:1; Drains:25; Stool:2]   Intake/Output this shift:     Physical Exam: General: NAD, resting in bed  Head: Normocephalic, atraumatic. Moist oral mucosal membranes  Eyes: Anicteric  Neck: Supple, trachea midline  Lungs:  Clear to auscultation normal effort  Heart: S1S2 no rubs  Abdomen:  Soft, nontender, BS present  Extremities: Trace LE edema.  Neurologic: Nonfocal, moving all four extremities  Skin: No lesions  Access: R IJ PC    Basic Metabolic Panel:  Recent Labs Lab 06/15/15 1428 06/16/15 0544 06/17/15 0531 06/18/15 0637 06/18/15 1531 06/20/15 0403  NA 135 136 133*  --   --  134*  K 3.0* 3.1* 3.4*  --   --  3.1*  CL 98* 101 98*  --   --  98*  CO2 30 25 25   --   --  26  GLUCOSE 90 71 76  --   --  65  BUN 25* 35* 53*  --   --  40*  CREATININE 3.06* 3.69* 4.46*  --   --  3.31*  CALCIUM 8.0* 8.2* 8.3*  --   --  8.4*  MG  --   --   --  1.9  --   --   PHOS  --   --   --   --  2.2* 4.6    Liver Function Tests:  Recent Labs Lab 06/15/15 1428 06/20/15 0403  AST 31  --   ALT 14  --   ALKPHOS 78  --   BILITOT 0.6  --   PROT 5.7*  --   ALBUMIN 2.3* 2.2*   No results for input(s): LIPASE, AMYLASE in  the last 168 hours. No results for input(s): AMMONIA in the last 168 hours.  CBC:  Recent Labs Lab 06/15/15 1428 06/16/15 0544  06/17/15 0531 06/18/15 0637 06/20/15 0403 06/21/15 1400 06/21/15 2031  WBC 4.9 4.2  --  5.2 5.0 3.7  --   --   HGB 6.8* 6.5*  < > 7.6* 7.4* 7.0* 7.5* 8.6*  HCT 20.0* 18.9*  < > 21.9* 21.4* 20.7* 22.2* 25.4*  MCV 91.6 91.0  --  84.7 87.1 87.5  --   --   PLT 123* 78*  --  80* 97* 60*  --   --   < > = values in this interval not displayed.  Cardiac Enzymes:  Recent Labs Lab 06/15/15 1428 06/18/15 2144  CKTOTAL  --  25*  TROPONINI <0.03  --     BNP: Invalid input(s): POCBNP  CBG: No results for input(s): GLUCAP in the last 168 hours.  Microbiology: Results for orders placed or performed during the hospital encounter of  05/20/15  Wound culture     Status: None   Collection Time: 05/20/15 12:25 PM  Result Value Ref Range Status   Specimen Description GROIN RIGHT  Final   Special Requests NONE  Final   Gram Stain   Final    FEW WBC SEEN FEW GRAM NEGATIVE RODS RARE GRAM POSITIVE COCCI    Culture   Final    RARE PSEUDOMONAS AERUGINOSA LIGHT GROWTH VANCOMYCIN RESISTANT ENTEROCOCCUS    Report Status 05/25/2015 FINAL  Final   Organism ID, Bacteria PSEUDOMONAS AERUGINOSA  Final   Organism ID, Bacteria VANCOMYCIN RESISTANT ENTEROCOCCUS  Final      Susceptibility   Pseudomonas aeruginosa - MIC*    CEFTAZIDIME 4 SENSITIVE Sensitive     CIPROFLOXACIN 1 SENSITIVE Sensitive     GENTAMICIN <=1 SENSITIVE Sensitive     IMIPENEM 1 SENSITIVE Sensitive     CEFEPIME 4 SENSITIVE Sensitive     * RARE PSEUDOMONAS AERUGINOSA   Vancomycin resistant enterococcus - MIC*    AMPICILLIN Value in next row Resistant      RESISTANT>=32    VANCOMYCIN Value in next row Resistant      RESISTANT>=32    GENTAMICIN SYNERGY Value in next row Sensitive      RESISTANT>=32    LINEZOLID Value in next row Sensitive      SENSITIVE2    * LIGHT GROWTH VANCOMYCIN RESISTANT  ENTEROCOCCUS  MRSA PCR Screening     Status: None   Collection Time: 05/20/15 12:30 PM  Result Value Ref Range Status   MRSA by PCR NEGATIVE NEGATIVE Final    Comment:        The GeneXpert MRSA Assay (FDA approved for NASAL specimens only), is one component of a comprehensive MRSA colonization surveillance program. It is not intended to diagnose MRSA infection nor to guide or monitor treatment for MRSA infections.   Anaerobic culture     Status: None   Collection Time: 05/24/15  8:31 PM  Result Value Ref Range Status   Specimen Description GROIN RIGHT  Final   Special Requests NONE  Final   Culture NO ANAEROBES ISOLATED  Final   Report Status 05/28/2015 FINAL  Final  Wound culture     Status: None   Collection Time: 05/24/15  9:13 PM  Result Value Ref Range Status   Specimen Description GROIN RIGHT  Final   Special Requests NONE  Final   Gram Stain FEW WBC SEEN MANY GRAM POSITIVE COCCI IN PAIRS   Final   Culture HEAVY GROWTH VANCOMYCIN RESISTANT ENTEROCOCCUS  Final   Report Status 05/27/2015 FINAL  Final   Organism ID, Bacteria VANCOMYCIN RESISTANT ENTEROCOCCUS  Final      Susceptibility   Vancomycin resistant enterococcus - MIC*    AMPICILLIN >=32 RESISTANT Resistant     VANCOMYCIN >=32 RESISTANT Resistant     GENTAMICIN SYNERGY SENSITIVE Sensitive     LINEZOLID 2 SENSITIVE Sensitive     * HEAVY GROWTH VANCOMYCIN RESISTANT ENTEROCOCCUS  Body fluid culture     Status: None   Collection Time: 05/31/15 10:57 AM  Result Value Ref Range Status   Specimen Description CYTOPLEU  Final   Special Requests NONE  Final   Gram Stain   Final    MODERATE RED BLOOD CELLS FEW WBC SEEN NO ORGANISMS SEEN    Culture No growth aerobically or anaerobically.  Final   Report Status 06/06/2015 FINAL  Final    Coagulation Studies: No results for input(s): LABPROT, INR in  the last 72 hours.  Urinalysis: No results for input(s): COLORURINE, LABSPEC, PHURINE, GLUCOSEU, HGBUR,  BILIRUBINUR, KETONESUR, PROTEINUR, UROBILINOGEN, NITRITE, LEUKOCYTESUR in the last 72 hours.  Invalid input(s): APPERANCEUR    Imaging: No results found.   Medications:     . amLODipine  5 mg Oral Daily  . ceftAZIDime (FORTAZ) IVPB 1 gram/50 mL D5W (Pyxis)  1 g Intravenous Q T,Th,Sa-HD  . DAPTOmycin (CUBICIN)  IV  6 mg/kg Intravenous Q48H  . docusate sodium  100 mg Oral BID  . famotidine  20 mg Oral Daily  . fluticasone  2 spray Each Nare Daily  . fluticasone furoate-vilanterol  1 puff Inhalation Daily  . hydrALAZINE  25 mg Oral 3 times per day  . Influenza vac split quadrivalent PF  0.5 mL Intramuscular Tomorrow-1000  . levothyroxine  88 mcg Oral QAC breakfast  . metoprolol  25 mg Oral 3 times per day  . multivitamin  1 tablet Oral Daily  . pantoprazole  40 mg Oral BID AC  . sodium chloride  1 spray Each Nare 5 X Daily  . sodium chloride flush  3 mL Intravenous Q12H   sodium chloride, sodium chloride, sodium chloride, acetaminophen **OR** acetaminophen, albuterol, ALPRAZolam, alteplase, alum & mag hydroxide-simeth, anticoagulant sodium citrate, HYDROcodone-acetaminophen, lidocaine (PF), lidocaine-prilocaine, loperamide, magnesium hydroxide, ondansetron **OR** ondansetron (ZOFRAN) IV, pentafluoroprop-tetrafluoroeth, promethazine, senna-docusate, sodium chloride flush  Assessment/ Plan:  65 y.o. female with COPD, hypertension, hyperlipidemia, vaginal intraepithelial neoplasia, emergent AAA repair status post rupture on November 18, left to right femoral to femoral bypass , acute renal failure leading to permanent dialysis, significant 3 vessel disease, HIT positive, thrombocytopenia.  1.  ESRD on HD:  Has had hypotension with HD recently. Anemia and decreased circulating volume may be playing a role.   -There is a dialysis nurse shortage today. Therefore we will proceed with dialysis tomorrow a.m. This was explained to the patient. She verbalizes understanding.    2.  Anemia of  CKD/suspected blood loss:   -  Patient transfused 3 units PRBCs this admission. -  EGD performed and revealed duodenal AVM. -  Hemoglobin currently up to 8.6.  3.  SHPTH:  Patient off binders at the moment. Phosphorous under good control.  4.  Thrombocytopenia:  HIT positive, pack catheter with citrate while here.  Platelets 60k now.     LOS: 7 Leiann Sporer 3/25/201710:10 AM

## 2015-06-22 NOTE — Progress Notes (Signed)
Nutrition Follow-up     INTERVENTION:  Meals and snacks: Recommend liberalizing diet to regular secondary to poor po intake. Encouraged small frequent meals as reports reflux. Will send yogurt as pm snack Medical Nutrition Supplement Therapy: pt does not like oral nutrition supplements or prostat and refuses them   NUTRITION DIAGNOSIS:   Increased nutrient needs related to chronic illness as evidenced by estimated needs.    GOAL:   Patient will meet greater than or equal to 90% of their needs    MONITOR:   PO intake, Labs, Skin  REASON FOR ASSESSMENT:    (pressure ulcer)    ASSESSMENT:      Pt reporting reflux this pm during rounds, s/p EGD   Current Nutrition: no lunch eaten, full tray at bedside (only ordered corn and mashed potatoes).  Limited intake during admission (8 days). Ate 1/2 biscuit husband brought in this am.  Asking for yogurt    Scheduled Medications:  . amLODipine  5 mg Oral Daily  . ceftAZIDime (FORTAZ) IVPB 1 gram/50 mL D5W (Pyxis)  1 g Intravenous Q T,Th,Sa-HD  . DAPTOmycin (CUBICIN)  IV  6 mg/kg Intravenous Q48H  . docusate sodium  100 mg Oral BID  . famotidine  20 mg Oral Daily  . fluticasone  2 spray Each Nare Daily  . fluticasone furoate-vilanterol  1 puff Inhalation Daily  . hydrALAZINE  25 mg Oral 3 times per day  . Influenza vac split quadrivalent PF  0.5 mL Intramuscular Tomorrow-1000  . levothyroxine  88 mcg Oral QAC breakfast  . metoprolol  25 mg Oral 3 times per day  . multivitamin  1 tablet Oral Daily  . pantoprazole  40 mg Oral BID AC  . sodium chloride  1 spray Each Nare 5 X Daily  . sodium chloride flush  3 mL Intravenous Q12H       Electrolyte/Renal Profile and Glucose Profile:   Recent Labs Lab 06/16/15 0544 06/17/15 0531 06/18/15 0637 06/18/15 1531 06/20/15 0403  NA 136 133*  --   --  134*  K 3.1* 3.4*  --   --  3.1*  CL 101 98*  --   --  98*  CO2 25 25  --   --  26  BUN 35* 53*  --   --  40*  CREATININE  3.69* 4.46*  --   --  3.31*  CALCIUM 8.2* 8.3*  --   --  8.4*  MG  --   --  1.9  --   --   PHOS  --   --   --  2.2* 4.6  GLUCOSE 71 76  --   --  65   Protein Profile:  Recent Labs Lab 06/15/15 1428 06/20/15 0403  ALBUMIN 2.3* 2.2*      Diet Order:  Diet renal with fluid restriction Fluid restriction:: 1200 mL Fluid; Room service appropriate?: Yes; Fluid consistency:: Thin  Skin:   reviewed   Height:   Ht Readings from Last 1 Encounters:  06/15/15 5\' 4"  (1.626 m)    Weight:   Wt Readings from Last 1 Encounters:  06/20/15 134 lb 11.2 oz (61.1 kg)    Ideal Body Weight:     BMI:  Body mass index is 23.11 kg/(m^2).  Estimated Nutritional Needs:   Kcal:  BEE 1075 kcals (IF 1.0-1.2, AF 1.3) BS:[id number] kcals/d.   Protein:  (1.2-1.5 g/kg) 66-83 g/d  Fluid:  (1000 + UOP)  EDUCATION NEEDS:   No education needs identified  at this time  [place]. Zenia Resides, [place], [place] (pager) Weekend/On-Call pager [phone])

## 2015-06-22 NOTE — Progress Notes (Signed)
[facility] at [place] NAME: [name]    MR#:  [id number]  DATE OF BIRTH:  12-23-1950  SUBJECTIV e; she denies any complaints. Waiting for outpatient daptomycin approval for discharge.   CHIEF COMPLAINT:   Chief Complaint  Patient presents with  . Hypotension  . Chest Pain   - admitted with hypotension during dialysis -  REVIEW OF SYSTEMS:  Review of Systems  Constitutional: Negative for fever, chills and malaise/fatigue.  HENT: Negative for ear discharge, ear pain and nosebleeds.   Eyes: Negative for blurred vision and double vision.  Respiratory: Negative for cough, shortness of breath and wheezing.   Cardiovascular: Negative for chest pain, palpitations and leg swelling.  Gastrointestinal: Negative for nausea, vomiting, abdominal pain, diarrhea and constipation.  Genitourinary: Negative for dysuria and urgency.  Musculoskeletal: Negative for myalgias and back pain.  Neurological: Negative for dizziness, sensory change, speech change, focal weakness, seizures and headaches.  Psychiatric/Behavioral: Negative for depression.    DRUG ALLERGIES:   Allergies  Allergen Reactions  . Asa [Aspirin] Other (See Comments)    unknown  . 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 Plavix [Clopidogrel] Other (See Comments)    unknown    VITALS:  Blood pressure 157/62, pulse 78, temperature 98.3 F (36.8 C), temperature source Oral, resp. rate 18, height 5\' 4"  (1.626 m), weight 61.1 kg (134 lb 11.2 oz), last menstrual period 03/30/1988, SpO2 97 %.  PHYSICAL EXAMINATION:  Physical Exam  GENERAL:  65 y.o.-year-old patient lying in the bed with no acute distress. Pale appearing EYES: Pupils equal, round, reactive to light and accommodation. No scleral icterus. Extraocular muscles intact.  HEENT: Head atraumatic, normocephalic. Oropharynx and nasopharynx clear.  NECK:  Supple, no jugular venous distention. No  thyroid enlargement, no tenderness.  LUNGS: Normal breath sounds bilaterally, no wheezing, rales,rhonchi or crepitation. No use of accessory muscles of respiration. Decreased bibasilar breath sounds CARDIOVASCULAR: S1, S2 normal. No murmurs, rubs, or gallops.   ABDOMEN: Soft, nontender, nondistended. Bowel sounds present. No organomegaly or mass.  EXTREMITIES: No pedal edema, cyanosis, or clubbing. Right groin wound vac in place, no evidence of infection, erythema otherwise. NEUROLOGIC: Cranial nerves II through XII are intact. Muscle strength 5/5 in all extremities. Sensation intact. Gait not checked.  PSYCHIATRIC: The patient is alert and oriented x 3.   SKIN: right groin wound is  Granulating.Marland Kitchen   LABORATORY PANEL:   CBC  Recent Labs Lab 06/20/15 0403  06/21/15 2031  WBC 3.7  --   --   HGB 7.0*  < > 8.6*  HCT 20.7*  < > 25.4*  PLT 60*  --   --   < > = values in this interval not displayed. ------------------------------------------------------------------------------------------------------------------  Chemistries   Recent Labs Lab 06/15/15 1428  06/18/15 0637 06/20/15 0403  NA 135  < >  --  134*  K 3.0*  < >  --  3.1*  CL 98*  < >  --  98*  CO2 30  < >  --  26  GLUCOSE 90  < >  --  65  BUN 25*  < >  --  40*  CREATININE 3.06*  < >  --  3.31*  CALCIUM 8.0*  < >  --  8.4*  MG  --   --  1.9  --   AST 31  --   --   --   ALT 14  --   --   --  ALKPHOS 78  --   --   --   BILITOT 0.6  --   --   --   < > = values in this interval not displayed. ------------------------------------------------------------------------------------------------------------------  Cardiac Enzymes  Recent Labs Lab 06/15/15 1428  TROPONINI <0.03   ------------------------------------------------------------------------------------------------------------------  RADIOLOGY:  No results found.  EKG:   Orders placed or performed during the hospital encounter of 06/15/15  . ED EKG  .  ED EKG  . EKG 12-Lead  . EKG 12-Lead    ASSESSMENT AND PLAN:   Extensive 65 year old female with past medical history significant for ESRD on Tuesday Thursday Saturday hemodialysis, recent pseudomonal right groin seroma infection status post wound VAC placement, hypertension and history of pneumonia and diastolic CHF exacerbation presents to the hospital from [place] secondary to hypotension during dialysis  #1 hypotension- likely hypovolemic and also from anemia - Improved.    2 .Acute on chronic anemia- anemia of chronic disease. S/[p 2 unts of  RBC transfusion. Hemoglobin 7,, patient has no further symptoms. Has used one more unit of packed RBC yesterday. Hemoglobin improved from 7-7.5-8.6.  Goal  is to keep hemoglobin above 7. S/p EGD;showing AVM,clipped.  .#3right groin infected seroma with Pseudomonas-continue Fortaz with dialysis, started on daptomycin by ID.needs Daptomycin at SNF/zyvox stopped due to bone marrow toxicity  That she experienced after starting it. Wound vac changed today.,has significant improvement with granulation tissue,  #4 end-stage renal disease with hemodialysis-on Tuesday Thursday Saturday schedule. Nephrology has been consulted.   #5 hypertension- resolved hypotension. Resumed Norvasc.restart ed hydralazine.  . #6 DVT prophylaxis-Ted's and SCDs. No heparin products due to anemia.   All the records are reviewed and case discussed with Care Management/Social Workerr. Management plans discussed with the patient, family and they are in agreement.  CODE STATUS: Full Code  TOTAL TIME TAKING CARE OF THIS PATIENT: 25 minutes.   POSSIBLE D/C IN 2 DAYS, DEPENDING ON CLINICAL CONDITION. An outpatient authorization for daptomycin.   Epifanio Lesches M.D on 06/22/2015 at 11:15 AM  Between 7am to 6pm - Pager - [phone]  After 6pm go to [email or web address] - password [email or web address] [facility]  Office  [phone]  CC: Primary  care physician; Arnette Norris, MD

## 2015-06-23 ENCOUNTER — Inpatient Hospital Stay: Payer: Medicare Other

## 2015-06-23 LAB — BASIC METABOLIC PANEL
Anion gap: 6 (ref 5–15)
BUN: 29 mg/dL — ABNORMAL HIGH (ref 6–20)
CALCIUM: 8.5 mg/dL — AB (ref 8.9–10.3)
CHLORIDE: 101 mmol/L (ref 101–111)
CO2: 28 mmol/L (ref 22–32)
CREATININE: 3.9 mg/dL — AB (ref 0.44–1.00)
GFR calc non Af Amer: 11 mL/min — ABNORMAL LOW (ref >60)
GFR, EST AFRICAN AMERICAN: 13 mL/min — AB (ref >60)
GLUCOSE: 90 mg/dL (ref 65–99)
Potassium: 3.4 mmol/L — ABNORMAL LOW (ref 3.5–5.1)
Sodium: 135 mmol/L (ref 135–145)

## 2015-06-23 MED ORDER — DEXTROSE 5 % IV SOLN
1.0000 g | Freq: Once | INTRAVENOUS | Status: AC
  Administered 2015-06-23: 1 g via INTRAVENOUS
  Filled 2015-06-23: qty 1

## 2015-06-23 NOTE — Progress Notes (Signed)
[facility] [place] NAME: [name]    MR#:  [id number]  DATE OF BIRTH:  Feb 15, 1951  , complains of poor appetite and doesn't feel like eating and feels like food is getting stuck in the chest. Very anxious ,And she feels like she can't breathe when she eats.   CHIEF COMPLAINT:   Chief Complaint  Patient presents with  . Hypotension  . Chest Pain   - admitted with hypotension during dialysis -  REVIEW OF SYSTEMS:  Review of Systems  Constitutional: Negative for fever, chills and malaise/fatigue.  HENT: Negative for ear discharge, ear pain and nosebleeds.   Eyes: Negative for blurred vision and double vision.  Respiratory: Negative for cough, shortness of breath and wheezing.   Cardiovascular: Negative for chest pain, palpitations and leg swelling.  Gastrointestinal: Negative for nausea, vomiting, abdominal pain, diarrhea and constipation.  Genitourinary: Negative for dysuria and urgency.  Musculoskeletal: Negative for myalgias and back pain.  Neurological: Negative for dizziness, sensory change, speech change, focal weakness, seizures and headaches.  Psychiatric/Behavioral: Negative for depression.    DRUG ALLERGIES:   Allergies  Allergen Reactions  . Asa [Aspirin] Other (See Comments)    unknown  . 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 Plavix [Clopidogrel] Other (See Comments)    unknown    VITALS:  Blood pressure 160/65, pulse 83, temperature 97.9 F (36.6 C), temperature source Axillary, resp. rate 17, height 5\' 4"  (1.626 m), weight 61.1 kg (134 lb 11.2 oz), last menstrual period 03/30/1988, SpO2 95 %.  PHYSICAL EXAMINATION:  Physical Exam  GENERAL:  65 y.o.-year-old patient lying in the bed with no acute distress. Pale appearing EYES: Pupils equal, round, reactive to light and accommodation. No scleral icterus. Extraocular muscles intact.  HEENT: Head atraumatic, normocephalic.  Oropharynx and nasopharynx clear.  NECK:  Supple, no jugular venous distention. No thyroid enlargement, no tenderness.  LUNGS: Normal breath sounds bilaterally, no wheezing, rales,rhonchi or crepitation. No use of accessory muscles of respiration. Decreased bibasilar breath sounds CARDIOVASCULAR: S1, S2 normal. No murmurs, rubs, or gallops.   ABDOMEN: Soft, nontender, nondistended. Bowel sounds present. No organomegaly or mass.  EXTREMITIES: No pedal edema, cyanosis, or clubbing. Right groin wound vac in place, no evidence of infection, erythema otherwise. NEUROLOGIC: Cranial nerves II through XII are intact. Muscle strength 5/5 in all extremities. Sensation intact. Gait not checked.  PSYCHIATRIC: The patient is alert and oriented x 3.   SKIN: right groin wound is  Granulating.Marland Kitchen   LABORATORY PANEL:   CBC  Recent Labs Lab 06/20/15 0403  06/21/15 2031  WBC 3.7  --   --   HGB 7.0*  < > 8.6*  HCT 20.7*  < > 25.4*  PLT 60*  --   --   < > = values in this interval not displayed. ------------------------------------------------------------------------------------------------------------------  Chemistries   Recent Labs Lab 06/18/15 [phone]  06/23/15 0310  NA  --   < > 135  K  --   < > 3.4*  CL  --   < > 101  CO2  --   < > 28  GLUCOSE  --   < > 90  BUN  --   < > 29*  CREATININE  --   < > 3.90*  CALCIUM  --   < > 8.5*  MG 1.9  --   --   < > = values in this interval not displayed. ------------------------------------------------------------------------------------------------------------------  Cardiac Enzymes No results for input(s): TROPONINI in the last 168 hours. ------------------------------------------------------------------------------------------------------------------  RADIOLOGY:  No results found.  EKG:   Orders placed or performed during the hospital encounter of 06/15/15  . ED EKG  . ED EKG  . EKG 12-Lead  . EKG 12-Lead    ASSESSMENT AND PLAN:    65 year old female with past medical history significant for ESRD on Tuesday Thursday Saturday hemodialysis, recent pseudomonal right groin seroma infection status post wound VAC placement, hypertension and history of pneumonia and diastolic CHF exacerbation presents to the hospital from [place] secondary to hypotension during dialysis  #1 hypotension- likely hypovolemic and also from anemia - Improved.    2 .Acute on chronic anemia- anemia of chronic disease. S/[p 2 unts of  RBC transfusion. Hemoglobin 7,, patient has no further symptoms. Has used one more unit of packed RBC yesterday. Hemoglobin improved from 7-7.5-8.6.  Goal  is to keep hemoglobin above 7. S/p EGD;showing AVM,clipped.  .#3right groin infected seroma with Pseudomonas-continue Fortaz with dialysis, started on daptomycin by ID.needs Daptomycin at SNF/zyvox stopped due to bone marrow toxicity  That she experienced after starting it. Wound vac changed today.,has significant improvement with granulation tissue,  #4 end-stage renal disease with hemodialysis-on Tuesday Thursday Saturday schedule. Nephrology has been consulted.   #5 hypertension- resolved hypotension. Resumed Norvasc.restart ed hydralazine.  . #6 DVT prophylaxis-Ted's and SCDs. No heparin products due to anemia. #7 Liberalized diet due to poor po itnake.Marland Kitchen EGDunremarkable for any stricture. Encouraged her to eat. Xanax as needed. Chest x-ray today  All the records are reviewed and case discussed with Care Management/Social Workerr. Management plans discussed with the patient, family and they are in agreement.  CODE STATUS: Full Code  TOTAL TIME TAKING CARE OF THIS PATIENT: 25 minutes.   POSSIBLE D/C IN 2 DAYS, DEPENDING ON CLINICAL CONDITION. An outpatient authorization for daptomycin.   Epifanio Lesches M.D on 06/23/2015 at 11:14 AM  Between 7am to 6pm - Pager - [phone]  After 6pm go to [email or web address] - password [email or web address] [facility]  Office  [phone]  CC: Primary care physician; Arnette Norris, MD

## 2015-06-23 NOTE — Progress Notes (Signed)
[facility]  ROUNDING NOTE   Subjective:  Patient seen and evaluated during hemodialysis. Blood flow rate 350. Still has a wound VAC in place in the right groin.    Objective:  Vital signs in last 24 hours:  Temp:  [97.9 F (36.6 C)] 97.9 F (36.6 C) (03/25 2040) Pulse Rate:  [83] 83 (03/25 2040) Resp:  [17] 17 (03/25 2040) BP: (160)/(65) 160/65 mmHg (03/25 2040) SpO2:  [95 %] 95 % (03/25 2040)  Weight change:  Filed Weights   06/18/15 1500 06/20/15 1505 06/20/15 1828  Weight: 63.2 kg (139 lb 5.3 oz) 62.2 kg (137 lb 2 oz) 61.1 kg (134 lb 11.2 oz)    Intake/Output: I/O last 3 completed shifts: In: 810.6 [P.O.:700; I.V.:3; IV Piggyback:107.6] Out: 4 [Urine:1; Stool:3]   Intake/Output this shift:  Total I/O In: -  Out: 2 [Urine:1; Stool:1]  Physical Exam: General: NAD, resting in bed  Head: Normocephalic, atraumatic. Moist oral mucosal membranes  Eyes: Anicteric  Neck: Supple, trachea midline  Lungs:  Clear to auscultation normal effort  Heart: S1S2 no rubs  Abdomen:  Soft, nontender, BS present  Extremities: Trace LE edema, wound vac in right groin  Neurologic: Nonfocal, moving all four extremities  Skin: No lesions  Access: R IJ PC    Basic Metabolic Panel:  Recent Labs Lab 06/17/15 0531 06/18/15 0637 06/18/15 1531 06/20/15 0403 06/23/15 0310  NA 133*  --   --  134* 135  K 3.4*  --   --  3.1* 3.4*  CL 98*  --   --  98* 101  CO2 25  --   --  26 28  GLUCOSE 76  --   --  65 90  BUN 53*  --   --  40* 29*  CREATININE 4.46*  --   --  3.31* 3.90*  CALCIUM 8.3*  --   --  8.4* 8.5*  MG  --  1.9  --   --   --   PHOS  --   --  2.2* 4.6  --     Liver Function Tests:  Recent Labs Lab 06/20/15 0403  ALBUMIN 2.2*   No results for input(s): LIPASE, AMYLASE in the last 168 hours. No results for input(s): AMMONIA in the last 168 hours.  CBC:  Recent Labs Lab 06/17/15 0531 06/18/15 0637 06/20/15 0403 06/21/15 1400 06/21/15 2031  WBC  5.2 5.0 3.7  --   --   HGB 7.6* 7.4* 7.0* 7.5* 8.6*  HCT 21.9* 21.4* 20.7* 22.2* 25.4*  MCV 84.7 87.1 87.5  --   --   PLT 80* 97* 60*  --   --     Cardiac Enzymes:  Recent Labs Lab 06/18/15 2144  CKTOTAL 25*    BNP: Invalid input(s): POCBNP  CBG: No results for input(s): GLUCAP in the last 168 hours.  Microbiology: Results for orders placed or performed during the hospital encounter of 05/20/15  Wound culture     Status: None   Collection Time: 05/20/15 12:25 PM  Result Value Ref Range Status   Specimen Description GROIN RIGHT  Final   Special Requests NONE  Final   Gram Stain   Final    FEW WBC SEEN FEW GRAM NEGATIVE RODS RARE GRAM POSITIVE COCCI    Culture   Final    RARE PSEUDOMONAS AERUGINOSA LIGHT GROWTH VANCOMYCIN RESISTANT ENTEROCOCCUS    Report Status 05/25/2015 FINAL  Final   Organism ID, Bacteria PSEUDOMONAS AERUGINOSA  Final  Organism ID, Bacteria VANCOMYCIN RESISTANT ENTEROCOCCUS  Final      Susceptibility   Pseudomonas aeruginosa - MIC*    CEFTAZIDIME 4 SENSITIVE Sensitive     CIPROFLOXACIN 1 SENSITIVE Sensitive     GENTAMICIN <=1 SENSITIVE Sensitive     IMIPENEM 1 SENSITIVE Sensitive     CEFEPIME 4 SENSITIVE Sensitive     * RARE PSEUDOMONAS AERUGINOSA   Vancomycin resistant enterococcus - MIC*    AMPICILLIN Value in next row Resistant      RESISTANT>=32    VANCOMYCIN Value in next row Resistant      RESISTANT>=32    GENTAMICIN SYNERGY Value in next row Sensitive      RESISTANT>=32    LINEZOLID Value in next row Sensitive      SENSITIVE2    * LIGHT GROWTH VANCOMYCIN RESISTANT ENTEROCOCCUS  MRSA PCR Screening     Status: None   Collection Time: 05/20/15 12:30 PM  Result Value Ref Range Status   MRSA by PCR NEGATIVE NEGATIVE Final    Comment:        The GeneXpert MRSA Assay (FDA approved for NASAL specimens only), is one component of a comprehensive MRSA colonization surveillance program. It is not intended to diagnose MRSA infection  nor to guide or monitor treatment for MRSA infections.   Anaerobic culture     Status: None   Collection Time: 05/24/15  8:31 PM  Result Value Ref Range Status   Specimen Description GROIN RIGHT  Final   Special Requests NONE  Final   Culture NO ANAEROBES ISOLATED  Final   Report Status 05/28/2015 FINAL  Final  Wound culture     Status: None   Collection Time: 05/24/15  9:13 PM  Result Value Ref Range Status   Specimen Description GROIN RIGHT  Final   Special Requests NONE  Final   Gram Stain FEW WBC SEEN MANY GRAM POSITIVE COCCI IN PAIRS   Final   Culture HEAVY GROWTH VANCOMYCIN RESISTANT ENTEROCOCCUS  Final   Report Status 05/27/2015 FINAL  Final   Organism ID, Bacteria VANCOMYCIN RESISTANT ENTEROCOCCUS  Final      Susceptibility   Vancomycin resistant enterococcus - MIC*    AMPICILLIN >=32 RESISTANT Resistant     VANCOMYCIN >=32 RESISTANT Resistant     GENTAMICIN SYNERGY SENSITIVE Sensitive     LINEZOLID 2 SENSITIVE Sensitive     * HEAVY GROWTH VANCOMYCIN RESISTANT ENTEROCOCCUS  Body fluid culture     Status: None   Collection Time: 05/31/15 10:57 AM  Result Value Ref Range Status   Specimen Description CYTOPLEU  Final   Special Requests NONE  Final   Gram Stain   Final    MODERATE RED BLOOD CELLS FEW WBC SEEN NO ORGANISMS SEEN    Culture No growth aerobically or anaerobically.  Final   Report Status 06/06/2015 FINAL  Final    Coagulation Studies: No results for input(s): LABPROT, INR in the last 72 hours.  Urinalysis: No results for input(s): COLORURINE, LABSPEC, PHURINE, GLUCOSEU, HGBUR, BILIRUBINUR, KETONESUR, PROTEINUR, UROBILINOGEN, NITRITE, LEUKOCYTESUR in the last 72 hours.  Invalid input(s): APPERANCEUR    Imaging: No results found.   Medications:     . amLODipine  5 mg Oral Daily  . ceftAZIDime (FORTAZ) IVPB 1 gram/50 mL D5W (Pyxis)  1 g Intravenous Q T,Th,Sa-HD  . DAPTOmycin (CUBICIN)  IV  6 mg/kg Intravenous Q48H  . docusate sodium  100 mg  Oral BID  . famotidine  20 mg Oral Daily  .  fluticasone  2 spray Each Nare Daily  . fluticasone furoate-vilanterol  1 puff Inhalation Daily  . hydrALAZINE  25 mg Oral 3 times per day  . Influenza vac split quadrivalent PF  0.5 mL Intramuscular Tomorrow-1000  . levothyroxine  88 mcg Oral QAC breakfast  . metoprolol  25 mg Oral 3 times per day  . multivitamin  1 tablet Oral Daily  . pantoprazole  40 mg Oral BID AC  . sodium chloride  1 spray Each Nare 5 X Daily  . sodium chloride flush  3 mL Intravenous Q12H   sodium chloride, sodium chloride, sodium chloride, acetaminophen **OR** acetaminophen, albuterol, ALPRAZolam, alteplase, alum & mag hydroxide-simeth, anticoagulant sodium citrate, HYDROcodone-acetaminophen, lidocaine (PF), lidocaine-prilocaine, loperamide, magnesium hydroxide, ondansetron **OR** ondansetron (ZOFRAN) IV, pentafluoroprop-tetrafluoroeth, promethazine, senna-docusate, sodium chloride flush  Assessment/ Plan:  65 y.o. female with COPD, hypertension, hyperlipidemia, vaginal intraepithelial neoplasia, emergent AAA repair status post rupture on November 18, left to right femoral to femoral bypass , acute renal failure leading to permanent dialysis, significant 3 vessel disease, HIT positive, thrombocytopenia.  1.  ESRD on HD:  Has had hypotension with HD recently. Anemia and decreased circulating volume may be playing a role.   -Patient seen and evaluated during hemodialysis. Ultrafiltration target is 1-1.5 kg today.   2.  Anemia of CKD/suspected blood loss:   -  Patient transfused 3 units PRBCs this admission. -  EGD performed and revealed duodenal AVM. -  Last hemoglobin was 8.6. Continue to periodically monitor.  3.  SHPTH:  Phosphorous control is good without the use of binders. Continue to periodically monitor bone mineral metabolism parameters.  4.  Thrombocytopenia:  HIT positive, pack catheter with citrate while here.  Platelets 60k now.     LOS: [place] 3/26/201712:39 PM

## 2015-06-24 DIAGNOSIS — Z87891 Personal history of nicotine dependence: Secondary | ICD-10-CM | POA: Diagnosis not present

## 2015-06-24 DIAGNOSIS — N186 End stage renal disease: Secondary | ICD-10-CM | POA: Diagnosis present

## 2015-06-24 DIAGNOSIS — E7800 Pure hypercholesterolemia, unspecified: Secondary | ICD-10-CM | POA: Diagnosis not present

## 2015-06-24 DIAGNOSIS — Y832 Surgical operation with anastomosis, bypass or graft as the cause of abnormal reaction of the patient, or of later complication, without mention of misadventure at the time of the procedure: Secondary | ICD-10-CM | POA: Diagnosis present

## 2015-06-24 DIAGNOSIS — Q2733 Arteriovenous malformation of digestive system vessel: Secondary | ICD-10-CM | POA: Diagnosis not present

## 2015-06-24 DIAGNOSIS — R7881 Bacteremia: Secondary | ICD-10-CM | POA: Diagnosis not present

## 2015-06-24 DIAGNOSIS — Z8249 Family history of ischemic heart disease and other diseases of the circulatory system: Secondary | ICD-10-CM | POA: Diagnosis not present

## 2015-06-24 DIAGNOSIS — T814XXA Infection following a procedure, initial encounter: Secondary | ICD-10-CM | POA: Diagnosis not present

## 2015-06-24 DIAGNOSIS — A4152 Sepsis due to Pseudomonas: Secondary | ICD-10-CM | POA: Diagnosis not present

## 2015-06-24 DIAGNOSIS — Z8049 Family history of malignant neoplasm of other genital organs: Secondary | ICD-10-CM | POA: Diagnosis not present

## 2015-06-24 DIAGNOSIS — I214 Non-ST elevation (NSTEMI) myocardial infarction: Secondary | ICD-10-CM | POA: Diagnosis not present

## 2015-06-24 DIAGNOSIS — H8110 Benign paroxysmal vertigo, unspecified ear: Secondary | ICD-10-CM | POA: Diagnosis not present

## 2015-06-24 DIAGNOSIS — Z992 Dependence on renal dialysis: Secondary | ICD-10-CM | POA: Diagnosis not present

## 2015-06-24 DIAGNOSIS — I5032 Chronic diastolic (congestive) heart failure: Secondary | ICD-10-CM | POA: Diagnosis not present

## 2015-06-24 DIAGNOSIS — N2581 Secondary hyperparathyroidism of renal origin: Secondary | ICD-10-CM | POA: Diagnosis present

## 2015-06-24 DIAGNOSIS — Z79899 Other long term (current) drug therapy: Secondary | ICD-10-CM | POA: Diagnosis not present

## 2015-06-24 DIAGNOSIS — I10 Essential (primary) hypertension: Secondary | ICD-10-CM | POA: Diagnosis not present

## 2015-06-24 DIAGNOSIS — T827XXD Infection and inflammatory reaction due to other cardiac and vascular devices, implants and grafts, subsequent encounter: Secondary | ICD-10-CM | POA: Diagnosis not present

## 2015-06-24 DIAGNOSIS — E039 Hypothyroidism, unspecified: Secondary | ICD-10-CM | POA: Diagnosis present

## 2015-06-24 DIAGNOSIS — R262 Difficulty in walking, not elsewhere classified: Secondary | ICD-10-CM | POA: Diagnosis not present

## 2015-06-24 DIAGNOSIS — R0600 Dyspnea, unspecified: Secondary | ICD-10-CM | POA: Diagnosis not present

## 2015-06-24 DIAGNOSIS — R6520 Severe sepsis without septic shock: Secondary | ICD-10-CM | POA: Diagnosis not present

## 2015-06-24 DIAGNOSIS — E861 Hypovolemia: Secondary | ICD-10-CM | POA: Diagnosis not present

## 2015-06-24 DIAGNOSIS — D509 Iron deficiency anemia, unspecified: Secondary | ICD-10-CM | POA: Diagnosis not present

## 2015-06-24 DIAGNOSIS — L7634 Postprocedural seroma of skin and subcutaneous tissue following other procedure: Secondary | ICD-10-CM | POA: Diagnosis present

## 2015-06-24 DIAGNOSIS — Z7951 Long term (current) use of inhaled steroids: Secondary | ICD-10-CM | POA: Diagnosis not present

## 2015-06-24 DIAGNOSIS — Z792 Long term (current) use of antibiotics: Secondary | ICD-10-CM | POA: Diagnosis not present

## 2015-06-24 DIAGNOSIS — J810 Acute pulmonary edema: Secondary | ICD-10-CM | POA: Diagnosis not present

## 2015-06-24 DIAGNOSIS — S301XXD Contusion of abdominal wall, subsequent encounter: Secondary | ICD-10-CM | POA: Diagnosis not present

## 2015-06-24 DIAGNOSIS — J811 Chronic pulmonary edema: Secondary | ICD-10-CM | POA: Diagnosis not present

## 2015-06-24 DIAGNOSIS — A491 Streptococcal infection, unspecified site: Secondary | ICD-10-CM | POA: Diagnosis not present

## 2015-06-24 DIAGNOSIS — J449 Chronic obstructive pulmonary disease, unspecified: Secondary | ICD-10-CM | POA: Diagnosis present

## 2015-06-24 DIAGNOSIS — Z823 Family history of stroke: Secondary | ICD-10-CM | POA: Diagnosis not present

## 2015-06-24 DIAGNOSIS — D62 Acute posthemorrhagic anemia: Secondary | ICD-10-CM | POA: Diagnosis not present

## 2015-06-24 DIAGNOSIS — B9689 Other specified bacterial agents as the cause of diseases classified elsewhere: Secondary | ICD-10-CM | POA: Diagnosis present

## 2015-06-24 DIAGNOSIS — R1310 Dysphagia, unspecified: Secondary | ICD-10-CM | POA: Diagnosis not present

## 2015-06-24 DIAGNOSIS — J9621 Acute and chronic respiratory failure with hypoxia: Secondary | ICD-10-CM | POA: Diagnosis present

## 2015-06-24 DIAGNOSIS — K31819 Angiodysplasia of stomach and duodenum without bleeding: Secondary | ICD-10-CM | POA: Diagnosis present

## 2015-06-24 DIAGNOSIS — Z298 Encounter for other specified prophylactic measures: Secondary | ICD-10-CM | POA: Diagnosis not present

## 2015-06-24 DIAGNOSIS — D649 Anemia, unspecified: Secondary | ICD-10-CM | POA: Diagnosis not present

## 2015-06-24 DIAGNOSIS — J189 Pneumonia, unspecified organism: Secondary | ICD-10-CM | POA: Diagnosis not present

## 2015-06-24 DIAGNOSIS — I120 Hypertensive chronic kidney disease with stage 5 chronic kidney disease or end stage renal disease: Secondary | ICD-10-CM | POA: Diagnosis not present

## 2015-06-24 DIAGNOSIS — Z1622 Resistance to vancomycin related antibiotics: Secondary | ICD-10-CM | POA: Diagnosis not present

## 2015-06-24 DIAGNOSIS — D500 Iron deficiency anemia secondary to blood loss (chronic): Secondary | ICD-10-CM | POA: Diagnosis not present

## 2015-06-24 DIAGNOSIS — Z9981 Dependence on supplemental oxygen: Secondary | ICD-10-CM | POA: Diagnosis not present

## 2015-06-24 DIAGNOSIS — I5030 Unspecified diastolic (congestive) heart failure: Secondary | ICD-10-CM | POA: Diagnosis not present

## 2015-06-24 DIAGNOSIS — J9600 Acute respiratory failure, unspecified whether with hypoxia or hypercapnia: Secondary | ICD-10-CM | POA: Diagnosis not present

## 2015-06-24 DIAGNOSIS — I953 Hypotension of hemodialysis: Secondary | ICD-10-CM | POA: Diagnosis not present

## 2015-06-24 DIAGNOSIS — Z1621 Resistance to vancomycin: Secondary | ICD-10-CM | POA: Diagnosis not present

## 2015-06-24 DIAGNOSIS — F419 Anxiety disorder, unspecified: Secondary | ICD-10-CM | POA: Diagnosis present

## 2015-06-24 DIAGNOSIS — E785 Hyperlipidemia, unspecified: Secondary | ICD-10-CM | POA: Diagnosis present

## 2015-06-24 DIAGNOSIS — A419 Sepsis, unspecified organism: Secondary | ICD-10-CM | POA: Diagnosis not present

## 2015-06-24 DIAGNOSIS — D696 Thrombocytopenia, unspecified: Secondary | ICD-10-CM | POA: Diagnosis not present

## 2015-06-24 DIAGNOSIS — Z8679 Personal history of other diseases of the circulatory system: Secondary | ICD-10-CM | POA: Diagnosis not present

## 2015-06-24 DIAGNOSIS — I5033 Acute on chronic diastolic (congestive) heart failure: Secondary | ICD-10-CM | POA: Diagnosis present

## 2015-06-24 DIAGNOSIS — I959 Hypotension, unspecified: Secondary | ICD-10-CM | POA: Diagnosis present

## 2015-06-24 DIAGNOSIS — R41841 Cognitive communication deficit: Secondary | ICD-10-CM | POA: Diagnosis not present

## 2015-06-24 DIAGNOSIS — Z23 Encounter for immunization: Secondary | ICD-10-CM | POA: Diagnosis not present

## 2015-06-24 DIAGNOSIS — M6281 Muscle weakness (generalized): Secondary | ICD-10-CM | POA: Diagnosis not present

## 2015-06-24 DIAGNOSIS — K219 Gastro-esophageal reflux disease without esophagitis: Secondary | ICD-10-CM | POA: Diagnosis present

## 2015-06-24 DIAGNOSIS — D7582 Heparin induced thrombocytopenia (HIT): Secondary | ICD-10-CM | POA: Diagnosis present

## 2015-06-24 DIAGNOSIS — I132 Hypertensive heart and chronic kidney disease with heart failure and with stage 5 chronic kidney disease, or end stage renal disease: Secondary | ICD-10-CM | POA: Diagnosis present

## 2015-06-24 DIAGNOSIS — R0602 Shortness of breath: Secondary | ICD-10-CM | POA: Diagnosis not present

## 2015-06-24 DIAGNOSIS — D631 Anemia in chronic kidney disease: Secondary | ICD-10-CM | POA: Diagnosis present

## 2015-06-24 MED ORDER — PANTOPRAZOLE SODIUM 40 MG PO TBEC: 40.0000 mg | DELAYED_RELEASE_TABLET | Freq: Two times a day (BID) | ORAL | Status: DC

## 2015-06-24 MED ORDER — HYDRALAZINE HCL 25 MG PO TABS: 25.0000 mg | ORAL_TABLET | Freq: Three times a day (TID) | ORAL | Status: DC

## 2015-06-24 MED ORDER — HYDROCODONE-ACETAMINOPHEN 5-325 MG PO TABS: 1.0000 | ORAL_TABLET | Freq: Four times a day (QID) | ORAL | Status: DC | PRN

## 2015-06-24 MED ORDER — METOPROLOL TARTRATE 25 MG PO TABS: 25.0000 mg | ORAL_TABLET | Freq: Three times a day (TID) | ORAL | Status: DC

## 2015-06-24 MED ORDER — SODIUM CHLORIDE 0.9 % IV SOLN: 6.0000 mg/kg | INTRAVENOUS | Status: DC

## 2015-06-24 MED ORDER — ALUM & MAG HYDROXIDE-SIMETH 200-200-20 MG/5ML PO SUSP: 30.0000 mL | Freq: Four times a day (QID) | ORAL | Status: DC | PRN

## 2015-06-24 MED ORDER — SODIUM CHLORIDE 0.9 % IV SOLN: 500.0000 mg | INTRAVENOUS | Status: DC

## 2015-06-24 MED ORDER — ACETAMINOPHEN 325 MG PO TABS: 650.0000 mg | ORAL_TABLET | Freq: Four times a day (QID) | ORAL | Status: DC | PRN

## 2015-06-24 MED ORDER — DEXTROSE 5 % IV SOLN
2.0000 g | INTRAVENOUS | Status: DC
  Filled 2015-06-24: qty 2

## 2015-06-24 MED ORDER — DEXTROSE 5 % IV SOLN: 1.0000 g | INTRAVENOUS | Status: DC

## 2015-06-24 MED ORDER — ALPRAZOLAM 0.25 MG PO TABS: 0.2500 mg | ORAL_TABLET | Freq: Two times a day (BID) | ORAL | Status: DC | PRN

## 2015-06-24 MED ORDER — SODIUM CHLORIDE 0.9 % IV SOLN
500.0000 mg | INTRAVENOUS | Status: DC
  Filled 2015-06-24: qty 10

## 2015-06-24 MED ORDER — DEXTROSE 5 % IV SOLN: 2.0000 g | INTRAVENOUS | Status: DC

## 2015-06-24 NOTE — Discharge Summary (Signed)
[facility] [place] NAME: [name]    MR#:  [id number]  DATE OF BIRTH:  [birth year]  DATE OF ADMISSION:  06/15/2015 ADMITTING PHYSICIAN: Bettey Costa, MD  DATE OF DISCHARGE: 06/24/2015  PRIMARY CARE PHYSICIAN: Arnette Norris, MD    ADMISSION DIAGNOSIS:  Symptomatic anemia [D64.9]  DISCHARGE DIAGNOSIS:  Active Problems:   Anemia   SECONDARY DIAGNOSIS:   Past Medical History  Diagnosis Date  . Hyperlipidemia   . Hypertension   . Thyroid disease   . GERD (gastroesophageal reflux disease)   . Aneurysm ([place])     abdominal aortic  . STD (sexually transmitted disease)     chlamydia  . VAIN II (vaginal intraepithelial neoplasia grade II) 7/09  . 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 B12 deficiency   . COPD (chronic obstructive pulmonary disease) ([place])   . Renal insufficiency   . Cancer ([place])     skin on left ankle 04/10/15 pt states she has never had cancer  . ESRD (end stage renal disease) ([place])   . Diastolic heart failure [facility])     HOSPITAL COURSE:   65 year old female with past medical history significant for ESRD on Tuesday Thursday Saturday hemodialysis, recent pseudomonal right groin seroma infection status post wound VAC placement, hypertension and history of pneumonia and diastolic CHF exacerbation presents to the hospital from [place] secondary to hypotension during dialysis  #1 hypotension- likely hypovolemic and also from anemia - Improved.   2 .Acute on chronic anemia- anemia of chronic disease. S/[p 2 unts of RBC transfusion.  -Received transfusions during this hospitalization. S/p EGD;showing AVM in the duodenum,clipped. -Hemoglobin is stable and patient has been able to tolerate regular diet well -On Protonix  .#3right groin infected seroma with Pseudomonas and VRE- Patient was receiving Fortaz with dialysis and oral Linezolid prior to admission. However due to her  anemia and thrombocytopenia, concerned that linezolid is causing bone marrow suppression. That has been discontinued and she has been started on IV daptomycin with dialysis. -IV Fortaz and daptomycin will be set up during dialysis. Wound VAC changed prior to discharge showing improvement. -Follow up with ID in 1 week. Continue antibiotics until 07/05/2015   #4 end-stage renal disease with hemodialysis-on Tuesday Thursday Saturday schedule.  Nephrology has been consulted. Dialysis per schedule -We'll receive antibiotics with dialysis for the next 10 days  #5 hypertension- resolved hypotension. Resumed Norvasc at a lower dose and oral hydralazine And metoprolol.  #7 Poor po itnake.. EGD unremarkable for any stricture. Encouraged her to eat. Less dietary restrictions.  -Protonix started   Patient will be discharged back to [place] today    DISCHARGE CONDITIONS:   Guarded  CONSULTS OBTAINED:  Treatment Team:  Katha Cabal, MD Anthonette Legato, MD Manya Silvas, MD Adrian Prows, MD  DRUG ALLERGIES:   Allergies  Allergen Reactions  . Asa [Aspirin] Other (See Comments)    unknown  . 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 Plavix [Clopidogrel] Other (See Comments)    unknown    DISCHARGE MEDICATIONS:   Current Discharge Medication List    START taking these medications   Details  alum & mag hydroxide-simeth (MAALOX/MYLANTA) 200-200-20 MG/5ML suspension Take 30 mLs by mouth every 6 (six) hours as needed for indigestion or heartburn (dyspepsia). Qty: 355 mL, Refills: 0    DAPTOmycin 379 mg in sodium chloride 0.9 % 100 mL Inject  379 mg into the vein every other day. STOP DATE 07/05/15 Can be given with dialysis Qty: 3 g, Refills: 0    pantoprazole (PROTONIX) 40 MG tablet Take 1 tablet (40 mg total) by mouth 2 (two) times daily before a meal. Qty: 60 tablet, Refills: 2      CONTINUE these medications which have CHANGED   Details   acetaminophen (TYLENOL) 325 MG tablet Take 2 tablets (650 mg total) by mouth every 6 (six) hours as needed for mild pain (or Fever >/= 101). Qty: 30 tablet, Refills: 0    ALPRAZolam (XANAX) 0.25 MG tablet Take 1 tablet (0.25 mg total) by mouth 2 (two) times daily as needed for anxiety. Qty: 20 tablet, Refills: 0    cefTAZidime 1 g in dextrose 5 % 50 mL Inject 1 g into the vein Every Tuesday,Thursday,and Saturday with dialysis. STOP DATE 07/05/15 Can be given with dialysis Qty: 5 g, Refills: 0    hydrALAZINE (APRESOLINE) 25 MG tablet Take 1 tablet (25 mg total) by mouth every 8 (eight) hours. Qty: 90 tablet, Refills: 0    HYDROcodone-acetaminophen (NORCO/VICODIN) 5-325 MG tablet Take 1-2 tablets by mouth every 6 (six) hours as needed for moderate pain or severe pain. Qty: 20 tablet, Refills: 0    metoprolol tartrate (LOPRESSOR) 25 MG tablet Take 1 tablet (25 mg total) by mouth every 8 (eight) hours. Qty: 90 tablet, Refills: 0      CONTINUE these medications which have NOT CHANGED   Details  albuterol (PROVENTIL) (2.5 MG/3ML) 0.083% nebulizer solution Take 2.5 mg by nebulization every 6 (six) hours as needed for wheezing.    amLODipine (NORVASC) 5 MG tablet Take 1 tablet (5 mg total) by mouth daily. Qty: 30 tablet, Refills: 6    atorvastatin (LIPITOR) 40 MG tablet Take 40 mg by mouth daily.    budesonide-formoterol (SYMBICORT) 160-4.5 MCG/ACT inhaler Inhale 2 puffs into the lungs 2 (two) times daily.    chlorpheniramine-HYDROcodone (TUSSIONEX) 10-8 MG/5ML SUER Take 5 mLs by mouth every 12 (twelve) hours as needed for cough. Qty: 140 mL, Refills: 0    Docusate Sodium 100 MG capsule Take 100 mg by mouth 2 (two) times daily.    famotidine (PEPCID) 20 MG tablet Take 20 mg by mouth daily.    fluticasone (FLONASE) 50 MCG/ACT nasal spray Place 2 sprays into both nostrils daily.    guaiFENesin-dextromethorphan (ROBITUSSIN DM) 100-10 MG/5ML syrup Take 10 mLs by mouth every 4 (four) hours  as needed for cough. Qty: 118 mL, Refills: 0    levothyroxine (SYNTHROID, LEVOTHROID) 88 MCG tablet Take 88 mcg by mouth daily.    loperamide (IMODIUM A-D) 2 MG tablet Take 2 mg by mouth every 4 (four) hours as needed for diarrhea or loose stools.    magnesium hydroxide (MILK OF MAGNESIA) 400 MG/5ML suspension Take 30 mLs by mouth daily as needed for mild constipation.    multivitamin (RENA-VIT) TABS tablet Take 1 tablet by mouth daily.    ondansetron (ZOFRAN) 4 MG tablet Take 4 mg by mouth every 6 (six) hours as needed for nausea or vomiting.    sodium chloride (OCEAN) 0.65 % SOLN nasal spray Place 1 spray into both nostrils 5 (five) times daily.      STOP taking these medications     aspirin EC 81 MG tablet      irbesartan (AVAPRO) 75 MG tablet      linezolid (ZYVOX) 600 MG tablet      promethazine (PHENERGAN) 25 MG/ML injection  docusate sodium (COLACE) 100 MG capsule      fluticasone furoate-vilanterol (BREO ELLIPTA) 100-25 MCG/INH AEPB      loperamide (IMODIUM) 2 MG capsule          DISCHARGE INSTRUCTIONS:   1. For dialysis tomorrow per schedule 2. ID follow-up in 1 week 3. PCP follow-up in 2 weeks  If you experience worsening of your admission symptoms, develop shortness of breath, life threatening emergency, suicidal or homicidal thoughts you must seek medical attention immediately by calling 911 or calling your MD immediately  if symptoms less severe.  You Must read complete instructions/literature along with all the possible adverse reactions/side effects for all the Medicines you take and that have been prescribed to you. Take any new Medicines after you have completely understood and accept all the possible adverse reactions/side effects.   Please note  You were cared for by a hospitalist during your hospital stay. If you have any questions about your discharge medications or the care you received while you were in the hospital after you are discharged,  you can call the unit and asked to speak with the hospitalist on call if the hospitalist that took care of you is not available. Once you are discharged, your primary care physician will handle any further medical issues. Please note that NO REFILLS for any discharge medications will be authorized once you are discharged, as it is imperative that you return to your primary care physician (or establish a relationship with a primary care physician if you do not have one) for your aftercare needs so that they can reassess your need for medications and monitor your lab values.    Today   CHIEF COMPLAINT:   Chief Complaint  Patient presents with  . Hypotension  . Chest Pain    VITAL SIGNS:  Blood pressure 157/66, pulse 74, temperature 97.7 F (36.5 C), temperature source Oral, resp. rate 17, height 5\' 4"  (1.626 m), weight 61.8 kg (136 lb 3.9 oz), last menstrual period 03/30/1988, SpO2 95 %.  I/O:   Intake/Output Summary (Last 24 hours) at 06/24/15 1303 Last data filed at 06/24/15 0920  Gross per 24 hour  Intake    360 ml  Output   1251 ml  Net   -891 ml    PHYSICAL EXAMINATION:   Physical Exam   GENERAL: 65 y.o.-year-old patient lying in the bed with no acute distress.  EYES: Pupils equal, round, reactive to light and accommodation. No scleral icterus. Extraocular muscles intact.  HEENT: Head atraumatic, normocephalic. Oropharynx and nasopharynx clear.  NECK: Supple, no jugular venous distention. No thyroid enlargement, no tenderness.  LUNGS: Normal breath sounds bilaterally, no wheezing, rales,rhonchi or crepitation. No use of accessory muscles of respiration. Decreased bibasilar breath sounds CARDIOVASCULAR: S1, S2 normal. No murmurs, rubs, or gallops.  ABDOMEN: Soft, nontender, nondistended. Bowel sounds present. No organomegaly or mass.  EXTREMITIES: No pedal edema, cyanosis, or clubbing. Right groin wound vac in place, granulation tissue noted per wound nurse. no  evidence of infection, erythema otherwise. NEUROLOGIC: Cranial nerves II through XII are intact. Muscle strength 5/5 in all extremities. Sensation intact. Gait not checked.  PSYCHIATRIC: The patient is alert and oriented x 3. SKIN: right groin wound vac as mentioned above  DATA REVIEW:   CBC  Recent Labs Lab 06/20/15 0403  06/21/15 2031  WBC 3.7  --   --   HGB 7.0*  < > 8.6*  HCT 20.7*  < > 25.4*  PLT 60*  --   --   < > =  values in this interval not displayed.  Chemistries   Recent Labs Lab 06/18/15 0637  06/23/15 0310  NA  --   < > 135  K  --   < > 3.4*  CL  --   < > 101  CO2  --   < > 28  GLUCOSE  --   < > 90  BUN  --   < > 29*  CREATININE  --   < > 3.90*  CALCIUM  --   < > 8.5*  MG 1.9  --   --   < > = values in this interval not displayed.  Cardiac Enzymes No results for input(s): TROPONINI in the last 168 hours.  Microbiology Results  Results for orders placed or performed during the hospital encounter of 05/20/15  Wound culture     Status: None   Collection Time: 05/20/15 12:25 PM  Result Value Ref Range Status   Specimen Description GROIN RIGHT  Final   Special Requests NONE  Final   Gram Stain   Final    FEW WBC SEEN FEW GRAM NEGATIVE RODS RARE GRAM POSITIVE COCCI    Culture   Final    RARE PSEUDOMONAS AERUGINOSA LIGHT GROWTH VANCOMYCIN RESISTANT ENTEROCOCCUS    Report Status 05/25/2015 FINAL  Final   Organism ID, Bacteria PSEUDOMONAS AERUGINOSA  Final   Organism ID, Bacteria VANCOMYCIN RESISTANT ENTEROCOCCUS  Final      Susceptibility   Pseudomonas aeruginosa - MIC*    CEFTAZIDIME 4 SENSITIVE Sensitive     CIPROFLOXACIN 1 SENSITIVE Sensitive     GENTAMICIN <=1 SENSITIVE Sensitive     IMIPENEM 1 SENSITIVE Sensitive     CEFEPIME 4 SENSITIVE Sensitive     * RARE PSEUDOMONAS AERUGINOSA   Vancomycin resistant enterococcus - MIC*    AMPICILLIN Value in next row Resistant      RESISTANT>=32    VANCOMYCIN Value in next row Resistant       RESISTANT>=32    GENTAMICIN SYNERGY Value in next row Sensitive      RESISTANT>=32    LINEZOLID Value in next row Sensitive      SENSITIVE2    * LIGHT GROWTH VANCOMYCIN RESISTANT ENTEROCOCCUS  MRSA PCR Screening     Status: None   Collection Time: 05/20/15 12:30 PM  Result Value Ref Range Status   MRSA by PCR NEGATIVE NEGATIVE Final    Comment:        The GeneXpert MRSA Assay (FDA approved for NASAL specimens only), is one component of a comprehensive MRSA colonization surveillance program. It is not intended to diagnose MRSA infection nor to guide or monitor treatment for MRSA infections.   Anaerobic culture     Status: None   Collection Time: 05/24/15  8:31 PM  Result Value Ref Range Status   Specimen Description GROIN RIGHT  Final   Special Requests NONE  Final   Culture NO ANAEROBES ISOLATED  Final   Report Status 05/28/2015 FINAL  Final  Wound culture     Status: None   Collection Time: 05/24/15  9:13 PM  Result Value Ref Range Status   Specimen Description GROIN RIGHT  Final   Special Requests NONE  Final   Gram Stain FEW WBC SEEN MANY GRAM POSITIVE COCCI IN PAIRS   Final   Culture HEAVY GROWTH VANCOMYCIN RESISTANT ENTEROCOCCUS  Final   Report Status 05/27/2015 FINAL  Final   Organism ID, Bacteria VANCOMYCIN RESISTANT ENTEROCOCCUS  Final  Susceptibility   Vancomycin resistant enterococcus - MIC*    AMPICILLIN >=32 RESISTANT Resistant     VANCOMYCIN >=32 RESISTANT Resistant     GENTAMICIN SYNERGY SENSITIVE Sensitive     LINEZOLID 2 SENSITIVE Sensitive     * HEAVY GROWTH VANCOMYCIN RESISTANT ENTEROCOCCUS  Body fluid culture     Status: None   Collection Time: 05/31/15 10:57 AM  Result Value Ref Range Status   Specimen Description CYTOPLEU  Final   Special Requests NONE  Final   Gram Stain   Final    MODERATE RED BLOOD CELLS FEW WBC SEEN NO ORGANISMS SEEN    Culture No growth aerobically or anaerobically.  Final   Report Status 06/06/2015 FINAL  Final     RADIOLOGY:  Dg Chest 1 View  06/23/2015  CLINICAL DATA:  Hypotension during dialysis. Shortness of breath while eating. Chest discomfort. EXAM: CHEST 1 VIEW COMPARISON:  06/15/2015. FINDINGS: Cardiomegaly. Dual lumen dialysis catheter stable with no pneumothorax. Increasing RIGHT base opacity representing possible pleural effusion. No consolidation or lobar atelectasis. IMPRESSION: Suspected increasing pleural effusion on the RIGHT. No consolidation or edema. Electronically Signed   By: Staci Righter M.D.   On: 06/23/2015 13:46    EKG:   Orders placed or performed during the hospital encounter of 06/15/15  . ED EKG  . ED EKG  . EKG 12-Lead  . EKG 12-Lead      Management plans discussed with the patient, family and they are in agreement.  CODE STATUS:     Code Status Orders        Start     Ordered   06/15/15 1836  Full code   Continuous     06/15/15 1837    Code Status History    Date Active Date Inactive Code Status Order ID Comments User Context   05/20/2015 11:13 AM 06/01/2015  9:21 PM Full Code ID:[id number]  Fritzi Mandes, MD Inpatient   04/09/2015 10:24 AM 04/22/2015  9:15 PM Full Code [id number]  Theodoro Grist, MD Inpatient      TOTAL TIME TAKING CARE OF THIS PATIENT: 38 minutes.    Gladstone Lighter M.D on 06/24/2015 at 1:03 PM  Between 7am to 6pm - Pager - [phone]  After 6pm go to [email or web address] - password [email or web address] [facility]  Office  [phone]  CC: Primary care physician; Arnette Norris, MD

## 2015-06-24 NOTE — Progress Notes (Signed)
06/24/2015 4:29 PM  BP 154/68 mmHg  Pulse 89  Temp(Src) 98 F (36.7 C) (Oral)  Resp 17  Ht 5\' 4"  (1.626 m)  Wt 61.8 kg (136 lb 3.9 oz)  BMI 23.37 kg/m2  SpO2 94%  LMP 03/30/1988 Patient discharged per MD orders. Report called to [employer] to [place], Therapist, sports. IV removed per policy. Wound vac disconnected for discharge.  Discharged via stretcher escorted by EMS.  Almedia Balls, RN

## 2015-06-24 NOTE — Progress Notes (Signed)
Pharmacy Antibiotic Note  [name] is a 65 y.o. female with ESRD on HD admitted on [id number] with a complicated vascular history . She is currently dealing with a seroma in her R groin,and was admitted last month when this was draining purulent material and with surrounding redness, pain and induration .Culture from the R wound grew pseudomonas and VRE, she underwent I and D 2/24 by Dr Delana Meyer and was discharged 3/4 on IV ceftazidime at HD and PO linezolid. Now on daptomycin due to bone marrow suppression on linezolid.  Pharmacy is consulted to dose daptomycin.   Plan: Daptomycin 6 mg/kg IV Q48H-(Hemodialysis dosing ) 3/21 -Baseline CK 25  Recommend holding statin for duration of daptomycin and montoring CK at least weekly.  (Patient is also on Ceftazidime-Per ID plan 6 weeks tx  from surgery on 2/24)  Height: 5\' 4"  (162.6 cm) Weight: 136 lb 3.9 oz (61.8 kg) IBW/kg (Calculated) : 54.7  Temp (24hrs), Avg:98 F (36.7 C), Min:97.7 F (36.5 C), Max:98.2 F (36.8 C)   Recent Labs Lab 06/18/15 0637 06/20/15 0403 06/23/15 0310  WBC 5.0 3.7  --   CREATININE  --  3.31* 3.90*    Estimated Creatinine Clearance: 12.3 mL/min (by C-G formula based on Cr of 3.9).    Allergies  Allergen Reactions  . Asa [Aspirin] Other (See Comments)    unknown  . 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 Plavix [Clopidogrel] Other (See Comments)    unknown    Antimicrobials this admission: 3/21 ceftazidime >>  3/21 daptomycin >>   Dose adjustments this admission:   Microbiology results:  BCx:   UCx:    Sputum:    MRSA PCR:   Thank you for allowing pharmacy to be a part of this patient's care.  Kass Herberger C 06/24/2015 10:39 AM

## 2015-06-24 NOTE — Progress Notes (Signed)
[facility]  ROUNDING NOTE   Subjective:   Friend at bedside.   Objective:  Vital signs in last 24 hours:  Temp:  [97.7 F (36.5 C)-98.2 F (36.8 C)] 97.7 F (36.5 C) (03/27 0840) Pulse Rate:  [74-79] 74 (03/27 0840) Resp:  [16-20] 17 (03/27 0840) BP: (138-160)/(61-66) 157/66 mmHg (03/27 0840) SpO2:  [95 %-97 %] 95 % (03/27 0840) Weight:  [61.8 kg (136 lb 3.9 oz)] 61.8 kg (136 lb 3.9 oz) (03/26 1600)  Weight change:  Filed Weights   06/20/15 1828 06/23/15 1230 06/23/15 1600  Weight: 61.1 kg (134 lb 11.2 oz) 63.6 kg (140 lb 3.4 oz) 61.8 kg (136 lb 3.9 oz)    Intake/Output: I/O last 3 completed shifts: In: 1070.6 [P.O.:960; I.V.:3; IV Piggyback:107.6] Out: 1153 [Urine:151; Other:1000; Stool:2]   Intake/Output this shift:  Total I/O In: -  Out: 100 [Urine:100]  Physical Exam: General: NAD, resting in bed  Head: Normocephalic, atraumatic. Moist oral mucosal membranes  Eyes: Anicteric  Neck: Supple, trachea midline  Lungs:  Clear to auscultation normal effort  Heart: S1S2 no rubs  Abdomen:  Soft, nontender, BS present  Extremities: Trace LE edema, wound vac in right groin  Neurologic: Nonfocal, moving all four extremities  Skin: No lesions  Access: R IJ PC    Basic Metabolic Panel:  Recent Labs Lab 06/18/15 0637 06/18/15 1531 06/20/15 0403 06/23/15 0310  NA  --   --  134* 135  K  --   --  3.1* 3.4*  CL  --   --  98* 101  CO2  --   --  26 28  GLUCOSE  --   --  65 90  BUN  --   --  40* 29*  CREATININE  --   --  3.31* 3.90*  CALCIUM  --   --  8.4* 8.5*  MG 1.9  --   --   --   PHOS  --  2.2* 4.6  --     Liver Function Tests:  Recent Labs Lab 06/20/15 0403  ALBUMIN 2.2*   No results for input(s): LIPASE, AMYLASE in the last 168 hours. No results for input(s): AMMONIA in the last 168 hours.  CBC:  Recent Labs Lab 06/18/15 0637 06/20/15 0403 06/21/15 1400 06/21/15 2031  WBC 5.0 3.7  --   --   HGB 7.4* 7.0* 7.5* 8.6*  HCT 21.4*  20.7* 22.2* 25.4*  MCV 87.1 87.5  --   --   PLT 97* 60*  --   --     Cardiac Enzymes:  Recent Labs Lab 06/18/15 2144  CKTOTAL 25*    BNP: Invalid input(s): POCBNP  CBG: No results for input(s): GLUCAP in the last 168 hours.  Microbiology: Results for orders placed or performed during the hospital encounter of 05/20/15  Wound culture     Status: None   Collection Time: 05/20/15 12:25 PM  Result Value Ref Range Status   Specimen Description GROIN RIGHT  Final   Special Requests NONE  Final   Gram Stain   Final    FEW WBC SEEN FEW GRAM NEGATIVE RODS RARE GRAM POSITIVE COCCI    Culture   Final    RARE PSEUDOMONAS AERUGINOSA LIGHT GROWTH VANCOMYCIN RESISTANT ENTEROCOCCUS    Report Status 05/25/2015 FINAL  Final   Organism ID, Bacteria PSEUDOMONAS AERUGINOSA  Final   Organism ID, Bacteria VANCOMYCIN RESISTANT ENTEROCOCCUS  Final      Susceptibility   Pseudomonas aeruginosa - MIC*  CEFTAZIDIME 4 SENSITIVE Sensitive     CIPROFLOXACIN 1 SENSITIVE Sensitive     GENTAMICIN <=1 SENSITIVE Sensitive     IMIPENEM 1 SENSITIVE Sensitive     CEFEPIME 4 SENSITIVE Sensitive     * RARE PSEUDOMONAS AERUGINOSA   Vancomycin resistant enterococcus - MIC*    AMPICILLIN Value in next row Resistant      RESISTANT>=32    VANCOMYCIN Value in next row Resistant      RESISTANT>=32    GENTAMICIN SYNERGY Value in next row Sensitive      RESISTANT>=32    LINEZOLID Value in next row Sensitive      SENSITIVE2    * LIGHT GROWTH VANCOMYCIN RESISTANT ENTEROCOCCUS  MRSA PCR Screening     Status: None   Collection Time: 05/20/15 12:30 PM  Result Value Ref Range Status   MRSA by PCR NEGATIVE NEGATIVE Final    Comment:        The GeneXpert MRSA Assay (FDA approved for NASAL specimens only), is one component of a comprehensive MRSA colonization surveillance program. It is not intended to diagnose MRSA infection nor to guide or monitor treatment for MRSA infections.   Anaerobic culture      Status: None   Collection Time: 05/24/15  8:31 PM  Result Value Ref Range Status   Specimen Description GROIN RIGHT  Final   Special Requests NONE  Final   Culture NO ANAEROBES ISOLATED  Final   Report Status 05/28/2015 FINAL  Final  Wound culture     Status: None   Collection Time: 05/24/15  9:13 PM  Result Value Ref Range Status   Specimen Description GROIN RIGHT  Final   Special Requests NONE  Final   Gram Stain FEW WBC SEEN MANY GRAM POSITIVE COCCI IN PAIRS   Final   Culture HEAVY GROWTH VANCOMYCIN RESISTANT ENTEROCOCCUS  Final   Report Status 05/27/2015 FINAL  Final   Organism ID, Bacteria VANCOMYCIN RESISTANT ENTEROCOCCUS  Final      Susceptibility   Vancomycin resistant enterococcus - MIC*    AMPICILLIN >=32 RESISTANT Resistant     VANCOMYCIN >=32 RESISTANT Resistant     GENTAMICIN SYNERGY SENSITIVE Sensitive     LINEZOLID 2 SENSITIVE Sensitive     * HEAVY GROWTH VANCOMYCIN RESISTANT ENTEROCOCCUS  Body fluid culture     Status: None   Collection Time: 05/31/15 10:57 AM  Result Value Ref Range Status   Specimen Description CYTOPLEU  Final   Special Requests NONE  Final   Gram Stain   Final    MODERATE RED BLOOD CELLS FEW WBC SEEN NO ORGANISMS SEEN    Culture No growth aerobically or anaerobically.  Final   Report Status 06/06/2015 FINAL  Final    Coagulation Studies: No results for input(s): LABPROT, INR in the last 72 hours.  Urinalysis: No results for input(s): COLORURINE, LABSPEC, PHURINE, GLUCOSEU, HGBUR, BILIRUBINUR, KETONESUR, PROTEINUR, UROBILINOGEN, NITRITE, LEUKOCYTESUR in the last 72 hours.  Invalid input(s): APPERANCEUR    Imaging: Dg Chest 1 View  06/23/2015  CLINICAL DATA:  Hypotension during dialysis. Shortness of breath while eating. Chest discomfort. EXAM: CHEST 1 VIEW COMPARISON:  06/15/2015. FINDINGS: Cardiomegaly. Dual lumen dialysis catheter stable with no pneumothorax. Increasing RIGHT base opacity representing possible pleural effusion. No  consolidation or lobar atelectasis. IMPRESSION: Suspected increasing pleural effusion on the RIGHT. No consolidation or edema. Electronically Signed   By: Staci Righter M.D.   On: 06/23/2015 13:46     Medications:     .  amLODipine  5 mg Oral Daily  . ceftAZIDime (FORTAZ) IVPB 1 gram/50 mL D5W (Pyxis)  1 g Intravenous Q T,Th,Sa-HD  . DAPTOmycin (CUBICIN)  IV  6 mg/kg Intravenous Q48H  . docusate sodium  100 mg Oral BID  . famotidine  20 mg Oral Daily  . fluticasone  2 spray Each Nare Daily  . fluticasone furoate-vilanterol  1 puff Inhalation Daily  . hydrALAZINE  25 mg Oral 3 times per day  . Influenza vac split quadrivalent PF  0.5 mL Intramuscular Tomorrow-1000  . levothyroxine  88 mcg Oral QAC breakfast  . metoprolol  25 mg Oral 3 times per day  . multivitamin  1 tablet Oral Daily  . pantoprazole  40 mg Oral BID AC  . sodium chloride  1 spray Each Nare 5 X Daily  . sodium chloride flush  3 mL Intravenous Q12H   sodium chloride, sodium chloride, sodium chloride, acetaminophen **OR** acetaminophen, albuterol, ALPRAZolam, alteplase, alum & mag hydroxide-simeth, anticoagulant sodium citrate, HYDROcodone-acetaminophen, lidocaine (PF), lidocaine-prilocaine, loperamide, magnesium hydroxide, ondansetron **OR** ondansetron (ZOFRAN) IV, pentafluoroprop-tetrafluoroeth, promethazine, senna-docusate, sodium chloride flush  Assessment/ Plan:  65 y.o. female with COPD, hypertension, hyperlipidemia, vaginal intraepithelial neoplasia, emergent AAA repair status post rupture on November 18, left to right femoral to femoral bypass , acute renal failure leading to permanent dialysis, significant 3 vessel disease, HIT positive, thrombocytopenia.  [facility] TTS [place]  1.  ESRD on HD:  Continue TTS schedule. Next treatment for tomorrow.   2.  Anemia of CKD: status post 3 units PRBC this admission.  -  Duodenal AVM   3.  Secondary Hyperparathyroidism: not currently on binders. PTH low  at 21 on 06/18/15  4.  Thrombocytopenia:  HIT positive, pack catheter with citrate   5. Right groin infection: pseudomonas - Daptomycin and ceftazidime until 4/7.     LOS: 9 Elowyn Raupp 3/27/20171:58 PM

## 2015-06-24 NOTE — Consult Note (Signed)
WOC wound consult note Wound type:Recent debridement surgical wound to right inguinal fold. VAC dressing change.  Measurement:4 cm x 3 cm x 1 cm (impressive granulation has developed) Wound [id number], granulating. Clear serous drainage.  Drainage (amount, consistency, odor) Clear, serous. No odor.  Periwound:Intact Dressing procedure/placement/frequency:Dressing change per MD order. Plans to discharge to rehab today . Wound bed is granulating and improving. Will not follow at this time. Please re-consult if needed.  Domenic Moras RN BSN [place] Pager [phone]

## 2015-06-25 DIAGNOSIS — Z298 Encounter for other specified prophylactic measures: Secondary | ICD-10-CM | POA: Diagnosis not present

## 2015-06-25 DIAGNOSIS — N186 End stage renal disease: Secondary | ICD-10-CM | POA: Diagnosis not present

## 2015-06-25 DIAGNOSIS — Z23 Encounter for immunization: Secondary | ICD-10-CM | POA: Diagnosis not present

## 2015-06-25 DIAGNOSIS — T814XXA Infection following a procedure, initial encounter: Secondary | ICD-10-CM | POA: Diagnosis not present

## 2015-06-25 DIAGNOSIS — R7881 Bacteremia: Secondary | ICD-10-CM | POA: Diagnosis not present

## 2015-06-25 DIAGNOSIS — D509 Iron deficiency anemia, unspecified: Secondary | ICD-10-CM | POA: Diagnosis not present

## 2015-06-26 ENCOUNTER — Telehealth: Payer: Self-pay | Admitting: *Deleted

## 2015-06-26 NOTE — Telephone Encounter (Signed)
Transitional care call attempted.  No answer, no voice mail to leave a message.  Will continue attempts to contact patient. 

## 2015-06-27 DIAGNOSIS — Z298 Encounter for other specified prophylactic measures: Secondary | ICD-10-CM | POA: Diagnosis not present

## 2015-06-27 DIAGNOSIS — N186 End stage renal disease: Secondary | ICD-10-CM | POA: Diagnosis not present

## 2015-06-27 DIAGNOSIS — Z23 Encounter for immunization: Secondary | ICD-10-CM | POA: Diagnosis not present

## 2015-06-27 DIAGNOSIS — R7881 Bacteremia: Secondary | ICD-10-CM | POA: Diagnosis not present

## 2015-06-27 DIAGNOSIS — D509 Iron deficiency anemia, unspecified: Secondary | ICD-10-CM | POA: Diagnosis not present

## 2015-06-27 DIAGNOSIS — T814XXA Infection following a procedure, initial encounter: Secondary | ICD-10-CM | POA: Diagnosis not present

## 2015-06-27 NOTE — Telephone Encounter (Signed)
Patient's husband returned call.  Patient was discharged to [facility] for rehab.  Length of stay is unknown at this time.  Follow up scheduled here for 4/4 was canceled.  Will plan to follow up once patient is discharged to home.

## 2015-06-27 NOTE — Telephone Encounter (Signed)
Noted! Thank you

## 2015-06-28 ENCOUNTER — Ambulatory Visit: Admit: 2015-06-28 | Payer: Medicare Other | Admitting: Vascular Surgery

## 2015-06-28 DIAGNOSIS — Q2733 Arteriovenous malformation of digestive system vessel: Secondary | ICD-10-CM | POA: Diagnosis not present

## 2015-06-28 DIAGNOSIS — J189 Pneumonia, unspecified organism: Secondary | ICD-10-CM | POA: Diagnosis not present

## 2015-06-28 DIAGNOSIS — A419 Sepsis, unspecified organism: Secondary | ICD-10-CM | POA: Diagnosis not present

## 2015-06-28 DIAGNOSIS — S301XXD Contusion of abdominal wall, subsequent encounter: Secondary | ICD-10-CM | POA: Diagnosis not present

## 2015-06-28 DIAGNOSIS — Z992 Dependence on renal dialysis: Secondary | ICD-10-CM | POA: Diagnosis not present

## 2015-06-28 DIAGNOSIS — D500 Iron deficiency anemia secondary to blood loss (chronic): Secondary | ICD-10-CM | POA: Diagnosis not present

## 2015-06-28 DIAGNOSIS — N186 End stage renal disease: Secondary | ICD-10-CM | POA: Diagnosis not present

## 2015-06-28 SURGERY — INSERTION OF ARTERIOVENOUS (AV) GORE-TEX GRAFT ARM
Anesthesia: General | Laterality: Left

## 2015-06-29 DIAGNOSIS — N186 End stage renal disease: Secondary | ICD-10-CM | POA: Diagnosis not present

## 2015-06-29 DIAGNOSIS — R7881 Bacteremia: Secondary | ICD-10-CM | POA: Diagnosis not present

## 2015-06-29 DIAGNOSIS — Z992 Dependence on renal dialysis: Secondary | ICD-10-CM | POA: Diagnosis not present

## 2015-07-01 ENCOUNTER — Ambulatory Visit: Payer: Medicare Other | Admitting: Family Medicine

## 2015-07-01 DIAGNOSIS — A491 Streptococcal infection, unspecified site: Secondary | ICD-10-CM | POA: Diagnosis not present

## 2015-07-01 DIAGNOSIS — Z1621 Resistance to vancomycin: Secondary | ICD-10-CM | POA: Diagnosis not present

## 2015-07-01 DIAGNOSIS — Z792 Long term (current) use of antibiotics: Secondary | ICD-10-CM | POA: Diagnosis not present

## 2015-07-01 DIAGNOSIS — N186 End stage renal disease: Secondary | ICD-10-CM | POA: Diagnosis not present

## 2015-07-01 DIAGNOSIS — T827XXD Infection and inflammatory reaction due to other cardiac and vascular devices, implants and grafts, subsequent encounter: Secondary | ICD-10-CM | POA: Insufficient documentation

## 2015-07-02 DIAGNOSIS — Z992 Dependence on renal dialysis: Secondary | ICD-10-CM | POA: Diagnosis not present

## 2015-07-02 DIAGNOSIS — N186 End stage renal disease: Secondary | ICD-10-CM | POA: Diagnosis not present

## 2015-07-02 DIAGNOSIS — R7881 Bacteremia: Secondary | ICD-10-CM | POA: Diagnosis not present

## 2015-07-04 DIAGNOSIS — Z992 Dependence on renal dialysis: Secondary | ICD-10-CM | POA: Diagnosis not present

## 2015-07-04 DIAGNOSIS — R7881 Bacteremia: Secondary | ICD-10-CM | POA: Diagnosis not present

## 2015-07-04 DIAGNOSIS — N186 End stage renal disease: Secondary | ICD-10-CM | POA: Diagnosis not present

## 2015-07-05 ENCOUNTER — Inpatient Hospital Stay: Payer: Medicare Other

## 2015-07-05 ENCOUNTER — Inpatient Hospital Stay: Payer: Medicare Other | Admitting: Hematology and Oncology

## 2015-07-05 ENCOUNTER — Other Ambulatory Visit: Payer: Self-pay

## 2015-07-05 DIAGNOSIS — D649 Anemia, unspecified: Secondary | ICD-10-CM

## 2015-07-06 DIAGNOSIS — N186 End stage renal disease: Secondary | ICD-10-CM | POA: Diagnosis not present

## 2015-07-06 DIAGNOSIS — R7881 Bacteremia: Secondary | ICD-10-CM | POA: Diagnosis not present

## 2015-07-06 DIAGNOSIS — Z992 Dependence on renal dialysis: Secondary | ICD-10-CM | POA: Diagnosis not present

## 2015-07-09 DIAGNOSIS — N186 End stage renal disease: Secondary | ICD-10-CM | POA: Diagnosis not present

## 2015-07-09 DIAGNOSIS — R7881 Bacteremia: Secondary | ICD-10-CM | POA: Diagnosis not present

## 2015-07-09 DIAGNOSIS — Z992 Dependence on renal dialysis: Secondary | ICD-10-CM | POA: Diagnosis not present

## 2015-07-11 DIAGNOSIS — R7881 Bacteremia: Secondary | ICD-10-CM | POA: Diagnosis not present

## 2015-07-11 DIAGNOSIS — N186 End stage renal disease: Secondary | ICD-10-CM | POA: Diagnosis not present

## 2015-07-11 DIAGNOSIS — Z992 Dependence on renal dialysis: Secondary | ICD-10-CM | POA: Diagnosis not present

## 2015-07-13 DIAGNOSIS — N186 End stage renal disease: Secondary | ICD-10-CM | POA: Diagnosis not present

## 2015-07-13 DIAGNOSIS — R7881 Bacteremia: Secondary | ICD-10-CM | POA: Diagnosis not present

## 2015-07-13 DIAGNOSIS — Z992 Dependence on renal dialysis: Secondary | ICD-10-CM | POA: Diagnosis not present

## 2015-07-15 ENCOUNTER — Emergency Department: Payer: Medicare Other

## 2015-07-15 ENCOUNTER — Inpatient Hospital Stay
Admission: EM | Admit: 2015-07-15 | Discharge: 2015-07-23 | DRG: 291 | Disposition: A | Discharge status: Skilled Nursing Facility | Payer: Medicare Other | Attending: Internal Medicine | Admitting: Internal Medicine

## 2015-07-15 DIAGNOSIS — J9621 Acute and chronic respiratory failure with hypoxia: Secondary | ICD-10-CM | POA: Diagnosis present

## 2015-07-15 DIAGNOSIS — Z9981 Dependence on supplemental oxygen: Secondary | ICD-10-CM

## 2015-07-15 DIAGNOSIS — Z8049 Family history of malignant neoplasm of other genital organs: Secondary | ICD-10-CM

## 2015-07-15 DIAGNOSIS — I132 Hypertensive heart and chronic kidney disease with heart failure and with stage 5 chronic kidney disease, or end stage renal disease: Secondary | ICD-10-CM | POA: Diagnosis not present

## 2015-07-15 DIAGNOSIS — I5033 Acute on chronic diastolic (congestive) heart failure: Secondary | ICD-10-CM | POA: Diagnosis present

## 2015-07-15 DIAGNOSIS — D7582 Heparin induced thrombocytopenia (HIT): Secondary | ICD-10-CM | POA: Diagnosis present

## 2015-07-15 DIAGNOSIS — E8770 Fluid overload, unspecified: Secondary | ICD-10-CM | POA: Diagnosis present

## 2015-07-15 DIAGNOSIS — E8779 Other fluid overload: Secondary | ICD-10-CM

## 2015-07-15 DIAGNOSIS — Z823 Family history of stroke: Secondary | ICD-10-CM

## 2015-07-15 DIAGNOSIS — N186 End stage renal disease: Secondary | ICD-10-CM | POA: Diagnosis present

## 2015-07-15 DIAGNOSIS — I120 Hypertensive chronic kidney disease with stage 5 chronic kidney disease or end stage renal disease: Secondary | ICD-10-CM | POA: Diagnosis not present

## 2015-07-15 DIAGNOSIS — K31819 Angiodysplasia of stomach and duodenum without bleeding: Secondary | ICD-10-CM | POA: Diagnosis present

## 2015-07-15 DIAGNOSIS — I959 Hypotension, unspecified: Secondary | ICD-10-CM | POA: Diagnosis not present

## 2015-07-15 DIAGNOSIS — Z992 Dependence on renal dialysis: Secondary | ICD-10-CM

## 2015-07-15 DIAGNOSIS — N2581 Secondary hyperparathyroidism of renal origin: Secondary | ICD-10-CM | POA: Diagnosis not present

## 2015-07-15 DIAGNOSIS — E785 Hyperlipidemia, unspecified: Secondary | ICD-10-CM | POA: Diagnosis present

## 2015-07-15 DIAGNOSIS — J9601 Acute respiratory failure with hypoxia: Secondary | ICD-10-CM

## 2015-07-15 DIAGNOSIS — E861 Hypovolemia: Secondary | ICD-10-CM | POA: Diagnosis not present

## 2015-07-15 DIAGNOSIS — Z87891 Personal history of nicotine dependence: Secondary | ICD-10-CM

## 2015-07-15 DIAGNOSIS — J449 Chronic obstructive pulmonary disease, unspecified: Secondary | ICD-10-CM | POA: Diagnosis present

## 2015-07-15 DIAGNOSIS — Z792 Long term (current) use of antibiotics: Secondary | ICD-10-CM

## 2015-07-15 DIAGNOSIS — J811 Chronic pulmonary edema: Secondary | ICD-10-CM | POA: Diagnosis not present

## 2015-07-15 DIAGNOSIS — Z79899 Other long term (current) drug therapy: Secondary | ICD-10-CM

## 2015-07-15 DIAGNOSIS — J9602 Acute respiratory failure with hypercapnia: Secondary | ICD-10-CM

## 2015-07-15 DIAGNOSIS — Z8249 Family history of ischemic heart disease and other diseases of the circulatory system: Secondary | ICD-10-CM

## 2015-07-15 DIAGNOSIS — L7634 Postprocedural seroma of skin and subcutaneous tissue following other procedure: Secondary | ICD-10-CM | POA: Diagnosis present

## 2015-07-15 DIAGNOSIS — Z8679 Personal history of other diseases of the circulatory system: Secondary | ICD-10-CM

## 2015-07-15 DIAGNOSIS — F419 Anxiety disorder, unspecified: Secondary | ICD-10-CM | POA: Diagnosis present

## 2015-07-15 DIAGNOSIS — B9689 Other specified bacterial agents as the cause of diseases classified elsewhere: Secondary | ICD-10-CM | POA: Diagnosis present

## 2015-07-15 DIAGNOSIS — Y832 Surgical operation with anastomosis, bypass or graft as the cause of abnormal reaction of the patient, or of later complication, without mention of misadventure at the time of the procedure: Secondary | ICD-10-CM | POA: Diagnosis present

## 2015-07-15 DIAGNOSIS — J9600 Acute respiratory failure, unspecified whether with hypoxia or hypercapnia: Secondary | ICD-10-CM | POA: Diagnosis not present

## 2015-07-15 DIAGNOSIS — K219 Gastro-esophageal reflux disease without esophagitis: Secondary | ICD-10-CM | POA: Diagnosis present

## 2015-07-15 DIAGNOSIS — Z7951 Long term (current) use of inhaled steroids: Secondary | ICD-10-CM

## 2015-07-15 DIAGNOSIS — J810 Acute pulmonary edema: Secondary | ICD-10-CM

## 2015-07-15 DIAGNOSIS — E039 Hypothyroidism, unspecified: Secondary | ICD-10-CM | POA: Diagnosis present

## 2015-07-15 DIAGNOSIS — D631 Anemia in chronic kidney disease: Secondary | ICD-10-CM | POA: Diagnosis present

## 2015-07-15 LAB — CBC WITH DIFFERENTIAL/PLATELET
BASOS ABS: 0.2 10*3/uL — AB (ref 0–0.1)
BASOS PCT: 1 %
Eosinophils Absolute: 1.1 10*3/uL — ABNORMAL HIGH (ref 0–0.7)
Eosinophils Relative: 7 %
HEMATOCRIT: 27.4 % — AB (ref 35.0–47.0)
HEMOGLOBIN: 9.2 g/dL — AB (ref 12.0–16.0)
Lymphocytes Relative: 31 %
Lymphs Abs: 4.8 10*3/uL — ABNORMAL HIGH (ref 1.0–3.6)
MCH: 31 pg (ref 26.0–34.0)
MCHC: 33.7 g/dL (ref 32.0–36.0)
MCV: 91.9 fL (ref 80.0–100.0)
Monocytes Absolute: 1.2 10*3/uL — ABNORMAL HIGH (ref 0.2–0.9)
Monocytes Relative: 8 %
NEUTROS ABS: 8.4 10*3/uL — AB (ref 1.4–6.5)
NEUTROS PCT: 53 %
Platelets: 321 10*3/uL (ref 150–440)
RBC: 2.98 MIL/uL — ABNORMAL LOW (ref 3.80–5.20)
RDW: 19.1 % — AB (ref 11.5–14.5)
WBC: 15.8 10*3/uL — AB (ref 3.6–11.0)

## 2015-07-15 LAB — COMPREHENSIVE METABOLIC PANEL
ALT: 23 U/L (ref 14–54)
ANION GAP: 9 (ref 5–15)
AST: 48 U/L — ABNORMAL HIGH (ref 15–41)
Albumin: 3.2 g/dL — ABNORMAL LOW (ref 3.5–5.0)
Alkaline Phosphatase: 118 U/L (ref 38–126)
BILIRUBIN TOTAL: 0.6 mg/dL (ref 0.3–1.2)
BUN: 27 mg/dL — ABNORMAL HIGH (ref 6–20)
CO2: 27 mmol/L (ref 22–32)
Calcium: 8.7 mg/dL — ABNORMAL LOW (ref 8.9–10.3)
Chloride: 97 mmol/L — ABNORMAL LOW (ref 101–111)
Creatinine, Ser: 4.69 mg/dL — ABNORMAL HIGH (ref 0.44–1.00)
GFR calc Af Amer: 10 mL/min — ABNORMAL LOW (ref >60)
GFR, EST NON AFRICAN AMERICAN: 9 mL/min — AB (ref >60)
Glucose, Bld: 187 mg/dL — ABNORMAL HIGH (ref 65–99)
POTASSIUM: 3.6 mmol/L (ref 3.5–5.1)
Sodium: 133 mmol/L — ABNORMAL LOW (ref 135–145)
TOTAL PROTEIN: 7.2 g/dL (ref 6.5–8.1)

## 2015-07-15 LAB — BLOOD GAS, VENOUS
Acid-base deficit: 1.8 mmol/L (ref 0.0–2.0)
Bicarbonate: 27.7 mEq/L (ref 21.0–28.0)
Delivery systems: POSITIVE
FIO2: 0.5
O2 SAT: 88.9 %
PCO2 VEN: 76 mmHg — AB (ref 44.0–60.0)
PH VEN: 7.17 — AB (ref 7.320–7.430)
Patient temperature: 37
pO2, Ven: 71 mmHg — ABNORMAL HIGH (ref 31.0–45.0)

## 2015-07-15 LAB — TROPONIN I

## 2015-07-15 MED ORDER — NITROGLYCERIN 2 % TD OINT
1.0000 [in_us] | TOPICAL_OINTMENT | Freq: Four times a day (QID) | TRANSDERMAL | Status: DC
  Administered 2015-07-15 – 2015-07-16 (×2): 1 [in_us] via TOPICAL
  Filled 2015-07-15: qty 30
  Filled 2015-07-15: qty 1

## 2015-07-15 MED ORDER — ALBUTEROL SULFATE (2.5 MG/3ML) 0.083% IN NEBU
5.0000 mg | INHALATION_SOLUTION | Freq: Once | RESPIRATORY_TRACT | Status: AC
  Administered 2015-07-15: 5 mg via RESPIRATORY_TRACT
  Filled 2015-07-15: qty 6

## 2015-07-15 NOTE — ED Notes (Signed)
Nursing home called ems for shortness of breath - sats in the 50's - nursing home gave duo neb - ems gave 2 duo nebs and 125 of solu medrol and 2gm of Mg - pt has hx of CHF and COPD

## 2015-07-15 NOTE — ED Notes (Signed)
Nursing home called ems for shortness of breath - sats in the 30's - nursing home gave duo neb - ems gave 2 duo nebs and 125 of solu medrol and 2gm of Mg - pt has hx of CHF and COPD - Pt denies cough or pain at this time - on arrival to the er her o2 sat was 70% - her spouse reports that she has dialysis on Tues, Thurs, and Sat and that usually she is short of breath on Monday due to a fluid overload (this information was passed on to the physician taking over care)

## 2015-07-16 DIAGNOSIS — R0602 Shortness of breath: Secondary | ICD-10-CM | POA: Diagnosis not present

## 2015-07-16 DIAGNOSIS — E8770 Fluid overload, unspecified: Secondary | ICD-10-CM | POA: Diagnosis not present

## 2015-07-16 DIAGNOSIS — N2581 Secondary hyperparathyroidism of renal origin: Secondary | ICD-10-CM | POA: Diagnosis not present

## 2015-07-16 DIAGNOSIS — D7582 Heparin induced thrombocytopenia (HIT): Secondary | ICD-10-CM | POA: Diagnosis present

## 2015-07-16 DIAGNOSIS — E7800 Pure hypercholesterolemia, unspecified: Secondary | ICD-10-CM | POA: Diagnosis not present

## 2015-07-16 DIAGNOSIS — F419 Anxiety disorder, unspecified: Secondary | ICD-10-CM | POA: Diagnosis present

## 2015-07-16 DIAGNOSIS — R41841 Cognitive communication deficit: Secondary | ICD-10-CM | POA: Diagnosis not present

## 2015-07-16 DIAGNOSIS — I5030 Unspecified diastolic (congestive) heart failure: Secondary | ICD-10-CM | POA: Diagnosis not present

## 2015-07-16 DIAGNOSIS — R0902 Hypoxemia: Secondary | ICD-10-CM | POA: Diagnosis not present

## 2015-07-16 DIAGNOSIS — I214 Non-ST elevation (NSTEMI) myocardial infarction: Secondary | ICD-10-CM | POA: Diagnosis not present

## 2015-07-16 DIAGNOSIS — D631 Anemia in chronic kidney disease: Secondary | ICD-10-CM | POA: Diagnosis present

## 2015-07-16 DIAGNOSIS — I132 Hypertensive heart and chronic kidney disease with heart failure and with stage 5 chronic kidney disease, or end stage renal disease: Secondary | ICD-10-CM | POA: Diagnosis present

## 2015-07-16 DIAGNOSIS — J449 Chronic obstructive pulmonary disease, unspecified: Secondary | ICD-10-CM | POA: Diagnosis not present

## 2015-07-16 DIAGNOSIS — J9621 Acute and chronic respiratory failure with hypoxia: Secondary | ICD-10-CM | POA: Diagnosis present

## 2015-07-16 DIAGNOSIS — I959 Hypotension, unspecified: Secondary | ICD-10-CM | POA: Diagnosis not present

## 2015-07-16 DIAGNOSIS — J811 Chronic pulmonary edema: Secondary | ICD-10-CM | POA: Diagnosis not present

## 2015-07-16 DIAGNOSIS — R0600 Dyspnea, unspecified: Secondary | ICD-10-CM | POA: Diagnosis not present

## 2015-07-16 DIAGNOSIS — Z992 Dependence on renal dialysis: Secondary | ICD-10-CM | POA: Diagnosis not present

## 2015-07-16 DIAGNOSIS — D649 Anemia, unspecified: Secondary | ICD-10-CM | POA: Diagnosis not present

## 2015-07-16 DIAGNOSIS — J9601 Acute respiratory failure with hypoxia: Secondary | ICD-10-CM | POA: Diagnosis not present

## 2015-07-16 DIAGNOSIS — Z8249 Family history of ischemic heart disease and other diseases of the circulatory system: Secondary | ICD-10-CM | POA: Diagnosis not present

## 2015-07-16 DIAGNOSIS — J810 Acute pulmonary edema: Secondary | ICD-10-CM | POA: Diagnosis not present

## 2015-07-16 DIAGNOSIS — Y832 Surgical operation with anastomosis, bypass or graft as the cause of abnormal reaction of the patient, or of later complication, without mention of misadventure at the time of the procedure: Secondary | ICD-10-CM | POA: Diagnosis present

## 2015-07-16 DIAGNOSIS — R531 Weakness: Secondary | ICD-10-CM | POA: Diagnosis not present

## 2015-07-16 DIAGNOSIS — I5031 Acute diastolic (congestive) heart failure: Secondary | ICD-10-CM | POA: Diagnosis not present

## 2015-07-16 DIAGNOSIS — R262 Difficulty in walking, not elsewhere classified: Secondary | ICD-10-CM | POA: Diagnosis not present

## 2015-07-16 DIAGNOSIS — Z792 Long term (current) use of antibiotics: Secondary | ICD-10-CM | POA: Diagnosis not present

## 2015-07-16 DIAGNOSIS — H8110 Benign paroxysmal vertigo, unspecified ear: Secondary | ICD-10-CM | POA: Diagnosis not present

## 2015-07-16 DIAGNOSIS — K219 Gastro-esophageal reflux disease without esophagitis: Secondary | ICD-10-CM | POA: Diagnosis present

## 2015-07-16 DIAGNOSIS — E039 Hypothyroidism, unspecified: Secondary | ICD-10-CM | POA: Diagnosis present

## 2015-07-16 DIAGNOSIS — K31819 Angiodysplasia of stomach and duodenum without bleeding: Secondary | ICD-10-CM | POA: Diagnosis present

## 2015-07-16 DIAGNOSIS — I5033 Acute on chronic diastolic (congestive) heart failure: Secondary | ICD-10-CM | POA: Diagnosis not present

## 2015-07-16 DIAGNOSIS — Z823 Family history of stroke: Secondary | ICD-10-CM | POA: Diagnosis not present

## 2015-07-16 DIAGNOSIS — T814XXA Infection following a procedure, initial encounter: Secondary | ICD-10-CM | POA: Diagnosis not present

## 2015-07-16 DIAGNOSIS — Z79899 Other long term (current) drug therapy: Secondary | ICD-10-CM | POA: Diagnosis not present

## 2015-07-16 DIAGNOSIS — N186 End stage renal disease: Secondary | ICD-10-CM | POA: Diagnosis not present

## 2015-07-16 DIAGNOSIS — Z8679 Personal history of other diseases of the circulatory system: Secondary | ICD-10-CM | POA: Diagnosis not present

## 2015-07-16 DIAGNOSIS — O0000 Abdominal pregnancy without intrauterine pregnancy: Secondary | ICD-10-CM | POA: Diagnosis not present

## 2015-07-16 DIAGNOSIS — A498 Other bacterial infections of unspecified site: Secondary | ICD-10-CM | POA: Diagnosis not present

## 2015-07-16 DIAGNOSIS — Z8049 Family history of malignant neoplasm of other genital organs: Secondary | ICD-10-CM | POA: Diagnosis not present

## 2015-07-16 DIAGNOSIS — B9689 Other specified bacterial agents as the cause of diseases classified elsewhere: Secondary | ICD-10-CM | POA: Diagnosis present

## 2015-07-16 DIAGNOSIS — I713 Abdominal aortic aneurysm, ruptured: Secondary | ICD-10-CM | POA: Diagnosis not present

## 2015-07-16 DIAGNOSIS — J441 Chronic obstructive pulmonary disease with (acute) exacerbation: Secondary | ICD-10-CM | POA: Diagnosis not present

## 2015-07-16 DIAGNOSIS — L7634 Postprocedural seroma of skin and subcutaneous tissue following other procedure: Secondary | ICD-10-CM | POA: Diagnosis present

## 2015-07-16 DIAGNOSIS — E8779 Other fluid overload: Secondary | ICD-10-CM | POA: Diagnosis not present

## 2015-07-16 DIAGNOSIS — Z87891 Personal history of nicotine dependence: Secondary | ICD-10-CM | POA: Diagnosis not present

## 2015-07-16 DIAGNOSIS — Z9981 Dependence on supplemental oxygen: Secondary | ICD-10-CM | POA: Diagnosis not present

## 2015-07-16 DIAGNOSIS — Z7951 Long term (current) use of inhaled steroids: Secondary | ICD-10-CM | POA: Diagnosis not present

## 2015-07-16 DIAGNOSIS — I10 Essential (primary) hypertension: Secondary | ICD-10-CM | POA: Diagnosis not present

## 2015-07-16 DIAGNOSIS — T814XXS Infection following a procedure, sequela: Secondary | ICD-10-CM | POA: Diagnosis not present

## 2015-07-16 DIAGNOSIS — J9602 Acute respiratory failure with hypercapnia: Secondary | ICD-10-CM | POA: Diagnosis not present

## 2015-07-16 DIAGNOSIS — I120 Hypertensive chronic kidney disease with stage 5 chronic kidney disease or end stage renal disease: Secondary | ICD-10-CM | POA: Diagnosis not present

## 2015-07-16 DIAGNOSIS — M6281 Muscle weakness (generalized): Secondary | ICD-10-CM | POA: Diagnosis not present

## 2015-07-16 DIAGNOSIS — E785 Hyperlipidemia, unspecified: Secondary | ICD-10-CM | POA: Diagnosis present

## 2015-07-16 DIAGNOSIS — R1310 Dysphagia, unspecified: Secondary | ICD-10-CM | POA: Diagnosis not present

## 2015-07-16 LAB — BASIC METABOLIC PANEL
Anion gap: 9 (ref 5–15)
BUN: 31 mg/dL — AB (ref 6–20)
CHLORIDE: 98 mmol/L — AB (ref 101–111)
CO2: 26 mmol/L (ref 22–32)
Calcium: 8.7 mg/dL — ABNORMAL LOW (ref 8.9–10.3)
Creatinine, Ser: 4.82 mg/dL — ABNORMAL HIGH (ref 0.44–1.00)
GFR calc Af Amer: 10 mL/min — ABNORMAL LOW (ref >60)
GFR, EST NON AFRICAN AMERICAN: 9 mL/min — AB (ref >60)
Glucose, Bld: 139 mg/dL — ABNORMAL HIGH (ref 65–99)
Potassium: 3.4 mmol/L — ABNORMAL LOW (ref 3.5–5.1)
SODIUM: 133 mmol/L — AB (ref 135–145)

## 2015-07-16 LAB — CBC
HEMATOCRIT: 21.9 % — AB (ref 35.0–47.0)
HEMOGLOBIN: 7.5 g/dL — AB (ref 12.0–16.0)
MCH: 30.7 pg (ref 26.0–34.0)
MCHC: 34.2 g/dL (ref 32.0–36.0)
MCV: 89.7 fL (ref 80.0–100.0)
Platelets: 128 10*3/uL — ABNORMAL LOW (ref 150–440)
RBC: 2.44 MIL/uL — AB (ref 3.80–5.20)
RDW: 18.8 % — AB (ref 11.5–14.5)
WBC: 7.4 10*3/uL (ref 3.6–11.0)

## 2015-07-16 LAB — BLOOD GAS, ARTERIAL
ACID-BASE EXCESS: 3.3 mmol/L — AB (ref 0.0–3.0)
Bicarbonate: 27.8 mEq/L (ref 21.0–28.0)
DELIVERY SYSTEMS: POSITIVE
Expiratory PAP: 5
FIO2: 0.5
INSPIRATORY PAP: 10
O2 Saturation: 98.9 %
PCO2 ART: 41 mmHg (ref 32.0–48.0)
Patient temperature: 37
pH, Arterial: 7.44 (ref 7.350–7.450)
pO2, Arterial: 124 mmHg — ABNORMAL HIGH (ref 83.0–108.0)

## 2015-07-16 LAB — GLUCOSE, CAPILLARY: GLUCOSE-CAPILLARY: 133 mg/dL — AB (ref 65–99)

## 2015-07-16 LAB — MRSA PCR SCREENING: MRSA by PCR: NEGATIVE

## 2015-07-16 MED ORDER — DOCUSATE SODIUM 100 MG PO CAPS
100.0000 mg | ORAL_CAPSULE | Freq: Two times a day (BID) | ORAL | Status: DC
  Administered 2015-07-21: 100 mg via ORAL
  Filled 2015-07-16 (×9): qty 1

## 2015-07-16 MED ORDER — FAMOTIDINE 20 MG PO TABS
20.0000 mg | ORAL_TABLET | Freq: Every day | ORAL | Status: DC
  Administered 2015-07-16 – 2015-07-22 (×6): 20 mg via ORAL
  Filled 2015-07-16 (×6): qty 1

## 2015-07-16 MED ORDER — MAGNESIUM HYDROXIDE 400 MG/5ML PO SUSP: 30.0000 mL | Freq: Every day | ORAL | Status: DC | PRN

## 2015-07-16 MED ORDER — SALINE SPRAY 0.65 % NA SOLN
1.0000 | Freq: Every day | NASAL | Status: DC
  Administered 2015-07-16 – 2015-07-23 (×30): 1 via NASAL
  Filled 2015-07-16 (×2): qty 44

## 2015-07-16 MED ORDER — ONDANSETRON HCL 4 MG/2ML IJ SOLN: 4.0000 mg | Freq: Four times a day (QID) | INTRAMUSCULAR | Status: DC | PRN

## 2015-07-16 MED ORDER — METOPROLOL TARTRATE 25 MG PO TABS
25.0000 mg | ORAL_TABLET | Freq: Three times a day (TID) | ORAL | Status: DC
  Administered 2015-07-16 – 2015-07-23 (×20): 25 mg via ORAL
  Filled 2015-07-16 (×22): qty 1

## 2015-07-16 MED ORDER — CETYLPYRIDINIUM CHLORIDE 0.05 % MT LIQD: 7.0000 mL | Freq: Two times a day (BID) | OROMUCOSAL | Status: DC

## 2015-07-16 MED ORDER — SODIUM CHLORIDE 0.9% FLUSH
3.0000 mL | Freq: Two times a day (BID) | INTRAVENOUS | Status: DC
  Administered 2015-07-16 – 2015-07-23 (×13): 3 mL via INTRAVENOUS

## 2015-07-16 MED ORDER — ATORVASTATIN CALCIUM 20 MG PO TABS
40.0000 mg | ORAL_TABLET | Freq: Every day | ORAL | Status: DC
  Administered 2015-07-16 – 2015-07-19 (×3): 40 mg via ORAL
  Filled 2015-07-16: qty 2
  Filled 2015-07-16: qty 4
  Filled 2015-07-16: qty 2

## 2015-07-16 MED ORDER — RENA-VITE PO TABS
1.0000 | ORAL_TABLET | Freq: Every day | ORAL | Status: DC
  Administered 2015-07-16 – 2015-07-22 (×5): 1 via ORAL
  Filled 2015-07-16 (×6): qty 1

## 2015-07-16 MED ORDER — EPOETIN ALFA 10000 UNIT/ML IJ SOLN
10000.0000 [IU] | Freq: Once | INTRAMUSCULAR | Status: AC
  Administered 2015-07-16: 10000 [IU] via INTRAVENOUS

## 2015-07-16 MED ORDER — HYDROCODONE-ACETAMINOPHEN 5-325 MG PO TABS
1.0000 | ORAL_TABLET | Freq: Four times a day (QID) | ORAL | Status: DC | PRN
  Administered 2015-07-16: 1 via ORAL
  Administered 2015-07-16: 2 via ORAL
  Administered 2015-07-16: 1 via ORAL
  Administered 2015-07-17 – 2015-07-22 (×6): 2 via ORAL
  Administered 2015-07-23: 1 via ORAL
  Filled 2015-07-16 (×2): qty 2
  Filled 2015-07-16: qty 1
  Filled 2015-07-16: qty 2
  Filled 2015-07-16 (×2): qty 1
  Filled 2015-07-16 (×4): qty 2

## 2015-07-16 MED ORDER — HYDRALAZINE HCL 25 MG PO TABS
25.0000 mg | ORAL_TABLET | Freq: Three times a day (TID) | ORAL | Status: DC
  Administered 2015-07-16 – 2015-07-23 (×20): 25 mg via ORAL
  Filled 2015-07-16 (×21): qty 1

## 2015-07-16 MED ORDER — FLUTICASONE PROPIONATE 50 MCG/ACT NA SUSP
2.0000 | Freq: Every day | NASAL | Status: DC
  Administered 2015-07-16 – 2015-07-22 (×5): 2 via NASAL
  Filled 2015-07-16 (×2): qty 16

## 2015-07-16 MED ORDER — SODIUM CHLORIDE 0.9% FLUSH
3.0000 mL | INTRAVENOUS | Status: DC | PRN
  Administered 2015-07-22 – 2015-07-23 (×2): 3 mL via INTRAVENOUS
  Filled 2015-07-16 (×2): qty 3

## 2015-07-16 MED ORDER — ACETAMINOPHEN 325 MG PO TABS
650.0000 mg | ORAL_TABLET | Freq: Four times a day (QID) | ORAL | Status: DC | PRN
  Administered 2015-07-16 – 2015-07-23 (×3): 650 mg via ORAL
  Filled 2015-07-16 (×3): qty 2

## 2015-07-16 MED ORDER — CETYLPYRIDINIUM CHLORIDE 0.05 % MT LIQD
7.0000 mL | Freq: Two times a day (BID) | OROMUCOSAL | Status: DC
  Administered 2015-07-16 – 2015-07-22 (×12): 7 mL via OROMUCOSAL

## 2015-07-16 MED ORDER — AMLODIPINE BESYLATE 5 MG PO TABS
5.0000 mg | ORAL_TABLET | Freq: Every day | ORAL | Status: DC
  Administered 2015-07-16 – 2015-07-22 (×5): 5 mg via ORAL
  Filled 2015-07-16 (×5): qty 1

## 2015-07-16 MED ORDER — HYDROCOD POLST-CPM POLST ER 10-8 MG/5ML PO SUER: 5.0000 mL | Freq: Two times a day (BID) | ORAL | Status: DC | PRN

## 2015-07-16 MED ORDER — SODIUM CHLORIDE 0.9% FLUSH
3.0000 mL | Freq: Two times a day (BID) | INTRAVENOUS | Status: DC
  Administered 2015-07-16 – 2015-07-23 (×15): 3 mL via INTRAVENOUS

## 2015-07-16 MED ORDER — ALBUTEROL SULFATE (2.5 MG/3ML) 0.083% IN NEBU
2.5000 mg | INHALATION_SOLUTION | Freq: Four times a day (QID) | RESPIRATORY_TRACT | Status: DC | PRN
  Administered 2015-07-17 – 2015-07-19 (×3): 2.5 mg via RESPIRATORY_TRACT
  Filled 2015-07-16 (×3): qty 3

## 2015-07-16 MED ORDER — SODIUM CHLORIDE 0.9 % IV SOLN: 250.0000 mL | INTRAVENOUS | Status: DC | PRN

## 2015-07-16 MED ORDER — ALPRAZOLAM 0.25 MG PO TABS
0.2500 mg | ORAL_TABLET | Freq: Two times a day (BID) | ORAL | Status: DC | PRN
  Administered 2015-07-16 – 2015-07-22 (×8): 0.25 mg via ORAL
  Filled 2015-07-16 (×8): qty 1

## 2015-07-16 MED ORDER — CHLORHEXIDINE GLUCONATE 0.12 % MT SOLN
15.0000 mL | Freq: Two times a day (BID) | OROMUCOSAL | Status: DC
  Administered 2015-07-16: 15 mL via OROMUCOSAL

## 2015-07-16 MED ORDER — PANTOPRAZOLE SODIUM 40 MG PO TBEC
40.0000 mg | DELAYED_RELEASE_TABLET | Freq: Two times a day (BID) | ORAL | Status: DC
  Administered 2015-07-16 – 2015-07-23 (×13): 40 mg via ORAL
  Filled 2015-07-16 (×13): qty 1

## 2015-07-16 MED ORDER — LEVOTHYROXINE SODIUM 88 MCG PO TABS
88.0000 ug | ORAL_TABLET | Freq: Every day | ORAL | Status: DC
  Administered 2015-07-16 – 2015-07-23 (×6): 88 ug via ORAL
  Filled 2015-07-16 (×6): qty 1

## 2015-07-16 NOTE — Progress Notes (Signed)
Pre-hd tx 

## 2015-07-16 NOTE — Progress Notes (Signed)
[place] [facility] NAME: [name]    MR#:  [id number]  DATE OF BIRTH:  [birth year]  SUBJECTIVE:  CHIEF COMPLAINT:   Chief Complaint  Patient presents with  . Shortness of Breath   Better cough and SOB. REVIEW OF SYSTEMS:  CONSTITUTIONAL: No fever, fatigue or weakness.  EYES: No blurred or double vision.  EARS, NOSE, AND THROAT: No tinnitus or ear pain.  RESPIRATORY: has cough, shortness of breath, no wheezing or hemoptysis.  CARDIOVASCULAR: No chest pain, orthopnea, has leg edema.  GASTROINTESTINAL: No nausea, vomiting, diarrhea or abdominal pain.  GENITOURINARY: No dysuria, hematuria.  ENDOCRINE: No polyuria, nocturia,  HEMATOLOGY: No anemia, easy bruising or bleeding SKIN: No rash or lesion. MUSCULOSKELETAL: No joint pain or arthritis.   NEUROLOGIC: No tingling, numbness, weakness.  PSYCHIATRY: No anxiety or depression.   DRUG ALLERGIES:   Allergies  Allergen Reactions  . Asa [Aspirin] Other (See Comments)    unknown  . 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 Plavix [Clopidogrel] Other (See Comments)    unknown    VITALS:  Blood pressure 118/66, pulse 109, temperature 98.2 F (36.8 C), temperature source Oral, resp. rate 15, height 5\' 4"  (1.626 m), weight 58 kg (127 lb 13.9 oz), last menstrual period 03/30/1988, SpO2 95 %.  PHYSICAL EXAMINATION:  GENERAL:  65 y.o.-year-old patient lying in the bed with no acute distress.  EYES: Pupils equal, round, reactive to light and accommodation. No scleral icterus. Extraocular muscles intact.  HEENT: Head atraumatic, normocephalic. Oropharynx and nasopharynx clear.  NECK:  Supple, no jugular venous distention. No thyroid enlargement, no tenderness.  LUNGS: Normal breath sounds bilaterally, no wheezing, rales,rhonchi or crepitation. No use of accessory muscles of respiration.  CARDIOVASCULAR: S1, S2 normal. No murmurs, rubs, or gallops.  ABDOMEN:  Soft, nontender, nondistended. Bowel sounds present. No organomegaly or mass.  EXTREMITIES: No pedal edema, cyanosis, or clubbing.  NEUROLOGIC: Cranial nerves II through XII are intact. Muscle strength 5/5 in all extremities. Sensation intact. Gait not checked.  PSYCHIATRIC: The patient is alert and oriented x 3.  SKIN: No obvious rash, lesion, or ulcer.    LABORATORY PANEL:   CBC  Recent Labs Lab 07/16/15 0413  WBC 7.4  HGB 7.5*  HCT 21.9*  PLT 128*   ------------------------------------------------------------------------------------------------------------------  Chemistries   Recent Labs Lab 07/15/15 2307 07/16/15 0413  NA 133* 133*  K 3.6 3.4*  CL 97* 98*  CO2 27 26  GLUCOSE 187* 139*  BUN 27* 31*  CREATININE 4.69* 4.82*  CALCIUM 8.7* 8.7*  AST 48*  --   ALT 23  --   ALKPHOS 118  --   BILITOT 0.6  --    ------------------------------------------------------------------------------------------------------------------  Cardiac Enzymes  Recent Labs Lab 07/15/15 2307  TROPONINI <0.03   ------------------------------------------------------------------------------------------------------------------  RADIOLOGY:  Dg Chest Port 1 View  07/15/2015  CLINICAL DATA:  Difficulty breathing.  Wheezing.  Dialysis patient. EXAM: PORTABLE CHEST 1 VIEW COMPARISON:  06/23/2015 FINDINGS: Cardiac enlargement with central pulmonary vascular congestion. Bilateral pleural effusions with basilar infiltration, likely due to edema. Findings are mildly progressed since previous study. No pneumothorax. Mediastinal contours appear intact. Right central venous dialysis catheter present with lead tip over the cavoatrial junction. IMPRESSION: Cardiac enlargement with pulmonary vascular congestion, bilateral pleural effusions, and infiltration suggesting edema. Changes demonstrate mild progression since previous study. Electronically Signed   By: Lucienne Capers M.D.   On: 07/15/2015 23:53  EKG:   Orders placed or performed during the hospital encounter of 06/15/15  . ED EKG  . ED EKG  . EKG 12-Lead  . EKG 12-Lead    ASSESSMENT AND PLAN:   65 year old female patient is a history of end-stage renal disease on dialysis, hypertension, congestive heart failure, COPD, hyperlipidemia presented to the emergency room with difficulty breathing.  1. Acute on chronic diastolic heart failure with Fluid overload. Continue lasix.   2. Acute espiratory failure with hypoxia. Off BIPAP, on O2 [place]. 2L. NEB Prn.  3. ESRD on dialysis. continue HD today.  4. Hypertension. Continue  hypertensive medication.  5. COPD. Neb prn.  6. Anemia of chronic disease. F/u Hb.  Discussed with Dr. Juleen China. All the records are reviewed and case discussed with Care Management/Social Workerr. Management plans discussed with the patient, her husband and they are in agreement.  CODE STATUS: full code.  TOTAL TIME TAKING CARE OF THIS PATIENT: 42 minutes.  Greater than 50% time was spent on coordination of care and face-to-face counseling.  POSSIBLE D/C IN 3 DAYS, DEPENDING ON CLINICAL CONDITION.   Demetrios Loll M.D on 07/16/2015 at 4:30 PM  Between 7am to 6pm - Pager - [phone]  After 6pm go to [email or web address] - password [email or web address]  Office  [phone]  CC: Primary care physician; Arnette Norris, MD

## 2015-07-16 NOTE — Progress Notes (Signed)
Hemodialysis completed. 
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
## 2015-07-16 NOTE — Progress Notes (Signed)
Post hd tx 
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
## 2015-07-16 NOTE — Progress Notes (Signed)
Hemodialysis start 

## 2015-07-16 NOTE — Consult Note (Signed)
WOC wound consult note Reason for Consult:S/P infection to right inguinal surgical site with debridement.  NPWT (VAC) therapy was discontinued three days ago, per spouse.   Wound type:surgical site, resolving.  Pressure Ulcer POA: N/A Measurement:2 cm x 1 cm x 0.5 cm increasing granulation tissue noted since last admission.  Pale pink wound bed.  Wound [id number] pink.  Moderate serous drainage.  Drainage (amount, consistency, odor) Moderate serous.  No odor.  Peripheral edema present.  Periwound:Edematous Dressing procedure/placement/frequency:Cleanse wound to right inguinal fold with NS.  Apply calcium alginate to wound bed for absorption.  Cover with 4x4 gauze and tape.  Change twice daily.   Will not follow at this time.  Please re-consult if needed.  Domenic Moras RN BSN [place] Pager [phone]

## 2015-07-16 NOTE — Progress Notes (Signed)
Report given to Remo Lipps, RN on Med-Surg. Pt stable, will be transporting shortly.

## 2015-07-16 NOTE — Progress Notes (Signed)
Patient arrived 0200, resting comfortably on bipap 10/5 50%, medicated for pain x2. VSS, transitioned to 2L nasal cannula.  See SCM for further assessment, will continue to assess for changes

## 2015-07-16 NOTE — Care Management (Signed)
Admitted to [facility] with the diagnosis of fluid overload. Discharged from this facility 06/24/15 to [facility]. Sees [name] as primary care physician. Placed on bi-pap Bun 31. Creatinine 4.82. Down for dialysis.   Hgb 7.5 today. Shelbie Ammons RN MSN CCM Care Management [phone]

## 2015-07-16 NOTE — Progress Notes (Signed)
Pt still on ordered q 6 hour nitro paste. Spoke with MD as pt is no longer hypertensive nor has active chest pain. Discontinued per Dr. Verdell Carmine. Zeriyah Wain E 6:19 PM 07/16/2015

## 2015-07-16 NOTE — Progress Notes (Signed)
[facility]  ROUNDING NOTE   Subjective:   Admitted overnight for acute exacerbation of diastolic congestive heart failure and COPD requiring BIpap.  Placed on dialysis. Tolerating treatment well. UF goal of 2 liters.   Objective:  Vital signs in last 24 hours:  Temp:  [97.8 F (36.6 C)-98 F (36.7 C)] 97.9 F (36.6 C) (04/18 1018) Pulse Rate:  [84-133] 92 (04/18 1200) Resp:  [10-34] 15 (04/18 1200) BP: (110-193)/(57-106) 112/67 mmHg (04/18 1200) SpO2:  [95 %-100 %] 96 % (04/18 1200) FiO2 (%):  [35 %-50 %] 35 % (04/18 0500) Weight:  [60.1 kg (132 lb 7.9 oz)-63.504 kg (140 lb)] 60.1 kg (132 lb 7.9 oz) (04/18 1018)  Weight change:  Filed Weights   07/15/15 2311 07/16/15 0220 07/16/15 1018  Weight: 63.504 kg (140 lb) 60.1 kg (132 lb 7.9 oz) 60.1 kg (132 lb 7.9 oz)    Intake/Output:     Intake/Output this shift:     Physical Exam: General: NAD, resting in bed  Head: Normocephalic, atraumatic. Moist oral mucosal membranes  Eyes: Anicteric  Neck: Supple, trachea midline  Lungs:  Bilateral crackles  Heart: S1S2 no rubs  Abdomen:  Soft, nontender, BS present  Extremities: 1+ LE edema  Neurologic: Nonfocal, moving all four extremities  Skin: No lesions  Access: R IJ PC    Basic Metabolic Panel:  Recent Labs Lab 07/15/15 2307 07/16/15 0413  NA 133* 133*  K 3.6 3.4*  CL 97* 98*  CO2 27 26  GLUCOSE 187* 139*  BUN 27* 31*  CREATININE 4.69* 4.82*  CALCIUM 8.7* 8.7*    Liver Function Tests:  Recent Labs Lab 07/15/15 2307  AST 48*  ALT 23  ALKPHOS 118  BILITOT 0.6  PROT 7.2  ALBUMIN 3.2*   No results for input(s): LIPASE, AMYLASE in the last 168 hours. No results for input(s): AMMONIA in the last 168 hours.  CBC:  Recent Labs Lab 07/15/15 2307 07/16/15 0413  WBC 15.8* 7.4  NEUTROABS 8.4*  --   HGB 9.2* 7.5*  HCT 27.4* 21.9*  MCV 91.9 89.7  PLT 321 128*    Cardiac Enzymes:  Recent Labs Lab 07/15/15 2307  TROPONINI <0.03     BNP: Invalid input(s): POCBNP  CBG: No results for input(s): GLUCAP in the last 168 hours.  Microbiology: Results for orders placed or performed during the hospital encounter of 07/15/15  MRSA PCR Screening     Status: None   Collection Time: 07/16/15  2:42 AM  Result Value Ref Range Status   MRSA by PCR NEGATIVE NEGATIVE Final    Comment:        The GeneXpert MRSA Assay (FDA approved for NASAL specimens only), is one component of a comprehensive MRSA colonization surveillance program. It is not intended to diagnose MRSA infection nor to guide or monitor treatment for MRSA infections.     Coagulation Studies: No results for input(s): LABPROT, INR in the last 72 hours.  Urinalysis: No results for input(s): COLORURINE, LABSPEC, PHURINE, GLUCOSEU, HGBUR, BILIRUBINUR, KETONESUR, PROTEINUR, UROBILINOGEN, NITRITE, LEUKOCYTESUR in the last 72 hours.  Invalid input(s): APPERANCEUR    Imaging: Dg Chest Port 1 View  07/15/2015  CLINICAL DATA:  Difficulty breathing.  Wheezing.  Dialysis patient. EXAM: PORTABLE CHEST 1 VIEW COMPARISON:  06/23/2015 FINDINGS: Cardiac enlargement with central pulmonary vascular congestion. Bilateral pleural effusions with basilar infiltration, likely due to edema. Findings are mildly progressed since previous study. No pneumothorax. Mediastinal contours appear intact. Right central venous dialysis catheter  present with lead tip over the cavoatrial junction. IMPRESSION: Cardiac enlargement with pulmonary vascular congestion, bilateral pleural effusions, and infiltration suggesting edema. Changes demonstrate mild progression since previous study. Electronically Signed   By: Lucienne Capers M.D.   On: 07/15/2015 23:53     Medications:     . amLODipine  5 mg Oral Daily  . antiseptic oral rinse  7 mL Mouth Rinse BID  . atorvastatin  40 mg Oral Daily  . docusate sodium  100 mg Oral BID  . famotidine  20 mg Oral Daily  . fluticasone  2 spray Each  Nare Daily  . hydrALAZINE  25 mg Oral 3 times per day  . levothyroxine  88 mcg Oral QAC breakfast  . metoprolol tartrate  25 mg Oral 3 times per day  . multivitamin  1 tablet Oral Daily  . nitroGLYCERIN  1 inch Topical 4 times per day  . pantoprazole  40 mg Oral BID AC  . sodium chloride  1 spray Each Nare 5 X Daily  . sodium chloride flush  3 mL Intravenous Q12H  . sodium chloride flush  3 mL Intravenous Q12H   sodium chloride, acetaminophen, albuterol, ALPRAZolam, chlorpheniramine-HYDROcodone, HYDROcodone-acetaminophen, magnesium hydroxide, ondansetron (ZOFRAN) IV, sodium chloride flush  Assessment/ Plan:  65 y.o. female with COPD, hypertension, hyperlipidemia, vaginal intraepithelial neoplasia, emergent AAA repair status post rupture on November 18, left to right femoral to femoral bypass , acute renal failure leading to permanent dialysis, significant 3 vessel disease, HIT positive, thrombocytopenia.  [facility] TTS [place]  1.  ESRD on HD:  Continue TTS schedule. Emergent dialysis this morning.   2.  Anemia of CKD: with duodenal AVM  - epo with treatment.   3.  Secondary Hyperparathyroidism: not currently on binders. PTH low at 21 on 06/18/15  4. Hypertension with diastolic congestive heart failure: acute exacerbation. Weaned off Bipap with ultrafiltration.  Continues to have peripheral edema.    LOS: 0 Manjinder Breau 4/18/201712:19 PM

## 2015-07-17 DIAGNOSIS — E8770 Fluid overload, unspecified: Secondary | ICD-10-CM

## 2015-07-17 DIAGNOSIS — N186 End stage renal disease: Secondary | ICD-10-CM

## 2015-07-17 DIAGNOSIS — Z992 Dependence on renal dialysis: Secondary | ICD-10-CM

## 2015-07-17 DIAGNOSIS — R0600 Dyspnea, unspecified: Secondary | ICD-10-CM

## 2015-07-17 DIAGNOSIS — I959 Hypotension, unspecified: Secondary | ICD-10-CM

## 2015-07-17 LAB — BLOOD GAS, ARTERIAL
ACID-BASE EXCESS: 10 mmol/L — AB (ref 0.0–3.0)
Bicarbonate: 33.2 mEq/L — ABNORMAL HIGH (ref 21.0–28.0)
DELIVERY SYSTEMS: POSITIVE
Expiratory PAP: 8
FIO2: 0.4
Inspiratory PAP: 18
O2 SAT: 97.8 %
PATIENT TEMPERATURE: 37
PCO2 ART: 38 mmHg (ref 32.0–48.0)
pH, Arterial: 7.55 — ABNORMAL HIGH (ref 7.350–7.450)
pO2, Arterial: 88 mmHg (ref 83.0–108.0)

## 2015-07-17 LAB — GLUCOSE, CAPILLARY: Glucose-Capillary: 158 mg/dL — ABNORMAL HIGH (ref 65–99)

## 2015-07-17 MED ORDER — LEVALBUTEROL HCL 0.63 MG/3ML IN NEBU
0.6300 mg | INHALATION_SOLUTION | RESPIRATORY_TRACT | Status: DC
  Administered 2015-07-17 – 2015-07-19 (×13): 0.63 mg via RESPIRATORY_TRACT
  Filled 2015-07-17 (×13): qty 3

## 2015-07-17 MED ORDER — ANTICOAGULANT SODIUM CITRATE 4% (200MG/5ML) IV SOLN
5.0000 mL | Status: DC | PRN
  Administered 2015-07-17 – 2015-07-20 (×3): 5 mL via INTRAVENOUS
  Filled 2015-07-17 (×6): qty 250

## 2015-07-17 MED ORDER — HEPARIN SODIUM (PORCINE) 5000 UNIT/ML IJ SOLN
5000.0000 [IU] | Freq: Three times a day (TID) | INTRAMUSCULAR | Status: DC
  Filled 2015-07-17 (×6): qty 1
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
## 2015-07-17 NOTE — Progress Notes (Signed)
Pre dialysis assessment 

## 2015-07-17 NOTE — Progress Notes (Signed)
Patient complaining of shortness of breath, oxygen saturation of 86% on 2L nasal cannula. Non-rebreather mask applied. Oxygen saturations up to 100%. 2L Nasal cannula reapplied, saturations in 90's. Patient complaining of not being abe to breathe again. Non-rebreather applied. Saturation of 90%, however inability to breathe evident. Rapid response called. CCU charge nurse, respiratory, and Dr. Leslye Peer arrived in room. Patient ordered to have immediate dialysis to remove fluid and transfer to CCU for continuous Bi-Pap. Patient put on Bi-Pap and transferred to CCU via RN escorted by respiratory, CCU charge nurse, and husband. Report given to Maudie Mercury, RN in CCU.

## 2015-07-17 NOTE — Progress Notes (Addendum)
Patient ID: [name], female   DOB: December 25, 1950, 65 y.o.   MRN: [id number]  Rapid response called. Patient asking for Bipap Spoke with Dr Rolly Salter to get dialysis going asap Staff spoke with dialysis nurse to meet them in ICU Bipap will be placed Transfer orders put in for icu. Patient is critically ill.  Spoke with patient and family Another 15 minutes spent with patient and family. Dr Milinda Antis Leslye Peer

## 2015-07-17 NOTE — Progress Notes (Signed)
Rapid Response Event Note  Overview:  Rapid response called pt tachypnic using accessory muscles to breath on 100% nonrebreather with O2 sats low 90's.  Dr. Leslye Peer at bedside.  Md stated pt needs emergent hemodialysis given orders to transport pt to ICU     Initial Focused Assessment: Rapid response called pt tachypnic using accessory muscles to breath on 100% nonrebreather with O2 sats low 90's.  Dr. Leslye Peer at bedside.  Md stated pt needs emergent hemodialysis given orders to transport pt to ICU     Interventions: Pt placed on 50% bipap and transported to ICU bed 13 for emergent hemodialysis   Event Summary: Pt transported to ICU Bed [place]

## 2015-07-17 NOTE — Progress Notes (Signed)
Patient ID: [name], female   DOB: 1951/03/11, 65 y.o.   MRN: [id number] [facility] PROGRESS NOTE  [name] [id number] DOB: 01/01/1951 DOA: 07/15/2015 PCP: Arnette Norris, MD  HPI/Subjective: I walked in the room and patient was on 100% NRBR, patient using assesory muscles to breathe. She is very short of breath. Unable to take a deep breath.  Objective: Filed Vitals:   07/17/15 0538 07/17/15 0734  BP: 138/64 137/60  Pulse: 89 83  Temp: 98 F (36.7 C) 98.4 F (36.9 C)  Resp: 17 18    Filed Weights   07/16/15 0220 07/16/15 1018 07/16/15 1400  Weight: 60.1 kg (132 lb 7.9 oz) 60.1 kg (132 lb 7.9 oz) 58 kg (127 lb 13.9 oz)    ROS: Review of Systems  Constitutional: Negative for fever and chills.  Eyes: Negative for blurred vision.  Respiratory: Positive for cough and shortness of breath.   Cardiovascular: Negative for chest pain.  Gastrointestinal: Negative for nausea, vomiting, abdominal pain, diarrhea and constipation.  Genitourinary: Negative for dysuria.  Musculoskeletal: Negative for joint pain.  Neurological: Negative for dizziness and headaches.   Exam: Physical Exam  Constitutional: She is oriented to person, place, and time.  HENT:  Nose: No mucosal edema.  Mouth/Throat: No oropharyngeal exudate or posterior oropharyngeal edema.  Eyes: Conjunctivae, EOM and lids are normal. Pupils are equal, round, and reactive to light.  Neck: No JVD present. Carotid bruit is not present. No edema present. No thyroid mass and no thyromegaly present.  Cardiovascular: S1 normal and S2 normal.  Exam reveals no gallop.   No murmur heard. Pulses:      Dorsalis pedis pulses are 2+ on the right side, and 2+ on the left side.  Respiratory: No respiratory distress. She has decreased breath sounds in the right middle field, the right lower field, the left middle field and the left lower field. She has no wheezes. She has no rhonchi. She has rales in the right lower  field and the left lower field.  GI: Soft. Bowel sounds are normal. There is no tenderness.  Musculoskeletal:       Right ankle: She exhibits swelling.       Left ankle: She exhibits swelling.  Lymphadenopathy:    She has no cervical adenopathy.  Neurological: She is alert and oriented to person, place, and time. No cranial nerve deficit.  Skin: Skin is warm. No rash noted. Nails show no clubbing.  Psychiatric: She has a normal mood and affect.    Data Reviewed: Basic Metabolic Panel:  Recent Labs Lab 07/15/15 2307 07/16/15 0413  NA 133* 133*  K 3.6 3.4*  CL 97* 98*  CO2 27 26  GLUCOSE 187* 139*  BUN 27* 31*  CREATININE 4.69* 4.82*  CALCIUM 8.7* 8.7*   Liver Function Tests:  Recent Labs Lab 07/15/15 2307  AST 48*  ALT 23  ALKPHOS 118  BILITOT 0.6  PROT 7.2  ALBUMIN 3.2*   CBC:  Recent Labs Lab 07/15/15 2307 07/16/15 0413  WBC 15.8* 7.4  NEUTROABS 8.4*  --   HGB 9.2* 7.5*  HCT 27.4* 21.9*  MCV 91.9 89.7  PLT 321 128*   Cardiac Enzymes:  Recent Labs Lab 07/15/15 2307  TROPONINI <0.03   BNP (last 3 results)  Recent Labs  04/09/15 0532 05/20/15 0758  BNP 3065.0* 3449.0*    CBG:  Recent Labs Lab 07/16/15 0228  GLUCAP 133*    Recent Results (from the past 240 hour(s))  MRSA PCR Screening     Status: None   Collection Time: 07/16/15  2:42 AM  Result Value Ref Range Status   MRSA by PCR NEGATIVE NEGATIVE Final    Comment:        The GeneXpert MRSA Assay (FDA approved for NASAL specimens only), is one component of a comprehensive MRSA colonization surveillance program. It is not intended to diagnose MRSA infection nor to guide or monitor treatment for MRSA infections.      Studies: Dg Chest Port 1 View  07/15/2015  CLINICAL DATA:  Difficulty breathing.  Wheezing.  Dialysis patient. EXAM: PORTABLE CHEST 1 VIEW COMPARISON:  06/23/2015 FINDINGS: Cardiac enlargement with central pulmonary vascular congestion. Bilateral pleural  effusions with basilar infiltration, likely due to edema. Findings are mildly progressed since previous study. No pneumothorax. Mediastinal contours appear intact. Right central venous dialysis catheter present with lead tip over the cavoatrial junction. IMPRESSION: Cardiac enlargement with pulmonary vascular congestion, bilateral pleural effusions, and infiltration suggesting edema. Changes demonstrate mild progression since previous study. Electronically Signed   By: Lucienne Capers M.D.   On: 07/15/2015 23:53    Scheduled Meds: . amLODipine  5 mg Oral Daily  . antiseptic oral rinse  7 mL Mouth Rinse BID  . atorvastatin  40 mg Oral Daily  . docusate sodium  100 mg Oral BID  . famotidine  20 mg Oral Daily  . fluticasone  2 spray Each Nare Daily  . hydrALAZINE  25 mg Oral 3 times per day  . levothyroxine  88 mcg Oral QAC breakfast  . metoprolol tartrate  25 mg Oral 3 times per day  . multivitamin  1 tablet Oral Daily  . pantoprazole  40 mg Oral BID AC  . sodium chloride  1 spray Each Nare 5 X Daily  . sodium chloride flush  3 mL Intravenous Q12H  . sodium chloride flush  3 mL Intravenous Q12H    Assessment/Plan:  1. Acute on respiratory failure with hypoxia. Patient on 100% nonrebreather. I spoke with Dr. Severiano Gilbert nephrology. Patient will need another dialysis session today. He will take her down on first shift. Continue oxygen supplementation. Patient chronically wears 2 L nasal cannula. 2. Acute diastolic congestive heart failure, fluid overload. Patient will need a urgent dialysis session today to manage fluid. Patient states that she does not urinate much at all. 3. Essential hypertension. Continue usual medications 4. Hyperlipidemia unspecified continue atorvastatin and famotidine 5. Hypothyroidism unspecified continue levothyroxine 6. End-stage renal disease. Proceed with urgent dialysis today 7. History of COPD on chronic oxygen   Code Status:     Code Status Orders         Start     Ordered   07/16/15 0236  Full code   Continuous     07/16/15 0235    Code Status History    Date Active Date Inactive Code Status Order ID Comments User Context   06/15/2015  6:37 PM 06/24/2015  7:33 PM Full Code [id number]  Bettey Costa, MD Inpatient   05/20/2015 11:13 AM 06/01/2015  9:21 PM Full Code [id number]  Fritzi Mandes, MD Inpatient   04/09/2015 10:24 AM 04/22/2015  9:15 PM Full Code [id number]  Theodoro Grist, MD Inpatient     Family Communication: Husband at bedside  Disposition Plan: Home once breathing better  Consultants:  Nephrology  Time spent: 25 minutes  [facility]

## 2015-07-17 NOTE — Progress Notes (Signed)
[facility]  ROUNDING NOTE   Subjective:   Transferred to 2c yesterday. Did not receive any nebulizer treatments. Became short of breath and rapid response brought back to ICU.   Placed on emergent hemodialysis. Hemodialysis yesterday with UF of 1.5 litre   Objective:  Vital signs in last 24 hours:  Temp:  [97.9 F (36.6 C)-98.4 F (36.9 C)] 98.4 F (36.9 C) (04/19 0734) Pulse Rate:  [83-120] 120 (04/19 0924) Resp:  [11-22] 18 (04/19 0734) BP: (112-191)/(59-104) 191/104 mmHg (04/19 0924) SpO2:  [92 %-99 %] 99 % (04/19 0929) FiO2 (%):  [65 %] 65 % (04/19 0924) Weight:  [58 kg (127 lb 13.9 oz)-60.1 kg (132 lb 7.9 oz)] 58 kg (127 lb 13.9 oz) (04/18 1400)  Weight change: -3.404 kg (-7 lb 8.1 oz) Filed Weights   07/16/15 0220 07/16/15 1018 07/16/15 1400  Weight: 60.1 kg (132 lb 7.9 oz) 60.1 kg (132 lb 7.9 oz) 58 kg (127 lb 13.9 oz)    Intake/Output: I/O last 3 completed shifts: In: 0  Out: 2000 [Other:2000]   Intake/Output this shift:     Physical Exam: General: Critically ill  Head: Normocephalic, atraumatic. Moist oral mucosal membranes  Eyes: Anicteric  Neck: Supple, trachea midline  Lungs:  Bilateral crackles, Bipap  Heart: S1S2 no rubs  Abdomen:  Soft, nontender, BS present  Extremities: 1+ LE edema  Neurologic: Nonfocal, moving all four extremities  Skin: No lesions  Access: R IJ PC    Basic Metabolic Panel:  Recent Labs Lab 07/15/15 2307 07/16/15 0413  NA 133* 133*  K 3.6 3.4*  CL 97* 98*  CO2 27 26  GLUCOSE 187* 139*  BUN 27* 31*  CREATININE 4.69* 4.82*  CALCIUM 8.7* 8.7*    Liver Function Tests:  Recent Labs Lab 07/15/15 2307  AST 48*  ALT 23  ALKPHOS 118  BILITOT 0.6  PROT 7.2  ALBUMIN 3.2*   No results for input(s): LIPASE, AMYLASE in the last 168 hours. No results for input(s): AMMONIA in the last 168 hours.  CBC:  Recent Labs Lab 07/15/15 2307 07/16/15 0413  WBC 15.8* 7.4  NEUTROABS 8.4*  --   HGB 9.2* 7.5*   HCT 27.4* 21.9*  MCV 91.9 89.7  PLT 321 128*    Cardiac Enzymes:  Recent Labs Lab 07/15/15 2307  TROPONINI <0.03    BNP: Invalid input(s): POCBNP  CBG:  Recent Labs Lab 07/16/15 0228 07/17/15 0915  GLUCAP 133* 158*    Microbiology: Results for orders placed or performed during the hospital encounter of 07/15/15  MRSA PCR Screening     Status: None   Collection Time: 07/16/15  2:42 AM  Result Value Ref Range Status   MRSA by PCR NEGATIVE NEGATIVE Final    Comment:        The GeneXpert MRSA Assay (FDA approved for NASAL specimens only), is one component of a comprehensive MRSA colonization surveillance program. It is not intended to diagnose MRSA infection nor to guide or monitor treatment for MRSA infections.     Coagulation Studies: No results for input(s): LABPROT, INR in the last 72 hours.  Urinalysis: No results for input(s): COLORURINE, LABSPEC, PHURINE, GLUCOSEU, HGBUR, BILIRUBINUR, KETONESUR, PROTEINUR, UROBILINOGEN, NITRITE, LEUKOCYTESUR in the last 72 hours.  Invalid input(s): APPERANCEUR    Imaging: Dg Chest Port 1 View  07/15/2015  CLINICAL DATA:  Difficulty breathing.  Wheezing.  Dialysis patient. EXAM: PORTABLE CHEST 1 VIEW COMPARISON:  06/23/2015 FINDINGS: Cardiac enlargement with central pulmonary vascular congestion.  Bilateral pleural effusions with basilar infiltration, likely due to edema. Findings are mildly progressed since previous study. No pneumothorax. Mediastinal contours appear intact. Right central venous dialysis catheter present with lead tip over the cavoatrial junction. IMPRESSION: Cardiac enlargement with pulmonary vascular congestion, bilateral pleural effusions, and infiltration suggesting edema. Changes demonstrate mild progression since previous study. Electronically Signed   By: Lucienne Capers M.D.   On: 07/15/2015 23:53     Medications:     . amLODipine  5 mg Oral Daily  . antiseptic oral rinse  7 mL Mouth Rinse  BID  . atorvastatin  40 mg Oral Daily  . docusate sodium  100 mg Oral BID  . famotidine  20 mg Oral Daily  . fluticasone  2 spray Each Nare Daily  . hydrALAZINE  25 mg Oral 3 times per day  . levalbuterol  0.63 mg Nebulization Q4H  . levothyroxine  88 mcg Oral QAC breakfast  . metoprolol tartrate  25 mg Oral 3 times per day  . multivitamin  1 tablet Oral Daily  . pantoprazole  40 mg Oral BID AC  . sodium chloride  1 spray Each Nare 5 X Daily  . sodium chloride flush  3 mL Intravenous Q12H  . sodium chloride flush  3 mL Intravenous Q12H   sodium chloride, acetaminophen, albuterol, ALPRAZolam, chlorpheniramine-HYDROcodone, HYDROcodone-acetaminophen, magnesium hydroxide, ondansetron (ZOFRAN) IV, sodium chloride flush  Assessment/ Plan:  65 y.o. female with COPD, hypertension, hyperlipidemia, vaginal intraepithelial neoplasia, emergent AAA repair status post rupture on November 18, left to right femoral to femoral bypass , acute renal failure leading to permanent dialysis, significant 3 vessel disease, HIT positive, thrombocytopenia.  [facility] TTS [place]  1.  ESRD on HD:  Continue TTS schedule. Emergent dialysis this morning due respiratory failure. UF goal of 2.-3 litres.   2.  Anemia of CKD: with duodenal AVM  - epo with treatment.   3.  Secondary Hyperparathyroidism: not currently on binders. PTH low at 21 on 06/18/15  4. Hypertension with diastolic congestive heart failure: acute exacerbation.  Continues to have peripheral edema and pulmonary edema    LOS: [place], Janiah Devinney 4/19/20179:57 AM

## 2015-07-18 LAB — CBC
HCT: 19.3 % — ABNORMAL LOW (ref 35.0–47.0)
Hemoglobin: 6.5 g/dL — ABNORMAL LOW (ref 12.0–16.0)
MCH: 30.7 pg (ref 26.0–34.0)
MCHC: 33.7 g/dL (ref 32.0–36.0)
MCV: 91.2 fL (ref 80.0–100.0)
PLATELETS: 105 10*3/uL — AB (ref 150–440)
RBC: 2.12 MIL/uL — ABNORMAL LOW (ref 3.80–5.20)
RDW: 19.3 % — AB (ref 11.5–14.5)
WBC: 5 10*3/uL (ref 3.6–11.0)

## 2015-07-18 LAB — BASIC METABOLIC PANEL
ANION GAP: 9 (ref 5–15)
BUN: 19 mg/dL (ref 6–20)
CALCIUM: 8.9 mg/dL (ref 8.9–10.3)
CO2: 31 mmol/L (ref 22–32)
CREATININE: 2.87 mg/dL — AB (ref 0.44–1.00)
Chloride: 101 mmol/L (ref 101–111)
GFR calc Af Amer: 19 mL/min — ABNORMAL LOW (ref >60)
GFR, EST NON AFRICAN AMERICAN: 16 mL/min — AB (ref >60)
GLUCOSE: 84 mg/dL (ref 65–99)
Potassium: 3.6 mmol/L (ref 3.5–5.1)
Sodium: 141 mmol/L (ref 135–145)

## 2015-07-18 LAB — PREPARE RBC (CROSSMATCH)

## 2015-07-18 MED ORDER — EPOETIN ALFA 10000 UNIT/ML IJ SOLN: 10000.0000 [IU] | Freq: Once | INTRAMUSCULAR | Status: DC

## 2015-07-18 MED ORDER — SODIUM CHLORIDE 0.9 % IV SOLN: Freq: Once | INTRAVENOUS | Status: DC

## 2015-07-18 MED ORDER — EPOETIN ALFA 10000 UNIT/ML IJ SOLN
10000.0000 [IU] | Freq: Once | INTRAMUSCULAR | Status: AC
Start: 2015-07-18 — End: 2015-07-18
  Administered 2015-07-18: 10000 [IU] via SUBCUTANEOUS
  Filled 2015-07-18: qty 1

## 2015-07-18 NOTE — Progress Notes (Signed)
[facility]  ROUNDING NOTE   Subjective:   Seen and examined on hemodialysis. Reported some chest tightness and BFR lowered to 300.  UF goal of 2.5 litres.   Objective:  Vital signs in last 24 hours:  Temp:  [97.8 F (36.6 C)-98.4 F (36.9 C)] 97.9 F (36.6 C) (04/20 0424) Pulse Rate:  [68-100] 82 (04/20 0424) Resp:  [10-24] 17 (04/20 0424) BP: (70-140)/(47-72) 130/55 mmHg (04/20 0424) SpO2:  [95 %-100 %] 97 % (04/20 0424) FiO2 (%):  [28 %-65 %] 28 % (04/19 2318) Weight:  [55.3 kg (121 lb 14.6 oz)-56.564 kg (124 lb 11.2 oz)] 56.564 kg (124 lb 11.2 oz) (04/20 0424)  Weight change: -2.5 kg (-5 lb 8.2 oz) Filed Weights   07/17/15 1000 07/17/15 1330 07/18/15 0424  Weight: 57.6 kg (126 lb 15.8 oz) 55.3 kg (121 lb 14.6 oz) 56.564 kg (124 lb 11.2 oz)    Intake/Output: I/O last 3 completed shifts: In: 120 [P.O.:120] Out: 2450 [Other:2450]   Intake/Output this shift:  Total I/O In: 240 [P.O.:240] Out: -   Physical Exam: General: Ill appearing  Head: Normocephalic, atraumatic. Moist oral mucosal membranes  Eyes: Anicteric  Neck: Supple, trachea midline  Lungs:  Bilateral crackles, 2 L [place]  Heart: S1S2 no rubs  Abdomen:  Soft, nontender, BS present  Extremities: 1+ LE edema  Neurologic: Nonfocal, moving all four extremities  Skin: No lesions  Access: R IJ PC    Basic Metabolic Panel:  Recent Labs Lab 07/15/15 2307 07/16/15 0413 07/18/15 0530  NA 133* 133* 141  K 3.6 3.4* 3.6  CL 97* 98* 101  CO2 27 26 31   GLUCOSE 187* 139* 84  BUN 27* 31* 19  CREATININE 4.69* 4.82* 2.87*  CALCIUM 8.7* 8.7* 8.9    Liver Function Tests:  Recent Labs Lab 07/15/15 2307  AST 48*  ALT 23  ALKPHOS 118  BILITOT 0.6  PROT 7.2  ALBUMIN 3.2*   No results for input(s): LIPASE, AMYLASE in the last 168 hours. No results for input(s): AMMONIA in the last 168 hours.  CBC:  Recent Labs Lab 07/15/15 2307 07/16/15 0413 07/18/15 0530  WBC 15.8* 7.4 5.0   NEUTROABS 8.4*  --   --   HGB 9.2* 7.5* 6.5*  HCT 27.4* 21.9* 19.3*  MCV 91.9 89.7 91.2  PLT 321 128* 105*    Cardiac Enzymes:  Recent Labs Lab 07/15/15 2307  TROPONINI <0.03    BNP: Invalid input(s): POCBNP  CBG:  Recent Labs Lab 07/16/15 0228 07/17/15 0915  GLUCAP 133* 158*    Microbiology: Results for orders placed or performed during the hospital encounter of 07/15/15  MRSA PCR Screening     Status: None   Collection Time: 07/16/15  2:42 AM  Result Value Ref Range Status   MRSA by PCR NEGATIVE NEGATIVE Final    Comment:        The GeneXpert MRSA Assay (FDA approved for NASAL specimens only), is one component of a comprehensive MRSA colonization surveillance program. It is not intended to diagnose MRSA infection nor to guide or monitor treatment for MRSA infections.     Coagulation Studies: No results for input(s): LABPROT, INR in the last 72 hours.  Urinalysis: No results for input(s): COLORURINE, LABSPEC, PHURINE, GLUCOSEU, HGBUR, BILIRUBINUR, KETONESUR, PROTEINUR, UROBILINOGEN, NITRITE, LEUKOCYTESUR in the last 72 hours.  Invalid input(s): APPERANCEUR    Imaging: No results found.   Medications:     . sodium chloride   Intravenous Once  .  amLODipine  5 mg Oral Daily  . antiseptic oral rinse  7 mL Mouth Rinse BID  . atorvastatin  40 mg Oral Daily  . docusate sodium  100 mg Oral BID  . epoetin (EPOGEN/PROCRIT) injection  10,000 Units Intravenous Once  . famotidine  20 mg Oral Daily  . fluticasone  2 spray Each Nare Daily  . heparin subcutaneous  5,000 Units Subcutaneous Q8H  . hydrALAZINE  25 mg Oral 3 times per day  . levalbuterol  0.63 mg Nebulization Q4H  . levothyroxine  88 mcg Oral QAC breakfast  . metoprolol tartrate  25 mg Oral 3 times per day  . multivitamin  1 tablet Oral Daily  . pantoprazole  40 mg Oral BID AC  . sodium chloride  1 spray Each Nare 5 X Daily  . sodium chloride flush  3 mL Intravenous Q12H  . sodium  chloride flush  3 mL Intravenous Q12H   sodium chloride, acetaminophen, albuterol, ALPRAZolam, anticoagulant sodium citrate, chlorpheniramine-HYDROcodone, HYDROcodone-acetaminophen, magnesium hydroxide, ondansetron (ZOFRAN) IV, sodium chloride flush  Assessment/ Plan:  65 y.o. female with COPD, hypertension, hyperlipidemia, vaginal intraepithelial neoplasia, emergent AAA repair status post rupture on November 18, left to right femoral to femoral bypass , acute renal failure leading to permanent dialysis, significant 3 vessel disease, HIT positive, thrombocytopenia.  [facility] TTS [place]  1.  ESRD on HD:  Monitor daily for dialysis. Continue to have significant pulmonary edema  2.  Anemia of CKD: with duodenal AVM. Hemoglobin 6.5  - 2 units PRBC ordered today - epo with treatment.   3.  Secondary Hyperparathyroidism: not currently on binders. PTH low at 21 on 06/18/15  4. Hypertension with diastolic congestive heart failure: acute exacerbation.  Continues to have peripheral edema and pulmonary edema    LOS: 2 Anjelique Makar 4/20/201711:29 AM

## 2015-07-18 NOTE — Progress Notes (Signed)
Post dialysis assessment 

## 2015-07-18 NOTE — Progress Notes (Signed)
Patient ID: [name], female   DOB: 1950/04/21, 65 y.o.   MRN: [id number] [facility] PROGRESS NOTE  ASENETH HARRALSON [id number] DOB: Apr 12, 1950 DOA: 07/15/2015 PCP: Arnette Norris, MD  HPI/Subjective: Patient was seen earlier this morning. She was breathing much better than she was yesterday. She was on a nasal cannula and feeling better. Still has some shortness of breath but much improved from yesterday  Objective: Filed Vitals:   07/18/15 1400 07/18/15 1415  BP: 143/68 148/71  Pulse: 81 78  Temp:    Resp: 13 12    Filed Weights   07/17/15 1330 07/18/15 0424 07/18/15 1050  Weight: 55.3 kg (121 lb 14.6 oz) 56.564 kg (124 lb 11.2 oz) 54.7 kg (120 lb 9.5 oz)    ROS: Review of Systems  Constitutional: Negative for fever and chills.  Eyes: Negative for blurred vision.  Respiratory: Positive for cough and shortness of breath.   Cardiovascular: Negative for chest pain.  Gastrointestinal: Negative for nausea, vomiting, abdominal pain, diarrhea, constipation and blood in stool.  Genitourinary: Negative for dysuria.  Musculoskeletal: Negative for joint pain.  Neurological: Negative for dizziness and headaches.   Exam: Physical Exam  Constitutional: She is oriented to person, place, and time.  HENT:  Nose: No mucosal edema.  Mouth/Throat: No oropharyngeal exudate or posterior oropharyngeal edema.  Eyes: Conjunctivae, EOM and lids are normal. Pupils are equal, round, and reactive to light.  Neck: No JVD present. Carotid bruit is not present. No edema present. No thyroid mass and no thyromegaly present.  Cardiovascular: S1 normal and S2 normal.  Exam reveals no gallop.   No murmur heard. Pulses:      Dorsalis pedis pulses are 2+ on the right side, and 2+ on the left side.  Respiratory: No respiratory distress. She has no decreased breath sounds. She has no wheezes. She has no rhonchi. She has rales in the right lower field and the left lower field.  GI: Soft. Bowel  sounds are normal. There is no tenderness.  Musculoskeletal:       Right ankle: She exhibits swelling.       Left ankle: She exhibits swelling.  Lymphadenopathy:    She has no cervical adenopathy.  Neurological: She is alert and oriented to person, place, and time. No cranial nerve deficit.  Skin: Skin is warm. No rash noted. Nails show no clubbing.  Psychiatric: She has a normal mood and affect.    Data Reviewed: Basic Metabolic Panel:  Recent Labs Lab 07/15/15 2307 07/16/15 0413 07/18/15 0530  NA 133* 133* 141  K 3.6 3.4* 3.6  CL 97* 98* 101  CO2 27 26 31   GLUCOSE 187* 139* 84  BUN 27* 31* 19  CREATININE 4.69* 4.82* 2.87*  CALCIUM 8.7* 8.7* 8.9   Liver Function Tests:  Recent Labs Lab 07/15/15 2307  AST 48*  ALT 23  ALKPHOS 118  BILITOT 0.6  PROT 7.2  ALBUMIN 3.2*   CBC:  Recent Labs Lab 07/15/15 2307 07/16/15 0413 07/18/15 0530  WBC 15.8* 7.4 5.0  NEUTROABS 8.4*  --   --   HGB 9.2* 7.5* 6.5*  HCT 27.4* 21.9* 19.3*  MCV 91.9 89.7 91.2  PLT 321 128* 105*   Cardiac Enzymes:  Recent Labs Lab 07/15/15 2307  TROPONINI <0.03   BNP (last 3 results)  Recent Labs  04/09/15 0532 05/20/15 0758  BNP 3065.0* 3449.0*    CBG:  Recent Labs Lab 07/16/15 0228 07/17/15 0915  GLUCAP 133* 158*  Recent Results (from the past 240 hour(s))  MRSA PCR Screening     Status: None   Collection Time: 07/16/15  2:42 AM  Result Value Ref Range Status   MRSA by PCR NEGATIVE NEGATIVE Final    Comment:        The GeneXpert MRSA Assay (FDA approved for NASAL specimens only), is one component of a comprehensive MRSA colonization surveillance program. It is not intended to diagnose MRSA infection nor to guide or monitor treatment for MRSA infections.      Scheduled Meds: . sodium chloride   Intravenous Once  . amLODipine  5 mg Oral Daily  . antiseptic oral rinse  7 mL Mouth Rinse BID  . atorvastatin  40 mg Oral Daily  . docusate sodium  100 mg Oral  BID  . epoetin (EPOGEN/PROCRIT) injection  10,000 Units Subcutaneous Once  . famotidine  20 mg Oral Daily  . fluticasone  2 spray Each Nare Daily  . heparin subcutaneous  5,000 Units Subcutaneous Q8H  . hydrALAZINE  25 mg Oral 3 times per day  . levalbuterol  0.63 mg Nebulization Q4H  . levothyroxine  88 mcg Oral QAC breakfast  . metoprolol tartrate  25 mg Oral 3 times per day  . multivitamin  1 tablet Oral Daily  . pantoprazole  40 mg Oral BID AC  . sodium chloride  1 spray Each Nare 5 X Daily  . sodium chloride flush  3 mL Intravenous Q12H  . sodium chloride flush  3 mL Intravenous Q12H    Assessment/Plan:  1. Acute on respiratory failure with hypoxia. Patient required BiPAP yesterday and is now off BiPAP on nasal cannula. Patient will get dialysis again today and tomorrow. 2. Acute diastolic congestive heart failure, fluid overload. Since the patient does not urinate, dialysis is the only way to remove fluid. They will have to recalculate the patient's dry weight to keep the patient out of heart failure. 3. Anemia of chronic disease. Hemoglobin today 6.5. The patient will receive 2 units of packed red blood cells during dialysis today. This was discussed with nephrology and they will pull off extra fluid today. 4. Essential hypertension. Continue usual medications 5. Hyperlipidemia unspecified continue atorvastatin and famotidine 6. Hypothyroidism unspecified continue levothyroxine 7. End-stage renal disease. Proceed with urgent dialysis today 8. History of COPD on chronic oxygen   Code Status:     Code Status Orders        Start     Ordered   07/16/15 0236  Full code   Continuous     07/16/15 0235    Code Status History    Date Active Date Inactive Code Status Order ID Comments User Context   06/15/2015  6:37 PM 06/24/2015  7:33 PM Full Code [id number]  Bettey Costa, MD Inpatient   05/20/2015 11:13 AM 06/01/2015  9:21 PM Full Code [id number]  Fritzi Mandes, MD Inpatient   04/09/2015  10:24 AM 04/22/2015  9:15 PM Full Code [id number]  Theodoro Grist, MD Inpatient     Family Communication: Husband at bedside  Disposition Plan: Home once breathing better  Consultants:  Nephrology  Time spent: 24 minutes  [place], [facility]

## 2015-07-18 NOTE — Progress Notes (Signed)
Pre dialysis assessment 

## 2015-07-19 ENCOUNTER — Inpatient Hospital Stay: Payer: Medicare Other

## 2015-07-19 LAB — TYPE AND SCREEN
ABO/RH(D): B POS
Antibody Screen: NEGATIVE
Unit division: 0
Unit division: 0

## 2015-07-19 LAB — BASIC METABOLIC PANEL
ANION GAP: 8 (ref 5–15)
BUN: 12 mg/dL (ref 6–20)
CALCIUM: 8.8 mg/dL — AB (ref 8.9–10.3)
CO2: 32 mmol/L (ref 22–32)
Chloride: 102 mmol/L (ref 101–111)
Creatinine, Ser: 2.2 mg/dL — ABNORMAL HIGH (ref 0.44–1.00)
GFR calc Af Amer: 26 mL/min — ABNORMAL LOW (ref >60)
GFR, EST NON AFRICAN AMERICAN: 22 mL/min — AB (ref >60)
GLUCOSE: 86 mg/dL (ref 65–99)
Potassium: 3.1 mmol/L — ABNORMAL LOW (ref 3.5–5.1)
SODIUM: 142 mmol/L (ref 135–145)

## 2015-07-19 LAB — HEPATITIS B SURFACE ANTIGEN: HEP B S AG: NEGATIVE

## 2015-07-19 LAB — OCCULT BLOOD X 1 CARD TO LAB, STOOL: FECAL OCCULT BLD: NEGATIVE

## 2015-07-19 LAB — CK: Total CK: 24 U/L — ABNORMAL LOW (ref 38–234)

## 2015-07-19 LAB — HEMOGLOBIN: HEMOGLOBIN: 9.6 g/dL — AB (ref 12.0–16.0)

## 2015-07-19 MED ORDER — DEXTROSE 5 % IV SOLN
1.0000 g | Freq: Once | INTRAVENOUS | Status: AC
  Administered 2015-07-19: 1 g via INTRAVENOUS
  Filled 2015-07-19 (×2): qty 1

## 2015-07-19 MED ORDER — SODIUM CHLORIDE 0.9 % IV SOLN
220.0000 mg | INTRAVENOUS | Status: DC
  Administered 2015-07-19: 220 mg via INTRAVENOUS
  Filled 2015-07-19: qty 4.4

## 2015-07-19 MED ORDER — LEVALBUTEROL HCL 0.63 MG/3ML IN NEBU
0.6300 mg | INHALATION_SOLUTION | Freq: Three times a day (TID) | RESPIRATORY_TRACT | Status: DC
  Administered 2015-07-19 – 2015-07-23 (×11): 0.63 mg via RESPIRATORY_TRACT
  Filled 2015-07-19 (×12): qty 3

## 2015-07-19 MED ORDER — SODIUM CHLORIDE 0.9 % IV SOLN
420.0000 mg | INTRAVENOUS | Status: DC
  Filled 2015-07-19: qty 8.4

## 2015-07-19 MED ORDER — CEFTAZIDIME 1 G IJ SOLR
1.0000 g | INTRAMUSCULAR | Status: DC
  Filled 2015-07-19: qty 1

## 2015-07-19 NOTE — Discharge Instructions (Signed)
[facility] Failure Clinic appointment on Aug 01, 2015 at 10:00am with Darylene Price, [place]. Please call [phone] to reschedule.

## 2015-07-19 NOTE — Clinical Social Work Note (Signed)
MD stated patient to discharge more than likely over the weekend. CSW has spoken to [facility] administrator, Johnathon, and he is aware. Shela Leff MSW,LCSW [phone]

## 2015-07-19 NOTE — Progress Notes (Signed)
Pharmacy Antibiotic Note  [name] is a 65 y.o. female admitted on 07/15/2015 with Pseudomonas wound infection and VRE infected R. Groin graft.  Pharmacy has been consulted for daptomycin and ceftazadime dosing. Patient normally gets HD on T,Th, Sat and is follow by Dr. Ola Spurr outpatient.    Plan: Will start the patient on daptomycin 4mg /kg (220mg ) every 48 hours. Scheduled dose for 2000 in the evening to ensure it is given after HD on her HD days.   Will order ceftazidime 1gm once, then ceftazidime 1gm after every dialysis. Currently scheduled for 1800 on Sat, Tues, and Thurs- but will need to follow up daily incase patient gets additional HD.  Baseline CK has been ordered. Recommend checking weekly CK levels while on daptomycin.   Height: 5' 4.5" (163.8 cm) Weight: 118 lb 6.2 oz (53.7 kg) IBW/kg (Calculated) : 55.85  Temp (24hrs), Avg:98 F (36.7 C), Min:97.9 F (36.6 C), Max:98.3 F (36.8 C)   Recent Labs Lab 07/15/15 2307 07/16/15 0413 07/18/15 0530 07/19/15 0355  WBC 15.8* 7.4 5.0  --   CREATININE 4.69* 4.82* 2.87* 2.20*    Estimated Creatinine Clearance: 21.3 mL/min (by C-G formula based on Cr of 2.2).    Allergies  Allergen Reactions  . Asa [Aspirin] Other (See Comments)    unknown  . Plavix [Clopidogrel] Other (See Comments)    unknown    Antimicrobials this admission: 4/21 Daptomycin >>  4/21 Ceftazadime >>    Microbiology results: 4/21 Wound Cx: pending 4/18 MRSA PCR: negative  Thank you for allowing pharmacy to be a part of this patient's care.  Nancy Fetter, PharmD Pharmacy Resident  07/19/2015 4:26 PM

## 2015-07-19 NOTE — Progress Notes (Signed)
[facility]  ROUNDING NOTE   Subjective:   Seen and examined on hemodialysis. Fourth consecutive treatment. UF total of 6 litres   Patient is feeling better but with orthopenia.   Objective:  Vital signs in last 24 hours:  Temp:  [97.9 F (36.6 C)-98.3 F (36.8 C)] 98 F (36.7 C) (04/21 1200) Pulse Rate:  [76-89] 80 (04/21 1300) Resp:  [12-22] 17 (04/21 1300) BP: (129-165)/(59-90) 158/69 mmHg (04/21 1300) SpO2:  [95 %-100 %] 98 % (04/21 1331) Weight:  [51.1 kg (112 lb 10.5 oz)-53.7 kg (118 lb 6.2 oz)] 53.7 kg (118 lb 6.2 oz) (04/21 1200)  Weight change: -2.9 kg (-6 lb 6.3 oz) Filed Weights   07/18/15 1050 07/18/15 1415 07/19/15 1200  Weight: 54.7 kg (120 lb 9.5 oz) 51.1 kg (112 lb 10.5 oz) 53.7 kg (118 lb 6.2 oz)    Intake/Output: I/O last 3 completed shifts: In: 1030 [P.O.:420; Blood:610] Out: 1890 [Other:1890]   Intake/Output this shift:  Total I/O In: 240 [P.O.:240] Out: -   Physical Exam: General: Ill appearing  Head: Normocephalic, atraumatic. Moist oral mucosal membranes  Eyes: Anicteric  Neck: Supple, trachea midline  Lungs:  Bilateral crackles, 2 L [place]  Heart: S1S2 no rubs  Abdomen:  Soft, nontender, BS present  Extremities: 1+ LE edema  Neurologic: Nonfocal, moving all four extremities  Skin: No lesions  Access: R IJ PC    Basic Metabolic Panel:  Recent Labs Lab 07/15/15 2307 07/16/15 0413 07/18/15 0530 07/19/15 0355  NA 133* 133* 141 142  K 3.6 3.4* 3.6 3.1*  CL 97* 98* 101 102  CO2 27 26 31  32  GLUCOSE 187* 139* 84 86  BUN 27* 31* 19 12  CREATININE 4.69* 4.82* 2.87* 2.20*  CALCIUM 8.7* 8.7* 8.9 8.8*    Liver Function Tests:  Recent Labs Lab 07/15/15 2307  AST 48*  ALT 23  ALKPHOS 118  BILITOT 0.6  PROT 7.2  ALBUMIN 3.2*   No results for input(s): LIPASE, AMYLASE in the last 168 hours. No results for input(s): AMMONIA in the last 168 hours.  CBC:  Recent Labs Lab 07/15/15 2307 07/16/15 0413 07/18/15 0530  07/19/15 0355  WBC 15.8* 7.4 5.0  --   NEUTROABS 8.4*  --   --   --   HGB 9.2* 7.5* 6.5* 9.6*  HCT 27.4* 21.9* 19.3*  --   MCV 91.9 89.7 91.2  --   PLT 321 128* 105*  --     Cardiac Enzymes:  Recent Labs Lab 07/15/15 2307  TROPONINI <0.03    BNP: Invalid input(s): POCBNP  CBG:  Recent Labs Lab 07/16/15 0228 07/17/15 0915  GLUCAP 133* 158*    Microbiology: Results for orders placed or performed during the hospital encounter of 07/15/15  MRSA PCR Screening     Status: None   Collection Time: 07/16/15  2:42 AM  Result Value Ref Range Status   MRSA by PCR NEGATIVE NEGATIVE Final    Comment:        The GeneXpert MRSA Assay (FDA approved for NASAL specimens only), is one component of a comprehensive MRSA colonization surveillance program. It is not intended to diagnose MRSA infection nor to guide or monitor treatment for MRSA infections.     Coagulation Studies: No results for input(s): LABPROT, INR in the last 72 hours.  Urinalysis: No results for input(s): COLORURINE, LABSPEC, PHURINE, GLUCOSEU, HGBUR, BILIRUBINUR, KETONESUR, PROTEINUR, UROBILINOGEN, NITRITE, LEUKOCYTESUR in the last 72 hours.  Invalid input(s): APPERANCEUR  Imaging: No results found.   Medications:     . sodium chloride   Intravenous Once  . amLODipine  5 mg Oral Daily  . antiseptic oral rinse  7 mL Mouth Rinse BID  . atorvastatin  40 mg Oral Daily  . docusate sodium  100 mg Oral BID  . famotidine  20 mg Oral Daily  . fluticasone  2 spray Each Nare Daily  . heparin subcutaneous  5,000 Units Subcutaneous Q8H  . hydrALAZINE  25 mg Oral 3 times per day  . levalbuterol  0.63 mg Nebulization Q4H  . levothyroxine  88 mcg Oral QAC breakfast  . metoprolol tartrate  25 mg Oral 3 times per day  . multivitamin  1 tablet Oral Daily  . pantoprazole  40 mg Oral BID AC  . sodium chloride  1 spray Each Nare 5 X Daily  . sodium chloride flush  3 mL Intravenous Q12H  . sodium chloride  flush  3 mL Intravenous Q12H   sodium chloride, acetaminophen, albuterol, ALPRAZolam, anticoagulant sodium citrate, chlorpheniramine-HYDROcodone, HYDROcodone-acetaminophen, magnesium hydroxide, ondansetron (ZOFRAN) IV, sodium chloride flush  Assessment/ Plan:  65 y.o. female with COPD, hypertension, hyperlipidemia, vaginal intraepithelial neoplasia, emergent AAA repair status post rupture on November 18, left to right femoral to femoral bypass , acute renal failure leading to permanent dialysis, significant 3 vessel disease, HIT positive, thrombocytopenia.  [facility] Nephrology TTS [place]  1.  ESRD on HD:  Monitor daily for dialysis. Continue to have significant pulmonary edema - seen and examined on hemodialysis. Tolerating treatment well.   2.  Anemia of CKD: with duodenal AVM. Hemoglobin 9.6 - status post 2 units PRBC on 4/20 - epo with treatment TTS  3.  Secondary Hyperparathyroidism: not currently on binders. PTH low at 21 on 06/18/15  4. Hypertension with diastolic congestive heart failure: acute exacerbation.  Continues to have peripheral edema and pulmonary edema    LOS: [place], Jesi Jurgens 4/21/20171:45 PM

## 2015-07-19 NOTE — Care Management Important Message (Signed)
Important Message  Patient Details  Name: [name] MRN: [id number] Date of Birth: [birth year]   Medicare Important Message Given:  Yes    Beverly Sessions, RN 07/19/2015, 11:28 AM

## 2015-07-19 NOTE — Progress Notes (Signed)
Initial Heart Failure Clinic appointment scheduled for Aug 01, 2015 at 10:00am. Of note, she cancelled a previously scheduled appointment with [place]. Thank you.
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
## 2015-07-19 NOTE — Progress Notes (Signed)
Pt is in no distress Bipap is not in room and pt doesn't wish to wear BIPAP for sleep

## 2015-07-19 NOTE — Clinical Social Work Note (Signed)
Clinical Social Work Assessment  Patient Details  Name: [name] MRN: [id number] Date of Birth: Jan 04, 1951  Date of referral:  07/19/15               Reason for consult:  Facility Placement                Permission sought to share information with:  Facility Sport and exercise psychologist, Family Supports Permission granted to share information::  Yes, Verbal Permission Granted  Name::        Agency::     Relationship::     Contact Information:     Housing/Transportation Living arrangements for the past 2 months:  [place] of Information:  Facility, Patient Patient Interpreter Needed:  None Criminal Activity/Legal Involvement Pertinent to Current Situation/Hospitalization:  No - Comment as needed Significant Relationships:  Spouse Lives with:    Do you feel safe going back to the place where you live?  Yes Need for family participation in patient care:  No (Coment)  Care giving concerns:  Patient has been at [facility] for rehab.   Social Worker assessment / plan:  Patient is well known to this CSW due to frequent admissions. Patient has been at [facility] recovering and receiving rehab. She currently does not have a wound vac like she has had in the past. Patient is currently down for dialysis. Patient is pleased with [facility] in general and wishes to return to [facility] each time.   Employment status:  Retired Forensic scientist:  Medicare PT Recommendations:  Not assessed at this time Information / Referral to community resources:     Patient/Family's Response to care:  Patient is appreciative of CSW assistance.  Patient/Family's Understanding of and Emotional Response to Diagnosis, Current Treatment, and Prognosis:  Patient is aware of her illness and the chronic issues associated with it.   Emotional Assessment Appearance:  Appears stated age Attitude/Demeanor/Rapport:   (pleasant and cooperative) Affect (typically observed):  Accepting, Calm Orientation:   Oriented to Self, Oriented to Place, Oriented to  Time, Oriented to Situation Alcohol / Substance use:  Not Applicable Psych involvement (Current and /or in the community):  No (Comment)  Discharge Needs  Concerns to be addressed:  Care Coordination Readmission within the last 30 days:  Yes Current discharge risk:  None Barriers to Discharge:  No Barriers Identified   Shela Leff, LCSW 07/19/2015, 3:27 PM

## 2015-07-19 NOTE — Progress Notes (Signed)
Patient ID: [name], female   DOB: 05/20/1950, 65 y.o.   MRN: [id number] [facility] PROGRESS NOTE  KAISY JAKAB [id number] DOB: 02-01-1951 DOA: 07/15/2015 PCP: Arnette Norris, MD  HPI/Subjective: Patient is very nervous about going out of the hospital. She is better with her breathing but still has some shortness of breath. Unable to lie flat.  Objective: Filed Vitals:   07/19/15 1200 07/19/15 1230  BP: 165/90 163/73  Pulse: 80 80  Temp: 98 F (36.7 C)   Resp: 20 14    Filed Weights   07/18/15 1050 07/18/15 1415 07/19/15 1200  Weight: 54.7 kg (120 lb 9.5 oz) 51.1 kg (112 lb 10.5 oz) 53.7 kg (118 lb 6.2 oz)    ROS: Review of Systems  Constitutional: Negative for fever and chills.  Eyes: Negative for blurred vision.  Respiratory: Positive for cough and shortness of breath.   Cardiovascular: Negative for chest pain.  Gastrointestinal: Negative for nausea, vomiting, abdominal pain, diarrhea, constipation and blood in stool.  Genitourinary: Negative for dysuria.  Musculoskeletal: Negative for joint pain.  Neurological: Negative for dizziness and headaches.   Exam: Physical Exam  Constitutional: She is oriented to person, place, and time.  HENT:  Nose: No mucosal edema.  Mouth/Throat: No oropharyngeal exudate or posterior oropharyngeal edema.  Eyes: Conjunctivae, EOM and lids are normal. Pupils are equal, round, and reactive to light.  Neck: No JVD present. Carotid bruit is not present. No edema present. No thyroid mass and no thyromegaly present.  Cardiovascular: S1 normal and S2 normal.  Exam reveals no gallop.   No murmur heard. Pulses:      Dorsalis pedis pulses are 2+ on the right side, and 2+ on the left side.  Respiratory: No respiratory distress. She has decreased breath sounds in the right lower field and the left lower field. She has no wheezes. She has no rhonchi. She has no rales.  GI: Soft. Bowel sounds are normal. There is no tenderness.   Musculoskeletal:       Right ankle: She exhibits swelling.       Left ankle: She exhibits swelling.  Lymphadenopathy:    She has no cervical adenopathy.  Neurological: She is alert and oriented to person, place, and time. No cranial nerve deficit.  Skin: Skin is warm. No rash noted. Nails show no clubbing.  Psychiatric: She has a normal mood and affect.    Data Reviewed: Basic Metabolic Panel:  Recent Labs Lab 07/15/15 2307 07/16/15 0413 07/18/15 0530 07/19/15 0355  NA 133* 133* 141 142  K 3.6 3.4* 3.6 3.1*  CL 97* 98* 101 102  CO2 27 26 31  32  GLUCOSE 187* 139* 84 86  BUN 27* 31* 19 12  CREATININE 4.69* 4.82* 2.87* 2.20*  CALCIUM 8.7* 8.7* 8.9 8.8*   Liver Function Tests:  Recent Labs Lab 07/15/15 2307  AST 48*  ALT 23  ALKPHOS 118  BILITOT 0.6  PROT 7.2  ALBUMIN 3.2*   CBC:  Recent Labs Lab 07/15/15 2307 07/16/15 0413 07/18/15 0530 07/19/15 0355  WBC 15.8* 7.4 5.0  --   NEUTROABS 8.4*  --   --   --   HGB 9.2* 7.5* 6.5* 9.6*  HCT 27.4* 21.9* 19.3*  --   MCV 91.9 89.7 91.2  --   PLT 321 128* 105*  --    Cardiac Enzymes:  Recent Labs Lab 07/15/15 2307  TROPONINI <0.03   BNP (last 3 results)  Recent Labs  04/09/15 0532 05/20/15  0758  BNP 3065.0* 3449.0*    CBG:  Recent Labs Lab 07/16/15 0228 07/17/15 0915  GLUCAP 133* 158*    Recent Results (from the past 240 hour(s))  MRSA PCR Screening     Status: None   Collection Time: 07/16/15  2:42 AM  Result Value Ref Range Status   MRSA by PCR NEGATIVE NEGATIVE Final    Comment:        The GeneXpert MRSA Assay (FDA approved for NASAL specimens only), is one component of a comprehensive MRSA colonization surveillance program. It is not intended to diagnose MRSA infection nor to guide or monitor treatment for MRSA infections.      Scheduled Meds: . sodium chloride   Intravenous Once  . amLODipine  5 mg Oral Daily  . antiseptic oral rinse  7 mL Mouth Rinse BID  . atorvastatin   40 mg Oral Daily  . docusate sodium  100 mg Oral BID  . famotidine  20 mg Oral Daily  . fluticasone  2 spray Each Nare Daily  . heparin subcutaneous  5,000 Units Subcutaneous Q8H  . hydrALAZINE  25 mg Oral 3 times per day  . levalbuterol  0.63 mg Nebulization Q4H  . levothyroxine  88 mcg Oral QAC breakfast  . metoprolol tartrate  25 mg Oral 3 times per day  . multivitamin  1 tablet Oral Daily  . pantoprazole  40 mg Oral BID AC  . sodium chloride  1 spray Each Nare 5 X Daily  . sodium chloride flush  3 mL Intravenous Q12H  . sodium chloride flush  3 mL Intravenous Q12H    Assessment/Plan:  1. Acute on chronic respiratory failure with hypoxia. Patient on her usual nasal cannula 2. Acute diastolic congestive heart failure, fluid overload. Since the patient does not urinate, dialysis is the only way to remove fluid. Nephrology once to dialyze again today and tomorrow. They will speak with [facility] nephrology to try to figure out a way to keep this patient out of the hospital. Whether the patient has to be dialyzed 4 times a week or not will be determined. 3. Anemia of chronic disease. Status post 2 units of packed red blood cells for hemoglobin of 6.5. Hemoglobin came up to 9.6 4. Essential hypertension. Continue usual medications 5. Hyperlipidemia unspecified continue atorvastatin and famotidine 6. Hypothyroidism unspecified continue levothyroxine 7. End-stage renal disease. Proceed with urgent dialysis today 8. History of COPD on chronic oxygen   Code Status:     Code Status Orders        Start     Ordered   07/16/15 0236  Full code   Continuous     07/16/15 0235    Code Status History    Date Active Date Inactive Code Status Order ID Comments User Context   06/15/2015  6:37 PM 06/24/2015  7:33 PM Full Code [id number]  Bettey Costa, MD Inpatient   05/20/2015 11:13 AM 06/01/2015  9:21 PM Full Code [id number]  Fritzi Mandes, MD Inpatient   04/09/2015 10:24 AM 04/22/2015  9:15 PM Full Code  [id number]  Theodoro Grist, MD Inpatient     Family Communication: Family at bedside  Disposition Plan: Potentially back to rehabilitation after dialysis on Saturday  Consultants:  Nephrology  Time spent: 24 minutes  [place], [facility]

## 2015-07-19 NOTE — Progress Notes (Signed)
PRE TREATMENT ASSESSMENT

## 2015-07-19 NOTE — Progress Notes (Signed)
Pt is in no distress. Bipap order is prn. Pt doesn't need Bipap at this time. Bipap is not in pt's room

## 2015-07-19 NOTE — NC FL2 (Signed)
[place] LEVEL OF CARE SCREENING TOOL     IDENTIFICATION  Patient Name: [name] Birthdate: 01-01-1951 Sex: female Admission Date (Current Location): 07/15/2015  [facility] and [place] Number:  Engineering geologist and Address:  [facility], [address], [place], [place] [address]      Provider Number: [id number]  Attending Physician Name and Address:  Loletha Grayer, MD  Relative Name and Phone Number:       Current Level of Care: Hospital Recommended Level of Care: [place] Prior Approval Number:    Date Approved/Denied:   PASRR Number:    Discharge Plan: SNF    Current Diagnoses: Patient Active Problem List   Diagnosis Date Noted  . Fluid overload 07/16/2015  . Anemia 06/15/2015  . Pressure ulcer 05/30/2015  . Sepsis ([place]) 05/20/2015  . Bilateral pleural effusion 04/22/2015  . Anasarca 04/22/2015  . Seroma complicating procedure [id number]  . Thrombocytopenia ([place]) 04/22/2015  . Anemia of chronic disease 04/22/2015  . H/O blood transfusion reaction 04/22/2015  . HIT (heparin-induced thrombocytopenia) ([place]) 04/22/2015  . Generalized weakness 04/22/2015  . Malnutrition of moderate degree 04/19/2015  . Acute pulmonary edema with congestive heart failure ([place]) 04/09/2015  . Leukocytosis 04/09/2015  . Malignant essential hypertension 04/09/2015  . Dysphagia 04/09/2015  . Elevated troponin 04/09/2015  . Acute diastolic CHF (congestive heart failure) ([place]) 04/09/2015  . COPD exacerbation ([place]) 01/03/2015  . Alopecia 09/24/2014  . VAIN II (vaginal intraepithelial neoplasia grade II) 11/22/2013    Class: History of  . Postmenopausal HRT (hormone replacement therapy) 09/18/2013  . Pernicious anemia 09/18/2013  . Elevated liver function tests 09/14/2011  . Hypothyroidism 05/30/2010  . HLD (hyperlipidemia) 05/30/2010  . TOBACCO ABUSE 05/30/2010  . Essential hypertension 05/30/2010  . Abdominal  aortic aneurysm ([place]) 05/30/2010  . COPD 05/30/2010  . GERD 05/30/2010    Orientation RESPIRATION BLADDER Height & Weight     Self, Time, Situation, Place  O2 Incontinent Weight: 118 lb 6.2 oz (53.7 kg) Height:  5' 4.5" (163.8 cm)  BEHAVIORAL SYMPTOMS/MOOD NEUROLOGICAL BOWEL NUTRITION STATUS   (none)  (none) Incontinent Diet (renal diet/fluid restriction)  AMBULATORY STATUS COMMUNICATION OF NEEDS Skin   Extensive Assist Verbally PU Stage and Appropriate Care                       Personal Care Assistance Level of Assistance  Bathing, Dressing Bathing Assistance: Maximum assistance Feeding assistance: Maximum assistance Dressing Assistance: Maximum assistance     Functional Limitations Info             SPECIAL CARE FACTORS FREQUENCY   (dialysis)                    Contractures Contractures Info: Not present    Additional Factors Info                  Current Medications (07/19/2015):  This is the current hospital active medication list Current Facility-Administered Medications  Medication Dose Route Frequency Provider Last Rate Last Dose  . 0.9 %  sodium chloride infusion  250 mL Intravenous PRN Pavan Pyreddy, MD      . 0.9 %  sodium chloride infusion   Intravenous Once Harrie Foreman, MD      . acetaminophen (TYLENOL) tablet 650 mg  650 mg Oral Q6H PRN Saundra Shelling, MD   650 mg at 07/18/15 1517  . albuterol (PROVENTIL) (2.5 MG/3ML) 0.083% nebulizer  solution 2.5 mg  2.5 mg Nebulization Q6H PRN Saundra Shelling, MD   2.5 mg at 07/19/15 1331  . ALPRAZolam Duanne Moron) tablet 0.25 mg  0.25 mg Oral BID PRN Saundra Shelling, MD   0.25 mg at 07/18/15 2236  . amLODipine (NORVASC) tablet 5 mg  5 mg Oral Daily Saundra Shelling, MD   5 mg at 07/18/15 1518  . anticoagulant sodium citrate solution 5 mL  5 mL Intravenous Q dialysis Lavonia Dana, MD   5 mL at 07/18/15 1414  . antiseptic oral rinse (CPC / CETYLPYRIDINIUM CHLORIDE 0.05%) solution 7 mL  7 mL Mouth Rinse BID  Demetrios Loll, MD   7 mL at 07/19/15 1000  . atorvastatin (LIPITOR) tablet 40 mg  40 mg Oral Daily Saundra Shelling, MD   40 mg at 07/18/15 0950  . chlorpheniramine-HYDROcodone (TUSSIONEX) 10-8 MG/5ML suspension 5 mL  5 mL Oral Q12H PRN Pavan Pyreddy, MD      . docusate sodium (COLACE) capsule 100 mg  100 mg Oral BID Saundra Shelling, MD   100 mg at 07/16/15 0303  . famotidine (PEPCID) tablet 20 mg  20 mg Oral Daily Saundra Shelling, MD   20 mg at 07/18/15 0950  . fluticasone (FLONASE) 50 MCG/ACT nasal spray 2 spray  2 spray Each Nare Daily Saundra Shelling, MD   2 spray at 07/18/15 0953  . heparin injection 5,000 Units  5,000 Units Subcutaneous Q8H Vishal Mungal, MD   5,000 Units at 07/17/15 1648  . hydrALAZINE (APRESOLINE) tablet 25 mg  25 mg Oral 3 times per day Saundra Shelling, MD   25 mg at 07/19/15 0527  . HYDROcodone-acetaminophen (NORCO/VICODIN) 5-325 MG per tablet 1-2 tablet  1-2 tablet Oral Q6H PRN Saundra Shelling, MD   2 tablet at 07/19/15 0446  . levalbuterol (XOPENEX) nebulizer solution 0.63 mg  0.63 mg Nebulization Q4H Vishal Mungal, MD   0.63 mg at 07/19/15 0741  . levothyroxine (SYNTHROID, LEVOTHROID) tablet 88 mcg  88 mcg Oral QAC breakfast Saundra Shelling, MD   88 mcg at 07/18/15 0840  . magnesium hydroxide (MILK OF MAGNESIA) suspension 30 mL  30 mL Oral Daily PRN Saundra Shelling, MD      . metoprolol tartrate (LOPRESSOR) tablet 25 mg  25 mg Oral 3 times per day Saundra Shelling, MD   25 mg at 07/19/15 0527  . multivitamin (RENA-VIT) tablet 1 tablet  1 tablet Oral Daily Saundra Shelling, MD   1 tablet at 07/18/15 0950  . ondansetron (ZOFRAN) injection 4 mg  4 mg Intravenous Q6H PRN Pavan Pyreddy, MD      . pantoprazole (PROTONIX) EC tablet 40 mg  40 mg Oral BID AC Pavan Pyreddy, MD   40 mg at 07/18/15 1654  . sodium chloride (OCEAN) 0.65 % nasal spray 1 spray  1 spray Each Nare 5 X Daily Saundra Shelling, MD   1 spray at 07/19/15 0528  . sodium chloride flush (NS) 0.9 % injection 3 mL  3 mL Intravenous Q12H Pavan  Pyreddy, MD   3 mL at 07/19/15 1000  . sodium chloride flush (NS) 0.9 % injection 3 mL  3 mL Intravenous Q12H Pavan Pyreddy, MD   3 mL at 07/19/15 1000  . sodium chloride flush (NS) 0.9 % injection 3 mL  3 mL Intravenous PRN Saundra Shelling, MD         Discharge Medications: Please see discharge summary for a list of discharge medications.  Relevant Imaging Results:  Relevant Lab Results:   Additional Information  Shela Leff, [place]

## 2015-07-20 MED ORDER — DEXTROSE 5 % IV SOLN
2.0000 g | INTRAVENOUS | Status: DC
  Administered 2015-07-20: 2 g via INTRAVENOUS
  Filled 2015-07-20: qty 2

## 2015-07-20 MED ORDER — SODIUM CHLORIDE 0.9 % IV SOLN
500.0000 mg | INTRAVENOUS | Status: DC
  Administered 2015-07-21: 500 mg via INTRAVENOUS
  Filled 2015-07-20 (×2): qty 10

## 2015-07-20 MED ORDER — CEFTAZIDIME 1 G IJ SOLR
2.0000 g | INTRAMUSCULAR | Status: DC
  Filled 2015-07-20: qty 2

## 2015-07-20 MED ORDER — EPOETIN ALFA 10000 UNIT/ML IJ SOLN
10000.0000 [IU] | Freq: Once | INTRAMUSCULAR | Status: AC
  Administered 2015-07-20: 10000 [IU] via INTRAVENOUS

## 2015-07-20 NOTE — Progress Notes (Signed)
Post hd tx 

## 2015-07-20 NOTE — Progress Notes (Signed)
Hemodialysis completed. 

## 2015-07-20 NOTE — Progress Notes (Signed)
[facility]  ROUNDING NOTE   Subjective:   Seen and examined on hemodialysis. Fifth consecutive treatment. UF total of > 7 litres   CXR shows improvement in pulmonary edema and effusions.   ID has cultures her leaking seroma. Vascular to see today.   Objective:  Vital signs in last 24 hours:  Temp:  [97.9 F (36.6 C)-98.6 F (37 C)] 98 F (36.7 C) (04/22 1115) Pulse Rate:  [74-80] 80 (04/22 1215) Resp:  [10-22] 12 (04/22 1215) BP: (135-163)/(61-79) 163/65 mmHg (04/22 1200) SpO2:  [98 %-100 %] 100 % (04/22 1215) Weight:  [52.572 kg (115 lb 14.4 oz)] 52.572 kg (115 lb 14.4 oz) (04/22 1115)  Weight change: -1 kg (-2 lb 3.3 oz) Filed Weights   07/19/15 1200 07/20/15 0500 07/20/15 1115  Weight: 53.7 kg (118 lb 6.2 oz) 52.572 kg (115 lb 14.4 oz) 52.572 kg (115 lb 14.4 oz)    Intake/Output: I/O last 3 completed shifts: In: 440 [P.O.:240; IV Piggyback:200] Out: 1000 [Other:1000]   Intake/Output this shift:     Physical Exam: General: Ill appearing  Head: Normocephalic, atraumatic. Moist oral mucosal membranes  Eyes: Anicteric  Neck: Supple, trachea midline  Lungs:  Bilateral basilar crackles, 2 L [place]  Heart: S1S2 no rubs  Abdomen:  Soft, nontender, BS present  Extremities: 1+ LE edema  Neurologic: Nonfocal, moving all four extremities  Skin: No lesions  Access: R IJ PC    Basic Metabolic Panel:  Recent Labs Lab 07/15/15 2307 07/16/15 0413 07/18/15 0530 07/19/15 0355  NA 133* 133* 141 142  K 3.6 3.4* 3.6 3.1*  CL 97* 98* 101 102  CO2 27 26 31  32  GLUCOSE 187* 139* 84 86  BUN 27* 31* 19 12  CREATININE 4.69* 4.82* 2.87* 2.20*  CALCIUM 8.7* 8.7* 8.9 8.8*    Liver Function Tests:  Recent Labs Lab 07/15/15 2307  AST 48*  ALT 23  ALKPHOS 118  BILITOT 0.6  PROT 7.2  ALBUMIN 3.2*   No results for input(s): LIPASE, AMYLASE in the last 168 hours. No results for input(s): AMMONIA in the last 168 hours.  CBC:  Recent Labs Lab  07/15/15 2307 07/16/15 0413 07/18/15 0530 07/19/15 0355  WBC 15.8* 7.4 5.0  --   NEUTROABS 8.4*  --   --   --   HGB 9.2* 7.5* 6.5* 9.6*  HCT 27.4* 21.9* 19.3*  --   MCV 91.9 89.7 91.2  --   PLT 321 128* 105*  --     Cardiac Enzymes:  Recent Labs Lab 07/15/15 2307 07/19/15 1648  CKTOTAL  --  24*  TROPONINI <0.03  --     BNP: Invalid input(s): POCBNP  CBG:  Recent Labs Lab 07/16/15 0228 07/17/15 0915  GLUCAP 133* 158*    Microbiology: Results for orders placed or performed during the hospital encounter of 07/15/15  MRSA PCR Screening     Status: None   Collection Time: 07/16/15  2:42 AM  Result Value Ref Range Status   MRSA by PCR NEGATIVE NEGATIVE Final    Comment:        The GeneXpert MRSA Assay (FDA approved for NASAL specimens only), is one component of a comprehensive MRSA colonization surveillance program. It is not intended to diagnose MRSA infection nor to guide or monitor treatment for MRSA infections.   Wound culture     Status: None (Preliminary result)   Collection Time: 07/19/15  4:00 PM  Result Value Ref Range Status   Specimen  Description Groin, Right  Final   Special Requests Normal  Final   Gram Stain PENDING  Incomplete   Culture   Final    HEAVY GROWTH GRAM NEGATIVE RODS IDENTIFICATION AND SUSCEPTIBILITIES TO FOLLOW    Report Status PENDING  Incomplete    Coagulation Studies: No results for input(s): LABPROT, INR in the last 72 hours.  Urinalysis: No results for input(s): COLORURINE, LABSPEC, PHURINE, GLUCOSEU, HGBUR, BILIRUBINUR, KETONESUR, PROTEINUR, UROBILINOGEN, NITRITE, LEUKOCYTESUR in the last 72 hours.  Invalid input(s): APPERANCEUR    Imaging: Dg Chest 2 View  07/19/2015  CLINICAL DATA:  Pulmonary edema EXAM: CHEST  2 VIEW COMPARISON:  07/15/2015 chest radiograph. FINDINGS: Right internal jugular central venous catheter terminates in the lower third of the superior vena cava. Stable cardiomediastinal silhouette with  normal heart size. No pneumothorax. Small to moderate right and small left pleural effusions are decreased bilaterally. Mild residual pulmonary edema, decreased. Bibasilar lung opacities are decreased. Partially visualized stent graft in the upper abdominal aorta. IMPRESSION: 1. Mild residual pulmonary edema, decreased. 2. Small to moderate right and small left pleural effusions, decreased bilaterally. 3. Bibasilar lung opacities, decreased bilaterally, favor atelectasis. Electronically Signed   By: Ilona Sorrel M.D.   On: 07/19/2015 15:42     Medications:     . sodium chloride   Intravenous Once  . amLODipine  5 mg Oral Daily  . antiseptic oral rinse  7 mL Mouth Rinse BID  . cefTAZidime (FORTAZ)  IV  2 g Intravenous Q T,Th,Sa-HD  . [START ON 07/21/2015] DAPTOmycin (CUBICIN)  IV  500 mg Intravenous Q48H  . docusate sodium  100 mg Oral BID  . famotidine  20 mg Oral Daily  . fluticasone  2 spray Each Nare Daily  . heparin subcutaneous  5,000 Units Subcutaneous Q8H  . hydrALAZINE  25 mg Oral 3 times per day  . levalbuterol  0.63 mg Nebulization TID  . levothyroxine  88 mcg Oral QAC breakfast  . metoprolol tartrate  25 mg Oral 3 times per day  . multivitamin  1 tablet Oral Daily  . pantoprazole  40 mg Oral BID AC  . sodium chloride  1 spray Each Nare 5 X Daily  . sodium chloride flush  3 mL Intravenous Q12H  . sodium chloride flush  3 mL Intravenous Q12H   sodium chloride, acetaminophen, albuterol, ALPRAZolam, anticoagulant sodium citrate, chlorpheniramine-HYDROcodone, HYDROcodone-acetaminophen, magnesium hydroxide, ondansetron (ZOFRAN) IV, sodium chloride flush  Assessment/ Plan:  65 y.o. female with COPD, hypertension, hyperlipidemia, vaginal intraepithelial neoplasia, emergent AAA repair status post rupture on November 18, left to right femoral to femoral bypass , acute renal failure leading to permanent dialysis, significant 3 vessel disease, HIT positive, thrombocytopenia.  [facility]  Nephrology TTS [place]  1.  ESRD on HD:  Monitor daily for dialysis. Continue to have significant pulmonary edema - seen and examined on hemodialysis. Tolerating treatment well.   2.  Anemia of CKD: with duodenal AVM.  status post 2 units PRBC on 4/20 - epo with treatment TTS  3.  Secondary Hyperparathyroidism: not currently on binders. PTH low at 21 on 06/18/15  4. Hypertension with diastolic congestive heart failure: acute exacerbation.  Continues to have peripheral edema and pulmonary edema    LOS: [place], Linnea [place] 4/22/201712:15 PM

## 2015-07-20 NOTE — Progress Notes (Signed)
Pt stated she is allergic to heparin and refused 2:00 dose. Dr Juleen China notified and ordered to d/c heparin.

## 2015-07-20 NOTE — Progress Notes (Signed)
[facility] NAME: [name]    MRN#:  [id number]  DATE OF BIRTH:  11/25/1950  SUBJECTIVE:  Hospital Day: 4 days [name] is a 65 y.o. female presenting with Shortness of Breath .   Overnight events: No overnight events Interval Events: No real complaints still has drainage from right femoral seroma  REVIEW OF SYSTEMS:  CONSTITUTIONAL: No fever,Positive fatigue or weakness.  EYES: No blurred or double vision.  EARS, NOSE, AND THROAT: No tinnitus or ear pain.  RESPIRATORY: No cough, shortness of breath, wheezing or hemoptysis.  CARDIOVASCULAR: No chest pain, orthopnea, positive edema.  GASTROINTESTINAL: No nausea, vomiting, diarrhea or abdominal pain.  GENITOURINARY: No dysuria, hematuria.  ENDOCRINE: No polyuria, nocturia,  HEMATOLOGY: No anemia, easy bruising or bleeding SKIN: No rash or lesion. MUSCULOSKELETAL: No joint pain or arthritis.   NEUROLOGIC: No tingling, numbness, weakness.  PSYCHIATRY: No anxiety or depression.   DRUG ALLERGIES:   Allergies  Allergen Reactions  . Asa [Aspirin] Other (See Comments)    unknown  . Plavix [Clopidogrel] Other (See Comments)    unknown    VITALS:  Blood pressure 143/69, pulse 75, temperature 98 F (36.7 C), temperature source Oral, resp. rate 20, height 5' 4.5" (1.638 m), weight 52.572 kg (115 lb 14.4 oz), last menstrual period 03/30/1988, SpO2 98 %.  PHYSICAL EXAMINATION:  VITAL SIGNS: Filed Vitals:   07/19/15 2113 07/20/15 0437  BP: 145/61 143/69  Pulse: 74 75  Temp: 98.6 F (37 C) 98 F (36.7 C)  Resp: 15 20   GENERAL:64 y.o.female currently in no acute distress.  HEAD: Normocephalic, atraumatic.  EYES: Pupils equal, round, reactive to light. Extraocular muscles intact. No scleral icterus.  MOUTH: Moist mucosal membrane. Dentition intact. No abscess noted.  EAR, NOSE, THROAT: Clear without exudates. No external lesions.  NECK: Supple. No  thyromegaly. No nodules. No JVD.  PULMONARY: Clear to ascultation, without wheeze rails or rhonci. No use of accessory muscles, Good respiratory effort. good air entry bilaterally CHEST: Nontender to palpation.  CARDIOVASCULAR: S1 and S2. Regular rate and rhythm. No murmurs, rubs, or gallops. 2+ edema. Pedal pulses 2+ bilaterally.  GASTROINTESTINAL: Soft, nontender, nondistended. No masses. Positive bowel sounds. No hepatosplenomegaly.  MUSCULOSKELETAL: No swelling, clubbing, or edema. Range of motion full in all extremities.  NEUROLOGIC: Cranial nerves II through XII are intact. No gross focal neurological deficits. Sensation intact. Reflexes intact.  SKIN: No ulceration, lesions, rashes, or cyanosis. Skin warm and dry. Turgor intact.  PSYCHIATRIC: Mood, affect within normal limits. The patient is awake, alert and oriented x 3. Insight, judgment intact.      LABORATORY PANEL:   CBC  Recent Labs Lab 07/18/15 0530 07/19/15 0355  WBC 5.0  --   HGB 6.5* 9.6*  HCT 19.3*  --   PLT 105*  --    ------------------------------------------------------------------------------------------------------------------  Chemistries   Recent Labs Lab 07/15/15 2307  07/19/15 0355  NA 133*  < > 142  K 3.6  < > 3.1*  CL 97*  < > 102  CO2 27  < > 32  GLUCOSE 187*  < > 86  BUN 27*  < > 12  CREATININE 4.69*  < > 2.20*  CALCIUM 8.7*  < > 8.8*  AST 48*  --   --   ALT 23  --   --   ALKPHOS 118  --   --   BILITOT 0.6  --   --   < > =  values in this interval not displayed. ------------------------------------------------------------------------------------------------------------------  Cardiac Enzymes  Recent Labs Lab 07/15/15 2307  TROPONINI <0.03   ------------------------------------------------------------------------------------------------------------------  RADIOLOGY:  Dg Chest 2 View  07/19/2015  CLINICAL DATA:  Pulmonary edema EXAM: CHEST  2 VIEW COMPARISON:  07/15/2015 chest  radiograph. FINDINGS: Right internal jugular central venous catheter terminates in the lower third of the superior vena cava. Stable cardiomediastinal silhouette with normal heart size. No pneumothorax. Small to moderate right and small left pleural effusions are decreased bilaterally. Mild residual pulmonary edema, decreased. Bibasilar lung opacities are decreased. Partially visualized stent graft in the upper abdominal aorta. IMPRESSION: 1. Mild residual pulmonary edema, decreased. 2. Small to moderate right and small left pleural effusions, decreased bilaterally. 3. Bibasilar lung opacities, decreased bilaterally, favor atelectasis. Electronically Signed   By: Ilona Sorrel M.D.   On: 07/19/2015 15:42    EKG:   Orders placed or performed during the hospital encounter of 07/15/15  . EKG 12-Lead  . EKG 12-Lead    ASSESSMENT AND PLAN:   [name] is a 65 y.o. female presenting with Shortness of Breath . Admitted 07/15/2015 : Day #: 4 days 1. Acute on chronic respiratory failure with hypoxia. Patient on her usual nasal cannula 2. Acute diastolic congestive heart failure, nephrology input appreciated, repeat dialysis today 3. Anemia of chronic disease. Status post 2 units of packed red blood cells improved 4. Infected seroma: Continue antibiotics per ID, question wound VAC placement 5. Essential hypertension. Continue usual medications 6. Hyperlipidemia unspecified continue atorvastatin and famotidine 7. Hypothyroidism unspecified continue levothyroxine 8. End-stage renal disease. Proceed with urgent dialysis today 9. History of COPD on chronic oxygen   All the records are reviewed and case discussed with Care Management/Social Workerr. Management plans discussed with the patient, family and they are in agreement.  CODE STATUS: full TOTAL TIME TAKING CARE OF THIS PATIENT: 28 minutes.   POSSIBLE D/C IN 1DAYS, DEPENDING ON CLINICAL CONDITION.   [name],  [name].D on 07/20/2015 at  11:27 AM  Between 7am to 6pm - Pager - [phone]  After 6pm: House Pager: - [facility]  Office  [phone]  CC: Primary care physician; Jamie Norris, MD

## 2015-07-20 NOTE — Progress Notes (Signed)
Pre-hd tx 

## 2015-07-20 NOTE — Progress Notes (Signed)
Hemodialysis start 

## 2015-07-20 NOTE — Progress Notes (Signed)
Pharmacy Antibiotic Note  [name] is a 65 y.o. female admitted on 07/15/2015 with Pseudomonas wound infection and VRE infected R. Groin graft.  Pharmacy has been consulted for daptomycin and ceftazadime dosing. Patient normally gets HD on T,Th, Sat and is follow by Dr. Ola Spurr outpatient.    Plan: As an outpatient, was receiving ceftazidime 2gm IV QHD and daptomycin 500mg  IV QHD. Doses recommended by Placido Sou (ID pharmacist) during last admission.  Current orders for daptomycin 4mg /kg (220mg ) every 48 hours and ceftazidime 1gm after every dialysis.   Will change to ceftazidime 2gm IV QHD and daptomycin to 500mg  IV Q48H.   Discontinued atorvastatin. Recommend holding for duration of treatment with daptomycin. Recommend checking CK weekly.    Height: 5' 4.5" (163.8 cm) Weight: 115 lb 14.4 oz (52.572 kg) IBW/kg (Calculated) : 55.85  Temp (24hrs), Avg:98.1 F (36.7 C), Min:97.9 F (36.6 C), Max:98.6 F (37 C)   Recent Labs Lab 07/15/15 2307 07/16/15 0413 07/18/15 0530 07/19/15 0355  WBC 15.8* 7.4 5.0  --   CREATININE 4.69* 4.82* 2.87* 2.20*    Estimated Creatinine Clearance: 20.9 mL/min (by C-G formula based on Cr of 2.2).    Allergies  Allergen Reactions  . Asa [Aspirin] Other (See Comments)    unknown  . Plavix [Clopidogrel] Other (See Comments)    unknown    Antimicrobials this admission: 4/21 Daptomycin >>  4/21 Ceftazadime >>    Microbiology results: 4/21 Wound Cx: heavy GNR 4/18 MRSA PCR: negative  Thank you for allowing pharmacy to be a part of this patient's care.  Rexene Edison, PharmD Clinical Pharmacist  07/20/2015 10:08 AM

## 2015-07-21 MED ORDER — CIPROFLOXACIN HCL 500 MG PO TABS
500.0000 mg | ORAL_TABLET | Freq: Every day | ORAL | Status: DC
  Administered 2015-07-21 – 2015-07-22 (×2): 500 mg via ORAL
  Filled 2015-07-21 (×2): qty 1
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
## 2015-07-21 NOTE — Progress Notes (Signed)
Pharmacy Antibiotic Note  [name] is a 65 y.o. female admitted on 07/15/2015 with Pseudomonas wound infection and VRE infected R. Groin graft.  Pharmacy has been consulted for daptomycin and ceftazadime dosing. Patient normally gets HD on T,Th, Sat and is follow by Dr. Ola Spurr outpatient.    Plan: As an outpatient, was receiving ceftazidime 2gm IV QHD and daptomycin 500mg  IV QHD. Doses recommended by ID pharmacist during last admission.  Current orders for daptomycin 4mg /kg (220mg ) every 48 hours and ceftazidime 1gm after every dialysis.   Will change to ceftazidime 2gm IV QHD and daptomycin to 500mg  IV Q48H.   Discontinued atorvastatin. Recommend holding for duration of treatment with daptomycin. Recommend checking CK weekly.   4/23: Wound cultures now with enterobacter cloacae, resistant to ceftazidime, sensitive to cipro and imipenem Discussed with Dr. Lavetta Nielsen. In reviewing prior cultures the pseudomonas from February was also sensitive to cipro and imipenem. Dr Lavetta Nielsen would like to discontinue current orders for ceftazidime and start oral cipro. Ordered ciprofloxacin 500mg  PO Q24 to be given in the evening (after dialysis).   Height: 5' 4.5" (163.8 cm) Weight: 110 lb 3.7 oz (50 kg) IBW/kg (Calculated) : 55.85  Temp (24hrs), Avg:98.2 F (36.8 C), Min:98.1 F (36.7 C), Max:98.3 F (36.8 C)   Recent Labs Lab 07/15/15 2307 07/16/15 0413 07/18/15 0530 07/19/15 0355  WBC 15.8* 7.4 5.0  --   CREATININE 4.69* 4.82* 2.87* 2.20*    Estimated Creatinine Clearance: 19.9 mL/min (by C-G formula based on Cr of 2.2).    Allergies  Allergen Reactions  . Asa [Aspirin] Other (See Comments)    unknown  . Plavix [Clopidogrel] Other (See Comments)    unknown    Antimicrobials this admission: 4/21 Daptomycin >>  4/21 Ceftazadime >> 4/23 4/23 Cipro >>   Microbiology results: 4/21 Wound Cx: enterobacter cloacae, resistant to ceftazidime, sensitive to cipro and imipenem 4/18  MRSA PCR: negative  Thank you for allowing pharmacy to be a part of this patient's care.  Rexene Edison, PharmD Clinical Pharmacist  07/21/2015 3:35 PM

## 2015-07-21 NOTE — Progress Notes (Signed)
[facility]  ROUNDING NOTE   Subjective:   Hemodialysis yesterday. Tolerated treatment well.   ID has restarted antibiotics and patient is asking about a wound vac.    Objective:  Vital signs in last 24 hours:  Temp:  [97.9 F (36.6 C)-98.1 F (36.7 C)] 98.1 F (36.7 C) (04/22 2009) Pulse Rate:  [73-86] 73 (04/22 2009) Resp:  [10-20] 18 (04/22 2009) BP: (115-163)/(50-75) 130/66 mmHg (04/22 2055) SpO2:  [96 %-100 %] 96 % (04/22 2020) Weight:  [50 kg (110 lb 3.7 oz)-52.572 kg (115 lb 14.4 oz)] 50 kg (110 lb 3.7 oz) (04/22 1432)  Weight change: -1.128 kg (-2 lb 7.8 oz) Filed Weights   07/20/15 0500 07/20/15 1115 07/20/15 1432  Weight: 52.572 kg (115 lb 14.4 oz) 52.572 kg (115 lb 14.4 oz) 50 kg (110 lb 3.7 oz)    Intake/Output: I/O last 3 completed shifts: In: 440 [P.O.:240; IV Piggyback:200] Out: 2500 [Other:2500]   Intake/Output this shift:     Physical Exam: General: Ill appearing  Head: Normocephalic, atraumatic. Moist oral mucosal membranes  Eyes: Anicteric  Neck: Supple, trachea midline  Lungs:  Bilateral basilar crackles, 2 L [place]  Heart: S1S2 no rubs  Abdomen:  Soft, nontender, BS present  Extremities: no LE edema  Neurologic: Nonfocal, moving all four extremities  Skin: No lesions  Access: R IJ PC    Basic Metabolic Panel:  Recent Labs Lab 07/15/15 2307 07/16/15 0413 07/18/15 0530 07/19/15 0355  NA 133* 133* 141 142  K 3.6 3.4* 3.6 3.1*  CL 97* 98* 101 102  CO2 27 26 31  32  GLUCOSE 187* 139* 84 86  BUN 27* 31* 19 12  CREATININE 4.69* 4.82* 2.87* 2.20*  CALCIUM 8.7* 8.7* 8.9 8.8*    Liver Function Tests:  Recent Labs Lab 07/15/15 2307  AST 48*  ALT 23  ALKPHOS 118  BILITOT 0.6  PROT 7.2  ALBUMIN 3.2*   No results for input(s): LIPASE, AMYLASE in the last 168 hours. No results for input(s): AMMONIA in the last 168 hours.  CBC:  Recent Labs Lab 07/15/15 2307 07/16/15 0413 07/18/15 0530 07/19/15 0355  WBC 15.8* 7.4  5.0  --   NEUTROABS 8.4*  --   --   --   HGB 9.2* 7.5* 6.5* 9.6*  HCT 27.4* 21.9* 19.3*  --   MCV 91.9 89.7 91.2  --   PLT 321 128* 105*  --     Cardiac Enzymes:  Recent Labs Lab 07/15/15 2307 07/19/15 1648  CKTOTAL  --  24*  TROPONINI <0.03  --     BNP: Invalid input(s): POCBNP  CBG:  Recent Labs Lab 07/16/15 0228 07/17/15 0915  GLUCAP 133* 158*    Microbiology: Results for orders placed or performed during the hospital encounter of 07/15/15  MRSA PCR Screening     Status: None   Collection Time: 07/16/15  2:42 AM  Result Value Ref Range Status   MRSA by PCR NEGATIVE NEGATIVE Final    Comment:        The GeneXpert MRSA Assay (FDA approved for NASAL specimens only), is one component of a comprehensive MRSA colonization surveillance program. It is not intended to diagnose MRSA infection nor to guide or monitor treatment for MRSA infections.   Wound culture     Status: None (Preliminary result)   Collection Time: 07/19/15  4:00 PM  Result Value Ref Range Status   Specimen Description Groin, Right  Final   Special Requests Normal  Final   Gram Stain PENDING  Incomplete   Culture HEAVY GROWTH ENTEROBACTER CLOACAE  Final   Report Status PENDING  Incomplete   Organism ID, Bacteria ENTEROBACTER CLOACAE  Final      Susceptibility   Enterobacter cloacae - MIC*    PIP/TAZO Value in next row Resistant      RESISTANT>=128    CEFTAZIDIME Value in next row Resistant      RESISTANT>=64    CEFTRIAXONE Value in next row Resistant      RESISTANT>=64    CEFEPIME Value in next row Resistant      RESISTANT>=64    IMIPENEM Value in next row Sensitive      SENSITIVE0.5    GENTAMICIN Value in next row Sensitive      SENSITIVE<=1    CIPROFLOXACIN Value in next row Sensitive      SENSITIVE<=0.25    TRIMETH/SULFA Value in next row Sensitive      SENSITIVE<=20    * HEAVY GROWTH ENTEROBACTER CLOACAE    Coagulation Studies: No results for input(s): LABPROT, INR in the  last 72 hours.  Urinalysis: No results for input(s): COLORURINE, LABSPEC, PHURINE, GLUCOSEU, HGBUR, BILIRUBINUR, KETONESUR, PROTEINUR, UROBILINOGEN, NITRITE, LEUKOCYTESUR in the last 72 hours.  Invalid input(s): APPERANCEUR    Imaging: Dg Chest 2 View  07/19/2015  CLINICAL DATA:  Pulmonary edema EXAM: CHEST  2 VIEW COMPARISON:  07/15/2015 chest radiograph. FINDINGS: Right internal jugular central venous catheter terminates in the lower third of the superior vena cava. Stable cardiomediastinal silhouette with normal heart size. No pneumothorax. Small to moderate right and small left pleural effusions are decreased bilaterally. Mild residual pulmonary edema, decreased. Bibasilar lung opacities are decreased. Partially visualized stent graft in the upper abdominal aorta. IMPRESSION: 1. Mild residual pulmonary edema, decreased. 2. Small to moderate right and small left pleural effusions, decreased bilaterally. 3. Bibasilar lung opacities, decreased bilaterally, favor atelectasis. Electronically Signed   By: Ilona Sorrel M.D.   On: 07/19/2015 15:42     Medications:     . sodium chloride   Intravenous Once  . amLODipine  5 mg Oral Daily  . antiseptic oral rinse  7 mL Mouth Rinse BID  . cefTAZidime (FORTAZ)  IV  2 g Intravenous Q T,Th,Sa-HD  . DAPTOmycin (CUBICIN)  IV  500 mg Intravenous Q48H  . docusate sodium  100 mg Oral BID  . famotidine  20 mg Oral Daily  . fluticasone  2 spray Each Nare Daily  . hydrALAZINE  25 mg Oral 3 times per day  . levalbuterol  0.63 mg Nebulization TID  . levothyroxine  88 mcg Oral QAC breakfast  . metoprolol tartrate  25 mg Oral 3 times per day  . multivitamin  1 tablet Oral Daily  . pantoprazole  40 mg Oral BID AC  . sodium chloride  1 spray Each Nare 5 X Daily  . sodium chloride flush  3 mL Intravenous Q12H  . sodium chloride flush  3 mL Intravenous Q12H   sodium chloride, acetaminophen, albuterol, ALPRAZolam, anticoagulant sodium citrate,  chlorpheniramine-HYDROcodone, HYDROcodone-acetaminophen, magnesium hydroxide, ondansetron (ZOFRAN) IV, sodium chloride flush  Assessment/ Plan:  65 y.o. female with COPD, hypertension, hyperlipidemia, vaginal intraepithelial neoplasia, emergent AAA repair status post rupture on November 18, left to right femoral to femoral bypass , acute renal failure leading to permanent dialysis, significant 3 vessel disease, HIT positive, thrombocytopenia.  [facility]phrology TTS [place]  1.  ESRD on HD:  Monitor daily for dialysis. Continue to have  significant pulmonary edema. Has received five consecutive days of hemodialysis with total ultrafiltration of >9 litres.  - evaluate daily for dialysis. Next treatment for Tuesday.   2.  Anemia of CKD: with duodenal AVM.  status post 2 units PRBC on 4/20 - epo with treatment TTS  3.  Secondary Hyperparathyroidism: not currently on binders. PTH low at 21 on 06/18/15  4. Hypertension with diastolic congestive heart failure: acute exacerbation.  - amlodipine, hydralazine and metoprolol  5. Infected seroma: now leaking again. Restarted on ceftaz and dapto. Will need to know the end date for these antibiotics as per ID and vascular.    LOS: 5 Chairty Toman 4/23/201710:40 AM

## 2015-07-21 NOTE — Evaluation (Signed)
Physical Therapy Evaluation Patient Details Name: [name] MRN: [id number] DOB: 1950-12-07 Today's Date: 07/21/2015   History of Present Illness  65 y.o. female with a known history of End-stage renal disease on dialysis, hypertension, hyperlipidemia, GERD, abdominal aortic aneurysm, diastolic heart failure presented to the emergency room for difficulty breathing.  She apparently had severe LE swelling and has had a lot of fluid taken off in 5 days of dialysis.  She had an aneurysm ~5 months ago and has been working on getting back to transfers/standing etc at rehab.  Clinical Impression  Pt is highly motivated to work with PT (here and at rehab) gain strength and get back to walking.  She has considerable ankle DF limitations with poor ROM and this does appear to be one of her big limiters regarding ability to get to/maintain standing.  Instructed pt and family on proper achilles stretches for ankle DF and encouraged other biomechanics/positioning to encourage more mobility.  Pt with good upper body strength and was able to maneuver in bed fairly well.  Follow Up Recommendations      Equipment Recommendations       Recommendations for Other Services       Precautions / Restrictions Precautions Precautions: Fall Restrictions Weight Bearing Restrictions: No      Mobility  Bed Mobility Overal bed mobility: Needs Assistance Bed Mobility: Supine to Sit;Sit to Supine     Supine to sit: Min guard Sit to supine: Min assist;Min guard   General bed mobility comments: Pt shows great effort with bed mobility and is able to use UEs to assist LE movement  Transfers Overall transfer level: Needs assistance Equipment used: Rolling walker (2 wheeled) Transfers: Sit to/from Stand Sit to Stand: Mod assist;Max assist         General transfer comment: Pt's rigid plantar flexion forces her into knee hyperextension and heavy reliance on the walker.  She is able to briefly keep  herself up but the mechanics are a huge issue.   Ambulation/Gait             General Gait Details: Pt is unable to ambulate, but with some assist with back of legs on bed to support herself she can move each leg a little to the side (less successful with attempts at forward stepping)  Stairs            Wheelchair Mobility    Modified Rankin (Stroke Patients Only)       Balance Overall balance assessment: Needs assistance     Sitting balance - Comments: Pt able to sit up at EOB w/o issue, good balance       Standing balance comment: Pt is highly reliant on UEs to maintain standing balance and needs at least min assist the entire time (except when she was able to prop back of legs on bed behind her)                             Pertinent Vitals/Pain Pain Assessment: No/denies pain    Home Living Family/patient expects to be discharged to:: Skilled nursing facility                      Prior Function Level of Independence: Needs assistance   Gait / Transfers Assistance Needed: Pt has been unable to ambulate, needing a sliding board for transfers           Hand Dominance  Extremity/Trunk Assessment   Upper Extremity Assessment: Overall WFL for tasks assessed           Lower Extremity Assessment: RLE deficits/detail;LLE deficits/detail RLE Deficits / Details: grossly 2+ to 3-/5, ankle plantar flexed to more than 15 degrees from neutral LLE Deficits / Details: grossly 3- to 3+/5 t/o, ankle plantar flexed ~10 degrees from neutral     Communication   Communication: No difficulties  Cognition Arousal/Alertness: Awake/alert Behavior During Therapy: WFL for tasks assessed/performed Overall Cognitive Status: Within Functional Limits for tasks assessed                      General Comments      Exercises General Exercises - Lower Extremity Ankle Circles/Pumps: AAROM;10 reps (calf stretches 3 X 20-30 sec b/l,  deducated family) Heel Slides: AAROM;Strengthening;Right;Left;10 reps Hip ABduction/ADduction: AAROM;Strengthening;10 reps;Left;Right Straight Leg Raises: AROM;AAROM;Right;Left;10 reps      Assessment/Plan    PT Assessment Patient needs continued PT services  PT Diagnosis Difficulty walking;Generalized weakness   PT Problem List Decreased strength;Decreased range of motion;Decreased activity tolerance;Decreased balance;Decreased mobility;Decreased coordination;Decreased safety awareness  PT Treatment Interventions Therapeutic exercise;Therapeutic activities;Functional mobility training;Stair training;Gait training;Balance training;Neuromuscular re-education;Patient/family education;DME instruction   PT Goals (Current goals can be found in the Care Plan section) Acute Rehab PT Goals Patient Stated Goal: I just want to be able to transfer myself and do a little bit of walking PT Goal Formulation: With patient Time For Goal Achievement: 08/03/15 Potential to Achieve Goals: Fair    Frequency Min 2X/week   Barriers to discharge        Co-evaluation               End of Session Equipment Utilized During Treatment: Gait belt Activity Tolerance: Patient tolerated treatment well Patient left: with bed alarm set;with call bell/phone within reach;with family/visitor present Nurse Communication: Mobility status         Time: 1347-1415 PT Time Calculation (min) (ACUTE ONLY): 28 min   Charges:   PT Evaluation $PT Eval Moderate Complexity: 1 Procedure PT Treatments $Therapeutic Exercise: 8-22 mins   PT G Codes:       Wayne Both, PT, DPT [phone]  Kreg Shropshire 07/21/2015, 3:56 PM

## 2015-07-21 NOTE — Progress Notes (Signed)
[facility] [place] NAME: [name]    MRN#:  [id number]  DATE OF BIRTH:  31-Jan-1951  SUBJECTIVE:  Hospital Day: 5 days [name] is a 65 y.o. female presenting with Shortness of Breath .   Overnight events: No overnight events Interval Events: No real complaints still has drainage from right femoral seroma  REVIEW OF SYSTEMS:  CONSTITUTIONAL: No fever,Positive fatigue or weakness.  EYES: No blurred or double vision.  EARS, NOSE, AND THROAT: No tinnitus or ear pain.  RESPIRATORY: No cough, shortness of breath, wheezing or hemoptysis.  CARDIOVASCULAR: No chest pain, orthopnea, positive edema.  GASTROINTESTINAL: No nausea, vomiting, diarrhea or abdominal pain.  GENITOURINARY: No dysuria, hematuria.  ENDOCRINE: No polyuria, nocturia,  HEMATOLOGY: No anemia, easy bruising or bleeding SKIN: No rash or lesion. MUSCULOSKELETAL: No joint pain or arthritis.   NEUROLOGIC: No tingling, numbness, weakness.  PSYCHIATRY: No anxiety or depression.   DRUG ALLERGIES:   Allergies  Allergen Reactions  . Asa [Aspirin] Other (See Comments)    unknown  . Plavix [Clopidogrel] Other (See Comments)    unknown    VITALS:  Blood pressure 130/66, pulse 73, temperature 98.1 F (36.7 C), temperature source Oral, resp. rate 18, height 5' 4.5" (1.638 m), weight 50 kg (110 lb 3.7 oz), last menstrual period 03/30/1988, SpO2 96 %.  PHYSICAL EXAMINATION:  VITAL SIGNS: Filed Vitals:   07/20/15 2009 07/20/15 2055  BP: 115/50 130/66  Pulse: 73   Temp: 98.1 F (36.7 C)   Resp: 89    GENERAL:64 y.o.female currently in no acute distress.  HEAD: Normocephalic, atraumatic.  EYES: Pupils equal, round, reactive to light. Extraocular muscles intact. No scleral icterus.  MOUTH: Moist mucosal membrane. Dentition intact. No abscess noted.  EAR, NOSE, THROAT: Clear without exudates. No external lesions.  NECK: Supple. No thyromegaly. No nodules.  No JVD.  PULMONARY: Clear to ascultation, without wheeze rails or rhonci. No use of accessory muscles, Good respiratory effort. good air entry bilaterally CHEST: Nontender to palpation.  CARDIOVASCULAR: S1 and S2. Regular rate and rhythm. No murmurs, rubs, or gallops. No edema. Pedal pulses 2+ bilaterally.  GASTROINTESTINAL: Soft, nontender, nondistended. No masses. Positive bowel sounds. No hepatosplenomegaly.  MUSCULOSKELETAL: No swelling, clubbing, or edema. Range of motion full in all extremities.  NEUROLOGIC: Cranial nerves II through XII are intact. No gross focal neurological deficits. Sensation intact. Reflexes intact.  SKIN: Right groin seroma fair amount of serous drainage, minimal erythema surrounding otherwise No ulceration, lesions, rashes, or cyanosis. Skin warm and dry. Turgor intact.  PSYCHIATRIC: Mood, affect within normal limits. The patient is awake, alert and oriented x 3. Insight, judgment intact.      LABORATORY PANEL:   CBC  Recent Labs Lab 07/18/15 0530 07/19/15 0355  WBC 5.0  --   HGB 6.5* 9.6*  HCT 19.3*  --   PLT 105*  --    ------------------------------------------------------------------------------------------------------------------  Chemistries   Recent Labs Lab 07/15/15 2307  07/19/15 0355  NA 133*  < > 142  K 3.6  < > 3.1*  CL 97*  < > 102  CO2 27  < > 32  GLUCOSE 187*  < > 86  BUN 27*  < > 12  CREATININE 4.69*  < > 2.20*  CALCIUM 8.7*  < > 8.8*  AST 48*  --   --   ALT 23  --   --   ALKPHOS 118  --   --   BILITOT  0.6  --   --   < > = values in this interval not displayed. ------------------------------------------------------------------------------------------------------------------  Cardiac Enzymes  Recent Labs Lab 07/15/15 2307  TROPONINI <0.03   ------------------------------------------------------------------------------------------------------------------  RADIOLOGY:  Dg Chest 2 View  07/19/2015  CLINICAL DATA:   Pulmonary edema EXAM: CHEST  2 VIEW COMPARISON:  07/15/2015 chest radiograph. FINDINGS: Right internal jugular central venous catheter terminates in the lower third of the superior vena cava. Stable cardiomediastinal silhouette with normal heart size. No pneumothorax. Small to moderate right and small left pleural effusions are decreased bilaterally. Mild residual pulmonary edema, decreased. Bibasilar lung opacities are decreased. Partially visualized stent graft in the upper abdominal aorta. IMPRESSION: 1. Mild residual pulmonary edema, decreased. 2. Small to moderate right and small left pleural effusions, decreased bilaterally. 3. Bibasilar lung opacities, decreased bilaterally, favor atelectasis. Electronically Signed   By: Ilona Sorrel M.D.   On: 07/19/2015 15:42    EKG:   Orders placed or performed during the hospital encounter of 07/15/15  . EKG 12-Lead  . EKG 12-Lead    ASSESSMENT AND PLAN:   [name] is a 65 y.o. female presenting with Shortness of Breath . Admitted 07/15/2015 : Day #: 5 days 1. Acute on chronic respiratory failure with hypoxia. Improved Patient on her usual nasal cannula 2. Acute diastolic congestive heart failure, nephrology input appreciated improved 3. Anemia of chronic disease. Status post 2 units of packed red blood cells improved 4. Infected seroma: Patient's antibiotics were changed to the discontinued 07/19/2015 have her restart by inpatient disease question about continuation of antibiotics and stop time, vascular surgery consult wound VAC placement 5. Essential hypertension. Continue usual medications 6. Hyperlipidemia unspecified continue atorvastatin and famotidine 7. Hypothyroidism unspecified continue levothyroxine 8. End-stage renal disease. Per nephrology 9. History of COPD on chronic oxygen   All the records are reviewed and case discussed with Care Management/Social Workerr. Management plans discussed with the patient, family and they are  in agreement.  CODE STATUS: full TOTAL TIME TAKING CARE OF THIS PATIENT: 28 minutes.   POSSIBLE D/C IN 1DAYS, DEPENDING ON CLINICAL CONDITION.   Hower,  Karenann Cai.D on 07/21/2015 at 11:05 AM  Between 7am to 6pm - Pager - [phone]  After 6pm: House Pager: - [phone]  [facility]  Office  [phone]  CC: Primary care physician; Arnette Norris, MD

## 2015-07-22 ENCOUNTER — Ambulatory Visit: Payer: Medicare Other | Admitting: Hematology and Oncology

## 2015-07-22 ENCOUNTER — Other Ambulatory Visit: Payer: Medicare Other

## 2015-07-22 DIAGNOSIS — J810 Acute pulmonary edema: Secondary | ICD-10-CM

## 2015-07-22 DIAGNOSIS — R0902 Hypoxemia: Secondary | ICD-10-CM

## 2015-07-22 NOTE — Progress Notes (Signed)
[facility]  ROUNDING NOTE   Subjective:   Greater than 7 liters neg for this admission Breathing is fine today No acute c/o    Objective:  Vital signs in last 24 hours:  Temp:  [98 F (36.7 C)-98.7 F (37.1 C)] 98.1 F (36.7 C) (04/24 1336) Pulse Rate:  [69-79] 75 (04/24 1336) Resp:  [17-20] 17 (04/24 1336) BP: (142-149)/(62-67) 145/64 mmHg (04/24 1336) SpO2:  [96 %-99 %] 99 % (04/24 1344) Weight:  [54.25 kg (119 lb 9.6 oz)] 54.25 kg (119 lb 9.6 oz) (04/24 0526)  Weight change: 1.678 kg (3 lb 11.2 oz) Filed Weights   07/20/15 1115 07/20/15 1432 07/22/15 0526  Weight: 52.572 kg (115 lb 14.4 oz) 50 kg (110 lb 3.7 oz) 54.25 kg (119 lb 9.6 oz)    Intake/Output: I/O last 3 completed shifts: In: 360 [P.O.:360] Out: 0    Intake/Output this shift:  Total I/O In: 116 [I.V.:6; IV Piggyback:110] Out: -   Physical Exam: General: Chronically Ill appearing  Head: Normocephalic, atraumatic. Moist oral mucosal membranes  Eyes: Anicteric  Neck: Supple, trachea midline  Lungs:  Decreased breath sounds at bases, 2 L [place]  Heart: S1S2 no rubs  Abdomen:  Soft, nontender, BS present  Extremities:  LE edema over thigs  Neurologic: Nonfocal, moving all four extremities  Skin: No lesions  Access: R IJ PC    Basic Metabolic Panel:  Recent Labs Lab 07/15/15 2307 07/16/15 0413 07/18/15 0530 07/19/15 0355  NA 133* 133* 141 142  K 3.6 3.4* 3.6 3.1*  CL 97* 98* 101 102  CO2 27 26 31  32  GLUCOSE 187* 139* 84 86  BUN 27* 31* 19 12  CREATININE 4.69* 4.82* 2.87* 2.20*  CALCIUM 8.7* 8.7* 8.9 8.8*    Liver Function Tests:  Recent Labs Lab 07/15/15 2307  AST 48*  ALT 23  ALKPHOS 118  BILITOT 0.6  PROT 7.2  ALBUMIN 3.2*   No results for input(s): LIPASE, AMYLASE in the last 168 hours. No results for input(s): AMMONIA in the last 168 hours.  CBC:  Recent Labs Lab 07/15/15 2307 07/16/15 0413 07/18/15 0530 07/19/15 0355  WBC 15.8* 7.4 5.0  --    NEUTROABS 8.4*  --   --   --   HGB 9.2* 7.5* 6.5* 9.6*  HCT 27.4* 21.9* 19.3*  --   MCV 91.9 89.7 91.2  --   PLT 321 128* 105*  --     Cardiac Enzymes:  Recent Labs Lab 07/15/15 2307 07/19/15 1648  CKTOTAL  --  24*  TROPONINI <0.03  --     BNP: Invalid input(s): POCBNP  CBG:  Recent Labs Lab 07/16/15 0228 07/17/15 0915  GLUCAP 133* 158*    Microbiology: Results for orders placed or performed during the hospital encounter of 07/15/15  MRSA PCR Screening     Status: None   Collection Time: 07/16/15  2:42 AM  Result Value Ref Range Status   MRSA by PCR NEGATIVE NEGATIVE Final    Comment:        The GeneXpert MRSA Assay (FDA approved for NASAL specimens only), is one component of a comprehensive MRSA colonization surveillance program. It is not intended to diagnose MRSA infection nor to guide or monitor treatment for MRSA infections.   Wound culture     Status: None (Preliminary result)   Collection Time: 07/19/15  4:00 PM  Result Value Ref Range Status   Specimen Description Groin, Right  Final   Special Requests  Normal  Final   Gram Stain PENDING  Incomplete   Culture HEAVY GROWTH ENTEROBACTER CLOACAE  Final   Report Status PENDING  Incomplete   Organism ID, Bacteria ENTEROBACTER CLOACAE  Final      Susceptibility   Enterobacter cloacae - MIC*    PIP/TAZO Value in next row Resistant      RESISTANT>=128    CEFTAZIDIME Value in next row Resistant      RESISTANT>=64    CEFTRIAXONE Value in next row Resistant      RESISTANT>=64    CEFEPIME Value in next row Resistant      RESISTANT>=64    IMIPENEM Value in next row Sensitive      SENSITIVE0.5    GENTAMICIN Value in next row Sensitive      SENSITIVE<=1    CIPROFLOXACIN Value in next row Sensitive      SENSITIVE<=0.25    TRIMETH/SULFA Value in next row Sensitive      SENSITIVE<=20    * HEAVY GROWTH ENTEROBACTER CLOACAE    Coagulation Studies: No results for input(s): LABPROT, INR in the last 72  hours.  Urinalysis: No results for input(s): COLORURINE, LABSPEC, PHURINE, GLUCOSEU, HGBUR, BILIRUBINUR, KETONESUR, PROTEINUR, UROBILINOGEN, NITRITE, LEUKOCYTESUR in the last 72 hours.  Invalid input(s): APPERANCEUR    Imaging: No results found.   Medications:     . sodium chloride   Intravenous Once  . amLODipine  5 mg Oral Daily  . antiseptic oral rinse  7 mL Mouth Rinse BID  . ciprofloxacin  500 mg Oral QHS  . DAPTOmycin (CUBICIN)  IV  500 mg Intravenous Q48H  . docusate sodium  100 mg Oral BID  . famotidine  20 mg Oral Daily  . fluticasone  2 spray Each Nare Daily  . hydrALAZINE  25 mg Oral 3 times per day  . levalbuterol  0.63 mg Nebulization TID  . levothyroxine  88 mcg Oral QAC breakfast  . metoprolol tartrate  25 mg Oral 3 times per day  . multivitamin  1 tablet Oral Daily  . pantoprazole  40 mg Oral BID AC  . sodium chloride  1 spray Each Nare 5 X Daily  . sodium chloride flush  3 mL Intravenous Q12H  . sodium chloride flush  3 mL Intravenous Q12H   sodium chloride, acetaminophen, albuterol, ALPRAZolam, anticoagulant sodium citrate, chlorpheniramine-HYDROcodone, HYDROcodone-acetaminophen, magnesium hydroxide, ondansetron (ZOFRAN) IV, sodium chloride flush  Assessment/ Plan:  64 y.o. female with COPD, hypertension, hyperlipidemia, vaginal intraepithelial neoplasia, emergent AAA repair status post rupture on November 18, left to right femoral to femoral bypass , acute renal failure leading to permanent dialysis, significant 3 vessel disease, HIT positive, thrombocytopenia.  [facility] [place]  1.  ESRD on HD:  Monitor daily for dialysis. Continue to have significant pulmonary edema. Has received five consecutive days of hemodialysis with total ultrafiltration of >9 litres.  - evaluate daily for dialysis. Next treatment scheduled for Tuesday.   2.  Anemia of CKD: with duodenal AVM.  status post 2 units PRBC on 4/20 - epo with treatment TTS  3.   Secondary Hyperparathyroidism: not currently on binders. PTH low at 21 on 06/18/15  4. Hypertension with diastolic congestive heart failure: acute exacerbation.  - amlodipine, hydralazine and metoprolol  5. Infected seroma: now leaking again. Restarted on CIPRO and dapto. Will need to know the end date for these antibiotics as per ID and vascular.    LOS: 6 Ronelle Michie 4/24/20174:43 PM

## 2015-07-22 NOTE — Progress Notes (Signed)
[facility] [place] NAME: [name]    MRN#:  [id number]  DATE OF BIRTH:  [birth year]  SUBJECTIVE:  Hospital Day: 6 days [name] is a 65 y.o. female presenting with Shortness of Breath .   Overnight events: No overnight events Interval Events: No real complaints still has drainage from right femoral seroma  REVIEW OF SYSTEMS:  CONSTITUTIONAL: No fever,Positive fatigue or weakness.  EYES: No blurred or double vision.  EARS, NOSE, AND THROAT: No tinnitus or ear pain.  RESPIRATORY: No cough, shortness of breath, wheezing or hemoptysis.  CARDIOVASCULAR: No chest pain, orthopnea, positive edema.  GASTROINTESTINAL: No nausea, vomiting, diarrhea or abdominal pain.  GENITOURINARY: No dysuria, hematuria.  ENDOCRINE: No polyuria, nocturia,  HEMATOLOGY: No anemia, easy bruising or bleeding SKIN: No rash or lesion. MUSCULOSKELETAL: No joint pain or arthritis.   NEUROLOGIC: No tingling, numbness, weakness.  PSYCHIATRY: No anxiety or depression.   DRUG ALLERGIES:   Allergies  Allergen Reactions  . Asa [Aspirin] Other (See Comments)    unknown  . Plavix [Clopidogrel] Other (See Comments)    unknown    VITALS:  Blood pressure 145/64, pulse 75, temperature 98.1 F (36.7 C), temperature source Oral, resp. rate 17, height 5' 4.5" (1.638 m), weight 54.25 kg (119 lb 9.6 oz), last menstrual period 03/30/1988, SpO2 99 %.  PHYSICAL EXAMINATION:  VITAL SIGNS: Filed Vitals:   07/22/15 0526 07/22/15 1336  BP: 142/62 145/64  Pulse: 69 75  Temp: 98 F (36.7 C) 98.1 F (36.7 C)  Resp: 31 17   GENERAL:64 y.o.female currently in no acute distress.  HEAD: Normocephalic, atraumatic.  EYES: Pupils equal, round, reactive to light. Extraocular muscles intact. No scleral icterus.  MOUTH: Moist mucosal membrane. Dentition intact. No abscess noted.  EAR, NOSE, THROAT: Clear without exudates. No external lesions.  NECK: Supple. No  thyromegaly. No nodules. No JVD.  PULMONARY: Clear to ascultation, without wheeze rails or rhonci. No use of accessory muscles, Good respiratory effort. good air entry bilaterally CHEST: Nontender to palpation.  CARDIOVASCULAR: S1 and S2. Regular rate and rhythm. No murmurs, rubs, or gallops. No edema. Pedal pulses 2+ bilaterally.  GASTROINTESTINAL: Soft, nontender, nondistended. No masses. Positive bowel sounds. No hepatosplenomegaly.  MUSCULOSKELETAL: No swelling, clubbing, or edema. Range of motion full in all extremities.  NEUROLOGIC: Cranial nerves II through XII are intact. No gross focal neurological deficits. Sensation intact. Reflexes intact.  SKIN: Right groin seroma fair amount of serous drainage, minimal erythema surrounding otherwise No ulceration, lesions, rashes, or cyanosis. Skin warm and dry. Turgor intact.  PSYCHIATRIC: Mood, affect within normal limits. The patient is awake, alert and oriented x 3. Insight, judgment intact.      LABORATORY PANEL:   CBC  Recent Labs Lab 07/18/15 0530 07/19/15 0355  WBC 5.0  --   HGB 6.5* 9.6*  HCT 19.3*  --   PLT 105*  --    ------------------------------------------------------------------------------------------------------------------  Chemistries   Recent Labs Lab 07/15/15 2307  07/19/15 0355  NA 133*  < > 142  K 3.6  < > 3.1*  CL 97*  < > 102  CO2 27  < > 32  GLUCOSE 187*  < > 86  BUN 27*  < > 12  CREATININE 4.69*  < > 2.20*  CALCIUM 8.7*  < > 8.8*  AST 48*  --   --   ALT 23  --   --   ALKPHOS 118  --   --  BILITOT 0.6  --   --   < > = values in this interval not displayed. ------------------------------------------------------------------------------------------------------------------  Cardiac Enzymes  Recent Labs Lab 07/15/15 2307  TROPONINI <0.03   ------------------------------------------------------------------------------------------------------------------  RADIOLOGY:  No results  found.  EKG:   Orders placed or performed during the hospital encounter of 07/15/15  . EKG 12-Lead  . EKG 12-Lead    ASSESSMENT AND PLAN:   [name] is a 65 y.o. female presenting with Shortness of Breath . Admitted 07/15/2015 : Day #: 6 days 1. Acute on chronic respiratory failure with hypoxia. Improved Patient on her usual nasal cannula 2. Acute diastolic congestive heart failure, nephrology input appreciated improved 3. Anemia of chronic disease.  improved 4. Infected seroma: wound vac placed, antibiotics changed to cipro last evening - will discuss with ID 5. Essential hypertension. Continue usual medications 6. Hyperlipidemia unspecified continue atorvastatin and famotidine 7. Hypothyroidism unspecified continue levothyroxine 8. End-stage renal disease. Per nephrology 9. History of COPD on chronic oxygen   All the records are reviewed and case discussed with Care Management/Social Workerr. Management plans discussed with the patient, family and they are in agreement.  CODE STATUS: full TOTAL TIME TAKING CARE OF THIS PATIENT: 28 minutes.   POSSIBLE D/C IN 1DAYS, DEPENDING ON CLINICAL CONDITION.   Rishav Rockefeller,  Karenann Cai.D on 07/22/2015 at 3:09 PM  Between 7am to 6pm - Pager - [phone]  After 6pm: House Pager: - [facility]  Office  [phone]  CC: Primary care physician; Arnette Norris, MD

## 2015-07-22 NOTE — Care Management Important Message (Signed)
Important Message  Patient Details  Name: [name] MRN: [id number] Date of Birth: 05-Dec-1950   Medicare Important Message Given:  Yes    Beverly Sessions, RN 07/22/2015, 10:18 AM

## 2015-07-22 NOTE — Progress Notes (Signed)
Physical Therapy Treatment Patient Details Name: [name] MRN: [id number] DOB: 1950/08/09 Today's Date: 07/22/2015    History of Present Illness 65 y.o. female with a known history of End-stage renal disease on dialysis, hypertension, hyperlipidemia, GERD, abdominal aortic aneurysm, diastolic heart failure presented to the emergency room for difficulty breathing.  She apparently had severe LE swelling and has had a lot of fluid taken off in 5 days of dialysis.  She had an aneurysm ~5 months ago and has been working on getting back to transfers/standing etc at rehab.    PT Comments    Pt was able to improve her functional mobility by performing a sliding board transfer today from bed to recliner with min A. Assistance for set up and placement of board with cues for safe technique provided. Attempted to stand with +1 assist was unsuccessful and will need +2 for future attempt. Pt had no c/o pain during session and tolerated well with no SOB. RN educated on sliding board technique and set up. Plan to continue standing attempts and strengthening to increase functional mobility.  Follow Up Recommendations  SNF     Equipment Recommendations       Recommendations for Other Services       Precautions / Restrictions Precautions Precautions: Fall Restrictions Weight Bearing Restrictions: No    Mobility  Bed Mobility Overal bed mobility: Needs Assistance Bed Mobility: Supine to Sit     Supine to sit: Min guard;HOB elevated     General bed mobility comments: Pt shows great effort with bed mobility and is able to use UEs to assist LE movement  Transfers Overall transfer level: Needs assistance Equipment used: Rolling walker (2 wheeled) (sliding board) Transfers: Lateral/Scoot Transfers;Sit to/from Stand Sit to Stand:  (attempted and pt was unable with 1+ assistance)        Lateral/Scoot Transfers: Min assist (using sliding board, assistance for placing/removing  board) General transfer comment: Pt was unable to stand with +1 assistance  Ambulation/Gait             General Gait Details: unable at this time   Stairs            Wheelchair Mobility    Modified Rankin (Stroke Patients Only)       Balance Overall balance assessment: Needs assistance Sitting-balance support: Bilateral upper extremity supported Sitting balance-Leahy Scale: Good Sitting balance - Comments: Pt able to sit up at EOB w/o issue, good balance       Standing balance comment: NT today                    Cognition Arousal/Alertness: Awake/alert Behavior During Therapy: WFL for tasks assessed/performed Overall Cognitive Status: Within Functional Limits for tasks assessed                      Exercises Other Exercises Other Exercises: B LE therex: supine: heel sides with AAROM, seated: LAQs, marching and ankle pumps with AAROM x15 each. Cues for proper technique. Other Exercises: Sliding board transfer to/from bed/chair from high to low with min A. Primary assistance for placing and removing board. RN educated on set up for return transfer with sliding board and chair to bed set up.    General Comments        Pertinent Vitals/Pain Pain Assessment: No/denies pain    Home Living  Prior Function            PT Goals (current goals can now be found in the care plan section) Acute Rehab PT Goals Patient Stated Goal: I just want to be able to transfer myself and do a little bit of walking PT Goal Formulation: With patient Time For Goal Achievement: 08/03/15 Potential to Achieve Goals: Fair Progress towards PT goals: Progressing toward goals    Frequency  Min 2X/week    PT Plan Current plan remains appropriate    Co-evaluation             End of Session Equipment Utilized During Treatment: Gait belt Activity Tolerance: Patient tolerated treatment well Patient left: in chair;with call  bell/phone within reach;with chair alarm set;with family/visitor present     Time: 1625-1650 PT Time Calculation (min) (ACUTE ONLY): 25 min  Charges:  $Therapeutic Exercise: 8-22 mins $Therapeutic Activity: 8-22 mins                    G Codes:      Neoma Laming, PT, DPT  07/22/2015, 5:09 PM [phone]

## 2015-07-22 NOTE — Progress Notes (Signed)
[facility] INFECTIOUS DISEASE PROGRESS NOTE Date of Admission:  07/15/2015     ID: [name] is a 65 y.o. female with infected R groin seroma  Principal Problem:   Fluid overload   Subjective: Culture growing enterobacter, changed to cipro. No fevers.   ROS  Eleven systems are reviewed and negative except per hpi  Medications:  Antibiotics Given (last 72 hours)    Date/Time Action Medication Dose Rate   07/19/15 1630 Given   cefTAZidime (FORTAZ) 1 g in dextrose 5 % 50 mL IVPB 1 g 100 mL/hr   07/19/15 2004 Given   DAPTOmycin (CUBICIN) 220 mg in sodium chloride 0.9 % IVPB 220 mg 208.8 mL/hr   07/20/15 1709 Given   cefTAZidime (FORTAZ) 2 g in dextrose 5 % 50 mL IVPB 2 g 100 mL/hr   07/21/15 2000 Given   DAPTOmycin (CUBICIN) 500 mg in sodium chloride 0.9 % IVPB 500 mg 220 mL/hr   07/21/15 2105 Given   ciprofloxacin (CIPRO) tablet 500 mg 500 mg      . sodium chloride   Intravenous Once  . amLODipine  5 mg Oral Daily  . antiseptic oral rinse  7 mL Mouth Rinse BID  . ciprofloxacin  500 mg Oral QHS  . DAPTOmycin (CUBICIN)  IV  500 mg Intravenous Q48H  . docusate sodium  100 mg Oral BID  . famotidine  20 mg Oral Daily  . fluticasone  2 spray Each Nare Daily  . hydrALAZINE  25 mg Oral 3 times per day  . levalbuterol  0.63 mg Nebulization TID  . levothyroxine  88 mcg Oral QAC breakfast  . metoprolol tartrate  25 mg Oral 3 times per day  . multivitamin  1 tablet Oral Daily  . pantoprazole  40 mg Oral BID AC  . sodium chloride  1 spray Each Nare 5 X Daily  . sodium chloride flush  3 mL Intravenous Q12H  . sodium chloride flush  3 mL Intravenous Q12H    Objective: Vital signs in last 24 hours: Temp:  [98 F (36.7 C)-98.7 F (37.1 C)] 98.1 F (36.7 C) (04/24 1336) Pulse Rate:  [69-79] 75 (04/24 1336) Resp:  [16-20] 17 (04/24 1336) BP: (133-149)/(61-67) 145/64 mmHg (04/24 1336) SpO2:  [96 %-99 %] 99 % (04/24 1344) Weight:  [54.25 kg (119 lb 9.6 oz)] 54.25 kg  (119 lb 9.6 oz) (04/24 0526) Constitutional: Thin, chronically ill appearing HENT: [place]/AT, PERRLA, no scleral icterus Mouth/Throat: Oropharynx is clear and moist. No oropharyngeal exudate.  Cardiovascular: Normal rate,  R chest wall HD cath wnl Pulmonary/Chest: bil rhonchi Neck = supple, no nuchal rigidity Abdominal: Soft. Bowel sounds are normal. exhibits no distension. There is no tenderness.  R groin small wound covered with wound vac Lymphadenopathy: no cervical adenopathy. No axillary adenopathy Neurological: alert and oriented to person, place, and time.  Skin: Skin is warm and dry. \ Psychiatric: a normal mood and affect. behavior is normal.   Lab Results No results for input(s): WBC, HGB, HCT, NA, K, CL, CO2, BUN, CREATININE, GLU in the last 72 hours.  Invalid input(s): PLATELETS  Microbiology: Results for orders placed or performed during the hospital encounter of 07/15/15  MRSA PCR Screening     Status: None   Collection Time: 07/16/15  2:42 AM  Result Value Ref Range Status   MRSA by PCR NEGATIVE NEGATIVE Final    Comment:        The GeneXpert MRSA Assay (FDA approved for NASAL specimens only),  is one component of a comprehensive MRSA colonization surveillance program. It is not intended to diagnose MRSA infection nor to guide or monitor treatment for MRSA infections.   Wound culture     Status: None (Preliminary result)   Collection Time: 07/19/15  4:00 PM  Result Value Ref Range Status   Specimen Description Groin, Right  Final   Special Requests Normal  Final   Gram Stain PENDING  Incomplete   Culture HEAVY GROWTH ENTEROBACTER CLOACAE  Final   Report Status PENDING  Incomplete   Organism ID, Bacteria ENTEROBACTER CLOACAE  Final      Susceptibility   Enterobacter cloacae - MIC*    PIP/TAZO Value in next row Resistant      RESISTANT>=128    CEFTAZIDIME Value in next row Resistant      RESISTANT>=64    CEFTRIAXONE Value in next row Resistant       RESISTANT>=64    CEFEPIME Value in next row Resistant      RESISTANT>=64    IMIPENEM Value in next row Sensitive      SENSITIVE0.5    GENTAMICIN Value in next row Sensitive      SENSITIVE<=1    CIPROFLOXACIN Value in next row Sensitive      SENSITIVE<=0.25    TRIMETH/SULFA Value in next row Sensitive      SENSITIVE<=20    * HEAVY GROWTH ENTEROBACTER CLOACAE     Studies/Results: No results found.  Assessment/Plan: [name] is a 65 y.o. female with a complicated vascular history who I have been following for an infected seroma in her R groin, with past cxs growing pseudomonas and VRE. She has been on a prolonged course of daptomycin and ceftazidime at HD and the wound has been improving. However she is now admitted with vol overload. Of note her wound vac was removed a week ago at her facility because her husband says they could not get it to seal as the wound has decreased in size. However she is now having increasing drainage of thin serous fluid. She does not have tenderness or redness at the site. She is unsure if she has been getting the abx at HD as prescribed but should per my records still be on it with a stop date of this week.   4/24 - wound stable. Seen by vascular. cx with enterobacter  Recommendations Continue ciprofloxacin 500 qd for at least 4 weeks- this will cover the enterobacter and the pseudomonas. No good options for the prior VRE orally but may have cleared that pathogen Uncertain if the grafts are infected.  I can see in 3 weeks times to help decide if and when to stop abx Thank you very much for the consult. Will follow with you.  Charlotte Brafford P   07/22/2015, 1:47 PM

## 2015-07-22 NOTE — Consult Note (Signed)
WOC wound consult note Reason for Consult: Nonhealing surgical wound to right inguinal fold.  Wound type:Nonhealing surgical wound Pressure Ulcer POA: Yes Measurement: 3 cm x 0.2 cm x 0.2 cm  Wound [id number] moist and hypergranulated in wound bed. Some maceration to periwound Drainage (amount, consistency, odor) Minimal serosanguinous drainage.  No odor.  Periwound:Maceration Dressing procedure/placement/frequency:Cleanse wound with NS and pat gently dry.  Apply granufoam to wound bed.  Cover with drape.  Change Mon/Wed/Fri Will not follow at this time.  Please re-consult if needed.  Domenic Moras RN BSN [place] Pager [phone]

## 2015-07-22 NOTE — Clinical Social Work Note (Signed)
Staff had made mention that patient may want to go to another SNF facility. CSW met with patient this morning and she stated she was considering another facility but stated that she will return to [facility] at discharge. She is thinking about looking into [facility] but stated she knows her medicare is going to run out in a couple of weeks and then she would be private pay. Patient states that she is not currently medicaid eligible and is aware of the process because her mother went through the same process at [facility]. Patient states that she has a lot of money in savings and would be able to cover a month's SNF fees easily. CSW offered to send her information to [facility] but patient declined at this time and stated she will return to [facility] at discharge. Shela Leff MSW,LCSW [phone]
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
## 2015-07-23 DIAGNOSIS — N186 End stage renal disease: Secondary | ICD-10-CM | POA: Diagnosis not present

## 2015-07-23 DIAGNOSIS — H8110 Benign paroxysmal vertigo, unspecified ear: Secondary | ICD-10-CM | POA: Diagnosis not present

## 2015-07-23 DIAGNOSIS — D631 Anemia in chronic kidney disease: Secondary | ICD-10-CM | POA: Diagnosis not present

## 2015-07-23 DIAGNOSIS — J9601 Acute respiratory failure with hypoxia: Secondary | ICD-10-CM | POA: Diagnosis not present

## 2015-07-23 DIAGNOSIS — R41841 Cognitive communication deficit: Secondary | ICD-10-CM | POA: Diagnosis not present

## 2015-07-23 DIAGNOSIS — R7881 Bacteremia: Secondary | ICD-10-CM | POA: Diagnosis not present

## 2015-07-23 DIAGNOSIS — J449 Chronic obstructive pulmonary disease, unspecified: Secondary | ICD-10-CM | POA: Diagnosis not present

## 2015-07-23 DIAGNOSIS — J9602 Acute respiratory failure with hypercapnia: Secondary | ICD-10-CM | POA: Diagnosis not present

## 2015-07-23 DIAGNOSIS — D509 Iron deficiency anemia, unspecified: Secondary | ICD-10-CM | POA: Diagnosis not present

## 2015-07-23 DIAGNOSIS — R262 Difficulty in walking, not elsewhere classified: Secondary | ICD-10-CM | POA: Diagnosis not present

## 2015-07-23 DIAGNOSIS — E039 Hypothyroidism, unspecified: Secondary | ICD-10-CM | POA: Diagnosis not present

## 2015-07-23 DIAGNOSIS — E8770 Fluid overload, unspecified: Secondary | ICD-10-CM | POA: Diagnosis not present

## 2015-07-23 DIAGNOSIS — I132 Hypertensive heart and chronic kidney disease with heart failure and with stage 5 chronic kidney disease, or end stage renal disease: Secondary | ICD-10-CM | POA: Diagnosis not present

## 2015-07-23 DIAGNOSIS — R531 Weakness: Secondary | ICD-10-CM | POA: Diagnosis not present

## 2015-07-23 DIAGNOSIS — I5030 Unspecified diastolic (congestive) heart failure: Secondary | ICD-10-CM | POA: Diagnosis not present

## 2015-07-23 DIAGNOSIS — I214 Non-ST elevation (NSTEMI) myocardial infarction: Secondary | ICD-10-CM | POA: Diagnosis not present

## 2015-07-23 DIAGNOSIS — F419 Anxiety disorder, unspecified: Secondary | ICD-10-CM | POA: Diagnosis not present

## 2015-07-23 DIAGNOSIS — E785 Hyperlipidemia, unspecified: Secondary | ICD-10-CM | POA: Diagnosis not present

## 2015-07-23 DIAGNOSIS — E8779 Other fluid overload: Secondary | ICD-10-CM | POA: Diagnosis not present

## 2015-07-23 DIAGNOSIS — J9621 Acute and chronic respiratory failure with hypoxia: Secondary | ICD-10-CM | POA: Diagnosis not present

## 2015-07-23 DIAGNOSIS — D649 Anemia, unspecified: Secondary | ICD-10-CM | POA: Diagnosis not present

## 2015-07-23 DIAGNOSIS — M6281 Muscle weakness (generalized): Secondary | ICD-10-CM | POA: Diagnosis not present

## 2015-07-23 DIAGNOSIS — K219 Gastro-esophageal reflux disease without esophagitis: Secondary | ICD-10-CM | POA: Diagnosis not present

## 2015-07-23 DIAGNOSIS — I10 Essential (primary) hypertension: Secondary | ICD-10-CM | POA: Diagnosis not present

## 2015-07-23 DIAGNOSIS — J441 Chronic obstructive pulmonary disease with (acute) exacerbation: Secondary | ICD-10-CM | POA: Diagnosis not present

## 2015-07-23 DIAGNOSIS — E7800 Pure hypercholesterolemia, unspecified: Secondary | ICD-10-CM | POA: Diagnosis not present

## 2015-07-23 DIAGNOSIS — R1310 Dysphagia, unspecified: Secondary | ICD-10-CM | POA: Diagnosis not present

## 2015-07-23 DIAGNOSIS — R0602 Shortness of breath: Secondary | ICD-10-CM | POA: Diagnosis not present

## 2015-07-23 DIAGNOSIS — I5033 Acute on chronic diastolic (congestive) heart failure: Secondary | ICD-10-CM | POA: Diagnosis not present

## 2015-07-23 DIAGNOSIS — L7634 Postprocedural seroma of skin and subcutaneous tissue following other procedure: Secondary | ICD-10-CM | POA: Diagnosis not present

## 2015-07-23 DIAGNOSIS — T814XXS Infection following a procedure, sequela: Secondary | ICD-10-CM | POA: Diagnosis not present

## 2015-07-23 DIAGNOSIS — I713 Abdominal aortic aneurysm, ruptured: Secondary | ICD-10-CM | POA: Diagnosis not present

## 2015-07-23 DIAGNOSIS — J810 Acute pulmonary edema: Secondary | ICD-10-CM | POA: Diagnosis not present

## 2015-07-23 DIAGNOSIS — Z992 Dependence on renal dialysis: Secondary | ICD-10-CM | POA: Diagnosis not present

## 2015-07-23 DIAGNOSIS — T814XXD Infection following a procedure, subsequent encounter: Secondary | ICD-10-CM | POA: Diagnosis not present

## 2015-07-23 MED ORDER — ANTICOAGULANT SODIUM CITRATE 4% (200MG/5ML) IV SOLN
5.0000 mL | Status: DC | PRN
  Administered 2015-07-23: 5 mL via INTRAVENOUS
  Filled 2015-07-23 (×2): qty 250

## 2015-07-23 MED ORDER — CIPROFLOXACIN HCL 500 MG PO TABS: 500.0000 mg | ORAL_TABLET | Freq: Every day | ORAL | Status: DC

## 2015-07-23 MED ORDER — HYDROCODONE-ACETAMINOPHEN 5-325 MG PO TABS: 1.0000 | ORAL_TABLET | Freq: Four times a day (QID) | ORAL | Status: DC | PRN

## 2015-07-23 MED ORDER — ALPRAZOLAM 0.25 MG PO TABS: 0.2500 mg | ORAL_TABLET | Freq: Two times a day (BID) | ORAL | Status: DC | PRN

## 2015-07-23 NOTE — Care Management Note (Signed)
Patient is active at [facility] on TTS schedule.  I have sent admission records and will send updated redoords at discharge.   [facility] Liaison  [phone]

## 2015-07-23 NOTE — Progress Notes (Signed)
07/23/2015 15:15  [name] to be D/C'd Skilled nursing facility per MD order.  Gave report to receiving nurse at [facility].    Medication List    STOP taking these medications        cefTAZidime 2 g in dextrose 5 % 50 mL     DAPTOmycin 500 mg in sodium chloride 0.9 % 100 mL      TAKE these medications        acetaminophen 325 MG tablet  Commonly known as:  TYLENOL  Take 2 tablets (650 mg total) by mouth every 6 (six) hours as needed for mild pain (or Fever >/= 101).     albuterol (2.5 MG/3ML) 0.083% nebulizer solution  Commonly known as:  PROVENTIL  Take 2.5 mg by nebulization every 6 (six) hours as needed for wheezing.     ALPRAZolam 0.25 MG tablet  Commonly known as:  XANAX  Take 1 tablet (0.25 mg total) by mouth 2 (two) times daily as needed for anxiety.     ALPRAZolam 0.25 MG tablet  Commonly known as:  XANAX  Take 1 tablet (0.25 mg total) by mouth 2 (two) times daily as needed for anxiety.     alum & mag hydroxide-simeth 200-200-20 MG/5ML suspension  Commonly known as:  MAALOX/MYLANTA  Take 30 mLs by mouth every 6 (six) hours as needed for indigestion or heartburn (dyspepsia).     amLODipine 5 MG tablet  Commonly known as:  NORVASC  Take 1 tablet (5 mg total) by mouth daily.     atorvastatin 40 MG tablet  Commonly known as:  LIPITOR  Take 40 mg by mouth daily.     budesonide-formoterol 160-4.5 MCG/ACT inhaler  Commonly known as:  SYMBICORT  Inhale 2 puffs into the lungs 2 (two) times daily.     chlorpheniramine-HYDROcodone 10-8 MG/5ML Suer  Commonly known as:  TUSSIONEX  Take 5 mLs by mouth every 12 (twelve) hours as needed for cough.     ciprofloxacin 500 MG tablet  Commonly known as:  CIPRO  Take 1 tablet (500 mg total) by mouth at bedtime.     Docusate Sodium 100 MG capsule  Take 100 mg by mouth 2 (two) times daily.     famotidine 20 MG tablet  Commonly known as:  PEPCID  Take 20 mg by mouth daily.     fluticasone 50 MCG/ACT  nasal spray  Commonly known as:  FLONASE  Place 2 sprays into both nostrils daily.     guaiFENesin-dextromethorphan 100-10 MG/5ML syrup  Commonly known as:  ROBITUSSIN DM  Take 10 mLs by mouth every 4 (four) hours as needed for cough.     hydrALAZINE 25 MG tablet  Commonly known as:  APRESOLINE  Take 1 tablet (25 mg total) by mouth every 8 (eight) hours.     HYDROcodone-acetaminophen 5-325 MG tablet  Commonly known as:  NORCO/VICODIN  Take 1-2 tablets by mouth every 6 (six) hours as needed for moderate pain or severe pain.     HYDROcodone-acetaminophen 5-325 MG tablet  Commonly known as:  NORCO/VICODIN  Take 1-2 tablets by mouth every 6 (six) hours as needed for moderate pain or severe pain.     levothyroxine 88 MCG tablet  Commonly known as:  SYNTHROID, LEVOTHROID  Take 88 mcg by mouth daily.     loperamide 2 MG tablet  Commonly known as:  IMODIUM A-D  Take 2 mg by mouth every 4 (four) hours as needed for diarrhea or loose stools.  magnesium hydroxide 400 MG/5ML suspension  Commonly known as:  MILK OF MAGNESIA  Take 30 mLs by mouth daily as needed for mild constipation.     metoprolol tartrate 25 MG tablet  Commonly known as:  LOPRESSOR  Take 1 tablet (25 mg total) by mouth every 8 (eight) hours.     multivitamin Tabs tablet  Take 1 tablet by mouth daily.     ondansetron 4 MG tablet  Commonly known as:  ZOFRAN  Take 4 mg by mouth every 6 (six) hours as needed for nausea or vomiting.     pantoprazole 40 MG tablet  Commonly known as:  PROTONIX  Take 1 tablet (40 mg total) by mouth 2 (two) times daily before a meal.     sodium chloride 0.65 % Soln nasal spray  Commonly known as:  OCEAN  Place 1 spray into both nostrils 5 (five) times daily.        Filed Vitals:   07/23/15 1330 07/23/15 1427  BP: 156/68 155/67  Pulse: 72 75  Temp: 98.1 F (36.7 C) 97.8 F (36.6 C)  Resp: 12 16    Skin clean, dry and intact without evidence of skin break down, no  evidence of skin tears noted. IV catheter discontinued intact. Site without signs and symptoms of complications. Dressing and pressure applied. Pt denies pain at this time. No complaints noted.  An After Visit Summary was printed and given to the patient. Pt D/C to SNF via EMS  Dola Argyle

## 2015-07-23 NOTE — Discharge Summary (Signed)
[facility] [place] NAME: [name]    MR#:  [id number]  DATE OF BIRTH:  [birth year]  DATE OF ADMISSION:  07/15/2015 ADMITTING PHYSICIAN: Saundra Shelling, MD  DATE OF DISCHARGE: 07/23/2015  PRIMARY CARE PHYSICIAN: Arnette Norris, MD    ADMISSION DIAGNOSIS:  End stage renal disease ([place]) [N18.6] Acute pulmonary edema (HCC) [J81.0] Acute respiratory failure with hypoxia and hypercapnia (HCC) [J96.01, J96.02] Other hypervolemia [E87.79]  DISCHARGE DIAGNOSIS:  1. Acute on chronic respiratory failure with hypoxia. Improved  2. Acute diastolic congestive heart failureimproved 3. Anemia of chronic disease. 4. Infected seroma: 5. Essential hypertension.  6. Hyperlipidemia unspecified  7. Hypothyroidism unspecified 8. End-stage renal disease. On hemodialysis 9. COPD on chronic oxygen  SECONDARY DIAGNOSIS:   Past Medical History  Diagnosis Date  . Hyperlipidemia   . Hypertension   . Thyroid disease   . GERD (gastroesophageal reflux disease)   . Aneurysm ([place])     abdominal aortic  . STD (sexually transmitted disease)     chlamydia  . VAIN II (vaginal intraepithelial neoplasia grade II) 7/09  . 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 B12 deficiency   . COPD (chronic obstructive pulmonary disease) ([place])   . Renal insufficiency   . Cancer ([place])     skin on left ankle 04/10/15 pt states she has never had cancer  . ESRD (end stage renal disease) ([place])   . Diastolic heart failure [facility])     HOSPITAL COURSE:  [name]  is a 65 y.o. female admitted 07/15/2015 with chief complaint Shortness of Breath . Please see H&P performed by Saundra Shelling, MD for further information. Patient presented with above symptoms. Found to be in volume overload requiring hemodialysis. She had multiple episodes of daily dialysis with improvement of breathing back to baseline. During her stay her infected right groin seroma was also  evaluated - wound vac placed, antibiotics changed. Infectious disease and vascular surgery were involved in this care.  DISCHARGE CONDITIONS:   stable  CONSULTS OBTAINED:  Treatment Team:  Lavonia Dana, MD Yolonda Kida, MD Leonel Ramsay, MD Katha Cabal, MD  DRUG ALLERGIES:   Allergies  Allergen Reactions  . Asa [Aspirin] Other (See Comments)    unknown  . Plavix [Clopidogrel] Other (See Comments)    unknown    DISCHARGE MEDICATIONS:   Current Discharge Medication List    START taking these medications   Details  ciprofloxacin (CIPRO) 500 MG tablet Take 1 tablet (500 mg total) by mouth at bedtime. Qty: 30 tablet, Refills: 0      CONTINUE these medications which have CHANGED   Details  !! ALPRAZolam (XANAX) 0.25 MG tablet Take 1 tablet (0.25 mg total) by mouth 2 (two) times daily as needed for anxiety. Qty: 30 tablet, Refills: 0    !! HYDROcodone-acetaminophen (NORCO/VICODIN) 5-325 MG tablet Take 1-2 tablets by mouth every 6 (six) hours as needed for moderate pain or severe pain. Qty: 30 tablet, Refills: 0     !! - Potential duplicate medications found. Please discuss with provider.    CONTINUE these medications which have NOT CHANGED   Details  acetaminophen (TYLENOL) 325 MG tablet Take 2 tablets (650 mg total) by mouth every 6 (six) hours as needed for mild pain (or Fever >/= 101). Qty: 30 tablet, Refills: 0    albuterol (PROVENTIL) (2.5 MG/3ML) 0.083% nebulizer solution Take 2.5 mg by nebulization every 6 (six) hours as  needed for wheezing.    !! ALPRAZolam (XANAX) 0.25 MG tablet Take 1 tablet (0.25 mg total) by mouth 2 (two) times daily as needed for anxiety. Qty: 20 tablet, Refills: 0    alum & mag hydroxide-simeth (MAALOX/MYLANTA) [id number] MG/5ML suspension Take 30 mLs by mouth every 6 (six) hours as needed for indigestion or heartburn (dyspepsia). Qty: 355 mL, Refills: 0    amLODipine (NORVASC) 5 MG tablet Take 1 tablet (5 mg total) by  mouth daily. Qty: 30 tablet, Refills: 6    atorvastatin (LIPITOR) 40 MG tablet Take 40 mg by mouth daily.    budesonide-formoterol (SYMBICORT) 160-4.5 MCG/ACT inhaler Inhale 2 puffs into the lungs 2 (two) times daily.    chlorpheniramine-HYDROcodone (TUSSIONEX) 10-8 MG/5ML SUER Take 5 mLs by mouth every 12 (twelve) hours as needed for cough. Qty: 140 mL, Refills: 0    Docusate Sodium 100 MG capsule Take 100 mg by mouth 2 (two) times daily.    famotidine (PEPCID) 20 MG tablet Take 20 mg by mouth daily.    fluticasone (FLONASE) 50 MCG/ACT nasal spray Place 2 sprays into both nostrils daily.    guaiFENesin-dextromethorphan (ROBITUSSIN DM) 100-10 MG/5ML syrup Take 10 mLs by mouth every 4 (four) hours as needed for cough. Qty: 118 mL, Refills: 0    hydrALAZINE (APRESOLINE) 25 MG tablet Take 1 tablet (25 mg total) by mouth every 8 (eight) hours. Qty: 90 tablet, Refills: 0    !! HYDROcodone-acetaminophen (NORCO/VICODIN) 5-325 MG tablet Take 1-2 tablets by mouth every 6 (six) hours as needed for moderate pain or severe pain. Qty: 20 tablet, Refills: 0    levothyroxine (SYNTHROID, LEVOTHROID) 88 MCG tablet Take 88 mcg by mouth daily.    loperamide (IMODIUM A-D) 2 MG tablet Take 2 mg by mouth every 4 (four) hours as needed for diarrhea or loose stools.    magnesium hydroxide (MILK OF MAGNESIA) 400 MG/5ML suspension Take 30 mLs by mouth daily as needed for mild constipation.    metoprolol tartrate (LOPRESSOR) 25 MG tablet Take 1 tablet (25 mg total) by mouth every 8 (eight) hours. Qty: 90 tablet, Refills: 0    multivitamin (RENA-VIT) TABS tablet Take 1 tablet by mouth daily.    ondansetron (ZOFRAN) 4 MG tablet Take 4 mg by mouth every 6 (six) hours as needed for nausea or vomiting.    pantoprazole (PROTONIX) 40 MG tablet Take 1 tablet (40 mg total) by mouth 2 (two) times daily before a meal. Qty: 60 tablet, Refills: 2    sodium chloride (OCEAN) 0.65 % SOLN nasal spray Place 1 spray into  both nostrils 5 (five) times daily.     !! - Potential duplicate medications found. Please discuss with provider.    STOP taking these medications     cefTAZidime 2 g in dextrose 5 % 50 mL      DAPTOmycin 500 mg in sodium chloride 0.9 % 100 mL          DISCHARGE INSTRUCTIONS:    DIET:  Renal diet  DISCHARGE CONDITION:  Stable  ACTIVITY:  Activity as tolerated  OXYGEN:  Home Oxygen: Yes.     Oxygen Delivery: 2 liters/min via Patient connected to nasal cannula oxygen  DISCHARGE LOCATION:  nursing home   If you experience worsening of your admission symptoms, develop shortness of breath, life threatening emergency, suicidal or homicidal thoughts you must seek medical attention immediately by calling 911 or calling your MD immediately  if symptoms less severe.  You Must read complete  instructions/literature along with all the possible adverse reactions/side effects for all the Medicines you take and that have been prescribed to you. Take any new Medicines after you have completely understood and accpet all the possible adverse reactions/side effects.   Please note  You were cared for by a hospitalist during your hospital stay. If you have any questions about your discharge medications or the care you received while you were in the hospital after you are discharged, you can call the unit and asked to speak with the hospitalist on call if the hospitalist that took care of you is not available. Once you are discharged, your primary care physician will handle any further medical issues. Please note that NO REFILLS for any discharge medications will be authorized once you are discharged, as it is imperative that you return to your primary care physician (or establish a relationship with a primary care physician if you do not have one) for your aftercare needs so that they can reassess your need for medications and monitor your lab values.    On the day of Discharge:   VITAL  SIGNS:  Blood pressure 137/73, pulse 75, temperature 97.8 F (36.6 C), temperature source Oral, resp. rate 13, height 5' 4.5" (1.638 m), weight 56.5 kg (124 lb 9 oz), last menstrual period 03/30/1988, SpO2 97 %.  I/O:   Intake/Output Summary (Last 24 hours) at 07/23/15 1046 Last data filed at 07/23/15 0214  Gross per 24 hour  Intake    240 ml  Output      0 ml  Net    240 ml    PHYSICAL EXAMINATION:  GENERAL:  65 y.o.-year-old patient lying in the bed with no acute distress.  EYES: Pupils equal, round, reactive to light and accommodation. No scleral icterus. Extraocular muscles intact.  HEENT: Head atraumatic, normocephalic. Oropharynx and nasopharynx clear.  NECK:  Supple, no jugular venous distention. No thyroid enlargement, no tenderness.  LUNGS: Normal breath sounds bilaterally, no wheezing, rales,rhonchi or crepitation. No use of accessory muscles of respiration.  CARDIOVASCULAR: S1, S2 normal. No murmurs, rubs, or gallops.  ABDOMEN: Soft, non-tender, non-distended. Bowel sounds present. No organomegaly or mass.  EXTREMITIES: No pedal edema, cyanosis, or clubbing.  NEUROLOGIC: Cranial nerves II through XII are intact. Muscle strength 5/5 in all extremities. Sensation intact. Gait not checked.  PSYCHIATRIC: The patient is alert and oriented x 3.  SKIN: No obvious rash, lesion, or ulcer.   DATA REVIEW:   CBC  Recent Labs Lab 07/18/15 0530 07/19/15 0355  WBC 5.0  --   HGB 6.5* 9.6*  HCT 19.3*  --   PLT 105*  --     Chemistries   Recent Labs Lab 07/19/15 0355  NA 142  K 3.1*  CL 102  CO2 32  GLUCOSE 86  BUN 12  CREATININE 2.20*  CALCIUM 8.8*    Cardiac Enzymes No results for input(s): TROPONINI in the last 168 hours.  Microbiology Results  Results for orders placed or performed during the hospital encounter of 07/15/15  MRSA PCR Screening     Status: None   Collection Time: 07/16/15  2:42 AM  Result Value Ref Range Status   MRSA by PCR NEGATIVE  NEGATIVE Final    Comment:        The GeneXpert MRSA Assay (FDA approved for NASAL specimens only), is one component of a comprehensive MRSA colonization surveillance program. It is not intended to diagnose MRSA infection nor to guide or monitor treatment for MRSA  infections.   Wound culture     Status: None (Preliminary result)   Collection Time: 07/19/15  4:00 PM  Result Value Ref Range Status   Specimen Description Groin, Right  Final   Special Requests Normal  Final   Gram Stain PENDING  Incomplete   Culture HEAVY GROWTH ENTEROBACTER CLOACAE  Final   Report Status PENDING  Incomplete   Organism ID, Bacteria ENTEROBACTER CLOACAE  Final      Susceptibility   Enterobacter cloacae - MIC*    PIP/TAZO Value in next row Resistant      RESISTANT>=128    CEFTAZIDIME Value in next row Resistant      RESISTANT>=64    CEFTRIAXONE Value in next row Resistant      RESISTANT>=64    CEFEPIME Value in next row Resistant      RESISTANT>=64    IMIPENEM Value in next row Sensitive      SENSITIVE0.5    GENTAMICIN Value in next row Sensitive      SENSITIVE<=1    CIPROFLOXACIN Value in next row Sensitive      SENSITIVE<=0.25    TRIMETH/SULFA Value in next row Sensitive      SENSITIVE<=20    * HEAVY GROWTH ENTEROBACTER CLOACAE    RADIOLOGY:  No results found.   Management plans discussed with the patient, family and they are in agreement.  CODE STATUS:     Code Status Orders        Start     Ordered   07/16/15 0236  Full code   Continuous     07/16/15 0235    Code Status History    Date Active Date Inactive Code Status Order ID Comments User Context   06/15/2015  6:37 PM 06/24/2015  7:33 PM Full Code [id number]  Bettey Costa, MD Inpatient   05/20/2015 11:13 AM 06/01/2015  9:21 PM Full Code [id number]  Fritzi Mandes, MD Inpatient   04/09/2015 10:24 AM 04/22/2015  9:15 PM Full Code [id number]  Theodoro Grist, MD Inpatient      TOTAL TIME TAKING CARE OF THIS PATIENT: 33 minutes.     Hower,  Karenann Cai.D on 07/23/2015 at 10:46 AM  Between 7am to 6pm - Pager - [phone]  After 6pm go to [email or web address] - [employer] [facility]  Office  [phone]  CC: Primary care physician; Arnette Norris, MD

## 2015-07-23 NOTE — Progress Notes (Signed)
Post dialysis assessment 

## 2015-07-23 NOTE — Clinical Social Work Note (Signed)
Patient to discharge to return today to [facility]. [facility] is aware and discharge information sent. Patient is aware and in agreement. Patient to transport via EMS. Shela Leff MSW,LCSW [phone]

## 2015-07-23 NOTE — Progress Notes (Signed)
[facility]  ROUNDING NOTE   Subjective:   Greater than 7 liters neg for this admission Breathing is fine today  No acute c/o   Patient seen during dialysis Tolerating well    HEMODIALYSIS FLOWSHEET:  Blood Flow Rate (mL/min): 300 mL/min Arterial Pressure (mmHg): -130 mmHg Venous Pressure (mmHg): 130 mmHg Transmembrane Pressure (mmHg): 50 mmHg Ultrafiltration Rate (mL/min): 720 mL/min Dialysate Flow Rate (mL/min): 600 ml/min Conductivity: Machine : 14.2 Conductivity: Machine : 14.2 Dialysis Fluid Bolus: Normal Saline (anticoagulation) Bolus Amount (mL): 100 mL Intra-Hemodialysis Comments: UF removed 2259ml. Patient alert, watching television, denies c/o.    Objective:  Vital signs in last 24 hours:  Temp:  [97.8 F (36.6 C)-98.1 F (36.7 C)] 97.8 F (36.6 C) (04/25 1427) Pulse Rate:  [69-78] 75 (04/25 1427) Resp:  [12-20] 16 (04/25 1427) BP: (127-160)/(62-78) 155/67 mmHg (04/25 1427) SpO2:  [96 %-100 %] 100 % (04/25 1427) Weight:  [54.3 kg (119 lb 11.4 oz)-56.5 kg (124 lb 9 oz)] 54.3 kg (119 lb 11.4 oz) (04/25 1330)  Weight change:  Filed Weights   07/22/15 0526 07/23/15 0946 07/23/15 1330  Weight: 54.25 kg (119 lb 9.6 oz) 56.5 kg (124 lb 9 oz) 54.3 kg (119 lb 11.4 oz)    Intake/Output: I/O last 3 completed shifts: In: 356 [P.O.:240; I.V.:6; IV Piggyback:110] Out: 0    Intake/Output this shift:  Total I/O In: -  Out: 1900 [Other:1900]  Physical Exam: General: Chronically Ill appearing  Head: Normocephalic, atraumatic. Moist oral mucosal membranes  Eyes: Anicteric  Neck: Supple, trachea midline  Lungs:  Decreased breath sounds at bases, 2 L [place]  Heart: S1S2 no rubs  Abdomen:  Soft, nontender, BS present  Extremities:  LE edema over thigs  Neurologic: Nonfocal, moving all four extremities  Skin: No lesions  Access: R IJ PC    Basic Metabolic Panel:  Recent Labs Lab 07/18/15 0530 07/19/15 0355  NA 141 142  K 3.6 3.1*  CL 101 102   CO2 31 32  GLUCOSE 84 86  BUN 19 12  CREATININE 2.87* 2.20*  CALCIUM 8.9 8.8*    Liver Function Tests: No results for input(s): AST, ALT, ALKPHOS, BILITOT, PROT, ALBUMIN in the last 168 hours. No results for input(s): LIPASE, AMYLASE in the last 168 hours. No results for input(s): AMMONIA in the last 168 hours.  CBC:  Recent Labs Lab 07/18/15 0530 07/19/15 0355  WBC 5.0  --   HGB 6.5* 9.6*  HCT 19.3*  --   MCV 91.2  --   PLT 105*  --     Cardiac Enzymes:  Recent Labs Lab 07/19/15 1648  CKTOTAL 24*    BNP: Invalid input(s): POCBNP  CBG:  Recent Labs Lab 07/17/15 0915  GLUCAP 158*    Microbiology: Results for orders placed or performed during the hospital encounter of 07/15/15  MRSA PCR Screening     Status: None   Collection Time: 07/16/15  2:42 AM  Result Value Ref Range Status   MRSA by PCR NEGATIVE NEGATIVE Final    Comment:        The GeneXpert MRSA Assay (FDA approved for NASAL specimens only), is one component of a comprehensive MRSA colonization surveillance program. It is not intended to diagnose MRSA infection nor to guide or monitor treatment for MRSA infections.   Wound culture     Status: None (Preliminary result)   Collection Time: 07/19/15  4:00 PM  Result Value Ref Range Status   Specimen Description Groin,  Right  Final   Special Requests Normal  Final   Gram Stain PENDING  Incomplete   Culture HEAVY GROWTH ENTEROBACTER CLOACAE  Final   Report Status PENDING  Incomplete   Organism ID, Bacteria ENTEROBACTER CLOACAE  Final      Susceptibility   Enterobacter cloacae - MIC*    PIP/TAZO Value in next row Resistant      RESISTANT>=128    CEFTAZIDIME Value in next row Resistant      RESISTANT>=64    CEFTRIAXONE Value in next row Resistant      RESISTANT>=64    CEFEPIME Value in next row Resistant      RESISTANT>=64    IMIPENEM Value in next row Sensitive      SENSITIVE0.5    GENTAMICIN Value in next row Sensitive       SENSITIVE<=1    CIPROFLOXACIN Value in next row Sensitive      SENSITIVE<=0.25    TRIMETH/SULFA Value in next row Sensitive      SENSITIVE<=20    * HEAVY GROWTH ENTEROBACTER CLOACAE    Coagulation Studies: No results for input(s): LABPROT, INR in the last 72 hours.  Urinalysis: No results for input(s): COLORURINE, LABSPEC, PHURINE, GLUCOSEU, HGBUR, BILIRUBINUR, KETONESUR, PROTEINUR, UROBILINOGEN, NITRITE, LEUKOCYTESUR in the last 72 hours.  Invalid input(s): APPERANCEUR    Imaging: No results found.   Medications:     . sodium chloride   Intravenous Once  . amLODipine  5 mg Oral Daily  . antiseptic oral rinse  7 mL Mouth Rinse BID  . ciprofloxacin  500 mg Oral QHS  . DAPTOmycin (CUBICIN)  IV  500 mg Intravenous Q48H  . docusate sodium  100 mg Oral BID  . famotidine  20 mg Oral Daily  . fluticasone  2 spray Each Nare Daily  . hydrALAZINE  25 mg Oral 3 times per day  . levalbuterol  0.63 mg Nebulization TID  . levothyroxine  88 mcg Oral QAC breakfast  . metoprolol tartrate  25 mg Oral 3 times per day  . multivitamin  1 tablet Oral Daily  . pantoprazole  40 mg Oral BID AC  . sodium chloride  1 spray Each Nare 5 X Daily  . sodium chloride flush  3 mL Intravenous Q12H  . sodium chloride flush  3 mL Intravenous Q12H   sodium chloride, acetaminophen, albuterol, ALPRAZolam, anticoagulant sodium citrate, chlorpheniramine-HYDROcodone, HYDROcodone-acetaminophen, magnesium hydroxide, ondansetron (ZOFRAN) IV, sodium chloride flush  Assessment/ Plan:  65 y.o. female with COPD, hypertension, hyperlipidemia, vaginal intraepithelial neoplasia, emergent AAA repair status post rupture on November 18, left to right femoral to femoral bypass , acute renal failure leading to permanent dialysis, significant 3 vessel disease, HIT positive, thrombocytopenia.  [facility]ph[facility] TTS [place]  1.  ESRD on HD:  Monitor daily for dialysis. Continue to have significant pulmonary edema. Has  received five consecutive days of hemodialysis with total ultrafiltration of >9 litres.  - Patient seen during dialysis Tolerating well  UF goal 2 Liters today  2.  Anemia of CKD: with duodenal AVM.  status post 2 units PRBC on 4/20 - epo with treatment TTS  3.  Secondary Hyperparathyroidism: not currently on binders. PTH low at 21 on 06/18/15  4. Hypertension with diastolic congestive heart failure: acute exacerbation.  - amlodipine, hydralazine and metoprolol  5. Infected seroma:  Abx as per ID team- CIPRO F/u ID as outpatient   LOS: 7 [name] 4/25/20172:34 PM

## 2015-07-23 NOTE — Progress Notes (Signed)
Pre dialysis assessment 

## 2015-07-25 DIAGNOSIS — N186 End stage renal disease: Secondary | ICD-10-CM | POA: Diagnosis not present

## 2015-07-25 DIAGNOSIS — Z992 Dependence on renal dialysis: Secondary | ICD-10-CM | POA: Diagnosis not present

## 2015-07-25 DIAGNOSIS — R7881 Bacteremia: Secondary | ICD-10-CM | POA: Diagnosis not present

## 2015-07-26 DIAGNOSIS — N186 End stage renal disease: Secondary | ICD-10-CM | POA: Diagnosis not present

## 2015-07-26 DIAGNOSIS — E8770 Fluid overload, unspecified: Secondary | ICD-10-CM | POA: Diagnosis not present

## 2015-07-26 DIAGNOSIS — Z992 Dependence on renal dialysis: Secondary | ICD-10-CM | POA: Diagnosis not present

## 2015-07-26 DIAGNOSIS — T814XXD Infection following a procedure, subsequent encounter: Secondary | ICD-10-CM | POA: Diagnosis not present

## 2015-07-27 DIAGNOSIS — N186 End stage renal disease: Secondary | ICD-10-CM | POA: Diagnosis not present

## 2015-07-27 DIAGNOSIS — R7881 Bacteremia: Secondary | ICD-10-CM | POA: Diagnosis not present

## 2015-07-27 DIAGNOSIS — Z992 Dependence on renal dialysis: Secondary | ICD-10-CM | POA: Diagnosis not present

## 2015-07-28 DIAGNOSIS — N186 End stage renal disease: Secondary | ICD-10-CM | POA: Diagnosis not present

## 2015-07-28 DIAGNOSIS — Z992 Dependence on renal dialysis: Secondary | ICD-10-CM | POA: Diagnosis not present

## 2015-07-30 ENCOUNTER — Ambulatory Visit: Payer: Medicare Other | Admitting: Family Medicine

## 2015-07-30 DIAGNOSIS — D631 Anemia in chronic kidney disease: Secondary | ICD-10-CM | POA: Diagnosis not present

## 2015-07-30 DIAGNOSIS — Z992 Dependence on renal dialysis: Secondary | ICD-10-CM | POA: Diagnosis not present

## 2015-07-30 DIAGNOSIS — N186 End stage renal disease: Secondary | ICD-10-CM | POA: Diagnosis not present

## 2015-07-30 DIAGNOSIS — D509 Iron deficiency anemia, unspecified: Secondary | ICD-10-CM | POA: Diagnosis not present

## 2015-08-01 ENCOUNTER — Ambulatory Visit: Payer: Medicare Other | Admitting: Family

## 2015-08-01 DIAGNOSIS — D631 Anemia in chronic kidney disease: Secondary | ICD-10-CM | POA: Diagnosis not present

## 2015-08-01 DIAGNOSIS — D509 Iron deficiency anemia, unspecified: Secondary | ICD-10-CM | POA: Diagnosis not present

## 2015-08-01 DIAGNOSIS — N186 End stage renal disease: Secondary | ICD-10-CM | POA: Diagnosis not present

## 2015-08-01 DIAGNOSIS — Z992 Dependence on renal dialysis: Secondary | ICD-10-CM | POA: Diagnosis not present

## 2015-08-03 DIAGNOSIS — D631 Anemia in chronic kidney disease: Secondary | ICD-10-CM | POA: Diagnosis not present

## 2015-08-03 DIAGNOSIS — D509 Iron deficiency anemia, unspecified: Secondary | ICD-10-CM | POA: Diagnosis not present

## 2015-08-03 DIAGNOSIS — N186 End stage renal disease: Secondary | ICD-10-CM | POA: Diagnosis not present

## 2015-08-03 DIAGNOSIS — Z992 Dependence on renal dialysis: Secondary | ICD-10-CM | POA: Diagnosis not present

## 2015-08-06 DIAGNOSIS — D631 Anemia in chronic kidney disease: Secondary | ICD-10-CM | POA: Diagnosis not present

## 2015-08-06 DIAGNOSIS — D509 Iron deficiency anemia, unspecified: Secondary | ICD-10-CM | POA: Diagnosis not present

## 2015-08-06 DIAGNOSIS — N186 End stage renal disease: Secondary | ICD-10-CM | POA: Diagnosis not present

## 2015-08-06 DIAGNOSIS — Z992 Dependence on renal dialysis: Secondary | ICD-10-CM | POA: Diagnosis not present

## 2015-08-08 DIAGNOSIS — M6281 Muscle weakness (generalized): Secondary | ICD-10-CM | POA: Diagnosis not present

## 2015-08-08 DIAGNOSIS — I132 Hypertensive heart and chronic kidney disease with heart failure and with stage 5 chronic kidney disease, or end stage renal disease: Secondary | ICD-10-CM | POA: Diagnosis not present

## 2015-08-08 DIAGNOSIS — D649 Anemia, unspecified: Secondary | ICD-10-CM | POA: Diagnosis not present

## 2015-08-08 DIAGNOSIS — D509 Iron deficiency anemia, unspecified: Secondary | ICD-10-CM | POA: Diagnosis not present

## 2015-08-08 DIAGNOSIS — N186 End stage renal disease: Secondary | ICD-10-CM | POA: Diagnosis not present

## 2015-08-08 DIAGNOSIS — Z992 Dependence on renal dialysis: Secondary | ICD-10-CM | POA: Diagnosis not present

## 2015-08-08 DIAGNOSIS — R262 Difficulty in walking, not elsewhere classified: Secondary | ICD-10-CM | POA: Diagnosis not present

## 2015-08-08 DIAGNOSIS — D631 Anemia in chronic kidney disease: Secondary | ICD-10-CM | POA: Diagnosis not present

## 2015-08-09 DIAGNOSIS — D649 Anemia, unspecified: Secondary | ICD-10-CM | POA: Diagnosis not present

## 2015-08-09 DIAGNOSIS — S31109D Unspecified open wound of abdominal wall, unspecified quadrant without penetration into peritoneal cavity, subsequent encounter: Secondary | ICD-10-CM | POA: Diagnosis not present

## 2015-08-09 DIAGNOSIS — R262 Difficulty in walking, not elsewhere classified: Secondary | ICD-10-CM | POA: Diagnosis not present

## 2015-08-09 DIAGNOSIS — M6281 Muscle weakness (generalized): Secondary | ICD-10-CM | POA: Diagnosis not present

## 2015-08-09 DIAGNOSIS — I132 Hypertensive heart and chronic kidney disease with heart failure and with stage 5 chronic kidney disease, or end stage renal disease: Secondary | ICD-10-CM | POA: Diagnosis not present

## 2015-08-10 DIAGNOSIS — D509 Iron deficiency anemia, unspecified: Secondary | ICD-10-CM | POA: Diagnosis not present

## 2015-08-10 DIAGNOSIS — Z992 Dependence on renal dialysis: Secondary | ICD-10-CM | POA: Diagnosis not present

## 2015-08-10 DIAGNOSIS — N186 End stage renal disease: Secondary | ICD-10-CM | POA: Diagnosis not present

## 2015-08-10 DIAGNOSIS — D631 Anemia in chronic kidney disease: Secondary | ICD-10-CM | POA: Diagnosis not present

## 2015-08-12 DIAGNOSIS — I132 Hypertensive heart and chronic kidney disease with heart failure and with stage 5 chronic kidney disease, or end stage renal disease: Secondary | ICD-10-CM | POA: Diagnosis not present

## 2015-08-12 DIAGNOSIS — M6281 Muscle weakness (generalized): Secondary | ICD-10-CM | POA: Diagnosis not present

## 2015-08-12 DIAGNOSIS — R262 Difficulty in walking, not elsewhere classified: Secondary | ICD-10-CM | POA: Diagnosis not present

## 2015-08-12 DIAGNOSIS — D649 Anemia, unspecified: Secondary | ICD-10-CM | POA: Diagnosis not present

## 2015-08-13 DIAGNOSIS — R262 Difficulty in walking, not elsewhere classified: Secondary | ICD-10-CM | POA: Diagnosis not present

## 2015-08-13 DIAGNOSIS — N186 End stage renal disease: Secondary | ICD-10-CM | POA: Diagnosis not present

## 2015-08-13 DIAGNOSIS — D631 Anemia in chronic kidney disease: Secondary | ICD-10-CM | POA: Diagnosis not present

## 2015-08-13 DIAGNOSIS — Z992 Dependence on renal dialysis: Secondary | ICD-10-CM | POA: Diagnosis not present

## 2015-08-13 DIAGNOSIS — D649 Anemia, unspecified: Secondary | ICD-10-CM | POA: Diagnosis not present

## 2015-08-13 DIAGNOSIS — M6281 Muscle weakness (generalized): Secondary | ICD-10-CM | POA: Diagnosis not present

## 2015-08-13 DIAGNOSIS — D509 Iron deficiency anemia, unspecified: Secondary | ICD-10-CM | POA: Diagnosis not present

## 2015-08-13 DIAGNOSIS — I132 Hypertensive heart and chronic kidney disease with heart failure and with stage 5 chronic kidney disease, or end stage renal disease: Secondary | ICD-10-CM | POA: Diagnosis not present

## 2015-08-14 DIAGNOSIS — T827XXD Infection and inflammatory reaction due to other cardiac and vascular devices, implants and grafts, subsequent encounter: Secondary | ICD-10-CM | POA: Diagnosis not present

## 2015-08-14 DIAGNOSIS — D649 Anemia, unspecified: Secondary | ICD-10-CM | POA: Diagnosis not present

## 2015-08-14 DIAGNOSIS — N186 End stage renal disease: Secondary | ICD-10-CM | POA: Diagnosis not present

## 2015-08-14 DIAGNOSIS — Z792 Long term (current) use of antibiotics: Secondary | ICD-10-CM | POA: Diagnosis not present

## 2015-08-14 DIAGNOSIS — M6281 Muscle weakness (generalized): Secondary | ICD-10-CM | POA: Diagnosis not present

## 2015-08-14 DIAGNOSIS — R262 Difficulty in walking, not elsewhere classified: Secondary | ICD-10-CM | POA: Diagnosis not present

## 2015-08-14 DIAGNOSIS — I132 Hypertensive heart and chronic kidney disease with heart failure and with stage 5 chronic kidney disease, or end stage renal disease: Secondary | ICD-10-CM | POA: Diagnosis not present

## 2015-08-15 DIAGNOSIS — M6281 Muscle weakness (generalized): Secondary | ICD-10-CM | POA: Diagnosis not present

## 2015-08-15 DIAGNOSIS — Z992 Dependence on renal dialysis: Secondary | ICD-10-CM | POA: Diagnosis not present

## 2015-08-15 DIAGNOSIS — D631 Anemia in chronic kidney disease: Secondary | ICD-10-CM | POA: Diagnosis not present

## 2015-08-15 DIAGNOSIS — N186 End stage renal disease: Secondary | ICD-10-CM | POA: Diagnosis not present

## 2015-08-15 DIAGNOSIS — I132 Hypertensive heart and chronic kidney disease with heart failure and with stage 5 chronic kidney disease, or end stage renal disease: Secondary | ICD-10-CM | POA: Diagnosis not present

## 2015-08-15 DIAGNOSIS — D649 Anemia, unspecified: Secondary | ICD-10-CM | POA: Diagnosis not present

## 2015-08-15 DIAGNOSIS — D509 Iron deficiency anemia, unspecified: Secondary | ICD-10-CM | POA: Diagnosis not present

## 2015-08-15 DIAGNOSIS — R262 Difficulty in walking, not elsewhere classified: Secondary | ICD-10-CM | POA: Diagnosis not present

## 2015-08-16 DIAGNOSIS — D649 Anemia, unspecified: Secondary | ICD-10-CM | POA: Diagnosis not present

## 2015-08-16 DIAGNOSIS — I132 Hypertensive heart and chronic kidney disease with heart failure and with stage 5 chronic kidney disease, or end stage renal disease: Secondary | ICD-10-CM | POA: Diagnosis not present

## 2015-08-16 DIAGNOSIS — M6281 Muscle weakness (generalized): Secondary | ICD-10-CM | POA: Diagnosis not present

## 2015-08-16 DIAGNOSIS — R262 Difficulty in walking, not elsewhere classified: Secondary | ICD-10-CM | POA: Diagnosis not present

## 2015-08-17 DIAGNOSIS — N186 End stage renal disease: Secondary | ICD-10-CM | POA: Diagnosis not present

## 2015-08-17 DIAGNOSIS — Z992 Dependence on renal dialysis: Secondary | ICD-10-CM | POA: Diagnosis not present

## 2015-08-17 DIAGNOSIS — D509 Iron deficiency anemia, unspecified: Secondary | ICD-10-CM | POA: Diagnosis not present

## 2015-08-17 DIAGNOSIS — D631 Anemia in chronic kidney disease: Secondary | ICD-10-CM | POA: Diagnosis not present

## 2015-08-19 DIAGNOSIS — I132 Hypertensive heart and chronic kidney disease with heart failure and with stage 5 chronic kidney disease, or end stage renal disease: Secondary | ICD-10-CM | POA: Diagnosis not present

## 2015-08-19 DIAGNOSIS — M6281 Muscle weakness (generalized): Secondary | ICD-10-CM | POA: Diagnosis not present

## 2015-08-19 DIAGNOSIS — R262 Difficulty in walking, not elsewhere classified: Secondary | ICD-10-CM | POA: Diagnosis not present

## 2015-08-19 DIAGNOSIS — D649 Anemia, unspecified: Secondary | ICD-10-CM | POA: Diagnosis not present

## 2015-08-20 DIAGNOSIS — I132 Hypertensive heart and chronic kidney disease with heart failure and with stage 5 chronic kidney disease, or end stage renal disease: Secondary | ICD-10-CM | POA: Diagnosis not present

## 2015-08-20 DIAGNOSIS — D649 Anemia, unspecified: Secondary | ICD-10-CM | POA: Diagnosis not present

## 2015-08-20 DIAGNOSIS — D631 Anemia in chronic kidney disease: Secondary | ICD-10-CM | POA: Diagnosis not present

## 2015-08-20 DIAGNOSIS — N186 End stage renal disease: Secondary | ICD-10-CM | POA: Diagnosis not present

## 2015-08-20 DIAGNOSIS — Z992 Dependence on renal dialysis: Secondary | ICD-10-CM | POA: Diagnosis not present

## 2015-08-20 DIAGNOSIS — M6281 Muscle weakness (generalized): Secondary | ICD-10-CM | POA: Diagnosis not present

## 2015-08-20 DIAGNOSIS — D509 Iron deficiency anemia, unspecified: Secondary | ICD-10-CM | POA: Diagnosis not present

## 2015-08-20 DIAGNOSIS — R262 Difficulty in walking, not elsewhere classified: Secondary | ICD-10-CM | POA: Diagnosis not present

## 2015-08-21 DIAGNOSIS — M6281 Muscle weakness (generalized): Secondary | ICD-10-CM | POA: Diagnosis not present

## 2015-08-21 DIAGNOSIS — I2510 Atherosclerotic heart disease of native coronary artery without angina pectoris: Secondary | ICD-10-CM | POA: Diagnosis not present

## 2015-08-21 DIAGNOSIS — D649 Anemia, unspecified: Secondary | ICD-10-CM | POA: Diagnosis not present

## 2015-08-21 DIAGNOSIS — R262 Difficulty in walking, not elsewhere classified: Secondary | ICD-10-CM | POA: Diagnosis not present

## 2015-08-21 DIAGNOSIS — J449 Chronic obstructive pulmonary disease, unspecified: Secondary | ICD-10-CM | POA: Diagnosis not present

## 2015-08-21 DIAGNOSIS — I132 Hypertensive heart and chronic kidney disease with heart failure and with stage 5 chronic kidney disease, or end stage renal disease: Secondary | ICD-10-CM | POA: Diagnosis not present

## 2015-08-21 DIAGNOSIS — I120 Hypertensive chronic kidney disease with stage 5 chronic kidney disease or end stage renal disease: Secondary | ICD-10-CM | POA: Diagnosis not present

## 2015-08-21 DIAGNOSIS — N186 End stage renal disease: Secondary | ICD-10-CM | POA: Diagnosis not present

## 2015-08-21 DIAGNOSIS — K219 Gastro-esophageal reflux disease without esophagitis: Secondary | ICD-10-CM | POA: Diagnosis not present

## 2015-08-22 DIAGNOSIS — D631 Anemia in chronic kidney disease: Secondary | ICD-10-CM | POA: Diagnosis not present

## 2015-08-22 DIAGNOSIS — D509 Iron deficiency anemia, unspecified: Secondary | ICD-10-CM | POA: Diagnosis not present

## 2015-08-22 DIAGNOSIS — Z992 Dependence on renal dialysis: Secondary | ICD-10-CM | POA: Diagnosis not present

## 2015-08-22 DIAGNOSIS — R262 Difficulty in walking, not elsewhere classified: Secondary | ICD-10-CM | POA: Diagnosis not present

## 2015-08-22 DIAGNOSIS — D649 Anemia, unspecified: Secondary | ICD-10-CM | POA: Diagnosis not present

## 2015-08-22 DIAGNOSIS — N186 End stage renal disease: Secondary | ICD-10-CM | POA: Diagnosis not present

## 2015-08-22 DIAGNOSIS — I132 Hypertensive heart and chronic kidney disease with heart failure and with stage 5 chronic kidney disease, or end stage renal disease: Secondary | ICD-10-CM | POA: Diagnosis not present

## 2015-08-22 DIAGNOSIS — M6281 Muscle weakness (generalized): Secondary | ICD-10-CM | POA: Diagnosis not present

## 2015-08-23 DIAGNOSIS — I132 Hypertensive heart and chronic kidney disease with heart failure and with stage 5 chronic kidney disease, or end stage renal disease: Secondary | ICD-10-CM | POA: Diagnosis not present

## 2015-08-23 DIAGNOSIS — R262 Difficulty in walking, not elsewhere classified: Secondary | ICD-10-CM | POA: Diagnosis not present

## 2015-08-23 DIAGNOSIS — M6281 Muscle weakness (generalized): Secondary | ICD-10-CM | POA: Diagnosis not present

## 2015-08-23 DIAGNOSIS — D649 Anemia, unspecified: Secondary | ICD-10-CM | POA: Diagnosis not present

## 2015-08-24 DIAGNOSIS — N186 End stage renal disease: Secondary | ICD-10-CM | POA: Diagnosis not present

## 2015-08-24 DIAGNOSIS — Z992 Dependence on renal dialysis: Secondary | ICD-10-CM | POA: Diagnosis not present

## 2015-08-24 DIAGNOSIS — D509 Iron deficiency anemia, unspecified: Secondary | ICD-10-CM | POA: Diagnosis not present

## 2015-08-24 DIAGNOSIS — D631 Anemia in chronic kidney disease: Secondary | ICD-10-CM | POA: Diagnosis not present

## 2015-08-26 DIAGNOSIS — I132 Hypertensive heart and chronic kidney disease with heart failure and with stage 5 chronic kidney disease, or end stage renal disease: Secondary | ICD-10-CM | POA: Diagnosis not present

## 2015-08-26 DIAGNOSIS — D649 Anemia, unspecified: Secondary | ICD-10-CM | POA: Diagnosis not present

## 2015-08-26 DIAGNOSIS — R262 Difficulty in walking, not elsewhere classified: Secondary | ICD-10-CM | POA: Diagnosis not present

## 2015-08-26 DIAGNOSIS — M6281 Muscle weakness (generalized): Secondary | ICD-10-CM | POA: Diagnosis not present

## 2015-08-27 DIAGNOSIS — Z992 Dependence on renal dialysis: Secondary | ICD-10-CM | POA: Diagnosis not present

## 2015-08-27 DIAGNOSIS — D509 Iron deficiency anemia, unspecified: Secondary | ICD-10-CM | POA: Diagnosis not present

## 2015-08-27 DIAGNOSIS — M6281 Muscle weakness (generalized): Secondary | ICD-10-CM | POA: Diagnosis not present

## 2015-08-27 DIAGNOSIS — N186 End stage renal disease: Secondary | ICD-10-CM | POA: Diagnosis not present

## 2015-08-27 DIAGNOSIS — R262 Difficulty in walking, not elsewhere classified: Secondary | ICD-10-CM | POA: Diagnosis not present

## 2015-08-27 DIAGNOSIS — D649 Anemia, unspecified: Secondary | ICD-10-CM | POA: Diagnosis not present

## 2015-08-27 DIAGNOSIS — I132 Hypertensive heart and chronic kidney disease with heart failure and with stage 5 chronic kidney disease, or end stage renal disease: Secondary | ICD-10-CM | POA: Diagnosis not present

## 2015-08-27 DIAGNOSIS — D631 Anemia in chronic kidney disease: Secondary | ICD-10-CM | POA: Diagnosis not present

## 2015-08-28 DIAGNOSIS — N186 End stage renal disease: Secondary | ICD-10-CM | POA: Diagnosis not present

## 2015-08-28 DIAGNOSIS — Z992 Dependence on renal dialysis: Secondary | ICD-10-CM | POA: Diagnosis not present

## 2015-08-28 DIAGNOSIS — I132 Hypertensive heart and chronic kidney disease with heart failure and with stage 5 chronic kidney disease, or end stage renal disease: Secondary | ICD-10-CM | POA: Diagnosis not present

## 2015-08-28 DIAGNOSIS — R262 Difficulty in walking, not elsewhere classified: Secondary | ICD-10-CM | POA: Diagnosis not present

## 2015-08-28 DIAGNOSIS — D649 Anemia, unspecified: Secondary | ICD-10-CM | POA: Diagnosis not present

## 2015-08-28 DIAGNOSIS — M6281 Muscle weakness (generalized): Secondary | ICD-10-CM | POA: Diagnosis not present

## 2015-08-29 ENCOUNTER — Telehealth: Payer: Self-pay

## 2015-08-29 DIAGNOSIS — N186 End stage renal disease: Secondary | ICD-10-CM | POA: Diagnosis not present

## 2015-08-29 DIAGNOSIS — Z992 Dependence on renal dialysis: Secondary | ICD-10-CM | POA: Diagnosis not present

## 2015-08-29 DIAGNOSIS — D509 Iron deficiency anemia, unspecified: Secondary | ICD-10-CM | POA: Diagnosis not present

## 2015-08-29 DIAGNOSIS — D631 Anemia in chronic kidney disease: Secondary | ICD-10-CM | POA: Diagnosis not present

## 2015-08-29 NOTE — Telephone Encounter (Signed)
Yes please give verbal order to continue with palliative care.

## 2015-08-29 NOTE — Telephone Encounter (Signed)
Ivin Booty NP with [facility] said that pt had pallative med consultation while at [facility]; pt discharged today and Ivin Booty wants to know if [name] wants to continue and do referral for pallative med consultation. If [name] wants to continue call [phone]. If [name] has any questions call Ivin Booty at [phone].

## 2015-08-30 NOTE — Telephone Encounter (Signed)
Lm on Sharon's vm and provided verbal orders per [name].

## 2015-08-30 NOTE — Telephone Encounter (Signed)
Spoke to Erma, who states message was taken incorrectly, and verbal orders would not suffice. Contacted office back and form has been faxed for completion

## 2015-08-31 DIAGNOSIS — D631 Anemia in chronic kidney disease: Secondary | ICD-10-CM | POA: Diagnosis not present

## 2015-08-31 DIAGNOSIS — Z992 Dependence on renal dialysis: Secondary | ICD-10-CM | POA: Diagnosis not present

## 2015-08-31 DIAGNOSIS — D509 Iron deficiency anemia, unspecified: Secondary | ICD-10-CM | POA: Diagnosis not present

## 2015-08-31 DIAGNOSIS — N186 End stage renal disease: Secondary | ICD-10-CM | POA: Diagnosis not present

## 2015-09-01 DIAGNOSIS — H8110 Benign paroxysmal vertigo, unspecified ear: Secondary | ICD-10-CM | POA: Diagnosis not present

## 2015-09-01 DIAGNOSIS — I252 Old myocardial infarction: Secondary | ICD-10-CM | POA: Diagnosis not present

## 2015-09-01 DIAGNOSIS — F419 Anxiety disorder, unspecified: Secondary | ICD-10-CM | POA: Diagnosis not present

## 2015-09-01 DIAGNOSIS — I132 Hypertensive heart and chronic kidney disease with heart failure and with stage 5 chronic kidney disease, or end stage renal disease: Secondary | ICD-10-CM | POA: Diagnosis not present

## 2015-09-01 DIAGNOSIS — Z9981 Dependence on supplemental oxygen: Secondary | ICD-10-CM | POA: Diagnosis not present

## 2015-09-01 DIAGNOSIS — Z992 Dependence on renal dialysis: Secondary | ICD-10-CM | POA: Diagnosis not present

## 2015-09-01 DIAGNOSIS — J9611 Chronic respiratory failure with hypoxia: Secondary | ICD-10-CM | POA: Diagnosis not present

## 2015-09-01 DIAGNOSIS — D631 Anemia in chronic kidney disease: Secondary | ICD-10-CM | POA: Diagnosis not present

## 2015-09-01 DIAGNOSIS — J449 Chronic obstructive pulmonary disease, unspecified: Secondary | ICD-10-CM | POA: Diagnosis not present

## 2015-09-01 DIAGNOSIS — E785 Hyperlipidemia, unspecified: Secondary | ICD-10-CM | POA: Diagnosis not present

## 2015-09-01 DIAGNOSIS — E039 Hypothyroidism, unspecified: Secondary | ICD-10-CM | POA: Diagnosis not present

## 2015-09-01 DIAGNOSIS — G894 Chronic pain syndrome: Secondary | ICD-10-CM | POA: Diagnosis not present

## 2015-09-01 DIAGNOSIS — I2510 Atherosclerotic heart disease of native coronary artery without angina pectoris: Secondary | ICD-10-CM | POA: Diagnosis not present

## 2015-09-01 DIAGNOSIS — Z8701 Personal history of pneumonia (recurrent): Secondary | ICD-10-CM | POA: Diagnosis not present

## 2015-09-01 DIAGNOSIS — I5030 Unspecified diastolic (congestive) heart failure: Secondary | ICD-10-CM | POA: Diagnosis not present

## 2015-09-01 DIAGNOSIS — N186 End stage renal disease: Secondary | ICD-10-CM | POA: Diagnosis not present

## 2015-09-02 ENCOUNTER — Telehealth: Payer: Self-pay | Admitting: Family Medicine

## 2015-09-02 DIAGNOSIS — N185 Chronic kidney disease, stage 5: Secondary | ICD-10-CM | POA: Diagnosis not present

## 2015-09-02 DIAGNOSIS — R0602 Shortness of breath: Secondary | ICD-10-CM | POA: Diagnosis not present

## 2015-09-02 DIAGNOSIS — T888XXA Other specified complications of surgical and medical care, not elsewhere classified, initial encounter: Secondary | ICD-10-CM | POA: Diagnosis not present

## 2015-09-02 DIAGNOSIS — D72829 Elevated white blood cell count, unspecified: Secondary | ICD-10-CM | POA: Diagnosis not present

## 2015-09-02 DIAGNOSIS — Y841 Kidney dialysis as the cause of abnormal reaction of the patient, or of later complication, without mention of misadventure at the time of the procedure: Secondary | ICD-10-CM | POA: Diagnosis not present

## 2015-09-02 DIAGNOSIS — Z992 Dependence on renal dialysis: Secondary | ICD-10-CM | POA: Diagnosis not present

## 2015-09-02 DIAGNOSIS — I890 Lymphedema, not elsewhere classified: Secondary | ICD-10-CM | POA: Diagnosis not present

## 2015-09-02 DIAGNOSIS — E876 Hypokalemia: Secondary | ICD-10-CM | POA: Diagnosis not present

## 2015-09-02 DIAGNOSIS — A4152 Sepsis due to Pseudomonas: Secondary | ICD-10-CM | POA: Diagnosis not present

## 2015-09-02 DIAGNOSIS — I714 Abdominal aortic aneurysm, without rupture: Secondary | ICD-10-CM | POA: Diagnosis not present

## 2015-09-02 DIAGNOSIS — L7634 Postprocedural seroma of skin and subcutaneous tissue following other procedure: Secondary | ICD-10-CM | POA: Diagnosis not present

## 2015-09-02 DIAGNOSIS — N186 End stage renal disease: Secondary | ICD-10-CM | POA: Diagnosis not present

## 2015-09-02 NOTE — Telephone Encounter (Signed)
tabatha from [facility] called -  Requesting verbal orders for home health 2 for 3 weeks 1 for 2 weeks Every other week for 3 weeks With 2 PRN visits cb number [phone] Thank you

## 2015-09-02 NOTE — Telephone Encounter (Signed)
Ok to give verbal orders as requested. 

## 2015-09-02 NOTE — Telephone Encounter (Signed)
Spoke to [place] and provided verbal orders

## 2015-09-03 DIAGNOSIS — Z992 Dependence on renal dialysis: Secondary | ICD-10-CM | POA: Diagnosis not present

## 2015-09-03 DIAGNOSIS — D631 Anemia in chronic kidney disease: Secondary | ICD-10-CM | POA: Diagnosis not present

## 2015-09-03 DIAGNOSIS — N186 End stage renal disease: Secondary | ICD-10-CM | POA: Diagnosis not present

## 2015-09-03 DIAGNOSIS — D509 Iron deficiency anemia, unspecified: Secondary | ICD-10-CM | POA: Diagnosis not present

## 2015-09-04 ENCOUNTER — Ambulatory Visit (INDEPENDENT_AMBULATORY_CARE_PROVIDER_SITE_OTHER): Payer: Medicare Other | Admitting: Internal Medicine

## 2015-09-04 ENCOUNTER — Telehealth: Payer: Self-pay | Admitting: *Deleted

## 2015-09-04 ENCOUNTER — Encounter: Payer: Self-pay | Admitting: Internal Medicine

## 2015-09-04 ENCOUNTER — Encounter: Payer: Self-pay | Admitting: Family Medicine

## 2015-09-04 DIAGNOSIS — E039 Hypothyroidism, unspecified: Secondary | ICD-10-CM | POA: Diagnosis not present

## 2015-09-04 DIAGNOSIS — I10 Essential (primary) hypertension: Secondary | ICD-10-CM

## 2015-09-04 DIAGNOSIS — E785 Hyperlipidemia, unspecified: Secondary | ICD-10-CM

## 2015-09-04 DIAGNOSIS — J449 Chronic obstructive pulmonary disease, unspecified: Secondary | ICD-10-CM | POA: Diagnosis not present

## 2015-09-04 DIAGNOSIS — N186 End stage renal disease: Secondary | ICD-10-CM

## 2015-09-04 DIAGNOSIS — G4700 Insomnia, unspecified: Secondary | ICD-10-CM

## 2015-09-04 DIAGNOSIS — Z992 Dependence on renal dialysis: Secondary | ICD-10-CM

## 2015-09-04 DIAGNOSIS — E2839 Other primary ovarian failure: Secondary | ICD-10-CM | POA: Diagnosis not present

## 2015-09-04 DIAGNOSIS — A419 Sepsis, unspecified organism: Secondary | ICD-10-CM | POA: Diagnosis not present

## 2015-09-04 DIAGNOSIS — T827XXD Infection and inflammatory reaction due to other cardiac and vascular devices, implants and grafts, subsequent encounter: Secondary | ICD-10-CM | POA: Diagnosis not present

## 2015-09-04 DIAGNOSIS — Z1621 Resistance to vancomycin: Secondary | ICD-10-CM | POA: Diagnosis not present

## 2015-09-04 DIAGNOSIS — M79604 Pain in right leg: Secondary | ICD-10-CM

## 2015-09-04 DIAGNOSIS — A491 Streptococcal infection, unspecified site: Secondary | ICD-10-CM | POA: Diagnosis not present

## 2015-09-04 DIAGNOSIS — I5033 Acute on chronic diastolic (congestive) heart failure: Secondary | ICD-10-CM

## 2015-09-04 DIAGNOSIS — M79605 Pain in left leg: Secondary | ICD-10-CM

## 2015-09-04 LAB — T4, FREE: FREE T4: 1.15 ng/dL (ref 0.60–1.60)

## 2015-09-04 LAB — COMPREHENSIVE METABOLIC PANEL
ALT: 10 U/L (ref 0–35)
AST: 20 U/L (ref 0–37)
Albumin: 3.8 g/dL (ref 3.5–5.2)
Alkaline Phosphatase: 74 U/L (ref 39–117)
BUN: 30 mg/dL — AB (ref 6–23)
CHLORIDE: 98 meq/L (ref 96–112)
CO2: 33 meq/L — AB (ref 19–32)
Calcium: 9.4 mg/dL (ref 8.4–10.5)
Creatinine, Ser: 4.52 mg/dL (ref 0.40–1.20)
GFR: 10.32 mL/min — CL (ref >60.00)
Glucose, Bld: 84 mg/dL (ref 70–99)
Potassium: 4.1 mEq/L (ref 3.5–5.1)
SODIUM: 141 meq/L (ref 135–145)
Total Bilirubin: 0.7 mg/dL (ref 0.2–1.2)
Total Protein: 6.7 g/dL (ref 6.0–8.3)

## 2015-09-04 LAB — CBC
HCT: 32.8 % — ABNORMAL LOW (ref 36.0–46.0)
Hemoglobin: 10.9 g/dL — ABNORMAL LOW (ref 12.0–15.0)
MCHC: 33.2 g/dL (ref 30.0–36.0)
MCV: 93.1 fl (ref 78.0–100.0)
Platelets: 187 10*3/uL (ref 150.0–400.0)
RBC: 3.53 Mil/uL — ABNORMAL LOW (ref 3.87–5.11)
RDW: 15 % (ref 11.5–15.5)
WBC: 5 10*3/uL (ref 4.0–10.5)

## 2015-09-04 LAB — VITAMIN D 25 HYDROXY (VIT D DEFICIENCY, FRACTURES): VITD: 60.24 ng/mL (ref 30.00–100.00)

## 2015-09-04 LAB — LIPID PANEL
Cholesterol: 123 mg/dL (ref 0–200)
HDL: 59.6 mg/dL (ref >39.00)
LDL CALC: 48 mg/dL (ref 0–99)
NONHDL: 63.71
Total CHOL/HDL Ratio: 2
Triglycerides: 78 mg/dL (ref 0.0–149.0)
VLDL: 15.6 mg/dL (ref 0.0–40.0)

## 2015-09-04 LAB — TSH: TSH: 24.47 u[IU]/mL — ABNORMAL HIGH (ref 0.35–4.50)

## 2015-09-04 MED ORDER — HYDROCODONE-ACETAMINOPHEN 5-325 MG PO TABS: 1.0000 | ORAL_TABLET | Freq: Four times a day (QID) | ORAL | Status: DC | PRN

## 2015-09-04 MED ORDER — ALPRAZOLAM 0.25 MG PO TABS: 0.2500 mg | ORAL_TABLET | Freq: Two times a day (BID) | ORAL | Status: DC | PRN

## 2015-09-04 NOTE — Patient Instructions (Signed)
Sepsis, Adult Sepsis is a serious infection of your blood or tissues that affects your whole body. The infection that causes sepsis may be bacterial, viral, fungal, or parasitic. Sepsis may be life threatening. Sepsis can cause your blood pressure to drop. This may result in shock. Shock causes your central nervous system and your organs to stop working correctly.  RISK FACTORS Sepsis can happen in anyone, but it is more likely to happen in people who have weakened immune systems. SIGNS AND SYMPTOMS  Symptoms of sepsis can include:  Fever or low body temperature (hypothermia).  Rapid breathing (hyperventilation).  Chills.  Rapid heartbeat (tachycardia).  Confusion or light-headedness.  Trouble breathing.  Urinating much less than usual.  Cool, clammy skin or red, flushed skin.  Other problems with the heart, kidneys, or brain. DIAGNOSIS  Your health care provider will likely do tests to look for an infection, to see if the infection has spread to your blood, and to see how serious your condition is. Tests can include:  Blood tests, including cultures of your blood.  Cultures of other fluids from your body, such as:  Urine.  Pus from wounds.  Mucus coughed up from your lungs.  Urine tests other than cultures.  X-ray exams or other imaging tests. TREATMENT  Treatment will begin with elimination of the source of infection. If your sepsis is likely caused by a bacterial or fungal infection, you will be given antibiotic or antifungal medicines. You may also receive:  Oxygen.  Fluids through an IV tube.  Medicines to increase your blood pressure.  A machine to clean your blood (dialysis) if your kidneys fail.  A machine to help you breathe if your lungs fail. SEEK IMMEDIATE MEDICAL CARE IF: You get an infection or develop any of the signs and symptoms of sepsis after surgery or a hospitalization.   This information is not intended to replace advice given to you by  your health care provider. Make sure you discuss any questions you have with your health care provider.   Document Released: 12/13/2002 Document Revised: 07/31/2014 Document Reviewed: 11/21/2012 Elsevier Interactive Patient Education 2016 [employer] Inc[employer]  

## 2015-09-04 NOTE — Telephone Encounter (Signed)
Per chart, patient has ESRD on hemodialysis: She gets dialysis T/Th/Sat I added it to problem list.  This appears to be a chronic issue.  Routed.

## 2015-09-04 NOTE — Progress Notes (Signed)
Pre visit review using our clinic review tool, if applicable. No additional management support is needed unless otherwise documented below in the visit note. 

## 2015-09-04 NOTE — Assessment & Plan Note (Deleted)
Controlled on current meds Will continue to monitor

## 2015-09-04 NOTE — Telephone Encounter (Signed)
Artis Delay from [facility] lab with critical creatinine of 4.52 and GFR of 10.32.  Sent to [facility] and Dr. Damita Dunnings

## 2015-09-04 NOTE — Progress Notes (Signed)
Subjective:    Patient ID: [name], female    DOB: 07/05/1950, 65 y.o.   MRN: [id number]  HPI  Pt presents to the clinic today to follow up [facility] s/p hospitalization 07/15/15-07/23/15 for sepsis, acute on chronic congestive heart failure, chronic hypoxic respiratory failure secondary to COPD, ESRD on hemodialysis and HTN. She was dischargef from [facility] on 08/29/15. She reports she feels weak, and is having difficulty walking. She is going to be working with PT at home, but she reports they have not come yet. She is sleeping well at night with Xanax. She request a refill of the Xanax and her Norco today. Her appetite is good. She is not sure of her dry weight. Her bowels are moving okay. She urinates infrequently. Dr. Deborra Medina has already placed a referral to palliative care.   CHF, diastolic: Controlled with Amlodipine, Hydralazine and Toprol. Echo from 03/2015 reviewed, EF 60%. SShe does not have a cardiologist. She has an appt to establish care with Dr. Chancy Milroy 09/30/15.  COPD: She is on Symbicort daily. She is on continuous oxygen. She does not follow with pulmonology. She was given a RX for Albuterol nebulizer but was not given an order for a nebulizer machine, but she request one today.  ESRD on hemodialysis: She gets dialysis T/Th/Sat at through a [facility] in her right IJ. Her last creatinine was 2.2.  HTN: BP controlled on Amlodipine, Hydralazine and Toprol. Her blood pressure is 146/70.  Sepsis, right groin: She saw Dr. Ola Spurr,  ID at [facility] today. He wants to keep her on Cipro 250 mg x 3 more months.   Review of Systems      Past Medical History  Diagnosis Date  . Hyperlipidemia   . Hypertension   . Thyroid disease   . GERD (gastroesophageal reflux disease)   . Aneurysm ([place])     abdominal aortic  . STD (sexually transmitted disease)     chlamydia  . VAIN II (vaginal intraepithelial neoplasia grade II) 7/09  . Granuloma  annulare 2010    skin- sees dermatologist  . B12 deficiency   . COPD (chronic obstructive pulmonary disease) ([place])   . Renal insufficiency   . Cancer ([place])     skin on left ankle 04/10/15 pt states she has never had cancer  . ESRD (end stage renal disease) ([place])   . Diastolic heart failure ([facility])     Current Outpatient Prescriptions  Medication Sig Dispense Refill  . acetaminophen (TYLENOL) 325 MG tablet Take 2 tablets (650 mg total) by mouth every 6 (six) hours as needed for mild pain (or Fever >/= 101). 30 tablet 0  . albuterol (PROVENTIL) (2.5 MG/3ML) 0.083% nebulizer solution Take 2.5 mg by nebulization every 6 (six) hours as needed for wheezing.    Marland Kitchen ALPRAZolam (XANAX) 0.25 MG tablet Take 1 tablet (0.25 mg total) by mouth 2 (two) times daily as needed for anxiety. 20 tablet 0  . ALPRAZolam (XANAX) 0.25 MG tablet Take 1 tablet (0.25 mg total) by mouth 2 (two) times daily as needed for anxiety. 30 tablet 0  . alum & mag hydroxide-simeth (MAALOX/MYLANTA) 200-200-20 MG/5ML suspension Take 30 mLs by mouth every 6 (six) hours as needed for indigestion or heartburn (dyspepsia). 355 mL 0  . amLODipine (NORVASC) 5 MG tablet Take 1 tablet (5 mg total) by mouth daily. 30 tablet 6  . atorvastatin (LIPITOR) 40 MG tablet Take 40 mg by mouth daily.    . budesonide-formoterol (  SYMBICORT) 160-4.5 MCG/ACT inhaler Inhale 2 puffs into the lungs 2 (two) times daily.    . chlorpheniramine-HYDROcodone (TUSSIONEX) 10-8 MG/5ML SUER Take 5 mLs by mouth every 12 (twelve) hours as needed for cough. 140 mL 0  . ciprofloxacin (CIPRO) 500 MG tablet Take 1 tablet (500 mg total) by mouth at bedtime. 30 tablet 0  . Docusate Sodium 100 MG capsule Take 100 mg by mouth 2 (two) times daily.    . famotidine (PEPCID) 20 MG tablet Take 20 mg by mouth daily.    . fluticasone (FLONASE) 50 MCG/ACT nasal spray Place 2 sprays into both nostrils daily.    Marland Kitchen guaiFENesin-dextromethorphan (ROBITUSSIN DM) 100-10 MG/5ML syrup Take 10 mLs  by mouth every 4 (four) hours as needed for cough. 118 mL 0  . hydrALAZINE (APRESOLINE) 25 MG tablet Take 1 tablet (25 mg total) by mouth every 8 (eight) hours. 90 tablet 0  . HYDROcodone-acetaminophen (NORCO/VICODIN) 5-325 MG tablet Take 1-2 tablets by mouth every 6 (six) hours as needed for moderate pain or severe pain. 20 tablet 0  . HYDROcodone-acetaminophen (NORCO/VICODIN) 5-325 MG tablet Take 1-2 tablets by mouth every 6 (six) hours as needed for moderate pain or severe pain. 30 tablet 0  . levothyroxine (SYNTHROID, LEVOTHROID) 88 MCG tablet Take 88 mcg by mouth daily.    Marland Kitchen loperamide (IMODIUM A-D) 2 MG tablet Take 2 mg by mouth every 4 (four) hours as needed for diarrhea or loose stools.    . magnesium hydroxide (MILK OF MAGNESIA) 400 MG/5ML suspension Take 30 mLs by mouth daily as needed for mild constipation.    . metoprolol tartrate (LOPRESSOR) 25 MG tablet Take 1 tablet (25 mg total) by mouth every 8 (eight) hours. 90 tablet 0  . multivitamin (RENA-VIT) TABS tablet Take 1 tablet by mouth daily.    . ondansetron (ZOFRAN) 4 MG tablet Take 4 mg by mouth every 6 (six) hours as needed for nausea or vomiting.    . pantoprazole (PROTONIX) 40 MG tablet Take 1 tablet (40 mg total) by mouth 2 (two) times daily before a meal. 60 tablet 2  . sodium chloride (OCEAN) 0.65 % SOLN nasal spray Place 1 spray into both nostrils 5 (five) times daily.     No current facility-administered medications for this visit.    Allergies  Allergen Reactions  . Asa [Aspirin] Other (See Comments)    unknown  . Plavix [Clopidogrel] Other (See Comments)    unknown    Family History  Problem Relation Age of Onset  . Hypertension Other   . Hypertension Mother   . Stroke Mother   . Heart attack Father   . Cancer Sister     uterine cancer, removed ovaries  . Hypertension Sister     Social History   Social History  . Marital Status: Married    Spouse Name: N/A  . Number of Children: N/A  . Years of  Education: N/A   Occupational History  . Administrative Asst-out of work    Social History Main Topics  . Smoking status: Former Smoker -- 0.00 packs/day for 45 years    Quit date: 02/15/2015  . Smokeless tobacco: Never Used  . Alcohol Use: No  . Drug Use: No  . Sexual Activity:    Partners: Male    Birth Control/ Protection: Post-menopausal, Surgical     Comment: TAH 1990   Other Topics Concern  . Not on file   Social History Narrative   Does not have a living  will.   Desires CPR and life support if not futile.     Constitutional: Pt reports fatigue Denies fever, malaise, headache or abrupt weight changes.  HEENT: Denies eye pain, eye redness, ear pain, ringing in the ears, wax buildup, runny nose, nasal congestion, bloody nose, or sore throat. Respiratory: Pt reports shortness of breath. Denies difficulty breathing, cough or sputum production.   Cardiovascular: t reports swelling in her legs. chest pain, chest tightness, palpitations or swelling in the hands.  Gastrointestinal: Denies abdominal pain, bloating, constipation, diarrhea or blood in the stool.  GU: Denies urgency, frequency, pain with urination, burning sensation, blood in urine, odor or discharge. Musculoskeletal: Pt reports generalized weakness. Denies decrease in range of motion, difficulty with gait, or joint pain and swelling.  Skin: Denies redness, rashes, lesions or ulcercations.  Neurological: Pt reports insomnia, difficulty with balance and coordination. Denies dizziness, difficulty with memory, difficulty with speech.  Psych: Pt reports anxiety. Denies depression, SI/HI.  No other specific complaints in a complete review of systems (except as listed in HPI above).  Objective:   Physical Exam  BP 146/70 mmHg  Pulse 65  Temp(Src) 98.7 F (37.1 C) (Oral)  Wt 121 lb (54.885 kg)  SpO2 95%  LMP 03/30/1988 Wt Readings from Last 3 Encounters:  09/04/15 121 lb (54.885 kg)  07/23/15 119 lb 11.4 oz (54.3  kg)  06/23/15 136 lb 3.9 oz (61.8 kg)    General: Appears her stated age, chronically ill appearing, in NAD. Neck:  No JVD. Cardiovascular: Normal rate and rhythm. S1,S2 noted.  No murmur, rubs or gallops noted. No BLE edema.  Pulmonary/Chest: Normal effort and positive vesicular breath sounds. No respiratory distress. No wheezes, rales or ronchi noted.  Abdomen: Soft and nontender. Normal bowel sounds. Musculoskeletal: Unable to stand, in wheelchair. Neurological: Alert and oriented.  Psychiatric: She is mildly anxious appearing today  BMET    Component Value Date/Time   NA 141 09/04/2015 1326   NA 135 07/22/2014 0645   K 4.1 09/04/2015 1326   K 3.7 07/22/2014 0645   CL 98 09/04/2015 1326   CL 112* 07/22/2014 0645   CO2 33* 09/04/2015 1326   CO2 18* 07/22/2014 0645   GLUCOSE 84 09/04/2015 1326   GLUCOSE 77 07/22/2014 0645   BUN 30* 09/04/2015 1326   BUN 24* 07/22/2014 0645   CREATININE 4.52* 09/04/2015 1326   CREATININE 1.00 07/22/2014 0645   CALCIUM 9.4 09/04/2015 1326   CALCIUM 8.3* 07/22/2014 0645   GFRNONAA 22* 07/19/2015 0355   GFRNONAA 59* 07/22/2014 0645   GFRNONAA 58* 05/07/2014 1302   GFRAA 26* 07/19/2015 0355   GFRAA >60 07/22/2014 0645   GFRAA >60 05/07/2014 1302    Lipid Panel     Component Value Date/Time   CHOL 123 09/04/2015 1326   TRIG 78.0 09/04/2015 1326   HDL 59.60 09/04/2015 1326   CHOLHDL 2 09/04/2015 1326   VLDL 15.6 09/04/2015 1326   LDLCALC 48 09/04/2015 1326    CBC    Component Value Date/Time   WBC 5.0 09/04/2015 1326   WBC 9.3 07/19/2014 0841   RBC 3.53* 09/04/2015 1326   RBC 2.09* 06/15/2015 1428   RBC 3.76* 07/19/2014 0841   HGB 10.9* 09/04/2015 1326   HGB 10.8* 07/19/2014 0841   HCT 32.8* 09/04/2015 1326   HCT 33.7* 07/19/2014 0841   PLT 187.0 09/04/2015 1326   PLT 237 07/19/2014 0841   MCV 93.1 09/04/2015 1326   MCV 90 07/19/2014 0841   MCH  30.7 07/18/2015 0530   MCH 28.8 07/19/2014 0841   MCHC 33.2 09/04/2015 1326     MCHC 32.1 07/19/2014 0841   RDW 15.0 09/04/2015 1326   RDW 15.7* 07/19/2014 0841   LYMPHSABS 4.8* 07/15/2015 2307   LYMPHSABS 0.3* 07/19/2014 0841   MONOABS 1.2* 07/15/2015 2307   MONOABS 0.4 07/19/2014 0841   EOSABS 1.1* 07/15/2015 2307   EOSABS 0.0 07/19/2014 0841   BASOSABS 0.2* 07/15/2015 2307   BASOSABS 0.0 07/19/2014 0841    Hgb A1C No results found for: HGBA1C       Assessment & Plan:   Hospital/Rehab follow up for sepsis, HTN, acute on chronic CHF, COPD and ESRD on hemodialysis:  Hospital notes, labs and imaging reviewed She will work with PT She will follow up with ID and Dr. Chancy Milroy She will continue current medications at this time RX for nebulizer machine, consider referral to pulmonology Refilled Norco and Xanax today Will check CBC, CMET, TSH, T4, Lipid and Vit D (has wellness scheduled next week with Dr. Deborra Medina)  Will follow up after labs, wellness exam in 1 week

## 2015-09-05 DIAGNOSIS — N186 End stage renal disease: Secondary | ICD-10-CM | POA: Diagnosis not present

## 2015-09-05 DIAGNOSIS — Z992 Dependence on renal dialysis: Secondary | ICD-10-CM | POA: Diagnosis not present

## 2015-09-05 DIAGNOSIS — D631 Anemia in chronic kidney disease: Secondary | ICD-10-CM | POA: Diagnosis not present

## 2015-09-05 DIAGNOSIS — D509 Iron deficiency anemia, unspecified: Secondary | ICD-10-CM | POA: Diagnosis not present

## 2015-09-06 DIAGNOSIS — J449 Chronic obstructive pulmonary disease, unspecified: Secondary | ICD-10-CM | POA: Diagnosis not present

## 2015-09-06 DIAGNOSIS — I132 Hypertensive heart and chronic kidney disease with heart failure and with stage 5 chronic kidney disease, or end stage renal disease: Secondary | ICD-10-CM | POA: Diagnosis not present

## 2015-09-06 DIAGNOSIS — D631 Anemia in chronic kidney disease: Secondary | ICD-10-CM | POA: Diagnosis not present

## 2015-09-06 DIAGNOSIS — I5030 Unspecified diastolic (congestive) heart failure: Secondary | ICD-10-CM | POA: Diagnosis not present

## 2015-09-06 DIAGNOSIS — N186 End stage renal disease: Secondary | ICD-10-CM | POA: Diagnosis not present

## 2015-09-06 DIAGNOSIS — J9611 Chronic respiratory failure with hypoxia: Secondary | ICD-10-CM | POA: Diagnosis not present

## 2015-09-07 DIAGNOSIS — N186 End stage renal disease: Secondary | ICD-10-CM | POA: Diagnosis not present

## 2015-09-07 DIAGNOSIS — D631 Anemia in chronic kidney disease: Secondary | ICD-10-CM | POA: Diagnosis not present

## 2015-09-07 DIAGNOSIS — Z992 Dependence on renal dialysis: Secondary | ICD-10-CM | POA: Diagnosis not present

## 2015-09-07 DIAGNOSIS — D509 Iron deficiency anemia, unspecified: Secondary | ICD-10-CM | POA: Diagnosis not present

## 2015-09-09 ENCOUNTER — Other Ambulatory Visit: Payer: Self-pay | Admitting: Family Medicine

## 2015-09-09 DIAGNOSIS — Z01419 Encounter for gynecological examination (general) (routine) without abnormal findings: Secondary | ICD-10-CM | POA: Insufficient documentation

## 2015-09-09 DIAGNOSIS — D696 Thrombocytopenia, unspecified: Secondary | ICD-10-CM

## 2015-09-10 DIAGNOSIS — N186 End stage renal disease: Secondary | ICD-10-CM | POA: Diagnosis not present

## 2015-09-10 DIAGNOSIS — D509 Iron deficiency anemia, unspecified: Secondary | ICD-10-CM | POA: Diagnosis not present

## 2015-09-10 DIAGNOSIS — Z992 Dependence on renal dialysis: Secondary | ICD-10-CM | POA: Diagnosis not present

## 2015-09-10 DIAGNOSIS — D631 Anemia in chronic kidney disease: Secondary | ICD-10-CM | POA: Diagnosis not present

## 2015-09-11 DIAGNOSIS — I5030 Unspecified diastolic (congestive) heart failure: Secondary | ICD-10-CM | POA: Diagnosis not present

## 2015-09-11 DIAGNOSIS — N186 End stage renal disease: Secondary | ICD-10-CM | POA: Diagnosis not present

## 2015-09-11 DIAGNOSIS — J449 Chronic obstructive pulmonary disease, unspecified: Secondary | ICD-10-CM | POA: Diagnosis not present

## 2015-09-11 DIAGNOSIS — J9611 Chronic respiratory failure with hypoxia: Secondary | ICD-10-CM | POA: Diagnosis not present

## 2015-09-11 DIAGNOSIS — D631 Anemia in chronic kidney disease: Secondary | ICD-10-CM | POA: Diagnosis not present

## 2015-09-11 DIAGNOSIS — I132 Hypertensive heart and chronic kidney disease with heart failure and with stage 5 chronic kidney disease, or end stage renal disease: Secondary | ICD-10-CM | POA: Diagnosis not present

## 2015-09-12 DIAGNOSIS — D509 Iron deficiency anemia, unspecified: Secondary | ICD-10-CM | POA: Diagnosis not present

## 2015-09-12 DIAGNOSIS — D631 Anemia in chronic kidney disease: Secondary | ICD-10-CM | POA: Diagnosis not present

## 2015-09-12 DIAGNOSIS — Z992 Dependence on renal dialysis: Secondary | ICD-10-CM | POA: Diagnosis not present

## 2015-09-12 DIAGNOSIS — N186 End stage renal disease: Secondary | ICD-10-CM | POA: Diagnosis not present

## 2015-09-13 ENCOUNTER — Telehealth: Payer: Self-pay

## 2015-09-13 DIAGNOSIS — J449 Chronic obstructive pulmonary disease, unspecified: Secondary | ICD-10-CM | POA: Diagnosis not present

## 2015-09-13 DIAGNOSIS — N186 End stage renal disease: Secondary | ICD-10-CM | POA: Diagnosis not present

## 2015-09-13 DIAGNOSIS — I132 Hypertensive heart and chronic kidney disease with heart failure and with stage 5 chronic kidney disease, or end stage renal disease: Secondary | ICD-10-CM | POA: Diagnosis not present

## 2015-09-13 DIAGNOSIS — I5030 Unspecified diastolic (congestive) heart failure: Secondary | ICD-10-CM | POA: Diagnosis not present

## 2015-09-13 DIAGNOSIS — D631 Anemia in chronic kidney disease: Secondary | ICD-10-CM | POA: Diagnosis not present

## 2015-09-13 DIAGNOSIS — J9611 Chronic respiratory failure with hypoxia: Secondary | ICD-10-CM | POA: Diagnosis not present

## 2015-09-13 NOTE — Telephone Encounter (Signed)
[name] [phone]  Jamie stopped by to see if [name]ac[employer] was ready, was left at an appointment last week. I filled out a new one. Placed in RX box up front. Call when ready to pick up

## 2015-09-14 DIAGNOSIS — N186 End stage renal disease: Secondary | ICD-10-CM | POA: Diagnosis not present

## 2015-09-14 DIAGNOSIS — Z992 Dependence on renal dialysis: Secondary | ICD-10-CM | POA: Diagnosis not present

## 2015-09-14 DIAGNOSIS — D509 Iron deficiency anemia, unspecified: Secondary | ICD-10-CM | POA: Diagnosis not present

## 2015-09-14 DIAGNOSIS — D631 Anemia in chronic kidney disease: Secondary | ICD-10-CM | POA: Diagnosis not present

## 2015-09-16 NOTE — Telephone Encounter (Signed)
Signed and in [place] box.

## 2015-09-16 NOTE — Telephone Encounter (Signed)
Spoke to pt and informed her placard is available for pickup at the front desk

## 2015-09-17 DIAGNOSIS — N186 End stage renal disease: Secondary | ICD-10-CM | POA: Diagnosis not present

## 2015-09-17 DIAGNOSIS — D631 Anemia in chronic kidney disease: Secondary | ICD-10-CM | POA: Diagnosis not present

## 2015-09-17 DIAGNOSIS — D509 Iron deficiency anemia, unspecified: Secondary | ICD-10-CM | POA: Diagnosis not present

## 2015-09-17 DIAGNOSIS — Z992 Dependence on renal dialysis: Secondary | ICD-10-CM | POA: Diagnosis not present

## 2015-09-18 ENCOUNTER — Telehealth: Payer: Self-pay | Admitting: *Deleted

## 2015-09-18 ENCOUNTER — Ambulatory Visit (INDEPENDENT_AMBULATORY_CARE_PROVIDER_SITE_OTHER)
Admission: RE | Admit: 2015-09-18 | Discharge: 2015-09-18 | Disposition: A | Discharge status: Home / Self Care | Payer: Medicare Other | Source: Ambulatory Visit | Attending: Family Medicine | Admitting: Family Medicine

## 2015-09-18 ENCOUNTER — Encounter: Payer: Self-pay | Admitting: Family Medicine

## 2015-09-18 ENCOUNTER — Other Ambulatory Visit (INDEPENDENT_AMBULATORY_CARE_PROVIDER_SITE_OTHER): Payer: Medicare Other

## 2015-09-18 ENCOUNTER — Ambulatory Visit (INDEPENDENT_AMBULATORY_CARE_PROVIDER_SITE_OTHER): Payer: Medicare Other | Admitting: Family Medicine

## 2015-09-18 ENCOUNTER — Other Ambulatory Visit: Payer: Self-pay | Admitting: Family Medicine

## 2015-09-18 VITALS — BP 130/60 | HR 74 | Temp 97.7°F

## 2015-09-18 DIAGNOSIS — Z0000 Encounter for general adult medical examination without abnormal findings: Secondary | ICD-10-CM

## 2015-09-18 DIAGNOSIS — S99921A Unspecified injury of right foot, initial encounter: Secondary | ICD-10-CM

## 2015-09-18 DIAGNOSIS — S99929A Unspecified injury of unspecified foot, initial encounter: Secondary | ICD-10-CM | POA: Insufficient documentation

## 2015-09-18 DIAGNOSIS — M79671 Pain in right foot: Secondary | ICD-10-CM | POA: Diagnosis not present

## 2015-09-18 DIAGNOSIS — D696 Thrombocytopenia, unspecified: Secondary | ICD-10-CM | POA: Diagnosis not present

## 2015-09-18 DIAGNOSIS — Z01419 Encounter for gynecological examination (general) (routine) without abnormal findings: Secondary | ICD-10-CM

## 2015-09-18 LAB — LIPID PANEL
CHOL/HDL RATIO: 2
Cholesterol: 116 mg/dL (ref 0–200)
HDL: 51.6 mg/dL (ref >39.00)
LDL Cholesterol: 40 mg/dL (ref 0–99)
NONHDL: 63.99
Triglycerides: 119 mg/dL (ref 0.0–149.0)
VLDL: 23.8 mg/dL (ref 0.0–40.0)

## 2015-09-18 LAB — CBC WITH DIFFERENTIAL/PLATELET
BASOS ABS: 0 10*3/uL (ref 0.0–0.1)
Basophils Relative: 0.7 % (ref 0.0–3.0)
EOS ABS: 0.4 10*3/uL (ref 0.0–0.7)
Eosinophils Relative: 6.5 % — ABNORMAL HIGH (ref 0.0–5.0)
HEMATOCRIT: 29.4 % — AB (ref 36.0–46.0)
Hemoglobin: 9.7 g/dL — ABNORMAL LOW (ref 12.0–15.0)
LYMPHS ABS: 1 10*3/uL (ref 0.7–4.0)
Lymphocytes Relative: 18.7 % (ref 12.0–46.0)
MCHC: 33.1 g/dL (ref 30.0–36.0)
MCV: 92.4 fl (ref 78.0–100.0)
MONOS PCT: 12.4 % — AB (ref 3.0–12.0)
Monocytes Absolute: 0.7 10*3/uL (ref 0.1–1.0)
NEUTROS ABS: 3.4 10*3/uL (ref 1.4–7.7)
NEUTROS PCT: 61.7 % (ref 43.0–77.0)
PLATELETS: 281 10*3/uL (ref 150.0–400.0)
RBC: 3.19 Mil/uL — ABNORMAL LOW (ref 3.87–5.11)
RDW: 14.5 % (ref 11.5–15.5)
WBC: 5.5 10*3/uL (ref 4.0–10.5)

## 2015-09-18 LAB — COMPREHENSIVE METABOLIC PANEL
ALT: 10 U/L (ref 0–35)
AST: 19 U/L (ref 0–37)
Albumin: 3.8 g/dL (ref 3.5–5.2)
Alkaline Phosphatase: 89 U/L (ref 39–117)
BILIRUBIN TOTAL: 0.8 mg/dL (ref 0.2–1.2)
BUN: 21 mg/dL (ref 6–23)
CO2: 34 meq/L — AB (ref 19–32)
CREATININE: 3.5 mg/dL — AB (ref 0.40–1.20)
Calcium: 9.3 mg/dL (ref 8.4–10.5)
Chloride: 96 mEq/L (ref 96–112)
GFR: 13.87 mL/min — CL (ref >60.00)
GLUCOSE: 81 mg/dL (ref 70–99)
Potassium: 3.7 mEq/L (ref 3.5–5.1)
SODIUM: 140 meq/L (ref 135–145)
Total Protein: 6.8 g/dL (ref 6.0–8.3)

## 2015-09-18 LAB — TSH: TSH: 28.81 u[IU]/mL — ABNORMAL HIGH (ref 0.35–4.50)

## 2015-09-18 MED ORDER — LEVOTHYROXINE SODIUM 100 MCG PO TABS: 100.0000 ug | ORAL_TABLET | Freq: Every day | ORAL | Status: AC

## 2015-09-18 NOTE — Assessment & Plan Note (Signed)
New- with bony tenderness on exam. Right foot xray today for further evaluation. The patient indicates understanding of these issues and agrees with the plan.

## 2015-09-18 NOTE — Telephone Encounter (Signed)
Spoke to [employer] and was advised that she got the lab results and has contacted the doctor on call.

## 2015-09-18 NOTE — Patient Instructions (Signed)
Great to see you.  I will call you with your xray results. 

## 2015-09-18 NOTE — Telephone Encounter (Signed)
She has ESRD on HD.  Please make sure her nephrologist gets a copy or is alerted of this value.

## 2015-09-18 NOTE — Telephone Encounter (Signed)
Spoke to [employer] at patient's nephrologist office [phone]) and advised her of Dr. Hulen Shouts note. Was advised by Crystal that if lab work is faxed to her she will scan it into patient's chart and let the doctor on call be aware of these results.  Crystal stated that the patient is on dialysis now and patient does not have a particular doctor and the doctor's rotate taking care of her. Lab results faxed to [facility] at [phone]

## 2015-09-18 NOTE — Progress Notes (Signed)
Pre visit review using our clinic review tool, if applicable. No additional management support is needed unless otherwise documented below in the visit note. 
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
## 2015-09-18 NOTE — Telephone Encounter (Signed)
[name] from [facility] called with critical GFR on patient. GFR=13.87. Dr. Deborra Medina notified.

## 2015-09-19 DIAGNOSIS — N186 End stage renal disease: Secondary | ICD-10-CM | POA: Diagnosis not present

## 2015-09-19 DIAGNOSIS — D509 Iron deficiency anemia, unspecified: Secondary | ICD-10-CM | POA: Diagnosis not present

## 2015-09-19 DIAGNOSIS — D631 Anemia in chronic kidney disease: Secondary | ICD-10-CM | POA: Diagnosis not present

## 2015-09-19 DIAGNOSIS — Z992 Dependence on renal dialysis: Secondary | ICD-10-CM | POA: Diagnosis not present

## 2015-09-20 ENCOUNTER — Telehealth: Payer: Self-pay

## 2015-09-20 DIAGNOSIS — D631 Anemia in chronic kidney disease: Secondary | ICD-10-CM | POA: Diagnosis not present

## 2015-09-20 DIAGNOSIS — J449 Chronic obstructive pulmonary disease, unspecified: Secondary | ICD-10-CM | POA: Diagnosis not present

## 2015-09-20 DIAGNOSIS — I5030 Unspecified diastolic (congestive) heart failure: Secondary | ICD-10-CM | POA: Diagnosis not present

## 2015-09-20 DIAGNOSIS — J9611 Chronic respiratory failure with hypoxia: Secondary | ICD-10-CM | POA: Diagnosis not present

## 2015-09-20 DIAGNOSIS — N186 End stage renal disease: Secondary | ICD-10-CM | POA: Diagnosis not present

## 2015-09-20 DIAGNOSIS — I132 Hypertensive heart and chronic kidney disease with heart failure and with stage 5 chronic kidney disease, or end stage renal disease: Secondary | ICD-10-CM | POA: Diagnosis not present

## 2015-09-20 NOTE — Telephone Encounter (Signed)
[name] PT with [facility] left v/m requesting verbal orders for home health PT 1 x a week for 1 weeks and 2  x a week for 4 weeks; pt had strained ankle today pt walked about 200 ft today and will continue standing and walking tolerance.

## 2015-09-21 DIAGNOSIS — N186 End stage renal disease: Secondary | ICD-10-CM | POA: Diagnosis not present

## 2015-09-21 DIAGNOSIS — D509 Iron deficiency anemia, unspecified: Secondary | ICD-10-CM | POA: Diagnosis not present

## 2015-09-21 DIAGNOSIS — D631 Anemia in chronic kidney disease: Secondary | ICD-10-CM | POA: Diagnosis not present

## 2015-09-21 DIAGNOSIS — Z992 Dependence on renal dialysis: Secondary | ICD-10-CM | POA: Diagnosis not present

## 2015-09-23 ENCOUNTER — Encounter: Payer: Self-pay | Admitting: Family Medicine

## 2015-09-23 ENCOUNTER — Ambulatory Visit (INDEPENDENT_AMBULATORY_CARE_PROVIDER_SITE_OTHER): Payer: Medicare Other | Admitting: Family Medicine

## 2015-09-23 VITALS — BP 124/60 | HR 64 | Temp 97.7°F | Ht 64.5 in

## 2015-09-23 DIAGNOSIS — I713 Abdominal aortic aneurysm, ruptured, unspecified: Secondary | ICD-10-CM

## 2015-09-23 DIAGNOSIS — S99921D Unspecified injury of right foot, subsequent encounter: Secondary | ICD-10-CM

## 2015-09-23 DIAGNOSIS — Z992 Dependence on renal dialysis: Secondary | ICD-10-CM | POA: Diagnosis not present

## 2015-09-23 DIAGNOSIS — J449 Chronic obstructive pulmonary disease, unspecified: Secondary | ICD-10-CM | POA: Diagnosis not present

## 2015-09-23 DIAGNOSIS — I132 Hypertensive heart and chronic kidney disease with heart failure and with stage 5 chronic kidney disease, or end stage renal disease: Secondary | ICD-10-CM | POA: Diagnosis not present

## 2015-09-23 DIAGNOSIS — N186 End stage renal disease: Secondary | ICD-10-CM | POA: Diagnosis not present

## 2015-09-23 DIAGNOSIS — I5030 Unspecified diastolic (congestive) heart failure: Secondary | ICD-10-CM | POA: Diagnosis not present

## 2015-09-23 DIAGNOSIS — D631 Anemia in chronic kidney disease: Secondary | ICD-10-CM | POA: Diagnosis not present

## 2015-09-23 DIAGNOSIS — J9611 Chronic respiratory failure with hypoxia: Secondary | ICD-10-CM | POA: Diagnosis not present

## 2015-09-23 NOTE — Telephone Encounter (Signed)
Ok to give verbal orders as requested. 

## 2015-09-23 NOTE — Telephone Encounter (Signed)
Lm on [name]'s vm and provided verbal orders
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
## 2015-09-24 DIAGNOSIS — J449 Chronic obstructive pulmonary disease, unspecified: Secondary | ICD-10-CM | POA: Diagnosis not present

## 2015-09-24 DIAGNOSIS — Z992 Dependence on renal dialysis: Secondary | ICD-10-CM | POA: Diagnosis not present

## 2015-09-24 DIAGNOSIS — N186 End stage renal disease: Secondary | ICD-10-CM | POA: Diagnosis not present

## 2015-09-24 DIAGNOSIS — I132 Hypertensive heart and chronic kidney disease with heart failure and with stage 5 chronic kidney disease, or end stage renal disease: Secondary | ICD-10-CM | POA: Diagnosis not present

## 2015-09-24 DIAGNOSIS — D631 Anemia in chronic kidney disease: Secondary | ICD-10-CM | POA: Diagnosis not present

## 2015-09-24 DIAGNOSIS — D509 Iron deficiency anemia, unspecified: Secondary | ICD-10-CM | POA: Diagnosis not present

## 2015-09-24 DIAGNOSIS — I5030 Unspecified diastolic (congestive) heart failure: Secondary | ICD-10-CM | POA: Diagnosis not present

## 2015-09-25 ENCOUNTER — Ambulatory Visit (INDEPENDENT_AMBULATORY_CARE_PROVIDER_SITE_OTHER): Payer: Medicare Other | Admitting: Family Medicine

## 2015-09-25 ENCOUNTER — Encounter: Payer: Self-pay | Admitting: Family Medicine

## 2015-09-25 VITALS — BP 132/74 | HR 62 | Temp 97.5°F | Ht 64.75 in | Wt 111.8 lb

## 2015-09-25 DIAGNOSIS — Z0000 Encounter for general adult medical examination without abnormal findings: Secondary | ICD-10-CM | POA: Diagnosis not present

## 2015-09-25 DIAGNOSIS — I10 Essential (primary) hypertension: Secondary | ICD-10-CM

## 2015-09-25 DIAGNOSIS — E038 Other specified hypothyroidism: Secondary | ICD-10-CM

## 2015-09-25 DIAGNOSIS — I714 Abdominal aortic aneurysm, without rupture, unspecified: Secondary | ICD-10-CM

## 2015-09-25 DIAGNOSIS — N186 End stage renal disease: Secondary | ICD-10-CM

## 2015-09-25 DIAGNOSIS — J438 Other emphysema: Secondary | ICD-10-CM | POA: Diagnosis not present

## 2015-09-25 DIAGNOSIS — Z23 Encounter for immunization: Secondary | ICD-10-CM

## 2015-09-25 DIAGNOSIS — E785 Hyperlipidemia, unspecified: Secondary | ICD-10-CM

## 2015-09-25 DIAGNOSIS — J449 Chronic obstructive pulmonary disease, unspecified: Secondary | ICD-10-CM | POA: Diagnosis not present

## 2015-09-25 DIAGNOSIS — I5030 Unspecified diastolic (congestive) heart failure: Secondary | ICD-10-CM | POA: Diagnosis not present

## 2015-09-25 DIAGNOSIS — Z992 Dependence on renal dialysis: Secondary | ICD-10-CM | POA: Diagnosis not present

## 2015-09-25 DIAGNOSIS — F17200 Nicotine dependence, unspecified, uncomplicated: Secondary | ICD-10-CM

## 2015-09-25 DIAGNOSIS — D631 Anemia in chronic kidney disease: Secondary | ICD-10-CM | POA: Diagnosis not present

## 2015-09-25 DIAGNOSIS — J9611 Chronic respiratory failure with hypoxia: Secondary | ICD-10-CM | POA: Diagnosis not present

## 2015-09-25 DIAGNOSIS — I132 Hypertensive heart and chronic kidney disease with heart failure and with stage 5 chronic kidney disease, or end stage renal disease: Secondary | ICD-10-CM | POA: Diagnosis not present

## 2015-09-25 DIAGNOSIS — A021 Salmonella sepsis: Secondary | ICD-10-CM

## 2015-09-25 DIAGNOSIS — I5031 Acute diastolic (congestive) heart failure: Secondary | ICD-10-CM

## 2015-09-25 NOTE — Assessment & Plan Note (Signed)
On HD 

## 2015-09-25 NOTE — Assessment & Plan Note (Addendum)
The patients weight, height, BMI and visual acuity have been recorded in the chart.  Cognitive function assessed.   I have made referrals, counseling and provided education to the patient based review of the above and I have provided the pt with a written personalized care plan for preventive services.  Pneumovax today. 

## 2015-09-25 NOTE — Assessment & Plan Note (Signed)
Good control. No changes made.

## 2015-09-25 NOTE — Assessment & Plan Note (Signed)
TSH elevated this month, synthroid dose recently increased. Plan on recheck ing thyroid pain in 2 weeks. The patient indicates understanding of these issues and agrees with the plan.

## 2015-09-25 NOTE — Addendum Note (Signed)
Addended by: Modena Nunnery on: 09/25/2015 08:59 AM   Modules accepted: Orders, SmartSet
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
## 2015-09-25 NOTE — Assessment & Plan Note (Signed)
Continue current inhalers. No changes made.

## 2015-09-25 NOTE — Patient Instructions (Addendum)
Great to see you. Please keep your lab appointment to recheck your thyroid labs.

## 2015-09-25 NOTE — Assessment & Plan Note (Signed)
Keep appt to establish care with cardiology next week. No changes made to rxs today.

## 2015-09-26 DIAGNOSIS — Z992 Dependence on renal dialysis: Secondary | ICD-10-CM | POA: Diagnosis not present

## 2015-09-26 DIAGNOSIS — N186 End stage renal disease: Secondary | ICD-10-CM | POA: Diagnosis not present

## 2015-09-26 DIAGNOSIS — D509 Iron deficiency anemia, unspecified: Secondary | ICD-10-CM | POA: Diagnosis not present

## 2015-09-26 DIAGNOSIS — D631 Anemia in chronic kidney disease: Secondary | ICD-10-CM | POA: Diagnosis not present

## 2015-09-27 DIAGNOSIS — Z992 Dependence on renal dialysis: Secondary | ICD-10-CM | POA: Diagnosis not present

## 2015-09-27 DIAGNOSIS — D631 Anemia in chronic kidney disease: Secondary | ICD-10-CM | POA: Diagnosis not present

## 2015-09-27 DIAGNOSIS — I5030 Unspecified diastolic (congestive) heart failure: Secondary | ICD-10-CM | POA: Diagnosis not present

## 2015-09-27 DIAGNOSIS — N186 End stage renal disease: Secondary | ICD-10-CM | POA: Diagnosis not present

## 2015-09-27 DIAGNOSIS — J9611 Chronic respiratory failure with hypoxia: Secondary | ICD-10-CM | POA: Diagnosis not present

## 2015-09-27 DIAGNOSIS — I132 Hypertensive heart and chronic kidney disease with heart failure and with stage 5 chronic kidney disease, or end stage renal disease: Secondary | ICD-10-CM | POA: Diagnosis not present

## 2015-09-27 DIAGNOSIS — J449 Chronic obstructive pulmonary disease, unspecified: Secondary | ICD-10-CM | POA: Diagnosis not present

## 2015-09-28 DIAGNOSIS — Z992 Dependence on renal dialysis: Secondary | ICD-10-CM | POA: Diagnosis not present

## 2015-09-28 DIAGNOSIS — N186 End stage renal disease: Secondary | ICD-10-CM | POA: Diagnosis not present

## 2015-09-28 DIAGNOSIS — D631 Anemia in chronic kidney disease: Secondary | ICD-10-CM | POA: Diagnosis not present

## 2015-09-28 DIAGNOSIS — D509 Iron deficiency anemia, unspecified: Secondary | ICD-10-CM | POA: Diagnosis not present

## 2015-09-28 DIAGNOSIS — N2581 Secondary hyperparathyroidism of renal origin: Secondary | ICD-10-CM | POA: Diagnosis not present

## 2015-09-30 ENCOUNTER — Other Ambulatory Visit: Payer: Self-pay

## 2015-09-30 DIAGNOSIS — J449 Chronic obstructive pulmonary disease, unspecified: Secondary | ICD-10-CM | POA: Diagnosis not present

## 2015-09-30 DIAGNOSIS — R0602 Shortness of breath: Secondary | ICD-10-CM | POA: Diagnosis not present

## 2015-09-30 DIAGNOSIS — I713 Abdominal aortic aneurysm, ruptured: Secondary | ICD-10-CM | POA: Diagnosis not present

## 2015-09-30 NOTE — Telephone Encounter (Signed)
Pt left v/m; pt has appt today with heart doctor; pt will be home after lunch; pt requesting refills on amlodipine and atorvastatin; pt no longer seeing Dr Tamala Julian; pt is on dialysis and note on med list for atorvastatin to hold statin while taking Daptomycin. Please advise. Total care. Last annual exam on 09/25/15.

## 2015-09-30 NOTE — Telephone Encounter (Signed)
Cardiologist needs to take over these rxs now with new complications.  I'm glad she has an appointment this afternoon.  Please send requests to cardiology, Dr. Chancy Milroy.

## 2015-10-01 DIAGNOSIS — Z992 Dependence on renal dialysis: Secondary | ICD-10-CM | POA: Diagnosis not present

## 2015-10-01 DIAGNOSIS — N186 End stage renal disease: Secondary | ICD-10-CM | POA: Diagnosis not present

## 2015-10-01 DIAGNOSIS — D509 Iron deficiency anemia, unspecified: Secondary | ICD-10-CM | POA: Diagnosis not present

## 2015-10-01 DIAGNOSIS — D631 Anemia in chronic kidney disease: Secondary | ICD-10-CM | POA: Diagnosis not present

## 2015-10-01 DIAGNOSIS — N2581 Secondary hyperparathyroidism of renal origin: Secondary | ICD-10-CM | POA: Diagnosis not present

## 2015-10-02 DIAGNOSIS — I132 Hypertensive heart and chronic kidney disease with heart failure and with stage 5 chronic kidney disease, or end stage renal disease: Secondary | ICD-10-CM | POA: Diagnosis not present

## 2015-10-02 DIAGNOSIS — N186 End stage renal disease: Secondary | ICD-10-CM | POA: Diagnosis not present

## 2015-10-02 DIAGNOSIS — J449 Chronic obstructive pulmonary disease, unspecified: Secondary | ICD-10-CM | POA: Diagnosis not present

## 2015-10-02 DIAGNOSIS — D631 Anemia in chronic kidney disease: Secondary | ICD-10-CM | POA: Diagnosis not present

## 2015-10-02 DIAGNOSIS — I5030 Unspecified diastolic (congestive) heart failure: Secondary | ICD-10-CM | POA: Diagnosis not present

## 2015-10-02 DIAGNOSIS — J9611 Chronic respiratory failure with hypoxia: Secondary | ICD-10-CM | POA: Diagnosis not present

## 2015-10-03 DIAGNOSIS — Z992 Dependence on renal dialysis: Secondary | ICD-10-CM | POA: Diagnosis not present

## 2015-10-03 DIAGNOSIS — N2581 Secondary hyperparathyroidism of renal origin: Secondary | ICD-10-CM | POA: Diagnosis not present

## 2015-10-03 DIAGNOSIS — D631 Anemia in chronic kidney disease: Secondary | ICD-10-CM | POA: Diagnosis not present

## 2015-10-03 DIAGNOSIS — N186 End stage renal disease: Secondary | ICD-10-CM | POA: Diagnosis not present

## 2015-10-03 DIAGNOSIS — D509 Iron deficiency anemia, unspecified: Secondary | ICD-10-CM | POA: Diagnosis not present

## 2015-10-04 ENCOUNTER — Encounter: Payer: Self-pay | Admitting: Family Medicine

## 2015-10-04 ENCOUNTER — Telehealth: Payer: Self-pay

## 2015-10-04 DIAGNOSIS — Z992 Dependence on renal dialysis: Principal | ICD-10-CM

## 2015-10-04 DIAGNOSIS — N186 End stage renal disease: Secondary | ICD-10-CM

## 2015-10-04 MED ORDER — HYDROCODONE-ACETAMINOPHEN 5-325 MG PO TABS: 1.0000 | ORAL_TABLET | Freq: Four times a day (QID) | ORAL | Status: DC | PRN

## 2015-10-04 NOTE — Telephone Encounter (Signed)
[name] notified prescription is ready to be picked up at the front desk.

## 2015-10-04 NOTE — Telephone Encounter (Signed)
Will refill small amount.. For further refills need to await return of PCP.

## 2015-10-04 NOTE — Telephone Encounter (Signed)
Pt left v/m requesting rx hydrocodone apap. Call when ready for pick up. Pt is out of med and request to pick up rx on 10/04/15. [name] out of office and will send to doctor in office.pt last seen annual 09/24/14; no future appt scheduled. Last printed # 20 on 09/04/15.

## 2015-10-05 DIAGNOSIS — N186 End stage renal disease: Secondary | ICD-10-CM | POA: Diagnosis not present

## 2015-10-05 DIAGNOSIS — Z992 Dependence on renal dialysis: Secondary | ICD-10-CM | POA: Diagnosis not present

## 2015-10-05 DIAGNOSIS — D509 Iron deficiency anemia, unspecified: Secondary | ICD-10-CM | POA: Diagnosis not present

## 2015-10-05 DIAGNOSIS — D631 Anemia in chronic kidney disease: Secondary | ICD-10-CM | POA: Diagnosis not present

## 2015-10-05 DIAGNOSIS — N2581 Secondary hyperparathyroidism of renal origin: Secondary | ICD-10-CM | POA: Diagnosis not present

## 2015-10-07 ENCOUNTER — Other Ambulatory Visit: Payer: Self-pay | Admitting: Family Medicine

## 2015-10-07 ENCOUNTER — Encounter: Payer: Self-pay | Admitting: Family Medicine

## 2015-10-07 DIAGNOSIS — N186 End stage renal disease: Secondary | ICD-10-CM | POA: Diagnosis not present

## 2015-10-07 DIAGNOSIS — J9611 Chronic respiratory failure with hypoxia: Secondary | ICD-10-CM | POA: Diagnosis not present

## 2015-10-07 DIAGNOSIS — I132 Hypertensive heart and chronic kidney disease with heart failure and with stage 5 chronic kidney disease, or end stage renal disease: Secondary | ICD-10-CM | POA: Diagnosis not present

## 2015-10-07 DIAGNOSIS — J449 Chronic obstructive pulmonary disease, unspecified: Secondary | ICD-10-CM | POA: Diagnosis not present

## 2015-10-07 DIAGNOSIS — I5030 Unspecified diastolic (congestive) heart failure: Secondary | ICD-10-CM | POA: Diagnosis not present

## 2015-10-07 DIAGNOSIS — D631 Anemia in chronic kidney disease: Secondary | ICD-10-CM | POA: Diagnosis not present

## 2015-10-07 MED ORDER — TRAMADOL HCL 50 MG PO TABS: 50.0000 mg | ORAL_TABLET | Freq: Three times a day (TID) | ORAL | Status: DC | PRN

## 2015-10-07 NOTE — Telephone Encounter (Signed)
Both are controlled substances but given the severity of her illness, I do agree that rx for Tramadol is appropriate. Rx printed.

## 2015-10-07 NOTE — Telephone Encounter (Addendum)
Pt left v/m; pt was told on 10/04/15 needs to come in for urine screening and pt cannot give specimen due to kidney issues. Pt is having a lot of back pain and request pain med. If pt cannot get hydrocodone; pt request Tramadol to [facility]. Pt request cb.

## 2015-10-07 NOTE — Addendum Note (Signed)
Addended by: Lucille Passy on: 10/07/2015 09:38 AM   Modules accepted: Orders

## 2015-10-07 NOTE — Telephone Encounter (Signed)
Rx called in to requested pharmacy. Rx at front desk retrieved and shred

## 2015-10-08 DIAGNOSIS — D631 Anemia in chronic kidney disease: Secondary | ICD-10-CM | POA: Diagnosis not present

## 2015-10-08 DIAGNOSIS — D509 Iron deficiency anemia, unspecified: Secondary | ICD-10-CM | POA: Diagnosis not present

## 2015-10-08 DIAGNOSIS — Z992 Dependence on renal dialysis: Secondary | ICD-10-CM | POA: Diagnosis not present

## 2015-10-08 DIAGNOSIS — N2581 Secondary hyperparathyroidism of renal origin: Secondary | ICD-10-CM | POA: Diagnosis not present

## 2015-10-08 DIAGNOSIS — N186 End stage renal disease: Secondary | ICD-10-CM | POA: Diagnosis not present

## 2015-10-09 DIAGNOSIS — I5030 Unspecified diastolic (congestive) heart failure: Secondary | ICD-10-CM | POA: Diagnosis not present

## 2015-10-09 DIAGNOSIS — N186 End stage renal disease: Secondary | ICD-10-CM | POA: Diagnosis not present

## 2015-10-09 DIAGNOSIS — J449 Chronic obstructive pulmonary disease, unspecified: Secondary | ICD-10-CM | POA: Diagnosis not present

## 2015-10-09 DIAGNOSIS — D631 Anemia in chronic kidney disease: Secondary | ICD-10-CM | POA: Diagnosis not present

## 2015-10-09 DIAGNOSIS — J9611 Chronic respiratory failure with hypoxia: Secondary | ICD-10-CM | POA: Diagnosis not present

## 2015-10-09 DIAGNOSIS — I132 Hypertensive heart and chronic kidney disease with heart failure and with stage 5 chronic kidney disease, or end stage renal disease: Secondary | ICD-10-CM | POA: Diagnosis not present

## 2015-10-10 DIAGNOSIS — D509 Iron deficiency anemia, unspecified: Secondary | ICD-10-CM | POA: Diagnosis not present

## 2015-10-10 DIAGNOSIS — N2581 Secondary hyperparathyroidism of renal origin: Secondary | ICD-10-CM | POA: Diagnosis not present

## 2015-10-10 DIAGNOSIS — N186 End stage renal disease: Secondary | ICD-10-CM | POA: Diagnosis not present

## 2015-10-10 DIAGNOSIS — Z992 Dependence on renal dialysis: Secondary | ICD-10-CM | POA: Diagnosis not present

## 2015-10-10 DIAGNOSIS — D631 Anemia in chronic kidney disease: Secondary | ICD-10-CM | POA: Diagnosis not present

## 2015-10-11 MED ORDER — ATORVASTATIN CALCIUM 40 MG PO TABS: 40.0000 mg | ORAL_TABLET | Freq: Every day | ORAL | Status: DC

## 2015-10-11 MED ORDER — AMLODIPINE BESYLATE 5 MG PO TABS: 5.0000 mg | ORAL_TABLET | Freq: Every day | ORAL | Status: DC

## 2015-10-12 DIAGNOSIS — N2581 Secondary hyperparathyroidism of renal origin: Secondary | ICD-10-CM | POA: Diagnosis not present

## 2015-10-12 DIAGNOSIS — D631 Anemia in chronic kidney disease: Secondary | ICD-10-CM | POA: Diagnosis not present

## 2015-10-12 DIAGNOSIS — N186 End stage renal disease: Secondary | ICD-10-CM | POA: Diagnosis not present

## 2015-10-12 DIAGNOSIS — D509 Iron deficiency anemia, unspecified: Secondary | ICD-10-CM | POA: Diagnosis not present

## 2015-10-12 DIAGNOSIS — Z992 Dependence on renal dialysis: Secondary | ICD-10-CM | POA: Diagnosis not present

## 2015-10-14 ENCOUNTER — Other Ambulatory Visit: Payer: Self-pay | Admitting: *Deleted

## 2015-10-14 DIAGNOSIS — I132 Hypertensive heart and chronic kidney disease with heart failure and with stage 5 chronic kidney disease, or end stage renal disease: Secondary | ICD-10-CM | POA: Diagnosis not present

## 2015-10-14 DIAGNOSIS — G4733 Obstructive sleep apnea (adult) (pediatric): Secondary | ICD-10-CM | POA: Diagnosis not present

## 2015-10-14 DIAGNOSIS — J449 Chronic obstructive pulmonary disease, unspecified: Secondary | ICD-10-CM | POA: Diagnosis not present

## 2015-10-14 DIAGNOSIS — J9611 Chronic respiratory failure with hypoxia: Secondary | ICD-10-CM | POA: Diagnosis not present

## 2015-10-14 DIAGNOSIS — I5022 Chronic systolic (congestive) heart failure: Secondary | ICD-10-CM | POA: Diagnosis not present

## 2015-10-14 DIAGNOSIS — N186 End stage renal disease: Secondary | ICD-10-CM | POA: Diagnosis not present

## 2015-10-14 DIAGNOSIS — D631 Anemia in chronic kidney disease: Secondary | ICD-10-CM | POA: Diagnosis not present

## 2015-10-14 DIAGNOSIS — I5030 Unspecified diastolic (congestive) heart failure: Secondary | ICD-10-CM | POA: Diagnosis not present

## 2015-10-14 MED ORDER — RENA-VITE PO TABS: 1.0000 | ORAL_TABLET | Freq: Every day | ORAL | Status: DC

## 2015-10-15 ENCOUNTER — Other Ambulatory Visit: Payer: Self-pay

## 2015-10-15 DIAGNOSIS — N186 End stage renal disease: Secondary | ICD-10-CM

## 2015-10-15 DIAGNOSIS — Z992 Dependence on renal dialysis: Secondary | ICD-10-CM | POA: Diagnosis not present

## 2015-10-15 DIAGNOSIS — D509 Iron deficiency anemia, unspecified: Secondary | ICD-10-CM | POA: Diagnosis not present

## 2015-10-15 DIAGNOSIS — D631 Anemia in chronic kidney disease: Secondary | ICD-10-CM | POA: Diagnosis not present

## 2015-10-15 DIAGNOSIS — N2581 Secondary hyperparathyroidism of renal origin: Secondary | ICD-10-CM | POA: Diagnosis not present

## 2015-10-15 NOTE — Telephone Encounter (Signed)
Last filled 09/04/15--please advise 

## 2015-10-16 DIAGNOSIS — J9611 Chronic respiratory failure with hypoxia: Secondary | ICD-10-CM | POA: Diagnosis not present

## 2015-10-16 DIAGNOSIS — I132 Hypertensive heart and chronic kidney disease with heart failure and with stage 5 chronic kidney disease, or end stage renal disease: Secondary | ICD-10-CM | POA: Diagnosis not present

## 2015-10-16 DIAGNOSIS — I5030 Unspecified diastolic (congestive) heart failure: Secondary | ICD-10-CM | POA: Diagnosis not present

## 2015-10-16 DIAGNOSIS — J449 Chronic obstructive pulmonary disease, unspecified: Secondary | ICD-10-CM | POA: Diagnosis not present

## 2015-10-16 DIAGNOSIS — N186 End stage renal disease: Secondary | ICD-10-CM | POA: Diagnosis not present

## 2015-10-16 DIAGNOSIS — D631 Anemia in chronic kidney disease: Secondary | ICD-10-CM | POA: Diagnosis not present

## 2015-10-16 DIAGNOSIS — R0602 Shortness of breath: Secondary | ICD-10-CM | POA: Diagnosis not present

## 2015-10-16 MED ORDER — ALPRAZOLAM 0.25 MG PO TABS: 0.2500 mg | ORAL_TABLET | Freq: Two times a day (BID) | ORAL | Status: DC | PRN

## 2015-10-16 NOTE — Telephone Encounter (Signed)
Rx called in to pharmacy. 

## 2015-10-17 ENCOUNTER — Telehealth: Payer: Self-pay | Admitting: Family Medicine

## 2015-10-17 DIAGNOSIS — D509 Iron deficiency anemia, unspecified: Secondary | ICD-10-CM | POA: Diagnosis not present

## 2015-10-17 DIAGNOSIS — N2581 Secondary hyperparathyroidism of renal origin: Secondary | ICD-10-CM | POA: Diagnosis not present

## 2015-10-17 DIAGNOSIS — D631 Anemia in chronic kidney disease: Secondary | ICD-10-CM | POA: Diagnosis not present

## 2015-10-17 DIAGNOSIS — N186 End stage renal disease: Secondary | ICD-10-CM | POA: Diagnosis not present

## 2015-10-17 DIAGNOSIS — Z992 Dependence on renal dialysis: Secondary | ICD-10-CM | POA: Diagnosis not present

## 2015-10-17 NOTE — Telephone Encounter (Signed)
Ok to give verbal orders as requested. 

## 2015-10-17 NOTE — Telephone Encounter (Signed)
Aldrin called needing to get a verbal order Continue home health PT to work on strength and balance 2 x weekly for 4 weeks

## 2015-10-17 NOTE — Telephone Encounter (Signed)
Spoke to [employer] and provided verbal orders

## 2015-10-19 DIAGNOSIS — N2581 Secondary hyperparathyroidism of renal origin: Secondary | ICD-10-CM | POA: Diagnosis not present

## 2015-10-19 DIAGNOSIS — N186 End stage renal disease: Secondary | ICD-10-CM | POA: Diagnosis not present

## 2015-10-19 DIAGNOSIS — D509 Iron deficiency anemia, unspecified: Secondary | ICD-10-CM | POA: Diagnosis not present

## 2015-10-19 DIAGNOSIS — D631 Anemia in chronic kidney disease: Secondary | ICD-10-CM | POA: Diagnosis not present

## 2015-10-19 DIAGNOSIS — Z992 Dependence on renal dialysis: Secondary | ICD-10-CM | POA: Diagnosis not present

## 2015-10-21 DIAGNOSIS — I132 Hypertensive heart and chronic kidney disease with heart failure and with stage 5 chronic kidney disease, or end stage renal disease: Secondary | ICD-10-CM | POA: Diagnosis not present

## 2015-10-21 DIAGNOSIS — N186 End stage renal disease: Secondary | ICD-10-CM | POA: Diagnosis not present

## 2015-10-21 DIAGNOSIS — J449 Chronic obstructive pulmonary disease, unspecified: Secondary | ICD-10-CM | POA: Diagnosis not present

## 2015-10-21 DIAGNOSIS — I5030 Unspecified diastolic (congestive) heart failure: Secondary | ICD-10-CM | POA: Diagnosis not present

## 2015-10-21 DIAGNOSIS — J9611 Chronic respiratory failure with hypoxia: Secondary | ICD-10-CM | POA: Diagnosis not present

## 2015-10-21 DIAGNOSIS — D631 Anemia in chronic kidney disease: Secondary | ICD-10-CM | POA: Diagnosis not present

## 2015-10-22 DIAGNOSIS — D631 Anemia in chronic kidney disease: Secondary | ICD-10-CM | POA: Diagnosis not present

## 2015-10-22 DIAGNOSIS — D509 Iron deficiency anemia, unspecified: Secondary | ICD-10-CM | POA: Diagnosis not present

## 2015-10-22 DIAGNOSIS — N186 End stage renal disease: Secondary | ICD-10-CM | POA: Diagnosis not present

## 2015-10-22 DIAGNOSIS — Z992 Dependence on renal dialysis: Secondary | ICD-10-CM | POA: Diagnosis not present

## 2015-10-22 DIAGNOSIS — N2581 Secondary hyperparathyroidism of renal origin: Secondary | ICD-10-CM | POA: Diagnosis not present

## 2015-10-23 DIAGNOSIS — R0602 Shortness of breath: Secondary | ICD-10-CM | POA: Diagnosis not present

## 2015-10-24 DIAGNOSIS — D509 Iron deficiency anemia, unspecified: Secondary | ICD-10-CM | POA: Diagnosis not present

## 2015-10-24 DIAGNOSIS — N2581 Secondary hyperparathyroidism of renal origin: Secondary | ICD-10-CM | POA: Diagnosis not present

## 2015-10-24 DIAGNOSIS — D631 Anemia in chronic kidney disease: Secondary | ICD-10-CM | POA: Diagnosis not present

## 2015-10-24 DIAGNOSIS — N186 End stage renal disease: Secondary | ICD-10-CM | POA: Diagnosis not present

## 2015-10-24 DIAGNOSIS — Z992 Dependence on renal dialysis: Secondary | ICD-10-CM | POA: Diagnosis not present

## 2015-10-25 DIAGNOSIS — I5030 Unspecified diastolic (congestive) heart failure: Secondary | ICD-10-CM | POA: Diagnosis not present

## 2015-10-25 DIAGNOSIS — I132 Hypertensive heart and chronic kidney disease with heart failure and with stage 5 chronic kidney disease, or end stage renal disease: Secondary | ICD-10-CM | POA: Diagnosis not present

## 2015-10-25 DIAGNOSIS — J449 Chronic obstructive pulmonary disease, unspecified: Secondary | ICD-10-CM | POA: Diagnosis not present

## 2015-10-25 DIAGNOSIS — J9611 Chronic respiratory failure with hypoxia: Secondary | ICD-10-CM | POA: Diagnosis not present

## 2015-10-25 DIAGNOSIS — N186 End stage renal disease: Secondary | ICD-10-CM | POA: Diagnosis not present

## 2015-10-25 DIAGNOSIS — D631 Anemia in chronic kidney disease: Secondary | ICD-10-CM | POA: Diagnosis not present

## 2015-10-26 DIAGNOSIS — N186 End stage renal disease: Secondary | ICD-10-CM | POA: Diagnosis not present

## 2015-10-26 DIAGNOSIS — D509 Iron deficiency anemia, unspecified: Secondary | ICD-10-CM | POA: Diagnosis not present

## 2015-10-26 DIAGNOSIS — N2581 Secondary hyperparathyroidism of renal origin: Secondary | ICD-10-CM | POA: Diagnosis not present

## 2015-10-26 DIAGNOSIS — D631 Anemia in chronic kidney disease: Secondary | ICD-10-CM | POA: Diagnosis not present

## 2015-10-26 DIAGNOSIS — Z992 Dependence on renal dialysis: Secondary | ICD-10-CM | POA: Diagnosis not present

## 2015-10-28 DIAGNOSIS — J449 Chronic obstructive pulmonary disease, unspecified: Secondary | ICD-10-CM | POA: Diagnosis not present

## 2015-10-28 DIAGNOSIS — I132 Hypertensive heart and chronic kidney disease with heart failure and with stage 5 chronic kidney disease, or end stage renal disease: Secondary | ICD-10-CM | POA: Diagnosis not present

## 2015-10-28 DIAGNOSIS — D631 Anemia in chronic kidney disease: Secondary | ICD-10-CM | POA: Diagnosis not present

## 2015-10-28 DIAGNOSIS — J9611 Chronic respiratory failure with hypoxia: Secondary | ICD-10-CM | POA: Diagnosis not present

## 2015-10-28 DIAGNOSIS — I5030 Unspecified diastolic (congestive) heart failure: Secondary | ICD-10-CM | POA: Diagnosis not present

## 2015-10-28 DIAGNOSIS — Z992 Dependence on renal dialysis: Secondary | ICD-10-CM | POA: Diagnosis not present

## 2015-10-28 DIAGNOSIS — N186 End stage renal disease: Secondary | ICD-10-CM | POA: Diagnosis not present

## 2015-10-29 DIAGNOSIS — D509 Iron deficiency anemia, unspecified: Secondary | ICD-10-CM | POA: Diagnosis not present

## 2015-10-29 DIAGNOSIS — N186 End stage renal disease: Secondary | ICD-10-CM | POA: Diagnosis not present

## 2015-10-29 DIAGNOSIS — Z992 Dependence on renal dialysis: Secondary | ICD-10-CM | POA: Diagnosis not present

## 2015-10-29 DIAGNOSIS — D631 Anemia in chronic kidney disease: Secondary | ICD-10-CM | POA: Diagnosis not present

## 2015-10-30 DIAGNOSIS — I132 Hypertensive heart and chronic kidney disease with heart failure and with stage 5 chronic kidney disease, or end stage renal disease: Secondary | ICD-10-CM | POA: Diagnosis not present

## 2015-10-30 DIAGNOSIS — I5030 Unspecified diastolic (congestive) heart failure: Secondary | ICD-10-CM | POA: Diagnosis not present

## 2015-10-30 DIAGNOSIS — J9611 Chronic respiratory failure with hypoxia: Secondary | ICD-10-CM | POA: Diagnosis not present

## 2015-10-30 DIAGNOSIS — N186 End stage renal disease: Secondary | ICD-10-CM | POA: Diagnosis not present

## 2015-10-30 DIAGNOSIS — J449 Chronic obstructive pulmonary disease, unspecified: Secondary | ICD-10-CM | POA: Diagnosis not present

## 2015-10-30 DIAGNOSIS — D631 Anemia in chronic kidney disease: Secondary | ICD-10-CM | POA: Diagnosis not present

## 2015-10-31 DIAGNOSIS — D631 Anemia in chronic kidney disease: Secondary | ICD-10-CM | POA: Diagnosis not present

## 2015-10-31 DIAGNOSIS — D509 Iron deficiency anemia, unspecified: Secondary | ICD-10-CM | POA: Diagnosis not present

## 2015-10-31 DIAGNOSIS — N186 End stage renal disease: Secondary | ICD-10-CM | POA: Diagnosis not present

## 2015-10-31 DIAGNOSIS — Z992 Dependence on renal dialysis: Secondary | ICD-10-CM | POA: Diagnosis not present

## 2015-11-01 ENCOUNTER — Other Ambulatory Visit: Payer: Self-pay | Admitting: Family Medicine

## 2015-11-02 DIAGNOSIS — D509 Iron deficiency anemia, unspecified: Secondary | ICD-10-CM | POA: Diagnosis not present

## 2015-11-02 DIAGNOSIS — D631 Anemia in chronic kidney disease: Secondary | ICD-10-CM | POA: Diagnosis not present

## 2015-11-02 DIAGNOSIS — N186 End stage renal disease: Secondary | ICD-10-CM | POA: Diagnosis not present

## 2015-11-02 DIAGNOSIS — Z992 Dependence on renal dialysis: Secondary | ICD-10-CM | POA: Diagnosis not present

## 2015-11-04 DIAGNOSIS — I713 Abdominal aortic aneurysm, ruptured: Secondary | ICD-10-CM | POA: Diagnosis not present

## 2015-11-04 DIAGNOSIS — J449 Chronic obstructive pulmonary disease, unspecified: Secondary | ICD-10-CM | POA: Diagnosis not present

## 2015-11-04 DIAGNOSIS — R0602 Shortness of breath: Secondary | ICD-10-CM | POA: Diagnosis not present

## 2015-11-04 DIAGNOSIS — J9611 Chronic respiratory failure with hypoxia: Secondary | ICD-10-CM | POA: Diagnosis not present

## 2015-11-04 DIAGNOSIS — F17211 Nicotine dependence, cigarettes, in remission: Secondary | ICD-10-CM | POA: Diagnosis not present

## 2015-11-05 ENCOUNTER — Other Ambulatory Visit: Payer: Self-pay | Admitting: *Deleted

## 2015-11-05 DIAGNOSIS — N186 End stage renal disease: Secondary | ICD-10-CM | POA: Diagnosis not present

## 2015-11-05 DIAGNOSIS — D509 Iron deficiency anemia, unspecified: Secondary | ICD-10-CM | POA: Diagnosis not present

## 2015-11-05 DIAGNOSIS — Z992 Dependence on renal dialysis: Secondary | ICD-10-CM | POA: Diagnosis not present

## 2015-11-05 DIAGNOSIS — D631 Anemia in chronic kidney disease: Secondary | ICD-10-CM | POA: Diagnosis not present

## 2015-11-05 MED ORDER — TRAMADOL HCL 50 MG PO TABS: 50.0000 mg | ORAL_TABLET | Freq: Three times a day (TID) | ORAL | 0 refills | Status: DC | PRN

## 2015-11-05 NOTE — Telephone Encounter (Signed)
Rx called in to requested pharmacy 

## 2015-11-05 NOTE — Telephone Encounter (Signed)
Last f/u 08/2015-CPE

## 2015-11-07 DIAGNOSIS — D631 Anemia in chronic kidney disease: Secondary | ICD-10-CM | POA: Diagnosis not present

## 2015-11-07 DIAGNOSIS — N186 End stage renal disease: Secondary | ICD-10-CM | POA: Diagnosis not present

## 2015-11-07 DIAGNOSIS — Z992 Dependence on renal dialysis: Secondary | ICD-10-CM | POA: Diagnosis not present

## 2015-11-07 DIAGNOSIS — D509 Iron deficiency anemia, unspecified: Secondary | ICD-10-CM | POA: Diagnosis not present

## 2015-11-08 ENCOUNTER — Other Ambulatory Visit: Payer: Self-pay

## 2015-11-08 DIAGNOSIS — F419 Anxiety disorder, unspecified: Secondary | ICD-10-CM | POA: Diagnosis not present

## 2015-11-08 DIAGNOSIS — I132 Hypertensive heart and chronic kidney disease with heart failure and with stage 5 chronic kidney disease, or end stage renal disease: Secondary | ICD-10-CM | POA: Diagnosis not present

## 2015-11-08 DIAGNOSIS — G894 Chronic pain syndrome: Secondary | ICD-10-CM | POA: Diagnosis not present

## 2015-11-08 DIAGNOSIS — I2510 Atherosclerotic heart disease of native coronary artery without angina pectoris: Secondary | ICD-10-CM | POA: Diagnosis not present

## 2015-11-08 DIAGNOSIS — I252 Old myocardial infarction: Secondary | ICD-10-CM | POA: Diagnosis not present

## 2015-11-08 DIAGNOSIS — N186 End stage renal disease: Secondary | ICD-10-CM | POA: Diagnosis not present

## 2015-11-08 DIAGNOSIS — Z992 Dependence on renal dialysis: Principal | ICD-10-CM

## 2015-11-08 DIAGNOSIS — Z9981 Dependence on supplemental oxygen: Secondary | ICD-10-CM | POA: Diagnosis not present

## 2015-11-08 DIAGNOSIS — E785 Hyperlipidemia, unspecified: Secondary | ICD-10-CM | POA: Diagnosis not present

## 2015-11-08 DIAGNOSIS — J9611 Chronic respiratory failure with hypoxia: Secondary | ICD-10-CM | POA: Diagnosis not present

## 2015-11-08 DIAGNOSIS — Z8701 Personal history of pneumonia (recurrent): Secondary | ICD-10-CM | POA: Diagnosis not present

## 2015-11-08 DIAGNOSIS — J449 Chronic obstructive pulmonary disease, unspecified: Secondary | ICD-10-CM | POA: Diagnosis not present

## 2015-11-08 DIAGNOSIS — E039 Hypothyroidism, unspecified: Secondary | ICD-10-CM | POA: Diagnosis not present

## 2015-11-08 DIAGNOSIS — H8110 Benign paroxysmal vertigo, unspecified ear: Secondary | ICD-10-CM | POA: Diagnosis not present

## 2015-11-08 DIAGNOSIS — D631 Anemia in chronic kidney disease: Secondary | ICD-10-CM | POA: Diagnosis not present

## 2015-11-08 DIAGNOSIS — I5030 Unspecified diastolic (congestive) heart failure: Secondary | ICD-10-CM | POA: Diagnosis not present

## 2015-11-08 MED ORDER — ALPRAZOLAM 0.25 MG PO TABS: 0.2500 mg | ORAL_TABLET | Freq: Two times a day (BID) | ORAL | 0 refills | Status: DC | PRN

## 2015-11-08 NOTE — Telephone Encounter (Signed)
Ok to be phoned in to be filled on or after 11/16/15

## 2015-11-08 NOTE — Telephone Encounter (Signed)
Pt left v/m requesting refill alprazolam to [facility]. Last refilled # 20 on 10/16/15 last annual exam on 09/25/15.Please advise.

## 2015-11-09 DIAGNOSIS — Z992 Dependence on renal dialysis: Secondary | ICD-10-CM | POA: Diagnosis not present

## 2015-11-09 DIAGNOSIS — D509 Iron deficiency anemia, unspecified: Secondary | ICD-10-CM | POA: Diagnosis not present

## 2015-11-09 DIAGNOSIS — N186 End stage renal disease: Secondary | ICD-10-CM | POA: Diagnosis not present

## 2015-11-09 DIAGNOSIS — D631 Anemia in chronic kidney disease: Secondary | ICD-10-CM | POA: Diagnosis not present

## 2015-11-11 ENCOUNTER — Telehealth: Payer: Self-pay | Admitting: Family Medicine

## 2015-11-11 DIAGNOSIS — J9611 Chronic respiratory failure with hypoxia: Secondary | ICD-10-CM | POA: Diagnosis not present

## 2015-11-11 DIAGNOSIS — D631 Anemia in chronic kidney disease: Secondary | ICD-10-CM | POA: Diagnosis not present

## 2015-11-11 DIAGNOSIS — I5030 Unspecified diastolic (congestive) heart failure: Secondary | ICD-10-CM | POA: Diagnosis not present

## 2015-11-11 DIAGNOSIS — N186 End stage renal disease: Secondary | ICD-10-CM | POA: Diagnosis not present

## 2015-11-11 DIAGNOSIS — I132 Hypertensive heart and chronic kidney disease with heart failure and with stage 5 chronic kidney disease, or end stage renal disease: Secondary | ICD-10-CM | POA: Diagnosis not present

## 2015-11-11 DIAGNOSIS — J449 Chronic obstructive pulmonary disease, unspecified: Secondary | ICD-10-CM | POA: Diagnosis not present

## 2015-11-11 MED ORDER — HYDRALAZINE HCL 25 MG PO TABS: 25.0000 mg | ORAL_TABLET | Freq: Three times a day (TID) | ORAL | 3 refills | Status: DC

## 2015-11-11 NOTE — Telephone Encounter (Signed)
Rx called in to requested pharmacy 

## 2015-11-11 NOTE — Telephone Encounter (Signed)
I don't want her to stop any medications that she is taking.  Is she being followed by cardiology?

## 2015-11-11 NOTE — Telephone Encounter (Signed)
Pt left VM on Triage phone stating that she had been placed on   hydrALAZINE (APRESOLINE) 25 MG tablet while in the nursing home.  Pt would like to know if Dr. Deborra Medina would like her to continue on this medication every 8 hours.  If so, she will need a prescription sent to [facility].  Best number to call pt is [phone]

## 2015-11-11 NOTE — Telephone Encounter (Signed)
Spoke to pt and advised per [name]. States she is not being followed by cardiology

## 2015-11-11 NOTE — Telephone Encounter (Signed)
Ok to refill rx for 1 month with 3 refills as requested.  Please schedule a follow up in 3 months with me.

## 2015-11-11 NOTE — Telephone Encounter (Signed)
Rx sent to requested pharmacy. LM on pts vm requesting a call back to schedule f/u appt

## 2015-11-12 DIAGNOSIS — Z992 Dependence on renal dialysis: Secondary | ICD-10-CM | POA: Diagnosis not present

## 2015-11-12 DIAGNOSIS — N186 End stage renal disease: Secondary | ICD-10-CM | POA: Diagnosis not present

## 2015-11-12 DIAGNOSIS — D631 Anemia in chronic kidney disease: Secondary | ICD-10-CM | POA: Diagnosis not present

## 2015-11-12 DIAGNOSIS — D509 Iron deficiency anemia, unspecified: Secondary | ICD-10-CM | POA: Diagnosis not present

## 2015-11-13 DIAGNOSIS — J9611 Chronic respiratory failure with hypoxia: Secondary | ICD-10-CM | POA: Diagnosis not present

## 2015-11-13 DIAGNOSIS — N186 End stage renal disease: Secondary | ICD-10-CM | POA: Diagnosis not present

## 2015-11-13 DIAGNOSIS — D631 Anemia in chronic kidney disease: Secondary | ICD-10-CM | POA: Diagnosis not present

## 2015-11-13 DIAGNOSIS — I5030 Unspecified diastolic (congestive) heart failure: Secondary | ICD-10-CM | POA: Diagnosis not present

## 2015-11-13 DIAGNOSIS — J449 Chronic obstructive pulmonary disease, unspecified: Secondary | ICD-10-CM | POA: Diagnosis not present

## 2015-11-13 DIAGNOSIS — I132 Hypertensive heart and chronic kidney disease with heart failure and with stage 5 chronic kidney disease, or end stage renal disease: Secondary | ICD-10-CM | POA: Diagnosis not present

## 2015-11-14 ENCOUNTER — Encounter: Payer: Self-pay | Admitting: *Deleted

## 2015-11-14 ENCOUNTER — Emergency Department: Payer: Medicare Other

## 2015-11-14 ENCOUNTER — Emergency Department
Admission: EM | Admit: 2015-11-14 | Discharge: 2015-11-14 | Disposition: A | Discharge status: Home / Self Care | Payer: Medicare Other | Attending: Emergency Medicine | Admitting: Emergency Medicine

## 2015-11-14 DIAGNOSIS — Z85828 Personal history of other malignant neoplasm of skin: Secondary | ICD-10-CM | POA: Diagnosis not present

## 2015-11-14 DIAGNOSIS — J441 Chronic obstructive pulmonary disease with (acute) exacerbation: Secondary | ICD-10-CM | POA: Insufficient documentation

## 2015-11-14 DIAGNOSIS — I5031 Acute diastolic (congestive) heart failure: Secondary | ICD-10-CM | POA: Insufficient documentation

## 2015-11-14 DIAGNOSIS — N186 End stage renal disease: Secondary | ICD-10-CM | POA: Diagnosis not present

## 2015-11-14 DIAGNOSIS — E039 Hypothyroidism, unspecified: Secondary | ICD-10-CM | POA: Insufficient documentation

## 2015-11-14 DIAGNOSIS — R079 Chest pain, unspecified: Secondary | ICD-10-CM

## 2015-11-14 DIAGNOSIS — Z79899 Other long term (current) drug therapy: Secondary | ICD-10-CM | POA: Insufficient documentation

## 2015-11-14 DIAGNOSIS — Z992 Dependence on renal dialysis: Secondary | ICD-10-CM | POA: Diagnosis not present

## 2015-11-14 DIAGNOSIS — I132 Hypertensive heart and chronic kidney disease with heart failure and with stage 5 chronic kidney disease, or end stage renal disease: Secondary | ICD-10-CM | POA: Diagnosis not present

## 2015-11-14 DIAGNOSIS — D631 Anemia in chronic kidney disease: Secondary | ICD-10-CM | POA: Diagnosis not present

## 2015-11-14 DIAGNOSIS — J90 Pleural effusion, not elsewhere classified: Secondary | ICD-10-CM | POA: Diagnosis not present

## 2015-11-14 DIAGNOSIS — Z87891 Personal history of nicotine dependence: Secondary | ICD-10-CM | POA: Diagnosis not present

## 2015-11-14 DIAGNOSIS — D509 Iron deficiency anemia, unspecified: Secondary | ICD-10-CM | POA: Diagnosis not present

## 2015-11-14 DIAGNOSIS — R0789 Other chest pain: Secondary | ICD-10-CM | POA: Diagnosis not present

## 2015-11-14 LAB — CBC WITH DIFFERENTIAL/PLATELET
BASOS PCT: 1 %
Basophils Absolute: 0 10*3/uL (ref 0–0.1)
EOS ABS: 0.5 10*3/uL (ref 0–0.7)
Eosinophils Relative: 8 %
HEMATOCRIT: 30.6 % — AB (ref 35.0–47.0)
HEMOGLOBIN: 10.3 g/dL — AB (ref 12.0–16.0)
LYMPHS ABS: 0.9 10*3/uL — AB (ref 1.0–3.6)
Lymphocytes Relative: 14 %
MCH: 30.6 pg (ref 26.0–34.0)
MCHC: 33.8 g/dL (ref 32.0–36.0)
MCV: 90.6 fL (ref 80.0–100.0)
MONOS PCT: 10 %
Monocytes Absolute: 0.7 10*3/uL (ref 0.2–0.9)
NEUTROS ABS: 4.4 10*3/uL (ref 1.4–6.5)
NEUTROS PCT: 67 %
Platelets: 199 10*3/uL (ref 150–440)
RBC: 3.38 MIL/uL — AB (ref 3.80–5.20)
RDW: 15.2 % — ABNORMAL HIGH (ref 11.5–14.5)
WBC: 6.5 10*3/uL (ref 3.6–11.0)

## 2015-11-14 LAB — BASIC METABOLIC PANEL
Anion gap: 11 (ref 5–15)
BUN: 24 mg/dL — ABNORMAL HIGH (ref 6–20)
CHLORIDE: 103 mmol/L (ref 101–111)
CO2: 28 mmol/L (ref 22–32)
Calcium: 8.3 mg/dL — ABNORMAL LOW (ref 8.9–10.3)
Creatinine, Ser: 3.47 mg/dL — ABNORMAL HIGH (ref 0.44–1.00)
GFR calc non Af Amer: 13 mL/min — ABNORMAL LOW (ref >60)
GFR, EST AFRICAN AMERICAN: 15 mL/min — AB (ref >60)
Glucose, Bld: 91 mg/dL (ref 65–99)
POTASSIUM: 3.8 mmol/L (ref 3.5–5.1)
SODIUM: 142 mmol/L (ref 135–145)

## 2015-11-14 LAB — TROPONIN I

## 2015-11-14 NOTE — Discharge Instructions (Signed)
Return to the emergency department immediately for any worsening or concerning symptoms, especially shortness of breath. Also, drink only a minimal amount of fluid since you had less than a half session of dialysis today.
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
## 2015-11-14 NOTE — ED Triage Notes (Signed)
pt arrived from dialysis. Halfway through dialysis pt reports sudden onset of generalized chest heaviness. Once EMS arrived pt reported heaviness had subsided. Pt reports having felt "hot and flushed" with SOB when heaviness began and then reports she later felt lightheaded but denies a syncopal episode. Pt is on 2L oxygen at home and was stating 97% on 2L per EMS. Pt receives dialysis Tue, Thur, Sat. Pt in NAD at this time and denies having chest heaviness at this time.

## 2015-11-15 ENCOUNTER — Other Ambulatory Visit: Payer: Self-pay | Admitting: Family Medicine

## 2015-11-15 DIAGNOSIS — Z1231 Encounter for screening mammogram for malignant neoplasm of breast: Secondary | ICD-10-CM

## 2015-11-16 DIAGNOSIS — Z992 Dependence on renal dialysis: Secondary | ICD-10-CM | POA: Diagnosis not present

## 2015-11-16 DIAGNOSIS — D631 Anemia in chronic kidney disease: Secondary | ICD-10-CM | POA: Diagnosis not present

## 2015-11-16 DIAGNOSIS — N186 End stage renal disease: Secondary | ICD-10-CM | POA: Diagnosis not present

## 2015-11-16 DIAGNOSIS — D509 Iron deficiency anemia, unspecified: Secondary | ICD-10-CM | POA: Diagnosis not present

## 2015-11-18 DIAGNOSIS — D631 Anemia in chronic kidney disease: Secondary | ICD-10-CM | POA: Diagnosis not present

## 2015-11-18 DIAGNOSIS — I5030 Unspecified diastolic (congestive) heart failure: Secondary | ICD-10-CM | POA: Diagnosis not present

## 2015-11-18 DIAGNOSIS — N186 End stage renal disease: Secondary | ICD-10-CM | POA: Diagnosis not present

## 2015-11-18 DIAGNOSIS — I132 Hypertensive heart and chronic kidney disease with heart failure and with stage 5 chronic kidney disease, or end stage renal disease: Secondary | ICD-10-CM | POA: Diagnosis not present

## 2015-11-19 DIAGNOSIS — N186 End stage renal disease: Secondary | ICD-10-CM | POA: Diagnosis not present

## 2015-11-19 DIAGNOSIS — D631 Anemia in chronic kidney disease: Secondary | ICD-10-CM | POA: Diagnosis not present

## 2015-11-19 DIAGNOSIS — Z992 Dependence on renal dialysis: Secondary | ICD-10-CM | POA: Diagnosis not present

## 2015-11-19 DIAGNOSIS — D509 Iron deficiency anemia, unspecified: Secondary | ICD-10-CM | POA: Diagnosis not present

## 2015-11-20 ENCOUNTER — Other Ambulatory Visit (INDEPENDENT_AMBULATORY_CARE_PROVIDER_SITE_OTHER): Payer: Medicare Other

## 2015-11-20 ENCOUNTER — Other Ambulatory Visit: Payer: Self-pay | Admitting: Family Medicine

## 2015-11-20 DIAGNOSIS — N186 End stage renal disease: Secondary | ICD-10-CM | POA: Diagnosis not present

## 2015-11-20 DIAGNOSIS — E039 Hypothyroidism, unspecified: Secondary | ICD-10-CM | POA: Diagnosis not present

## 2015-11-20 DIAGNOSIS — J9611 Chronic respiratory failure with hypoxia: Secondary | ICD-10-CM | POA: Diagnosis not present

## 2015-11-20 DIAGNOSIS — I132 Hypertensive heart and chronic kidney disease with heart failure and with stage 5 chronic kidney disease, or end stage renal disease: Secondary | ICD-10-CM | POA: Diagnosis not present

## 2015-11-20 DIAGNOSIS — I5030 Unspecified diastolic (congestive) heart failure: Secondary | ICD-10-CM | POA: Diagnosis not present

## 2015-11-20 DIAGNOSIS — D631 Anemia in chronic kidney disease: Secondary | ICD-10-CM | POA: Diagnosis not present

## 2015-11-20 DIAGNOSIS — J449 Chronic obstructive pulmonary disease, unspecified: Secondary | ICD-10-CM | POA: Diagnosis not present

## 2015-11-20 LAB — T4, FREE: Free T4: 2.54 ng/dL — ABNORMAL HIGH (ref 0.60–1.60)

## 2015-11-20 LAB — T3, FREE: T3, Free: 4.8 pg/mL — ABNORMAL HIGH (ref 2.3–4.2)

## 2015-11-20 LAB — TSH: TSH: 18.02 u[IU]/mL — AB (ref 0.35–4.50)

## 2015-11-21 ENCOUNTER — Other Ambulatory Visit: Payer: Self-pay | Admitting: Family Medicine

## 2015-11-21 DIAGNOSIS — Z992 Dependence on renal dialysis: Secondary | ICD-10-CM | POA: Diagnosis not present

## 2015-11-21 DIAGNOSIS — R7989 Other specified abnormal findings of blood chemistry: Secondary | ICD-10-CM

## 2015-11-21 DIAGNOSIS — D631 Anemia in chronic kidney disease: Secondary | ICD-10-CM | POA: Diagnosis not present

## 2015-11-21 DIAGNOSIS — N186 End stage renal disease: Secondary | ICD-10-CM | POA: Diagnosis not present

## 2015-11-21 DIAGNOSIS — D509 Iron deficiency anemia, unspecified: Secondary | ICD-10-CM | POA: Diagnosis not present

## 2015-11-23 DIAGNOSIS — D631 Anemia in chronic kidney disease: Secondary | ICD-10-CM | POA: Diagnosis not present

## 2015-11-23 DIAGNOSIS — N186 End stage renal disease: Secondary | ICD-10-CM | POA: Diagnosis not present

## 2015-11-23 DIAGNOSIS — Z992 Dependence on renal dialysis: Secondary | ICD-10-CM | POA: Diagnosis not present

## 2015-11-23 DIAGNOSIS — D509 Iron deficiency anemia, unspecified: Secondary | ICD-10-CM | POA: Diagnosis not present

## 2015-11-26 DIAGNOSIS — D631 Anemia in chronic kidney disease: Secondary | ICD-10-CM | POA: Diagnosis not present

## 2015-11-26 DIAGNOSIS — D509 Iron deficiency anemia, unspecified: Secondary | ICD-10-CM | POA: Diagnosis not present

## 2015-11-26 DIAGNOSIS — N186 End stage renal disease: Secondary | ICD-10-CM | POA: Diagnosis not present

## 2015-11-26 DIAGNOSIS — Z992 Dependence on renal dialysis: Secondary | ICD-10-CM | POA: Diagnosis not present

## 2015-11-27 DIAGNOSIS — I132 Hypertensive heart and chronic kidney disease with heart failure and with stage 5 chronic kidney disease, or end stage renal disease: Secondary | ICD-10-CM | POA: Diagnosis not present

## 2015-11-27 DIAGNOSIS — I5030 Unspecified diastolic (congestive) heart failure: Secondary | ICD-10-CM | POA: Diagnosis not present

## 2015-11-27 DIAGNOSIS — N186 End stage renal disease: Secondary | ICD-10-CM | POA: Diagnosis not present

## 2015-11-27 DIAGNOSIS — J9611 Chronic respiratory failure with hypoxia: Secondary | ICD-10-CM | POA: Diagnosis not present

## 2015-11-27 DIAGNOSIS — D631 Anemia in chronic kidney disease: Secondary | ICD-10-CM | POA: Diagnosis not present

## 2015-11-27 DIAGNOSIS — J449 Chronic obstructive pulmonary disease, unspecified: Secondary | ICD-10-CM | POA: Diagnosis not present

## 2015-11-28 DIAGNOSIS — N186 End stage renal disease: Secondary | ICD-10-CM | POA: Diagnosis not present

## 2015-11-28 DIAGNOSIS — Z992 Dependence on renal dialysis: Secondary | ICD-10-CM | POA: Diagnosis not present

## 2015-11-28 DIAGNOSIS — D631 Anemia in chronic kidney disease: Secondary | ICD-10-CM | POA: Diagnosis not present

## 2015-11-28 DIAGNOSIS — D509 Iron deficiency anemia, unspecified: Secondary | ICD-10-CM | POA: Diagnosis not present

## 2015-11-30 DIAGNOSIS — Z992 Dependence on renal dialysis: Secondary | ICD-10-CM | POA: Diagnosis not present

## 2015-11-30 DIAGNOSIS — N186 End stage renal disease: Secondary | ICD-10-CM | POA: Diagnosis not present

## 2015-11-30 DIAGNOSIS — D509 Iron deficiency anemia, unspecified: Secondary | ICD-10-CM | POA: Diagnosis not present

## 2015-11-30 DIAGNOSIS — D631 Anemia in chronic kidney disease: Secondary | ICD-10-CM | POA: Diagnosis not present

## 2015-11-30 DIAGNOSIS — Z23 Encounter for immunization: Secondary | ICD-10-CM | POA: Diagnosis not present

## 2015-11-30 DIAGNOSIS — N2581 Secondary hyperparathyroidism of renal origin: Secondary | ICD-10-CM | POA: Diagnosis not present

## 2015-12-03 DIAGNOSIS — D509 Iron deficiency anemia, unspecified: Secondary | ICD-10-CM | POA: Diagnosis not present

## 2015-12-03 DIAGNOSIS — Z992 Dependence on renal dialysis: Secondary | ICD-10-CM | POA: Diagnosis not present

## 2015-12-03 DIAGNOSIS — N186 End stage renal disease: Secondary | ICD-10-CM | POA: Diagnosis not present

## 2015-12-03 DIAGNOSIS — D631 Anemia in chronic kidney disease: Secondary | ICD-10-CM | POA: Diagnosis not present

## 2015-12-03 DIAGNOSIS — Z23 Encounter for immunization: Secondary | ICD-10-CM | POA: Diagnosis not present

## 2015-12-03 DIAGNOSIS — N2581 Secondary hyperparathyroidism of renal origin: Secondary | ICD-10-CM | POA: Diagnosis not present

## 2015-12-04 ENCOUNTER — Ambulatory Visit: Payer: Medicare Other | Admitting: Certified Nurse Midwife

## 2015-12-04 ENCOUNTER — Ambulatory Visit: Payer: Medicare Other

## 2015-12-04 DIAGNOSIS — N186 End stage renal disease: Secondary | ICD-10-CM | POA: Diagnosis not present

## 2015-12-04 DIAGNOSIS — T827XXD Infection and inflammatory reaction due to other cardiac and vascular devices, implants and grafts, subsequent encounter: Secondary | ICD-10-CM | POA: Diagnosis not present

## 2015-12-04 DIAGNOSIS — Z1621 Resistance to vancomycin: Secondary | ICD-10-CM | POA: Diagnosis not present

## 2015-12-04 DIAGNOSIS — A491 Streptococcal infection, unspecified site: Secondary | ICD-10-CM | POA: Diagnosis not present

## 2015-12-04 DIAGNOSIS — Z792 Long term (current) use of antibiotics: Secondary | ICD-10-CM | POA: Diagnosis not present

## 2015-12-05 DIAGNOSIS — D509 Iron deficiency anemia, unspecified: Secondary | ICD-10-CM | POA: Diagnosis not present

## 2015-12-05 DIAGNOSIS — N186 End stage renal disease: Secondary | ICD-10-CM | POA: Diagnosis not present

## 2015-12-05 DIAGNOSIS — N2581 Secondary hyperparathyroidism of renal origin: Secondary | ICD-10-CM | POA: Diagnosis not present

## 2015-12-05 DIAGNOSIS — Z992 Dependence on renal dialysis: Secondary | ICD-10-CM | POA: Diagnosis not present

## 2015-12-05 DIAGNOSIS — D631 Anemia in chronic kidney disease: Secondary | ICD-10-CM | POA: Diagnosis not present

## 2015-12-05 DIAGNOSIS — Z23 Encounter for immunization: Secondary | ICD-10-CM | POA: Diagnosis not present

## 2015-12-07 DIAGNOSIS — N2581 Secondary hyperparathyroidism of renal origin: Secondary | ICD-10-CM | POA: Diagnosis not present

## 2015-12-07 DIAGNOSIS — Z992 Dependence on renal dialysis: Secondary | ICD-10-CM | POA: Diagnosis not present

## 2015-12-07 DIAGNOSIS — Z23 Encounter for immunization: Secondary | ICD-10-CM | POA: Diagnosis not present

## 2015-12-07 DIAGNOSIS — N186 End stage renal disease: Secondary | ICD-10-CM | POA: Diagnosis not present

## 2015-12-07 DIAGNOSIS — D631 Anemia in chronic kidney disease: Secondary | ICD-10-CM | POA: Diagnosis not present

## 2015-12-07 DIAGNOSIS — D509 Iron deficiency anemia, unspecified: Secondary | ICD-10-CM | POA: Diagnosis not present

## 2015-12-09 ENCOUNTER — Ambulatory Visit: Payer: Medicare Other

## 2015-12-09 DIAGNOSIS — D509 Iron deficiency anemia, unspecified: Secondary | ICD-10-CM | POA: Diagnosis not present

## 2015-12-09 DIAGNOSIS — Z23 Encounter for immunization: Secondary | ICD-10-CM | POA: Diagnosis not present

## 2015-12-09 DIAGNOSIS — N186 End stage renal disease: Secondary | ICD-10-CM | POA: Diagnosis not present

## 2015-12-09 DIAGNOSIS — Z992 Dependence on renal dialysis: Secondary | ICD-10-CM | POA: Diagnosis not present

## 2015-12-09 DIAGNOSIS — D631 Anemia in chronic kidney disease: Secondary | ICD-10-CM | POA: Diagnosis not present

## 2015-12-09 DIAGNOSIS — N2581 Secondary hyperparathyroidism of renal origin: Secondary | ICD-10-CM | POA: Diagnosis not present

## 2015-12-10 ENCOUNTER — Telehealth: Payer: Self-pay | Admitting: *Deleted

## 2015-12-10 ENCOUNTER — Other Ambulatory Visit: Payer: Self-pay | Admitting: Family Medicine

## 2015-12-10 NOTE — Telephone Encounter (Signed)
Last f/u 08/2015

## 2015-12-11 ENCOUNTER — Encounter: Payer: Self-pay | Admitting: Certified Nurse Midwife

## 2015-12-11 ENCOUNTER — Ambulatory Visit (INDEPENDENT_AMBULATORY_CARE_PROVIDER_SITE_OTHER): Payer: Medicare Other | Admitting: Certified Nurse Midwife

## 2015-12-11 VITALS — BP 108/68 | HR 72 | Resp 16

## 2015-12-11 DIAGNOSIS — Z01419 Encounter for gynecological examination (general) (routine) without abnormal findings: Secondary | ICD-10-CM | POA: Diagnosis not present

## 2015-12-11 DIAGNOSIS — N891 Moderate vaginal dysplasia: Secondary | ICD-10-CM | POA: Diagnosis not present

## 2015-12-11 DIAGNOSIS — Z659 Problem related to unspecified psychosocial circumstances: Secondary | ICD-10-CM

## 2015-12-11 DIAGNOSIS — Z1272 Encounter for screening for malignant neoplasm of vagina: Secondary | ICD-10-CM | POA: Diagnosis not present

## 2015-12-11 DIAGNOSIS — Z124 Encounter for screening for malignant neoplasm of cervix: Secondary | ICD-10-CM
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
## 2015-12-11 NOTE — Patient Instructions (Signed)

## 2015-12-12 DIAGNOSIS — D631 Anemia in chronic kidney disease: Secondary | ICD-10-CM | POA: Diagnosis not present

## 2015-12-12 DIAGNOSIS — N2581 Secondary hyperparathyroidism of renal origin: Secondary | ICD-10-CM | POA: Diagnosis not present

## 2015-12-12 DIAGNOSIS — Z1159 Encounter for screening for other viral diseases: Secondary | ICD-10-CM | POA: Diagnosis not present

## 2015-12-12 DIAGNOSIS — Z23 Encounter for immunization: Secondary | ICD-10-CM | POA: Diagnosis not present

## 2015-12-12 DIAGNOSIS — N186 End stage renal disease: Secondary | ICD-10-CM | POA: Diagnosis not present

## 2015-12-12 DIAGNOSIS — Z992 Dependence on renal dialysis: Secondary | ICD-10-CM | POA: Diagnosis not present

## 2015-12-12 DIAGNOSIS — D509 Iron deficiency anemia, unspecified: Secondary | ICD-10-CM | POA: Diagnosis not present

## 2015-12-12 LAB — IPS PAP SMEAR ONLY

## 2015-12-12 NOTE — Progress Notes (Signed)
Encounter reviewed Jill Jertson, MD   

## 2015-12-13 ENCOUNTER — Other Ambulatory Visit: Payer: Self-pay | Admitting: Certified Nurse Midwife

## 2015-12-13 DIAGNOSIS — B373 Candidiasis of vulva and vagina: Secondary | ICD-10-CM

## 2015-12-13 DIAGNOSIS — B3731 Acute candidiasis of vulva and vagina: Secondary | ICD-10-CM

## 2015-12-13 MED ORDER — TERCONAZOLE 0.4 % VA CREA
1.0000 | TOPICAL_CREAM | Freq: Every day | VAGINAL | 0 refills | Status: DC
Start: 2015-12-13 — End: 2016-05-11

## 2015-12-14 DIAGNOSIS — Z23 Encounter for immunization: Secondary | ICD-10-CM | POA: Diagnosis not present

## 2015-12-14 DIAGNOSIS — N186 End stage renal disease: Secondary | ICD-10-CM | POA: Diagnosis not present

## 2015-12-14 DIAGNOSIS — D631 Anemia in chronic kidney disease: Secondary | ICD-10-CM | POA: Diagnosis not present

## 2015-12-14 DIAGNOSIS — N2581 Secondary hyperparathyroidism of renal origin: Secondary | ICD-10-CM | POA: Diagnosis not present

## 2015-12-14 DIAGNOSIS — D509 Iron deficiency anemia, unspecified: Secondary | ICD-10-CM | POA: Diagnosis not present

## 2015-12-14 DIAGNOSIS — Z992 Dependence on renal dialysis: Secondary | ICD-10-CM | POA: Diagnosis not present

## 2015-12-17 ENCOUNTER — Ambulatory Visit: Payer: Medicare Other

## 2015-12-17 ENCOUNTER — Other Ambulatory Visit: Payer: Self-pay | Admitting: Family Medicine

## 2015-12-17 DIAGNOSIS — D509 Iron deficiency anemia, unspecified: Secondary | ICD-10-CM | POA: Diagnosis not present

## 2015-12-17 DIAGNOSIS — N186 End stage renal disease: Secondary | ICD-10-CM | POA: Diagnosis not present

## 2015-12-17 DIAGNOSIS — Z992 Dependence on renal dialysis: Secondary | ICD-10-CM | POA: Diagnosis not present

## 2015-12-17 DIAGNOSIS — N2581 Secondary hyperparathyroidism of renal origin: Secondary | ICD-10-CM | POA: Diagnosis not present

## 2015-12-17 DIAGNOSIS — Z23 Encounter for immunization: Secondary | ICD-10-CM | POA: Diagnosis not present

## 2015-12-17 DIAGNOSIS — D631 Anemia in chronic kidney disease: Secondary | ICD-10-CM | POA: Diagnosis not present

## 2015-12-19 DIAGNOSIS — D631 Anemia in chronic kidney disease: Secondary | ICD-10-CM | POA: Diagnosis not present

## 2015-12-19 DIAGNOSIS — Z23 Encounter for immunization: Secondary | ICD-10-CM | POA: Diagnosis not present

## 2015-12-19 DIAGNOSIS — N186 End stage renal disease: Secondary | ICD-10-CM | POA: Diagnosis not present

## 2015-12-19 DIAGNOSIS — Z992 Dependence on renal dialysis: Secondary | ICD-10-CM | POA: Diagnosis not present

## 2015-12-19 DIAGNOSIS — N2581 Secondary hyperparathyroidism of renal origin: Secondary | ICD-10-CM | POA: Diagnosis not present

## 2015-12-19 DIAGNOSIS — D509 Iron deficiency anemia, unspecified: Secondary | ICD-10-CM | POA: Diagnosis not present

## 2015-12-21 DIAGNOSIS — D631 Anemia in chronic kidney disease: Secondary | ICD-10-CM | POA: Diagnosis not present

## 2015-12-21 DIAGNOSIS — Z992 Dependence on renal dialysis: Secondary | ICD-10-CM | POA: Diagnosis not present

## 2015-12-21 DIAGNOSIS — N186 End stage renal disease: Secondary | ICD-10-CM | POA: Diagnosis not present

## 2015-12-21 DIAGNOSIS — N2581 Secondary hyperparathyroidism of renal origin: Secondary | ICD-10-CM | POA: Diagnosis not present

## 2015-12-21 DIAGNOSIS — D509 Iron deficiency anemia, unspecified: Secondary | ICD-10-CM | POA: Diagnosis not present

## 2015-12-21 DIAGNOSIS — Z23 Encounter for immunization: Secondary | ICD-10-CM | POA: Diagnosis not present

## 2015-12-23 ENCOUNTER — Other Ambulatory Visit: Payer: Self-pay | Admitting: Vascular Surgery

## 2015-12-23 DIAGNOSIS — N185 Chronic kidney disease, stage 5: Secondary | ICD-10-CM | POA: Diagnosis not present

## 2015-12-23 DIAGNOSIS — E876 Hypokalemia: Secondary | ICD-10-CM | POA: Diagnosis not present

## 2015-12-23 DIAGNOSIS — D72829 Elevated white blood cell count, unspecified: Secondary | ICD-10-CM | POA: Diagnosis not present

## 2015-12-23 DIAGNOSIS — I890 Lymphedema, not elsewhere classified: Secondary | ICD-10-CM | POA: Diagnosis not present

## 2015-12-23 DIAGNOSIS — Y841 Kidney dialysis as the cause of abnormal reaction of the patient, or of later complication, without mention of misadventure at the time of the procedure: Secondary | ICD-10-CM | POA: Diagnosis not present

## 2015-12-23 DIAGNOSIS — T888XXA Other specified complications of surgical and medical care, not elsewhere classified, initial encounter: Secondary | ICD-10-CM | POA: Diagnosis not present

## 2015-12-23 DIAGNOSIS — N186 End stage renal disease: Secondary | ICD-10-CM | POA: Diagnosis not present

## 2015-12-23 DIAGNOSIS — R0602 Shortness of breath: Secondary | ICD-10-CM | POA: Diagnosis not present

## 2015-12-23 DIAGNOSIS — I70213 Atherosclerosis of native arteries of extremities with intermittent claudication, bilateral legs: Secondary | ICD-10-CM | POA: Diagnosis not present

## 2015-12-23 DIAGNOSIS — L7634 Postprocedural seroma of skin and subcutaneous tissue following other procedure: Secondary | ICD-10-CM | POA: Diagnosis not present

## 2015-12-23 DIAGNOSIS — Z992 Dependence on renal dialysis: Secondary | ICD-10-CM | POA: Diagnosis not present

## 2015-12-23 DIAGNOSIS — I714 Abdominal aortic aneurysm, without rupture: Secondary | ICD-10-CM | POA: Diagnosis not present

## 2015-12-24 ENCOUNTER — Other Ambulatory Visit: Payer: Self-pay | Admitting: Vascular Surgery

## 2015-12-24 DIAGNOSIS — D509 Iron deficiency anemia, unspecified: Secondary | ICD-10-CM | POA: Diagnosis not present

## 2015-12-24 DIAGNOSIS — N2581 Secondary hyperparathyroidism of renal origin: Secondary | ICD-10-CM | POA: Diagnosis not present

## 2015-12-24 DIAGNOSIS — Z23 Encounter for immunization: Secondary | ICD-10-CM | POA: Diagnosis not present

## 2015-12-24 DIAGNOSIS — Z992 Dependence on renal dialysis: Secondary | ICD-10-CM | POA: Diagnosis not present

## 2015-12-24 DIAGNOSIS — N186 End stage renal disease: Secondary | ICD-10-CM | POA: Diagnosis not present

## 2015-12-24 DIAGNOSIS — D631 Anemia in chronic kidney disease: Secondary | ICD-10-CM | POA: Diagnosis not present

## 2015-12-25 ENCOUNTER — Encounter
Admission: RE | Disposition: A | Discharge status: Home / Self Care | Payer: Self-pay | Source: Ambulatory Visit | Attending: Vascular Surgery

## 2015-12-25 ENCOUNTER — Encounter
Admission: RE | Admit: 2015-12-25 | Discharge: 2015-12-25 | Disposition: A | Discharge status: Home / Self Care | Payer: Medicare Other | Source: Ambulatory Visit | Attending: Vascular Surgery | Admitting: Vascular Surgery

## 2015-12-25 ENCOUNTER — Ambulatory Visit: Payer: Medicare Other

## 2015-12-25 ENCOUNTER — Ambulatory Visit
Admission: RE | Admit: 2015-12-25 | Discharge: 2015-12-25 | Disposition: A | Discharge status: Home / Self Care | Payer: Medicare Other | Source: Ambulatory Visit | Attending: Vascular Surgery | Admitting: Vascular Surgery

## 2015-12-25 DIAGNOSIS — N185 Chronic kidney disease, stage 5: Secondary | ICD-10-CM | POA: Diagnosis not present

## 2015-12-25 DIAGNOSIS — E785 Hyperlipidemia, unspecified: Secondary | ICD-10-CM | POA: Diagnosis not present

## 2015-12-25 DIAGNOSIS — T8241XA Breakdown (mechanical) of vascular dialysis catheter, initial encounter: Secondary | ICD-10-CM | POA: Diagnosis not present

## 2015-12-25 DIAGNOSIS — I70213 Atherosclerosis of native arteries of extremities with intermittent claudication, bilateral legs: Secondary | ICD-10-CM | POA: Diagnosis not present

## 2015-12-25 DIAGNOSIS — Z992 Dependence on renal dialysis: Secondary | ICD-10-CM | POA: Diagnosis not present

## 2015-12-25 DIAGNOSIS — Z90710 Acquired absence of both cervix and uterus: Secondary | ICD-10-CM | POA: Diagnosis not present

## 2015-12-25 DIAGNOSIS — Y841 Kidney dialysis as the cause of abnormal reaction of the patient, or of later complication, without mention of misadventure at the time of the procedure: Secondary | ICD-10-CM | POA: Diagnosis not present

## 2015-12-25 DIAGNOSIS — E876 Hypokalemia: Secondary | ICD-10-CM | POA: Diagnosis not present

## 2015-12-25 DIAGNOSIS — T82868A Thrombosis of vascular prosthetic devices, implants and grafts, initial encounter: Secondary | ICD-10-CM | POA: Diagnosis not present

## 2015-12-25 DIAGNOSIS — E079 Disorder of thyroid, unspecified: Secondary | ICD-10-CM | POA: Insufficient documentation

## 2015-12-25 DIAGNOSIS — L7634 Postprocedural seroma of skin and subcutaneous tissue following other procedure: Secondary | ICD-10-CM | POA: Diagnosis not present

## 2015-12-25 DIAGNOSIS — Y838 Other surgical procedures as the cause of abnormal reaction of the patient, or of later complication, without mention of misadventure at the time of the procedure: Secondary | ICD-10-CM | POA: Insufficient documentation

## 2015-12-25 DIAGNOSIS — T888XXA Other specified complications of surgical and medical care, not elsewhere classified, initial encounter: Secondary | ICD-10-CM | POA: Diagnosis not present

## 2015-12-25 DIAGNOSIS — I714 Abdominal aortic aneurysm, without rupture: Secondary | ICD-10-CM | POA: Diagnosis not present

## 2015-12-25 DIAGNOSIS — N186 End stage renal disease: Secondary | ICD-10-CM | POA: Diagnosis not present

## 2015-12-25 DIAGNOSIS — Z87891 Personal history of nicotine dependence: Secondary | ICD-10-CM | POA: Insufficient documentation

## 2015-12-25 DIAGNOSIS — Z792 Long term (current) use of antibiotics: Secondary | ICD-10-CM | POA: Diagnosis not present

## 2015-12-25 DIAGNOSIS — I890 Lymphedema, not elsewhere classified: Secondary | ICD-10-CM | POA: Diagnosis not present

## 2015-12-25 DIAGNOSIS — Z886 Allergy status to analgesic agent status: Secondary | ICD-10-CM | POA: Diagnosis not present

## 2015-12-25 DIAGNOSIS — R0602 Shortness of breath: Secondary | ICD-10-CM | POA: Diagnosis not present

## 2015-12-25 DIAGNOSIS — Z888 Allergy status to other drugs, medicaments and biological substances status: Secondary | ICD-10-CM | POA: Insufficient documentation

## 2015-12-25 HISTORY — PX: PERIPHERAL VASCULAR CATHETERIZATION: SHX172C

## 2015-12-25 HISTORY — DX: Diverticulosis of intestine, part unspecified, without perforation or abscess without bleeding: K57.90

## 2015-12-25 HISTORY — DX: Anemia, unspecified: D64.9

## 2015-12-25 HISTORY — DX: Hypothyroidism, unspecified: E03.9

## 2015-12-25 HISTORY — DX: Anxiety disorder, unspecified: F41.9

## 2015-12-25 HISTORY — DX: Dependence on supplemental oxygen: Z99.81

## 2015-12-25 HISTORY — DX: Cardiac murmur, unspecified: R01.1

## 2015-12-25 HISTORY — DX: Heart failure, unspecified: I50.9

## 2015-12-25 HISTORY — DX: Dependence on renal dialysis: Z99.2

## 2015-12-25 LAB — BASIC METABOLIC PANEL
Anion gap: 16 — ABNORMAL HIGH (ref 5–15)
BUN: 53 mg/dL — AB (ref 6–20)
CHLORIDE: 102 mmol/L (ref 101–111)
CO2: 24 mmol/L (ref 22–32)
CREATININE: 8.39 mg/dL — AB (ref 0.44–1.00)
Calcium: 9.4 mg/dL (ref 8.9–10.3)
GFR calc Af Amer: 5 mL/min — ABNORMAL LOW (ref >60)
GFR calc non Af Amer: 4 mL/min — ABNORMAL LOW (ref >60)
GLUCOSE: 66 mg/dL (ref 65–99)
POTASSIUM: 5.4 mmol/L — AB (ref 3.5–5.1)
SODIUM: 142 mmol/L (ref 135–145)

## 2015-12-25 LAB — TYPE AND SCREEN
ABO/RH(D): B POS
ANTIBODY SCREEN: NEGATIVE

## 2015-12-25 LAB — CBC WITH DIFFERENTIAL/PLATELET
Basophils Absolute: 0.1 10*3/uL (ref 0–0.1)
Basophils Relative: 1 %
EOS ABS: 0.4 10*3/uL (ref 0–0.7)
EOS PCT: 6 %
HCT: 38.7 % (ref 35.0–47.0)
Hemoglobin: 13 g/dL (ref 12.0–16.0)
LYMPHS ABS: 1.6 10*3/uL (ref 1.0–3.6)
LYMPHS PCT: 26 %
MCH: 31.1 pg (ref 26.0–34.0)
MCHC: 33.6 g/dL (ref 32.0–36.0)
MCV: 92.5 fL (ref 80.0–100.0)
MONO ABS: 0.7 10*3/uL (ref 0.2–0.9)
MONOS PCT: 10 %
Neutro Abs: 3.6 10*3/uL (ref 1.4–6.5)
Neutrophils Relative %: 57 %
PLATELETS: 185 10*3/uL (ref 150–440)
RBC: 4.19 MIL/uL (ref 3.80–5.20)
RDW: 17.2 % — ABNORMAL HIGH (ref 11.5–14.5)
WBC: 6.3 10*3/uL (ref 3.6–11.0)

## 2015-12-25 LAB — APTT: aPTT: 33 seconds (ref 24–36)

## 2015-12-25 LAB — SURGICAL PCR SCREEN
MRSA, PCR: POSITIVE — AB
STAPHYLOCOCCUS AUREUS: POSITIVE — AB

## 2015-12-25 LAB — PROTIME-INR
INR: 0.96
PROTHROMBIN TIME: 12.8 s (ref 11.4–15.2)

## 2015-12-25 SURGERY — DIALYSIS/PERMA CATHETER INSERTION
Anesthesia: Moderate Sedation

## 2015-12-25 MED ORDER — SODIUM CHLORIDE 0.9 % IV SOLN: INTRAVENOUS | Status: DC

## 2015-12-25 MED ORDER — MIDAZOLAM HCL 5 MG/5ML IJ SOLN
INTRAMUSCULAR | Status: AC
Start: 2015-12-25 — End: 2015-12-25
  Filled 2015-12-25: qty 5

## 2015-12-25 MED ORDER — FENTANYL CITRATE (PF) 100 MCG/2ML IJ SOLN
INTRAMUSCULAR | Status: AC
  Filled 2015-12-25: qty 2

## 2015-12-25 MED ORDER — CEFUROXIME SODIUM 1.5 G IJ SOLR: 1.5000 g | INTRAMUSCULAR | Status: DC

## 2015-12-25 MED ORDER — MIDAZOLAM HCL 2 MG/2ML IJ SOLN
INTRAMUSCULAR | Status: DC | PRN
  Administered 2015-12-25: 2 mg via INTRAVENOUS

## 2015-12-25 MED ORDER — METHYLPREDNISOLONE SODIUM SUCC 125 MG IJ SOLR: 125.0000 mg | INTRAMUSCULAR | Status: DC | PRN

## 2015-12-25 MED ORDER — HYDROMORPHONE HCL 1 MG/ML IJ SOLN: 1.0000 mg | Freq: Once | INTRAMUSCULAR | Status: DC

## 2015-12-25 MED ORDER — SODIUM CHLORIDE 0.9 % IV SOLN
INTRAVENOUS | Status: DC
Start: 2015-12-25 — End: 2015-12-25

## 2015-12-25 MED ORDER — FENTANYL CITRATE (PF) 100 MCG/2ML IJ SOLN
INTRAMUSCULAR | Status: DC | PRN
  Administered 2015-12-25: 50 ug via INTRAVENOUS

## 2015-12-25 MED ORDER — LIDOCAINE-EPINEPHRINE (PF) 1 %-1:200000 IJ SOLN
INTRAMUSCULAR | Status: AC
  Filled 2015-12-25: qty 30

## 2015-12-25 MED ORDER — ONDANSETRON HCL 4 MG/2ML IJ SOLN: 4.0000 mg | Freq: Four times a day (QID) | INTRAMUSCULAR | Status: DC | PRN

## 2015-12-25 MED ORDER — HEPARIN (PORCINE) IN NACL 2-0.9 UNIT/ML-% IJ SOLN
INTRAMUSCULAR | Status: AC
  Filled 2015-12-25: qty 500

## 2015-12-25 MED ORDER — FAMOTIDINE 20 MG PO TABS: 40.0000 mg | ORAL_TABLET | ORAL | Status: DC | PRN

## 2015-12-25 SURGICAL SUPPLY — 5 items
CATH PALINDROME-P 23CM W/VT (CATHETERS) ×2 IMPLANT
GUIDEWIRE SUPER STIFF .035X180 (WIRE) ×2 IMPLANT
PACK ANGIOGRAPHY (CUSTOM PROCEDURE TRAY) ×2 IMPLANT
SET INTRO CAPELLA COAXIAL (SET/KITS/TRAYS/PACK) ×2 IMPLANT
TOWEL OR 17X26 4PK STRL BLUE (TOWEL DISPOSABLE) ×2 IMPLANT

## 2015-12-25 NOTE — H&P (Signed)
[facility] History & Physical Update  The patient was interviewed and re-examined.  The patient's previous History and Physical has been reviewed and is unchanged.  There is no change in the plan of care. We plan to proceed with the scheduled procedure.  Hortencia Pilar, MD  12/25/2015, 3:47 PM

## 2015-12-25 NOTE — Patient Instructions (Signed)
  Your procedure is scheduled on: January 10, 2016 (Friday) Report to [facility] Day Surgery 2nd floor [facility] To find out your arrival time please call [phone] between 1PM - 3PM on January 09, 2016 (Thursday)  Remember: Instructions that are not followed completely may result in serious medical risk, up to and including death, or upon the discretion of your surgeon and anesthesiologist your surgery may need to be rescheduled.    _x___ 1. Do not eat food or drink liquids after midnight. No gum chewing or hard candies.     _x    __ 2. No Alcohol for 24 hours before or after surgery.   _x     __3. No Smoking for 24 prior to surgery.   ____  4. Bring all medications with you on the day of surgery if instructed.    __x__ 5. Notify your doctor if there is any change in your medical condition     (cold, fever, infections).     Do not wear jewelry, make-up, hairpins, clips or nail polish.  Do not wear lotions, powders, or perfumes. You may wear deodorant.  Do not shave 48 hours prior to surgery. Men may shave face and neck.  Do not bring valuables to the hospital.    [facility] is not responsible for any belongings or valuables.               Contacts, dentures or bridgework may not be worn into surgery.  Leave your suitcase in the car. After surgery it may be brought to your room.  For patients admitted to the hospital, discharge time is determined by your treatment team.   Patients discharged the day of surgery will not be allowed to drive home.    Please read over the following fact sheets that you were given:   [facility] Preparing for Surgery and or MRSA Information   _x___ Take these medicines the morning of surgery with A SIP OF WATER:    1. Metoprolol  2. Pantoprazole  3. HydrALAZINE  4. Levothyroxine  5.  6.  ____Fleets enema or Magnesium Citrate as directed.   _x___ Use CHG Soap or sage wipes as directed on instruction sheet   _x___ Use inhalers on the day of  surgery and bring to hospital day of surgery (Use Albuterol and Advair inhaler  the morning of surgery and bring to hospital)  ____ Stop metformin 2 days prior to surgery    ____ Take 1/2 of usual insulin dose the night before surgery and none on the morning of  surgery.         _x___ Stop aspirin or coumadin, or plavix  (NO ASPIRIN)  x__ Stop Anti-inflammatories such as Advil, Aleve, Ibuprofen, Motrin, Naproxen,          Naprosyn, Goodies powders or aspirin products. Ok to take Tylenol.   ____ Stop supplements until after surgery.    ____ Bring C-Pap to the hospital.

## 2015-12-25 NOTE — Op Note (Signed)
[facility]   OPERATIVE NOTE     PROCEDURE: 1. Exchange right IJ tunneled dialysis catheter over wire same access   PRE-OPERATIVE DIAGNOSIS: Complication of dialysis device with nonfunction of tunneled catheter; end-stage renal requiring hemodialysis  POST-OPERATIVE DIAGNOSIS: same as above  SURGEON: Katha Cabal, M.D.  ANESTHESIA: Conscious sedation was administered under my direct supervision. IV Versed plus fentanyl were utilized. Continuous ECG, pulse oximetry and blood pressure was monitored throughout the entire procedure.  Versed and Fentanyl were utilized.  Conscious sedation was for a total of 20 minutes.  ESTIMATED BLOOD LOSS: Minimal  FINDING(S): 1.  Tips of the catheter in the right atrium on fluoroscopy 2.  No obvious pneumothorax on fluoroscopy  SPECIMEN(S):  none  INDICATIONS:   [name] is a 65 y.o. female  presents with end stage renal disease.  Therefore, the patient requires a tunneled dialysis catheter placement.  The patient is informed of  the risks catheter placement include but are not limited to: bleeding, infection, central venous injury, pneumothorax, possible venous stenosis, possible malpositioning in the venous system, and possible infections related to long-term catheter presence.  The patient was aware of these risks and agreed to proceed.  DESCRIPTION: The patient was taken back to Special Procedure suite.  Prior to sedation, the patient was given IV antibiotics.  After obtaining adequate sedation, the patient was prepped and draped in the standard fashion for a chest or neck tunneled dialysis catheter placement.  Appropriate Time Out is called.   The the right neck and chest wall are then infiltrated with 1% Lidocaine with epinepherine.  A 19 cm tip to cuff palindrome catheter is then selected, opened on the back table and prepped.  The cuff is then freed from surrounding attachments. The catheter is then  controlled with a hemostat and transected above the level of the cuff.  Under fluoroscopy an Amplatz Super Stiff wire is introduced through the catheter and negotiated into the inferior vena cava. The remaining portion of the catheter is then removed without difficulty.  A new catheter is then threaded over the wire and advanced under fluoroscopy so that the tip is in the mid atrium.  Each port was tested by aspirating and flushing.  No resistance was noted.  Each port was then thoroughly flushed with heparinized saline.  The catheter was secured in placed with two interrupted stitches of 0 silk tied to the catheter.   Each port was then packed with concentrated heparin (10,000 Units/mL) at the manufacturer recommended volumes to each port.  Sterile caps were applied to each port.  On completion fluoroscopy, the tips of the catheter were in the right atrium, and there was no evidence of pneumothorax.  COMPLICATIONS: None  CONDITION: [name] 12/25/2015,3:54 PM [facility] Office: [phone]   12/25/2015, 3:54 PM

## 2015-12-25 NOTE — H&P (Signed)
[facility] History & Physical Update  The patient was interviewed and re-examined.  The patient's previous History and Physical has been reviewed and is unchanged.  There is no change in the plan of care. We plan to proceed with the scheduled procedure.  Hortencia Pilar, MD  12/25/2015, 3:51 PM

## 2015-12-25 NOTE — H&P (Signed)
[facility] History & Physical Update  The patient was interviewed and re-examined.  The patient's previous History and Physical has been reviewed and is unchanged.  There is no change in the plan of care. We plan to proceed with the scheduled procedure.  Hortencia Pilar, MD  12/25/2015, 3:52 PM

## 2015-12-26 ENCOUNTER — Encounter: Payer: Self-pay | Admitting: Vascular Surgery

## 2015-12-26 DIAGNOSIS — N186 End stage renal disease: Secondary | ICD-10-CM | POA: Diagnosis not present

## 2015-12-26 DIAGNOSIS — D631 Anemia in chronic kidney disease: Secondary | ICD-10-CM | POA: Diagnosis not present

## 2015-12-26 DIAGNOSIS — D509 Iron deficiency anemia, unspecified: Secondary | ICD-10-CM | POA: Diagnosis not present

## 2015-12-26 DIAGNOSIS — N2581 Secondary hyperparathyroidism of renal origin: Secondary | ICD-10-CM | POA: Diagnosis not present

## 2015-12-26 DIAGNOSIS — Z992 Dependence on renal dialysis: Secondary | ICD-10-CM | POA: Diagnosis not present

## 2015-12-26 DIAGNOSIS — Z23 Encounter for immunization: Secondary | ICD-10-CM | POA: Diagnosis not present

## 2015-12-26 MED ORDER — HEPARIN (PORCINE) IN NACL 2-0.9 UNIT/ML-% IJ SOLN
INTRAMUSCULAR | Status: AC
  Filled 2015-12-26: qty 1000

## 2015-12-26 MED ORDER — LIDOCAINE-EPINEPHRINE (PF) 1 %-1:200000 IJ SOLN
INTRAMUSCULAR | Status: AC
  Filled 2015-12-26: qty 30

## 2015-12-26 NOTE — Pre-Procedure Instructions (Signed)
Met B and MRSA results faxed to Dr. Delana Meyer office.

## 2015-12-28 DIAGNOSIS — N2581 Secondary hyperparathyroidism of renal origin: Secondary | ICD-10-CM | POA: Diagnosis not present

## 2015-12-28 DIAGNOSIS — N186 End stage renal disease: Secondary | ICD-10-CM | POA: Diagnosis not present

## 2015-12-28 DIAGNOSIS — Z23 Encounter for immunization: Secondary | ICD-10-CM | POA: Diagnosis not present

## 2015-12-28 DIAGNOSIS — D509 Iron deficiency anemia, unspecified: Secondary | ICD-10-CM | POA: Diagnosis not present

## 2015-12-28 DIAGNOSIS — Z992 Dependence on renal dialysis: Secondary | ICD-10-CM | POA: Diagnosis not present

## 2015-12-28 DIAGNOSIS — D631 Anemia in chronic kidney disease: Secondary | ICD-10-CM | POA: Diagnosis not present

## 2015-12-30 DIAGNOSIS — D225 Melanocytic nevi of trunk: Secondary | ICD-10-CM | POA: Diagnosis not present

## 2015-12-30 DIAGNOSIS — D2272 Melanocytic nevi of left lower limb, including hip: Secondary | ICD-10-CM | POA: Diagnosis not present

## 2015-12-30 DIAGNOSIS — D485 Neoplasm of uncertain behavior of skin: Secondary | ICD-10-CM | POA: Diagnosis not present

## 2015-12-30 DIAGNOSIS — L821 Other seborrheic keratosis: Secondary | ICD-10-CM | POA: Diagnosis not present

## 2015-12-30 DIAGNOSIS — L570 Actinic keratosis: Secondary | ICD-10-CM | POA: Diagnosis not present

## 2015-12-30 DIAGNOSIS — X32XXXA Exposure to sunlight, initial encounter: Secondary | ICD-10-CM | POA: Diagnosis not present

## 2015-12-30 DIAGNOSIS — D2261 Melanocytic nevi of right upper limb, including shoulder: Secondary | ICD-10-CM | POA: Diagnosis not present

## 2015-12-30 DIAGNOSIS — Z85828 Personal history of other malignant neoplasm of skin: Secondary | ICD-10-CM | POA: Diagnosis not present

## 2015-12-31 DIAGNOSIS — D509 Iron deficiency anemia, unspecified: Secondary | ICD-10-CM | POA: Diagnosis not present

## 2015-12-31 DIAGNOSIS — Z992 Dependence on renal dialysis: Secondary | ICD-10-CM | POA: Diagnosis not present

## 2015-12-31 DIAGNOSIS — E8770 Fluid overload, unspecified: Secondary | ICD-10-CM | POA: Diagnosis not present

## 2015-12-31 DIAGNOSIS — N2581 Secondary hyperparathyroidism of renal origin: Secondary | ICD-10-CM | POA: Diagnosis not present

## 2015-12-31 DIAGNOSIS — E8779 Other fluid overload: Secondary | ICD-10-CM | POA: Diagnosis not present

## 2015-12-31 DIAGNOSIS — Z23 Encounter for immunization: Secondary | ICD-10-CM | POA: Diagnosis not present

## 2015-12-31 DIAGNOSIS — N186 End stage renal disease: Secondary | ICD-10-CM | POA: Diagnosis not present

## 2015-12-31 DIAGNOSIS — D631 Anemia in chronic kidney disease: Secondary | ICD-10-CM | POA: Diagnosis not present

## 2016-01-01 DIAGNOSIS — Z992 Dependence on renal dialysis: Secondary | ICD-10-CM | POA: Diagnosis not present

## 2016-01-01 DIAGNOSIS — N186 End stage renal disease: Secondary | ICD-10-CM | POA: Diagnosis not present

## 2016-01-01 DIAGNOSIS — N2581 Secondary hyperparathyroidism of renal origin: Secondary | ICD-10-CM | POA: Diagnosis not present

## 2016-01-01 DIAGNOSIS — E038 Other specified hypothyroidism: Secondary | ICD-10-CM | POA: Diagnosis not present

## 2016-01-01 DIAGNOSIS — E063 Autoimmune thyroiditis: Secondary | ICD-10-CM | POA: Diagnosis not present

## 2016-01-01 DIAGNOSIS — Z23 Encounter for immunization: Secondary | ICD-10-CM | POA: Diagnosis not present

## 2016-01-01 DIAGNOSIS — D509 Iron deficiency anemia, unspecified: Secondary | ICD-10-CM | POA: Diagnosis not present

## 2016-01-01 DIAGNOSIS — D631 Anemia in chronic kidney disease: Secondary | ICD-10-CM | POA: Diagnosis not present

## 2016-01-02 DIAGNOSIS — D631 Anemia in chronic kidney disease: Secondary | ICD-10-CM | POA: Diagnosis not present

## 2016-01-02 DIAGNOSIS — D509 Iron deficiency anemia, unspecified: Secondary | ICD-10-CM | POA: Diagnosis not present

## 2016-01-02 DIAGNOSIS — N2581 Secondary hyperparathyroidism of renal origin: Secondary | ICD-10-CM | POA: Diagnosis not present

## 2016-01-02 DIAGNOSIS — Z992 Dependence on renal dialysis: Secondary | ICD-10-CM | POA: Diagnosis not present

## 2016-01-02 DIAGNOSIS — Z23 Encounter for immunization: Secondary | ICD-10-CM | POA: Diagnosis not present

## 2016-01-02 DIAGNOSIS — N186 End stage renal disease: Secondary | ICD-10-CM | POA: Diagnosis not present

## 2016-01-04 DIAGNOSIS — D631 Anemia in chronic kidney disease: Secondary | ICD-10-CM | POA: Diagnosis not present

## 2016-01-04 DIAGNOSIS — N186 End stage renal disease: Secondary | ICD-10-CM | POA: Diagnosis not present

## 2016-01-04 DIAGNOSIS — Z23 Encounter for immunization: Secondary | ICD-10-CM | POA: Diagnosis not present

## 2016-01-04 DIAGNOSIS — D509 Iron deficiency anemia, unspecified: Secondary | ICD-10-CM | POA: Diagnosis not present

## 2016-01-04 DIAGNOSIS — N2581 Secondary hyperparathyroidism of renal origin: Secondary | ICD-10-CM | POA: Diagnosis not present

## 2016-01-04 DIAGNOSIS — Z992 Dependence on renal dialysis: Secondary | ICD-10-CM | POA: Diagnosis not present

## 2016-01-07 DIAGNOSIS — N2581 Secondary hyperparathyroidism of renal origin: Secondary | ICD-10-CM | POA: Diagnosis not present

## 2016-01-07 DIAGNOSIS — Z992 Dependence on renal dialysis: Secondary | ICD-10-CM | POA: Diagnosis not present

## 2016-01-07 DIAGNOSIS — D509 Iron deficiency anemia, unspecified: Secondary | ICD-10-CM | POA: Diagnosis not present

## 2016-01-07 DIAGNOSIS — N186 End stage renal disease: Secondary | ICD-10-CM | POA: Diagnosis not present

## 2016-01-07 DIAGNOSIS — Z23 Encounter for immunization: Secondary | ICD-10-CM | POA: Diagnosis not present

## 2016-01-07 DIAGNOSIS — D631 Anemia in chronic kidney disease: Secondary | ICD-10-CM | POA: Diagnosis not present

## 2016-01-08 ENCOUNTER — Other Ambulatory Visit: Payer: Self-pay | Admitting: Family Medicine

## 2016-01-08 ENCOUNTER — Ambulatory Visit
Admission: RE | Admit: 2016-01-08 | Discharge: 2016-01-08 | Disposition: A | Discharge status: Home / Self Care | Payer: Medicare Other | Source: Ambulatory Visit | Attending: Family Medicine | Admitting: Family Medicine

## 2016-01-08 DIAGNOSIS — Z1231 Encounter for screening mammogram for malignant neoplasm of breast: Secondary | ICD-10-CM

## 2016-01-09 DIAGNOSIS — N2581 Secondary hyperparathyroidism of renal origin: Secondary | ICD-10-CM | POA: Diagnosis not present

## 2016-01-09 DIAGNOSIS — D631 Anemia in chronic kidney disease: Secondary | ICD-10-CM | POA: Diagnosis not present

## 2016-01-09 DIAGNOSIS — Z23 Encounter for immunization: Secondary | ICD-10-CM | POA: Diagnosis not present

## 2016-01-09 DIAGNOSIS — D509 Iron deficiency anemia, unspecified: Secondary | ICD-10-CM | POA: Diagnosis not present

## 2016-01-09 DIAGNOSIS — N186 End stage renal disease: Secondary | ICD-10-CM | POA: Diagnosis not present

## 2016-01-09 DIAGNOSIS — Z992 Dependence on renal dialysis: Secondary | ICD-10-CM | POA: Diagnosis not present

## 2016-01-10 ENCOUNTER — Ambulatory Visit: Payer: Medicare Other | Admitting: Anesthesiology

## 2016-01-10 ENCOUNTER — Ambulatory Visit
Admission: RE | Admit: 2016-01-10 | Discharge: 2016-01-10 | Disposition: A | Discharge status: Home / Self Care | Payer: Medicare Other | Source: Ambulatory Visit | Attending: Vascular Surgery | Admitting: Vascular Surgery

## 2016-01-10 ENCOUNTER — Encounter
Admission: RE | Disposition: A | Discharge status: Home / Self Care | Payer: Self-pay | Source: Ambulatory Visit | Attending: Vascular Surgery

## 2016-01-10 ENCOUNTER — Encounter: Payer: Self-pay | Admitting: *Deleted

## 2016-01-10 DIAGNOSIS — Z7951 Long term (current) use of inhaled steroids: Secondary | ICD-10-CM | POA: Diagnosis not present

## 2016-01-10 DIAGNOSIS — I509 Heart failure, unspecified: Secondary | ICD-10-CM | POA: Insufficient documentation

## 2016-01-10 DIAGNOSIS — J449 Chronic obstructive pulmonary disease, unspecified: Secondary | ICD-10-CM | POA: Insufficient documentation

## 2016-01-10 DIAGNOSIS — Z992 Dependence on renal dialysis: Secondary | ICD-10-CM | POA: Insufficient documentation

## 2016-01-10 DIAGNOSIS — N186 End stage renal disease: Secondary | ICD-10-CM | POA: Diagnosis not present

## 2016-01-10 DIAGNOSIS — Z79899 Other long term (current) drug therapy: Secondary | ICD-10-CM | POA: Diagnosis not present

## 2016-01-10 DIAGNOSIS — Z87891 Personal history of nicotine dependence: Secondary | ICD-10-CM | POA: Diagnosis not present

## 2016-01-10 DIAGNOSIS — I132 Hypertensive heart and chronic kidney disease with heart failure and with stage 5 chronic kidney disease, or end stage renal disease: Secondary | ICD-10-CM | POA: Diagnosis not present

## 2016-01-10 DIAGNOSIS — I120 Hypertensive chronic kidney disease with stage 5 chronic kidney disease or end stage renal disease: Secondary | ICD-10-CM | POA: Diagnosis not present

## 2016-01-10 DIAGNOSIS — I739 Peripheral vascular disease, unspecified: Secondary | ICD-10-CM | POA: Diagnosis not present

## 2016-01-10 DIAGNOSIS — I2510 Atherosclerotic heart disease of native coronary artery without angina pectoris: Secondary | ICD-10-CM | POA: Diagnosis not present

## 2016-01-10 DIAGNOSIS — E785 Hyperlipidemia, unspecified: Secondary | ICD-10-CM | POA: Diagnosis not present

## 2016-01-10 DIAGNOSIS — Z7982 Long term (current) use of aspirin: Secondary | ICD-10-CM | POA: Insufficient documentation

## 2016-01-10 HISTORY — PX: AV FISTULA PLACEMENT: SHX1204

## 2016-01-10 LAB — POCT I-STAT 4, (NA,K, GLUC, HGB,HCT)
GLUCOSE: 78 mg/dL (ref 65–99)
HEMATOCRIT: 40 % (ref 36.0–46.0)
Hemoglobin: 13.6 g/dL (ref 12.0–15.0)
POTASSIUM: 4 mmol/L (ref 3.5–5.1)
SODIUM: 138 mmol/L (ref 135–145)

## 2016-01-10 LAB — TYPE AND SCREEN
ABO/RH(D): B POS
ANTIBODY SCREEN: NEGATIVE

## 2016-01-10 SURGERY — INSERTION OF ARTERIOVENOUS (AV) GORE-TEX GRAFT ARM
Anesthesia: General | Laterality: Left | Wound class: Clean

## 2016-01-10 MED ORDER — PROPOFOL 10 MG/ML IV BOLUS
INTRAVENOUS | Status: DC | PRN
  Administered 2016-01-10: 100 mg via INTRAVENOUS
  Administered 2016-01-10 (×2): 50 mg via INTRAVENOUS

## 2016-01-10 MED ORDER — BUPIVACAINE HCL (PF) 0.5 % IJ SOLN
INTRAMUSCULAR | Status: DC | PRN
  Administered 2016-01-10: 18 mL

## 2016-01-10 MED ORDER — OXYCODONE HCL 5 MG PO TABS: 5.0000 mg | ORAL_TABLET | Freq: Once | ORAL | Status: DC | PRN

## 2016-01-10 MED ORDER — OXYCODONE HCL 5 MG/5ML PO SOLN: 5.0000 mg | Freq: Once | ORAL | Status: DC | PRN

## 2016-01-10 MED ORDER — CHLORHEXIDINE GLUCONATE CLOTH 2 % EX PADS: 6.0000 | MEDICATED_PAD | Freq: Once | CUTANEOUS | Status: DC

## 2016-01-10 MED ORDER — VANCOMYCIN HCL IN DEXTROSE 1-5 GM/200ML-% IV SOLN
1000.0000 mg | Freq: Once | INTRAVENOUS | Status: AC
  Administered 2016-01-10: 1000 mg via INTRAVENOUS

## 2016-01-10 MED ORDER — OXYCODONE-ACETAMINOPHEN 5-325 MG PO TABS: 1.0000 | ORAL_TABLET | Freq: Four times a day (QID) | ORAL | 0 refills | Status: DC | PRN

## 2016-01-10 MED ORDER — PAPAVERINE HCL 30 MG/ML IJ SOLN
INTRAMUSCULAR | Status: AC
  Filled 2016-01-10: qty 2

## 2016-01-10 MED ORDER — CEFAZOLIN SODIUM-DEXTROSE 2-4 GM/100ML-% IV SOLN: 2.0000 g | INTRAVENOUS | Status: DC

## 2016-01-10 MED ORDER — ONDANSETRON HCL 4 MG/2ML IJ SOLN
INTRAMUSCULAR | Status: DC | PRN
  Administered 2016-01-10: 4 mg via INTRAVENOUS

## 2016-01-10 MED ORDER — LIDOCAINE HCL (CARDIAC) 20 MG/ML IV SOLN
INTRAVENOUS | Status: DC | PRN
  Administered 2016-01-10: 60 mg via INTRAVENOUS

## 2016-01-10 MED ORDER — PHENYLEPHRINE HCL 10 MG/ML IJ SOLN
INTRAMUSCULAR | Status: DC | PRN
  Administered 2016-01-10 (×6): 100 ug via INTRAVENOUS

## 2016-01-10 MED ORDER — EPHEDRINE SULFATE 50 MG/ML IJ SOLN
INTRAMUSCULAR | Status: DC | PRN
  Administered 2016-01-10 (×3): 10 mg via INTRAVENOUS
  Administered 2016-01-10: 15 mg via INTRAVENOUS
  Administered 2016-01-10: 5 mg via INTRAVENOUS

## 2016-01-10 MED ORDER — HEPARIN SODIUM (PORCINE) 5000 UNIT/ML IJ SOLN
INTRAMUSCULAR | Status: AC
  Filled 2016-01-10: qty 1

## 2016-01-10 MED ORDER — CEFAZOLIN SODIUM-DEXTROSE 2-4 GM/100ML-% IV SOLN
INTRAVENOUS | Status: AC
  Filled 2016-01-10: qty 100

## 2016-01-10 MED ORDER — SODIUM CHLORIDE 0.9 % IV SOLN
INTRAVENOUS | Status: DC
  Administered 2016-01-10 (×2): via INTRAVENOUS

## 2016-01-10 MED ORDER — FENTANYL CITRATE (PF) 100 MCG/2ML IJ SOLN: 25.0000 ug | INTRAMUSCULAR | Status: DC | PRN

## 2016-01-10 MED ORDER — BUPIVACAINE HCL (PF) 0.5 % IJ SOLN
INTRAMUSCULAR | Status: AC
  Filled 2016-01-10: qty 30

## 2016-01-10 MED ORDER — FENTANYL CITRATE (PF) 100 MCG/2ML IJ SOLN
INTRAMUSCULAR | Status: DC | PRN
  Administered 2016-01-10 (×3): 50 ug via INTRAVENOUS

## 2016-01-10 MED ORDER — VANCOMYCIN HCL IN DEXTROSE 1-5 GM/200ML-% IV SOLN
INTRAVENOUS | Status: AC
  Filled 2016-01-10: qty 200

## 2016-01-10 SURGICAL SUPPLY — 66 items
ADH SKN CLS APL DERMABOND .7 (GAUZE/BANDAGES/DRESSINGS) ×1
APPLIER CLIP 11 MED OPEN (CLIP)
APPLIER CLIP 9.375 SM OPEN (CLIP)
APR CLP MED 11 20 MLT OPN (CLIP)
APR CLP SM 9.3 20 MLT OPN (CLIP)
BAG COUNTER SPONGE EZ (MISCELLANEOUS) ×1 IMPLANT
BAG DECANTER FOR FLEXI CONT (MISCELLANEOUS) ×3 IMPLANT
BAG SPNG 4X4 CLR HAZ (MISCELLANEOUS)
BLADE SURG SZ11 CARB STEEL (BLADE) ×3 IMPLANT
BOOT SUTURE AID YELLOW STND (SUTURE) ×3 IMPLANT
BRUSH SCRUB 4% CHG (MISCELLANEOUS) ×3 IMPLANT
CANISTER SUCT 1200ML W/VALVE (MISCELLANEOUS) ×3 IMPLANT
CHLORAPREP W/TINT 26ML (MISCELLANEOUS) ×3 IMPLANT
CLIP APPLIE 11 MED OPEN (CLIP) IMPLANT
CLIP APPLIE 9.375 SM OPEN (CLIP) IMPLANT
COUNTER SPONGE BAG EZ (MISCELLANEOUS)
DERMABOND ADVANCED (GAUZE/BANDAGES/DRESSINGS) ×2
DERMABOND ADVANCED .7 DNX12 (GAUZE/BANDAGES/DRESSINGS) IMPLANT
DRESSING SURGICEL FIBRLLR 1X2 (HEMOSTASIS) ×1 IMPLANT
DRSG SURGICEL FIBRILLAR 1X2 (HEMOSTASIS) ×3
ELECT CAUTERY BLADE 6.4 (BLADE) ×3 IMPLANT
ELECT REM PT RETURN 9FT ADLT (ELECTROSURGICAL) ×3
ELECTRODE REM PT RTRN 9FT ADLT (ELECTROSURGICAL) ×1 IMPLANT
GLOVE BIO SURGEON STRL SZ7 (GLOVE) ×5 IMPLANT
GLOVE INDICATOR 7.5 STRL GRN (GLOVE) ×5 IMPLANT
GLOVE SURG SYN 8.0 (GLOVE) ×6 IMPLANT
GLOVE SURG SYN 8.0 PF PI (GLOVE) ×1 IMPLANT
GOWN STRL REUS W/ TWL LRG LVL3 (GOWN DISPOSABLE) ×2 IMPLANT
GOWN STRL REUS W/ TWL XL LVL3 (GOWN DISPOSABLE) ×1 IMPLANT
GOWN STRL REUS W/TWL LRG LVL3 (GOWN DISPOSABLE) ×6
GOWN STRL REUS W/TWL XL LVL3 (GOWN DISPOSABLE) ×3
GRAFT VASC 4-7X45  TAPER (Graft) ×2 IMPLANT
IV NS 500ML (IV SOLUTION) ×3
IV NS 500ML BAXH (IV SOLUTION) ×1 IMPLANT
KIT RM TURNOVER STRD PROC AR (KITS) ×3 IMPLANT
LABEL OR SOLS (LABEL) ×3 IMPLANT
LIQUID BAND (GAUZE/BANDAGES/DRESSINGS) ×1 IMPLANT
LOOP RED MAXI  1X406MM (MISCELLANEOUS) ×4
LOOP VESSEL MAXI 1X406 RED (MISCELLANEOUS) ×1 IMPLANT
LOOP VESSEL MINI 0.8X406 BLUE (MISCELLANEOUS) ×2 IMPLANT
LOOPS BLUE MINI 0.8X406MM (MISCELLANEOUS) ×2
NDL FILTER BLUNT 18X1 1/2 (NEEDLE) ×1 IMPLANT
NEEDLE FILTER BLUNT 18X 1/2SAF (NEEDLE) ×2
NEEDLE FILTER BLUNT 18X1 1/2 (NEEDLE) ×1 IMPLANT
NS IRRIG 500ML POUR BTL (IV SOLUTION) ×3 IMPLANT
PACK EXTREMITY ARMC (MISCELLANEOUS) ×3 IMPLANT
PAD PREP 24X41 OB/GYN DISP (PERSONAL CARE ITEMS) ×3 IMPLANT
PUNCH SURGICAL ROTATE 2.7MM (MISCELLANEOUS) IMPLANT
STOCKINETTE 48X4 2 PLY STRL (GAUZE/BANDAGES/DRESSINGS) ×1 IMPLANT
STOCKINETTE STRL 4IN 9604848 (GAUZE/BANDAGES/DRESSINGS) ×3 IMPLANT
SUT GTX CV-6 30 (SUTURE) ×6 IMPLANT
SUT GTX CV-7 30 3/8 TAPER (SUTURE) ×2 IMPLANT
SUT MNCRL+ 5-0 UNDYED PC-3 (SUTURE) ×1 IMPLANT
SUT MONOCRYL 5-0 (SUTURE) ×2
SUT PROLENE 6 0 BV (SUTURE) ×6 IMPLANT
SUT SILK 2 0 (SUTURE) ×3
SUT SILK 2 0 SH (SUTURE) ×3 IMPLANT
SUT SILK 2-0 18XBRD TIE 12 (SUTURE) ×1 IMPLANT
SUT SILK 3 0 (SUTURE) ×3
SUT SILK 3-0 18XBRD TIE 12 (SUTURE) ×1 IMPLANT
SUT SILK 4 0 (SUTURE) ×3
SUT SILK 4-0 18XBRD TIE 12 (SUTURE) ×1 IMPLANT
SUT VIC AB 3-0 SH 27 (SUTURE) ×6
SUT VIC AB 3-0 SH 27X BRD (SUTURE) ×2 IMPLANT
SYR 20CC LL (SYRINGE) ×3 IMPLANT
SYR 3ML LL SCALE MARK (SYRINGE) ×3 IMPLANT

## 2016-01-10 NOTE — Anesthesia Procedure Notes (Signed)
Procedure Name: LMA Insertion Date/Time: 01/10/2016 7:43 AM Performed by: Leander Rams Pre-anesthesia Checklist: Patient identified, Emergency Drugs available, Suction available, Patient being monitored and Timeout performed Patient Re-evaluated:Patient Re-evaluated prior to inductionOxygen Delivery Method: Circle system utilized Preoxygenation: Pre-oxygenation with 100% oxygen Intubation Type: IV induction LMA: LMA inserted LMA Size: 4.0 Number of attempts: 1 Placement Confirmation: positive ETCO2,  CO2 detector and breath sounds checked- equal and bilateral

## 2016-01-10 NOTE — Anesthesia Postprocedure Evaluation (Signed)
Anesthesia Post Note  Patient: JAMALA KOHEN  Procedure(s) Performed: Procedure(s) (LRB): INSERTION OF ARTERIOVENOUS (AV) GORE-TEX GRAFT ARM ( BRACH / AXILLARY ) (Left)  Patient location during evaluation: PACU Anesthesia Type: General Level of consciousness: awake and alert Pain management: pain level controlled Vital Signs Assessment: post-procedure vital signs reviewed and stable Respiratory status: spontaneous breathing, nonlabored ventilation, respiratory function stable and patient connected to nasal cannula oxygen Cardiovascular status: blood pressure returned to baseline and stable Postop Assessment: no signs of nausea or vomiting Anesthetic complications: no    Last Vitals:  Vitals:   01/10/16 1046 01/10/16 1111  BP: (!) 132/47 (!) 132/49  Pulse: 88 83  Resp: 16 18  Temp: 36.6 C     Last Pain:  Vitals:   01/10/16 1113  TempSrc:   PainSc: 2                  Precious Haws Casie Sturgeon

## 2016-01-10 NOTE — Transfer of Care (Signed)
Immediate Anesthesia Transfer of Care Note  Patient: NANSI [place]  Procedure(s) Performed: Procedure(s): INSERTION OF ARTERIOVENOUS (AV) GORE-TEX GRAFT ARM ( BRACH / AXILLARY ) (Left)  Patient Location: PACU  Anesthesia Type:General  Level of Consciousness: awake  Airway & Oxygen Therapy: Patient Spontanous Breathing  Post-op Assessment: Report given to RN  Post vital signs: stable  Last Vitals:  Vitals:   01/10/16 0623 01/10/16 0951  BP: (!) 143/63 (!) 134/56  Pulse: 73 93  Resp: 14 17  Temp: 36.5 C 37.2 C    Last Pain:  Vitals:   01/10/16 0623  TempSrc: Oral         Complications: No apparent anesthesia complications
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
## 2016-01-10 NOTE — H&P (Signed)
[facility] History & Physical Update  The patient was interviewed and re-examined.  The patient's previous History and Physical has been reviewed and is unchanged.  There is no change in the plan of care. We plan to proceed with the scheduled procedure.  Hortencia Pilar, MD  01/10/2016, 7:30 AM

## 2016-01-10 NOTE — Discharge Instructions (Signed)

## 2016-01-10 NOTE — Op Note (Signed)
OPERATIVE NOTE   PROCEDURE: left brachial axillary arteriovenous graft placement  PRE-OPERATIVE DIAGNOSIS: End Stage Renal Disease  POST-OPERATIVE DIAGNOSIS: End Stage Renal Disease  SURGEON: Hortencia Pilar  ASSISTANT(S): Ms. Hezzie Bump  ANESTHESIA: general  ESTIMATED BLOOD LOSS: <50 cc  FINDING(S): 3 mm brachial artery 6 mm axillary vein  SPECIMEN(S):  none  INDICATIONS:   [name] is a 65 y.o. female who presents with end stage renal disease.  The patient is scheduled for left brachial axillary AV graft placement.  The patient is aware the risks include but are not limited to: bleeding, infection, steal syndrome, nerve damage, ischemic monomelic neuropathy, failure to mature, and need for additional procedures.  The patient is aware of the risks of the procedure and elects to proceed forward.  DESCRIPTION: After full informed written consent was obtained from the patient, the patient was brought back to the operating room and placed supine upon the operating table.  Prior to induction, the patient received IV antibiotics.   After obtaining adequate anesthesia, the patient was then prepped and draped in the standard fashion for a left arm access procedure.    A linear incision was then created along the medial border of the biceps muscle just proximal to the antecubital crease and the brachial artery which was exposed through. The brachial artery was then looped proximally and distally with Silastic Vesseloops. Side branches were controlled with 4-0 silk ties.  Attention was then turned to the exposure of the axillary vein. Linear incision was then created medial to the proximal portion of the biceps at the level of the anterior axillary crease. The axillary vein was exposed and again looped proximally and distally with Silastic vessel loops. Associated tributaries were also controlled with Silastic Vesseloops.  The Gore tunneler was then delivered onto the  field and a subcutaneous path was made from the arterial incision to the venous incision. A 4-7 tapered PTFE Atrium graft (patient has a documented heparin antibody and therefore a non-pro patent PTFE graft was selected) was then pulled through the subcutaneous tunnel. The arterial 4 mm portion was then approximated to the brachial artery. Brachial artery was controlled proximally and distally with the Silastic Vesseloops. Arteriotomy was made with an 11 blade scalpel and extended with Potts scissors and a 6-0 Prolene stay suture was placed. End graft to side brachial artery anastomosis was then fashioned with running CV 6 suture. Flushing maneuvers were performed suture line was hemostatic and the graft was then assessed for proper position and ease of future cannulation. Heparinized saline was infused into the vein and the graft was clamped with a vascular clamp. With the graft pressurized it was approximated to the axillary vein in its native bed and then marked with a surgical marker. The vein was then delivered into the surgical field and controlled with the Silastic vessel loops. Venotomy was then made with an 11 blade scalpel and extended with Potts scissors and a 6-0 Prolene suture was used as stay suture. The the graft was then sewn to the vein in an end graft to side vein fashion using running CV 6 suture.  Flushing maneuvers were performed and the artery was allowed to forward and back bleed.  Flow was then established through the AV graft  There was good  thrill in the venous outflow, and there was 1+ palpable radial pulse.  At this point, I irrigated out the surgical wounds.  There was no further active bleeding.  The subcutaneous tissue  was reapproximated with a running stitch of 3-0 Vicryl.  The skin was then reapproximated with a running subcuticular stitch of 4-0 Vicryl.  The skin was then cleaned, dried, and reinforced with Dermabond.    The patient tolerated this procedure well.    COMPLICATIONS: None  CONDITION: [name]  [facility]  Office: [phone]   01/10/2016, 9:28 AM

## 2016-01-11 DIAGNOSIS — D509 Iron deficiency anemia, unspecified: Secondary | ICD-10-CM | POA: Diagnosis not present

## 2016-01-11 DIAGNOSIS — D631 Anemia in chronic kidney disease: Secondary | ICD-10-CM | POA: Diagnosis not present

## 2016-01-11 DIAGNOSIS — Z992 Dependence on renal dialysis: Secondary | ICD-10-CM | POA: Diagnosis not present

## 2016-01-11 DIAGNOSIS — N2581 Secondary hyperparathyroidism of renal origin: Secondary | ICD-10-CM | POA: Diagnosis not present

## 2016-01-11 DIAGNOSIS — N186 End stage renal disease: Secondary | ICD-10-CM | POA: Diagnosis not present

## 2016-01-11 DIAGNOSIS — Z23 Encounter for immunization: Secondary | ICD-10-CM | POA: Diagnosis not present

## 2016-01-13 ENCOUNTER — Encounter: Payer: Self-pay | Admitting: Vascular Surgery

## 2016-01-14 ENCOUNTER — Other Ambulatory Visit: Payer: Self-pay | Admitting: *Deleted

## 2016-01-14 ENCOUNTER — Inpatient Hospital Stay
Admission: RE | Admit: 2016-01-14 | Discharge: 2016-01-14 | Disposition: A | Discharge status: Home / Self Care | Payer: Self-pay | Source: Ambulatory Visit | Attending: *Deleted | Admitting: *Deleted

## 2016-01-14 DIAGNOSIS — D631 Anemia in chronic kidney disease: Secondary | ICD-10-CM | POA: Diagnosis not present

## 2016-01-14 DIAGNOSIS — D509 Iron deficiency anemia, unspecified: Secondary | ICD-10-CM | POA: Diagnosis not present

## 2016-01-14 DIAGNOSIS — N2581 Secondary hyperparathyroidism of renal origin: Secondary | ICD-10-CM | POA: Diagnosis not present

## 2016-01-14 DIAGNOSIS — Z9289 Personal history of other medical treatment: Secondary | ICD-10-CM

## 2016-01-14 DIAGNOSIS — Z992 Dependence on renal dialysis: Secondary | ICD-10-CM | POA: Diagnosis not present

## 2016-01-14 DIAGNOSIS — Z23 Encounter for immunization: Secondary | ICD-10-CM | POA: Diagnosis not present

## 2016-01-14 DIAGNOSIS — N186 End stage renal disease: Secondary | ICD-10-CM | POA: Diagnosis not present

## 2016-01-16 DIAGNOSIS — Z23 Encounter for immunization: Secondary | ICD-10-CM | POA: Diagnosis not present

## 2016-01-16 DIAGNOSIS — D631 Anemia in chronic kidney disease: Secondary | ICD-10-CM | POA: Diagnosis not present

## 2016-01-16 DIAGNOSIS — N2581 Secondary hyperparathyroidism of renal origin: Secondary | ICD-10-CM | POA: Diagnosis not present

## 2016-01-16 DIAGNOSIS — N186 End stage renal disease: Secondary | ICD-10-CM | POA: Diagnosis not present

## 2016-01-16 DIAGNOSIS — D509 Iron deficiency anemia, unspecified: Secondary | ICD-10-CM | POA: Diagnosis not present

## 2016-01-16 DIAGNOSIS — Z992 Dependence on renal dialysis: Secondary | ICD-10-CM | POA: Diagnosis not present

## 2016-01-18 DIAGNOSIS — Z992 Dependence on renal dialysis: Secondary | ICD-10-CM | POA: Diagnosis not present

## 2016-01-18 DIAGNOSIS — D509 Iron deficiency anemia, unspecified: Secondary | ICD-10-CM | POA: Diagnosis not present

## 2016-01-18 DIAGNOSIS — N186 End stage renal disease: Secondary | ICD-10-CM | POA: Diagnosis not present

## 2016-01-18 DIAGNOSIS — N2581 Secondary hyperparathyroidism of renal origin: Secondary | ICD-10-CM | POA: Diagnosis not present

## 2016-01-18 DIAGNOSIS — Z23 Encounter for immunization: Secondary | ICD-10-CM | POA: Diagnosis not present

## 2016-01-18 DIAGNOSIS — D631 Anemia in chronic kidney disease: Secondary | ICD-10-CM | POA: Diagnosis not present

## 2016-01-21 DIAGNOSIS — Z23 Encounter for immunization: Secondary | ICD-10-CM | POA: Diagnosis not present

## 2016-01-21 DIAGNOSIS — N2581 Secondary hyperparathyroidism of renal origin: Secondary | ICD-10-CM | POA: Diagnosis not present

## 2016-01-21 DIAGNOSIS — D631 Anemia in chronic kidney disease: Secondary | ICD-10-CM | POA: Diagnosis not present

## 2016-01-21 DIAGNOSIS — N186 End stage renal disease: Secondary | ICD-10-CM | POA: Diagnosis not present

## 2016-01-21 DIAGNOSIS — Z992 Dependence on renal dialysis: Secondary | ICD-10-CM | POA: Diagnosis not present

## 2016-01-21 DIAGNOSIS — D509 Iron deficiency anemia, unspecified: Secondary | ICD-10-CM | POA: Diagnosis not present

## 2016-01-23 DIAGNOSIS — Z23 Encounter for immunization: Secondary | ICD-10-CM | POA: Diagnosis not present

## 2016-01-23 DIAGNOSIS — Z992 Dependence on renal dialysis: Secondary | ICD-10-CM | POA: Diagnosis not present

## 2016-01-23 DIAGNOSIS — D631 Anemia in chronic kidney disease: Secondary | ICD-10-CM | POA: Diagnosis not present

## 2016-01-23 DIAGNOSIS — N2581 Secondary hyperparathyroidism of renal origin: Secondary | ICD-10-CM | POA: Diagnosis not present

## 2016-01-23 DIAGNOSIS — D509 Iron deficiency anemia, unspecified: Secondary | ICD-10-CM | POA: Diagnosis not present

## 2016-01-23 DIAGNOSIS — N186 End stage renal disease: Secondary | ICD-10-CM | POA: Diagnosis not present

## 2016-01-25 DIAGNOSIS — N186 End stage renal disease: Secondary | ICD-10-CM | POA: Diagnosis not present

## 2016-01-25 DIAGNOSIS — Z992 Dependence on renal dialysis: Secondary | ICD-10-CM | POA: Diagnosis not present

## 2016-01-25 DIAGNOSIS — N2581 Secondary hyperparathyroidism of renal origin: Secondary | ICD-10-CM | POA: Diagnosis not present

## 2016-01-25 DIAGNOSIS — Z23 Encounter for immunization: Secondary | ICD-10-CM | POA: Diagnosis not present

## 2016-01-25 DIAGNOSIS — D631 Anemia in chronic kidney disease: Secondary | ICD-10-CM | POA: Diagnosis not present

## 2016-01-25 DIAGNOSIS — D509 Iron deficiency anemia, unspecified: Secondary | ICD-10-CM | POA: Diagnosis not present

## 2016-01-27 ENCOUNTER — Encounter (INDEPENDENT_AMBULATORY_CARE_PROVIDER_SITE_OTHER): Payer: Self-pay | Admitting: Vascular Surgery

## 2016-01-27 ENCOUNTER — Ambulatory Visit (INDEPENDENT_AMBULATORY_CARE_PROVIDER_SITE_OTHER): Payer: Medicare Other | Admitting: Vascular Surgery

## 2016-01-27 VITALS — BP 148/65 | HR 82 | Resp 16 | Ht 64.5 in | Wt 112.0 lb

## 2016-01-27 DIAGNOSIS — Z992 Dependence on renal dialysis: Secondary | ICD-10-CM

## 2016-01-27 DIAGNOSIS — N186 End stage renal disease: Secondary | ICD-10-CM

## 2016-01-27 DIAGNOSIS — E785 Hyperlipidemia, unspecified: Secondary | ICD-10-CM

## 2016-01-27 DIAGNOSIS — F17200 Nicotine dependence, unspecified, uncomplicated: Secondary | ICD-10-CM
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
## 2016-01-28 DIAGNOSIS — D631 Anemia in chronic kidney disease: Secondary | ICD-10-CM | POA: Diagnosis not present

## 2016-01-28 DIAGNOSIS — N2581 Secondary hyperparathyroidism of renal origin: Secondary | ICD-10-CM | POA: Diagnosis not present

## 2016-01-28 DIAGNOSIS — Z23 Encounter for immunization: Secondary | ICD-10-CM | POA: Diagnosis not present

## 2016-01-28 DIAGNOSIS — D509 Iron deficiency anemia, unspecified: Secondary | ICD-10-CM | POA: Diagnosis not present

## 2016-01-28 DIAGNOSIS — N186 End stage renal disease: Secondary | ICD-10-CM | POA: Diagnosis not present

## 2016-01-28 DIAGNOSIS — Z992 Dependence on renal dialysis: Secondary | ICD-10-CM | POA: Diagnosis not present

## 2016-01-30 DIAGNOSIS — D509 Iron deficiency anemia, unspecified: Secondary | ICD-10-CM | POA: Diagnosis not present

## 2016-01-30 DIAGNOSIS — N186 End stage renal disease: Secondary | ICD-10-CM | POA: Diagnosis not present

## 2016-01-30 DIAGNOSIS — D631 Anemia in chronic kidney disease: Secondary | ICD-10-CM | POA: Diagnosis not present

## 2016-01-30 DIAGNOSIS — Z992 Dependence on renal dialysis: Secondary | ICD-10-CM | POA: Diagnosis not present

## 2016-02-01 DIAGNOSIS — N186 End stage renal disease: Secondary | ICD-10-CM | POA: Diagnosis not present

## 2016-02-01 DIAGNOSIS — Z992 Dependence on renal dialysis: Secondary | ICD-10-CM | POA: Diagnosis not present

## 2016-02-01 DIAGNOSIS — D509 Iron deficiency anemia, unspecified: Secondary | ICD-10-CM | POA: Diagnosis not present

## 2016-02-01 DIAGNOSIS — D631 Anemia in chronic kidney disease: Secondary | ICD-10-CM | POA: Diagnosis not present

## 2016-02-03 DIAGNOSIS — J9611 Chronic respiratory failure with hypoxia: Secondary | ICD-10-CM | POA: Diagnosis not present

## 2016-02-03 DIAGNOSIS — J449 Chronic obstructive pulmonary disease, unspecified: Secondary | ICD-10-CM | POA: Diagnosis not present

## 2016-02-03 DIAGNOSIS — F17211 Nicotine dependence, cigarettes, in remission: Secondary | ICD-10-CM | POA: Diagnosis not present

## 2016-02-04 DIAGNOSIS — D631 Anemia in chronic kidney disease: Secondary | ICD-10-CM | POA: Diagnosis not present

## 2016-02-04 DIAGNOSIS — D509 Iron deficiency anemia, unspecified: Secondary | ICD-10-CM | POA: Diagnosis not present

## 2016-02-04 DIAGNOSIS — Z992 Dependence on renal dialysis: Secondary | ICD-10-CM | POA: Diagnosis not present

## 2016-02-04 DIAGNOSIS — N186 End stage renal disease: Secondary | ICD-10-CM | POA: Diagnosis not present

## 2016-02-05 ENCOUNTER — Other Ambulatory Visit: Payer: Self-pay | Admitting: Family Medicine

## 2016-02-06 DIAGNOSIS — Z992 Dependence on renal dialysis: Secondary | ICD-10-CM | POA: Diagnosis not present

## 2016-02-06 DIAGNOSIS — D509 Iron deficiency anemia, unspecified: Secondary | ICD-10-CM | POA: Diagnosis not present

## 2016-02-06 DIAGNOSIS — N186 End stage renal disease: Secondary | ICD-10-CM | POA: Diagnosis not present

## 2016-02-06 DIAGNOSIS — D631 Anemia in chronic kidney disease: Secondary | ICD-10-CM | POA: Diagnosis not present

## 2016-02-08 DIAGNOSIS — D631 Anemia in chronic kidney disease: Secondary | ICD-10-CM | POA: Diagnosis not present

## 2016-02-08 DIAGNOSIS — N186 End stage renal disease: Secondary | ICD-10-CM | POA: Diagnosis not present

## 2016-02-08 DIAGNOSIS — D509 Iron deficiency anemia, unspecified: Secondary | ICD-10-CM | POA: Diagnosis not present

## 2016-02-08 DIAGNOSIS — Z992 Dependence on renal dialysis: Secondary | ICD-10-CM | POA: Diagnosis not present

## 2016-02-11 DIAGNOSIS — Z992 Dependence on renal dialysis: Secondary | ICD-10-CM | POA: Diagnosis not present

## 2016-02-11 DIAGNOSIS — N186 End stage renal disease: Secondary | ICD-10-CM | POA: Diagnosis not present

## 2016-02-11 DIAGNOSIS — D509 Iron deficiency anemia, unspecified: Secondary | ICD-10-CM | POA: Diagnosis not present

## 2016-02-11 DIAGNOSIS — D631 Anemia in chronic kidney disease: Secondary | ICD-10-CM | POA: Diagnosis not present

## 2016-02-12 DIAGNOSIS — E063 Autoimmune thyroiditis: Secondary | ICD-10-CM | POA: Diagnosis not present

## 2016-02-12 DIAGNOSIS — E038 Other specified hypothyroidism: Secondary | ICD-10-CM | POA: Diagnosis not present

## 2016-02-13 DIAGNOSIS — D631 Anemia in chronic kidney disease: Secondary | ICD-10-CM | POA: Diagnosis not present

## 2016-02-13 DIAGNOSIS — Z992 Dependence on renal dialysis: Secondary | ICD-10-CM | POA: Diagnosis not present

## 2016-02-13 DIAGNOSIS — D509 Iron deficiency anemia, unspecified: Secondary | ICD-10-CM | POA: Diagnosis not present

## 2016-02-13 DIAGNOSIS — N186 End stage renal disease: Secondary | ICD-10-CM | POA: Diagnosis not present

## 2016-02-15 DIAGNOSIS — D509 Iron deficiency anemia, unspecified: Secondary | ICD-10-CM | POA: Diagnosis not present

## 2016-02-15 DIAGNOSIS — Z992 Dependence on renal dialysis: Secondary | ICD-10-CM | POA: Diagnosis not present

## 2016-02-15 DIAGNOSIS — D631 Anemia in chronic kidney disease: Secondary | ICD-10-CM | POA: Diagnosis not present

## 2016-02-15 DIAGNOSIS — N186 End stage renal disease: Secondary | ICD-10-CM | POA: Diagnosis not present

## 2016-02-17 ENCOUNTER — Ambulatory Visit (INDEPENDENT_AMBULATORY_CARE_PROVIDER_SITE_OTHER): Payer: Medicare Other | Admitting: Vascular Surgery

## 2016-02-17 ENCOUNTER — Encounter (INDEPENDENT_AMBULATORY_CARE_PROVIDER_SITE_OTHER): Payer: Self-pay | Admitting: Vascular Surgery

## 2016-02-17 ENCOUNTER — Ambulatory Visit (INDEPENDENT_AMBULATORY_CARE_PROVIDER_SITE_OTHER): Payer: Medicare Other

## 2016-02-17 VITALS — BP 123/61 | HR 84 | Resp 16 | Wt 112.0 lb

## 2016-02-17 DIAGNOSIS — N186 End stage renal disease: Secondary | ICD-10-CM | POA: Diagnosis not present

## 2016-02-17 DIAGNOSIS — Z992 Dependence on renal dialysis: Secondary | ICD-10-CM

## 2016-02-17 DIAGNOSIS — D7582 Heparin induced thrombocytopenia (HIT): Secondary | ICD-10-CM

## 2016-02-17 DIAGNOSIS — D509 Iron deficiency anemia, unspecified: Secondary | ICD-10-CM | POA: Diagnosis not present

## 2016-02-17 DIAGNOSIS — D631 Anemia in chronic kidney disease: Secondary | ICD-10-CM | POA: Diagnosis not present

## 2016-02-17 DIAGNOSIS — I713 Abdominal aortic aneurysm, ruptured, unspecified: Secondary | ICD-10-CM

## 2016-02-17 DIAGNOSIS — I10 Essential (primary) hypertension: Secondary | ICD-10-CM

## 2016-02-17 DIAGNOSIS — J438 Other emphysema: Secondary | ICD-10-CM

## 2016-02-17 DIAGNOSIS — D75829 Heparin-induced thrombocytopenia, unspecified: Secondary | ICD-10-CM
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
## 2016-02-19 ENCOUNTER — Encounter (INDEPENDENT_AMBULATORY_CARE_PROVIDER_SITE_OTHER): Payer: Self-pay

## 2016-02-19 ENCOUNTER — Other Ambulatory Visit (INDEPENDENT_AMBULATORY_CARE_PROVIDER_SITE_OTHER): Payer: Self-pay | Admitting: Vascular Surgery

## 2016-02-19 DIAGNOSIS — N186 End stage renal disease: Secondary | ICD-10-CM | POA: Diagnosis not present

## 2016-02-19 DIAGNOSIS — D631 Anemia in chronic kidney disease: Secondary | ICD-10-CM | POA: Diagnosis not present

## 2016-02-19 DIAGNOSIS — Z992 Dependence on renal dialysis: Secondary | ICD-10-CM | POA: Diagnosis not present

## 2016-02-19 DIAGNOSIS — D509 Iron deficiency anemia, unspecified: Secondary | ICD-10-CM | POA: Diagnosis not present

## 2016-02-22 DIAGNOSIS — N186 End stage renal disease: Secondary | ICD-10-CM | POA: Diagnosis not present

## 2016-02-22 DIAGNOSIS — D631 Anemia in chronic kidney disease: Secondary | ICD-10-CM | POA: Diagnosis not present

## 2016-02-22 DIAGNOSIS — D509 Iron deficiency anemia, unspecified: Secondary | ICD-10-CM | POA: Diagnosis not present

## 2016-02-22 DIAGNOSIS — Z992 Dependence on renal dialysis: Secondary | ICD-10-CM | POA: Diagnosis not present

## 2016-02-25 ENCOUNTER — Encounter
Admission: RE | Disposition: A | Discharge status: Home / Self Care | Payer: Self-pay | Source: Ambulatory Visit | Attending: Vascular Surgery

## 2016-02-25 ENCOUNTER — Ambulatory Visit
Admission: RE | Admit: 2016-02-25 | Discharge: 2016-02-25 | Disposition: A | Discharge status: Home / Self Care | Payer: Medicare Other | Source: Ambulatory Visit | Attending: Vascular Surgery | Admitting: Vascular Surgery

## 2016-02-25 DIAGNOSIS — N186 End stage renal disease: Secondary | ICD-10-CM | POA: Diagnosis not present

## 2016-02-25 DIAGNOSIS — Z9981 Dependence on supplemental oxygen: Secondary | ICD-10-CM | POA: Diagnosis not present

## 2016-02-25 DIAGNOSIS — Z951 Presence of aortocoronary bypass graft: Secondary | ICD-10-CM | POA: Insufficient documentation

## 2016-02-25 DIAGNOSIS — J449 Chronic obstructive pulmonary disease, unspecified: Secondary | ICD-10-CM | POA: Diagnosis not present

## 2016-02-25 DIAGNOSIS — I5030 Unspecified diastolic (congestive) heart failure: Secondary | ICD-10-CM | POA: Insufficient documentation

## 2016-02-25 DIAGNOSIS — K219 Gastro-esophageal reflux disease without esophagitis: Secondary | ICD-10-CM | POA: Insufficient documentation

## 2016-02-25 DIAGNOSIS — D649 Anemia, unspecified: Secondary | ICD-10-CM | POA: Insufficient documentation

## 2016-02-25 DIAGNOSIS — E079 Disorder of thyroid, unspecified: Secondary | ICD-10-CM | POA: Diagnosis not present

## 2016-02-25 DIAGNOSIS — Z886 Allergy status to analgesic agent status: Secondary | ICD-10-CM | POA: Diagnosis not present

## 2016-02-25 DIAGNOSIS — Z888 Allergy status to other drugs, medicaments and biological substances status: Secondary | ICD-10-CM | POA: Insufficient documentation

## 2016-02-25 DIAGNOSIS — D509 Iron deficiency anemia, unspecified: Secondary | ICD-10-CM | POA: Diagnosis not present

## 2016-02-25 DIAGNOSIS — D7582 Heparin induced thrombocytopenia (HIT): Secondary | ICD-10-CM | POA: Insufficient documentation

## 2016-02-25 DIAGNOSIS — Z8249 Family history of ischemic heart disease and other diseases of the circulatory system: Secondary | ICD-10-CM | POA: Insufficient documentation

## 2016-02-25 DIAGNOSIS — Z808 Family history of malignant neoplasm of other organs or systems: Secondary | ICD-10-CM | POA: Insufficient documentation

## 2016-02-25 DIAGNOSIS — E785 Hyperlipidemia, unspecified: Secondary | ICD-10-CM | POA: Insufficient documentation

## 2016-02-25 DIAGNOSIS — I714 Abdominal aortic aneurysm, without rupture: Secondary | ICD-10-CM | POA: Diagnosis not present

## 2016-02-25 DIAGNOSIS — D631 Anemia in chronic kidney disease: Secondary | ICD-10-CM | POA: Diagnosis not present

## 2016-02-25 DIAGNOSIS — Z87891 Personal history of nicotine dependence: Secondary | ICD-10-CM | POA: Diagnosis not present

## 2016-02-25 DIAGNOSIS — Z823 Family history of stroke: Secondary | ICD-10-CM | POA: Insufficient documentation

## 2016-02-25 DIAGNOSIS — Z452 Encounter for adjustment and management of vascular access device: Secondary | ICD-10-CM | POA: Insufficient documentation

## 2016-02-25 DIAGNOSIS — L920 Granuloma annulare: Secondary | ICD-10-CM | POA: Insufficient documentation

## 2016-02-25 DIAGNOSIS — Z992 Dependence on renal dialysis: Secondary | ICD-10-CM | POA: Insufficient documentation

## 2016-02-25 DIAGNOSIS — J438 Other emphysema: Secondary | ICD-10-CM | POA: Diagnosis not present

## 2016-02-25 DIAGNOSIS — I132 Hypertensive heart and chronic kidney disease with heart failure and with stage 5 chronic kidney disease, or end stage renal disease: Secondary | ICD-10-CM | POA: Insufficient documentation

## 2016-02-25 DIAGNOSIS — Z8619 Personal history of other infectious and parasitic diseases: Secondary | ICD-10-CM | POA: Diagnosis not present

## 2016-02-25 HISTORY — PX: PERIPHERAL VASCULAR CATHETERIZATION: SHX172C

## 2016-02-25 SURGERY — DIALYSIS/PERMA CATHETER REMOVAL
Anesthesia: Moderate Sedation

## 2016-02-25 SURGICAL SUPPLY — 2 items
TRAY LACERAT/PLASTIC (MISCELLANEOUS) ×2 IMPLANT
TRAY SUT REMOVAL LITTAUER SCS (KITS) ×3 IMPLANT

## 2016-02-25 NOTE — Discharge Instructions (Signed)
Dressing can be removed tomorrow, right upper chest, if you feel anything is life threatening go to the emergency room. If you start bleeding under dressing, apply pressure for 10 minutes.

## 2016-02-25 NOTE — Op Note (Signed)
  OPERATIVE NOTE   PROCEDURE: 1. Removal of a right IJ tunneled dialysis catheter  PRE-OPERATIVE DIAGNOSIS: Complication of dialysis catheter  POST-OPERATIVE DIAGNOSIS: Same  SURGEON: Hortencia Pilar  ANESTHESIA: Local anesthetic with 1% lidocaine with epinephrine   ESTIMATED BLOOD LOSS: Minimal   FINDING(S): 1. Catheter intact   SPECIMEN(S):  Catheter  INDICATIONS:   [name] is a 65 y.o. female who presents with functioning arm access and a nonfunctioning tunneled catheter. Therefore the tunnel catheters being removed.  DESCRIPTION: After obtaining full informed written consent, the patient was positioned supine. The right IJ catheter and surrounding area is prepped and draped in a sterile fashion. The cuff was localized by palpation and noted to be greater than 3 cm from the exit site. After appropriate timeout is called, 1% lidocaine with epinephrine is infiltrated into the surrounding tissues around the cuff. Small transverse incision is created with an 11 blade scalpel and the dissection was carried down to expose the cuff of the tunneled catheter.  The catheter is then freed from the surrounding attachments and adhesions. Once the catheter has been freed circumferentially it is transected just distal to the cuff and subsequently removed in 2 pieces. Light pressure was held at the base of the neck. A 4-0 Monocryl was used close the tunnel in the subcutaneous space. The 4-0 Monocryl Monocryl was then used to close the skin in a subcuticular stitch. Dermabond is applied.  Antibiotic ointment and a sterile dressing is applied to the exit site. Patient tolerated procedure well and there were no complications.  COMPLICATIONS: None  CONDITION: Unchanged  Hortencia Pilar. [facility] Office: [phone]  02/25/2016,2:49 PM

## 2016-02-25 NOTE — H&P (Signed)
[facility] History & Physical Update  The patient was interviewed and re-examined.  The patient's previous History and Physical has been reviewed and is unchanged.  There is no change in the plan of care. We plan to proceed with the scheduled procedure.  Hortencia Pilar, MD  02/25/2016, 2:49 PM

## 2016-02-27 ENCOUNTER — Encounter: Payer: Self-pay | Admitting: Vascular Surgery

## 2016-02-27 DIAGNOSIS — D631 Anemia in chronic kidney disease: Secondary | ICD-10-CM | POA: Diagnosis not present

## 2016-02-27 DIAGNOSIS — N186 End stage renal disease: Secondary | ICD-10-CM | POA: Diagnosis not present

## 2016-02-27 DIAGNOSIS — Z992 Dependence on renal dialysis: Secondary | ICD-10-CM | POA: Diagnosis not present

## 2016-02-27 DIAGNOSIS — D509 Iron deficiency anemia, unspecified: Secondary | ICD-10-CM | POA: Diagnosis not present

## 2016-02-29 DIAGNOSIS — D631 Anemia in chronic kidney disease: Secondary | ICD-10-CM | POA: Diagnosis not present

## 2016-02-29 DIAGNOSIS — Z992 Dependence on renal dialysis: Secondary | ICD-10-CM | POA: Diagnosis not present

## 2016-02-29 DIAGNOSIS — Z23 Encounter for immunization: Secondary | ICD-10-CM | POA: Diagnosis not present

## 2016-02-29 DIAGNOSIS — D509 Iron deficiency anemia, unspecified: Secondary | ICD-10-CM | POA: Diagnosis not present

## 2016-02-29 DIAGNOSIS — N186 End stage renal disease: Secondary | ICD-10-CM | POA: Diagnosis not present

## 2016-03-02 DIAGNOSIS — E063 Autoimmune thyroiditis: Secondary | ICD-10-CM | POA: Diagnosis not present

## 2016-03-02 DIAGNOSIS — E038 Other specified hypothyroidism: Secondary | ICD-10-CM | POA: Diagnosis not present

## 2016-03-03 DIAGNOSIS — D509 Iron deficiency anemia, unspecified: Secondary | ICD-10-CM | POA: Diagnosis not present

## 2016-03-03 DIAGNOSIS — N186 End stage renal disease: Secondary | ICD-10-CM | POA: Diagnosis not present

## 2016-03-03 DIAGNOSIS — Z23 Encounter for immunization: Secondary | ICD-10-CM | POA: Diagnosis not present

## 2016-03-03 DIAGNOSIS — D631 Anemia in chronic kidney disease: Secondary | ICD-10-CM | POA: Diagnosis not present

## 2016-03-03 DIAGNOSIS — Z992 Dependence on renal dialysis: Secondary | ICD-10-CM | POA: Diagnosis not present

## 2016-03-05 DIAGNOSIS — D631 Anemia in chronic kidney disease: Secondary | ICD-10-CM | POA: Diagnosis not present

## 2016-03-05 DIAGNOSIS — D509 Iron deficiency anemia, unspecified: Secondary | ICD-10-CM | POA: Diagnosis not present

## 2016-03-05 DIAGNOSIS — Z23 Encounter for immunization: Secondary | ICD-10-CM | POA: Diagnosis not present

## 2016-03-05 DIAGNOSIS — N186 End stage renal disease: Secondary | ICD-10-CM | POA: Diagnosis not present

## 2016-03-05 DIAGNOSIS — Z992 Dependence on renal dialysis: Secondary | ICD-10-CM | POA: Diagnosis not present

## 2016-03-07 DIAGNOSIS — D509 Iron deficiency anemia, unspecified: Secondary | ICD-10-CM | POA: Diagnosis not present

## 2016-03-07 DIAGNOSIS — Z992 Dependence on renal dialysis: Secondary | ICD-10-CM | POA: Diagnosis not present

## 2016-03-07 DIAGNOSIS — Z23 Encounter for immunization: Secondary | ICD-10-CM | POA: Diagnosis not present

## 2016-03-07 DIAGNOSIS — N186 End stage renal disease: Secondary | ICD-10-CM | POA: Diagnosis not present

## 2016-03-07 DIAGNOSIS — D631 Anemia in chronic kidney disease: Secondary | ICD-10-CM | POA: Diagnosis not present

## 2016-03-10 DIAGNOSIS — Z23 Encounter for immunization: Secondary | ICD-10-CM | POA: Diagnosis not present

## 2016-03-10 DIAGNOSIS — D631 Anemia in chronic kidney disease: Secondary | ICD-10-CM | POA: Diagnosis not present

## 2016-03-10 DIAGNOSIS — N186 End stage renal disease: Secondary | ICD-10-CM | POA: Diagnosis not present

## 2016-03-10 DIAGNOSIS — Z992 Dependence on renal dialysis: Secondary | ICD-10-CM | POA: Diagnosis not present

## 2016-03-10 DIAGNOSIS — D509 Iron deficiency anemia, unspecified: Secondary | ICD-10-CM | POA: Diagnosis not present

## 2016-03-12 DIAGNOSIS — D509 Iron deficiency anemia, unspecified: Secondary | ICD-10-CM | POA: Diagnosis not present

## 2016-03-12 DIAGNOSIS — Z992 Dependence on renal dialysis: Secondary | ICD-10-CM | POA: Diagnosis not present

## 2016-03-12 DIAGNOSIS — N186 End stage renal disease: Secondary | ICD-10-CM | POA: Diagnosis not present

## 2016-03-12 DIAGNOSIS — D631 Anemia in chronic kidney disease: Secondary | ICD-10-CM | POA: Diagnosis not present

## 2016-03-12 DIAGNOSIS — Z23 Encounter for immunization: Secondary | ICD-10-CM | POA: Diagnosis not present

## 2016-03-14 DIAGNOSIS — D631 Anemia in chronic kidney disease: Secondary | ICD-10-CM | POA: Diagnosis not present

## 2016-03-14 DIAGNOSIS — D509 Iron deficiency anemia, unspecified: Secondary | ICD-10-CM | POA: Diagnosis not present

## 2016-03-14 DIAGNOSIS — Z23 Encounter for immunization: Secondary | ICD-10-CM | POA: Diagnosis not present

## 2016-03-14 DIAGNOSIS — N186 End stage renal disease: Secondary | ICD-10-CM | POA: Diagnosis not present

## 2016-03-14 DIAGNOSIS — Z992 Dependence on renal dialysis: Secondary | ICD-10-CM | POA: Diagnosis not present

## 2016-03-17 DIAGNOSIS — D631 Anemia in chronic kidney disease: Secondary | ICD-10-CM | POA: Diagnosis not present

## 2016-03-17 DIAGNOSIS — Z992 Dependence on renal dialysis: Secondary | ICD-10-CM | POA: Diagnosis not present

## 2016-03-17 DIAGNOSIS — N186 End stage renal disease: Secondary | ICD-10-CM | POA: Diagnosis not present

## 2016-03-17 DIAGNOSIS — Z23 Encounter for immunization: Secondary | ICD-10-CM | POA: Diagnosis not present

## 2016-03-17 DIAGNOSIS — D509 Iron deficiency anemia, unspecified: Secondary | ICD-10-CM | POA: Diagnosis not present

## 2016-03-19 DIAGNOSIS — Z23 Encounter for immunization: Secondary | ICD-10-CM | POA: Diagnosis not present

## 2016-03-19 DIAGNOSIS — D631 Anemia in chronic kidney disease: Secondary | ICD-10-CM | POA: Diagnosis not present

## 2016-03-19 DIAGNOSIS — Z992 Dependence on renal dialysis: Secondary | ICD-10-CM | POA: Diagnosis not present

## 2016-03-19 DIAGNOSIS — D509 Iron deficiency anemia, unspecified: Secondary | ICD-10-CM | POA: Diagnosis not present

## 2016-03-19 DIAGNOSIS — N186 End stage renal disease: Secondary | ICD-10-CM | POA: Diagnosis not present

## 2016-03-21 DIAGNOSIS — D509 Iron deficiency anemia, unspecified: Secondary | ICD-10-CM | POA: Diagnosis not present

## 2016-03-21 DIAGNOSIS — D631 Anemia in chronic kidney disease: Secondary | ICD-10-CM | POA: Diagnosis not present

## 2016-03-21 DIAGNOSIS — Z992 Dependence on renal dialysis: Secondary | ICD-10-CM | POA: Diagnosis not present

## 2016-03-21 DIAGNOSIS — Z23 Encounter for immunization: Secondary | ICD-10-CM | POA: Diagnosis not present

## 2016-03-21 DIAGNOSIS — N186 End stage renal disease: Secondary | ICD-10-CM | POA: Diagnosis not present

## 2016-03-24 DIAGNOSIS — D509 Iron deficiency anemia, unspecified: Secondary | ICD-10-CM | POA: Diagnosis not present

## 2016-03-24 DIAGNOSIS — Z992 Dependence on renal dialysis: Secondary | ICD-10-CM | POA: Diagnosis not present

## 2016-03-24 DIAGNOSIS — Z23 Encounter for immunization: Secondary | ICD-10-CM | POA: Diagnosis not present

## 2016-03-24 DIAGNOSIS — N186 End stage renal disease: Secondary | ICD-10-CM | POA: Diagnosis not present

## 2016-03-24 DIAGNOSIS — D631 Anemia in chronic kidney disease: Secondary | ICD-10-CM | POA: Diagnosis not present

## 2016-03-25 DIAGNOSIS — I771 Stricture of artery: Secondary | ICD-10-CM | POA: Diagnosis not present

## 2016-03-25 DIAGNOSIS — I871 Compression of vein: Secondary | ICD-10-CM | POA: Diagnosis not present

## 2016-03-25 DIAGNOSIS — T82868A Thrombosis of vascular prosthetic devices, implants and grafts, initial encounter: Secondary | ICD-10-CM | POA: Diagnosis not present

## 2016-03-26 DIAGNOSIS — Z992 Dependence on renal dialysis: Secondary | ICD-10-CM | POA: Diagnosis not present

## 2016-03-26 DIAGNOSIS — D631 Anemia in chronic kidney disease: Secondary | ICD-10-CM | POA: Diagnosis not present

## 2016-03-26 DIAGNOSIS — D509 Iron deficiency anemia, unspecified: Secondary | ICD-10-CM | POA: Diagnosis not present

## 2016-03-26 DIAGNOSIS — Z23 Encounter for immunization: Secondary | ICD-10-CM | POA: Diagnosis not present

## 2016-03-26 DIAGNOSIS — N186 End stage renal disease: Secondary | ICD-10-CM | POA: Diagnosis not present

## 2016-03-28 DIAGNOSIS — Z992 Dependence on renal dialysis: Secondary | ICD-10-CM | POA: Diagnosis not present

## 2016-03-28 DIAGNOSIS — D509 Iron deficiency anemia, unspecified: Secondary | ICD-10-CM | POA: Diagnosis not present

## 2016-03-28 DIAGNOSIS — Z23 Encounter for immunization: Secondary | ICD-10-CM | POA: Diagnosis not present

## 2016-03-28 DIAGNOSIS — N186 End stage renal disease: Secondary | ICD-10-CM | POA: Diagnosis not present

## 2016-03-28 DIAGNOSIS — D631 Anemia in chronic kidney disease: Secondary | ICD-10-CM | POA: Diagnosis not present

## 2016-03-29 DIAGNOSIS — Z992 Dependence on renal dialysis: Secondary | ICD-10-CM | POA: Diagnosis not present

## 2016-03-29 DIAGNOSIS — N186 End stage renal disease: Secondary | ICD-10-CM | POA: Diagnosis not present

## 2016-03-31 DIAGNOSIS — D631 Anemia in chronic kidney disease: Secondary | ICD-10-CM | POA: Diagnosis not present

## 2016-03-31 DIAGNOSIS — N186 End stage renal disease: Secondary | ICD-10-CM | POA: Diagnosis not present

## 2016-03-31 DIAGNOSIS — D509 Iron deficiency anemia, unspecified: Secondary | ICD-10-CM | POA: Diagnosis not present

## 2016-03-31 DIAGNOSIS — Z992 Dependence on renal dialysis: Secondary | ICD-10-CM | POA: Diagnosis not present

## 2016-03-31 DIAGNOSIS — Z23 Encounter for immunization: Secondary | ICD-10-CM | POA: Diagnosis not present

## 2016-04-02 DIAGNOSIS — Z992 Dependence on renal dialysis: Secondary | ICD-10-CM | POA: Diagnosis not present

## 2016-04-02 DIAGNOSIS — N186 End stage renal disease: Secondary | ICD-10-CM | POA: Diagnosis not present

## 2016-04-02 DIAGNOSIS — D509 Iron deficiency anemia, unspecified: Secondary | ICD-10-CM | POA: Diagnosis not present

## 2016-04-02 DIAGNOSIS — Z23 Encounter for immunization: Secondary | ICD-10-CM | POA: Diagnosis not present

## 2016-04-02 DIAGNOSIS — D631 Anemia in chronic kidney disease: Secondary | ICD-10-CM | POA: Diagnosis not present

## 2016-04-04 DIAGNOSIS — D631 Anemia in chronic kidney disease: Secondary | ICD-10-CM | POA: Diagnosis not present

## 2016-04-04 DIAGNOSIS — Z23 Encounter for immunization: Secondary | ICD-10-CM | POA: Diagnosis not present

## 2016-04-04 DIAGNOSIS — D509 Iron deficiency anemia, unspecified: Secondary | ICD-10-CM | POA: Diagnosis not present

## 2016-04-04 DIAGNOSIS — Z992 Dependence on renal dialysis: Secondary | ICD-10-CM | POA: Diagnosis not present

## 2016-04-04 DIAGNOSIS — N186 End stage renal disease: Secondary | ICD-10-CM | POA: Diagnosis not present

## 2016-04-07 DIAGNOSIS — D631 Anemia in chronic kidney disease: Secondary | ICD-10-CM | POA: Diagnosis not present

## 2016-04-07 DIAGNOSIS — Z992 Dependence on renal dialysis: Secondary | ICD-10-CM | POA: Diagnosis not present

## 2016-04-07 DIAGNOSIS — D509 Iron deficiency anemia, unspecified: Secondary | ICD-10-CM | POA: Diagnosis not present

## 2016-04-07 DIAGNOSIS — Z23 Encounter for immunization: Secondary | ICD-10-CM | POA: Diagnosis not present

## 2016-04-07 DIAGNOSIS — N186 End stage renal disease: Secondary | ICD-10-CM | POA: Diagnosis not present

## 2016-04-09 DIAGNOSIS — Z23 Encounter for immunization: Secondary | ICD-10-CM | POA: Diagnosis not present

## 2016-04-09 DIAGNOSIS — D509 Iron deficiency anemia, unspecified: Secondary | ICD-10-CM | POA: Diagnosis not present

## 2016-04-09 DIAGNOSIS — Z992 Dependence on renal dialysis: Secondary | ICD-10-CM | POA: Diagnosis not present

## 2016-04-09 DIAGNOSIS — D631 Anemia in chronic kidney disease: Secondary | ICD-10-CM | POA: Diagnosis not present

## 2016-04-09 DIAGNOSIS — N186 End stage renal disease: Secondary | ICD-10-CM | POA: Diagnosis not present

## 2016-04-10 DIAGNOSIS — Z992 Dependence on renal dialysis: Secondary | ICD-10-CM | POA: Diagnosis not present

## 2016-04-10 DIAGNOSIS — D509 Iron deficiency anemia, unspecified: Secondary | ICD-10-CM | POA: Diagnosis not present

## 2016-04-10 DIAGNOSIS — N186 End stage renal disease: Secondary | ICD-10-CM | POA: Diagnosis not present

## 2016-04-10 DIAGNOSIS — E8770 Fluid overload, unspecified: Secondary | ICD-10-CM | POA: Diagnosis not present

## 2016-04-10 DIAGNOSIS — D631 Anemia in chronic kidney disease: Secondary | ICD-10-CM | POA: Diagnosis not present

## 2016-04-11 ENCOUNTER — Emergency Department
Admission: EM | Admit: 2016-04-11 | Discharge: 2016-04-11 | Disposition: A | Discharge status: Home / Self Care | Payer: Medicare Other | Attending: Emergency Medicine | Admitting: Emergency Medicine

## 2016-04-11 DIAGNOSIS — I509 Heart failure, unspecified: Secondary | ICD-10-CM | POA: Diagnosis not present

## 2016-04-11 DIAGNOSIS — I132 Hypertensive heart and chronic kidney disease with heart failure and with stage 5 chronic kidney disease, or end stage renal disease: Secondary | ICD-10-CM | POA: Diagnosis not present

## 2016-04-11 DIAGNOSIS — J449 Chronic obstructive pulmonary disease, unspecified: Secondary | ICD-10-CM | POA: Diagnosis not present

## 2016-04-11 DIAGNOSIS — Z79899 Other long term (current) drug therapy: Secondary | ICD-10-CM | POA: Diagnosis not present

## 2016-04-11 DIAGNOSIS — D631 Anemia in chronic kidney disease: Secondary | ICD-10-CM | POA: Diagnosis not present

## 2016-04-11 DIAGNOSIS — D509 Iron deficiency anemia, unspecified: Secondary | ICD-10-CM | POA: Diagnosis not present

## 2016-04-11 DIAGNOSIS — Z992 Dependence on renal dialysis: Secondary | ICD-10-CM | POA: Insufficient documentation

## 2016-04-11 DIAGNOSIS — I729 Aneurysm of unspecified site: Secondary | ICD-10-CM | POA: Diagnosis not present

## 2016-04-11 DIAGNOSIS — I721 Aneurysm of artery of upper extremity: Secondary | ICD-10-CM | POA: Diagnosis not present

## 2016-04-11 DIAGNOSIS — N186 End stage renal disease: Secondary | ICD-10-CM | POA: Insufficient documentation

## 2016-04-11 DIAGNOSIS — Z85828 Personal history of other malignant neoplasm of skin: Secondary | ICD-10-CM | POA: Insufficient documentation

## 2016-04-11 DIAGNOSIS — Z87891 Personal history of nicotine dependence: Secondary | ICD-10-CM | POA: Insufficient documentation

## 2016-04-11 DIAGNOSIS — E039 Hypothyroidism, unspecified: Secondary | ICD-10-CM | POA: Diagnosis not present

## 2016-04-11 DIAGNOSIS — Z23 Encounter for immunization: Secondary | ICD-10-CM | POA: Diagnosis not present

## 2016-04-11 NOTE — ED Notes (Signed)
Dr. Clearnce Hasten updated regarding ultrasound stating that are unable to perform vascular access ultrasound at this time. No further orders received.
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
## 2016-04-11 NOTE — ED Notes (Signed)
Spoke with Dr. Dineen Kid regarding pt and sx. Orders for ultrasound of vascular access discussed. Spoke with Jerline Pain in ultrasound who states that these studies are not done on the weekends.

## 2016-04-11 NOTE — ED Notes (Signed)
Pt NAD at this time. MD Schaevitz to page the on call MD to determine the pt's plan of care. Pt denies pain and states she feels good after her dialysis treatment today.

## 2016-04-11 NOTE — ED Triage Notes (Signed)
Pt arrives to ER via POV c/o left arm graft having "anuerysm" to it that got worse during dialysis today. Pt comes from dialysis where she had full treatment. Swelling to left arm at site of dialysis. Skin intact at this time.

## 2016-04-14 DIAGNOSIS — Z992 Dependence on renal dialysis: Secondary | ICD-10-CM | POA: Diagnosis not present

## 2016-04-14 DIAGNOSIS — D631 Anemia in chronic kidney disease: Secondary | ICD-10-CM | POA: Diagnosis not present

## 2016-04-14 DIAGNOSIS — D509 Iron deficiency anemia, unspecified: Secondary | ICD-10-CM | POA: Diagnosis not present

## 2016-04-14 DIAGNOSIS — Z23 Encounter for immunization: Secondary | ICD-10-CM | POA: Diagnosis not present

## 2016-04-14 DIAGNOSIS — N186 End stage renal disease: Secondary | ICD-10-CM | POA: Diagnosis not present

## 2016-04-16 ENCOUNTER — Other Ambulatory Visit
Admission: RE | Admit: 2016-04-16 | Discharge: 2016-04-16 | Disposition: A | Discharge status: Home / Self Care | Payer: Medicare Other | Source: Ambulatory Visit | Attending: Family Medicine | Admitting: Family Medicine

## 2016-04-16 DIAGNOSIS — E875 Hyperkalemia: Secondary | ICD-10-CM | POA: Diagnosis not present

## 2016-04-16 LAB — POTASSIUM: Potassium: 3.7 mmol/L (ref 3.5–5.1)

## 2016-04-17 ENCOUNTER — Ambulatory Visit: Admit: 2016-04-17 | Payer: Medicare Other | Admitting: Vascular Surgery

## 2016-04-17 ENCOUNTER — Encounter: Payer: Self-pay | Admitting: *Deleted

## 2016-04-17 ENCOUNTER — Other Ambulatory Visit (INDEPENDENT_AMBULATORY_CARE_PROVIDER_SITE_OTHER): Payer: Self-pay | Admitting: Vascular Surgery

## 2016-04-17 ENCOUNTER — Encounter
Admission: RE | Disposition: A | Discharge status: Home / Self Care | Payer: Self-pay | Source: Ambulatory Visit | Attending: Vascular Surgery

## 2016-04-17 ENCOUNTER — Ambulatory Visit
Admission: RE | Admit: 2016-04-17 | Discharge: 2016-04-17 | Disposition: A | Discharge status: Home / Self Care | Payer: Medicare Other | Source: Ambulatory Visit | Attending: Vascular Surgery | Admitting: Vascular Surgery

## 2016-04-17 DIAGNOSIS — I132 Hypertensive heart and chronic kidney disease with heart failure and with stage 5 chronic kidney disease, or end stage renal disease: Secondary | ICD-10-CM | POA: Insufficient documentation

## 2016-04-17 DIAGNOSIS — Z8049 Family history of malignant neoplasm of other genital organs: Secondary | ICD-10-CM | POA: Insufficient documentation

## 2016-04-17 DIAGNOSIS — E1122 Type 2 diabetes mellitus with diabetic chronic kidney disease: Secondary | ICD-10-CM | POA: Diagnosis not present

## 2016-04-17 DIAGNOSIS — Z90710 Acquired absence of both cervix and uterus: Secondary | ICD-10-CM | POA: Insufficient documentation

## 2016-04-17 DIAGNOSIS — E039 Hypothyroidism, unspecified: Secondary | ICD-10-CM | POA: Insufficient documentation

## 2016-04-17 DIAGNOSIS — Z823 Family history of stroke: Secondary | ICD-10-CM | POA: Insufficient documentation

## 2016-04-17 DIAGNOSIS — Z9981 Dependence on supplemental oxygen: Secondary | ICD-10-CM | POA: Insufficient documentation

## 2016-04-17 DIAGNOSIS — Z8249 Family history of ischemic heart disease and other diseases of the circulatory system: Secondary | ICD-10-CM | POA: Insufficient documentation

## 2016-04-17 DIAGNOSIS — N289 Disorder of kidney and ureter, unspecified: Secondary | ICD-10-CM | POA: Diagnosis not present

## 2016-04-17 DIAGNOSIS — Z933 Colostomy status: Secondary | ICD-10-CM | POA: Insufficient documentation

## 2016-04-17 DIAGNOSIS — Z951 Presence of aortocoronary bypass graft: Secondary | ICD-10-CM | POA: Insufficient documentation

## 2016-04-17 DIAGNOSIS — I2510 Atherosclerotic heart disease of native coronary artery without angina pectoris: Secondary | ICD-10-CM | POA: Diagnosis not present

## 2016-04-17 DIAGNOSIS — Z8619 Personal history of other infectious and parasitic diseases: Secondary | ICD-10-CM | POA: Insufficient documentation

## 2016-04-17 DIAGNOSIS — I714 Abdominal aortic aneurysm, without rupture: Secondary | ICD-10-CM | POA: Insufficient documentation

## 2016-04-17 DIAGNOSIS — K5790 Diverticulosis of intestine, part unspecified, without perforation or abscess without bleeding: Secondary | ICD-10-CM | POA: Diagnosis not present

## 2016-04-17 DIAGNOSIS — D649 Anemia, unspecified: Secondary | ICD-10-CM | POA: Insufficient documentation

## 2016-04-17 DIAGNOSIS — Z992 Dependence on renal dialysis: Secondary | ICD-10-CM | POA: Insufficient documentation

## 2016-04-17 DIAGNOSIS — E538 Deficiency of other specified B group vitamins: Secondary | ICD-10-CM | POA: Insufficient documentation

## 2016-04-17 DIAGNOSIS — N186 End stage renal disease: Secondary | ICD-10-CM | POA: Insufficient documentation

## 2016-04-17 DIAGNOSIS — J449 Chronic obstructive pulmonary disease, unspecified: Secondary | ICD-10-CM | POA: Insufficient documentation

## 2016-04-17 DIAGNOSIS — E785 Hyperlipidemia, unspecified: Secondary | ICD-10-CM | POA: Insufficient documentation

## 2016-04-17 DIAGNOSIS — Z87891 Personal history of nicotine dependence: Secondary | ICD-10-CM | POA: Diagnosis not present

## 2016-04-17 DIAGNOSIS — I5030 Unspecified diastolic (congestive) heart failure: Secondary | ICD-10-CM | POA: Insufficient documentation

## 2016-04-17 DIAGNOSIS — Y832 Surgical operation with anastomosis, bypass or graft as the cause of abnormal reaction of the patient, or of later complication, without mention of misadventure at the time of the procedure: Secondary | ICD-10-CM | POA: Diagnosis not present

## 2016-04-17 DIAGNOSIS — Z888 Allergy status to other drugs, medicaments and biological substances status: Secondary | ICD-10-CM | POA: Insufficient documentation

## 2016-04-17 DIAGNOSIS — Z886 Allergy status to analgesic agent status: Secondary | ICD-10-CM | POA: Insufficient documentation

## 2016-04-17 DIAGNOSIS — T82590A Other mechanical complication of surgically created arteriovenous fistula, initial encounter: Secondary | ICD-10-CM | POA: Diagnosis not present

## 2016-04-17 HISTORY — PX: PERIPHERAL VASCULAR CATHETERIZATION: SHX172C

## 2016-04-17 SURGERY — PERIPHERAL VASCULAR THROMBECTOMY
Anesthesia: Moderate Sedation | Site: Arm Upper

## 2016-04-17 SURGERY — PERIPHERAL VASCULAR THROMBECTOMY
Anesthesia: Moderate Sedation | Laterality: Left

## 2016-04-17 MED ORDER — HEPARIN (PORCINE) IN NACL 2-0.9 UNIT/ML-% IJ SOLN
INTRAMUSCULAR | Status: AC
  Filled 2016-04-17: qty 1000

## 2016-04-17 MED ORDER — ONDANSETRON HCL 4 MG/2ML IJ SOLN: 4.0000 mg | Freq: Four times a day (QID) | INTRAMUSCULAR | Status: DC | PRN

## 2016-04-17 MED ORDER — LIDOCAINE HCL (PF) 1 % IJ SOLN
INTRAMUSCULAR | Status: AC
  Filled 2016-04-17: qty 30

## 2016-04-17 MED ORDER — ALTEPLASE 2 MG IJ SOLR
INTRAMUSCULAR | Status: AC
  Filled 2016-04-17: qty 2

## 2016-04-17 MED ORDER — ALTEPLASE 2 MG IJ SOLR
INTRAMUSCULAR | Status: DC | PRN
  Administered 2016-04-17: 2 mg

## 2016-04-17 MED ORDER — MIDAZOLAM HCL 2 MG/2ML IJ SOLN
INTRAMUSCULAR | Status: DC | PRN
  Administered 2016-04-17 (×2): 2 mg via INTRAVENOUS
  Administered 2016-04-17: 1 mg via INTRAVENOUS

## 2016-04-17 MED ORDER — CEFAZOLIN SODIUM-DEXTROSE 2-4 GM/100ML-% IV SOLN: 2.0000 g | INTRAVENOUS | Status: DC

## 2016-04-17 MED ORDER — DOXYCYCLINE HYCLATE 100 MG PO TABS: 100.0000 mg | ORAL_TABLET | ORAL | Status: DC

## 2016-04-17 MED ORDER — SODIUM CHLORIDE 0.9 % IV SOLN: INTRAVENOUS | Status: DC

## 2016-04-17 MED ORDER — CEFAZOLIN IN D5W 1 GM/50ML IV SOLN: 1.0000 g | INTRAVENOUS | Status: DC

## 2016-04-17 MED ORDER — SODIUM CHLORIDE 0.9 % IV SOLN
INTRAVENOUS | Status: DC
  Administered 2016-04-17: 16:00:00 via INTRAVENOUS

## 2016-04-17 MED ORDER — DIPHENHYDRAMINE HCL 50 MG/ML IJ SOLN
INTRAMUSCULAR | Status: DC | PRN
  Administered 2016-04-17: 50 mg via INTRAVENOUS

## 2016-04-17 MED ORDER — DOXYCYCLINE HYCLATE 50 MG PO CAPS: 50.0000 mg | ORAL_CAPSULE | Freq: Two times a day (BID) | ORAL | 0 refills | Status: DC

## 2016-04-17 MED ORDER — FAMOTIDINE 20 MG PO TABS: 40.0000 mg | ORAL_TABLET | ORAL | Status: DC | PRN

## 2016-04-17 MED ORDER — HYDROMORPHONE HCL 1 MG/ML IJ SOLN
1.0000 mg | Freq: Once | INTRAMUSCULAR | Status: DC
Start: 2016-04-17 — End: 2016-04-17

## 2016-04-17 MED ORDER — MIDAZOLAM HCL 5 MG/5ML IJ SOLN
INTRAMUSCULAR | Status: AC
  Filled 2016-04-17: qty 5

## 2016-04-17 MED ORDER — CEFAZOLIN IN D5W 1 GM/50ML IV SOLN
INTRAVENOUS | Status: AC
  Filled 2016-04-17: qty 50

## 2016-04-17 MED ORDER — METHYLPREDNISOLONE SODIUM SUCC 125 MG IJ SOLR: 125.0000 mg | INTRAMUSCULAR | Status: DC | PRN

## 2016-04-17 MED ORDER — DIPHENHYDRAMINE HCL 50 MG/ML IJ SOLN
INTRAMUSCULAR | Status: AC
  Filled 2016-04-17: qty 1

## 2016-04-17 MED ORDER — MIDAZOLAM HCL 2 MG/2ML IJ SOLN
INTRAMUSCULAR | Status: AC
  Filled 2016-04-17: qty 2

## 2016-04-17 SURGICAL SUPPLY — 9 items
CATH PALINDROME RT-P 15FX19CM (CATHETERS) ×2 IMPLANT
NDL ENTRY 21GA 7CM ECHOTIP (NEEDLE) IMPLANT
NEEDLE ENTRY 21GA 7CM ECHOTIP (NEEDLE) ×3 IMPLANT
PACK ANGIOGRAPHY (CUSTOM PROCEDURE TRAY) ×3 IMPLANT
SET INTRO CAPELLA COAXIAL (SET/KITS/TRAYS/PACK) ×2 IMPLANT
SHEATH BRITE TIP 6FRX5.5 (SHEATH) ×2 IMPLANT
SYR MEDRAD MARK V 150ML (SYRINGE) ×3 IMPLANT
TUBING CONTRAST HIGH PRESS 72 (TUBING) ×3 IMPLANT
WIRE J 3MM .035X145CM (WIRE) ×3 IMPLANT

## 2016-04-17 NOTE — Op Note (Signed)
[name]   OPERATIVE NOTE     PROCEDURE: 1. Insertion of a right IJ tunneled dialysis catheter. 2. Catheter placement and cannulation under ultrasound and fluoroscopic guidance  PRE-OPERATIVE DIAGNOSIS: end-stage renal requiring hemodialysis  POST-OPERATIVE DIAGNOSIS: same as above  SURGEON: Hortencia Pilar  ANESTHESIA: Conscious sedation was administered under my direct supervision. IV Versed plus fentanyl were utilized. Continuous ECG, pulse oximetry and blood pressure was monitored throughout the entire procedure.  Conscious sedation was for a total of 30 minutes.  ESTIMATED BLOOD LOSS: Minimal  FINDING(S): 1.  Tips of the catheter in the right atrium on fluoroscopy 2.  No obvious pneumothorax on fluoroscopy  SPECIMEN(S):  none  INDICATIONS:   [name] is a 66 y.o. female  presents with end stage renal disease.  Therefore, the patient requires a tunneled dialysis catheter placement.  The patient is informed of  the risks catheter placement include but are not limited to: bleeding, infection, central venous injury, pneumothorax, possible venous stenosis, possible malpositioning in the venous system, and possible infections related to long-term catheter presence.  The patient was aware of these risks and agreed to proceed.  DESCRIPTION: The patient was taken back to Special Procedure suite.  Prior to sedation, the patient was given IV antibiotics.  After obtaining adequate sedation, the patient was prepped and draped in the standard fashion for a chest or neck tunneled dialysis catheter placement.  Appropriate Time Out is called.   The the right neck and chest wall are then infiltrated with 1% Lidocaine with epinepherine.  A 19 cm tip to cuff palindrome catheter is then selected, opened on the back table and prepped. Under ultrasound guidance, the right internal jugular vein was cannulated with the Seldinger needle.  A J-wire was then advanced under  fluoroscopic guidance into the inferior vena cava and the wire was secured.  Small counter incision was then made at the wire insertion site. A small pocket was fashioned with blunt dissection to allow easier passage of the cuff.  The dilator and peel-away sheath are then advanced over the wire under fluoroscopic guidance. The catheters and advanced through the peel-away sheath after removal of the wire. It is approximated to the chest wall after verifying the tips at the atrial caval junction and an exit site is selected.  Small incision is made at the selected exit site and the tunneling device was passed subcutaneously to the neck counter incision. Catheter is then pulled through the subcutaneous tunnel. The catheter is then verified for tip position under fluoroscopy, transected and the hub assembly connected.    Each port was tested by aspirating and flushing.  No resistance was noted.  Each port was then thoroughly flushed with heparinized saline.  The catheter was secured in placed with two interrupted stitches of 0 silk tied to the catheter.  The neck incision was closed with a U-stitch of 4-0 Monocryl.  The neck and chest incision were cleaned and sterile bandages applied including a Biopatch.  Each port was then packed with concentrated heparin (1000 Units/mL) at the manufacturer recommended volumes to each port.  Sterile caps were applied to each port.  On completion fluoroscopy, the tips of the catheter were in the right atrium, and there was no evidence of pneumothorax.   Of note: Prior to placing the catheter I evaluated her left brachial axillary dialysis graft as noted in the H&P this is thrombosed. However there appears to be an area over the graft that is  suspicious for infection. I attempted to aspirate a fluctuant area but did not receive any pus back. Nevertheless, I do not believe that thrombectomy is indicated at this point given my concern for a graft infection. I will discharge her  on antibiotics and see her back in 1 week.   COMPLICATIONS: None  CONDITION: Unchanged   Hortencia Pilar [facility] Office: [phone]   04/17/2016, 4:38 PM
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
## 2016-04-18 DIAGNOSIS — Z992 Dependence on renal dialysis: Secondary | ICD-10-CM | POA: Diagnosis not present

## 2016-04-18 DIAGNOSIS — D631 Anemia in chronic kidney disease: Secondary | ICD-10-CM | POA: Diagnosis not present

## 2016-04-18 DIAGNOSIS — D509 Iron deficiency anemia, unspecified: Secondary | ICD-10-CM | POA: Diagnosis not present

## 2016-04-18 DIAGNOSIS — N186 End stage renal disease: Secondary | ICD-10-CM | POA: Diagnosis not present

## 2016-04-18 DIAGNOSIS — Z23 Encounter for immunization: Secondary | ICD-10-CM | POA: Diagnosis not present

## 2016-04-20 ENCOUNTER — Ambulatory Visit (INDEPENDENT_AMBULATORY_CARE_PROVIDER_SITE_OTHER): Payer: Medicare Other | Admitting: Vascular Surgery

## 2016-04-20 ENCOUNTER — Other Ambulatory Visit (INDEPENDENT_AMBULATORY_CARE_PROVIDER_SITE_OTHER): Payer: Self-pay | Admitting: Vascular Surgery

## 2016-04-20 ENCOUNTER — Encounter (INDEPENDENT_AMBULATORY_CARE_PROVIDER_SITE_OTHER): Payer: Medicare Other

## 2016-04-20 ENCOUNTER — Encounter (INDEPENDENT_AMBULATORY_CARE_PROVIDER_SITE_OTHER): Payer: Self-pay | Admitting: Vascular Surgery

## 2016-04-20 ENCOUNTER — Encounter (INDEPENDENT_AMBULATORY_CARE_PROVIDER_SITE_OTHER): Payer: Self-pay

## 2016-04-20 VITALS — BP 148/73 | HR 83 | Resp 16 | Wt 115.0 lb

## 2016-04-20 DIAGNOSIS — Z992 Dependence on renal dialysis: Secondary | ICD-10-CM | POA: Diagnosis not present

## 2016-04-20 DIAGNOSIS — T829XXS Unspecified complication of cardiac and vascular prosthetic device, implant and graft, sequela: Secondary | ICD-10-CM

## 2016-04-20 DIAGNOSIS — I10 Essential (primary) hypertension: Secondary | ICD-10-CM

## 2016-04-20 DIAGNOSIS — I713 Abdominal aortic aneurysm, ruptured, unspecified: Secondary | ICD-10-CM

## 2016-04-20 DIAGNOSIS — N186 End stage renal disease: Secondary | ICD-10-CM

## 2016-04-20 DIAGNOSIS — J438 Other emphysema: Secondary | ICD-10-CM | POA: Diagnosis not present
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
## 2016-04-21 ENCOUNTER — Encounter: Payer: Self-pay | Admitting: Vascular Surgery

## 2016-04-21 DIAGNOSIS — Z23 Encounter for immunization: Secondary | ICD-10-CM | POA: Diagnosis not present

## 2016-04-21 DIAGNOSIS — D631 Anemia in chronic kidney disease: Secondary | ICD-10-CM | POA: Diagnosis not present

## 2016-04-21 DIAGNOSIS — Z992 Dependence on renal dialysis: Secondary | ICD-10-CM | POA: Diagnosis not present

## 2016-04-21 DIAGNOSIS — N186 End stage renal disease: Secondary | ICD-10-CM | POA: Diagnosis not present

## 2016-04-21 DIAGNOSIS — D509 Iron deficiency anemia, unspecified: Secondary | ICD-10-CM | POA: Diagnosis not present

## 2016-04-23 DIAGNOSIS — N186 End stage renal disease: Secondary | ICD-10-CM | POA: Diagnosis not present

## 2016-04-23 DIAGNOSIS — Z992 Dependence on renal dialysis: Secondary | ICD-10-CM | POA: Diagnosis not present

## 2016-04-23 DIAGNOSIS — D631 Anemia in chronic kidney disease: Secondary | ICD-10-CM | POA: Diagnosis not present

## 2016-04-23 DIAGNOSIS — Z23 Encounter for immunization: Secondary | ICD-10-CM | POA: Diagnosis not present

## 2016-04-23 DIAGNOSIS — D509 Iron deficiency anemia, unspecified: Secondary | ICD-10-CM | POA: Diagnosis not present

## 2016-04-23 MED ORDER — CEFAZOLIN IN D5W 1 GM/50ML IV SOLN
1.0000 g | Freq: Once | INTRAVENOUS | Status: AC
  Administered 2016-04-24: 1 g via INTRAVENOUS

## 2016-04-24 ENCOUNTER — Ambulatory Visit
Admission: RE | Admit: 2016-04-24 | Discharge: 2016-04-24 | Disposition: A | Discharge status: Home / Self Care | Payer: Medicare Other | Source: Ambulatory Visit | Attending: Vascular Surgery | Admitting: Vascular Surgery

## 2016-04-24 ENCOUNTER — Encounter
Admission: RE | Disposition: A | Discharge status: Home / Self Care | Payer: Self-pay | Source: Ambulatory Visit | Attending: Vascular Surgery

## 2016-04-24 DIAGNOSIS — I5032 Chronic diastolic (congestive) heart failure: Secondary | ICD-10-CM | POA: Insufficient documentation

## 2016-04-24 DIAGNOSIS — Z95828 Presence of other vascular implants and grafts: Secondary | ICD-10-CM | POA: Insufficient documentation

## 2016-04-24 DIAGNOSIS — Z886 Allergy status to analgesic agent status: Secondary | ICD-10-CM | POA: Diagnosis not present

## 2016-04-24 DIAGNOSIS — T82868A Thrombosis of vascular prosthetic devices, implants and grafts, initial encounter: Secondary | ICD-10-CM | POA: Diagnosis not present

## 2016-04-24 DIAGNOSIS — E785 Hyperlipidemia, unspecified: Secondary | ICD-10-CM | POA: Insufficient documentation

## 2016-04-24 DIAGNOSIS — N186 End stage renal disease: Secondary | ICD-10-CM | POA: Insufficient documentation

## 2016-04-24 DIAGNOSIS — Z8619 Personal history of other infectious and parasitic diseases: Secondary | ICD-10-CM | POA: Insufficient documentation

## 2016-04-24 DIAGNOSIS — Z992 Dependence on renal dialysis: Secondary | ICD-10-CM | POA: Diagnosis not present

## 2016-04-24 DIAGNOSIS — Z8049 Family history of malignant neoplasm of other genital organs: Secondary | ICD-10-CM | POA: Insufficient documentation

## 2016-04-24 DIAGNOSIS — J449 Chronic obstructive pulmonary disease, unspecified: Secondary | ICD-10-CM | POA: Insufficient documentation

## 2016-04-24 DIAGNOSIS — Z9981 Dependence on supplemental oxygen: Secondary | ICD-10-CM | POA: Diagnosis not present

## 2016-04-24 DIAGNOSIS — Z90710 Acquired absence of both cervix and uterus: Secondary | ICD-10-CM | POA: Diagnosis not present

## 2016-04-24 DIAGNOSIS — E1122 Type 2 diabetes mellitus with diabetic chronic kidney disease: Secondary | ICD-10-CM | POA: Insufficient documentation

## 2016-04-24 DIAGNOSIS — Z823 Family history of stroke: Secondary | ICD-10-CM | POA: Insufficient documentation

## 2016-04-24 DIAGNOSIS — K219 Gastro-esophageal reflux disease without esophagitis: Secondary | ICD-10-CM | POA: Insufficient documentation

## 2016-04-24 DIAGNOSIS — E538 Deficiency of other specified B group vitamins: Secondary | ICD-10-CM | POA: Diagnosis not present

## 2016-04-24 DIAGNOSIS — Z8249 Family history of ischemic heart disease and other diseases of the circulatory system: Secondary | ICD-10-CM | POA: Diagnosis not present

## 2016-04-24 DIAGNOSIS — E039 Hypothyroidism, unspecified: Secondary | ICD-10-CM | POA: Diagnosis not present

## 2016-04-24 DIAGNOSIS — Z933 Colostomy status: Secondary | ICD-10-CM | POA: Diagnosis not present

## 2016-04-24 DIAGNOSIS — Y832 Surgical operation with anastomosis, bypass or graft as the cause of abnormal reaction of the patient, or of later complication, without mention of misadventure at the time of the procedure: Secondary | ICD-10-CM | POA: Diagnosis not present

## 2016-04-24 DIAGNOSIS — Z888 Allergy status to other drugs, medicaments and biological substances status: Secondary | ICD-10-CM | POA: Insufficient documentation

## 2016-04-24 DIAGNOSIS — I2510 Atherosclerotic heart disease of native coronary artery without angina pectoris: Secondary | ICD-10-CM | POA: Insufficient documentation

## 2016-04-24 DIAGNOSIS — I132 Hypertensive heart and chronic kidney disease with heart failure and with stage 5 chronic kidney disease, or end stage renal disease: Secondary | ICD-10-CM | POA: Diagnosis not present

## 2016-04-24 DIAGNOSIS — Z87891 Personal history of nicotine dependence: Secondary | ICD-10-CM | POA: Diagnosis not present

## 2016-04-24 DIAGNOSIS — Z8679 Personal history of other diseases of the circulatory system: Secondary | ICD-10-CM | POA: Diagnosis not present

## 2016-04-24 HISTORY — PX: PERIPHERAL VASCULAR CATHETERIZATION: SHX172C

## 2016-04-24 LAB — POTASSIUM (ARMC VASCULAR LAB ONLY): POTASSIUM (ARMC VASCULAR LAB): 4 (ref 3.5–5.1)

## 2016-04-24 SURGERY — PERIPHERAL VASCULAR THROMBECTOMY
Anesthesia: Moderate Sedation | Site: Arm Lower | Laterality: Left

## 2016-04-24 MED ORDER — ALTEPLASE 2 MG IJ SOLR
INTRAMUSCULAR | Status: AC
  Filled 2016-04-24: qty 6

## 2016-04-24 MED ORDER — IOPAMIDOL (ISOVUE-300) INJECTION 61%
INTRAVENOUS | Status: DC | PRN
  Administered 2016-04-24: 65 mL via INTRAVENOUS

## 2016-04-24 MED ORDER — SODIUM CHLORIDE 0.9 % IV SOLN: INTRAVENOUS | Status: DC

## 2016-04-24 MED ORDER — METHYLPREDNISOLONE SODIUM SUCC 125 MG IJ SOLR: 125.0000 mg | INTRAMUSCULAR | Status: DC | PRN

## 2016-04-24 MED ORDER — LIDOCAINE HCL (PF) 1 % IJ SOLN
INTRAMUSCULAR | Status: AC
  Filled 2016-04-24: qty 30

## 2016-04-24 MED ORDER — FENTANYL CITRATE (PF) 100 MCG/2ML IJ SOLN
INTRAMUSCULAR | Status: AC
  Filled 2016-04-24: qty 2

## 2016-04-24 MED ORDER — BIVALIRUDIN BOLUS VIA INFUSION
0.5000 mg/kg | Freq: Once | INTRAVENOUS | Status: DC
  Filled 2016-04-24: qty 27

## 2016-04-24 MED ORDER — MIDAZOLAM HCL 5 MG/5ML IJ SOLN
INTRAMUSCULAR | Status: AC
  Filled 2016-04-24: qty 5

## 2016-04-24 MED ORDER — BIVALIRUDIN 250 MG IV SOLR
INTRAVENOUS | Status: AC
  Filled 2016-04-24: qty 250

## 2016-04-24 MED ORDER — SODIUM CHLORIDE 0.9 % IV SOLN: INTRAVENOUS | Status: DC | PRN

## 2016-04-24 MED ORDER — MIDAZOLAM HCL 2 MG/2ML IJ SOLN
INTRAMUSCULAR | Status: AC
  Filled 2016-04-24: qty 2

## 2016-04-24 MED ORDER — ALTEPLASE 2 MG IJ SOLR
INTRAMUSCULAR | Status: DC | PRN
Start: 2016-04-24 — End: 2016-04-24
  Administered 2016-04-24: 6 mg

## 2016-04-24 MED ORDER — HEPARIN SODIUM (PORCINE) 1000 UNIT/ML IJ SOLN
INTRAMUSCULAR | Status: AC
  Filled 2016-04-24: qty 1

## 2016-04-24 MED ORDER — FAMOTIDINE 20 MG PO TABS: 40.0000 mg | ORAL_TABLET | ORAL | Status: DC | PRN

## 2016-04-24 MED ORDER — MIDAZOLAM HCL 2 MG/2ML IJ SOLN
INTRAMUSCULAR | Status: DC | PRN
  Administered 2016-04-24 (×7): 1 mg via INTRAVENOUS

## 2016-04-24 MED ORDER — BIVALIRUDIN BOLUS VIA INFUSION: 0.1000 mg/kg | Freq: Once | INTRAVENOUS | Status: DC

## 2016-04-24 MED ORDER — FENTANYL CITRATE (PF) 100 MCG/2ML IJ SOLN
INTRAMUSCULAR | Status: DC | PRN
  Administered 2016-04-24: 25 ug via INTRAVENOUS
  Administered 2016-04-24 (×4): 50 ug via INTRAVENOUS
  Administered 2016-04-24 (×2): 25 ug via INTRAVENOUS

## 2016-04-24 SURGICAL SUPPLY — 29 items
BALLN DORADO 5X40X80 (BALLOONS) ×3
BALLN DORADO 6X60X80 (BALLOONS) ×3
BALLN DORADO 8X60X80 (BALLOONS) ×3
BALLN LUTONIX AV 8X60X75 (BALLOONS) ×2
BALLOON DORADO 5X40X80 (BALLOONS) IMPLANT
BALLOON DORADO 6X60X80 (BALLOONS) IMPLANT
BALLOON DORADO 8X60X80 (BALLOONS) IMPLANT
BALLOON LUTONIX AV 8X60X75 (BALLOONS) IMPLANT
CATH TORCON 5FR 0.38 (CATHETERS) ×2 IMPLANT
DEVICE PRESTO INFLATION (MISCELLANEOUS) ×2 IMPLANT
DEVICE TORQUE (MISCELLANEOUS) ×2 IMPLANT
DRAPE BRACHIAL (DRAPES) ×2 IMPLANT
GUIDEWIRE ANGLED .035 180CM (WIRE) ×2 IMPLANT
KIT THROMB PERC PTD (MISCELLANEOUS) ×2 IMPLANT
NDL ENTRY 21GA 7CM ECHOTIP (NEEDLE) IMPLANT
NEEDLE ENTRY 21GA 7CM ECHOTIP (NEEDLE) ×3 IMPLANT
PACK ANGIOGRAPHY (CUSTOM PROCEDURE TRAY) ×3 IMPLANT
SET AVX THROMB ULT (MISCELLANEOUS) ×2 IMPLANT
SET INTRO CAPELLA COAXIAL (SET/KITS/TRAYS/PACK) ×2 IMPLANT
SHEATH BRITE TIP 6FRX5.5 (SHEATH) ×4 IMPLANT
SHEATH BRITE TIP 7FRX5.5 (SHEATH) ×2 IMPLANT
SHIELD RADPAD DADD DRAPE 4X9 (MISCELLANEOUS) ×2 IMPLANT
STENT VIABAHN 8X50X120 (Permanent Stent) ×2 IMPLANT
STENT VIABAHN5X120X8X (Permanent Stent) ×1 IMPLANT
SUT MNCRL AB 4-0 PS2 18 (SUTURE) ×2 IMPLANT
TOWEL OR 17X26 4PK STRL BLUE (TOWEL DISPOSABLE) ×2 IMPLANT
WIRE G V18X300CM (WIRE) ×2 IMPLANT
WIRE J 3MM .035X145CM (WIRE) ×3 IMPLANT
WIRE MAGIC TOR.035 180C (WIRE) ×2 IMPLANT

## 2016-04-24 NOTE — Op Note (Signed)
OPERATIVE NOTE   PROCEDURE: 1. Contrast injection left brachial axillary dialysis graft 2. Mechanical thrombectomy left radial axillary dialysis graft with both Trerotola and AngioJet 3. Infusion of 6 mg of TPA for thrombolysis left brachial axillary dialysis graft 4. Percutaneous transluminal and plasty and stent placement left brachial axillary dialysis graft  PRE-OPERATIVE DIAGNOSIS: Complication of dialysis access                                                       End Stage Renal Disease  POST-OPERATIVE DIAGNOSIS: same as above   SURGEON: Katha Cabal, M.D.  ANESTHESIA: Conscious Sedation   ESTIMATED BLOOD LOSS: minimal  FINDING(S): 1. The graft is thrombosed, in the area of arterial cannulation there is noted to be a rather long defect in the graft with the graft crushed more proximally. This is associated with the thrombosed pseudoaneurysm by examination.  SPECIMEN(S):  None  CONTRAST: 65 cc  FLUOROSCOPY TIME: 18.4 minutes  INDICATIONS: [name] is a 66 y.o. female who  presents with a thrombosed AV access.  The patient is scheduled for angiography with possible intervention of the AV access.  The patient is aware the risks include but are not limited to: bleeding, infection, thrombosis of the cannulated access, and possible anaphylactic reaction to the contrast.  The patient acknowledges if the access can not be salvaged a tunneled catheter will be needed and will be placed during this procedure.  The patient is aware of the risks of the procedure and elects to proceed with the angiogram and intervention.  DESCRIPTION: After full informed written consent was obtained, the patient was brought back to the Special Procedure suite and placed supine position.  Appropriate cardiopulmonary monitors were placed.  The left arm was prepped and draped in the standard fashion.  Appropriate timeout is called. The left AV graft  was cannulated with a micropuncture needle.   The microwire was advanced and the needle was exchanged for  a microsheath.  The J-wire was then advanced and a 6 Fr sheath inserted.  Hand was then performed which demonstrated thrombus within the AV access.  It then took quite a bit of time working with a floppy Glidewire and a Kumpe catheter to negotiate the wire and catheter across the thrombosed pseudoaneurysm once this was achieved the central venous structures were also imaged by hand injections.  Based on the images, I did not want to lose wire access considering how hard it was to cross that one particular lesion so a AngioJet catheter was advanced to the proximal portion of the AV access and a total of 6 mg of TPA reconstituted in 25 cc was then infused throughout the length of the access saving approximately 10-15 cc for the arterial portion after the second sheath had been started. This initial lacing of the access was then allowed to dwell for approximately 20-30 minutes.  A half bolus of Angiomax was given and allowed to circulate as well.  After the appropriate dwell time, hand injection contrast demonstrated significant residual thrombus the sheath was then upsized to a 7 French sheath and a VAT wire was then exchanged for the Magic torque wire. An 8 x 50 Viabahn stent was then deployed across the pseudoaneurysm. It was postdilated with an 8 mm Dorado balloon to 20 atm for 1 minute.  After this a Trerotola device was then advanced beginning centrally and pulling back performing.  Several passes were made through the venous portion of the graft. Follow-up imaging now demonstrates the vast majority of the clot had been treated. Therefore a retrograde sheath was inserted. This too was a 6 [place] sheath was positioned more proximally on the arm and angled in the retrograde direction. Subsequently a floppy Glidewire and a KMP catheter were negotiated into the arterial system hand injection contrast was then utilized to demonstrate patency of the  artery as well as the location for the anastomosis. First a 5 mm balloon and then a 6 mm were balloon were used and plasty the arterial portion of the graft and then a Trerotola device was advanced through the retrograde sheath was extended out into the artery the basket was opened and it was slowly pulled back into the graft and then the basket was engaged. Several passes were made on the arterial portion and pulsatility of the access was reestablished. Follow-up imaging demonstrates there was now thrombus in the venous portion surrounding the sheath and this was treated with the Trerotola device from the antegrade direction. After several passes imaging demonstrated resolution of thrombus within the graft and forward flow however stricture of the graft was noted area  Magic torque wire was then advanced through the antegrade sheath and an 8 x 6 Dorado balloon was used to angioplasty the venous portion of the AV access at the venous anastomosis. Multiple inflations were performed inflation times with 30 seconds to 1 minute with maximum pressures of 20 ATM.  With the balloon inflated reflux of contrast was performed demonstrating the arterial anastomosis. The arterial anastomosis was patent. Contrast was then injected in the forward direction demonstrating forward flow.  A 4-0 Monocryl purse-string suture was sewn around both of the sheaths.  The sheaths were removed and light pressure was applied.  A sterile bandage was applied to the puncture site.    COMPLICATIONS: None  CONDITION: Improved  Katha Cabal, M.D [facility] Office: [phone]  04/24/2016 3:58 PM

## 2016-04-24 NOTE — Progress Notes (Signed)
Pt here today for fistulogram left arm,alert and oriented, vitals stable,will monitor vitals as well as etco2 during and throughout entire procedure.

## 2016-04-25 DIAGNOSIS — Z992 Dependence on renal dialysis: Secondary | ICD-10-CM | POA: Diagnosis not present

## 2016-04-25 DIAGNOSIS — N186 End stage renal disease: Secondary | ICD-10-CM | POA: Diagnosis not present

## 2016-04-25 DIAGNOSIS — D631 Anemia in chronic kidney disease: Secondary | ICD-10-CM | POA: Diagnosis not present

## 2016-04-25 DIAGNOSIS — D509 Iron deficiency anemia, unspecified: Secondary | ICD-10-CM | POA: Diagnosis not present

## 2016-04-25 DIAGNOSIS — Z23 Encounter for immunization: Secondary | ICD-10-CM | POA: Diagnosis not present
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
## 2016-04-27 ENCOUNTER — Ambulatory Visit (INDEPENDENT_AMBULATORY_CARE_PROVIDER_SITE_OTHER): Payer: Medicare Other | Admitting: Vascular Surgery

## 2016-04-28 DIAGNOSIS — Z992 Dependence on renal dialysis: Secondary | ICD-10-CM | POA: Diagnosis not present

## 2016-04-28 DIAGNOSIS — Z23 Encounter for immunization: Secondary | ICD-10-CM | POA: Diagnosis not present

## 2016-04-28 DIAGNOSIS — D631 Anemia in chronic kidney disease: Secondary | ICD-10-CM | POA: Diagnosis not present

## 2016-04-28 DIAGNOSIS — D509 Iron deficiency anemia, unspecified: Secondary | ICD-10-CM | POA: Diagnosis not present

## 2016-04-28 DIAGNOSIS — N186 End stage renal disease: Secondary | ICD-10-CM | POA: Diagnosis not present

## 2016-04-29 ENCOUNTER — Encounter: Payer: Self-pay | Admitting: Vascular Surgery

## 2016-04-29 DIAGNOSIS — Z992 Dependence on renal dialysis: Secondary | ICD-10-CM | POA: Diagnosis not present

## 2016-04-29 DIAGNOSIS — N186 End stage renal disease: Secondary | ICD-10-CM | POA: Diagnosis not present

## 2016-04-30 DIAGNOSIS — N186 End stage renal disease: Secondary | ICD-10-CM | POA: Diagnosis not present

## 2016-04-30 DIAGNOSIS — Z992 Dependence on renal dialysis: Secondary | ICD-10-CM | POA: Diagnosis not present

## 2016-04-30 DIAGNOSIS — Z23 Encounter for immunization: Secondary | ICD-10-CM | POA: Diagnosis not present

## 2016-05-02 DIAGNOSIS — Z992 Dependence on renal dialysis: Secondary | ICD-10-CM | POA: Diagnosis not present

## 2016-05-02 DIAGNOSIS — Z23 Encounter for immunization: Secondary | ICD-10-CM | POA: Diagnosis not present

## 2016-05-02 DIAGNOSIS — N186 End stage renal disease: Secondary | ICD-10-CM | POA: Diagnosis not present

## 2016-05-05 DIAGNOSIS — Z992 Dependence on renal dialysis: Secondary | ICD-10-CM | POA: Diagnosis not present

## 2016-05-05 DIAGNOSIS — N186 End stage renal disease: Secondary | ICD-10-CM | POA: Diagnosis not present

## 2016-05-05 DIAGNOSIS — Z23 Encounter for immunization: Secondary | ICD-10-CM | POA: Diagnosis not present

## 2016-05-07 DIAGNOSIS — Z992 Dependence on renal dialysis: Secondary | ICD-10-CM | POA: Diagnosis not present

## 2016-05-07 DIAGNOSIS — Z23 Encounter for immunization: Secondary | ICD-10-CM | POA: Diagnosis not present

## 2016-05-07 DIAGNOSIS — N186 End stage renal disease: Secondary | ICD-10-CM | POA: Diagnosis not present

## 2016-05-09 DIAGNOSIS — N186 End stage renal disease: Secondary | ICD-10-CM | POA: Diagnosis not present

## 2016-05-09 DIAGNOSIS — Z23 Encounter for immunization: Secondary | ICD-10-CM | POA: Diagnosis not present

## 2016-05-09 DIAGNOSIS — Z992 Dependence on renal dialysis: Secondary | ICD-10-CM | POA: Diagnosis not present

## 2016-05-11 ENCOUNTER — Other Ambulatory Visit (INDEPENDENT_AMBULATORY_CARE_PROVIDER_SITE_OTHER): Payer: Self-pay

## 2016-05-11 ENCOUNTER — Encounter (INDEPENDENT_AMBULATORY_CARE_PROVIDER_SITE_OTHER): Payer: Self-pay

## 2016-05-11 ENCOUNTER — Ambulatory Visit (INDEPENDENT_AMBULATORY_CARE_PROVIDER_SITE_OTHER): Payer: Medicare Other | Admitting: Vascular Surgery

## 2016-05-11 ENCOUNTER — Encounter (INDEPENDENT_AMBULATORY_CARE_PROVIDER_SITE_OTHER): Payer: Self-pay | Admitting: Vascular Surgery

## 2016-05-11 VITALS — BP 127/73 | HR 85 | Resp 16 | Wt 115.0 lb

## 2016-05-11 DIAGNOSIS — N186 End stage renal disease: Secondary | ICD-10-CM | POA: Diagnosis not present

## 2016-05-11 DIAGNOSIS — D75829 Heparin-induced thrombocytopenia, unspecified: Secondary | ICD-10-CM

## 2016-05-11 DIAGNOSIS — T829XXS Unspecified complication of cardiac and vascular prosthetic device, implant and graft, sequela: Secondary | ICD-10-CM | POA: Diagnosis not present

## 2016-05-11 DIAGNOSIS — I10 Essential (primary) hypertension: Secondary | ICD-10-CM | POA: Diagnosis not present

## 2016-05-11 DIAGNOSIS — Z992 Dependence on renal dialysis: Principal | ICD-10-CM

## 2016-05-11 DIAGNOSIS — I713 Abdominal aortic aneurysm, ruptured, unspecified: Secondary | ICD-10-CM

## 2016-05-11 DIAGNOSIS — D7582 Heparin induced thrombocytopenia (HIT): Secondary | ICD-10-CM | POA: Diagnosis not present
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
## 2016-05-12 ENCOUNTER — Other Ambulatory Visit: Payer: Self-pay | Admitting: Family Medicine

## 2016-05-12 ENCOUNTER — Encounter
Admission: RE | Disposition: A | Discharge status: Home / Self Care | Payer: Self-pay | Source: Ambulatory Visit | Attending: Vascular Surgery

## 2016-05-12 ENCOUNTER — Ambulatory Visit
Admission: RE | Admit: 2016-05-12 | Discharge: 2016-05-12 | Disposition: A | Discharge status: Home / Self Care | Payer: Medicare Other | Source: Ambulatory Visit | Attending: Vascular Surgery | Admitting: Vascular Surgery

## 2016-05-12 DIAGNOSIS — D7582 Heparin induced thrombocytopenia (HIT): Secondary | ICD-10-CM | POA: Insufficient documentation

## 2016-05-12 DIAGNOSIS — Z87891 Personal history of nicotine dependence: Secondary | ICD-10-CM | POA: Insufficient documentation

## 2016-05-12 DIAGNOSIS — Z9981 Dependence on supplemental oxygen: Secondary | ICD-10-CM | POA: Diagnosis not present

## 2016-05-12 DIAGNOSIS — Z992 Dependence on renal dialysis: Secondary | ICD-10-CM | POA: Diagnosis not present

## 2016-05-12 DIAGNOSIS — K219 Gastro-esophageal reflux disease without esophagitis: Secondary | ICD-10-CM | POA: Diagnosis not present

## 2016-05-12 DIAGNOSIS — E538 Deficiency of other specified B group vitamins: Secondary | ICD-10-CM | POA: Diagnosis not present

## 2016-05-12 DIAGNOSIS — Z888 Allergy status to other drugs, medicaments and biological substances status: Secondary | ICD-10-CM | POA: Diagnosis not present

## 2016-05-12 DIAGNOSIS — E785 Hyperlipidemia, unspecified: Secondary | ICD-10-CM | POA: Diagnosis not present

## 2016-05-12 DIAGNOSIS — I713 Abdominal aortic aneurysm, ruptured: Secondary | ICD-10-CM | POA: Insufficient documentation

## 2016-05-12 DIAGNOSIS — Y832 Surgical operation with anastomosis, bypass or graft as the cause of abnormal reaction of the patient, or of later complication, without mention of misadventure at the time of the procedure: Secondary | ICD-10-CM | POA: Insufficient documentation

## 2016-05-12 DIAGNOSIS — I5030 Unspecified diastolic (congestive) heart failure: Secondary | ICD-10-CM | POA: Diagnosis not present

## 2016-05-12 DIAGNOSIS — D638 Anemia in other chronic diseases classified elsewhere: Secondary | ICD-10-CM | POA: Diagnosis not present

## 2016-05-12 DIAGNOSIS — Z9889 Other specified postprocedural states: Secondary | ICD-10-CM | POA: Diagnosis not present

## 2016-05-12 DIAGNOSIS — Z8249 Family history of ischemic heart disease and other diseases of the circulatory system: Secondary | ICD-10-CM | POA: Insufficient documentation

## 2016-05-12 DIAGNOSIS — E039 Hypothyroidism, unspecified: Secondary | ICD-10-CM | POA: Insufficient documentation

## 2016-05-12 DIAGNOSIS — Z886 Allergy status to analgesic agent status: Secondary | ICD-10-CM | POA: Insufficient documentation

## 2016-05-12 DIAGNOSIS — I132 Hypertensive heart and chronic kidney disease with heart failure and with stage 5 chronic kidney disease, or end stage renal disease: Secondary | ICD-10-CM | POA: Insufficient documentation

## 2016-05-12 DIAGNOSIS — Z8049 Family history of malignant neoplasm of other genital organs: Secondary | ICD-10-CM | POA: Insufficient documentation

## 2016-05-12 DIAGNOSIS — N186 End stage renal disease: Secondary | ICD-10-CM | POA: Diagnosis not present

## 2016-05-12 DIAGNOSIS — Z90710 Acquired absence of both cervix and uterus: Secondary | ICD-10-CM | POA: Insufficient documentation

## 2016-05-12 DIAGNOSIS — Z955 Presence of coronary angioplasty implant and graft: Secondary | ICD-10-CM | POA: Diagnosis not present

## 2016-05-12 DIAGNOSIS — Z823 Family history of stroke: Secondary | ICD-10-CM | POA: Diagnosis not present

## 2016-05-12 DIAGNOSIS — K5792 Diverticulitis of intestine, part unspecified, without perforation or abscess without bleeding: Secondary | ICD-10-CM | POA: Diagnosis not present

## 2016-05-12 DIAGNOSIS — Z8619 Personal history of other infectious and parasitic diseases: Secondary | ICD-10-CM | POA: Insufficient documentation

## 2016-05-12 DIAGNOSIS — J449 Chronic obstructive pulmonary disease, unspecified: Secondary | ICD-10-CM | POA: Diagnosis not present

## 2016-05-12 DIAGNOSIS — T82868A Thrombosis of vascular prosthetic devices, implants and grafts, initial encounter: Secondary | ICD-10-CM | POA: Diagnosis not present

## 2016-05-12 HISTORY — PX: PERIPHERAL VASCULAR THROMBECTOMY: CATH118306

## 2016-05-12 LAB — POTASSIUM (ARMC VASCULAR LAB ONLY): POTASSIUM (ARMC VASCULAR LAB): 4.3 (ref 3.5–5.1)

## 2016-05-12 SURGERY — PERIPHERAL VASCULAR THROMBECTOMY
Anesthesia: Moderate Sedation | Laterality: Left

## 2016-05-12 MED ORDER — SODIUM CHLORIDE FLUSH 0.9 % IV SOLN
INTRAVENOUS | Status: AC
  Filled 2016-05-12: qty 20

## 2016-05-12 MED ORDER — CEFAZOLIN IN D5W 1 GM/50ML IV SOLN
1.0000 g | Freq: Once | INTRAVENOUS | Status: AC
  Administered 2016-05-12: 1 g via INTRAVENOUS

## 2016-05-12 MED ORDER — FENTANYL CITRATE (PF) 100 MCG/2ML IJ SOLN
INTRAMUSCULAR | Status: DC | PRN
  Administered 2016-05-12: 100 ug via INTRAVENOUS
  Administered 2016-05-12: 50 ug
  Administered 2016-05-12: 50 ug via INTRAVENOUS

## 2016-05-12 MED ORDER — FENTANYL CITRATE (PF) 100 MCG/2ML IJ SOLN
INTRAMUSCULAR | Status: AC
  Filled 2016-05-12: qty 2

## 2016-05-12 MED ORDER — HEPARIN SODIUM (PORCINE) 1000 UNIT/ML IJ SOLN
INTRAMUSCULAR | Status: AC
  Filled 2016-05-12: qty 1

## 2016-05-12 MED ORDER — ARGATROBAN 50 MG/50ML IV SOLN
350.0000 ug/kg | Freq: Once | INTRAVENOUS | Status: AC
  Administered 2016-05-12: 18270 ug via INTRAVENOUS
  Filled 2016-05-12: qty 50

## 2016-05-12 MED ORDER — SODIUM CHLORIDE 0.9 % IV SOLN
INTRAVENOUS | Status: DC
  Administered 2016-05-12: 10:00:00 via INTRAVENOUS

## 2016-05-12 MED ORDER — LIDOCAINE HCL (PF) 1 % IJ SOLN
INTRAMUSCULAR | Status: AC
  Filled 2016-05-12: qty 30

## 2016-05-12 MED ORDER — MIDAZOLAM HCL 5 MG/5ML IJ SOLN
INTRAMUSCULAR | Status: AC
  Filled 2016-05-12: qty 5

## 2016-05-12 MED ORDER — IOPAMIDOL (ISOVUE-300) INJECTION 61%
INTRAVENOUS | Status: DC | PRN
  Administered 2016-05-12: 70 mL via INTRA_ARTERIAL

## 2016-05-12 MED ORDER — MIDAZOLAM HCL 2 MG/2ML IJ SOLN
INTRAMUSCULAR | Status: DC | PRN
  Administered 2016-05-12: 2 mg via INTRAVENOUS
  Administered 2016-05-12 (×2): 1 mg
  Administered 2016-05-12: 1 mg via INTRAVENOUS

## 2016-05-12 SURGICAL SUPPLY — 21 items
BALLN CONQUEST 8X60X75 (BALLOONS) ×3
BALLN ULTRVRSE 5X20X75C (BALLOONS) ×3
BALLOON CONQUEST 8X60X75 (BALLOONS) IMPLANT
BALLOON ULTRVRSE 5X20X75C (BALLOONS) IMPLANT
CATH BEACON 5.038 65CM KMP-01 (CATHETERS) ×2 IMPLANT
DEVICE PRESTO INFLATION (MISCELLANEOUS) ×2 IMPLANT
DEVICE TORQUE (MISCELLANEOUS) ×2 IMPLANT
GRAFT STENT FLAIR ENDOVAS 8X50 (Permanent Stent) ×2 IMPLANT
GRAFT STENT STR ENDOVAS 7X50 (Permanent Stent) ×2 IMPLANT
GUIDEWIRE ANGLED .035 180CM (WIRE) ×2 IMPLANT
KIT THROMB PERC PTD (MISCELLANEOUS) ×2 IMPLANT
NDL ENTRY 21GA 7CM ECHOTIP (NEEDLE) IMPLANT
NEEDLE ENTRY 21GA 7CM ECHOTIP (NEEDLE) ×3 IMPLANT
PACK ANGIOGRAPHY (CUSTOM PROCEDURE TRAY) ×3 IMPLANT
SET INTRO CAPELLA COAXIAL (SET/KITS/TRAYS/PACK) ×2 IMPLANT
SHEATH BRITE TIP 6FRX5.5 (SHEATH) ×4 IMPLANT
SHEATH BRITE TIP 7FRX11 (SHEATH) ×4 IMPLANT
SYR MEDRAD MARK V 150ML (SYRINGE) ×3 IMPLANT
TUBING CONTRAST HIGH PRESS 72 (TUBING) ×3 IMPLANT
WIRE J 3MM .035X145CM (WIRE) ×3 IMPLANT
WIRE MAGIC TOR.035 180C (WIRE) ×2 IMPLANT

## 2016-05-12 NOTE — Op Note (Signed)
OPERATIVE NOTE   PROCEDURE: 1. Contrast injection left brachial axillary dialysis graft 2. Mechanical thrombectomy left brachial axillary dialysis graft 3. Percutaneous transluminal angioplasty and stent placement left AV graft at the venous outflow. 4. Percutaneous transluminal and plasty and stent placement left AV graft midportion of graft  PRE-OPERATIVE DIAGNOSIS: Complication of dialysis access                                                       End Stage Renal Disease  POST-OPERATIVE DIAGNOSIS: same as above   SURGEON: Katha Cabal, M.D.  ANESTHESIA: Conscious Sedation   ESTIMATED BLOOD LOSS: minimal  FINDING(S): 1. Thrombus within the graft stenoses noted both in the venous outflow as well as in the midportion of the graft  SPECIMEN(S):  None  CONTRAST: 50 cc  FLUOROSCOPY TIME: Approximately 8 minutes  INDICATIONS: [name] is a 66 y.o. female who  presents with malfunctioning left arm brachial axillary AV access.  The patient is scheduled for angiography with possible intervention of the AV access.  The patient is aware the risks include but are not limited to: bleeding, infection, thrombosis of the cannulated access, and possible anaphylactic reaction to the contrast.  The patient acknowledges if the access can not be salvaged a tunneled catheter will be needed and will be placed during this procedure.  The patient is aware of the risks of the procedure and elects to proceed with the angiogram and intervention.  DESCRIPTION: After full informed written consent was obtained, the patient was brought back to the Special Procedure suite and placed supine position.  Appropriate cardiopulmonary monitors were placed.  The left arm was prepped and draped in the standard fashion.  Appropriate timeout is called. The left brachial axillary graft  was cannulated with a micropuncture needle.  The microwire was advanced and the needle was exchanged for  a microsheath.   The J-wire was then advanced and a 6 Fr sheath inserted.  Hand was then performed which demonstrated thrombus within the AV access.  The central venous structures were also imaged by hand injections.  Cardiac bolus was given and allowed to circulate as well.  Trerotola device was then advanced beginning centrally and pulling back performing.  Several passes were made through the venous portion of the graft. Follow-up imaging now demonstrates the vast majority of the clot had been treated. Therefore a retrograde sheath was inserted. This too was a 6 [place] sheath was positioned more proximally on the arm and angled in the retrograde direction. Subsequently a floppy Glidewire and a KMP catheter were negotiated into the arterial system hand injection contrast was then utilized to demonstrate patency of the artery as well as the location for the anastomosis.  Trerotola device was now advanced through the retrograde sheath was extended out into the artery the basket was opened and it was slowly pulled back into the graft and then the basket was engaged. Several passes were made on the arterial portion and pulsatility of the access was reestablished. Follow-up imaging demonstrates there was now thrombus in the venous portion surrounding the sheath and this was treated with the Trerotola device from the antegrade direction. After several passes imaging demonstrated resolution of thrombus within the graft and forward flow however stricture of the graft was noted area  Magic torque wire was then  advanced through the antegrade sheath and an 8 x 6 [employer] balloon was used to angioplasty the venous portion of the AV access. Multiple inflations were performed inflation times with 30 seconds to 1 minute with maximum pressures of 20 ATM. This did not improve the venous outflow stenosis and an 8 x 50 flared flair stent was deployed across the venous outflow and postdilated with an 8 mm balloon. Follow-up imaging demonstrated  excellent result with 0 residual stenosis  With the balloon inflated reflux of contrast was performed demonstrating the arterial anastomosis. The arterial anastomosis was noted to have a stenosis in the previously placed stent as well as at the origin of the graft. Therefore a 5 x 2 ultra versed balloon was used and plasty the arterial anastomosis essentially at the origin inflation was to 14 atm for 1 minute. Subsequently a 7 x 50 straight fluency stent was deployed across the previously placed stent in the arterial portion of the graft and this was postdilated with the 8 mm balloon. Follow-up imaging now demonstrated wide patency of the graft. Contrast was then injected in the forward direction demonstrating rapid flow with wide patency of the central venous anatomy.  A 4-0 Monocryl purse-string suture was sewn around both of the sheaths.  The sheaths were removed and light pressure was applied.  A sterile bandage was applied to the puncture site.    COMPLICATIONS: None  CONDITION: None  Katha Cabal, M.D [facility] Office: [phone]  05/12/2016 6:01 PM

## 2016-05-12 NOTE — Progress Notes (Signed)
ANTICOAGULATION CONSULT NOTE - Initial Consult  Pharmacy Consult for Argatroban Indication: thrombolysis of dialysis fistula  Allergies  Allergen Reactions  . Asa [Aspirin] Other (See Comments)    unknown  . 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 Plavix [Clopidogrel] Other (See Comments)    unknown    Patient Measurements: Height: 5\' 4"  (162.6 cm) Weight: 115 lb (52.2 kg) IBW/kg (Calculated) : 54.7 Heparin Dosing Weight:    Vital Signs: Temp: 98 F (36.7 C) (02/13 1002) Temp Source: Oral (02/13 1002) BP: 139/61 (02/13 1002) Pulse Rate: 78 (02/13 1002)  Labs: No results for input(s): HGB, HCT, PLT, APTT, LABPROT, INR, HEPARINUNFRC, HEPRLOWMOCWT, CREATININE, CKTOTAL, CKMB, TROPONINI in the last 72 hours.  CrCl cannot be calculated (Patient's most recent lab result is older than the maximum 21 days allowed.).   Medical History: Past Medical History:  Diagnosis Date  . Anemia   . Aneurysm ([place])    abdominal aortic  . Anxiety   . B12 deficiency   . Cancer ([place])    skin on left ankle 04/10/15 pt states she has never had cancer  . CHF (congestive heart failure) ([place])   . COPD (chronic obstructive pulmonary disease) ([place])   . Dialysis patient ([place])    Tues, Thurs, Sat  . Diastolic heart failure ([place])   . Diverticulosis   . ESRD (end stage renal disease) ([place])   . GERD (gastroesophageal reflux disease)   . Granuloma annulare 2010   skin- sees dermatologist  . Heart murmur   . Hyperlipidemia   . Hypertension   . Hypothyroidism   . On home oxygen therapy   . Renal insufficiency   . STD (sexually transmitted disease)    chlamydia  . Thyroid disease   . VAIN II (vaginal intraepithelial neoplasia grade II) 7/09    Medications:  Scheduled:  . argatroban  350 mcg/kg Intravenous Once  .  ceFAZolin (ANCEF) IV  1 g Intravenous Once    Assessment: Pharmacy consulted to dose argatroban in this 66 year old female with history of HIT.  Pt has an HD  catheter which has become clotted.  Per Dr Delana Meyer, this is similar to PCI but without continuous infusion.   Goal of Therapy:  thrombolysis of clotted HD fistula   Plan:  Will bolus this pt Argatroban 350 mcg/kg (18,270 mcg) with instructions to run over 3 - 5 minutes.   Ceira Hoeschen D 05/12/2016,2:27 PM

## 2016-05-12 NOTE — Discharge Instructions (Signed)
Fistulogram, Care After °Introduction °Refer to this sheet in the next few weeks. These instructions provide you with information on caring for yourself after your procedure. Your health care provider may also give you more specific instructions. Your treatment has been planned according to current medical practices, but problems sometimes occur. Call your health care provider if you have any problems or questions after your procedure. °What can I expect after the procedure? °After your procedure, it is typical to have the following: °· A small amount of discomfort in the area where the catheters were placed. °· A small amount of bruising around the fistula. °· Sleepiness and fatigue. °Follow these instructions at home: °· Rest at home for the day following your procedure. °· Do not drive or operate heavy machinery while taking pain medicine. °· Take medicines only as directed by your health care provider. °· Do not take baths, swim, or use a hot tub until your health care provider approves. You may shower 24 hours after the procedure or as directed by your health care provider. °· There are many different ways to close and cover an incision, including stitches, skin glue, and adhesive strips. Follow your health care provider's instructions on: °¨ Incision care. °¨ Bandage (dressing) changes and removal. °¨ Incision closure removal. °· Monitor your dialysis fistula carefully. °Contact a health care provider if: °· You have drainage, redness, swelling, or pain at your catheter site. °· You have a fever. °· You have chills. °Get help right away if: °· You feel weak. °· You have trouble balancing. °· You have trouble moving your arms or legs. °· You have problems with your speech or vision. °· You can no longer feel a vibration or buzz when you put your fingers over your dialysis fistula. °· The limb that was used for the procedure: °¨ Swells. °¨ Is painful. °¨ Is cold. °¨ Is discolored, such as blue or pale  white. °This information is not intended to replace advice given to you by your health care provider. Make sure you discuss any questions you have with your health care provider. °Document Released: 07/31/2013 Document Revised: 08/22/2015 Document Reviewed: 05/05/2013 °© 2017 Elsevier ° °

## 2016-05-12 NOTE — H&P (Signed)
[facility] History & Physical Update  The patient was interviewed and re-examined.  The patient's previous History and Physical has been reviewed and is unchanged.  There is no change in the plan of care. We plan to proceed with the scheduled procedure.  Hortencia Pilar, MD  05/12/2016, 10:07 AM

## 2016-05-13 DIAGNOSIS — Z992 Dependence on renal dialysis: Secondary | ICD-10-CM | POA: Diagnosis not present

## 2016-05-13 DIAGNOSIS — Z23 Encounter for immunization: Secondary | ICD-10-CM | POA: Diagnosis not present

## 2016-05-13 DIAGNOSIS — N186 End stage renal disease: Secondary | ICD-10-CM | POA: Diagnosis not present

## 2016-05-13 IMAGING — DX DG CHEST 1V PORT
1 series · 1 of 1 positions shown · non-contrast
Comparison: Radiograph dated 04/17/2015

CLINICAL DATA: 64-year-old female with respiratory distress and
lower extremity swelling.

EXAM:
PORTABLE CHEST 1 VIEW

[chest ap]
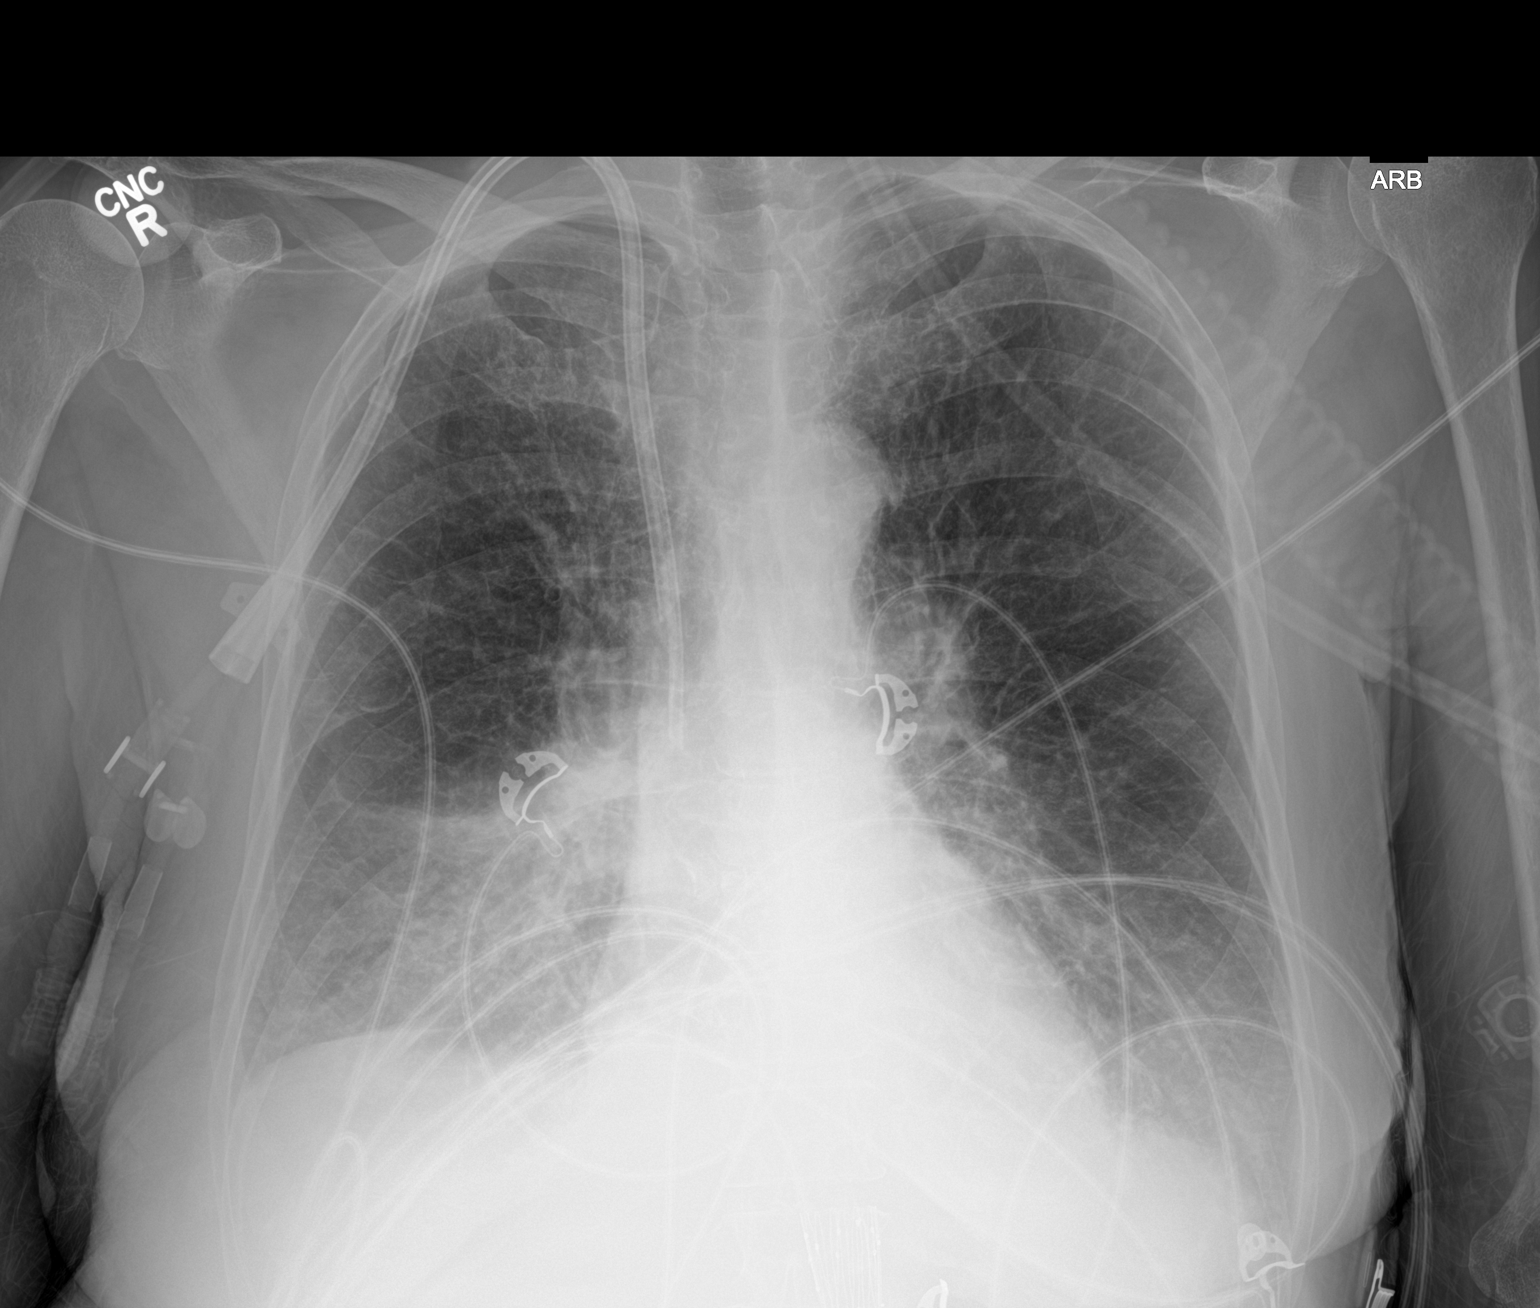

[1 of 1 positions shown; findings below may reference images not displayed]

FINDINGS: A dual lumen dialysis catheter is noted in stable positioning. There
has been interval removal of the left IJ central line. Single
portable view of the chest demonstrates emphysematous changes lungs.
There are bilateral hazy densities, right greater left. There has
been interval improvement of the interstitial prominence, likely
interval improvement in pulmonary edema. Small left pleural effusion
may be present. The cardiac silhouette is within normal limits. No
acute osseous pathology identified.
IMPRESSION: Interval removal of the left IJ central venous line.

Interval improvement of the previously mild pulmonary edema.

Bibasilar hazy airspace densities, right appearing left.

## 2016-05-14 ENCOUNTER — Encounter: Payer: Self-pay | Admitting: Vascular Surgery

## 2016-05-14 DIAGNOSIS — N186 End stage renal disease: Secondary | ICD-10-CM | POA: Diagnosis not present

## 2016-05-14 DIAGNOSIS — Z992 Dependence on renal dialysis: Secondary | ICD-10-CM | POA: Diagnosis not present

## 2016-05-14 DIAGNOSIS — Z23 Encounter for immunization: Secondary | ICD-10-CM | POA: Diagnosis not present

## 2016-05-16 DIAGNOSIS — Z23 Encounter for immunization: Secondary | ICD-10-CM | POA: Diagnosis not present

## 2016-05-16 DIAGNOSIS — N186 End stage renal disease: Secondary | ICD-10-CM | POA: Diagnosis not present

## 2016-05-16 DIAGNOSIS — Z992 Dependence on renal dialysis: Secondary | ICD-10-CM | POA: Diagnosis not present

## 2016-05-19 DIAGNOSIS — Z23 Encounter for immunization: Secondary | ICD-10-CM | POA: Diagnosis not present

## 2016-05-19 DIAGNOSIS — Z992 Dependence on renal dialysis: Secondary | ICD-10-CM | POA: Diagnosis not present

## 2016-05-19 DIAGNOSIS — N186 End stage renal disease: Secondary | ICD-10-CM | POA: Diagnosis not present

## 2016-05-21 DIAGNOSIS — Z23 Encounter for immunization: Secondary | ICD-10-CM | POA: Diagnosis not present

## 2016-05-21 DIAGNOSIS — Z992 Dependence on renal dialysis: Secondary | ICD-10-CM | POA: Diagnosis not present

## 2016-05-21 DIAGNOSIS — N186 End stage renal disease: Secondary | ICD-10-CM | POA: Diagnosis not present

## 2016-05-23 DIAGNOSIS — N186 End stage renal disease: Secondary | ICD-10-CM | POA: Diagnosis not present

## 2016-05-23 DIAGNOSIS — Z23 Encounter for immunization: Secondary | ICD-10-CM | POA: Diagnosis not present

## 2016-05-23 DIAGNOSIS — Z992 Dependence on renal dialysis: Secondary | ICD-10-CM | POA: Diagnosis not present

## 2016-05-26 ENCOUNTER — Other Ambulatory Visit: Payer: Self-pay | Admitting: Internal Medicine

## 2016-05-26 ENCOUNTER — Encounter (INDEPENDENT_AMBULATORY_CARE_PROVIDER_SITE_OTHER): Payer: Self-pay

## 2016-05-26 DIAGNOSIS — Z992 Dependence on renal dialysis: Secondary | ICD-10-CM | POA: Diagnosis not present

## 2016-05-26 DIAGNOSIS — Z23 Encounter for immunization: Secondary | ICD-10-CM | POA: Diagnosis not present

## 2016-05-26 DIAGNOSIS — N186 End stage renal disease: Secondary | ICD-10-CM | POA: Diagnosis not present

## 2016-05-27 DIAGNOSIS — Z992 Dependence on renal dialysis: Secondary | ICD-10-CM | POA: Diagnosis not present

## 2016-05-27 DIAGNOSIS — N186 End stage renal disease: Secondary | ICD-10-CM | POA: Diagnosis not present

## 2016-05-28 DIAGNOSIS — Z992 Dependence on renal dialysis: Secondary | ICD-10-CM | POA: Diagnosis not present

## 2016-05-28 DIAGNOSIS — D631 Anemia in chronic kidney disease: Secondary | ICD-10-CM | POA: Diagnosis not present

## 2016-05-28 DIAGNOSIS — N186 End stage renal disease: Secondary | ICD-10-CM | POA: Diagnosis not present

## 2016-05-28 DIAGNOSIS — D509 Iron deficiency anemia, unspecified: Secondary | ICD-10-CM | POA: Diagnosis not present

## 2016-05-30 DIAGNOSIS — Z992 Dependence on renal dialysis: Secondary | ICD-10-CM | POA: Diagnosis not present

## 2016-05-30 DIAGNOSIS — D509 Iron deficiency anemia, unspecified: Secondary | ICD-10-CM | POA: Diagnosis not present

## 2016-05-30 DIAGNOSIS — D631 Anemia in chronic kidney disease: Secondary | ICD-10-CM | POA: Diagnosis not present

## 2016-05-30 DIAGNOSIS — N186 End stage renal disease: Secondary | ICD-10-CM | POA: Diagnosis not present

## 2016-06-01 ENCOUNTER — Other Ambulatory Visit (INDEPENDENT_AMBULATORY_CARE_PROVIDER_SITE_OTHER): Payer: Self-pay | Admitting: Vascular Surgery

## 2016-06-02 ENCOUNTER — Ambulatory Visit
Admission: RE | Admit: 2016-06-02 | Discharge: 2016-06-02 | Disposition: A | Discharge status: Home / Self Care | Payer: Medicare Other | Source: Ambulatory Visit | Attending: Vascular Surgery | Admitting: Vascular Surgery

## 2016-06-02 ENCOUNTER — Encounter
Admission: RE | Disposition: A | Discharge status: Home / Self Care | Payer: Self-pay | Source: Ambulatory Visit | Attending: Vascular Surgery

## 2016-06-02 DIAGNOSIS — Z90710 Acquired absence of both cervix and uterus: Secondary | ICD-10-CM | POA: Diagnosis not present

## 2016-06-02 DIAGNOSIS — E039 Hypothyroidism, unspecified: Secondary | ICD-10-CM | POA: Diagnosis not present

## 2016-06-02 DIAGNOSIS — K219 Gastro-esophageal reflux disease without esophagitis: Secondary | ICD-10-CM | POA: Diagnosis not present

## 2016-06-02 DIAGNOSIS — Z87891 Personal history of nicotine dependence: Secondary | ICD-10-CM | POA: Diagnosis not present

## 2016-06-02 DIAGNOSIS — K5792 Diverticulitis of intestine, part unspecified, without perforation or abscess without bleeding: Secondary | ICD-10-CM | POA: Insufficient documentation

## 2016-06-02 DIAGNOSIS — Z85820 Personal history of malignant melanoma of skin: Secondary | ICD-10-CM | POA: Diagnosis not present

## 2016-06-02 DIAGNOSIS — Z951 Presence of aortocoronary bypass graft: Secondary | ICD-10-CM | POA: Diagnosis not present

## 2016-06-02 DIAGNOSIS — Z809 Family history of malignant neoplasm, unspecified: Secondary | ICD-10-CM | POA: Insufficient documentation

## 2016-06-02 DIAGNOSIS — I132 Hypertensive heart and chronic kidney disease with heart failure and with stage 5 chronic kidney disease, or end stage renal disease: Secondary | ICD-10-CM | POA: Insufficient documentation

## 2016-06-02 DIAGNOSIS — I5032 Chronic diastolic (congestive) heart failure: Secondary | ICD-10-CM | POA: Diagnosis not present

## 2016-06-02 DIAGNOSIS — Z8619 Personal history of other infectious and parasitic diseases: Secondary | ICD-10-CM | POA: Insufficient documentation

## 2016-06-02 DIAGNOSIS — R011 Cardiac murmur, unspecified: Secondary | ICD-10-CM | POA: Diagnosis not present

## 2016-06-02 DIAGNOSIS — N289 Disorder of kidney and ureter, unspecified: Secondary | ICD-10-CM | POA: Diagnosis not present

## 2016-06-02 DIAGNOSIS — Z823 Family history of stroke: Secondary | ICD-10-CM | POA: Insufficient documentation

## 2016-06-02 DIAGNOSIS — Z888 Allergy status to other drugs, medicaments and biological substances status: Secondary | ICD-10-CM | POA: Insufficient documentation

## 2016-06-02 DIAGNOSIS — E538 Deficiency of other specified B group vitamins: Secondary | ICD-10-CM | POA: Insufficient documentation

## 2016-06-02 DIAGNOSIS — N891 Moderate vaginal dysplasia: Secondary | ICD-10-CM | POA: Insufficient documentation

## 2016-06-02 DIAGNOSIS — Z992 Dependence on renal dialysis: Secondary | ICD-10-CM | POA: Insufficient documentation

## 2016-06-02 DIAGNOSIS — N186 End stage renal disease: Secondary | ICD-10-CM | POA: Insufficient documentation

## 2016-06-02 DIAGNOSIS — I714 Abdominal aortic aneurysm, without rupture: Secondary | ICD-10-CM | POA: Diagnosis not present

## 2016-06-02 DIAGNOSIS — Z452 Encounter for adjustment and management of vascular access device: Secondary | ICD-10-CM | POA: Insufficient documentation

## 2016-06-02 DIAGNOSIS — Z886 Allergy status to analgesic agent status: Secondary | ICD-10-CM | POA: Insufficient documentation

## 2016-06-02 DIAGNOSIS — J449 Chronic obstructive pulmonary disease, unspecified: Secondary | ICD-10-CM | POA: Insufficient documentation

## 2016-06-02 DIAGNOSIS — Z9981 Dependence on supplemental oxygen: Secondary | ICD-10-CM | POA: Insufficient documentation

## 2016-06-02 DIAGNOSIS — D631 Anemia in chronic kidney disease: Secondary | ICD-10-CM | POA: Diagnosis not present

## 2016-06-02 DIAGNOSIS — D509 Iron deficiency anemia, unspecified: Secondary | ICD-10-CM | POA: Diagnosis not present

## 2016-06-02 DIAGNOSIS — Z8249 Family history of ischemic heart disease and other diseases of the circulatory system: Secondary | ICD-10-CM | POA: Insufficient documentation

## 2016-06-02 HISTORY — PX: DIALYSIS/PERMA CATHETER REMOVAL: CATH118289

## 2016-06-02 SURGERY — DIALYSIS/PERMA CATHETER REMOVAL
Anesthesia: Moderate Sedation

## 2016-06-02 MED ORDER — LIDOCAINE-EPINEPHRINE (PF) 2 %-1:200000 IJ SOLN
INTRAMUSCULAR | Status: AC
  Filled 2016-06-02: qty 20

## 2016-06-02 SURGICAL SUPPLY — 2 items
TRAY LACERAT/PLASTIC (MISCELLANEOUS) ×2 IMPLANT
TRAY SUT REMOVAL LITTAUER SCS (KITS) ×2 IMPLANT

## 2016-06-02 NOTE — Discharge Instructions (Signed)
Incision Care, Adult °An incision is a cut that a doctor makes in your skin for surgery (for a procedure). Most times, these cuts are closed after surgery. Your cut from surgery may be closed with stitches (sutures), staples, skin glue, or skin tape (adhesive strips). You may need to return to your doctor to have stitches or staples taken out. This may happen many days or many weeks after your surgery. The cut needs to be well cared for so it does not get infected. °How to care for your cut °Cut care °· Follow instructions from your doctor about how to take care of your cut. Make sure you: °? Wash your hands with soap and water before you change your bandage (dressing). If you cannot use soap and water, use hand sanitizer. °? Change your bandage as told by your doctor. °? Leave stitches, skin glue, or skin tape in place. They may need to stay in place for 2 weeks or longer. If tape strips get loose and curl up, you may trim the loose edges. Do not remove tape strips completely unless your doctor says it is okay. °· Check your cut area every day for signs of infection. Check for: °? More redness, swelling, or pain. °? More fluid or blood. °? Warmth. °? Pus or a bad smell. °· Ask your doctor how to clean the cut. This may include: °? Using mild soap and water. °? Using a clean towel to pat the cut dry after you clean it. °? Putting a cream or ointment on the cut. Do this only as told by your doctor. °? Covering the cut with a clean bandage. °· Ask your doctor when you can leave the cut uncovered. °· Do not take baths, swim, or use a hot tub until your doctor says it is okay. Ask your doctor if you can take showers. You may only be allowed to take sponge baths for bathing. °Medicines °· If you were prescribed an antibiotic medicine, cream, or ointment, take the antibiotic or put it on the cut as told by your doctor. Do not stop taking or putting on the antibiotic even if your condition gets better. °· Take  over-the-counter and prescription medicines only as told by your doctor. °General instructions °· Limit movement around your cut. This helps healing. °? Avoid straining, lifting, or exercise for the first month, or for as long as told by your doctor. °? Follow instructions from your doctor about going back to your normal activities. °? Ask your doctor what activities are safe. °· Protect your cut from the sun when you are outside for the first 6 months, or for as long as told by your doctor. Put on sunscreen around the scar or cover up the scar. °· Keep all follow-up visits as told by your doctor. This is important. °Contact a doctor if: °· Your have more redness, swelling, or pain around the cut. °· You have more fluid or blood coming from the cut. °· Your cut feels warm to the touch. °· You have pus or a bad smell coming from the cut. °· You have a fever or shaking chills. °· You feel sick to your stomach (nauseous) or you throw up (vomit). °· You are dizzy. °· Your stitches or staples come undone. °Get help right away if: °· You have a red streak coming from your cut. °· Your cut bleeds through the bandage and the bleeding does not stop with gentle pressure. °· The edges of your cut open   up and separate. °· You have very bad (severe) pain. °· You have a rash. °· You are confused. °· You pass out (faint). °· You have trouble breathing and you have a fast heartbeat. °This information is not intended to replace advice given to you by your health care provider. Make sure you discuss any questions you have with your health care provider. °Document Released: 06/08/2011 Document Revised: 11/22/2015 Document Reviewed: 11/22/2015 °Elsevier Interactive Patient Education © 2017 [employer] ° °

## 2016-06-02 NOTE — Op Note (Signed)
  OPERATIVE NOTE   PROCEDURE: 1. Removal of a right IJ tunneled dialysis catheter  PRE-OPERATIVE DIAGNOSIS: Complication of dialysis catheter  POST-OPERATIVE DIAGNOSIS: Same  SURGEON: Hortencia Pilar  ANESTHESIA: Local anesthetic with 1% lidocaine with epinephrine   ESTIMATED BLOOD LOSS: Minimal   FINDING(S): 1. Catheter intact   SPECIMEN(S):  Catheter  INDICATIONS:   [name] is a 66 y.o. female who presents with functioning arm access. Therefore, her tunneled catheter is no longer needed and it is being removed. The risks and benefits of been reviewed all questions answered patient has agreed to proceed with catheter removal.  DESCRIPTION: After obtaining full informed written consent, the patient was positioned supine. The right IJ catheter and surrounding area is prepped and draped in a sterile fashion. The cuff was localized by palpation and noted to be greater than 3 cm from the exit site. After appropriate timeout is called, 1% lidocaine with epinephrine is infiltrated into the surrounding tissues around the cuff. Small transverse incision is created with an 11 blade scalpel and the dissection was carried down to expose the cuff of the tunneled catheter.  The catheter is then freed from the surrounding attachments and adhesions. Once the catheter has been freed circumferentially it is transected just distal to the cuff and subsequently removed in 2 pieces. Light pressure was held at the base of the neck. A 4-0 Monocryl was used close the tunnel in the subcutaneous space. The 4-0 Monocryl Monocryl was then used to close the skin in a subcuticular stitch. Dermabond is applied.  Antibiotic ointment and a sterile dressing is applied to the exit site. Patient tolerated procedure well and there were no complications.  COMPLICATIONS: None  CONDITION: Unchanged  Hortencia Pilar. [facility] Office: [phone]  06/02/2016,6:26 PM

## 2016-06-02 NOTE — H&P (Signed)
[facility] History & Physical Update  The patient was interviewed and re-examined.  The patient's previous History and Physical has been reviewed and is unchanged.  There is no change in the plan of care. We plan to proceed with the scheduled procedure.  Hortencia Pilar, MD  06/02/2016, 1:34 PM

## 2016-06-03 ENCOUNTER — Encounter: Payer: Self-pay | Admitting: Vascular Surgery

## 2016-06-04 DIAGNOSIS — Z992 Dependence on renal dialysis: Secondary | ICD-10-CM | POA: Diagnosis not present

## 2016-06-04 DIAGNOSIS — D509 Iron deficiency anemia, unspecified: Secondary | ICD-10-CM | POA: Diagnosis not present

## 2016-06-04 DIAGNOSIS — D631 Anemia in chronic kidney disease: Secondary | ICD-10-CM | POA: Diagnosis not present

## 2016-06-04 DIAGNOSIS — N186 End stage renal disease: Secondary | ICD-10-CM | POA: Diagnosis not present

## 2016-06-06 DIAGNOSIS — Z992 Dependence on renal dialysis: Secondary | ICD-10-CM | POA: Diagnosis not present

## 2016-06-06 DIAGNOSIS — D509 Iron deficiency anemia, unspecified: Secondary | ICD-10-CM | POA: Diagnosis not present

## 2016-06-06 DIAGNOSIS — N186 End stage renal disease: Secondary | ICD-10-CM | POA: Diagnosis not present

## 2016-06-06 DIAGNOSIS — D631 Anemia in chronic kidney disease: Secondary | ICD-10-CM | POA: Diagnosis not present

## 2016-06-08 ENCOUNTER — Encounter (INDEPENDENT_AMBULATORY_CARE_PROVIDER_SITE_OTHER): Payer: Self-pay | Admitting: Vascular Surgery

## 2016-06-08 ENCOUNTER — Ambulatory Visit (INDEPENDENT_AMBULATORY_CARE_PROVIDER_SITE_OTHER): Payer: Medicare Other | Admitting: Vascular Surgery

## 2016-06-08 VITALS — BP 127/69 | HR 81 | Resp 16 | Wt 118.0 lb

## 2016-06-08 DIAGNOSIS — N186 End stage renal disease: Secondary | ICD-10-CM

## 2016-06-08 DIAGNOSIS — I10 Essential (primary) hypertension: Secondary | ICD-10-CM | POA: Diagnosis not present

## 2016-06-08 DIAGNOSIS — J438 Other emphysema: Secondary | ICD-10-CM | POA: Diagnosis not present

## 2016-06-08 DIAGNOSIS — T829XXS Unspecified complication of cardiac and vascular prosthetic device, implant and graft, sequela: Secondary | ICD-10-CM | POA: Diagnosis not present

## 2016-06-08 DIAGNOSIS — I713 Abdominal aortic aneurysm, ruptured, unspecified: Secondary | ICD-10-CM

## 2016-06-08 DIAGNOSIS — Z992 Dependence on renal dialysis: Secondary | ICD-10-CM

## 2016-06-08 DIAGNOSIS — D7582 Heparin induced thrombocytopenia (HIT): Secondary | ICD-10-CM

## 2016-06-08 DIAGNOSIS — D75829 Heparin-induced thrombocytopenia, unspecified: Secondary | ICD-10-CM
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
## 2016-06-09 DIAGNOSIS — N186 End stage renal disease: Secondary | ICD-10-CM | POA: Diagnosis not present

## 2016-06-09 DIAGNOSIS — H18832 Recurrent erosion of cornea, left eye: Secondary | ICD-10-CM | POA: Diagnosis not present

## 2016-06-09 DIAGNOSIS — D631 Anemia in chronic kidney disease: Secondary | ICD-10-CM | POA: Diagnosis not present

## 2016-06-09 DIAGNOSIS — Z992 Dependence on renal dialysis: Secondary | ICD-10-CM | POA: Diagnosis not present

## 2016-06-09 DIAGNOSIS — D509 Iron deficiency anemia, unspecified: Secondary | ICD-10-CM | POA: Diagnosis not present

## 2016-06-10 DIAGNOSIS — B0052 Herpesviral keratitis: Secondary | ICD-10-CM | POA: Diagnosis not present

## 2016-06-11 ENCOUNTER — Other Ambulatory Visit: Payer: Self-pay | Admitting: Family Medicine

## 2016-06-11 DIAGNOSIS — Z992 Dependence on renal dialysis: Secondary | ICD-10-CM | POA: Diagnosis not present

## 2016-06-11 DIAGNOSIS — N186 End stage renal disease: Secondary | ICD-10-CM | POA: Diagnosis not present

## 2016-06-11 DIAGNOSIS — D631 Anemia in chronic kidney disease: Secondary | ICD-10-CM | POA: Diagnosis not present

## 2016-06-11 DIAGNOSIS — D509 Iron deficiency anemia, unspecified: Secondary | ICD-10-CM | POA: Diagnosis not present

## 2016-06-12 DIAGNOSIS — B0052 Herpesviral keratitis: Secondary | ICD-10-CM | POA: Diagnosis not present

## 2016-06-13 DIAGNOSIS — Z992 Dependence on renal dialysis: Secondary | ICD-10-CM | POA: Diagnosis not present

## 2016-06-13 DIAGNOSIS — N186 End stage renal disease: Secondary | ICD-10-CM | POA: Diagnosis not present

## 2016-06-13 DIAGNOSIS — D509 Iron deficiency anemia, unspecified: Secondary | ICD-10-CM | POA: Diagnosis not present

## 2016-06-13 DIAGNOSIS — D631 Anemia in chronic kidney disease: Secondary | ICD-10-CM | POA: Diagnosis not present

## 2016-06-15 DIAGNOSIS — H16142 Punctate keratitis, left eye: Secondary | ICD-10-CM | POA: Diagnosis not present

## 2016-06-16 DIAGNOSIS — N186 End stage renal disease: Secondary | ICD-10-CM | POA: Diagnosis not present

## 2016-06-16 DIAGNOSIS — Z992 Dependence on renal dialysis: Secondary | ICD-10-CM | POA: Diagnosis not present

## 2016-06-16 DIAGNOSIS — D509 Iron deficiency anemia, unspecified: Secondary | ICD-10-CM | POA: Diagnosis not present

## 2016-06-16 DIAGNOSIS — D631 Anemia in chronic kidney disease: Secondary | ICD-10-CM | POA: Diagnosis not present

## 2016-06-17 DIAGNOSIS — H16142 Punctate keratitis, left eye: Secondary | ICD-10-CM | POA: Diagnosis not present

## 2016-06-18 DIAGNOSIS — D509 Iron deficiency anemia, unspecified: Secondary | ICD-10-CM | POA: Diagnosis not present

## 2016-06-18 DIAGNOSIS — Z992 Dependence on renal dialysis: Secondary | ICD-10-CM | POA: Diagnosis not present

## 2016-06-18 DIAGNOSIS — D631 Anemia in chronic kidney disease: Secondary | ICD-10-CM | POA: Diagnosis not present

## 2016-06-18 DIAGNOSIS — N186 End stage renal disease: Secondary | ICD-10-CM | POA: Diagnosis not present

## 2016-06-20 DIAGNOSIS — Z992 Dependence on renal dialysis: Secondary | ICD-10-CM | POA: Diagnosis not present

## 2016-06-20 DIAGNOSIS — D631 Anemia in chronic kidney disease: Secondary | ICD-10-CM | POA: Diagnosis not present

## 2016-06-20 DIAGNOSIS — D509 Iron deficiency anemia, unspecified: Secondary | ICD-10-CM | POA: Diagnosis not present

## 2016-06-20 DIAGNOSIS — N186 End stage renal disease: Secondary | ICD-10-CM | POA: Diagnosis not present

## 2016-06-23 DIAGNOSIS — Z992 Dependence on renal dialysis: Secondary | ICD-10-CM | POA: Diagnosis not present

## 2016-06-23 DIAGNOSIS — D631 Anemia in chronic kidney disease: Secondary | ICD-10-CM | POA: Diagnosis not present

## 2016-06-23 DIAGNOSIS — D509 Iron deficiency anemia, unspecified: Secondary | ICD-10-CM | POA: Diagnosis not present

## 2016-06-23 DIAGNOSIS — N186 End stage renal disease: Secondary | ICD-10-CM | POA: Diagnosis not present

## 2016-06-25 DIAGNOSIS — N186 End stage renal disease: Secondary | ICD-10-CM | POA: Diagnosis not present

## 2016-06-25 DIAGNOSIS — Z992 Dependence on renal dialysis: Secondary | ICD-10-CM | POA: Diagnosis not present

## 2016-06-25 DIAGNOSIS — D631 Anemia in chronic kidney disease: Secondary | ICD-10-CM | POA: Diagnosis not present

## 2016-06-25 DIAGNOSIS — D509 Iron deficiency anemia, unspecified: Secondary | ICD-10-CM | POA: Diagnosis not present

## 2016-06-27 DIAGNOSIS — D631 Anemia in chronic kidney disease: Secondary | ICD-10-CM | POA: Diagnosis not present

## 2016-06-27 DIAGNOSIS — N186 End stage renal disease: Secondary | ICD-10-CM | POA: Diagnosis not present

## 2016-06-27 DIAGNOSIS — D509 Iron deficiency anemia, unspecified: Secondary | ICD-10-CM | POA: Diagnosis not present

## 2016-06-27 DIAGNOSIS — Z992 Dependence on renal dialysis: Secondary | ICD-10-CM | POA: Diagnosis not present

## 2016-06-29 DIAGNOSIS — H2513 Age-related nuclear cataract, bilateral: Secondary | ICD-10-CM | POA: Diagnosis not present

## 2016-06-30 DIAGNOSIS — Z992 Dependence on renal dialysis: Secondary | ICD-10-CM | POA: Diagnosis not present

## 2016-06-30 DIAGNOSIS — D509 Iron deficiency anemia, unspecified: Secondary | ICD-10-CM | POA: Diagnosis not present

## 2016-06-30 DIAGNOSIS — D631 Anemia in chronic kidney disease: Secondary | ICD-10-CM | POA: Diagnosis not present

## 2016-06-30 DIAGNOSIS — N2581 Secondary hyperparathyroidism of renal origin: Secondary | ICD-10-CM | POA: Diagnosis not present

## 2016-06-30 DIAGNOSIS — N186 End stage renal disease: Secondary | ICD-10-CM | POA: Diagnosis not present

## 2016-07-02 DIAGNOSIS — N2581 Secondary hyperparathyroidism of renal origin: Secondary | ICD-10-CM | POA: Diagnosis not present

## 2016-07-02 DIAGNOSIS — Z992 Dependence on renal dialysis: Secondary | ICD-10-CM | POA: Diagnosis not present

## 2016-07-02 DIAGNOSIS — D631 Anemia in chronic kidney disease: Secondary | ICD-10-CM | POA: Diagnosis not present

## 2016-07-02 DIAGNOSIS — D509 Iron deficiency anemia, unspecified: Secondary | ICD-10-CM | POA: Diagnosis not present

## 2016-07-02 DIAGNOSIS — N186 End stage renal disease: Secondary | ICD-10-CM | POA: Diagnosis not present

## 2016-07-04 DIAGNOSIS — D509 Iron deficiency anemia, unspecified: Secondary | ICD-10-CM | POA: Diagnosis not present

## 2016-07-04 DIAGNOSIS — N186 End stage renal disease: Secondary | ICD-10-CM | POA: Diagnosis not present

## 2016-07-04 DIAGNOSIS — D631 Anemia in chronic kidney disease: Secondary | ICD-10-CM | POA: Diagnosis not present

## 2016-07-04 DIAGNOSIS — Z992 Dependence on renal dialysis: Secondary | ICD-10-CM | POA: Diagnosis not present

## 2016-07-04 DIAGNOSIS — N2581 Secondary hyperparathyroidism of renal origin: Secondary | ICD-10-CM | POA: Diagnosis not present

## 2016-07-07 DIAGNOSIS — N186 End stage renal disease: Secondary | ICD-10-CM | POA: Diagnosis not present

## 2016-07-07 DIAGNOSIS — N2581 Secondary hyperparathyroidism of renal origin: Secondary | ICD-10-CM | POA: Diagnosis not present

## 2016-07-07 DIAGNOSIS — Z992 Dependence on renal dialysis: Secondary | ICD-10-CM | POA: Diagnosis not present

## 2016-07-07 DIAGNOSIS — D631 Anemia in chronic kidney disease: Secondary | ICD-10-CM | POA: Diagnosis not present

## 2016-07-07 DIAGNOSIS — D509 Iron deficiency anemia, unspecified: Secondary | ICD-10-CM | POA: Diagnosis not present

## 2016-07-09 DIAGNOSIS — Z992 Dependence on renal dialysis: Secondary | ICD-10-CM | POA: Diagnosis not present

## 2016-07-09 DIAGNOSIS — D509 Iron deficiency anemia, unspecified: Secondary | ICD-10-CM | POA: Diagnosis not present

## 2016-07-09 DIAGNOSIS — D631 Anemia in chronic kidney disease: Secondary | ICD-10-CM | POA: Diagnosis not present

## 2016-07-09 DIAGNOSIS — N186 End stage renal disease: Secondary | ICD-10-CM | POA: Diagnosis not present

## 2016-07-09 DIAGNOSIS — N2581 Secondary hyperparathyroidism of renal origin: Secondary | ICD-10-CM | POA: Diagnosis not present

## 2016-07-11 DIAGNOSIS — D509 Iron deficiency anemia, unspecified: Secondary | ICD-10-CM | POA: Diagnosis not present

## 2016-07-11 DIAGNOSIS — Z992 Dependence on renal dialysis: Secondary | ICD-10-CM | POA: Diagnosis not present

## 2016-07-11 DIAGNOSIS — N2581 Secondary hyperparathyroidism of renal origin: Secondary | ICD-10-CM | POA: Diagnosis not present

## 2016-07-11 DIAGNOSIS — D631 Anemia in chronic kidney disease: Secondary | ICD-10-CM | POA: Diagnosis not present

## 2016-07-11 DIAGNOSIS — N186 End stage renal disease: Secondary | ICD-10-CM | POA: Diagnosis not present

## 2016-07-14 DIAGNOSIS — Z992 Dependence on renal dialysis: Secondary | ICD-10-CM | POA: Diagnosis not present

## 2016-07-14 DIAGNOSIS — N186 End stage renal disease: Secondary | ICD-10-CM | POA: Diagnosis not present

## 2016-07-14 DIAGNOSIS — D509 Iron deficiency anemia, unspecified: Secondary | ICD-10-CM | POA: Diagnosis not present

## 2016-07-14 DIAGNOSIS — D631 Anemia in chronic kidney disease: Secondary | ICD-10-CM | POA: Diagnosis not present

## 2016-07-14 DIAGNOSIS — N2581 Secondary hyperparathyroidism of renal origin: Secondary | ICD-10-CM | POA: Diagnosis not present

## 2016-07-16 DIAGNOSIS — N186 End stage renal disease: Secondary | ICD-10-CM | POA: Diagnosis not present

## 2016-07-16 DIAGNOSIS — D509 Iron deficiency anemia, unspecified: Secondary | ICD-10-CM | POA: Diagnosis not present

## 2016-07-16 DIAGNOSIS — D631 Anemia in chronic kidney disease: Secondary | ICD-10-CM | POA: Diagnosis not present

## 2016-07-16 DIAGNOSIS — Z992 Dependence on renal dialysis: Secondary | ICD-10-CM | POA: Diagnosis not present

## 2016-07-16 DIAGNOSIS — N2581 Secondary hyperparathyroidism of renal origin: Secondary | ICD-10-CM | POA: Diagnosis not present

## 2016-07-18 DIAGNOSIS — N2581 Secondary hyperparathyroidism of renal origin: Secondary | ICD-10-CM | POA: Diagnosis not present

## 2016-07-18 DIAGNOSIS — Z992 Dependence on renal dialysis: Secondary | ICD-10-CM | POA: Diagnosis not present

## 2016-07-18 DIAGNOSIS — N186 End stage renal disease: Secondary | ICD-10-CM | POA: Diagnosis not present

## 2016-07-18 DIAGNOSIS — D631 Anemia in chronic kidney disease: Secondary | ICD-10-CM | POA: Diagnosis not present

## 2016-07-18 DIAGNOSIS — D509 Iron deficiency anemia, unspecified: Secondary | ICD-10-CM | POA: Diagnosis not present

## 2016-07-21 DIAGNOSIS — N2581 Secondary hyperparathyroidism of renal origin: Secondary | ICD-10-CM | POA: Diagnosis not present

## 2016-07-21 DIAGNOSIS — D631 Anemia in chronic kidney disease: Secondary | ICD-10-CM | POA: Diagnosis not present

## 2016-07-21 DIAGNOSIS — Z992 Dependence on renal dialysis: Secondary | ICD-10-CM | POA: Diagnosis not present

## 2016-07-21 DIAGNOSIS — N186 End stage renal disease: Secondary | ICD-10-CM | POA: Diagnosis not present

## 2016-07-21 DIAGNOSIS — D509 Iron deficiency anemia, unspecified: Secondary | ICD-10-CM | POA: Diagnosis not present

## 2016-07-22 DIAGNOSIS — Z01818 Encounter for other preprocedural examination: Secondary | ICD-10-CM | POA: Diagnosis not present

## 2016-07-22 DIAGNOSIS — N186 End stage renal disease: Secondary | ICD-10-CM | POA: Diagnosis not present

## 2016-07-23 DIAGNOSIS — N186 End stage renal disease: Secondary | ICD-10-CM | POA: Diagnosis not present

## 2016-07-23 DIAGNOSIS — D509 Iron deficiency anemia, unspecified: Secondary | ICD-10-CM | POA: Diagnosis not present

## 2016-07-23 DIAGNOSIS — D631 Anemia in chronic kidney disease: Secondary | ICD-10-CM | POA: Diagnosis not present

## 2016-07-23 DIAGNOSIS — Z992 Dependence on renal dialysis: Secondary | ICD-10-CM | POA: Diagnosis not present

## 2016-07-23 DIAGNOSIS — N2581 Secondary hyperparathyroidism of renal origin: Secondary | ICD-10-CM | POA: Diagnosis not present

## 2016-07-25 DIAGNOSIS — Z992 Dependence on renal dialysis: Secondary | ICD-10-CM | POA: Diagnosis not present

## 2016-07-25 DIAGNOSIS — D509 Iron deficiency anemia, unspecified: Secondary | ICD-10-CM | POA: Diagnosis not present

## 2016-07-25 DIAGNOSIS — D631 Anemia in chronic kidney disease: Secondary | ICD-10-CM | POA: Diagnosis not present

## 2016-07-25 DIAGNOSIS — N186 End stage renal disease: Secondary | ICD-10-CM | POA: Diagnosis not present

## 2016-07-25 DIAGNOSIS — N2581 Secondary hyperparathyroidism of renal origin: Secondary | ICD-10-CM | POA: Diagnosis not present

## 2016-07-27 ENCOUNTER — Other Ambulatory Visit: Payer: Self-pay | Admitting: Family Medicine

## 2016-07-27 DIAGNOSIS — Z992 Dependence on renal dialysis: Secondary | ICD-10-CM | POA: Diagnosis not present

## 2016-07-27 DIAGNOSIS — N186 End stage renal disease: Secondary | ICD-10-CM | POA: Diagnosis not present

## 2016-07-28 DIAGNOSIS — D631 Anemia in chronic kidney disease: Secondary | ICD-10-CM | POA: Diagnosis not present

## 2016-07-28 DIAGNOSIS — N186 End stage renal disease: Secondary | ICD-10-CM | POA: Diagnosis not present

## 2016-07-28 DIAGNOSIS — D509 Iron deficiency anemia, unspecified: Secondary | ICD-10-CM | POA: Diagnosis not present

## 2016-07-28 DIAGNOSIS — Z992 Dependence on renal dialysis: Secondary | ICD-10-CM | POA: Diagnosis not present

## 2016-07-30 DIAGNOSIS — D631 Anemia in chronic kidney disease: Secondary | ICD-10-CM | POA: Diagnosis not present

## 2016-07-30 DIAGNOSIS — D509 Iron deficiency anemia, unspecified: Secondary | ICD-10-CM | POA: Diagnosis not present

## 2016-07-30 DIAGNOSIS — Z992 Dependence on renal dialysis: Secondary | ICD-10-CM | POA: Diagnosis not present

## 2016-07-30 DIAGNOSIS — N186 End stage renal disease: Secondary | ICD-10-CM | POA: Diagnosis not present

## 2016-08-01 DIAGNOSIS — D509 Iron deficiency anemia, unspecified: Secondary | ICD-10-CM | POA: Diagnosis not present

## 2016-08-01 DIAGNOSIS — D631 Anemia in chronic kidney disease: Secondary | ICD-10-CM | POA: Diagnosis not present

## 2016-08-01 DIAGNOSIS — Z992 Dependence on renal dialysis: Secondary | ICD-10-CM | POA: Diagnosis not present

## 2016-08-01 DIAGNOSIS — N186 End stage renal disease: Secondary | ICD-10-CM | POA: Diagnosis not present

## 2016-08-03 DIAGNOSIS — J449 Chronic obstructive pulmonary disease, unspecified: Secondary | ICD-10-CM | POA: Diagnosis not present

## 2016-08-03 DIAGNOSIS — R0602 Shortness of breath: Secondary | ICD-10-CM | POA: Diagnosis not present

## 2016-08-03 DIAGNOSIS — F17211 Nicotine dependence, cigarettes, in remission: Secondary | ICD-10-CM | POA: Diagnosis not present

## 2016-08-03 DIAGNOSIS — J9611 Chronic respiratory failure with hypoxia: Secondary | ICD-10-CM | POA: Diagnosis not present

## 2016-08-04 DIAGNOSIS — D631 Anemia in chronic kidney disease: Secondary | ICD-10-CM | POA: Diagnosis not present

## 2016-08-04 DIAGNOSIS — Z992 Dependence on renal dialysis: Secondary | ICD-10-CM | POA: Diagnosis not present

## 2016-08-04 DIAGNOSIS — D509 Iron deficiency anemia, unspecified: Secondary | ICD-10-CM | POA: Diagnosis not present

## 2016-08-04 DIAGNOSIS — N186 End stage renal disease: Secondary | ICD-10-CM | POA: Diagnosis not present

## 2016-08-06 DIAGNOSIS — N186 End stage renal disease: Secondary | ICD-10-CM | POA: Diagnosis not present

## 2016-08-06 DIAGNOSIS — D631 Anemia in chronic kidney disease: Secondary | ICD-10-CM | POA: Diagnosis not present

## 2016-08-06 DIAGNOSIS — D509 Iron deficiency anemia, unspecified: Secondary | ICD-10-CM | POA: Diagnosis not present

## 2016-08-06 DIAGNOSIS — Z992 Dependence on renal dialysis: Secondary | ICD-10-CM | POA: Diagnosis not present

## 2016-08-08 DIAGNOSIS — N186 End stage renal disease: Secondary | ICD-10-CM | POA: Diagnosis not present

## 2016-08-08 DIAGNOSIS — Z992 Dependence on renal dialysis: Secondary | ICD-10-CM | POA: Diagnosis not present

## 2016-08-08 DIAGNOSIS — D509 Iron deficiency anemia, unspecified: Secondary | ICD-10-CM | POA: Diagnosis not present

## 2016-08-08 DIAGNOSIS — D631 Anemia in chronic kidney disease: Secondary | ICD-10-CM | POA: Diagnosis not present

## 2016-08-11 DIAGNOSIS — D631 Anemia in chronic kidney disease: Secondary | ICD-10-CM | POA: Diagnosis not present

## 2016-08-11 DIAGNOSIS — D509 Iron deficiency anemia, unspecified: Secondary | ICD-10-CM | POA: Diagnosis not present

## 2016-08-11 DIAGNOSIS — Z992 Dependence on renal dialysis: Secondary | ICD-10-CM | POA: Diagnosis not present

## 2016-08-11 DIAGNOSIS — N186 End stage renal disease: Secondary | ICD-10-CM | POA: Diagnosis not present

## 2016-08-12 ENCOUNTER — Ambulatory Visit (INDEPENDENT_AMBULATORY_CARE_PROVIDER_SITE_OTHER): Payer: Medicare Other

## 2016-08-12 DIAGNOSIS — I713 Abdominal aortic aneurysm, ruptured, unspecified: Secondary | ICD-10-CM

## 2016-08-13 DIAGNOSIS — N186 End stage renal disease: Secondary | ICD-10-CM | POA: Diagnosis not present

## 2016-08-13 DIAGNOSIS — D509 Iron deficiency anemia, unspecified: Secondary | ICD-10-CM | POA: Diagnosis not present

## 2016-08-13 DIAGNOSIS — Z992 Dependence on renal dialysis: Secondary | ICD-10-CM | POA: Diagnosis not present

## 2016-08-13 DIAGNOSIS — D631 Anemia in chronic kidney disease: Secondary | ICD-10-CM | POA: Diagnosis not present

## 2016-08-14 DIAGNOSIS — H16142 Punctate keratitis, left eye: Secondary | ICD-10-CM | POA: Diagnosis not present

## 2016-08-15 DIAGNOSIS — D631 Anemia in chronic kidney disease: Secondary | ICD-10-CM | POA: Diagnosis not present

## 2016-08-15 DIAGNOSIS — D509 Iron deficiency anemia, unspecified: Secondary | ICD-10-CM | POA: Diagnosis not present

## 2016-08-15 DIAGNOSIS — N186 End stage renal disease: Secondary | ICD-10-CM | POA: Diagnosis not present

## 2016-08-15 DIAGNOSIS — Z992 Dependence on renal dialysis: Secondary | ICD-10-CM | POA: Diagnosis not present

## 2016-08-17 DIAGNOSIS — B0052 Herpesviral keratitis: Secondary | ICD-10-CM | POA: Diagnosis not present

## 2016-08-18 DIAGNOSIS — D631 Anemia in chronic kidney disease: Secondary | ICD-10-CM | POA: Diagnosis not present

## 2016-08-18 DIAGNOSIS — N186 End stage renal disease: Secondary | ICD-10-CM | POA: Diagnosis not present

## 2016-08-18 DIAGNOSIS — Z992 Dependence on renal dialysis: Secondary | ICD-10-CM | POA: Diagnosis not present

## 2016-08-18 DIAGNOSIS — D509 Iron deficiency anemia, unspecified: Secondary | ICD-10-CM | POA: Diagnosis not present

## 2016-08-19 DIAGNOSIS — B0052 Herpesviral keratitis: Secondary | ICD-10-CM | POA: Diagnosis not present

## 2016-08-19 DIAGNOSIS — H04123 Dry eye syndrome of bilateral lacrimal glands: Secondary | ICD-10-CM | POA: Diagnosis not present

## 2016-08-20 DIAGNOSIS — D509 Iron deficiency anemia, unspecified: Secondary | ICD-10-CM | POA: Diagnosis not present

## 2016-08-20 DIAGNOSIS — Z992 Dependence on renal dialysis: Secondary | ICD-10-CM | POA: Diagnosis not present

## 2016-08-20 DIAGNOSIS — N186 End stage renal disease: Secondary | ICD-10-CM | POA: Diagnosis not present

## 2016-08-20 DIAGNOSIS — D631 Anemia in chronic kidney disease: Secondary | ICD-10-CM | POA: Diagnosis not present

## 2016-08-22 DIAGNOSIS — Z992 Dependence on renal dialysis: Secondary | ICD-10-CM | POA: Diagnosis not present

## 2016-08-22 DIAGNOSIS — N186 End stage renal disease: Secondary | ICD-10-CM | POA: Diagnosis not present

## 2016-08-22 DIAGNOSIS — D631 Anemia in chronic kidney disease: Secondary | ICD-10-CM | POA: Diagnosis not present

## 2016-08-22 DIAGNOSIS — D509 Iron deficiency anemia, unspecified: Secondary | ICD-10-CM | POA: Diagnosis not present

## 2016-08-25 DIAGNOSIS — D631 Anemia in chronic kidney disease: Secondary | ICD-10-CM | POA: Diagnosis not present

## 2016-08-25 DIAGNOSIS — D509 Iron deficiency anemia, unspecified: Secondary | ICD-10-CM | POA: Diagnosis not present

## 2016-08-25 DIAGNOSIS — Z992 Dependence on renal dialysis: Secondary | ICD-10-CM | POA: Diagnosis not present

## 2016-08-25 DIAGNOSIS — N186 End stage renal disease: Secondary | ICD-10-CM | POA: Diagnosis not present

## 2016-08-26 ENCOUNTER — Encounter (INDEPENDENT_AMBULATORY_CARE_PROVIDER_SITE_OTHER): Payer: Medicare Other

## 2016-08-26 ENCOUNTER — Other Ambulatory Visit: Payer: Self-pay | Admitting: Family Medicine

## 2016-08-26 ENCOUNTER — Ambulatory Visit (INDEPENDENT_AMBULATORY_CARE_PROVIDER_SITE_OTHER): Payer: Medicare Other | Admitting: Vascular Surgery

## 2016-08-26 DIAGNOSIS — H04123 Dry eye syndrome of bilateral lacrimal glands: Secondary | ICD-10-CM | POA: Diagnosis not present

## 2016-08-26 DIAGNOSIS — E038 Other specified hypothyroidism: Secondary | ICD-10-CM | POA: Diagnosis not present

## 2016-08-26 DIAGNOSIS — E063 Autoimmune thyroiditis: Secondary | ICD-10-CM | POA: Diagnosis not present

## 2016-08-27 DIAGNOSIS — D631 Anemia in chronic kidney disease: Secondary | ICD-10-CM | POA: Diagnosis not present

## 2016-08-27 DIAGNOSIS — Z992 Dependence on renal dialysis: Secondary | ICD-10-CM | POA: Diagnosis not present

## 2016-08-27 DIAGNOSIS — D509 Iron deficiency anemia, unspecified: Secondary | ICD-10-CM | POA: Diagnosis not present

## 2016-08-27 DIAGNOSIS — N186 End stage renal disease: Secondary | ICD-10-CM | POA: Diagnosis not present

## 2016-08-29 DIAGNOSIS — Z23 Encounter for immunization: Secondary | ICD-10-CM | POA: Diagnosis not present

## 2016-08-29 DIAGNOSIS — D509 Iron deficiency anemia, unspecified: Secondary | ICD-10-CM | POA: Diagnosis not present

## 2016-08-29 DIAGNOSIS — Z992 Dependence on renal dialysis: Secondary | ICD-10-CM | POA: Diagnosis not present

## 2016-08-29 DIAGNOSIS — D631 Anemia in chronic kidney disease: Secondary | ICD-10-CM | POA: Diagnosis not present

## 2016-08-29 DIAGNOSIS — N186 End stage renal disease: Secondary | ICD-10-CM | POA: Diagnosis not present

## 2016-08-31 DIAGNOSIS — E038 Other specified hypothyroidism: Secondary | ICD-10-CM | POA: Diagnosis not present

## 2016-08-31 DIAGNOSIS — E063 Autoimmune thyroiditis: Secondary | ICD-10-CM | POA: Diagnosis not present

## 2016-09-01 DIAGNOSIS — N186 End stage renal disease: Secondary | ICD-10-CM | POA: Diagnosis not present

## 2016-09-01 DIAGNOSIS — Z992 Dependence on renal dialysis: Secondary | ICD-10-CM | POA: Diagnosis not present

## 2016-09-01 DIAGNOSIS — D631 Anemia in chronic kidney disease: Secondary | ICD-10-CM | POA: Diagnosis not present

## 2016-09-01 DIAGNOSIS — D509 Iron deficiency anemia, unspecified: Secondary | ICD-10-CM | POA: Diagnosis not present

## 2016-09-01 DIAGNOSIS — Z23 Encounter for immunization: Secondary | ICD-10-CM | POA: Diagnosis not present

## 2016-09-03 DIAGNOSIS — Z992 Dependence on renal dialysis: Secondary | ICD-10-CM | POA: Diagnosis not present

## 2016-09-03 DIAGNOSIS — D631 Anemia in chronic kidney disease: Secondary | ICD-10-CM | POA: Diagnosis not present

## 2016-09-03 DIAGNOSIS — Z23 Encounter for immunization: Secondary | ICD-10-CM | POA: Diagnosis not present

## 2016-09-03 DIAGNOSIS — D509 Iron deficiency anemia, unspecified: Secondary | ICD-10-CM | POA: Diagnosis not present

## 2016-09-03 DIAGNOSIS — N186 End stage renal disease: Secondary | ICD-10-CM | POA: Diagnosis not present

## 2016-09-05 DIAGNOSIS — N186 End stage renal disease: Secondary | ICD-10-CM | POA: Diagnosis not present

## 2016-09-05 DIAGNOSIS — Z992 Dependence on renal dialysis: Secondary | ICD-10-CM | POA: Diagnosis not present

## 2016-09-05 DIAGNOSIS — D509 Iron deficiency anemia, unspecified: Secondary | ICD-10-CM | POA: Diagnosis not present

## 2016-09-05 DIAGNOSIS — Z23 Encounter for immunization: Secondary | ICD-10-CM | POA: Diagnosis not present

## 2016-09-05 DIAGNOSIS — D631 Anemia in chronic kidney disease: Secondary | ICD-10-CM | POA: Diagnosis not present

## 2016-09-08 DIAGNOSIS — D509 Iron deficiency anemia, unspecified: Secondary | ICD-10-CM | POA: Diagnosis not present

## 2016-09-08 DIAGNOSIS — Z23 Encounter for immunization: Secondary | ICD-10-CM | POA: Diagnosis not present

## 2016-09-08 DIAGNOSIS — N186 End stage renal disease: Secondary | ICD-10-CM | POA: Diagnosis not present

## 2016-09-08 DIAGNOSIS — Z992 Dependence on renal dialysis: Secondary | ICD-10-CM | POA: Diagnosis not present

## 2016-09-08 DIAGNOSIS — D631 Anemia in chronic kidney disease: Secondary | ICD-10-CM | POA: Diagnosis not present

## 2016-09-10 ENCOUNTER — Other Ambulatory Visit: Payer: Self-pay | Admitting: Family Medicine

## 2016-09-10 DIAGNOSIS — Z23 Encounter for immunization: Secondary | ICD-10-CM | POA: Diagnosis not present

## 2016-09-10 DIAGNOSIS — D509 Iron deficiency anemia, unspecified: Secondary | ICD-10-CM | POA: Diagnosis not present

## 2016-09-10 DIAGNOSIS — Z992 Dependence on renal dialysis: Secondary | ICD-10-CM | POA: Diagnosis not present

## 2016-09-10 DIAGNOSIS — N186 End stage renal disease: Secondary | ICD-10-CM | POA: Diagnosis not present

## 2016-09-10 DIAGNOSIS — D631 Anemia in chronic kidney disease: Secondary | ICD-10-CM | POA: Diagnosis not present

## 2016-09-12 DIAGNOSIS — D509 Iron deficiency anemia, unspecified: Secondary | ICD-10-CM | POA: Diagnosis not present

## 2016-09-12 DIAGNOSIS — D631 Anemia in chronic kidney disease: Secondary | ICD-10-CM | POA: Diagnosis not present

## 2016-09-12 DIAGNOSIS — N186 End stage renal disease: Secondary | ICD-10-CM | POA: Diagnosis not present

## 2016-09-12 DIAGNOSIS — Z23 Encounter for immunization: Secondary | ICD-10-CM | POA: Diagnosis not present

## 2016-09-12 DIAGNOSIS — Z992 Dependence on renal dialysis: Secondary | ICD-10-CM | POA: Diagnosis not present

## 2016-09-15 DIAGNOSIS — D631 Anemia in chronic kidney disease: Secondary | ICD-10-CM | POA: Diagnosis not present

## 2016-09-15 DIAGNOSIS — N186 End stage renal disease: Secondary | ICD-10-CM | POA: Diagnosis not present

## 2016-09-15 DIAGNOSIS — Z992 Dependence on renal dialysis: Secondary | ICD-10-CM | POA: Diagnosis not present

## 2016-09-15 DIAGNOSIS — Z23 Encounter for immunization: Secondary | ICD-10-CM | POA: Diagnosis not present

## 2016-09-15 DIAGNOSIS — D509 Iron deficiency anemia, unspecified: Secondary | ICD-10-CM | POA: Diagnosis not present

## 2016-09-16 DIAGNOSIS — B0052 Herpesviral keratitis: Secondary | ICD-10-CM | POA: Diagnosis not present

## 2016-09-16 DIAGNOSIS — H04123 Dry eye syndrome of bilateral lacrimal glands: Secondary | ICD-10-CM | POA: Diagnosis not present

## 2016-09-17 DIAGNOSIS — D631 Anemia in chronic kidney disease: Secondary | ICD-10-CM | POA: Diagnosis not present

## 2016-09-17 DIAGNOSIS — N186 End stage renal disease: Secondary | ICD-10-CM | POA: Diagnosis not present

## 2016-09-17 DIAGNOSIS — Z23 Encounter for immunization: Secondary | ICD-10-CM | POA: Diagnosis not present

## 2016-09-17 DIAGNOSIS — Z992 Dependence on renal dialysis: Secondary | ICD-10-CM | POA: Diagnosis not present

## 2016-09-17 DIAGNOSIS — D509 Iron deficiency anemia, unspecified: Secondary | ICD-10-CM | POA: Diagnosis not present

## 2016-09-19 DIAGNOSIS — D631 Anemia in chronic kidney disease: Secondary | ICD-10-CM | POA: Diagnosis not present

## 2016-09-19 DIAGNOSIS — D509 Iron deficiency anemia, unspecified: Secondary | ICD-10-CM | POA: Diagnosis not present

## 2016-09-19 DIAGNOSIS — Z992 Dependence on renal dialysis: Secondary | ICD-10-CM | POA: Diagnosis not present

## 2016-09-19 DIAGNOSIS — Z23 Encounter for immunization: Secondary | ICD-10-CM | POA: Diagnosis not present

## 2016-09-19 DIAGNOSIS — N186 End stage renal disease: Secondary | ICD-10-CM | POA: Diagnosis not present

## 2016-09-21 ENCOUNTER — Other Ambulatory Visit (INDEPENDENT_AMBULATORY_CARE_PROVIDER_SITE_OTHER): Payer: Self-pay | Admitting: Vascular Surgery

## 2016-09-21 ENCOUNTER — Encounter (INDEPENDENT_AMBULATORY_CARE_PROVIDER_SITE_OTHER): Payer: Self-pay | Admitting: Vascular Surgery

## 2016-09-21 ENCOUNTER — Ambulatory Visit (INDEPENDENT_AMBULATORY_CARE_PROVIDER_SITE_OTHER): Payer: Medicare Other | Admitting: Vascular Surgery

## 2016-09-21 ENCOUNTER — Ambulatory Visit (INDEPENDENT_AMBULATORY_CARE_PROVIDER_SITE_OTHER): Payer: Medicare Other

## 2016-09-21 VITALS — BP 136/68 | HR 74 | Resp 15 | Ht 64.5 in | Wt 128.0 lb

## 2016-09-21 DIAGNOSIS — I713 Abdominal aortic aneurysm, ruptured, unspecified: Secondary | ICD-10-CM

## 2016-09-21 DIAGNOSIS — D75829 Heparin-induced thrombocytopenia, unspecified: Secondary | ICD-10-CM

## 2016-09-21 DIAGNOSIS — Z992 Dependence on renal dialysis: Secondary | ICD-10-CM

## 2016-09-21 DIAGNOSIS — J438 Other emphysema: Secondary | ICD-10-CM | POA: Diagnosis not present

## 2016-09-21 DIAGNOSIS — I10 Essential (primary) hypertension: Secondary | ICD-10-CM

## 2016-09-21 DIAGNOSIS — N186 End stage renal disease: Secondary | ICD-10-CM

## 2016-09-21 DIAGNOSIS — T829XXA Unspecified complication of cardiac and vascular prosthetic device, implant and graft, initial encounter: Secondary | ICD-10-CM | POA: Insufficient documentation

## 2016-09-21 DIAGNOSIS — T829XXS Unspecified complication of cardiac and vascular prosthetic device, implant and graft, sequela: Secondary | ICD-10-CM

## 2016-09-21 DIAGNOSIS — D7582 Heparin induced thrombocytopenia (HIT): Secondary | ICD-10-CM
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
## 2016-09-22 DIAGNOSIS — D631 Anemia in chronic kidney disease: Secondary | ICD-10-CM | POA: Diagnosis not present

## 2016-09-22 DIAGNOSIS — Z992 Dependence on renal dialysis: Secondary | ICD-10-CM | POA: Diagnosis not present

## 2016-09-22 DIAGNOSIS — D509 Iron deficiency anemia, unspecified: Secondary | ICD-10-CM | POA: Diagnosis not present

## 2016-09-22 DIAGNOSIS — N186 End stage renal disease: Secondary | ICD-10-CM | POA: Diagnosis not present

## 2016-09-22 DIAGNOSIS — Z23 Encounter for immunization: Secondary | ICD-10-CM | POA: Diagnosis not present

## 2016-09-24 DIAGNOSIS — D631 Anemia in chronic kidney disease: Secondary | ICD-10-CM | POA: Diagnosis not present

## 2016-09-24 DIAGNOSIS — D509 Iron deficiency anemia, unspecified: Secondary | ICD-10-CM | POA: Diagnosis not present

## 2016-09-24 DIAGNOSIS — N186 End stage renal disease: Secondary | ICD-10-CM | POA: Diagnosis not present

## 2016-09-24 DIAGNOSIS — Z23 Encounter for immunization: Secondary | ICD-10-CM | POA: Diagnosis not present

## 2016-09-24 DIAGNOSIS — Z992 Dependence on renal dialysis: Secondary | ICD-10-CM | POA: Diagnosis not present

## 2016-09-26 DIAGNOSIS — N186 End stage renal disease: Secondary | ICD-10-CM | POA: Diagnosis not present

## 2016-09-26 DIAGNOSIS — Z992 Dependence on renal dialysis: Secondary | ICD-10-CM | POA: Diagnosis not present

## 2016-09-26 DIAGNOSIS — D631 Anemia in chronic kidney disease: Secondary | ICD-10-CM | POA: Diagnosis not present

## 2016-09-26 DIAGNOSIS — Z23 Encounter for immunization: Secondary | ICD-10-CM | POA: Diagnosis not present

## 2016-09-26 DIAGNOSIS — D509 Iron deficiency anemia, unspecified: Secondary | ICD-10-CM | POA: Diagnosis not present

## 2016-09-29 DIAGNOSIS — D509 Iron deficiency anemia, unspecified: Secondary | ICD-10-CM | POA: Diagnosis not present

## 2016-09-29 DIAGNOSIS — N2581 Secondary hyperparathyroidism of renal origin: Secondary | ICD-10-CM | POA: Diagnosis not present

## 2016-09-29 DIAGNOSIS — N186 End stage renal disease: Secondary | ICD-10-CM | POA: Diagnosis not present

## 2016-09-29 DIAGNOSIS — D631 Anemia in chronic kidney disease: Secondary | ICD-10-CM | POA: Diagnosis not present

## 2016-09-29 DIAGNOSIS — Z992 Dependence on renal dialysis: Secondary | ICD-10-CM | POA: Diagnosis not present

## 2016-10-01 DIAGNOSIS — N186 End stage renal disease: Secondary | ICD-10-CM | POA: Diagnosis not present

## 2016-10-01 DIAGNOSIS — D631 Anemia in chronic kidney disease: Secondary | ICD-10-CM | POA: Diagnosis not present

## 2016-10-01 DIAGNOSIS — N2581 Secondary hyperparathyroidism of renal origin: Secondary | ICD-10-CM | POA: Diagnosis not present

## 2016-10-01 DIAGNOSIS — D509 Iron deficiency anemia, unspecified: Secondary | ICD-10-CM | POA: Diagnosis not present

## 2016-10-01 DIAGNOSIS — Z992 Dependence on renal dialysis: Secondary | ICD-10-CM | POA: Diagnosis not present

## 2016-10-02 DIAGNOSIS — H04123 Dry eye syndrome of bilateral lacrimal glands: Secondary | ICD-10-CM | POA: Diagnosis not present

## 2016-10-02 DIAGNOSIS — B0052 Herpesviral keratitis: Secondary | ICD-10-CM | POA: Diagnosis not present

## 2016-10-03 DIAGNOSIS — Z992 Dependence on renal dialysis: Secondary | ICD-10-CM | POA: Diagnosis not present

## 2016-10-03 DIAGNOSIS — D631 Anemia in chronic kidney disease: Secondary | ICD-10-CM | POA: Diagnosis not present

## 2016-10-03 DIAGNOSIS — N186 End stage renal disease: Secondary | ICD-10-CM | POA: Diagnosis not present

## 2016-10-03 DIAGNOSIS — N2581 Secondary hyperparathyroidism of renal origin: Secondary | ICD-10-CM | POA: Diagnosis not present

## 2016-10-03 DIAGNOSIS — D509 Iron deficiency anemia, unspecified: Secondary | ICD-10-CM | POA: Diagnosis not present

## 2016-10-06 DIAGNOSIS — D509 Iron deficiency anemia, unspecified: Secondary | ICD-10-CM | POA: Diagnosis not present

## 2016-10-06 DIAGNOSIS — N2581 Secondary hyperparathyroidism of renal origin: Secondary | ICD-10-CM | POA: Diagnosis not present

## 2016-10-06 DIAGNOSIS — D631 Anemia in chronic kidney disease: Secondary | ICD-10-CM | POA: Diagnosis not present

## 2016-10-06 DIAGNOSIS — Z992 Dependence on renal dialysis: Secondary | ICD-10-CM | POA: Diagnosis not present

## 2016-10-06 DIAGNOSIS — N186 End stage renal disease: Secondary | ICD-10-CM | POA: Diagnosis not present

## 2016-10-08 DIAGNOSIS — N2581 Secondary hyperparathyroidism of renal origin: Secondary | ICD-10-CM | POA: Diagnosis not present

## 2016-10-08 DIAGNOSIS — D631 Anemia in chronic kidney disease: Secondary | ICD-10-CM | POA: Diagnosis not present

## 2016-10-08 DIAGNOSIS — Z992 Dependence on renal dialysis: Secondary | ICD-10-CM | POA: Diagnosis not present

## 2016-10-08 DIAGNOSIS — D509 Iron deficiency anemia, unspecified: Secondary | ICD-10-CM | POA: Diagnosis not present

## 2016-10-08 DIAGNOSIS — N186 End stage renal disease: Secondary | ICD-10-CM | POA: Diagnosis not present

## 2016-10-10 DIAGNOSIS — N186 End stage renal disease: Secondary | ICD-10-CM | POA: Diagnosis not present

## 2016-10-10 DIAGNOSIS — N2581 Secondary hyperparathyroidism of renal origin: Secondary | ICD-10-CM | POA: Diagnosis not present

## 2016-10-10 DIAGNOSIS — D631 Anemia in chronic kidney disease: Secondary | ICD-10-CM | POA: Diagnosis not present

## 2016-10-10 DIAGNOSIS — Z992 Dependence on renal dialysis: Secondary | ICD-10-CM | POA: Diagnosis not present

## 2016-10-10 DIAGNOSIS — D509 Iron deficiency anemia, unspecified: Secondary | ICD-10-CM | POA: Diagnosis not present

## 2016-10-13 DIAGNOSIS — D631 Anemia in chronic kidney disease: Secondary | ICD-10-CM | POA: Diagnosis not present

## 2016-10-13 DIAGNOSIS — N186 End stage renal disease: Secondary | ICD-10-CM | POA: Diagnosis not present

## 2016-10-13 DIAGNOSIS — Z992 Dependence on renal dialysis: Secondary | ICD-10-CM | POA: Diagnosis not present

## 2016-10-13 DIAGNOSIS — N2581 Secondary hyperparathyroidism of renal origin: Secondary | ICD-10-CM | POA: Diagnosis not present

## 2016-10-13 DIAGNOSIS — D509 Iron deficiency anemia, unspecified: Secondary | ICD-10-CM | POA: Diagnosis not present

## 2016-10-14 DIAGNOSIS — R0602 Shortness of breath: Secondary | ICD-10-CM | POA: Diagnosis not present

## 2016-10-15 DIAGNOSIS — D631 Anemia in chronic kidney disease: Secondary | ICD-10-CM | POA: Diagnosis not present

## 2016-10-15 DIAGNOSIS — D509 Iron deficiency anemia, unspecified: Secondary | ICD-10-CM | POA: Diagnosis not present

## 2016-10-15 DIAGNOSIS — N2581 Secondary hyperparathyroidism of renal origin: Secondary | ICD-10-CM | POA: Diagnosis not present

## 2016-10-15 DIAGNOSIS — Z992 Dependence on renal dialysis: Secondary | ICD-10-CM | POA: Diagnosis not present

## 2016-10-15 DIAGNOSIS — N186 End stage renal disease: Secondary | ICD-10-CM | POA: Diagnosis not present

## 2016-10-16 DIAGNOSIS — H04123 Dry eye syndrome of bilateral lacrimal glands: Secondary | ICD-10-CM | POA: Diagnosis not present

## 2016-10-17 DIAGNOSIS — N186 End stage renal disease: Secondary | ICD-10-CM | POA: Diagnosis not present

## 2016-10-17 DIAGNOSIS — D631 Anemia in chronic kidney disease: Secondary | ICD-10-CM | POA: Diagnosis not present

## 2016-10-17 DIAGNOSIS — N2581 Secondary hyperparathyroidism of renal origin: Secondary | ICD-10-CM | POA: Diagnosis not present

## 2016-10-17 DIAGNOSIS — D509 Iron deficiency anemia, unspecified: Secondary | ICD-10-CM | POA: Diagnosis not present

## 2016-10-17 DIAGNOSIS — Z992 Dependence on renal dialysis: Secondary | ICD-10-CM | POA: Diagnosis not present

## 2016-10-20 DIAGNOSIS — D509 Iron deficiency anemia, unspecified: Secondary | ICD-10-CM | POA: Diagnosis not present

## 2016-10-20 DIAGNOSIS — N2581 Secondary hyperparathyroidism of renal origin: Secondary | ICD-10-CM | POA: Diagnosis not present

## 2016-10-20 DIAGNOSIS — D631 Anemia in chronic kidney disease: Secondary | ICD-10-CM | POA: Diagnosis not present

## 2016-10-20 DIAGNOSIS — Z992 Dependence on renal dialysis: Secondary | ICD-10-CM | POA: Diagnosis not present

## 2016-10-20 DIAGNOSIS — N186 End stage renal disease: Secondary | ICD-10-CM | POA: Diagnosis not present

## 2016-10-22 DIAGNOSIS — Z992 Dependence on renal dialysis: Secondary | ICD-10-CM | POA: Diagnosis not present

## 2016-10-22 DIAGNOSIS — N2581 Secondary hyperparathyroidism of renal origin: Secondary | ICD-10-CM | POA: Diagnosis not present

## 2016-10-22 DIAGNOSIS — D509 Iron deficiency anemia, unspecified: Secondary | ICD-10-CM | POA: Diagnosis not present

## 2016-10-22 DIAGNOSIS — D631 Anemia in chronic kidney disease: Secondary | ICD-10-CM | POA: Diagnosis not present

## 2016-10-22 DIAGNOSIS — N186 End stage renal disease: Secondary | ICD-10-CM | POA: Diagnosis not present

## 2016-10-24 DIAGNOSIS — D509 Iron deficiency anemia, unspecified: Secondary | ICD-10-CM | POA: Diagnosis not present

## 2016-10-24 DIAGNOSIS — D631 Anemia in chronic kidney disease: Secondary | ICD-10-CM | POA: Diagnosis not present

## 2016-10-24 DIAGNOSIS — N2581 Secondary hyperparathyroidism of renal origin: Secondary | ICD-10-CM | POA: Diagnosis not present

## 2016-10-24 DIAGNOSIS — N186 End stage renal disease: Secondary | ICD-10-CM | POA: Diagnosis not present

## 2016-10-24 DIAGNOSIS — Z992 Dependence on renal dialysis: Secondary | ICD-10-CM | POA: Diagnosis not present

## 2016-10-27 ENCOUNTER — Other Ambulatory Visit: Payer: Self-pay | Admitting: Family Medicine

## 2016-10-27 DIAGNOSIS — D631 Anemia in chronic kidney disease: Secondary | ICD-10-CM | POA: Diagnosis not present

## 2016-10-27 DIAGNOSIS — Z992 Dependence on renal dialysis: Secondary | ICD-10-CM | POA: Diagnosis not present

## 2016-10-27 DIAGNOSIS — N186 End stage renal disease: Secondary | ICD-10-CM | POA: Diagnosis not present

## 2016-10-27 DIAGNOSIS — N2581 Secondary hyperparathyroidism of renal origin: Secondary | ICD-10-CM | POA: Diagnosis not present

## 2016-10-27 DIAGNOSIS — D509 Iron deficiency anemia, unspecified: Secondary | ICD-10-CM | POA: Diagnosis not present

## 2016-10-29 DIAGNOSIS — D631 Anemia in chronic kidney disease: Secondary | ICD-10-CM | POA: Diagnosis not present

## 2016-10-29 DIAGNOSIS — N186 End stage renal disease: Secondary | ICD-10-CM | POA: Diagnosis not present

## 2016-10-29 DIAGNOSIS — Z992 Dependence on renal dialysis: Secondary | ICD-10-CM | POA: Diagnosis not present

## 2016-10-29 DIAGNOSIS — D509 Iron deficiency anemia, unspecified: Secondary | ICD-10-CM | POA: Diagnosis not present

## 2016-10-31 DIAGNOSIS — Z992 Dependence on renal dialysis: Secondary | ICD-10-CM | POA: Diagnosis not present

## 2016-10-31 DIAGNOSIS — N186 End stage renal disease: Secondary | ICD-10-CM | POA: Diagnosis not present

## 2016-10-31 DIAGNOSIS — D631 Anemia in chronic kidney disease: Secondary | ICD-10-CM | POA: Diagnosis not present

## 2016-10-31 DIAGNOSIS — D509 Iron deficiency anemia, unspecified: Secondary | ICD-10-CM | POA: Diagnosis not present

## 2016-11-03 DIAGNOSIS — D509 Iron deficiency anemia, unspecified: Secondary | ICD-10-CM | POA: Diagnosis not present

## 2016-11-03 DIAGNOSIS — N186 End stage renal disease: Secondary | ICD-10-CM | POA: Diagnosis not present

## 2016-11-03 DIAGNOSIS — Z992 Dependence on renal dialysis: Secondary | ICD-10-CM | POA: Diagnosis not present

## 2016-11-03 DIAGNOSIS — D631 Anemia in chronic kidney disease: Secondary | ICD-10-CM | POA: Diagnosis not present

## 2016-11-05 DIAGNOSIS — N186 End stage renal disease: Secondary | ICD-10-CM | POA: Diagnosis not present

## 2016-11-05 DIAGNOSIS — D631 Anemia in chronic kidney disease: Secondary | ICD-10-CM | POA: Diagnosis not present

## 2016-11-05 DIAGNOSIS — D509 Iron deficiency anemia, unspecified: Secondary | ICD-10-CM | POA: Diagnosis not present

## 2016-11-05 DIAGNOSIS — Z992 Dependence on renal dialysis: Secondary | ICD-10-CM | POA: Diagnosis not present

## 2016-11-07 DIAGNOSIS — D509 Iron deficiency anemia, unspecified: Secondary | ICD-10-CM | POA: Diagnosis not present

## 2016-11-07 DIAGNOSIS — D631 Anemia in chronic kidney disease: Secondary | ICD-10-CM | POA: Diagnosis not present

## 2016-11-07 DIAGNOSIS — N186 End stage renal disease: Secondary | ICD-10-CM | POA: Diagnosis not present

## 2016-11-07 DIAGNOSIS — Z992 Dependence on renal dialysis: Secondary | ICD-10-CM | POA: Diagnosis not present

## 2016-11-10 DIAGNOSIS — D631 Anemia in chronic kidney disease: Secondary | ICD-10-CM | POA: Diagnosis not present

## 2016-11-10 DIAGNOSIS — N186 End stage renal disease: Secondary | ICD-10-CM | POA: Diagnosis not present

## 2016-11-10 DIAGNOSIS — Z992 Dependence on renal dialysis: Secondary | ICD-10-CM | POA: Diagnosis not present

## 2016-11-10 DIAGNOSIS — D509 Iron deficiency anemia, unspecified: Secondary | ICD-10-CM | POA: Diagnosis not present

## 2016-11-12 DIAGNOSIS — D509 Iron deficiency anemia, unspecified: Secondary | ICD-10-CM | POA: Diagnosis not present

## 2016-11-12 DIAGNOSIS — D631 Anemia in chronic kidney disease: Secondary | ICD-10-CM | POA: Diagnosis not present

## 2016-11-12 DIAGNOSIS — Z992 Dependence on renal dialysis: Secondary | ICD-10-CM | POA: Diagnosis not present

## 2016-11-12 DIAGNOSIS — N186 End stage renal disease: Secondary | ICD-10-CM | POA: Diagnosis not present

## 2016-11-14 DIAGNOSIS — D631 Anemia in chronic kidney disease: Secondary | ICD-10-CM | POA: Diagnosis not present

## 2016-11-14 DIAGNOSIS — D509 Iron deficiency anemia, unspecified: Secondary | ICD-10-CM | POA: Diagnosis not present

## 2016-11-14 DIAGNOSIS — Z992 Dependence on renal dialysis: Secondary | ICD-10-CM | POA: Diagnosis not present

## 2016-11-14 DIAGNOSIS — N186 End stage renal disease: Secondary | ICD-10-CM | POA: Diagnosis not present

## 2016-11-16 ENCOUNTER — Telehealth: Payer: Self-pay | Admitting: Radiology

## 2016-11-16 ENCOUNTER — Ambulatory Visit: Payer: Medicare Other

## 2016-11-16 ENCOUNTER — Other Ambulatory Visit: Payer: Self-pay | Admitting: Family Medicine

## 2016-11-16 ENCOUNTER — Other Ambulatory Visit (INDEPENDENT_AMBULATORY_CARE_PROVIDER_SITE_OTHER): Payer: Medicare Other

## 2016-11-16 DIAGNOSIS — N186 End stage renal disease: Secondary | ICD-10-CM

## 2016-11-16 DIAGNOSIS — E038 Other specified hypothyroidism: Secondary | ICD-10-CM | POA: Diagnosis not present

## 2016-11-16 DIAGNOSIS — E785 Hyperlipidemia, unspecified: Secondary | ICD-10-CM | POA: Diagnosis not present

## 2016-11-16 DIAGNOSIS — D649 Anemia, unspecified: Secondary | ICD-10-CM

## 2016-11-16 DIAGNOSIS — Z992 Dependence on renal dialysis: Secondary | ICD-10-CM

## 2016-11-16 DIAGNOSIS — Z0000 Encounter for general adult medical examination without abnormal findings: Secondary | ICD-10-CM

## 2016-11-16 DIAGNOSIS — I10 Essential (primary) hypertension: Secondary | ICD-10-CM

## 2016-11-16 LAB — COMPREHENSIVE METABOLIC PANEL
ALBUMIN: 3.7 g/dL (ref 3.5–5.2)
ALT: 14 U/L (ref 0–35)
AST: 24 U/L (ref 0–37)
Alkaline Phosphatase: 113 U/L (ref 39–117)
BILIRUBIN TOTAL: 0.4 mg/dL (ref 0.2–1.2)
BUN: 38 mg/dL — AB (ref 6–23)
CHLORIDE: 98 meq/L (ref 96–112)
CO2: 27 meq/L (ref 19–32)
Calcium: 9.5 mg/dL (ref 8.4–10.5)
Creatinine, Ser: 6.67 mg/dL (ref 0.40–1.20)
GFR: 6.57 mL/min — CL (ref >60.00)
Glucose, Bld: 79 mg/dL (ref 70–99)
Potassium: 4.1 mEq/L (ref 3.5–5.1)
SODIUM: 139 meq/L (ref 135–145)
Total Protein: 7.2 g/dL (ref 6.0–8.3)

## 2016-11-16 LAB — CBC WITH DIFFERENTIAL/PLATELET
BASOS PCT: 0.7 % (ref 0.0–3.0)
Basophils Absolute: 0.1 10*3/uL (ref 0.0–0.1)
EOS PCT: 5.1 % — AB (ref 0.0–5.0)
Eosinophils Absolute: 0.4 10*3/uL (ref 0.0–0.7)
HCT: 37.5 % (ref 36.0–46.0)
Hemoglobin: 12.5 g/dL (ref 12.0–15.0)
Lymphocytes Relative: 23.6 % (ref 12.0–46.0)
Lymphs Abs: 1.9 10*3/uL (ref 0.7–4.0)
MCHC: 33.4 g/dL (ref 30.0–36.0)
MCV: 105.7 fl — AB (ref 78.0–100.0)
MONOS PCT: 10.5 % (ref 3.0–12.0)
Monocytes Absolute: 0.8 10*3/uL (ref 0.1–1.0)
NEUTROS ABS: 4.9 10*3/uL (ref 1.4–7.7)
NEUTROS PCT: 60.1 % (ref 43.0–77.0)
Platelets: 171 10*3/uL (ref 150.0–400.0)
RBC: 3.55 Mil/uL — AB (ref 3.87–5.11)
RDW: 14.5 % (ref 11.5–15.5)
WBC: 8.1 10*3/uL (ref 4.0–10.5)

## 2016-11-16 LAB — LIPID PANEL
Cholesterol: 121 mg/dL (ref 0–200)
HDL: 40.2 mg/dL (ref >39.00)
NonHDL: 80.4
Total CHOL/HDL Ratio: 3
Triglycerides: 233 mg/dL — ABNORMAL HIGH (ref 0.0–149.0)
VLDL: 46.6 mg/dL — AB (ref 0.0–40.0)

## 2016-11-16 LAB — LDL CHOLESTEROL, DIRECT: Direct LDL: 46 mg/dL

## 2016-11-16 LAB — TSH: TSH: 0.11 u[IU]/mL — ABNORMAL LOW (ref 0.35–4.50)

## 2016-11-16 LAB — T4, FREE: FREE T4: 5.84 ng/dL — AB (ref 0.60–1.60)

## 2016-11-16 NOTE — Telephone Encounter (Signed)
[facility] called critical labs, CRT - 6.67, GFR - 6.56. Results called to [name]

## 2016-11-17 DIAGNOSIS — D631 Anemia in chronic kidney disease: Secondary | ICD-10-CM | POA: Diagnosis not present

## 2016-11-17 DIAGNOSIS — D509 Iron deficiency anemia, unspecified: Secondary | ICD-10-CM | POA: Diagnosis not present

## 2016-11-17 DIAGNOSIS — N186 End stage renal disease: Secondary | ICD-10-CM | POA: Diagnosis not present

## 2016-11-17 DIAGNOSIS — Z992 Dependence on renal dialysis: Secondary | ICD-10-CM | POA: Diagnosis not present

## 2016-11-19 DIAGNOSIS — D631 Anemia in chronic kidney disease: Secondary | ICD-10-CM | POA: Diagnosis not present

## 2016-11-19 DIAGNOSIS — Z992 Dependence on renal dialysis: Secondary | ICD-10-CM | POA: Diagnosis not present

## 2016-11-19 DIAGNOSIS — D509 Iron deficiency anemia, unspecified: Secondary | ICD-10-CM | POA: Diagnosis not present

## 2016-11-19 DIAGNOSIS — N186 End stage renal disease: Secondary | ICD-10-CM | POA: Diagnosis not present

## 2016-11-21 DIAGNOSIS — N186 End stage renal disease: Secondary | ICD-10-CM | POA: Diagnosis not present

## 2016-11-21 DIAGNOSIS — Z992 Dependence on renal dialysis: Secondary | ICD-10-CM | POA: Diagnosis not present

## 2016-11-21 DIAGNOSIS — D509 Iron deficiency anemia, unspecified: Secondary | ICD-10-CM | POA: Diagnosis not present

## 2016-11-21 DIAGNOSIS — D631 Anemia in chronic kidney disease: Secondary | ICD-10-CM | POA: Diagnosis not present

## 2016-11-23 DIAGNOSIS — J9611 Chronic respiratory failure with hypoxia: Secondary | ICD-10-CM | POA: Diagnosis not present

## 2016-11-23 DIAGNOSIS — J449 Chronic obstructive pulmonary disease, unspecified: Secondary | ICD-10-CM | POA: Diagnosis not present

## 2016-11-24 DIAGNOSIS — D509 Iron deficiency anemia, unspecified: Secondary | ICD-10-CM | POA: Diagnosis not present

## 2016-11-24 DIAGNOSIS — N186 End stage renal disease: Secondary | ICD-10-CM | POA: Diagnosis not present

## 2016-11-24 DIAGNOSIS — D631 Anemia in chronic kidney disease: Secondary | ICD-10-CM | POA: Diagnosis not present

## 2016-11-24 DIAGNOSIS — Z992 Dependence on renal dialysis: Secondary | ICD-10-CM | POA: Diagnosis not present

## 2016-11-25 ENCOUNTER — Encounter: Payer: Self-pay | Admitting: Family Medicine

## 2016-11-25 ENCOUNTER — Other Ambulatory Visit: Payer: Self-pay | Admitting: Family Medicine

## 2016-11-25 ENCOUNTER — Ambulatory Visit (INDEPENDENT_AMBULATORY_CARE_PROVIDER_SITE_OTHER): Payer: Medicare Other | Admitting: Family Medicine

## 2016-11-25 VITALS — BP 126/66 | HR 85 | Temp 98.0°F | Ht 64.5 in | Wt 128.0 lb

## 2016-11-25 DIAGNOSIS — E038 Other specified hypothyroidism: Secondary | ICD-10-CM

## 2016-11-25 DIAGNOSIS — I713 Abdominal aortic aneurysm, ruptured, unspecified: Secondary | ICD-10-CM

## 2016-11-25 DIAGNOSIS — E785 Hyperlipidemia, unspecified: Secondary | ICD-10-CM

## 2016-11-25 DIAGNOSIS — D696 Thrombocytopenia, unspecified: Secondary | ICD-10-CM

## 2016-11-25 DIAGNOSIS — I10 Essential (primary) hypertension: Secondary | ICD-10-CM | POA: Diagnosis not present

## 2016-11-25 DIAGNOSIS — D510 Vitamin B12 deficiency anemia due to intrinsic factor deficiency: Secondary | ICD-10-CM

## 2016-11-25 DIAGNOSIS — S99921D Unspecified injury of right foot, subsequent encounter: Secondary | ICD-10-CM | POA: Diagnosis not present

## 2016-11-25 DIAGNOSIS — Z992 Dependence on renal dialysis: Secondary | ICD-10-CM

## 2016-11-25 DIAGNOSIS — N186 End stage renal disease: Secondary | ICD-10-CM

## 2016-11-25 MED ORDER — METOPROLOL TARTRATE 25 MG PO TABS: 25.0000 mg | ORAL_TABLET | Freq: Every day | ORAL | 0 refills | Status: AC

## 2016-11-25 MED ORDER — HYDRALAZINE HCL 25 MG PO TABS: 25.0000 mg | ORAL_TABLET | Freq: Every day | ORAL | 0 refills | Status: DC

## 2016-11-25 NOTE — Assessment & Plan Note (Signed)
Well controlled on current dose of statin. No changes made today.

## 2016-11-25 NOTE — Assessment & Plan Note (Signed)
Stable on lower dose of BP rxs. This has been adjusted by renal.

## 2016-11-25 NOTE — Assessment & Plan Note (Signed)
On HD 

## 2016-11-25 NOTE — Patient Instructions (Signed)
Great to see you.  Please schedule an appointment with Candis Musa, RN for a wellness visit.

## 2016-11-25 NOTE — Assessment & Plan Note (Signed)
On every 6 month surveillance by vascular, which is UTD.

## 2016-11-25 NOTE — Assessment & Plan Note (Signed)
Will fax results to Dr. Gabriel Carina since she is managing this- a bit overcorrected but she is asymptomatic.  She may choose to keep her on the same dosage of synthroid.
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
## 2016-11-26 DIAGNOSIS — Z992 Dependence on renal dialysis: Secondary | ICD-10-CM | POA: Diagnosis not present

## 2016-11-26 DIAGNOSIS — D631 Anemia in chronic kidney disease: Secondary | ICD-10-CM | POA: Diagnosis not present

## 2016-11-26 DIAGNOSIS — N186 End stage renal disease: Secondary | ICD-10-CM | POA: Diagnosis not present

## 2016-11-26 DIAGNOSIS — D509 Iron deficiency anemia, unspecified: Secondary | ICD-10-CM | POA: Diagnosis not present

## 2016-11-27 DIAGNOSIS — N186 End stage renal disease: Secondary | ICD-10-CM | POA: Diagnosis not present

## 2016-11-27 DIAGNOSIS — Z992 Dependence on renal dialysis: Secondary | ICD-10-CM | POA: Diagnosis not present

## 2016-11-28 DIAGNOSIS — Z992 Dependence on renal dialysis: Secondary | ICD-10-CM | POA: Diagnosis not present

## 2016-11-28 DIAGNOSIS — D509 Iron deficiency anemia, unspecified: Secondary | ICD-10-CM | POA: Diagnosis not present

## 2016-11-28 DIAGNOSIS — Z23 Encounter for immunization: Secondary | ICD-10-CM | POA: Diagnosis not present

## 2016-11-28 DIAGNOSIS — D631 Anemia in chronic kidney disease: Secondary | ICD-10-CM | POA: Diagnosis not present

## 2016-11-28 DIAGNOSIS — N186 End stage renal disease: Secondary | ICD-10-CM | POA: Diagnosis not present

## 2016-12-01 DIAGNOSIS — Z992 Dependence on renal dialysis: Secondary | ICD-10-CM | POA: Diagnosis not present

## 2016-12-01 DIAGNOSIS — D631 Anemia in chronic kidney disease: Secondary | ICD-10-CM | POA: Diagnosis not present

## 2016-12-01 DIAGNOSIS — D509 Iron deficiency anemia, unspecified: Secondary | ICD-10-CM | POA: Diagnosis not present

## 2016-12-01 DIAGNOSIS — N186 End stage renal disease: Secondary | ICD-10-CM | POA: Diagnosis not present

## 2016-12-01 DIAGNOSIS — Z23 Encounter for immunization: Secondary | ICD-10-CM | POA: Diagnosis not present

## 2016-12-02 ENCOUNTER — Other Ambulatory Visit: Payer: Self-pay | Admitting: Family Medicine

## 2016-12-03 DIAGNOSIS — Z23 Encounter for immunization: Secondary | ICD-10-CM | POA: Diagnosis not present

## 2016-12-03 DIAGNOSIS — D509 Iron deficiency anemia, unspecified: Secondary | ICD-10-CM | POA: Diagnosis not present

## 2016-12-03 DIAGNOSIS — D631 Anemia in chronic kidney disease: Secondary | ICD-10-CM | POA: Diagnosis not present

## 2016-12-03 DIAGNOSIS — Z992 Dependence on renal dialysis: Secondary | ICD-10-CM | POA: Diagnosis not present

## 2016-12-03 DIAGNOSIS — N186 End stage renal disease: Secondary | ICD-10-CM | POA: Diagnosis not present

## 2016-12-04 DIAGNOSIS — N186 End stage renal disease: Secondary | ICD-10-CM | POA: Diagnosis not present

## 2016-12-04 DIAGNOSIS — Z792 Long term (current) use of antibiotics: Secondary | ICD-10-CM | POA: Diagnosis not present

## 2016-12-04 DIAGNOSIS — T827XXD Infection and inflammatory reaction due to other cardiac and vascular devices, implants and grafts, subsequent encounter: Secondary | ICD-10-CM | POA: Diagnosis not present

## 2016-12-04 DIAGNOSIS — Z1621 Resistance to vancomycin: Secondary | ICD-10-CM | POA: Diagnosis not present

## 2016-12-04 DIAGNOSIS — A491 Streptococcal infection, unspecified site: Secondary | ICD-10-CM | POA: Diagnosis not present

## 2016-12-05 DIAGNOSIS — D509 Iron deficiency anemia, unspecified: Secondary | ICD-10-CM | POA: Diagnosis not present

## 2016-12-05 DIAGNOSIS — D631 Anemia in chronic kidney disease: Secondary | ICD-10-CM | POA: Diagnosis not present

## 2016-12-05 DIAGNOSIS — Z23 Encounter for immunization: Secondary | ICD-10-CM | POA: Diagnosis not present

## 2016-12-05 DIAGNOSIS — Z992 Dependence on renal dialysis: Secondary | ICD-10-CM | POA: Diagnosis not present

## 2016-12-05 DIAGNOSIS — N186 End stage renal disease: Secondary | ICD-10-CM | POA: Diagnosis not present

## 2016-12-07 ENCOUNTER — Other Ambulatory Visit: Payer: Self-pay | Admitting: Family Medicine

## 2016-12-08 DIAGNOSIS — Z23 Encounter for immunization: Secondary | ICD-10-CM | POA: Diagnosis not present

## 2016-12-08 DIAGNOSIS — D631 Anemia in chronic kidney disease: Secondary | ICD-10-CM | POA: Diagnosis not present

## 2016-12-08 DIAGNOSIS — N186 End stage renal disease: Secondary | ICD-10-CM | POA: Diagnosis not present

## 2016-12-08 DIAGNOSIS — D509 Iron deficiency anemia, unspecified: Secondary | ICD-10-CM | POA: Diagnosis not present

## 2016-12-08 DIAGNOSIS — Z992 Dependence on renal dialysis: Secondary | ICD-10-CM | POA: Diagnosis not present

## 2016-12-10 DIAGNOSIS — D631 Anemia in chronic kidney disease: Secondary | ICD-10-CM | POA: Diagnosis not present

## 2016-12-10 DIAGNOSIS — N186 End stage renal disease: Secondary | ICD-10-CM | POA: Diagnosis not present

## 2016-12-10 DIAGNOSIS — D509 Iron deficiency anemia, unspecified: Secondary | ICD-10-CM | POA: Diagnosis not present

## 2016-12-10 DIAGNOSIS — Z23 Encounter for immunization: Secondary | ICD-10-CM | POA: Diagnosis not present

## 2016-12-10 DIAGNOSIS — Z992 Dependence on renal dialysis: Secondary | ICD-10-CM | POA: Diagnosis not present

## 2016-12-12 DIAGNOSIS — D631 Anemia in chronic kidney disease: Secondary | ICD-10-CM | POA: Diagnosis not present

## 2016-12-12 DIAGNOSIS — Z992 Dependence on renal dialysis: Secondary | ICD-10-CM | POA: Diagnosis not present

## 2016-12-12 DIAGNOSIS — Z23 Encounter for immunization: Secondary | ICD-10-CM | POA: Diagnosis not present

## 2016-12-12 DIAGNOSIS — D509 Iron deficiency anemia, unspecified: Secondary | ICD-10-CM | POA: Diagnosis not present

## 2016-12-12 DIAGNOSIS — N186 End stage renal disease: Secondary | ICD-10-CM | POA: Diagnosis not present

## 2016-12-15 DIAGNOSIS — D509 Iron deficiency anemia, unspecified: Secondary | ICD-10-CM | POA: Diagnosis not present

## 2016-12-15 DIAGNOSIS — N186 End stage renal disease: Secondary | ICD-10-CM | POA: Diagnosis not present

## 2016-12-15 DIAGNOSIS — Z992 Dependence on renal dialysis: Secondary | ICD-10-CM | POA: Diagnosis not present

## 2016-12-15 DIAGNOSIS — Z23 Encounter for immunization: Secondary | ICD-10-CM | POA: Diagnosis not present

## 2016-12-15 DIAGNOSIS — D631 Anemia in chronic kidney disease: Secondary | ICD-10-CM | POA: Diagnosis not present

## 2016-12-16 ENCOUNTER — Encounter: Payer: Self-pay | Admitting: Certified Nurse Midwife

## 2016-12-16 ENCOUNTER — Ambulatory Visit: Payer: Medicare Other

## 2016-12-16 ENCOUNTER — Ambulatory Visit (INDEPENDENT_AMBULATORY_CARE_PROVIDER_SITE_OTHER): Payer: Medicare Other | Admitting: Certified Nurse Midwife

## 2016-12-16 ENCOUNTER — Other Ambulatory Visit (HOSPITAL_COMMUNITY)
Admission: RE | Admit: 2016-12-16 | Discharge: 2016-12-16 | Disposition: A | Discharge status: Home / Self Care | Payer: Medicare Other | Source: Ambulatory Visit | Attending: Obstetrics & Gynecology | Admitting: Obstetrics & Gynecology

## 2016-12-16 VITALS — BP 116/64 | HR 72 | Resp 16

## 2016-12-16 DIAGNOSIS — Z87898 Personal history of other specified conditions: Secondary | ICD-10-CM | POA: Diagnosis not present

## 2016-12-16 DIAGNOSIS — Z124 Encounter for screening for malignant neoplasm of cervix: Secondary | ICD-10-CM

## 2016-12-16 DIAGNOSIS — Z01419 Encounter for gynecological examination (general) (routine) without abnormal findings: Secondary | ICD-10-CM | POA: Diagnosis not present

## 2016-12-16 DIAGNOSIS — N891 Moderate vaginal dysplasia: Secondary | ICD-10-CM | POA: Diagnosis not present

## 2016-12-16 DIAGNOSIS — I729 Aneurysm of unspecified site: Secondary | ICD-10-CM | POA: Insufficient documentation

## 2016-12-16 DIAGNOSIS — Z8742 Personal history of other diseases of the female genital tract: Secondary | ICD-10-CM
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
## 2016-12-16 NOTE — Patient Instructions (Signed)

## 2016-12-17 DIAGNOSIS — D631 Anemia in chronic kidney disease: Secondary | ICD-10-CM | POA: Diagnosis not present

## 2016-12-17 DIAGNOSIS — D509 Iron deficiency anemia, unspecified: Secondary | ICD-10-CM | POA: Diagnosis not present

## 2016-12-17 DIAGNOSIS — Z23 Encounter for immunization: Secondary | ICD-10-CM | POA: Diagnosis not present

## 2016-12-17 DIAGNOSIS — N186 End stage renal disease: Secondary | ICD-10-CM | POA: Diagnosis not present

## 2016-12-17 DIAGNOSIS — Z992 Dependence on renal dialysis: Secondary | ICD-10-CM | POA: Diagnosis not present

## 2016-12-18 LAB — CYTOLOGY - PAP: Diagnosis: NEGATIVE

## 2016-12-19 DIAGNOSIS — D631 Anemia in chronic kidney disease: Secondary | ICD-10-CM | POA: Diagnosis not present

## 2016-12-19 DIAGNOSIS — D509 Iron deficiency anemia, unspecified: Secondary | ICD-10-CM | POA: Diagnosis not present

## 2016-12-19 DIAGNOSIS — Z992 Dependence on renal dialysis: Secondary | ICD-10-CM | POA: Diagnosis not present

## 2016-12-19 DIAGNOSIS — N186 End stage renal disease: Secondary | ICD-10-CM | POA: Diagnosis not present

## 2016-12-19 DIAGNOSIS — Z23 Encounter for immunization: Secondary | ICD-10-CM | POA: Diagnosis not present

## 2016-12-21 ENCOUNTER — Other Ambulatory Visit: Payer: Self-pay | Admitting: Internal Medicine

## 2016-12-21 ENCOUNTER — Other Ambulatory Visit (INDEPENDENT_AMBULATORY_CARE_PROVIDER_SITE_OTHER): Payer: Medicare Other

## 2016-12-21 ENCOUNTER — Ambulatory Visit (INDEPENDENT_AMBULATORY_CARE_PROVIDER_SITE_OTHER): Payer: Medicare Other | Admitting: Vascular Surgery

## 2016-12-21 ENCOUNTER — Ambulatory Visit
Admission: RE | Admit: 2016-12-21 | Discharge: 2016-12-21 | Disposition: A | Discharge status: Home / Self Care | Payer: Medicare Other | Source: Ambulatory Visit | Attending: Internal Medicine | Admitting: Internal Medicine

## 2016-12-21 ENCOUNTER — Encounter (INDEPENDENT_AMBULATORY_CARE_PROVIDER_SITE_OTHER): Payer: Self-pay

## 2016-12-21 DIAGNOSIS — I729 Aneurysm of unspecified site: Secondary | ICD-10-CM

## 2016-12-21 DIAGNOSIS — R059 Cough, unspecified: Secondary | ICD-10-CM

## 2016-12-21 DIAGNOSIS — Z959 Presence of cardiac and vascular implant and graft, unspecified: Secondary | ICD-10-CM | POA: Insufficient documentation

## 2016-12-21 DIAGNOSIS — R05 Cough: Secondary | ICD-10-CM

## 2016-12-21 DIAGNOSIS — T829XXS Unspecified complication of cardiac and vascular prosthetic device, implant and graft, sequela: Secondary | ICD-10-CM | POA: Diagnosis not present

## 2016-12-21 DIAGNOSIS — J449 Chronic obstructive pulmonary disease, unspecified: Secondary | ICD-10-CM | POA: Insufficient documentation

## 2016-12-21 DIAGNOSIS — E785 Hyperlipidemia, unspecified: Secondary | ICD-10-CM | POA: Diagnosis not present

## 2016-12-21 DIAGNOSIS — N186 End stage renal disease: Secondary | ICD-10-CM | POA: Diagnosis not present
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
## 2016-12-22 ENCOUNTER — Other Ambulatory Visit (INDEPENDENT_AMBULATORY_CARE_PROVIDER_SITE_OTHER): Payer: Self-pay | Admitting: Vascular Surgery

## 2016-12-22 DIAGNOSIS — Z992 Dependence on renal dialysis: Secondary | ICD-10-CM | POA: Diagnosis not present

## 2016-12-22 DIAGNOSIS — D631 Anemia in chronic kidney disease: Secondary | ICD-10-CM | POA: Diagnosis not present

## 2016-12-22 DIAGNOSIS — N186 End stage renal disease: Secondary | ICD-10-CM | POA: Diagnosis not present

## 2016-12-22 DIAGNOSIS — Z23 Encounter for immunization: Secondary | ICD-10-CM | POA: Diagnosis not present

## 2016-12-22 DIAGNOSIS — D509 Iron deficiency anemia, unspecified: Secondary | ICD-10-CM | POA: Diagnosis not present

## 2016-12-24 ENCOUNTER — Other Ambulatory Visit: Payer: Self-pay | Admitting: Family Medicine

## 2016-12-24 DIAGNOSIS — N186 End stage renal disease: Secondary | ICD-10-CM | POA: Diagnosis not present

## 2016-12-24 DIAGNOSIS — Z992 Dependence on renal dialysis: Secondary | ICD-10-CM | POA: Diagnosis not present

## 2016-12-24 DIAGNOSIS — D509 Iron deficiency anemia, unspecified: Secondary | ICD-10-CM | POA: Diagnosis not present

## 2016-12-24 DIAGNOSIS — D631 Anemia in chronic kidney disease: Secondary | ICD-10-CM | POA: Diagnosis not present

## 2016-12-24 DIAGNOSIS — Z23 Encounter for immunization: Secondary | ICD-10-CM | POA: Diagnosis not present

## 2016-12-24 MED ORDER — CEFAZOLIN SODIUM-DEXTROSE 1-4 GM/50ML-% IV SOLN: 1.0000 g | Freq: Once | INTRAVENOUS | Status: DC

## 2016-12-25 ENCOUNTER — Ambulatory Visit
Admission: RE | Admit: 2016-12-25 | Discharge: 2016-12-25 | Disposition: A | Discharge status: Home / Self Care | Payer: Medicare Other | Source: Ambulatory Visit | Attending: Vascular Surgery | Admitting: Vascular Surgery

## 2016-12-25 ENCOUNTER — Encounter
Admission: RE | Disposition: A | Discharge status: Home / Self Care | Payer: Self-pay | Source: Ambulatory Visit | Attending: Vascular Surgery

## 2016-12-25 DIAGNOSIS — Z79899 Other long term (current) drug therapy: Secondary | ICD-10-CM | POA: Diagnosis not present

## 2016-12-25 DIAGNOSIS — E1122 Type 2 diabetes mellitus with diabetic chronic kidney disease: Secondary | ICD-10-CM | POA: Diagnosis not present

## 2016-12-25 DIAGNOSIS — I5030 Unspecified diastolic (congestive) heart failure: Secondary | ICD-10-CM | POA: Insufficient documentation

## 2016-12-25 DIAGNOSIS — I714 Abdominal aortic aneurysm, without rupture: Secondary | ICD-10-CM | POA: Diagnosis not present

## 2016-12-25 DIAGNOSIS — Z9981 Dependence on supplemental oxygen: Secondary | ICD-10-CM | POA: Diagnosis not present

## 2016-12-25 DIAGNOSIS — T82858A Stenosis of vascular prosthetic devices, implants and grafts, initial encounter: Secondary | ICD-10-CM | POA: Diagnosis not present

## 2016-12-25 DIAGNOSIS — I132 Hypertensive heart and chronic kidney disease with heart failure and with stage 5 chronic kidney disease, or end stage renal disease: Secondary | ICD-10-CM | POA: Insufficient documentation

## 2016-12-25 DIAGNOSIS — D649 Anemia, unspecified: Secondary | ICD-10-CM | POA: Insufficient documentation

## 2016-12-25 DIAGNOSIS — R011 Cardiac murmur, unspecified: Secondary | ICD-10-CM | POA: Insufficient documentation

## 2016-12-25 DIAGNOSIS — K219 Gastro-esophageal reflux disease without esophagitis: Secondary | ICD-10-CM | POA: Insufficient documentation

## 2016-12-25 DIAGNOSIS — E039 Hypothyroidism, unspecified: Secondary | ICD-10-CM | POA: Insufficient documentation

## 2016-12-25 DIAGNOSIS — N186 End stage renal disease: Secondary | ICD-10-CM | POA: Diagnosis not present

## 2016-12-25 DIAGNOSIS — F419 Anxiety disorder, unspecified: Secondary | ICD-10-CM | POA: Insufficient documentation

## 2016-12-25 DIAGNOSIS — E785 Hyperlipidemia, unspecified: Secondary | ICD-10-CM | POA: Diagnosis not present

## 2016-12-25 DIAGNOSIS — Z992 Dependence on renal dialysis: Secondary | ICD-10-CM | POA: Insufficient documentation

## 2016-12-25 DIAGNOSIS — E538 Deficiency of other specified B group vitamins: Secondary | ICD-10-CM | POA: Insufficient documentation

## 2016-12-25 DIAGNOSIS — Z85828 Personal history of other malignant neoplasm of skin: Secondary | ICD-10-CM | POA: Diagnosis not present

## 2016-12-25 DIAGNOSIS — I770 Arteriovenous fistula, acquired: Secondary | ICD-10-CM | POA: Diagnosis present

## 2016-12-25 DIAGNOSIS — T82868A Thrombosis of vascular prosthetic devices, implants and grafts, initial encounter: Secondary | ICD-10-CM | POA: Diagnosis not present

## 2016-12-25 HISTORY — PX: A/V FISTULAGRAM: CATH118298

## 2016-12-25 LAB — POTASSIUM (ARMC VASCULAR LAB ONLY): POTASSIUM (ARMC VASCULAR LAB): 3.4 — AB (ref 3.5–5.1)

## 2016-12-25 SURGERY — A/V FISTULAGRAM
Anesthesia: Moderate Sedation | Laterality: Left

## 2016-12-25 MED ORDER — CEFAZOLIN SODIUM-DEXTROSE 1-4 GM/50ML-% IV SOLN
INTRAVENOUS | Status: AC
  Filled 2016-12-25: qty 50

## 2016-12-25 MED ORDER — HYDROMORPHONE HCL 1 MG/ML IJ SOLN: 1.0000 mg | Freq: Once | INTRAMUSCULAR | Status: DC | PRN

## 2016-12-25 MED ORDER — ONDANSETRON HCL 4 MG/2ML IJ SOLN: 4.0000 mg | Freq: Four times a day (QID) | INTRAMUSCULAR | Status: DC | PRN

## 2016-12-25 MED ORDER — SODIUM CHLORIDE 0.9 % IV SOLN
INTRAVENOUS | Status: DC
  Administered 2016-12-25: 09:00:00 via INTRAVENOUS

## 2016-12-25 MED ORDER — LIDOCAINE HCL (PF) 1 % IJ SOLN
INTRAMUSCULAR | Status: AC
  Filled 2016-12-25: qty 30

## 2016-12-25 MED ORDER — FAMOTIDINE 20 MG PO TABS: 40.0000 mg | ORAL_TABLET | ORAL | Status: DC | PRN

## 2016-12-25 MED ORDER — FENTANYL CITRATE (PF) 100 MCG/2ML IJ SOLN
INTRAMUSCULAR | Status: AC
  Filled 2016-12-25: qty 2

## 2016-12-25 MED ORDER — METHYLPREDNISOLONE SODIUM SUCC 125 MG IJ SOLR: 125.0000 mg | INTRAMUSCULAR | Status: DC | PRN

## 2016-12-25 MED ORDER — IOPAMIDOL (ISOVUE-300) INJECTION 61%
INTRAVENOUS | Status: DC | PRN
  Administered 2016-12-25: 25 mL via INTRAVENOUS

## 2016-12-25 MED ORDER — MIDAZOLAM HCL 5 MG/5ML IJ SOLN
INTRAMUSCULAR | Status: AC
  Filled 2016-12-25: qty 5

## 2016-12-25 MED ORDER — FENTANYL CITRATE (PF) 100 MCG/2ML IJ SOLN
INTRAMUSCULAR | Status: DC | PRN
  Administered 2016-12-25: 50 ug via INTRAVENOUS
  Administered 2016-12-25: 25 ug via INTRAVENOUS

## 2016-12-25 MED ORDER — MIDAZOLAM HCL 2 MG/2ML IJ SOLN
INTRAMUSCULAR | Status: DC | PRN
  Administered 2016-12-25: 1 mg via INTRAVENOUS
  Administered 2016-12-25: 2 mg via INTRAVENOUS

## 2016-12-25 SURGICAL SUPPLY — 18 items
BALLN DORADO 7X40X80 (BALLOONS) ×3
BALLN LUTONIX AV 9X60X75 (BALLOONS) ×3
BALLOON DORADO 7X40X80 (BALLOONS) IMPLANT
BALLOON LUTONIX AV 9X60X75 (BALLOONS) IMPLANT
CATH BEACON 5 .035 65 KMP TIP (CATHETERS) ×2 IMPLANT
COVER PROBE U/S 5X48 (MISCELLANEOUS) ×2 IMPLANT
DEVICE PRESTO INFLATION (MISCELLANEOUS) ×2 IMPLANT
DRAPE BRACHIAL (DRAPES) ×2 IMPLANT
NDL ENTRY 21GA 7CM ECHOTIP (NEEDLE) IMPLANT
NEEDLE ENTRY 21GA 7CM ECHOTIP (NEEDLE) ×3 IMPLANT
PACK ANGIOGRAPHY (CUSTOM PROCEDURE TRAY) ×3 IMPLANT
SET INTRO CAPELLA COAXIAL (SET/KITS/TRAYS/PACK) ×2 IMPLANT
SHEATH BRITE TIP 6FRX5.5 (SHEATH) ×2 IMPLANT
SHIELD RADPAD SCOOP 12X17 (MISCELLANEOUS) ×2 IMPLANT
SUT MNCRL 4-0 (SUTURE) ×3
SUT MNCRL 4-0 27XMFL (SUTURE) ×1
SUTURE MNCRL 4-0 27XMF (SUTURE) IMPLANT
WIRE MAGIC TOR.035 180C (WIRE) ×2 IMPLANT

## 2016-12-25 NOTE — Op Note (Signed)
OPERATIVE NOTE   PROCEDURE: 1. Contrast injection left brachial axillary  AV access 2. Percutaneous transluminal angioplasty peripheral segment to 7 mm 3. Percutaneous transluminal angioplasty central venous segment to 10 mm with Lutonix drug eluting balloon  PRE-OPERATIVE DIAGNOSIS: Complication of dialysis access                                                       End Stage Renal Disease  POST-OPERATIVE DIAGNOSIS: same as above   SURGEON: Katha Cabal, M.D.  ANESTHESIA: Conscious sedation was administered under my direct supervision by the interventional radiology RN. IV Versed plus fentanyl were utilized. Continuous ECG, pulse oximetry and blood pressure was monitored throughout the entire procedure.  Conscious sedation was for a total of 42.  ESTIMATED BLOOD LOSS: minimal  FINDING(S): Stricture of the AV graft within the peripheral segment as well as a stricture within the central venous portion  SPECIMEN(S):  None  CONTRAST: 40 cc  FLUOROSCOPY TIME: 4.7 minutes  INDICATIONS: [name] is a 66 y.o. female who  presents with malfunctioning left arm AV access.  The patient is scheduled for angiography with possible intervention of the AV access.  The patient is aware the risks include but are not limited to: bleeding, infection, thrombosis of the cannulated access, and possible anaphylactic reaction to the contrast.  The patient acknowledges if the access can not be salvaged a tunneled catheter will be needed and will be placed during this procedure.  The patient is aware of the risks of the procedure and elects to proceed with the angiogram and intervention.  DESCRIPTION: After full informed written consent was obtained, the patient was brought back to the Special Procedure suite and placed supine position.  Appropriate cardiopulmonary monitors were placed.  The left arm was prepped and draped in the standard fashion.  Appropriate timeout is called. The left AV  graft  was cannulated with a micropuncture needle.  Cannulation was performed with ultrasound guidance. Ultrasound was placed in a sterile sleeve, the AV access was interrogated and noted to be echolucent and compressible indicating patency. Image was recorded for the permanent record. The puncture is performed under continuous ultrasound visualization.   The microwire was advanced and the needle was exchanged for  a microsheath.  The J-wire was then advanced and a 6 Fr sheath inserted.  Hand injections were completed to image the access from the arterial anastomosis through the entire access.  The central venous structures were also imaged by hand injections.  Based on the images,  There is an 85% subclavian stricture and a 70% stenosis in the graft at the leading edge of the previously placed stent.  No heparin was given as she has HIT.  A wire was negotiated through the strictures within the venous portion of the graft as well as the central stenosis.   An 7 mm x 60 mm [place] balloon was used.  Inflation was to 24 atm.  The detector was then repositioned over the peripheral portion of the AV access and the same balloon  was used to treat the stricture within the AV access. Inflation was to 16 atm for one minute.  The detector was then repositioned over the central lesion once again and a 10 mm x 60 mm Lutonix drug eluting balloon was used to treat the central  stricture. Inflation was to 14 atm for one minute.   Follow-up imaging demonstrates significant improvement with a marked reduction of the strictures with <5% residual stenosis at both locations.  There is now rapid flow of contrast through the graft and the central veins.   A 4-0 Monocryl purse-string suture was sewn around the sheath.  The sheath was removed and light pressure was applied.  A sterile bandage was applied to the puncture site.    COMPLICATIONS: None  CONDITION: Good  Gregory G. Schnier, M.D [facility] Office: [phone]  12/25/2016 12:55 PM    

## 2016-12-25 NOTE — H&P (Signed)
[facility] History & Physical Update  The patient was interviewed and re-examined.  The patient's previous History and Physical has been reviewed and is unchanged.  There is no change in the plan of care. We plan to proceed with the scheduled procedure.  Hortencia Pilar, MD  12/25/2016, 11:48 AM

## 2016-12-26 DIAGNOSIS — D631 Anemia in chronic kidney disease: Secondary | ICD-10-CM | POA: Diagnosis not present

## 2016-12-26 DIAGNOSIS — N186 End stage renal disease: Secondary | ICD-10-CM | POA: Diagnosis not present

## 2016-12-26 DIAGNOSIS — Z23 Encounter for immunization: Secondary | ICD-10-CM | POA: Diagnosis not present

## 2016-12-26 DIAGNOSIS — D509 Iron deficiency anemia, unspecified: Secondary | ICD-10-CM | POA: Diagnosis not present

## 2016-12-26 DIAGNOSIS — Z992 Dependence on renal dialysis: Secondary | ICD-10-CM | POA: Diagnosis not present

## 2016-12-27 DIAGNOSIS — Z992 Dependence on renal dialysis: Secondary | ICD-10-CM | POA: Diagnosis not present

## 2016-12-27 DIAGNOSIS — N186 End stage renal disease: Secondary | ICD-10-CM | POA: Diagnosis not present

## 2016-12-28 DIAGNOSIS — L570 Actinic keratosis: Secondary | ICD-10-CM | POA: Diagnosis not present

## 2016-12-28 DIAGNOSIS — D225 Melanocytic nevi of trunk: Secondary | ICD-10-CM | POA: Diagnosis not present

## 2016-12-28 DIAGNOSIS — X32XXXA Exposure to sunlight, initial encounter: Secondary | ICD-10-CM | POA: Diagnosis not present

## 2016-12-28 DIAGNOSIS — D2272 Melanocytic nevi of left lower limb, including hip: Secondary | ICD-10-CM | POA: Diagnosis not present

## 2016-12-28 DIAGNOSIS — Z85828 Personal history of other malignant neoplasm of skin: Secondary | ICD-10-CM | POA: Diagnosis not present

## 2016-12-28 DIAGNOSIS — L309 Dermatitis, unspecified: Secondary | ICD-10-CM | POA: Diagnosis not present

## 2016-12-29 DIAGNOSIS — N186 End stage renal disease: Secondary | ICD-10-CM | POA: Diagnosis not present

## 2016-12-29 DIAGNOSIS — D631 Anemia in chronic kidney disease: Secondary | ICD-10-CM | POA: Diagnosis not present

## 2016-12-29 DIAGNOSIS — D509 Iron deficiency anemia, unspecified: Secondary | ICD-10-CM | POA: Diagnosis not present

## 2016-12-29 DIAGNOSIS — Z992 Dependence on renal dialysis: Secondary | ICD-10-CM | POA: Diagnosis not present

## 2016-12-29 DIAGNOSIS — N2581 Secondary hyperparathyroidism of renal origin: Secondary | ICD-10-CM | POA: Diagnosis not present

## 2016-12-31 DIAGNOSIS — N2581 Secondary hyperparathyroidism of renal origin: Secondary | ICD-10-CM | POA: Diagnosis not present

## 2016-12-31 DIAGNOSIS — Z992 Dependence on renal dialysis: Secondary | ICD-10-CM | POA: Diagnosis not present

## 2016-12-31 DIAGNOSIS — N186 End stage renal disease: Secondary | ICD-10-CM | POA: Diagnosis not present

## 2016-12-31 DIAGNOSIS — D631 Anemia in chronic kidney disease: Secondary | ICD-10-CM | POA: Diagnosis not present

## 2016-12-31 DIAGNOSIS — D509 Iron deficiency anemia, unspecified: Secondary | ICD-10-CM | POA: Diagnosis not present

## 2017-01-02 DIAGNOSIS — N2581 Secondary hyperparathyroidism of renal origin: Secondary | ICD-10-CM | POA: Diagnosis not present

## 2017-01-02 DIAGNOSIS — N186 End stage renal disease: Secondary | ICD-10-CM | POA: Diagnosis not present

## 2017-01-02 DIAGNOSIS — Z992 Dependence on renal dialysis: Secondary | ICD-10-CM | POA: Diagnosis not present

## 2017-01-02 DIAGNOSIS — D509 Iron deficiency anemia, unspecified: Secondary | ICD-10-CM | POA: Diagnosis not present

## 2017-01-02 DIAGNOSIS — D631 Anemia in chronic kidney disease: Secondary | ICD-10-CM | POA: Diagnosis not present

## 2017-01-05 DIAGNOSIS — D631 Anemia in chronic kidney disease: Secondary | ICD-10-CM | POA: Diagnosis not present

## 2017-01-05 DIAGNOSIS — N186 End stage renal disease: Secondary | ICD-10-CM | POA: Diagnosis not present

## 2017-01-05 DIAGNOSIS — N2581 Secondary hyperparathyroidism of renal origin: Secondary | ICD-10-CM | POA: Diagnosis not present

## 2017-01-05 DIAGNOSIS — D509 Iron deficiency anemia, unspecified: Secondary | ICD-10-CM | POA: Diagnosis not present

## 2017-01-05 DIAGNOSIS — Z992 Dependence on renal dialysis: Secondary | ICD-10-CM | POA: Diagnosis not present

## 2017-01-07 DIAGNOSIS — D509 Iron deficiency anemia, unspecified: Secondary | ICD-10-CM | POA: Diagnosis not present

## 2017-01-07 DIAGNOSIS — Z992 Dependence on renal dialysis: Secondary | ICD-10-CM | POA: Diagnosis not present

## 2017-01-07 DIAGNOSIS — N2581 Secondary hyperparathyroidism of renal origin: Secondary | ICD-10-CM | POA: Diagnosis not present

## 2017-01-07 DIAGNOSIS — D631 Anemia in chronic kidney disease: Secondary | ICD-10-CM | POA: Diagnosis not present

## 2017-01-07 DIAGNOSIS — N186 End stage renal disease: Secondary | ICD-10-CM | POA: Diagnosis not present

## 2017-01-09 DIAGNOSIS — N186 End stage renal disease: Secondary | ICD-10-CM | POA: Diagnosis not present

## 2017-01-09 DIAGNOSIS — N2581 Secondary hyperparathyroidism of renal origin: Secondary | ICD-10-CM | POA: Diagnosis not present

## 2017-01-09 DIAGNOSIS — D631 Anemia in chronic kidney disease: Secondary | ICD-10-CM | POA: Diagnosis not present

## 2017-01-09 DIAGNOSIS — Z992 Dependence on renal dialysis: Secondary | ICD-10-CM | POA: Diagnosis not present

## 2017-01-09 DIAGNOSIS — D509 Iron deficiency anemia, unspecified: Secondary | ICD-10-CM | POA: Diagnosis not present

## 2017-01-12 DIAGNOSIS — Z992 Dependence on renal dialysis: Secondary | ICD-10-CM | POA: Diagnosis not present

## 2017-01-12 DIAGNOSIS — D509 Iron deficiency anemia, unspecified: Secondary | ICD-10-CM | POA: Diagnosis not present

## 2017-01-12 DIAGNOSIS — D631 Anemia in chronic kidney disease: Secondary | ICD-10-CM | POA: Diagnosis not present

## 2017-01-12 DIAGNOSIS — N2581 Secondary hyperparathyroidism of renal origin: Secondary | ICD-10-CM | POA: Diagnosis not present

## 2017-01-12 DIAGNOSIS — N186 End stage renal disease: Secondary | ICD-10-CM | POA: Diagnosis not present

## 2017-01-14 DIAGNOSIS — D509 Iron deficiency anemia, unspecified: Secondary | ICD-10-CM | POA: Diagnosis not present

## 2017-01-14 DIAGNOSIS — N186 End stage renal disease: Secondary | ICD-10-CM | POA: Diagnosis not present

## 2017-01-14 DIAGNOSIS — N2581 Secondary hyperparathyroidism of renal origin: Secondary | ICD-10-CM | POA: Diagnosis not present

## 2017-01-14 DIAGNOSIS — Z992 Dependence on renal dialysis: Secondary | ICD-10-CM | POA: Diagnosis not present

## 2017-01-14 DIAGNOSIS — D631 Anemia in chronic kidney disease: Secondary | ICD-10-CM | POA: Diagnosis not present

## 2017-01-15 DIAGNOSIS — H2513 Age-related nuclear cataract, bilateral: Secondary | ICD-10-CM | POA: Diagnosis not present

## 2017-01-16 DIAGNOSIS — N2581 Secondary hyperparathyroidism of renal origin: Secondary | ICD-10-CM | POA: Diagnosis not present

## 2017-01-16 DIAGNOSIS — Z992 Dependence on renal dialysis: Secondary | ICD-10-CM | POA: Diagnosis not present

## 2017-01-16 DIAGNOSIS — N186 End stage renal disease: Secondary | ICD-10-CM | POA: Diagnosis not present

## 2017-01-16 DIAGNOSIS — D509 Iron deficiency anemia, unspecified: Secondary | ICD-10-CM | POA: Diagnosis not present

## 2017-01-16 DIAGNOSIS — D631 Anemia in chronic kidney disease: Secondary | ICD-10-CM | POA: Diagnosis not present

## 2017-01-19 DIAGNOSIS — D509 Iron deficiency anemia, unspecified: Secondary | ICD-10-CM | POA: Diagnosis not present

## 2017-01-19 DIAGNOSIS — N2581 Secondary hyperparathyroidism of renal origin: Secondary | ICD-10-CM | POA: Diagnosis not present

## 2017-01-19 DIAGNOSIS — N186 End stage renal disease: Secondary | ICD-10-CM | POA: Diagnosis not present

## 2017-01-19 DIAGNOSIS — D631 Anemia in chronic kidney disease: Secondary | ICD-10-CM | POA: Diagnosis not present

## 2017-01-19 DIAGNOSIS — Z992 Dependence on renal dialysis: Secondary | ICD-10-CM | POA: Diagnosis not present

## 2017-01-21 DIAGNOSIS — D631 Anemia in chronic kidney disease: Secondary | ICD-10-CM | POA: Diagnosis not present

## 2017-01-21 DIAGNOSIS — N2581 Secondary hyperparathyroidism of renal origin: Secondary | ICD-10-CM | POA: Diagnosis not present

## 2017-01-21 DIAGNOSIS — D509 Iron deficiency anemia, unspecified: Secondary | ICD-10-CM | POA: Diagnosis not present

## 2017-01-21 DIAGNOSIS — N186 End stage renal disease: Secondary | ICD-10-CM | POA: Diagnosis not present

## 2017-01-21 DIAGNOSIS — Z992 Dependence on renal dialysis: Secondary | ICD-10-CM | POA: Diagnosis not present

## 2017-01-23 DIAGNOSIS — N2581 Secondary hyperparathyroidism of renal origin: Secondary | ICD-10-CM | POA: Diagnosis not present

## 2017-01-23 DIAGNOSIS — Z992 Dependence on renal dialysis: Secondary | ICD-10-CM | POA: Diagnosis not present

## 2017-01-23 DIAGNOSIS — D631 Anemia in chronic kidney disease: Secondary | ICD-10-CM | POA: Diagnosis not present

## 2017-01-23 DIAGNOSIS — D509 Iron deficiency anemia, unspecified: Secondary | ICD-10-CM | POA: Diagnosis not present

## 2017-01-23 DIAGNOSIS — N186 End stage renal disease: Secondary | ICD-10-CM | POA: Diagnosis not present

## 2017-01-26 DIAGNOSIS — N2581 Secondary hyperparathyroidism of renal origin: Secondary | ICD-10-CM | POA: Diagnosis not present

## 2017-01-26 DIAGNOSIS — N186 End stage renal disease: Secondary | ICD-10-CM | POA: Diagnosis not present

## 2017-01-26 DIAGNOSIS — D631 Anemia in chronic kidney disease: Secondary | ICD-10-CM | POA: Diagnosis not present

## 2017-01-26 DIAGNOSIS — D509 Iron deficiency anemia, unspecified: Secondary | ICD-10-CM | POA: Diagnosis not present

## 2017-01-26 DIAGNOSIS — Z992 Dependence on renal dialysis: Secondary | ICD-10-CM | POA: Diagnosis not present

## 2017-01-27 DIAGNOSIS — Z992 Dependence on renal dialysis: Secondary | ICD-10-CM | POA: Diagnosis not present

## 2017-01-27 DIAGNOSIS — N186 End stage renal disease: Secondary | ICD-10-CM | POA: Diagnosis not present
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
## 2017-01-28 ENCOUNTER — Encounter (INDEPENDENT_AMBULATORY_CARE_PROVIDER_SITE_OTHER): Payer: Self-pay | Admitting: Vascular Surgery

## 2017-01-28 ENCOUNTER — Other Ambulatory Visit (INDEPENDENT_AMBULATORY_CARE_PROVIDER_SITE_OTHER): Payer: Medicare Other

## 2017-01-28 ENCOUNTER — Ambulatory Visit (INDEPENDENT_AMBULATORY_CARE_PROVIDER_SITE_OTHER): Payer: Medicare Other | Admitting: Vascular Surgery

## 2017-01-28 ENCOUNTER — Other Ambulatory Visit (INDEPENDENT_AMBULATORY_CARE_PROVIDER_SITE_OTHER): Payer: Self-pay | Admitting: Vascular Surgery

## 2017-01-28 VITALS — BP 139/61 | HR 74 | Resp 16 | Ht 64.0 in | Wt 128.0 lb

## 2017-01-28 DIAGNOSIS — I739 Peripheral vascular disease, unspecified: Secondary | ICD-10-CM

## 2017-01-28 DIAGNOSIS — T829XXD Unspecified complication of cardiac and vascular prosthetic device, implant and graft, subsequent encounter: Secondary | ICD-10-CM

## 2017-01-28 DIAGNOSIS — J438 Other emphysema: Secondary | ICD-10-CM

## 2017-01-28 DIAGNOSIS — D631 Anemia in chronic kidney disease: Secondary | ICD-10-CM | POA: Diagnosis not present

## 2017-01-28 DIAGNOSIS — I713 Abdominal aortic aneurysm, ruptured, unspecified: Secondary | ICD-10-CM

## 2017-01-28 DIAGNOSIS — N186 End stage renal disease: Secondary | ICD-10-CM

## 2017-01-28 DIAGNOSIS — N2581 Secondary hyperparathyroidism of renal origin: Secondary | ICD-10-CM | POA: Diagnosis not present

## 2017-01-28 DIAGNOSIS — T829XXS Unspecified complication of cardiac and vascular prosthetic device, implant and graft, sequela: Secondary | ICD-10-CM | POA: Diagnosis not present

## 2017-01-28 DIAGNOSIS — I10 Essential (primary) hypertension: Secondary | ICD-10-CM

## 2017-01-28 DIAGNOSIS — Z992 Dependence on renal dialysis: Secondary | ICD-10-CM | POA: Diagnosis not present

## 2017-01-28 DIAGNOSIS — K219 Gastro-esophageal reflux disease without esophagitis: Secondary | ICD-10-CM | POA: Diagnosis not present

## 2017-01-28 DIAGNOSIS — D509 Iron deficiency anemia, unspecified: Secondary | ICD-10-CM | POA: Diagnosis not present

## 2017-01-28 DIAGNOSIS — I7025 Atherosclerosis of native arteries of other extremities with ulceration: Secondary | ICD-10-CM | POA: Insufficient documentation

## 2017-01-30 DIAGNOSIS — D631 Anemia in chronic kidney disease: Secondary | ICD-10-CM | POA: Diagnosis not present

## 2017-01-30 DIAGNOSIS — N186 End stage renal disease: Secondary | ICD-10-CM | POA: Diagnosis not present

## 2017-01-30 DIAGNOSIS — D509 Iron deficiency anemia, unspecified: Secondary | ICD-10-CM | POA: Diagnosis not present

## 2017-01-30 DIAGNOSIS — Z992 Dependence on renal dialysis: Secondary | ICD-10-CM | POA: Diagnosis not present

## 2017-01-30 DIAGNOSIS — N2581 Secondary hyperparathyroidism of renal origin: Secondary | ICD-10-CM | POA: Diagnosis not present

## 2017-02-02 DIAGNOSIS — Z992 Dependence on renal dialysis: Secondary | ICD-10-CM | POA: Diagnosis not present

## 2017-02-02 DIAGNOSIS — D631 Anemia in chronic kidney disease: Secondary | ICD-10-CM | POA: Diagnosis not present

## 2017-02-02 DIAGNOSIS — N2581 Secondary hyperparathyroidism of renal origin: Secondary | ICD-10-CM | POA: Diagnosis not present

## 2017-02-02 DIAGNOSIS — N186 End stage renal disease: Secondary | ICD-10-CM | POA: Diagnosis not present

## 2017-02-02 DIAGNOSIS — D509 Iron deficiency anemia, unspecified: Secondary | ICD-10-CM | POA: Diagnosis not present

## 2017-02-04 DIAGNOSIS — N2581 Secondary hyperparathyroidism of renal origin: Secondary | ICD-10-CM | POA: Diagnosis not present

## 2017-02-04 DIAGNOSIS — D509 Iron deficiency anemia, unspecified: Secondary | ICD-10-CM | POA: Diagnosis not present

## 2017-02-04 DIAGNOSIS — Z992 Dependence on renal dialysis: Secondary | ICD-10-CM | POA: Diagnosis not present

## 2017-02-04 DIAGNOSIS — D631 Anemia in chronic kidney disease: Secondary | ICD-10-CM | POA: Diagnosis not present

## 2017-02-04 DIAGNOSIS — N186 End stage renal disease: Secondary | ICD-10-CM | POA: Diagnosis not present

## 2017-02-06 DIAGNOSIS — D631 Anemia in chronic kidney disease: Secondary | ICD-10-CM | POA: Diagnosis not present

## 2017-02-06 DIAGNOSIS — N2581 Secondary hyperparathyroidism of renal origin: Secondary | ICD-10-CM | POA: Diagnosis not present

## 2017-02-06 DIAGNOSIS — Z992 Dependence on renal dialysis: Secondary | ICD-10-CM | POA: Diagnosis not present

## 2017-02-06 DIAGNOSIS — N186 End stage renal disease: Secondary | ICD-10-CM | POA: Diagnosis not present

## 2017-02-06 DIAGNOSIS — D509 Iron deficiency anemia, unspecified: Secondary | ICD-10-CM | POA: Diagnosis not present

## 2017-02-09 DIAGNOSIS — N2581 Secondary hyperparathyroidism of renal origin: Secondary | ICD-10-CM | POA: Diagnosis not present

## 2017-02-09 DIAGNOSIS — N186 End stage renal disease: Secondary | ICD-10-CM | POA: Diagnosis not present

## 2017-02-09 DIAGNOSIS — Z992 Dependence on renal dialysis: Secondary | ICD-10-CM | POA: Diagnosis not present

## 2017-02-09 DIAGNOSIS — D509 Iron deficiency anemia, unspecified: Secondary | ICD-10-CM | POA: Diagnosis not present

## 2017-02-09 DIAGNOSIS — D631 Anemia in chronic kidney disease: Secondary | ICD-10-CM | POA: Diagnosis not present

## 2017-02-11 DIAGNOSIS — N2581 Secondary hyperparathyroidism of renal origin: Secondary | ICD-10-CM | POA: Diagnosis not present

## 2017-02-11 DIAGNOSIS — N186 End stage renal disease: Secondary | ICD-10-CM | POA: Diagnosis not present

## 2017-02-11 DIAGNOSIS — D631 Anemia in chronic kidney disease: Secondary | ICD-10-CM | POA: Diagnosis not present

## 2017-02-11 DIAGNOSIS — D509 Iron deficiency anemia, unspecified: Secondary | ICD-10-CM | POA: Diagnosis not present

## 2017-02-11 DIAGNOSIS — Z992 Dependence on renal dialysis: Secondary | ICD-10-CM | POA: Diagnosis not present

## 2017-02-13 DIAGNOSIS — Z992 Dependence on renal dialysis: Secondary | ICD-10-CM | POA: Diagnosis not present

## 2017-02-13 DIAGNOSIS — D509 Iron deficiency anemia, unspecified: Secondary | ICD-10-CM | POA: Diagnosis not present

## 2017-02-13 DIAGNOSIS — N186 End stage renal disease: Secondary | ICD-10-CM | POA: Diagnosis not present

## 2017-02-13 DIAGNOSIS — N2581 Secondary hyperparathyroidism of renal origin: Secondary | ICD-10-CM | POA: Diagnosis not present

## 2017-02-13 DIAGNOSIS — D631 Anemia in chronic kidney disease: Secondary | ICD-10-CM | POA: Diagnosis not present

## 2017-02-15 DIAGNOSIS — D509 Iron deficiency anemia, unspecified: Secondary | ICD-10-CM | POA: Diagnosis not present

## 2017-02-15 DIAGNOSIS — N186 End stage renal disease: Secondary | ICD-10-CM | POA: Diagnosis not present

## 2017-02-15 DIAGNOSIS — N2581 Secondary hyperparathyroidism of renal origin: Secondary | ICD-10-CM | POA: Diagnosis not present

## 2017-02-15 DIAGNOSIS — Z992 Dependence on renal dialysis: Secondary | ICD-10-CM | POA: Diagnosis not present

## 2017-02-15 DIAGNOSIS — D631 Anemia in chronic kidney disease: Secondary | ICD-10-CM | POA: Diagnosis not present

## 2017-02-17 DIAGNOSIS — N2581 Secondary hyperparathyroidism of renal origin: Secondary | ICD-10-CM | POA: Diagnosis not present

## 2017-02-17 DIAGNOSIS — D631 Anemia in chronic kidney disease: Secondary | ICD-10-CM | POA: Diagnosis not present

## 2017-02-17 DIAGNOSIS — D509 Iron deficiency anemia, unspecified: Secondary | ICD-10-CM | POA: Diagnosis not present

## 2017-02-17 DIAGNOSIS — Z992 Dependence on renal dialysis: Secondary | ICD-10-CM | POA: Diagnosis not present

## 2017-02-17 DIAGNOSIS — N186 End stage renal disease: Secondary | ICD-10-CM | POA: Diagnosis not present

## 2017-02-20 DIAGNOSIS — N2581 Secondary hyperparathyroidism of renal origin: Secondary | ICD-10-CM | POA: Diagnosis not present

## 2017-02-20 DIAGNOSIS — N186 End stage renal disease: Secondary | ICD-10-CM | POA: Diagnosis not present

## 2017-02-20 DIAGNOSIS — D631 Anemia in chronic kidney disease: Secondary | ICD-10-CM | POA: Diagnosis not present

## 2017-02-20 DIAGNOSIS — Z992 Dependence on renal dialysis: Secondary | ICD-10-CM | POA: Diagnosis not present

## 2017-02-20 DIAGNOSIS — D509 Iron deficiency anemia, unspecified: Secondary | ICD-10-CM | POA: Diagnosis not present

## 2017-02-23 DIAGNOSIS — D631 Anemia in chronic kidney disease: Secondary | ICD-10-CM | POA: Diagnosis not present

## 2017-02-23 DIAGNOSIS — D509 Iron deficiency anemia, unspecified: Secondary | ICD-10-CM | POA: Diagnosis not present

## 2017-02-23 DIAGNOSIS — Z992 Dependence on renal dialysis: Secondary | ICD-10-CM | POA: Diagnosis not present

## 2017-02-23 DIAGNOSIS — N186 End stage renal disease: Secondary | ICD-10-CM | POA: Diagnosis not present

## 2017-02-23 DIAGNOSIS — N2581 Secondary hyperparathyroidism of renal origin: Secondary | ICD-10-CM | POA: Diagnosis not present

## 2017-02-25 DIAGNOSIS — N2581 Secondary hyperparathyroidism of renal origin: Secondary | ICD-10-CM | POA: Diagnosis not present

## 2017-02-25 DIAGNOSIS — D509 Iron deficiency anemia, unspecified: Secondary | ICD-10-CM | POA: Diagnosis not present

## 2017-02-25 DIAGNOSIS — D631 Anemia in chronic kidney disease: Secondary | ICD-10-CM | POA: Diagnosis not present

## 2017-02-25 DIAGNOSIS — N186 End stage renal disease: Secondary | ICD-10-CM | POA: Diagnosis not present

## 2017-02-25 DIAGNOSIS — Z992 Dependence on renal dialysis: Secondary | ICD-10-CM | POA: Diagnosis not present

## 2017-02-26 DIAGNOSIS — N186 End stage renal disease: Secondary | ICD-10-CM | POA: Diagnosis not present

## 2017-02-26 DIAGNOSIS — Z992 Dependence on renal dialysis: Secondary | ICD-10-CM | POA: Diagnosis not present

## 2017-02-27 DIAGNOSIS — N2581 Secondary hyperparathyroidism of renal origin: Secondary | ICD-10-CM | POA: Diagnosis not present

## 2017-02-27 DIAGNOSIS — D509 Iron deficiency anemia, unspecified: Secondary | ICD-10-CM | POA: Diagnosis not present

## 2017-02-27 DIAGNOSIS — D631 Anemia in chronic kidney disease: Secondary | ICD-10-CM | POA: Diagnosis not present

## 2017-02-27 DIAGNOSIS — N186 End stage renal disease: Secondary | ICD-10-CM | POA: Diagnosis not present

## 2017-02-27 DIAGNOSIS — Z992 Dependence on renal dialysis: Secondary | ICD-10-CM | POA: Diagnosis not present

## 2017-03-01 DIAGNOSIS — I10 Essential (primary) hypertension: Secondary | ICD-10-CM | POA: Diagnosis not present

## 2017-03-01 DIAGNOSIS — Z131 Encounter for screening for diabetes mellitus: Secondary | ICD-10-CM | POA: Diagnosis not present

## 2017-03-01 DIAGNOSIS — J9611 Chronic respiratory failure with hypoxia: Secondary | ICD-10-CM | POA: Insufficient documentation

## 2017-03-01 DIAGNOSIS — E038 Other specified hypothyroidism: Secondary | ICD-10-CM | POA: Diagnosis not present

## 2017-03-01 DIAGNOSIS — J449 Chronic obstructive pulmonary disease, unspecified: Secondary | ICD-10-CM | POA: Diagnosis not present

## 2017-03-01 DIAGNOSIS — N186 End stage renal disease: Secondary | ICD-10-CM | POA: Diagnosis not present

## 2017-03-01 DIAGNOSIS — Z13220 Encounter for screening for lipoid disorders: Secondary | ICD-10-CM | POA: Diagnosis not present

## 2017-03-01 DIAGNOSIS — E063 Autoimmune thyroiditis: Secondary | ICD-10-CM | POA: Diagnosis not present

## 2017-03-02 DIAGNOSIS — Z992 Dependence on renal dialysis: Secondary | ICD-10-CM | POA: Diagnosis not present

## 2017-03-02 DIAGNOSIS — D631 Anemia in chronic kidney disease: Secondary | ICD-10-CM | POA: Diagnosis not present

## 2017-03-02 DIAGNOSIS — N186 End stage renal disease: Secondary | ICD-10-CM | POA: Diagnosis not present

## 2017-03-02 DIAGNOSIS — N2581 Secondary hyperparathyroidism of renal origin: Secondary | ICD-10-CM | POA: Diagnosis not present

## 2017-03-02 DIAGNOSIS — D509 Iron deficiency anemia, unspecified: Secondary | ICD-10-CM | POA: Diagnosis not present

## 2017-03-04 ENCOUNTER — Other Ambulatory Visit: Payer: Self-pay

## 2017-03-04 DIAGNOSIS — N2581 Secondary hyperparathyroidism of renal origin: Secondary | ICD-10-CM | POA: Diagnosis not present

## 2017-03-04 DIAGNOSIS — D631 Anemia in chronic kidney disease: Secondary | ICD-10-CM | POA: Diagnosis not present

## 2017-03-04 DIAGNOSIS — Z992 Dependence on renal dialysis: Secondary | ICD-10-CM | POA: Diagnosis not present

## 2017-03-04 DIAGNOSIS — N186 End stage renal disease: Secondary | ICD-10-CM | POA: Diagnosis not present

## 2017-03-04 DIAGNOSIS — D509 Iron deficiency anemia, unspecified: Secondary | ICD-10-CM | POA: Diagnosis not present

## 2017-03-06 DIAGNOSIS — N2581 Secondary hyperparathyroidism of renal origin: Secondary | ICD-10-CM | POA: Diagnosis not present

## 2017-03-06 DIAGNOSIS — N186 End stage renal disease: Secondary | ICD-10-CM | POA: Diagnosis not present

## 2017-03-06 DIAGNOSIS — Z992 Dependence on renal dialysis: Secondary | ICD-10-CM | POA: Diagnosis not present

## 2017-03-06 DIAGNOSIS — D509 Iron deficiency anemia, unspecified: Secondary | ICD-10-CM | POA: Diagnosis not present

## 2017-03-06 DIAGNOSIS — D631 Anemia in chronic kidney disease: Secondary | ICD-10-CM | POA: Diagnosis not present

## 2017-03-09 DIAGNOSIS — D631 Anemia in chronic kidney disease: Secondary | ICD-10-CM | POA: Diagnosis not present

## 2017-03-09 DIAGNOSIS — N186 End stage renal disease: Secondary | ICD-10-CM | POA: Diagnosis not present

## 2017-03-09 DIAGNOSIS — N2581 Secondary hyperparathyroidism of renal origin: Secondary | ICD-10-CM | POA: Diagnosis not present

## 2017-03-09 DIAGNOSIS — Z992 Dependence on renal dialysis: Secondary | ICD-10-CM | POA: Diagnosis not present

## 2017-03-09 DIAGNOSIS — D509 Iron deficiency anemia, unspecified: Secondary | ICD-10-CM | POA: Diagnosis not present

## 2017-03-11 DIAGNOSIS — Z992 Dependence on renal dialysis: Secondary | ICD-10-CM | POA: Diagnosis not present

## 2017-03-11 DIAGNOSIS — D509 Iron deficiency anemia, unspecified: Secondary | ICD-10-CM | POA: Diagnosis not present

## 2017-03-11 DIAGNOSIS — N2581 Secondary hyperparathyroidism of renal origin: Secondary | ICD-10-CM | POA: Diagnosis not present

## 2017-03-11 DIAGNOSIS — N186 End stage renal disease: Secondary | ICD-10-CM | POA: Diagnosis not present

## 2017-03-11 DIAGNOSIS — D631 Anemia in chronic kidney disease: Secondary | ICD-10-CM | POA: Diagnosis not present

## 2017-03-13 DIAGNOSIS — D509 Iron deficiency anemia, unspecified: Secondary | ICD-10-CM | POA: Diagnosis not present

## 2017-03-13 DIAGNOSIS — Z992 Dependence on renal dialysis: Secondary | ICD-10-CM | POA: Diagnosis not present

## 2017-03-13 DIAGNOSIS — N186 End stage renal disease: Secondary | ICD-10-CM | POA: Diagnosis not present

## 2017-03-13 DIAGNOSIS — D631 Anemia in chronic kidney disease: Secondary | ICD-10-CM | POA: Diagnosis not present

## 2017-03-13 DIAGNOSIS — N2581 Secondary hyperparathyroidism of renal origin: Secondary | ICD-10-CM | POA: Diagnosis not present

## 2017-03-15 ENCOUNTER — Encounter (INDEPENDENT_AMBULATORY_CARE_PROVIDER_SITE_OTHER): Payer: Self-pay

## 2017-03-15 ENCOUNTER — Other Ambulatory Visit (INDEPENDENT_AMBULATORY_CARE_PROVIDER_SITE_OTHER): Payer: Self-pay | Admitting: Vascular Surgery

## 2017-03-16 ENCOUNTER — Other Ambulatory Visit: Payer: Self-pay | Admitting: Family Medicine

## 2017-03-16 DIAGNOSIS — Z992 Dependence on renal dialysis: Secondary | ICD-10-CM | POA: Diagnosis not present

## 2017-03-16 DIAGNOSIS — N2581 Secondary hyperparathyroidism of renal origin: Secondary | ICD-10-CM | POA: Diagnosis not present

## 2017-03-16 DIAGNOSIS — D509 Iron deficiency anemia, unspecified: Secondary | ICD-10-CM | POA: Diagnosis not present

## 2017-03-16 DIAGNOSIS — D631 Anemia in chronic kidney disease: Secondary | ICD-10-CM | POA: Diagnosis not present

## 2017-03-16 DIAGNOSIS — N186 End stage renal disease: Secondary | ICD-10-CM | POA: Diagnosis not present

## 2017-03-18 DIAGNOSIS — D631 Anemia in chronic kidney disease: Secondary | ICD-10-CM | POA: Diagnosis not present

## 2017-03-18 DIAGNOSIS — Z992 Dependence on renal dialysis: Secondary | ICD-10-CM | POA: Diagnosis not present

## 2017-03-18 DIAGNOSIS — N2581 Secondary hyperparathyroidism of renal origin: Secondary | ICD-10-CM | POA: Diagnosis not present

## 2017-03-18 DIAGNOSIS — D509 Iron deficiency anemia, unspecified: Secondary | ICD-10-CM | POA: Diagnosis not present

## 2017-03-18 DIAGNOSIS — N186 End stage renal disease: Secondary | ICD-10-CM | POA: Diagnosis not present

## 2017-03-18 MED ORDER — CEFAZOLIN SODIUM-DEXTROSE 1-4 GM/50ML-% IV SOLN
1.0000 g | Freq: Once | INTRAVENOUS | Status: AC
  Administered 2017-03-19: 1 g via INTRAVENOUS

## 2017-03-19 ENCOUNTER — Ambulatory Visit
Admission: RE | Admit: 2017-03-19 | Discharge: 2017-03-19 | Disposition: A | Discharge status: Home / Self Care | Payer: Medicare Other | Source: Ambulatory Visit | Attending: Vascular Surgery | Admitting: Vascular Surgery

## 2017-03-19 ENCOUNTER — Encounter
Admission: RE | Disposition: A | Discharge status: Home / Self Care | Payer: Self-pay | Source: Ambulatory Visit | Attending: Vascular Surgery

## 2017-03-19 DIAGNOSIS — T82868A Thrombosis of vascular prosthetic devices, implants and grafts, initial encounter: Secondary | ICD-10-CM | POA: Diagnosis not present

## 2017-03-19 DIAGNOSIS — Z87891 Personal history of nicotine dependence: Secondary | ICD-10-CM | POA: Diagnosis not present

## 2017-03-19 DIAGNOSIS — Z992 Dependence on renal dialysis: Secondary | ICD-10-CM | POA: Insufficient documentation

## 2017-03-19 DIAGNOSIS — E1122 Type 2 diabetes mellitus with diabetic chronic kidney disease: Secondary | ICD-10-CM | POA: Diagnosis not present

## 2017-03-19 DIAGNOSIS — I132 Hypertensive heart and chronic kidney disease with heart failure and with stage 5 chronic kidney disease, or end stage renal disease: Secondary | ICD-10-CM | POA: Insufficient documentation

## 2017-03-19 DIAGNOSIS — Z9981 Dependence on supplemental oxygen: Secondary | ICD-10-CM | POA: Insufficient documentation

## 2017-03-19 DIAGNOSIS — T82858A Stenosis of vascular prosthetic devices, implants and grafts, initial encounter: Secondary | ICD-10-CM | POA: Diagnosis not present

## 2017-03-19 DIAGNOSIS — E538 Deficiency of other specified B group vitamins: Secondary | ICD-10-CM | POA: Insufficient documentation

## 2017-03-19 DIAGNOSIS — I5032 Chronic diastolic (congestive) heart failure: Secondary | ICD-10-CM | POA: Diagnosis not present

## 2017-03-19 DIAGNOSIS — E039 Hypothyroidism, unspecified: Secondary | ICD-10-CM | POA: Diagnosis not present

## 2017-03-19 DIAGNOSIS — I2510 Atherosclerotic heart disease of native coronary artery without angina pectoris: Secondary | ICD-10-CM | POA: Insufficient documentation

## 2017-03-19 DIAGNOSIS — Z933 Colostomy status: Secondary | ICD-10-CM | POA: Diagnosis not present

## 2017-03-19 DIAGNOSIS — Z90710 Acquired absence of both cervix and uterus: Secondary | ICD-10-CM | POA: Insufficient documentation

## 2017-03-19 DIAGNOSIS — Z823 Family history of stroke: Secondary | ICD-10-CM | POA: Diagnosis not present

## 2017-03-19 DIAGNOSIS — Z888 Allergy status to other drugs, medicaments and biological substances status: Secondary | ICD-10-CM | POA: Diagnosis not present

## 2017-03-19 DIAGNOSIS — Z8679 Personal history of other diseases of the circulatory system: Secondary | ICD-10-CM | POA: Insufficient documentation

## 2017-03-19 DIAGNOSIS — Z951 Presence of aortocoronary bypass graft: Secondary | ICD-10-CM | POA: Diagnosis not present

## 2017-03-19 DIAGNOSIS — K219 Gastro-esophageal reflux disease without esophagitis: Secondary | ICD-10-CM | POA: Insufficient documentation

## 2017-03-19 DIAGNOSIS — Z8249 Family history of ischemic heart disease and other diseases of the circulatory system: Secondary | ICD-10-CM | POA: Insufficient documentation

## 2017-03-19 DIAGNOSIS — E785 Hyperlipidemia, unspecified: Secondary | ICD-10-CM | POA: Diagnosis not present

## 2017-03-19 DIAGNOSIS — Y832 Surgical operation with anastomosis, bypass or graft as the cause of abnormal reaction of the patient, or of later complication, without mention of misadventure at the time of the procedure: Secondary | ICD-10-CM | POA: Diagnosis not present

## 2017-03-19 DIAGNOSIS — J449 Chronic obstructive pulmonary disease, unspecified: Secondary | ICD-10-CM | POA: Diagnosis not present

## 2017-03-19 DIAGNOSIS — I953 Hypotension of hemodialysis: Secondary | ICD-10-CM | POA: Diagnosis not present

## 2017-03-19 DIAGNOSIS — Z9889 Other specified postprocedural states: Secondary | ICD-10-CM | POA: Insufficient documentation

## 2017-03-19 DIAGNOSIS — E119 Type 2 diabetes mellitus without complications: Secondary | ICD-10-CM | POA: Diagnosis not present

## 2017-03-19 DIAGNOSIS — Z8049 Family history of malignant neoplasm of other genital organs: Secondary | ICD-10-CM | POA: Insufficient documentation

## 2017-03-19 DIAGNOSIS — Z886 Allergy status to analgesic agent status: Secondary | ICD-10-CM | POA: Diagnosis not present

## 2017-03-19 DIAGNOSIS — I10 Essential (primary) hypertension: Secondary | ICD-10-CM | POA: Diagnosis not present

## 2017-03-19 DIAGNOSIS — N186 End stage renal disease: Secondary | ICD-10-CM | POA: Insufficient documentation

## 2017-03-19 DIAGNOSIS — Z8619 Personal history of other infectious and parasitic diseases: Secondary | ICD-10-CM | POA: Insufficient documentation

## 2017-03-19 HISTORY — PX: A/V FISTULAGRAM: CATH118298

## 2017-03-19 LAB — POTASSIUM (ARMC VASCULAR LAB ONLY): Potassium (ARMC vascular lab): 3.9 (ref 3.5–5.1)

## 2017-03-19 SURGERY — A/V FISTULAGRAM
Anesthesia: Moderate Sedation | Laterality: Left

## 2017-03-19 MED ORDER — IOPAMIDOL (ISOVUE-300) INJECTION 61%
INTRAVENOUS | Status: DC | PRN
  Administered 2017-03-19: 25 mL via INTRAVENOUS

## 2017-03-19 MED ORDER — MIDAZOLAM HCL 2 MG/2ML IJ SOLN
INTRAMUSCULAR | Status: DC | PRN
  Administered 2017-03-19 (×3): 1 mg via INTRAVENOUS

## 2017-03-19 MED ORDER — FENTANYL CITRATE (PF) 100 MCG/2ML IJ SOLN
INTRAMUSCULAR | Status: AC
  Filled 2017-03-19: qty 2

## 2017-03-19 MED ORDER — ONDANSETRON HCL 4 MG/2ML IJ SOLN: 4.0000 mg | Freq: Four times a day (QID) | INTRAMUSCULAR | Status: DC | PRN

## 2017-03-19 MED ORDER — METHYLPREDNISOLONE SODIUM SUCC 125 MG IJ SOLR: 125.0000 mg | INTRAMUSCULAR | Status: DC | PRN

## 2017-03-19 MED ORDER — FAMOTIDINE 20 MG PO TABS: 40.0000 mg | ORAL_TABLET | ORAL | Status: DC | PRN

## 2017-03-19 MED ORDER — SODIUM CHLORIDE 0.9 % IV SOLN
INTRAVENOUS | Status: DC
  Administered 2017-03-19: 09:00:00 via INTRAVENOUS

## 2017-03-19 MED ORDER — LIDOCAINE HCL (PF) 1 % IJ SOLN
INTRAMUSCULAR | Status: AC
  Filled 2017-03-19: qty 30

## 2017-03-19 MED ORDER — MIDAZOLAM HCL 5 MG/5ML IJ SOLN
INTRAMUSCULAR | Status: AC
  Filled 2017-03-19: qty 5

## 2017-03-19 MED ORDER — FENTANYL CITRATE (PF) 100 MCG/2ML IJ SOLN
INTRAMUSCULAR | Status: DC | PRN
  Administered 2017-03-19 (×2): 50 ug via INTRAVENOUS

## 2017-03-19 MED ORDER — HEPARIN SODIUM (PORCINE) 1000 UNIT/ML IJ SOLN
INTRAMUSCULAR | Status: AC
  Filled 2017-03-19: qty 1

## 2017-03-19 MED ORDER — HYDROMORPHONE HCL 1 MG/ML IJ SOLN: 1.0000 mg | Freq: Once | INTRAMUSCULAR | Status: DC | PRN

## 2017-03-19 SURGICAL SUPPLY — 14 items
BALLN DORADO 8X60X80 (BALLOONS) ×3
BALLN LUTONIX AV 8X60X75 (BALLOONS) ×3
BALLOON DORADO 8X60X80 (BALLOONS) IMPLANT
BALLOON LUTONIX AV 8X60X75 (BALLOONS) IMPLANT
DEVICE PRESTO INFLATION (MISCELLANEOUS) ×2 IMPLANT
NDL ENTRY 21GA 7CM ECHOTIP (NEEDLE) IMPLANT
NEEDLE ENTRY 21GA 7CM ECHOTIP (NEEDLE) ×3 IMPLANT
PACK ANGIOGRAPHY (CUSTOM PROCEDURE TRAY) ×3 IMPLANT
SET INTRO CAPELLA COAXIAL (SET/KITS/TRAYS/PACK) ×2 IMPLANT
SHEATH BRITE TIP 6FRX5.5 (SHEATH) ×2 IMPLANT
SUT MNCRL 4-0 (SUTURE) ×3
SUT MNCRL 4-0 27XMFL (SUTURE) ×1
SUTURE MNCRL 4-0 27XMF (SUTURE) IMPLANT
WIRE MAGIC TOR.035 180C (WIRE) ×2 IMPLANT
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
## 2017-03-19 NOTE — Discharge Instructions (Signed)

## 2017-03-19 NOTE — Op Note (Signed)
OPERATIVE NOTE   PROCEDURE: 1. Contrast injection left brachial axillary AV access 2. Percutaneous transluminal angioplasty mid graft and axillary vein to 8 mm using a Lutonix drug-eluting balloon  PRE-OPERATIVE DIAGNOSIS: Complication of dialysis access                                                       End Stage Renal Disease  POST-OPERATIVE DIAGNOSIS: same as above   SURGEON: Katha Cabal, M.D.  ANESTHESIA: Conscious sedation was administered under my direct supervision by the interventional radiology RN. IV Versed plus fentanyl were utilized. Continuous ECG, pulse oximetry and blood pressure was monitored throughout the entire procedure.  Conscious sedation was for a total of 31.  ESTIMATED BLOOD LOSS: minimal  FINDING(S): Stricture of the AV graft  SPECIMEN(S):  None  CONTRAST: 25 cc  FLUOROSCOPY TIME: 2.2 minutes  INDICATIONS: [name] is a 66 y.o. female who  presents with malfunctioning left brachial axillary AV access.  The patient is scheduled for angiography with possible intervention of the AV access.  The patient is aware the risks include but are not limited to: bleeding, infection, thrombosis of the cannulated access, and possible anaphylactic reaction to the contrast.  The patient acknowledges if the access can not be salvaged a tunneled catheter will be needed and will be placed during this procedure.  The patient is aware of the risks of the procedure and elects to proceed with the angiogram and intervention.  DESCRIPTION: After full informed written consent was obtained, the patient was brought back to the Special Procedure suite and placed supine position.  Appropriate cardiopulmonary monitors were placed.  The left arm was prepped and draped in the standard fashion.  Appropriate timeout is called. The left brachial axillary graft was cannulated with a micropuncture needle.  Cannulation was performed with ultrasound guidance. Ultrasound was placed  in a sterile sleeve, the AV access was interrogated and noted to be echolucent and compressible indicating patency. Image was recorded for the permanent record. The puncture is performed under continuous ultrasound visualization.   The microwire was advanced and the needle was exchanged for  a microsheath.  The J-wire was then advanced and a 6 Fr sheath inserted.  Hand injections were completed to image the access from the arterial anastomosis through the entire access.  The central venous structures were also imaged by hand injections.  Based on the images, no heparin was given as the patient has positive heparin antibody.  A wire was negotiated through the strictures within the venous portion of the graft.  Predilatation was performed with an 8 mm [place] balloon an 8 mm Lutonix drug-eluting balloon was used.  Inflation was to 12 atm for 1 minute.  Follow-up imaging demonstrates complete resolution of the stricture with rapid flow of contrast through the graft, the central venous anatomy is preserved.  A 4-0 Monocryl purse-string suture was sewn around the sheath.  The sheath was removed and light pressure was applied.  A sterile bandage was applied to the puncture site.    COMPLICATIONS: None  CONDITION: [name], M.D [facility] Office: [phone]  03/19/2017 9:55 AM

## 2017-03-20 DIAGNOSIS — N2581 Secondary hyperparathyroidism of renal origin: Secondary | ICD-10-CM | POA: Diagnosis not present

## 2017-03-20 DIAGNOSIS — Z992 Dependence on renal dialysis: Secondary | ICD-10-CM | POA: Diagnosis not present

## 2017-03-20 DIAGNOSIS — D509 Iron deficiency anemia, unspecified: Secondary | ICD-10-CM | POA: Diagnosis not present

## 2017-03-20 DIAGNOSIS — D631 Anemia in chronic kidney disease: Secondary | ICD-10-CM | POA: Diagnosis not present

## 2017-03-20 DIAGNOSIS — N186 End stage renal disease: Secondary | ICD-10-CM | POA: Diagnosis not present

## 2017-03-22 DIAGNOSIS — Z992 Dependence on renal dialysis: Secondary | ICD-10-CM | POA: Diagnosis not present

## 2017-03-22 DIAGNOSIS — D509 Iron deficiency anemia, unspecified: Secondary | ICD-10-CM | POA: Diagnosis not present

## 2017-03-22 DIAGNOSIS — N186 End stage renal disease: Secondary | ICD-10-CM | POA: Diagnosis not present

## 2017-03-22 DIAGNOSIS — D631 Anemia in chronic kidney disease: Secondary | ICD-10-CM | POA: Diagnosis not present

## 2017-03-22 DIAGNOSIS — N2581 Secondary hyperparathyroidism of renal origin: Secondary | ICD-10-CM | POA: Diagnosis not present

## 2017-03-25 DIAGNOSIS — Z992 Dependence on renal dialysis: Secondary | ICD-10-CM | POA: Diagnosis not present

## 2017-03-25 DIAGNOSIS — N186 End stage renal disease: Secondary | ICD-10-CM | POA: Diagnosis not present

## 2017-03-25 DIAGNOSIS — N2581 Secondary hyperparathyroidism of renal origin: Secondary | ICD-10-CM | POA: Diagnosis not present

## 2017-03-25 DIAGNOSIS — D631 Anemia in chronic kidney disease: Secondary | ICD-10-CM | POA: Diagnosis not present

## 2017-03-25 DIAGNOSIS — D509 Iron deficiency anemia, unspecified: Secondary | ICD-10-CM | POA: Diagnosis not present

## 2017-03-27 DIAGNOSIS — N2581 Secondary hyperparathyroidism of renal origin: Secondary | ICD-10-CM | POA: Diagnosis not present

## 2017-03-27 DIAGNOSIS — N186 End stage renal disease: Secondary | ICD-10-CM | POA: Diagnosis not present

## 2017-03-27 DIAGNOSIS — D631 Anemia in chronic kidney disease: Secondary | ICD-10-CM | POA: Diagnosis not present

## 2017-03-27 DIAGNOSIS — D509 Iron deficiency anemia, unspecified: Secondary | ICD-10-CM | POA: Diagnosis not present

## 2017-03-27 DIAGNOSIS — Z992 Dependence on renal dialysis: Secondary | ICD-10-CM | POA: Diagnosis not present

## 2017-03-29 DIAGNOSIS — Z992 Dependence on renal dialysis: Secondary | ICD-10-CM | POA: Diagnosis not present

## 2017-03-29 DIAGNOSIS — N186 End stage renal disease: Secondary | ICD-10-CM | POA: Diagnosis not present

## 2017-03-30 DIAGNOSIS — N186 End stage renal disease: Secondary | ICD-10-CM | POA: Diagnosis not present

## 2017-03-30 DIAGNOSIS — D631 Anemia in chronic kidney disease: Secondary | ICD-10-CM | POA: Diagnosis not present

## 2017-03-30 DIAGNOSIS — N2581 Secondary hyperparathyroidism of renal origin: Secondary | ICD-10-CM | POA: Diagnosis not present

## 2017-03-30 DIAGNOSIS — Z992 Dependence on renal dialysis: Secondary | ICD-10-CM | POA: Diagnosis not present

## 2017-03-30 DIAGNOSIS — D509 Iron deficiency anemia, unspecified: Secondary | ICD-10-CM | POA: Diagnosis not present

## 2017-04-01 DIAGNOSIS — N186 End stage renal disease: Secondary | ICD-10-CM | POA: Diagnosis not present

## 2017-04-01 DIAGNOSIS — Z992 Dependence on renal dialysis: Secondary | ICD-10-CM | POA: Diagnosis not present

## 2017-04-01 DIAGNOSIS — N2581 Secondary hyperparathyroidism of renal origin: Secondary | ICD-10-CM | POA: Diagnosis not present

## 2017-04-01 DIAGNOSIS — D631 Anemia in chronic kidney disease: Secondary | ICD-10-CM | POA: Diagnosis not present

## 2017-04-01 DIAGNOSIS — D509 Iron deficiency anemia, unspecified: Secondary | ICD-10-CM | POA: Diagnosis not present

## 2017-04-03 DIAGNOSIS — D509 Iron deficiency anemia, unspecified: Secondary | ICD-10-CM | POA: Diagnosis not present

## 2017-04-03 DIAGNOSIS — N186 End stage renal disease: Secondary | ICD-10-CM | POA: Diagnosis not present

## 2017-04-03 DIAGNOSIS — D631 Anemia in chronic kidney disease: Secondary | ICD-10-CM | POA: Diagnosis not present

## 2017-04-03 DIAGNOSIS — N2581 Secondary hyperparathyroidism of renal origin: Secondary | ICD-10-CM | POA: Diagnosis not present

## 2017-04-03 DIAGNOSIS — Z992 Dependence on renal dialysis: Secondary | ICD-10-CM | POA: Diagnosis not present

## 2017-04-06 DIAGNOSIS — N186 End stage renal disease: Secondary | ICD-10-CM | POA: Diagnosis not present

## 2017-04-06 DIAGNOSIS — N2581 Secondary hyperparathyroidism of renal origin: Secondary | ICD-10-CM | POA: Diagnosis not present

## 2017-04-06 DIAGNOSIS — D509 Iron deficiency anemia, unspecified: Secondary | ICD-10-CM | POA: Diagnosis not present

## 2017-04-06 DIAGNOSIS — Z992 Dependence on renal dialysis: Secondary | ICD-10-CM | POA: Diagnosis not present

## 2017-04-06 DIAGNOSIS — D631 Anemia in chronic kidney disease: Secondary | ICD-10-CM | POA: Diagnosis not present

## 2017-04-08 DIAGNOSIS — N2581 Secondary hyperparathyroidism of renal origin: Secondary | ICD-10-CM | POA: Diagnosis not present

## 2017-04-08 DIAGNOSIS — N186 End stage renal disease: Secondary | ICD-10-CM | POA: Diagnosis not present

## 2017-04-08 DIAGNOSIS — D631 Anemia in chronic kidney disease: Secondary | ICD-10-CM | POA: Diagnosis not present

## 2017-04-08 DIAGNOSIS — Z992 Dependence on renal dialysis: Secondary | ICD-10-CM | POA: Diagnosis not present

## 2017-04-08 DIAGNOSIS — D509 Iron deficiency anemia, unspecified: Secondary | ICD-10-CM | POA: Diagnosis not present

## 2017-04-10 DIAGNOSIS — N186 End stage renal disease: Secondary | ICD-10-CM | POA: Diagnosis not present

## 2017-04-10 DIAGNOSIS — D631 Anemia in chronic kidney disease: Secondary | ICD-10-CM | POA: Diagnosis not present

## 2017-04-10 DIAGNOSIS — D509 Iron deficiency anemia, unspecified: Secondary | ICD-10-CM | POA: Diagnosis not present

## 2017-04-10 DIAGNOSIS — Z992 Dependence on renal dialysis: Secondary | ICD-10-CM | POA: Diagnosis not present

## 2017-04-10 DIAGNOSIS — N2581 Secondary hyperparathyroidism of renal origin: Secondary | ICD-10-CM | POA: Diagnosis not present

## 2017-04-13 DIAGNOSIS — D631 Anemia in chronic kidney disease: Secondary | ICD-10-CM | POA: Diagnosis not present

## 2017-04-13 DIAGNOSIS — D509 Iron deficiency anemia, unspecified: Secondary | ICD-10-CM | POA: Diagnosis not present

## 2017-04-13 DIAGNOSIS — N2581 Secondary hyperparathyroidism of renal origin: Secondary | ICD-10-CM | POA: Diagnosis not present

## 2017-04-13 DIAGNOSIS — N186 End stage renal disease: Secondary | ICD-10-CM | POA: Diagnosis not present

## 2017-04-13 DIAGNOSIS — Z992 Dependence on renal dialysis: Secondary | ICD-10-CM | POA: Diagnosis not present

## 2017-04-15 DIAGNOSIS — D509 Iron deficiency anemia, unspecified: Secondary | ICD-10-CM | POA: Diagnosis not present

## 2017-04-15 DIAGNOSIS — N186 End stage renal disease: Secondary | ICD-10-CM | POA: Diagnosis not present

## 2017-04-15 DIAGNOSIS — N2581 Secondary hyperparathyroidism of renal origin: Secondary | ICD-10-CM | POA: Diagnosis not present

## 2017-04-15 DIAGNOSIS — Z992 Dependence on renal dialysis: Secondary | ICD-10-CM | POA: Diagnosis not present

## 2017-04-15 DIAGNOSIS — D631 Anemia in chronic kidney disease: Secondary | ICD-10-CM | POA: Diagnosis not present

## 2017-04-17 DIAGNOSIS — D631 Anemia in chronic kidney disease: Secondary | ICD-10-CM | POA: Diagnosis not present

## 2017-04-17 DIAGNOSIS — Z992 Dependence on renal dialysis: Secondary | ICD-10-CM | POA: Diagnosis not present

## 2017-04-17 DIAGNOSIS — N2581 Secondary hyperparathyroidism of renal origin: Secondary | ICD-10-CM | POA: Diagnosis not present

## 2017-04-17 DIAGNOSIS — N186 End stage renal disease: Secondary | ICD-10-CM | POA: Diagnosis not present

## 2017-04-17 DIAGNOSIS — D509 Iron deficiency anemia, unspecified: Secondary | ICD-10-CM | POA: Diagnosis not present

## 2017-04-20 DIAGNOSIS — Z992 Dependence on renal dialysis: Secondary | ICD-10-CM | POA: Diagnosis not present

## 2017-04-20 DIAGNOSIS — N186 End stage renal disease: Secondary | ICD-10-CM | POA: Diagnosis not present

## 2017-04-20 DIAGNOSIS — D509 Iron deficiency anemia, unspecified: Secondary | ICD-10-CM | POA: Diagnosis not present

## 2017-04-20 DIAGNOSIS — N2581 Secondary hyperparathyroidism of renal origin: Secondary | ICD-10-CM | POA: Diagnosis not present

## 2017-04-20 DIAGNOSIS — D631 Anemia in chronic kidney disease: Secondary | ICD-10-CM | POA: Diagnosis not present

## 2017-04-21 ENCOUNTER — Encounter (INDEPENDENT_AMBULATORY_CARE_PROVIDER_SITE_OTHER): Payer: Self-pay | Admitting: Vascular Surgery

## 2017-04-21 ENCOUNTER — Ambulatory Visit (INDEPENDENT_AMBULATORY_CARE_PROVIDER_SITE_OTHER): Payer: Medicare Other | Admitting: Vascular Surgery

## 2017-04-21 ENCOUNTER — Ambulatory Visit (INDEPENDENT_AMBULATORY_CARE_PROVIDER_SITE_OTHER): Payer: Medicare Other

## 2017-04-21 VITALS — BP 126/65 | HR 88 | Resp 15 | Ht 64.0 in | Wt 128.0 lb

## 2017-04-21 DIAGNOSIS — T829XXS Unspecified complication of cardiac and vascular prosthetic device, implant and graft, sequela: Secondary | ICD-10-CM | POA: Diagnosis not present

## 2017-04-21 DIAGNOSIS — N186 End stage renal disease: Secondary | ICD-10-CM
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
## 2017-04-22 DIAGNOSIS — D631 Anemia in chronic kidney disease: Secondary | ICD-10-CM | POA: Diagnosis not present

## 2017-04-22 DIAGNOSIS — N186 End stage renal disease: Secondary | ICD-10-CM | POA: Diagnosis not present

## 2017-04-22 DIAGNOSIS — D509 Iron deficiency anemia, unspecified: Secondary | ICD-10-CM | POA: Diagnosis not present

## 2017-04-22 DIAGNOSIS — N2581 Secondary hyperparathyroidism of renal origin: Secondary | ICD-10-CM | POA: Diagnosis not present

## 2017-04-22 DIAGNOSIS — Z992 Dependence on renal dialysis: Secondary | ICD-10-CM | POA: Diagnosis not present

## 2017-04-24 DIAGNOSIS — Z992 Dependence on renal dialysis: Secondary | ICD-10-CM | POA: Diagnosis not present

## 2017-04-24 DIAGNOSIS — D509 Iron deficiency anemia, unspecified: Secondary | ICD-10-CM | POA: Diagnosis not present

## 2017-04-24 DIAGNOSIS — N2581 Secondary hyperparathyroidism of renal origin: Secondary | ICD-10-CM | POA: Diagnosis not present

## 2017-04-24 DIAGNOSIS — D631 Anemia in chronic kidney disease: Secondary | ICD-10-CM | POA: Diagnosis not present

## 2017-04-24 DIAGNOSIS — N186 End stage renal disease: Secondary | ICD-10-CM | POA: Diagnosis not present

## 2017-04-27 DIAGNOSIS — N186 End stage renal disease: Secondary | ICD-10-CM | POA: Diagnosis not present

## 2017-04-27 DIAGNOSIS — D631 Anemia in chronic kidney disease: Secondary | ICD-10-CM | POA: Diagnosis not present

## 2017-04-27 DIAGNOSIS — Z992 Dependence on renal dialysis: Secondary | ICD-10-CM | POA: Diagnosis not present

## 2017-04-27 DIAGNOSIS — D509 Iron deficiency anemia, unspecified: Secondary | ICD-10-CM | POA: Diagnosis not present

## 2017-04-27 DIAGNOSIS — N2581 Secondary hyperparathyroidism of renal origin: Secondary | ICD-10-CM | POA: Diagnosis not present

## 2017-04-29 DIAGNOSIS — N186 End stage renal disease: Secondary | ICD-10-CM | POA: Diagnosis not present

## 2017-04-29 DIAGNOSIS — N2581 Secondary hyperparathyroidism of renal origin: Secondary | ICD-10-CM | POA: Diagnosis not present

## 2017-04-29 DIAGNOSIS — D631 Anemia in chronic kidney disease: Secondary | ICD-10-CM | POA: Diagnosis not present

## 2017-04-29 DIAGNOSIS — Z992 Dependence on renal dialysis: Secondary | ICD-10-CM | POA: Diagnosis not present

## 2017-04-29 DIAGNOSIS — D509 Iron deficiency anemia, unspecified: Secondary | ICD-10-CM | POA: Diagnosis not present

## 2017-05-01 DIAGNOSIS — D631 Anemia in chronic kidney disease: Secondary | ICD-10-CM | POA: Diagnosis not present

## 2017-05-01 DIAGNOSIS — D509 Iron deficiency anemia, unspecified: Secondary | ICD-10-CM | POA: Diagnosis not present

## 2017-05-01 DIAGNOSIS — N186 End stage renal disease: Secondary | ICD-10-CM | POA: Diagnosis not present

## 2017-05-01 DIAGNOSIS — N2581 Secondary hyperparathyroidism of renal origin: Secondary | ICD-10-CM | POA: Diagnosis not present

## 2017-05-01 DIAGNOSIS — Z992 Dependence on renal dialysis: Secondary | ICD-10-CM | POA: Diagnosis not present

## 2017-05-01 DIAGNOSIS — Z23 Encounter for immunization: Secondary | ICD-10-CM | POA: Diagnosis not present

## 2017-05-04 DIAGNOSIS — D631 Anemia in chronic kidney disease: Secondary | ICD-10-CM | POA: Diagnosis not present

## 2017-05-04 DIAGNOSIS — Z23 Encounter for immunization: Secondary | ICD-10-CM | POA: Diagnosis not present

## 2017-05-04 DIAGNOSIS — D509 Iron deficiency anemia, unspecified: Secondary | ICD-10-CM | POA: Diagnosis not present

## 2017-05-04 DIAGNOSIS — N2581 Secondary hyperparathyroidism of renal origin: Secondary | ICD-10-CM | POA: Diagnosis not present

## 2017-05-04 DIAGNOSIS — N186 End stage renal disease: Secondary | ICD-10-CM | POA: Diagnosis not present

## 2017-05-04 DIAGNOSIS — Z992 Dependence on renal dialysis: Secondary | ICD-10-CM | POA: Diagnosis not present

## 2017-05-06 DIAGNOSIS — D631 Anemia in chronic kidney disease: Secondary | ICD-10-CM | POA: Diagnosis not present

## 2017-05-06 DIAGNOSIS — N186 End stage renal disease: Secondary | ICD-10-CM | POA: Diagnosis not present

## 2017-05-06 DIAGNOSIS — N2581 Secondary hyperparathyroidism of renal origin: Secondary | ICD-10-CM | POA: Diagnosis not present

## 2017-05-06 DIAGNOSIS — Z992 Dependence on renal dialysis: Secondary | ICD-10-CM | POA: Diagnosis not present

## 2017-05-06 DIAGNOSIS — Z23 Encounter for immunization: Secondary | ICD-10-CM | POA: Diagnosis not present

## 2017-05-06 DIAGNOSIS — D509 Iron deficiency anemia, unspecified: Secondary | ICD-10-CM | POA: Diagnosis not present

## 2017-05-07 DIAGNOSIS — E038 Other specified hypothyroidism: Secondary | ICD-10-CM | POA: Diagnosis not present

## 2017-05-07 DIAGNOSIS — E063 Autoimmune thyroiditis: Secondary | ICD-10-CM | POA: Diagnosis not present

## 2017-05-08 DIAGNOSIS — D631 Anemia in chronic kidney disease: Secondary | ICD-10-CM | POA: Diagnosis not present

## 2017-05-08 DIAGNOSIS — Z992 Dependence on renal dialysis: Secondary | ICD-10-CM | POA: Diagnosis not present

## 2017-05-08 DIAGNOSIS — Z23 Encounter for immunization: Secondary | ICD-10-CM | POA: Diagnosis not present

## 2017-05-08 DIAGNOSIS — N186 End stage renal disease: Secondary | ICD-10-CM | POA: Diagnosis not present

## 2017-05-08 DIAGNOSIS — D509 Iron deficiency anemia, unspecified: Secondary | ICD-10-CM | POA: Diagnosis not present

## 2017-05-08 DIAGNOSIS — N2581 Secondary hyperparathyroidism of renal origin: Secondary | ICD-10-CM | POA: Diagnosis not present

## 2017-05-09 DIAGNOSIS — L03116 Cellulitis of left lower limb: Secondary | ICD-10-CM | POA: Diagnosis not present

## 2017-05-11 DIAGNOSIS — N186 End stage renal disease: Secondary | ICD-10-CM | POA: Diagnosis not present

## 2017-05-11 DIAGNOSIS — N2581 Secondary hyperparathyroidism of renal origin: Secondary | ICD-10-CM | POA: Diagnosis not present

## 2017-05-11 DIAGNOSIS — D509 Iron deficiency anemia, unspecified: Secondary | ICD-10-CM | POA: Diagnosis not present

## 2017-05-11 DIAGNOSIS — D631 Anemia in chronic kidney disease: Secondary | ICD-10-CM | POA: Diagnosis not present

## 2017-05-11 DIAGNOSIS — Z23 Encounter for immunization: Secondary | ICD-10-CM | POA: Diagnosis not present

## 2017-05-11 DIAGNOSIS — Z992 Dependence on renal dialysis: Secondary | ICD-10-CM | POA: Diagnosis not present

## 2017-05-13 DIAGNOSIS — D631 Anemia in chronic kidney disease: Secondary | ICD-10-CM | POA: Diagnosis not present

## 2017-05-13 DIAGNOSIS — D509 Iron deficiency anemia, unspecified: Secondary | ICD-10-CM | POA: Diagnosis not present

## 2017-05-13 DIAGNOSIS — Z23 Encounter for immunization: Secondary | ICD-10-CM | POA: Diagnosis not present

## 2017-05-13 DIAGNOSIS — Z992 Dependence on renal dialysis: Secondary | ICD-10-CM | POA: Diagnosis not present

## 2017-05-13 DIAGNOSIS — N2581 Secondary hyperparathyroidism of renal origin: Secondary | ICD-10-CM | POA: Diagnosis not present

## 2017-05-13 DIAGNOSIS — N186 End stage renal disease: Secondary | ICD-10-CM | POA: Diagnosis not present

## 2017-05-15 DIAGNOSIS — D509 Iron deficiency anemia, unspecified: Secondary | ICD-10-CM | POA: Diagnosis not present

## 2017-05-15 DIAGNOSIS — N186 End stage renal disease: Secondary | ICD-10-CM | POA: Diagnosis not present

## 2017-05-15 DIAGNOSIS — Z23 Encounter for immunization: Secondary | ICD-10-CM | POA: Diagnosis not present

## 2017-05-15 DIAGNOSIS — D631 Anemia in chronic kidney disease: Secondary | ICD-10-CM | POA: Diagnosis not present

## 2017-05-15 DIAGNOSIS — Z992 Dependence on renal dialysis: Secondary | ICD-10-CM | POA: Diagnosis not present

## 2017-05-15 DIAGNOSIS — N2581 Secondary hyperparathyroidism of renal origin: Secondary | ICD-10-CM | POA: Diagnosis not present

## 2017-05-18 DIAGNOSIS — Z23 Encounter for immunization: Secondary | ICD-10-CM | POA: Diagnosis not present

## 2017-05-18 DIAGNOSIS — Z992 Dependence on renal dialysis: Secondary | ICD-10-CM | POA: Diagnosis not present

## 2017-05-18 DIAGNOSIS — D509 Iron deficiency anemia, unspecified: Secondary | ICD-10-CM | POA: Diagnosis not present

## 2017-05-18 DIAGNOSIS — N186 End stage renal disease: Secondary | ICD-10-CM | POA: Diagnosis not present

## 2017-05-18 DIAGNOSIS — N2581 Secondary hyperparathyroidism of renal origin: Secondary | ICD-10-CM | POA: Diagnosis not present

## 2017-05-18 DIAGNOSIS — D631 Anemia in chronic kidney disease: Secondary | ICD-10-CM | POA: Diagnosis not present

## 2017-05-19 DIAGNOSIS — S81802A Unspecified open wound, left lower leg, initial encounter: Secondary | ICD-10-CM | POA: Insufficient documentation

## 2017-05-19 DIAGNOSIS — L03116 Cellulitis of left lower limb: Secondary | ICD-10-CM | POA: Diagnosis not present

## 2017-05-20 DIAGNOSIS — D631 Anemia in chronic kidney disease: Secondary | ICD-10-CM | POA: Diagnosis not present

## 2017-05-20 DIAGNOSIS — D509 Iron deficiency anemia, unspecified: Secondary | ICD-10-CM | POA: Diagnosis not present

## 2017-05-20 DIAGNOSIS — Z992 Dependence on renal dialysis: Secondary | ICD-10-CM | POA: Diagnosis not present

## 2017-05-20 DIAGNOSIS — N186 End stage renal disease: Secondary | ICD-10-CM | POA: Diagnosis not present

## 2017-05-20 DIAGNOSIS — N2581 Secondary hyperparathyroidism of renal origin: Secondary | ICD-10-CM | POA: Diagnosis not present

## 2017-05-20 DIAGNOSIS — Z23 Encounter for immunization: Secondary | ICD-10-CM | POA: Diagnosis not present

## 2017-05-22 DIAGNOSIS — Z992 Dependence on renal dialysis: Secondary | ICD-10-CM | POA: Diagnosis not present

## 2017-05-22 DIAGNOSIS — D509 Iron deficiency anemia, unspecified: Secondary | ICD-10-CM | POA: Diagnosis not present

## 2017-05-22 DIAGNOSIS — N186 End stage renal disease: Secondary | ICD-10-CM | POA: Diagnosis not present

## 2017-05-22 DIAGNOSIS — N2581 Secondary hyperparathyroidism of renal origin: Secondary | ICD-10-CM | POA: Diagnosis not present

## 2017-05-22 DIAGNOSIS — D631 Anemia in chronic kidney disease: Secondary | ICD-10-CM | POA: Diagnosis not present

## 2017-05-22 DIAGNOSIS — Z23 Encounter for immunization: Secondary | ICD-10-CM | POA: Diagnosis not present

## 2017-05-25 DIAGNOSIS — N186 End stage renal disease: Secondary | ICD-10-CM | POA: Diagnosis not present

## 2017-05-25 DIAGNOSIS — Z23 Encounter for immunization: Secondary | ICD-10-CM | POA: Diagnosis not present

## 2017-05-25 DIAGNOSIS — D631 Anemia in chronic kidney disease: Secondary | ICD-10-CM | POA: Diagnosis not present

## 2017-05-25 DIAGNOSIS — D509 Iron deficiency anemia, unspecified: Secondary | ICD-10-CM | POA: Diagnosis not present

## 2017-05-25 DIAGNOSIS — Z992 Dependence on renal dialysis: Secondary | ICD-10-CM | POA: Diagnosis not present

## 2017-05-25 DIAGNOSIS — N2581 Secondary hyperparathyroidism of renal origin: Secondary | ICD-10-CM | POA: Diagnosis not present

## 2017-05-27 DIAGNOSIS — N2581 Secondary hyperparathyroidism of renal origin: Secondary | ICD-10-CM | POA: Diagnosis not present

## 2017-05-27 DIAGNOSIS — D631 Anemia in chronic kidney disease: Secondary | ICD-10-CM | POA: Diagnosis not present

## 2017-05-27 DIAGNOSIS — D509 Iron deficiency anemia, unspecified: Secondary | ICD-10-CM | POA: Diagnosis not present

## 2017-05-27 DIAGNOSIS — Z23 Encounter for immunization: Secondary | ICD-10-CM | POA: Diagnosis not present

## 2017-05-27 DIAGNOSIS — Z992 Dependence on renal dialysis: Secondary | ICD-10-CM | POA: Diagnosis not present

## 2017-05-27 DIAGNOSIS — N186 End stage renal disease: Secondary | ICD-10-CM | POA: Diagnosis not present

## 2017-05-28 DIAGNOSIS — E038 Other specified hypothyroidism: Secondary | ICD-10-CM | POA: Diagnosis not present

## 2017-05-28 DIAGNOSIS — Z13220 Encounter for screening for lipoid disorders: Secondary | ICD-10-CM | POA: Diagnosis not present

## 2017-05-28 DIAGNOSIS — N186 End stage renal disease: Secondary | ICD-10-CM | POA: Diagnosis not present

## 2017-05-29 DIAGNOSIS — Z992 Dependence on renal dialysis: Secondary | ICD-10-CM | POA: Diagnosis not present

## 2017-05-29 DIAGNOSIS — N186 End stage renal disease: Secondary | ICD-10-CM | POA: Diagnosis not present

## 2017-05-29 DIAGNOSIS — D509 Iron deficiency anemia, unspecified: Secondary | ICD-10-CM | POA: Diagnosis not present

## 2017-05-29 DIAGNOSIS — Z23 Encounter for immunization: Secondary | ICD-10-CM | POA: Diagnosis not present

## 2017-05-29 DIAGNOSIS — D631 Anemia in chronic kidney disease: Secondary | ICD-10-CM | POA: Diagnosis not present

## 2017-05-29 DIAGNOSIS — N2581 Secondary hyperparathyroidism of renal origin: Secondary | ICD-10-CM | POA: Diagnosis not present

## 2017-06-01 DIAGNOSIS — Z992 Dependence on renal dialysis: Secondary | ICD-10-CM | POA: Diagnosis not present

## 2017-06-01 DIAGNOSIS — N2581 Secondary hyperparathyroidism of renal origin: Secondary | ICD-10-CM | POA: Diagnosis not present

## 2017-06-01 DIAGNOSIS — Z23 Encounter for immunization: Secondary | ICD-10-CM | POA: Diagnosis not present

## 2017-06-01 DIAGNOSIS — D631 Anemia in chronic kidney disease: Secondary | ICD-10-CM | POA: Diagnosis not present

## 2017-06-01 DIAGNOSIS — D509 Iron deficiency anemia, unspecified: Secondary | ICD-10-CM | POA: Diagnosis not present

## 2017-06-01 DIAGNOSIS — N186 End stage renal disease: Secondary | ICD-10-CM | POA: Diagnosis not present

## 2017-06-03 DIAGNOSIS — N186 End stage renal disease: Secondary | ICD-10-CM | POA: Diagnosis not present

## 2017-06-03 DIAGNOSIS — D631 Anemia in chronic kidney disease: Secondary | ICD-10-CM | POA: Diagnosis not present

## 2017-06-03 DIAGNOSIS — Z992 Dependence on renal dialysis: Secondary | ICD-10-CM | POA: Diagnosis not present

## 2017-06-03 DIAGNOSIS — D509 Iron deficiency anemia, unspecified: Secondary | ICD-10-CM | POA: Diagnosis not present

## 2017-06-03 DIAGNOSIS — N2581 Secondary hyperparathyroidism of renal origin: Secondary | ICD-10-CM | POA: Diagnosis not present

## 2017-06-03 DIAGNOSIS — Z23 Encounter for immunization: Secondary | ICD-10-CM | POA: Diagnosis not present

## 2017-06-04 DIAGNOSIS — A4902 Methicillin resistant Staphylococcus aureus infection, unspecified site: Secondary | ICD-10-CM | POA: Diagnosis not present

## 2017-06-04 DIAGNOSIS — S81802D Unspecified open wound, left lower leg, subsequent encounter: Secondary | ICD-10-CM | POA: Diagnosis not present

## 2017-06-05 DIAGNOSIS — N2581 Secondary hyperparathyroidism of renal origin: Secondary | ICD-10-CM | POA: Diagnosis not present

## 2017-06-05 DIAGNOSIS — N186 End stage renal disease: Secondary | ICD-10-CM | POA: Diagnosis not present

## 2017-06-05 DIAGNOSIS — Z23 Encounter for immunization: Secondary | ICD-10-CM | POA: Diagnosis not present

## 2017-06-05 DIAGNOSIS — D509 Iron deficiency anemia, unspecified: Secondary | ICD-10-CM | POA: Diagnosis not present

## 2017-06-05 DIAGNOSIS — D631 Anemia in chronic kidney disease: Secondary | ICD-10-CM | POA: Diagnosis not present

## 2017-06-05 DIAGNOSIS — Z992 Dependence on renal dialysis: Secondary | ICD-10-CM | POA: Diagnosis not present

## 2017-06-07 ENCOUNTER — Other Ambulatory Visit: Payer: Self-pay | Admitting: Family Medicine

## 2017-06-08 DIAGNOSIS — N2581 Secondary hyperparathyroidism of renal origin: Secondary | ICD-10-CM | POA: Diagnosis not present

## 2017-06-08 DIAGNOSIS — D631 Anemia in chronic kidney disease: Secondary | ICD-10-CM | POA: Diagnosis not present

## 2017-06-08 DIAGNOSIS — Z992 Dependence on renal dialysis: Secondary | ICD-10-CM | POA: Diagnosis not present

## 2017-06-08 DIAGNOSIS — N186 End stage renal disease: Secondary | ICD-10-CM | POA: Diagnosis not present

## 2017-06-08 DIAGNOSIS — Z23 Encounter for immunization: Secondary | ICD-10-CM | POA: Diagnosis not present

## 2017-06-08 DIAGNOSIS — D509 Iron deficiency anemia, unspecified: Secondary | ICD-10-CM | POA: Diagnosis not present

## 2017-06-09 DIAGNOSIS — J449 Chronic obstructive pulmonary disease, unspecified: Secondary | ICD-10-CM | POA: Diagnosis not present

## 2017-06-09 DIAGNOSIS — E038 Other specified hypothyroidism: Secondary | ICD-10-CM | POA: Diagnosis not present

## 2017-06-09 DIAGNOSIS — Z0000 Encounter for general adult medical examination without abnormal findings: Secondary | ICD-10-CM | POA: Diagnosis not present

## 2017-06-09 DIAGNOSIS — N186 End stage renal disease: Secondary | ICD-10-CM | POA: Diagnosis not present

## 2017-06-09 DIAGNOSIS — I10 Essential (primary) hypertension: Secondary | ICD-10-CM | POA: Diagnosis not present

## 2017-06-09 DIAGNOSIS — J9611 Chronic respiratory failure with hypoxia: Secondary | ICD-10-CM | POA: Diagnosis not present

## 2017-06-10 DIAGNOSIS — N2581 Secondary hyperparathyroidism of renal origin: Secondary | ICD-10-CM | POA: Diagnosis not present

## 2017-06-10 DIAGNOSIS — Z23 Encounter for immunization: Secondary | ICD-10-CM | POA: Diagnosis not present

## 2017-06-10 DIAGNOSIS — N186 End stage renal disease: Secondary | ICD-10-CM | POA: Diagnosis not present

## 2017-06-10 DIAGNOSIS — D509 Iron deficiency anemia, unspecified: Secondary | ICD-10-CM | POA: Diagnosis not present

## 2017-06-10 DIAGNOSIS — Z992 Dependence on renal dialysis: Secondary | ICD-10-CM | POA: Diagnosis not present

## 2017-06-10 DIAGNOSIS — D631 Anemia in chronic kidney disease: Secondary | ICD-10-CM | POA: Diagnosis not present

## 2017-06-12 DIAGNOSIS — Z992 Dependence on renal dialysis: Secondary | ICD-10-CM | POA: Diagnosis not present

## 2017-06-12 DIAGNOSIS — D509 Iron deficiency anemia, unspecified: Secondary | ICD-10-CM | POA: Diagnosis not present

## 2017-06-12 DIAGNOSIS — D631 Anemia in chronic kidney disease: Secondary | ICD-10-CM | POA: Diagnosis not present

## 2017-06-12 DIAGNOSIS — N2581 Secondary hyperparathyroidism of renal origin: Secondary | ICD-10-CM | POA: Diagnosis not present

## 2017-06-12 DIAGNOSIS — N186 End stage renal disease: Secondary | ICD-10-CM | POA: Diagnosis not present

## 2017-06-12 DIAGNOSIS — Z23 Encounter for immunization: Secondary | ICD-10-CM | POA: Diagnosis not present

## 2017-06-15 DIAGNOSIS — Z23 Encounter for immunization: Secondary | ICD-10-CM | POA: Diagnosis not present

## 2017-06-15 DIAGNOSIS — N2581 Secondary hyperparathyroidism of renal origin: Secondary | ICD-10-CM | POA: Diagnosis not present

## 2017-06-15 DIAGNOSIS — Z992 Dependence on renal dialysis: Secondary | ICD-10-CM | POA: Diagnosis not present

## 2017-06-15 DIAGNOSIS — N186 End stage renal disease: Secondary | ICD-10-CM | POA: Diagnosis not present

## 2017-06-15 DIAGNOSIS — D509 Iron deficiency anemia, unspecified: Secondary | ICD-10-CM | POA: Diagnosis not present

## 2017-06-15 DIAGNOSIS — D631 Anemia in chronic kidney disease: Secondary | ICD-10-CM | POA: Diagnosis not present

## 2017-06-17 DIAGNOSIS — Z23 Encounter for immunization: Secondary | ICD-10-CM | POA: Diagnosis not present

## 2017-06-17 DIAGNOSIS — D631 Anemia in chronic kidney disease: Secondary | ICD-10-CM | POA: Diagnosis not present

## 2017-06-17 DIAGNOSIS — N186 End stage renal disease: Secondary | ICD-10-CM | POA: Diagnosis not present

## 2017-06-17 DIAGNOSIS — Z992 Dependence on renal dialysis: Secondary | ICD-10-CM | POA: Diagnosis not present

## 2017-06-17 DIAGNOSIS — D509 Iron deficiency anemia, unspecified: Secondary | ICD-10-CM | POA: Diagnosis not present

## 2017-06-17 DIAGNOSIS — N2581 Secondary hyperparathyroidism of renal origin: Secondary | ICD-10-CM | POA: Diagnosis not present

## 2017-06-19 DIAGNOSIS — Z992 Dependence on renal dialysis: Secondary | ICD-10-CM | POA: Diagnosis not present

## 2017-06-19 DIAGNOSIS — N186 End stage renal disease: Secondary | ICD-10-CM | POA: Diagnosis not present

## 2017-06-19 DIAGNOSIS — D631 Anemia in chronic kidney disease: Secondary | ICD-10-CM | POA: Diagnosis not present

## 2017-06-19 DIAGNOSIS — Z23 Encounter for immunization: Secondary | ICD-10-CM | POA: Diagnosis not present

## 2017-06-19 DIAGNOSIS — N2581 Secondary hyperparathyroidism of renal origin: Secondary | ICD-10-CM | POA: Diagnosis not present

## 2017-06-19 DIAGNOSIS — D509 Iron deficiency anemia, unspecified: Secondary | ICD-10-CM | POA: Diagnosis not present

## 2017-06-22 DIAGNOSIS — N2581 Secondary hyperparathyroidism of renal origin: Secondary | ICD-10-CM | POA: Diagnosis not present

## 2017-06-22 DIAGNOSIS — Z23 Encounter for immunization: Secondary | ICD-10-CM | POA: Diagnosis not present

## 2017-06-22 DIAGNOSIS — N186 End stage renal disease: Secondary | ICD-10-CM | POA: Diagnosis not present

## 2017-06-22 DIAGNOSIS — D509 Iron deficiency anemia, unspecified: Secondary | ICD-10-CM | POA: Diagnosis not present

## 2017-06-22 DIAGNOSIS — D631 Anemia in chronic kidney disease: Secondary | ICD-10-CM | POA: Diagnosis not present

## 2017-06-22 DIAGNOSIS — Z992 Dependence on renal dialysis: Secondary | ICD-10-CM | POA: Diagnosis not present

## 2017-06-24 DIAGNOSIS — N2581 Secondary hyperparathyroidism of renal origin: Secondary | ICD-10-CM | POA: Diagnosis not present

## 2017-06-24 DIAGNOSIS — N186 End stage renal disease: Secondary | ICD-10-CM | POA: Diagnosis not present

## 2017-06-24 DIAGNOSIS — Z992 Dependence on renal dialysis: Secondary | ICD-10-CM | POA: Diagnosis not present

## 2017-06-24 DIAGNOSIS — D631 Anemia in chronic kidney disease: Secondary | ICD-10-CM | POA: Diagnosis not present

## 2017-06-24 DIAGNOSIS — D509 Iron deficiency anemia, unspecified: Secondary | ICD-10-CM | POA: Diagnosis not present

## 2017-06-24 DIAGNOSIS — Z23 Encounter for immunization: Secondary | ICD-10-CM | POA: Diagnosis not present

## 2017-06-26 DIAGNOSIS — N186 End stage renal disease: Secondary | ICD-10-CM | POA: Diagnosis not present

## 2017-06-26 DIAGNOSIS — Z23 Encounter for immunization: Secondary | ICD-10-CM | POA: Diagnosis not present

## 2017-06-26 DIAGNOSIS — Z992 Dependence on renal dialysis: Secondary | ICD-10-CM | POA: Diagnosis not present

## 2017-06-26 DIAGNOSIS — N2581 Secondary hyperparathyroidism of renal origin: Secondary | ICD-10-CM | POA: Diagnosis not present

## 2017-06-26 DIAGNOSIS — D631 Anemia in chronic kidney disease: Secondary | ICD-10-CM | POA: Diagnosis not present

## 2017-06-26 DIAGNOSIS — D509 Iron deficiency anemia, unspecified: Secondary | ICD-10-CM | POA: Diagnosis not present

## 2017-06-27 DIAGNOSIS — Z992 Dependence on renal dialysis: Secondary | ICD-10-CM | POA: Diagnosis not present

## 2017-06-27 DIAGNOSIS — N186 End stage renal disease: Secondary | ICD-10-CM | POA: Diagnosis not present

## 2017-06-29 DIAGNOSIS — D509 Iron deficiency anemia, unspecified: Secondary | ICD-10-CM | POA: Diagnosis not present

## 2017-06-29 DIAGNOSIS — Z23 Encounter for immunization: Secondary | ICD-10-CM | POA: Diagnosis not present

## 2017-06-29 DIAGNOSIS — N2581 Secondary hyperparathyroidism of renal origin: Secondary | ICD-10-CM | POA: Diagnosis not present

## 2017-06-29 DIAGNOSIS — N186 End stage renal disease: Secondary | ICD-10-CM | POA: Diagnosis not present

## 2017-06-29 DIAGNOSIS — D631 Anemia in chronic kidney disease: Secondary | ICD-10-CM | POA: Diagnosis not present

## 2017-06-29 DIAGNOSIS — Z992 Dependence on renal dialysis: Secondary | ICD-10-CM | POA: Diagnosis not present

## 2017-07-01 DIAGNOSIS — Z23 Encounter for immunization: Secondary | ICD-10-CM | POA: Diagnosis not present

## 2017-07-01 DIAGNOSIS — Z992 Dependence on renal dialysis: Secondary | ICD-10-CM | POA: Diagnosis not present

## 2017-07-01 DIAGNOSIS — D631 Anemia in chronic kidney disease: Secondary | ICD-10-CM | POA: Diagnosis not present

## 2017-07-01 DIAGNOSIS — N186 End stage renal disease: Secondary | ICD-10-CM | POA: Diagnosis not present

## 2017-07-01 DIAGNOSIS — N2581 Secondary hyperparathyroidism of renal origin: Secondary | ICD-10-CM | POA: Diagnosis not present

## 2017-07-01 DIAGNOSIS — D509 Iron deficiency anemia, unspecified: Secondary | ICD-10-CM | POA: Diagnosis not present

## 2017-07-02 DIAGNOSIS — H2513 Age-related nuclear cataract, bilateral: Secondary | ICD-10-CM | POA: Diagnosis not present

## 2017-07-03 DIAGNOSIS — Z23 Encounter for immunization: Secondary | ICD-10-CM | POA: Diagnosis not present

## 2017-07-03 DIAGNOSIS — D631 Anemia in chronic kidney disease: Secondary | ICD-10-CM | POA: Diagnosis not present

## 2017-07-03 DIAGNOSIS — N186 End stage renal disease: Secondary | ICD-10-CM | POA: Diagnosis not present

## 2017-07-03 DIAGNOSIS — N2581 Secondary hyperparathyroidism of renal origin: Secondary | ICD-10-CM | POA: Diagnosis not present

## 2017-07-03 DIAGNOSIS — Z992 Dependence on renal dialysis: Secondary | ICD-10-CM | POA: Diagnosis not present

## 2017-07-03 DIAGNOSIS — D509 Iron deficiency anemia, unspecified: Secondary | ICD-10-CM | POA: Diagnosis not present

## 2017-07-06 DIAGNOSIS — Z23 Encounter for immunization: Secondary | ICD-10-CM | POA: Diagnosis not present

## 2017-07-06 DIAGNOSIS — N2581 Secondary hyperparathyroidism of renal origin: Secondary | ICD-10-CM | POA: Diagnosis not present

## 2017-07-06 DIAGNOSIS — N186 End stage renal disease: Secondary | ICD-10-CM | POA: Diagnosis not present

## 2017-07-06 DIAGNOSIS — D631 Anemia in chronic kidney disease: Secondary | ICD-10-CM | POA: Diagnosis not present

## 2017-07-06 DIAGNOSIS — Z992 Dependence on renal dialysis: Secondary | ICD-10-CM | POA: Diagnosis not present

## 2017-07-06 DIAGNOSIS — D509 Iron deficiency anemia, unspecified: Secondary | ICD-10-CM | POA: Diagnosis not present

## 2017-07-08 DIAGNOSIS — N2581 Secondary hyperparathyroidism of renal origin: Secondary | ICD-10-CM | POA: Diagnosis not present

## 2017-07-08 DIAGNOSIS — Z992 Dependence on renal dialysis: Secondary | ICD-10-CM | POA: Diagnosis not present

## 2017-07-08 DIAGNOSIS — N186 End stage renal disease: Secondary | ICD-10-CM | POA: Diagnosis not present

## 2017-07-08 DIAGNOSIS — Z23 Encounter for immunization: Secondary | ICD-10-CM | POA: Diagnosis not present

## 2017-07-08 DIAGNOSIS — D509 Iron deficiency anemia, unspecified: Secondary | ICD-10-CM | POA: Diagnosis not present

## 2017-07-08 DIAGNOSIS — D631 Anemia in chronic kidney disease: Secondary | ICD-10-CM | POA: Diagnosis not present

## 2017-07-09 DIAGNOSIS — S81802A Unspecified open wound, left lower leg, initial encounter: Secondary | ICD-10-CM | POA: Diagnosis not present

## 2017-07-09 DIAGNOSIS — Z792 Long term (current) use of antibiotics: Secondary | ICD-10-CM | POA: Diagnosis not present

## 2017-07-09 DIAGNOSIS — T827XXD Infection and inflammatory reaction due to other cardiac and vascular devices, implants and grafts, subsequent encounter: Secondary | ICD-10-CM | POA: Diagnosis not present

## 2017-07-10 DIAGNOSIS — D631 Anemia in chronic kidney disease: Secondary | ICD-10-CM | POA: Diagnosis not present

## 2017-07-10 DIAGNOSIS — N186 End stage renal disease: Secondary | ICD-10-CM | POA: Diagnosis not present

## 2017-07-10 DIAGNOSIS — Z23 Encounter for immunization: Secondary | ICD-10-CM | POA: Diagnosis not present

## 2017-07-10 DIAGNOSIS — N2581 Secondary hyperparathyroidism of renal origin: Secondary | ICD-10-CM | POA: Diagnosis not present

## 2017-07-10 DIAGNOSIS — D509 Iron deficiency anemia, unspecified: Secondary | ICD-10-CM | POA: Diagnosis not present

## 2017-07-10 DIAGNOSIS — Z992 Dependence on renal dialysis: Secondary | ICD-10-CM | POA: Diagnosis not present

## 2017-07-13 DIAGNOSIS — N186 End stage renal disease: Secondary | ICD-10-CM | POA: Diagnosis not present

## 2017-07-13 DIAGNOSIS — Z23 Encounter for immunization: Secondary | ICD-10-CM | POA: Diagnosis not present

## 2017-07-13 DIAGNOSIS — Z992 Dependence on renal dialysis: Secondary | ICD-10-CM | POA: Diagnosis not present

## 2017-07-13 DIAGNOSIS — N2581 Secondary hyperparathyroidism of renal origin: Secondary | ICD-10-CM | POA: Diagnosis not present

## 2017-07-13 DIAGNOSIS — D631 Anemia in chronic kidney disease: Secondary | ICD-10-CM | POA: Diagnosis not present

## 2017-07-13 DIAGNOSIS — D509 Iron deficiency anemia, unspecified: Secondary | ICD-10-CM | POA: Diagnosis not present

## 2017-07-14 ENCOUNTER — Encounter (INDEPENDENT_AMBULATORY_CARE_PROVIDER_SITE_OTHER): Payer: Self-pay | Admitting: Vascular Surgery

## 2017-07-14 ENCOUNTER — Encounter (INDEPENDENT_AMBULATORY_CARE_PROVIDER_SITE_OTHER): Payer: Self-pay

## 2017-07-14 ENCOUNTER — Ambulatory Visit (INDEPENDENT_AMBULATORY_CARE_PROVIDER_SITE_OTHER): Payer: Medicare Other | Admitting: Vascular Surgery

## 2017-07-14 ENCOUNTER — Ambulatory Visit (INDEPENDENT_AMBULATORY_CARE_PROVIDER_SITE_OTHER): Payer: Medicare Other

## 2017-07-14 VITALS — BP 135/66 | HR 77 | Resp 14 | Ht 64.5 in | Wt 128.0 lb

## 2017-07-14 DIAGNOSIS — D638 Anemia in other chronic diseases classified elsewhere: Secondary | ICD-10-CM

## 2017-07-14 DIAGNOSIS — N186 End stage renal disease: Secondary | ICD-10-CM
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
## 2017-07-15 DIAGNOSIS — N2581 Secondary hyperparathyroidism of renal origin: Secondary | ICD-10-CM | POA: Diagnosis not present

## 2017-07-15 DIAGNOSIS — D509 Iron deficiency anemia, unspecified: Secondary | ICD-10-CM | POA: Diagnosis not present

## 2017-07-15 DIAGNOSIS — Z992 Dependence on renal dialysis: Secondary | ICD-10-CM | POA: Diagnosis not present

## 2017-07-15 DIAGNOSIS — Z23 Encounter for immunization: Secondary | ICD-10-CM | POA: Diagnosis not present

## 2017-07-15 DIAGNOSIS — N186 End stage renal disease: Secondary | ICD-10-CM | POA: Diagnosis not present

## 2017-07-15 DIAGNOSIS — D631 Anemia in chronic kidney disease: Secondary | ICD-10-CM | POA: Diagnosis not present

## 2017-07-17 DIAGNOSIS — D509 Iron deficiency anemia, unspecified: Secondary | ICD-10-CM | POA: Diagnosis not present

## 2017-07-17 DIAGNOSIS — Z23 Encounter for immunization: Secondary | ICD-10-CM | POA: Diagnosis not present

## 2017-07-17 DIAGNOSIS — N2581 Secondary hyperparathyroidism of renal origin: Secondary | ICD-10-CM | POA: Diagnosis not present

## 2017-07-17 DIAGNOSIS — D631 Anemia in chronic kidney disease: Secondary | ICD-10-CM | POA: Diagnosis not present

## 2017-07-17 DIAGNOSIS — Z992 Dependence on renal dialysis: Secondary | ICD-10-CM | POA: Diagnosis not present

## 2017-07-17 DIAGNOSIS — N186 End stage renal disease: Secondary | ICD-10-CM | POA: Diagnosis not present

## 2017-07-19 ENCOUNTER — Other Ambulatory Visit (INDEPENDENT_AMBULATORY_CARE_PROVIDER_SITE_OTHER): Payer: Self-pay | Admitting: Vascular Surgery

## 2017-07-19 DIAGNOSIS — Z992 Dependence on renal dialysis: Secondary | ICD-10-CM | POA: Diagnosis not present

## 2017-07-19 DIAGNOSIS — D631 Anemia in chronic kidney disease: Secondary | ICD-10-CM | POA: Diagnosis not present

## 2017-07-19 DIAGNOSIS — D509 Iron deficiency anemia, unspecified: Secondary | ICD-10-CM | POA: Diagnosis not present

## 2017-07-19 DIAGNOSIS — N186 End stage renal disease: Secondary | ICD-10-CM | POA: Diagnosis not present

## 2017-07-19 DIAGNOSIS — N2581 Secondary hyperparathyroidism of renal origin: Secondary | ICD-10-CM | POA: Diagnosis not present

## 2017-07-19 DIAGNOSIS — Z23 Encounter for immunization: Secondary | ICD-10-CM | POA: Diagnosis not present

## 2017-07-19 MED ORDER — CEFAZOLIN SODIUM-DEXTROSE 1-4 GM/50ML-% IV SOLN
1.0000 g | Freq: Once | INTRAVENOUS | Status: AC
  Administered 2017-07-20: 1 g via INTRAVENOUS

## 2017-07-20 ENCOUNTER — Ambulatory Visit
Admission: RE | Admit: 2017-07-20 | Discharge: 2017-07-20 | Disposition: A | Discharge status: Home / Self Care | Payer: Medicare Other | Source: Ambulatory Visit | Attending: Vascular Surgery | Admitting: Vascular Surgery

## 2017-07-20 ENCOUNTER — Encounter
Admission: RE | Disposition: A | Discharge status: Home / Self Care | Payer: Self-pay | Source: Ambulatory Visit | Attending: Vascular Surgery

## 2017-07-20 DIAGNOSIS — T82868A Thrombosis of vascular prosthetic devices, implants and grafts, initial encounter: Secondary | ICD-10-CM | POA: Diagnosis not present

## 2017-07-20 DIAGNOSIS — N186 End stage renal disease: Secondary | ICD-10-CM | POA: Diagnosis not present

## 2017-07-20 DIAGNOSIS — E538 Deficiency of other specified B group vitamins: Secondary | ICD-10-CM | POA: Diagnosis not present

## 2017-07-20 DIAGNOSIS — Z8679 Personal history of other diseases of the circulatory system: Secondary | ICD-10-CM | POA: Diagnosis not present

## 2017-07-20 DIAGNOSIS — Z8619 Personal history of other infectious and parasitic diseases: Secondary | ICD-10-CM | POA: Insufficient documentation

## 2017-07-20 DIAGNOSIS — Z7951 Long term (current) use of inhaled steroids: Secondary | ICD-10-CM | POA: Insufficient documentation

## 2017-07-20 DIAGNOSIS — Z992 Dependence on renal dialysis: Secondary | ICD-10-CM | POA: Diagnosis not present

## 2017-07-20 DIAGNOSIS — E785 Hyperlipidemia, unspecified: Secondary | ICD-10-CM | POA: Insufficient documentation

## 2017-07-20 DIAGNOSIS — Z823 Family history of stroke: Secondary | ICD-10-CM | POA: Diagnosis not present

## 2017-07-20 DIAGNOSIS — Z79899 Other long term (current) drug therapy: Secondary | ICD-10-CM | POA: Diagnosis not present

## 2017-07-20 DIAGNOSIS — Z9981 Dependence on supplemental oxygen: Secondary | ICD-10-CM | POA: Diagnosis not present

## 2017-07-20 DIAGNOSIS — D631 Anemia in chronic kidney disease: Secondary | ICD-10-CM | POA: Diagnosis not present

## 2017-07-20 DIAGNOSIS — Z90710 Acquired absence of both cervix and uterus: Secondary | ICD-10-CM | POA: Diagnosis not present

## 2017-07-20 DIAGNOSIS — I509 Heart failure, unspecified: Secondary | ICD-10-CM | POA: Diagnosis not present

## 2017-07-20 DIAGNOSIS — T82858A Stenosis of vascular prosthetic devices, implants and grafts, initial encounter: Secondary | ICD-10-CM | POA: Diagnosis not present

## 2017-07-20 DIAGNOSIS — Z951 Presence of aortocoronary bypass graft: Secondary | ICD-10-CM | POA: Insufficient documentation

## 2017-07-20 DIAGNOSIS — I132 Hypertensive heart and chronic kidney disease with heart failure and with stage 5 chronic kidney disease, or end stage renal disease: Secondary | ICD-10-CM | POA: Diagnosis not present

## 2017-07-20 DIAGNOSIS — Z79890 Hormone replacement therapy (postmenopausal): Secondary | ICD-10-CM | POA: Diagnosis not present

## 2017-07-20 DIAGNOSIS — E079 Disorder of thyroid, unspecified: Secondary | ICD-10-CM | POA: Diagnosis not present

## 2017-07-20 DIAGNOSIS — J449 Chronic obstructive pulmonary disease, unspecified: Secondary | ICD-10-CM | POA: Insufficient documentation

## 2017-07-20 DIAGNOSIS — K219 Gastro-esophageal reflux disease without esophagitis: Secondary | ICD-10-CM | POA: Diagnosis not present

## 2017-07-20 DIAGNOSIS — Y832 Surgical operation with anastomosis, bypass or graft as the cause of abnormal reaction of the patient, or of later complication, without mention of misadventure at the time of the procedure: Secondary | ICD-10-CM | POA: Diagnosis not present

## 2017-07-20 DIAGNOSIS — Z87891 Personal history of nicotine dependence: Secondary | ICD-10-CM | POA: Insufficient documentation

## 2017-07-20 DIAGNOSIS — Z888 Allergy status to other drugs, medicaments and biological substances status: Secondary | ICD-10-CM | POA: Insufficient documentation

## 2017-07-20 DIAGNOSIS — Z886 Allergy status to analgesic agent status: Secondary | ICD-10-CM | POA: Insufficient documentation

## 2017-07-20 DIAGNOSIS — Z9889 Other specified postprocedural states: Secondary | ICD-10-CM | POA: Insufficient documentation

## 2017-07-20 DIAGNOSIS — Z8249 Family history of ischemic heart disease and other diseases of the circulatory system: Secondary | ICD-10-CM | POA: Insufficient documentation

## 2017-07-20 HISTORY — PX: A/V FISTULAGRAM: CATH118298

## 2017-07-20 LAB — POTASSIUM (ARMC VASCULAR LAB ONLY): Potassium (ARMC vascular lab): 3.6 (ref 3.5–5.1)

## 2017-07-20 SURGERY — A/V FISTULAGRAM
Anesthesia: Moderate Sedation | Laterality: Left

## 2017-07-20 MED ORDER — FAMOTIDINE 20 MG PO TABS: 40.0000 mg | ORAL_TABLET | ORAL | Status: DC | PRN

## 2017-07-20 MED ORDER — LIDOCAINE HCL (PF) 1 % IJ SOLN
INTRAMUSCULAR | Status: AC
  Filled 2017-07-20: qty 30

## 2017-07-20 MED ORDER — SODIUM CHLORIDE 0.9 % IV SOLN: INTRAVENOUS | Status: DC

## 2017-07-20 MED ORDER — FENTANYL CITRATE (PF) 100 MCG/2ML IJ SOLN
INTRAMUSCULAR | Status: AC
  Filled 2017-07-20: qty 2

## 2017-07-20 MED ORDER — MIDAZOLAM HCL 2 MG/2ML IJ SOLN
INTRAMUSCULAR | Status: DC | PRN
  Administered 2017-07-20: 1 mg via INTRAVENOUS
  Administered 2017-07-20: 2 mg via INTRAVENOUS

## 2017-07-20 MED ORDER — ONDANSETRON HCL 4 MG/2ML IJ SOLN: 4.0000 mg | Freq: Four times a day (QID) | INTRAMUSCULAR | Status: DC | PRN

## 2017-07-20 MED ORDER — METHYLPREDNISOLONE SODIUM SUCC 125 MG IJ SOLR: 125.0000 mg | INTRAMUSCULAR | Status: DC | PRN

## 2017-07-20 MED ORDER — FENTANYL CITRATE (PF) 100 MCG/2ML IJ SOLN
INTRAMUSCULAR | Status: DC | PRN
  Administered 2017-07-20 (×2): 50 ug via INTRAVENOUS

## 2017-07-20 MED ORDER — HEPARIN SODIUM (PORCINE) 1000 UNIT/ML IJ SOLN
INTRAMUSCULAR | Status: AC
  Filled 2017-07-20: qty 1

## 2017-07-20 MED ORDER — HYDROMORPHONE HCL 1 MG/ML IJ SOLN: 1.0000 mg | Freq: Once | INTRAMUSCULAR | Status: DC | PRN

## 2017-07-20 MED ORDER — MIDAZOLAM HCL 5 MG/5ML IJ SOLN
INTRAMUSCULAR | Status: AC
  Filled 2017-07-20: qty 5

## 2017-07-20 SURGICAL SUPPLY — 17 items
BALLN DORADO 10X80X80 (BALLOONS) ×3
BALLN DORADO7X100X80 (BALLOONS) ×3
BALLN LUTONIX AV 9X40X75 (BALLOONS) ×3
BALLN LUTONIX AV 9X60X75 (BALLOONS) ×3
BALLOON DORADO 10X80X80 (BALLOONS) IMPLANT
BALLOON DORADO7X100X80 (BALLOONS) IMPLANT
BALLOON LUTONIX AV 9X40X75 (BALLOONS) IMPLANT
BALLOON LUTONIX AV 9X60X75 (BALLOONS) IMPLANT
DEVICE PRESTO INFLATION (MISCELLANEOUS) ×2 IMPLANT
DRAPE BRACHIAL (DRAPES) ×2 IMPLANT
NDL ENTRY 21GA 7CM ECHOTIP (NEEDLE) IMPLANT
NEEDLE ENTRY 21GA 7CM ECHOTIP (NEEDLE) ×3 IMPLANT
PACK ANGIOGRAPHY (CUSTOM PROCEDURE TRAY) ×3 IMPLANT
SET INTRO CAPELLA COAXIAL (SET/KITS/TRAYS/PACK) ×2 IMPLANT
SHEATH BRITE TIP 6FRX5.5 (SHEATH) ×2 IMPLANT
SUT MNCRL AB 4-0 PS2 18 (SUTURE) ×4 IMPLANT
WIRE MAGIC TORQUE 260C (WIRE) ×2 IMPLANT

## 2017-07-20 NOTE — Op Note (Addendum)
OPERATIVE NOTE   PROCEDURE: 1. Contrast injection left brachial axillary AV access 2. Percutaneous transluminal angioplasty left axillary vein at the venous outflow of the AV graft with Lutonix drug-eluting balloon  PRE-OPERATIVE DIAGNOSIS: Complication of dialysis access                                                       End Stage Renal Disease  POST-OPERATIVE DIAGNOSIS: same as above   SURGEON: Katha Cabal, M.D.  ANESTHESIA: Conscious sedation was administered under my direct supervision by the interventional radiology RN. IV Versed plus fentanyl were utilized. Continuous ECG, pulse oximetry and blood pressure was monitored throughout the entire procedure.  Conscious sedation was for a total of 35.  ESTIMATED BLOOD LOSS: minimal  FINDING(S): Stricture of the AV graft  SPECIMEN(S):  None  CONTRAST: 25 cc  FLUOROSCOPY TIME: 1.9 minutes  INDICATIONS: [name] is a 67 y.o. female who  presents with malfunctioning left brachial axillary AV access.  The patient is scheduled for angiography with possible intervention of the AV access.  The patient is aware the risks include but are not limited to: bleeding, infection, thrombosis of the cannulated access, and possible anaphylactic reaction to the contrast.  The patient acknowledges if the access can not be salvaged a tunneled catheter will be needed and will be placed during this procedure.  The patient is aware of the risks of the procedure and elects to proceed with the angiogram and intervention.  DESCRIPTION: After full informed written consent was obtained, the patient was brought back to the Special Procedure suite and placed supine position.  Appropriate cardiopulmonary monitors were placed.  The left arm was prepped and draped in the standard fashion.  Appropriate timeout is called. The left brachial axillary AV graft was cannulated with a micropuncture needle.  Cannulation was performed with ultrasound guidance.  Ultrasound was placed in a sterile sleeve, the AV access was interrogated and noted to be echolucent and compressible indicating patency. Image was recorded for the permanent record. The puncture is performed under continuous ultrasound visualization.   The microwire was advanced and the needle was exchanged for  a microsheath.  The J-wire was then advanced and a 6 Fr sheath inserted.  Hand injections were completed to image the access from the arterial anastomosis through the entire access.  The central venous structures were also imaged by hand injections.  Based on the images, greater than 90% stenosis was noted in the venous outflow.  No heparin was given because the patient has documented HIT syndrome.  A Magic torque wire was negotiated through the strictures within the venous portion of the graft.  A 7 mm x 60 mm Dorado balloon was used first followed by a 9 mm x 60 mm Lutonix drug-eluting balloon and then a 9 mm x 40 mm Lutonix drug-eluting balloon.  Inflation was to 14 atm for 1 minute.  After this a 10 mm Dorado balloon was utilized inflation was for 42mnute to 20 atm.  Follow-up imaging demonstrates complete resolution of the stricture, which is to say there is less than 5% residual stenosis, with rapid flow of contrast through the graft, the central venous anatomy is preserved.  A 4-0 Monocryl purse-string suture was sewn around the sheath.  The sheath was removed and light pressure was applied.  A sterile bandage was applied to the puncture site.    COMPLICATIONS: None  CONDITION: [name], M.D [facility] Office: [phone]  07/20/2017 2:23 PM

## 2017-07-20 NOTE — H&P (Signed)
[facility] History & Physical Update  The patient was interviewed and re-examined.  The patient's previous History and Physical has been reviewed and is unchanged.  There is no change in the plan of care. We plan to proceed with the scheduled procedure.  Hortencia Pilar, MD  07/20/2017, 12:08 PM

## 2017-07-21 ENCOUNTER — Encounter: Payer: Self-pay | Admitting: Vascular Surgery

## 2017-07-22 DIAGNOSIS — N2581 Secondary hyperparathyroidism of renal origin: Secondary | ICD-10-CM | POA: Diagnosis not present

## 2017-07-22 DIAGNOSIS — D631 Anemia in chronic kidney disease: Secondary | ICD-10-CM | POA: Diagnosis not present

## 2017-07-22 DIAGNOSIS — N186 End stage renal disease: Secondary | ICD-10-CM | POA: Diagnosis not present

## 2017-07-22 DIAGNOSIS — Z992 Dependence on renal dialysis: Secondary | ICD-10-CM | POA: Diagnosis not present

## 2017-07-22 DIAGNOSIS — D509 Iron deficiency anemia, unspecified: Secondary | ICD-10-CM | POA: Diagnosis not present

## 2017-07-22 DIAGNOSIS — Z23 Encounter for immunization: Secondary | ICD-10-CM | POA: Diagnosis not present

## 2017-07-24 DIAGNOSIS — N186 End stage renal disease: Secondary | ICD-10-CM | POA: Diagnosis not present

## 2017-07-24 DIAGNOSIS — D509 Iron deficiency anemia, unspecified: Secondary | ICD-10-CM | POA: Diagnosis not present

## 2017-07-24 DIAGNOSIS — D631 Anemia in chronic kidney disease: Secondary | ICD-10-CM | POA: Diagnosis not present

## 2017-07-24 DIAGNOSIS — N2581 Secondary hyperparathyroidism of renal origin: Secondary | ICD-10-CM | POA: Diagnosis not present

## 2017-07-24 DIAGNOSIS — Z23 Encounter for immunization: Secondary | ICD-10-CM | POA: Diagnosis not present

## 2017-07-24 DIAGNOSIS — Z992 Dependence on renal dialysis: Secondary | ICD-10-CM | POA: Diagnosis not present

## 2017-07-27 DIAGNOSIS — D631 Anemia in chronic kidney disease: Secondary | ICD-10-CM | POA: Diagnosis not present

## 2017-07-27 DIAGNOSIS — Z992 Dependence on renal dialysis: Secondary | ICD-10-CM | POA: Diagnosis not present

## 2017-07-27 DIAGNOSIS — D509 Iron deficiency anemia, unspecified: Secondary | ICD-10-CM | POA: Diagnosis not present

## 2017-07-27 DIAGNOSIS — N2581 Secondary hyperparathyroidism of renal origin: Secondary | ICD-10-CM | POA: Diagnosis not present

## 2017-07-27 DIAGNOSIS — N186 End stage renal disease: Secondary | ICD-10-CM | POA: Diagnosis not present

## 2017-07-27 DIAGNOSIS — Z23 Encounter for immunization: Secondary | ICD-10-CM | POA: Diagnosis not present

## 2017-07-29 DIAGNOSIS — D631 Anemia in chronic kidney disease: Secondary | ICD-10-CM | POA: Diagnosis not present

## 2017-07-29 DIAGNOSIS — N186 End stage renal disease: Secondary | ICD-10-CM | POA: Diagnosis not present

## 2017-07-29 DIAGNOSIS — N2581 Secondary hyperparathyroidism of renal origin: Secondary | ICD-10-CM | POA: Diagnosis not present

## 2017-07-29 DIAGNOSIS — Z992 Dependence on renal dialysis: Secondary | ICD-10-CM | POA: Diagnosis not present

## 2017-07-29 DIAGNOSIS — D509 Iron deficiency anemia, unspecified: Secondary | ICD-10-CM | POA: Diagnosis not present

## 2017-07-31 ENCOUNTER — Other Ambulatory Visit
Admission: RE | Admit: 2017-07-31 | Discharge: 2017-07-31 | Disposition: A | Discharge status: Home / Self Care | Payer: Medicare Other | Source: Ambulatory Visit | Attending: Nephrology | Admitting: Nephrology

## 2017-07-31 DIAGNOSIS — N186 End stage renal disease: Secondary | ICD-10-CM | POA: Diagnosis not present

## 2017-07-31 LAB — POTASSIUM: Potassium: 4.2 mmol/L (ref 3.5–5.1)

## 2017-08-02 ENCOUNTER — Ambulatory Visit (INDEPENDENT_AMBULATORY_CARE_PROVIDER_SITE_OTHER): Payer: Medicare Other | Admitting: Vascular Surgery

## 2017-08-02 ENCOUNTER — Other Ambulatory Visit (INDEPENDENT_AMBULATORY_CARE_PROVIDER_SITE_OTHER): Payer: Self-pay | Admitting: Vascular Surgery

## 2017-08-02 ENCOUNTER — Ambulatory Visit
Admission: RE | Admit: 2017-08-02 | Discharge: 2017-08-02 | Disposition: A | Discharge status: Home / Self Care | Payer: Medicare Other | Source: Ambulatory Visit | Attending: Vascular Surgery | Admitting: Vascular Surgery

## 2017-08-02 ENCOUNTER — Ambulatory Visit (INDEPENDENT_AMBULATORY_CARE_PROVIDER_SITE_OTHER): Payer: Medicare Other

## 2017-08-02 ENCOUNTER — Encounter (INDEPENDENT_AMBULATORY_CARE_PROVIDER_SITE_OTHER): Payer: Self-pay

## 2017-08-02 ENCOUNTER — Encounter (INDEPENDENT_AMBULATORY_CARE_PROVIDER_SITE_OTHER): Payer: Self-pay | Admitting: Vascular Surgery

## 2017-08-02 ENCOUNTER — Encounter
Admission: RE | Disposition: A | Discharge status: Home / Self Care | Payer: Self-pay | Source: Ambulatory Visit | Attending: Vascular Surgery

## 2017-08-02 VITALS — BP 155/77 | HR 80 | Resp 16 | Ht 64.5 in | Wt 128.0 lb

## 2017-08-02 DIAGNOSIS — Z79890 Hormone replacement therapy (postmenopausal): Secondary | ICD-10-CM | POA: Diagnosis not present

## 2017-08-02 DIAGNOSIS — I132 Hypertensive heart and chronic kidney disease with heart failure and with stage 5 chronic kidney disease, or end stage renal disease: Secondary | ICD-10-CM | POA: Diagnosis not present

## 2017-08-02 DIAGNOSIS — Z79899 Other long term (current) drug therapy: Secondary | ICD-10-CM | POA: Diagnosis not present

## 2017-08-02 DIAGNOSIS — Z992 Dependence on renal dialysis: Secondary | ICD-10-CM | POA: Diagnosis not present

## 2017-08-02 DIAGNOSIS — D7582 Heparin induced thrombocytopenia (HIT): Secondary | ICD-10-CM | POA: Diagnosis not present

## 2017-08-02 DIAGNOSIS — D631 Anemia in chronic kidney disease: Secondary | ICD-10-CM | POA: Insufficient documentation

## 2017-08-02 DIAGNOSIS — T829XXS Unspecified complication of cardiac and vascular prosthetic device, implant and graft, sequela: Secondary | ICD-10-CM

## 2017-08-02 DIAGNOSIS — N186 End stage renal disease: Secondary | ICD-10-CM | POA: Insufficient documentation

## 2017-08-02 DIAGNOSIS — I739 Peripheral vascular disease, unspecified: Secondary | ICD-10-CM

## 2017-08-02 DIAGNOSIS — E785 Hyperlipidemia, unspecified: Secondary | ICD-10-CM

## 2017-08-02 DIAGNOSIS — D509 Iron deficiency anemia, unspecified: Secondary | ICD-10-CM | POA: Diagnosis not present

## 2017-08-02 DIAGNOSIS — Z87891 Personal history of nicotine dependence: Secondary | ICD-10-CM | POA: Diagnosis not present

## 2017-08-02 DIAGNOSIS — D75829 Heparin-induced thrombocytopenia, unspecified: Secondary | ICD-10-CM

## 2017-08-02 DIAGNOSIS — T8249XA Other complication of vascular dialysis catheter, initial encounter: Secondary | ICD-10-CM | POA: Diagnosis not present

## 2017-08-02 DIAGNOSIS — I713 Abdominal aortic aneurysm, ruptured, unspecified: Secondary | ICD-10-CM

## 2017-08-02 DIAGNOSIS — I5032 Chronic diastolic (congestive) heart failure: Secondary | ICD-10-CM | POA: Insufficient documentation

## 2017-08-02 DIAGNOSIS — N2581 Secondary hyperparathyroidism of renal origin: Secondary | ICD-10-CM | POA: Diagnosis not present

## 2017-08-02 DIAGNOSIS — J449 Chronic obstructive pulmonary disease, unspecified: Secondary | ICD-10-CM | POA: Insufficient documentation

## 2017-08-02 DIAGNOSIS — E039 Hypothyroidism, unspecified: Secondary | ICD-10-CM | POA: Diagnosis not present

## 2017-08-02 DIAGNOSIS — Z886 Allergy status to analgesic agent status: Secondary | ICD-10-CM | POA: Insufficient documentation

## 2017-08-02 DIAGNOSIS — I82C11 Acute embolism and thrombosis of right internal jugular vein: Secondary | ICD-10-CM | POA: Diagnosis not present

## 2017-08-02 HISTORY — PX: DIALYSIS/PERMA CATHETER INSERTION: CATH118288

## 2017-08-02 LAB — POTASSIUM (ARMC VASCULAR LAB ONLY): Potassium (ARMC vascular lab): 4.4 (ref 3.5–5.1)

## 2017-08-02 SURGERY — DIALYSIS/PERMA CATHETER INSERTION
Anesthesia: Moderate Sedation

## 2017-08-02 MED ORDER — MIDAZOLAM HCL 5 MG/5ML IJ SOLN
INTRAMUSCULAR | Status: AC
  Filled 2017-08-02: qty 5

## 2017-08-02 MED ORDER — TRAMADOL HCL 50 MG PO TABS: 50.0000 mg | ORAL_TABLET | Freq: Four times a day (QID) | ORAL | 0 refills | Status: DC | PRN

## 2017-08-02 MED ORDER — LIDOCAINE-EPINEPHRINE (PF) 1 %-1:200000 IJ SOLN
INTRAMUSCULAR | Status: AC
  Filled 2017-08-02: qty 30

## 2017-08-02 MED ORDER — FENTANYL CITRATE (PF) 100 MCG/2ML IJ SOLN
INTRAMUSCULAR | Status: AC
  Filled 2017-08-02: qty 2

## 2017-08-02 MED ORDER — ALTEPLASE 2 MG IJ SOLR
INTRAMUSCULAR | Status: AC
  Filled 2017-08-02: qty 4

## 2017-08-02 MED ORDER — SODIUM CHLORIDE 0.9 % IV SOLN
INTRAVENOUS | Status: DC
  Administered 2017-08-02: 11:00:00 via INTRAVENOUS

## 2017-08-02 MED ORDER — CEFAZOLIN SODIUM-DEXTROSE 1-4 GM/50ML-% IV SOLN: 1.0000 g | Freq: Once | INTRAVENOUS | Status: DC

## 2017-08-02 MED ORDER — FENTANYL CITRATE (PF) 100 MCG/2ML IJ SOLN
INTRAMUSCULAR | Status: DC | PRN
  Administered 2017-08-02: 25 ug via INTRAVENOUS
  Administered 2017-08-02: 50 ug via INTRAVENOUS
  Administered 2017-08-02 (×2): 25 ug via INTRAVENOUS

## 2017-08-02 MED ORDER — HYDROCODONE-ACETAMINOPHEN 5-325 MG PO TABS
ORAL_TABLET | ORAL | Status: AC
  Administered 2017-08-02: 1
  Filled 2017-08-02: qty 1

## 2017-08-02 MED ORDER — HYDROCODONE-ACETAMINOPHEN 5-325 MG PO TABS: 1.0000 | ORAL_TABLET | Freq: Once | ORAL | Status: DC

## 2017-08-02 MED ORDER — FAMOTIDINE 20 MG PO TABS: 40.0000 mg | ORAL_TABLET | ORAL | Status: DC | PRN

## 2017-08-02 MED ORDER — HYDROMORPHONE HCL 1 MG/ML IJ SOLN: 1.0000 mg | Freq: Once | INTRAMUSCULAR | Status: DC | PRN

## 2017-08-02 MED ORDER — HEPARIN SODIUM (PORCINE) 10000 UNIT/ML IJ SOLN
INTRAMUSCULAR | Status: AC
  Filled 2017-08-02: qty 1

## 2017-08-02 MED ORDER — ONDANSETRON HCL 4 MG/2ML IJ SOLN: 4.0000 mg | Freq: Four times a day (QID) | INTRAMUSCULAR | Status: DC | PRN

## 2017-08-02 MED ORDER — MIDAZOLAM HCL 2 MG/2ML IJ SOLN
INTRAMUSCULAR | Status: DC | PRN
  Administered 2017-08-02: 1 mg via INTRAVENOUS
  Administered 2017-08-02: 2 mg via INTRAVENOUS
  Administered 2017-08-02 (×2): 1 mg via INTRAVENOUS

## 2017-08-02 MED ORDER — HEPARIN (PORCINE) IN NACL 1000-0.9 UT/500ML-% IV SOLN
INTRAVENOUS | Status: AC
  Filled 2017-08-02: qty 500

## 2017-08-02 MED ORDER — METHYLPREDNISOLONE SODIUM SUCC 125 MG IJ SOLR: 125.0000 mg | INTRAMUSCULAR | Status: DC | PRN

## 2017-08-02 SURGICAL SUPPLY — 17 items
ADH SKN CLS APL DERMABOND .7 (GAUZE/BANDAGES/DRESSINGS) ×1
BALLN ULTRVRSE 8X100X75 (BALLOONS) ×3
BALLOON ULTRVRSE 8X100X75 (BALLOONS) IMPLANT
CANNULA 5F STIFF (CANNULA) ×3 IMPLANT
CATH BEACON 5 .035 40 KMP TP (CATHETERS) ×1 IMPLANT
CATH BEACON 5 .035 65 KMP TIP (CATHETERS) ×3 IMPLANT
CATH BEACON 5 .038 40 KMP TP (CATHETERS) ×2
CATH PALINDROME RT-P 15FX19CM (CATHETERS) ×1 IMPLANT
CATH PALINDROME RT-P 15FX23CM (CATHETERS) ×3 IMPLANT
DERMABOND ADVANCED (GAUZE/BANDAGES/DRESSINGS) ×2
DERMABOND ADVANCED .7 DNX12 (GAUZE/BANDAGES/DRESSINGS) ×1 IMPLANT
DEVICE PRESTO INFLATION (MISCELLANEOUS) ×2 IMPLANT
PACK ANGIOGRAPHY (CUSTOM PROCEDURE TRAY) ×2 IMPLANT
SHEATH BRITE TIP 8FRX11 (SHEATH) ×3 IMPLANT
SUT MNCRL AB 4-0 PS2 18 (SUTURE) ×3 IMPLANT
SUT PROLENE 0 CT 1 30 (SUTURE) ×3 IMPLANT
WIRE MAGIC TOR.035 180C (WIRE) ×3 IMPLANT
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
## 2017-08-02 NOTE — H&P (Signed)
[facility] History & Physical Update  The patient was interviewed and re-examined.  The patient's previous History and Physical has been reviewed and is unchanged.  The patient has a clotted access and needs to get a catheter for dialysis over the next several days.  A PermCath was replaced.  Her potassium was okay today.  Leotis Pain, MD  08/02/2017, 1:59 PM

## 2017-08-02 NOTE — Progress Notes (Signed)
Patient post  [facility] cath insertion with Dr Lucky Cowboy, husband at bedside. DR Lucky Cowboy out to speak with patient and husband with questions answered. Given norco 1 tablet for neck pain at insertion site of perm cath. Pt to go to dialysis on [place], [place] after recovery here, pre arranged before  Patient's procedure.

## 2017-08-02 NOTE — Discharge Instructions (Signed)
Tunneled Catheter Insertion, Care After °Refer to this sheet in the next few weeks. These instructions provide you with information about caring for yourself after your procedure. Your health care provider may also give you more specific instructions. Your treatment has been planned according to current medical practices, but problems sometimes occur. Call your health care provider if you have any problems or questions after your procedure. °What can I expect after the procedure? °After the procedure, it is common to have: °· Some mild redness, swelling, and pain around your catheter site. °· A small amount of blood or clear fluid coming from your incisions. ° °Follow these instructions at home: °Incision care °· Check your incision areas every day for signs of infection. Check for: °? More redness, swelling, or pain. °? More fluid or blood. °? Warmth. °? Pus or a bad smell. °· Follow instructions from your health care provider about how to take care of your incisions. Make sure you: °? Wash your hands with soap and water before you change your bandages (dressings). If soap and water are not available, use hand sanitizer. °? Change your dressings as told by your health care provider. Wash the area around your incisions with a germ-killing (antiseptic) solution when you change your dressing, as told by your health care provider. °? Leave stitches (sutures), skin glue, or adhesive strips in place. These skin closures may need to stay in place for 2 weeks or longer. If adhesive strip edges start to loosen and curl up, you may trim the loose edges. Do not remove adhesive strips completely unless your health care provider tells you to do that. °Catheter Care ° °· Wash your hands with soap and water before and after caring for your catheter. If soap and water are not available, use hand sanitizer. °· Keep your catheter site and your dressings clean and dry. °· Apply an antibiotic ointment to your catheter site as told by  your health care provider. °· Flush your catheter as told by your health care provider. This helps prevent it from becoming clogged. °· Do not open the caps on the ends of the catheter. °· Do not pull on your catheter. °· If your catheter is in your arm: °? Avoid wearing tight clothes or tight jewelry on your arm that has the catheter. °? Do not sleep with your head on the arm that has the catheter. °? Do not allow your blood pressure to be taken on the arm that has the catheter. °? Do not allow your blood to be drawn from the arm that has the catheter, except through the catheter itself. °Medicines °· Take over-the-counter and prescription medicines only as told by your health care provider. °· If you were prescribed an antibiotic medicine, take it as told by your health care provider. Do not stop taking the antibiotic even if you start to feel better. °Activity °· Return to your normal activities as told by your health care provider. Ask your health care provider what activities are safe for you. °· Do not lift anything that is heavier than 10 lb (4.5 kg) for 3 weeks or as long as told by your health care provider. °Driving °· Do not drive until your health care provider approves. °· Do not drive or operate heavy machinery while taking prescription pain medicine. °General instructions °· Follow your health care provider's specific instructions for the type of catheter that you have. °· Do not take baths, swim, or use a hot tub until your health   care provider approves. °· Follow instructions from your health care provider about eating or drinking restrictions. °· Wear compression stockings as told by your health care provider. These stockings help to prevent blood clots and reduce swelling in your legs. °· Keep all follow-up visits as told by your health care provider. This is important. °Contact a health care provider if: °· You have more fluid or blood coming from your incisions. °· You have more redness,  swelling, or pain at your incisions or around the area where your catheter is inserted. °· Your incisions feel warm to the touch. °· You feel unusually weak. °· You feel nauseous. °· Your catheter is not working properly. °· You have blood or fluid draining from your catheter. °· You are unable to flush your catheter. °Get help right away if: °· Your catheter breaks. °· A hole develops in your catheter. °· Your catheter comes loose or gets pulled completely out. If this happens, press on your catheter site firmly with your hand or a clean cloth until you get medical help. °· Your catheter becomes blocked. °· You have swelling in your arm, shoulder, neck, or face. °· You develop chest pain. °· You have difficulty breathing. °· You feel dizzy or light-headed. °· You have pus or a bad smell coming from your incisions. °· You have a fever. °· You develop bleeding from your catheter or your insertion site, and your bleeding does not stop. °This information is not intended to replace advice given to you by your health care provider. Make sure you discuss any questions you have with your health care provider. °Document Released: 03/02/2012 Document Revised: 11/17/2015 Document Reviewed: 12/10/2014 °Elsevier Interactive Patient Education © 2018 [employer] °Moderate Conscious Sedation, Adult, Care After °These instructions provide you with information about caring for yourself after your procedure. Your health care provider may also give you more specific instructions. Your treatment has been planned according to current medical practices, but problems sometimes occur. Call your health care provider if you have any problems or questions after your procedure. °What can I expect after the procedure? °After your procedure, it is common: °· To feel sleepy for several hours. °· To feel clumsy and have poor balance for several hours. °· To have poor judgment for several hours. °· To vomit if you eat too soon. ° °Follow these  instructions at home: °For at least 24 hours after the procedure: ° °· Do not: °? Participate in activities where you could fall or become injured. °? Drive. °? Use heavy machinery. °? Drink alcohol. °? Take sleeping pills or medicines that cause drowsiness. °? Make important decisions or sign legal documents. °? Take care of children on your own. °· Rest. °Eating and drinking °· Follow the diet recommended by your health care provider. °· If you vomit: °? Drink water, juice, or soup when you can drink without vomiting. °? Make sure you have little or no nausea before eating solid foods. °General instructions °· Have a responsible adult stay with you until you are awake and alert. °· Take over-the-counter and prescription medicines only as told by your health care provider. °· If you smoke, do not smoke without supervision. °· Keep all follow-up visits as told by your health care provider. This is important. °Contact a health care provider if: °· You keep feeling nauseous or you keep vomiting. °· You feel light-headed. °· You develop a rash. °· You have a fever. °Get help right away if: °· You have trouble   breathing. °This information is not intended to replace advice given to you by your health care provider. Make sure you discuss any questions you have with your health care provider. °Document Released: 01/04/2013 Document Revised: 08/19/2015 Document Reviewed: 07/06/2015 °Elsevier Interactive Patient Education © 2018 [employer] ° °

## 2017-08-02 NOTE — Op Note (Addendum)
OPERATIVE NOTE    PRE-OPERATIVE DIAGNOSIS: 1. ESRD   POST-OPERATIVE DIAGNOSIS: same as above  PROCEDURE: 1. Ultrasound guidance for vascular access to the left internal jugular vein 2. Left jugular venogram and superior venacavogram 3. Percutaneous transluminal angioplasty of the left jugular vein and innominate vein with 8 mm diameter by 10 cm length angioplasty balloon 4. Fluoroscopic guidance for placement of catheter 5. Placement of a 23 cm tip to cuff tunneled hemodialysis catheter via the left internal jugular vein  SURGEON: Leotis Pain, MD  ANESTHESIA:  Local with Moderate conscious sedation for approximately 30 minutes using 5 mg of Versed and 125 Mcg of Fentanyl  ESTIMATED BLOOD LOSS: 50 cc  FLUORO TIME: 1.7 minutes  CONTRAST: 10 cc  FINDING(S): 1.  Patent left internal jugular vein  SPECIMEN(S):  None  INDICATIONS:   [name] is a 67 y.o. female who presents with renal failure and a clotted access having not had dialysis in several days.  We are going to place a PermCath and later attempt a declot but she needs immediate dialysis access, and a Permcath is necessary.  Risks and benefits are discussed and informed consent is obtained.    DESCRIPTION: After obtaining full informed written consent, the patient was brought back to the vascular suited. The patient's left neck and chest were sterilely prepped and draped in a sterile surgical field was created. Moderate conscious sedation was administered during a face to face encounter with the patient throughout the procedure with my supervision of the RN administering medicines and monitoring the patient's vital signs, pulse oximetry, telemetry and mental status throughout from the start of the procedure until the patient was taken to the recovery room.  The left internal jugular vein was visualized with ultrasound and found to be patent. It was then accessed under direct ultrasound guidance and a permanent image was  recorded. A wire was placed but would not track without difficulty.  We had to use a micropuncture wire.  We were able to exchanged for a micropuncture sheath and then a J wire.  We placed a dilator over this wire, and initially the smaller dilator passed but the peel-away sheath would not pass.  Imaging was then performed after an 8 French sheath was placed which showed high-grade stenosis in the jugular vein of greater than 80% tracking into the innominate vein.  It appeared as if the superior vena cava was patent.  I then used a 40 cm Kumpe catheter and exchange the J-wire for a longer Magic torque wire after advancing the catheter into the inferior vena cava I elected to perform balloon angioplasty to open a channel to place the catheter.  And 8 mm diameter by 10 cm length angioplasty balloon was then used to treat the innominate vein and jugular vein.  This was inflated to 10 atm and a very tight waist was seen at the clavicle predominantly in the jugular vein just before the innominate confluence.  Completion venogram showed marked improvement with only about a 30% stenosis at this point. After skin nick and dilatation, the peel-away sheath was placed over the wire. I then turned my attention to an area under the clavicle. Approximately 1-2 fingerbreadths below the clavicle a small counterincision was created and tunneled from the subclavicular incision to the access site. Using fluoroscopic guidance, a 23 centimeter tip to cuff tunneled hemodialysis catheter was selected, and tunneled from the subclavicular incision to the access site. It was then placed through the peel-away sheath and  the peel-away sheath was removed. Using fluoroscopic guidance the catheter tips were parked in the right atrium. The appropriate distal connectors were placed. It withdrew blood well and flushed easily with saline and a tpa solution was then placed due to her heparin allergy. It was secured to the chest wall with 2 Prolene  sutures. The access incision was closed single 4-0 Monocryl. A 4-0 Monocryl pursestring suture was placed around the exit site. Sterile dressings were placed. The patient tolerated the procedure well and was taken to the recovery room in stable condition.  COMPLICATIONS: None  CONDITION: Stable  Leotis Pain  08/02/2017, 3:14 PM   This note was created with Dragon Medical transcription system. Any errors in dictation are purely unintentional.

## 2017-08-03 ENCOUNTER — Encounter: Payer: Self-pay | Admitting: Vascular Surgery

## 2017-08-03 DIAGNOSIS — N186 End stage renal disease: Secondary | ICD-10-CM | POA: Diagnosis not present

## 2017-08-03 DIAGNOSIS — D631 Anemia in chronic kidney disease: Secondary | ICD-10-CM | POA: Diagnosis not present

## 2017-08-03 DIAGNOSIS — Z992 Dependence on renal dialysis: Secondary | ICD-10-CM | POA: Diagnosis not present

## 2017-08-03 DIAGNOSIS — D509 Iron deficiency anemia, unspecified: Secondary | ICD-10-CM | POA: Diagnosis not present

## 2017-08-03 DIAGNOSIS — N2581 Secondary hyperparathyroidism of renal origin: Secondary | ICD-10-CM | POA: Diagnosis not present

## 2017-08-04 ENCOUNTER — Encounter (INDEPENDENT_AMBULATORY_CARE_PROVIDER_SITE_OTHER): Payer: Self-pay

## 2017-08-05 DIAGNOSIS — D509 Iron deficiency anemia, unspecified: Secondary | ICD-10-CM | POA: Diagnosis not present

## 2017-08-05 DIAGNOSIS — D631 Anemia in chronic kidney disease: Secondary | ICD-10-CM | POA: Diagnosis not present

## 2017-08-05 DIAGNOSIS — N2581 Secondary hyperparathyroidism of renal origin: Secondary | ICD-10-CM | POA: Diagnosis not present

## 2017-08-05 DIAGNOSIS — N186 End stage renal disease: Secondary | ICD-10-CM | POA: Diagnosis not present

## 2017-08-05 DIAGNOSIS — Z992 Dependence on renal dialysis: Secondary | ICD-10-CM | POA: Diagnosis not present

## 2017-08-07 DIAGNOSIS — N2581 Secondary hyperparathyroidism of renal origin: Secondary | ICD-10-CM | POA: Diagnosis not present

## 2017-08-07 DIAGNOSIS — D631 Anemia in chronic kidney disease: Secondary | ICD-10-CM | POA: Diagnosis not present

## 2017-08-07 DIAGNOSIS — D509 Iron deficiency anemia, unspecified: Secondary | ICD-10-CM | POA: Diagnosis not present

## 2017-08-07 DIAGNOSIS — N186 End stage renal disease: Secondary | ICD-10-CM | POA: Diagnosis not present

## 2017-08-07 DIAGNOSIS — Z992 Dependence on renal dialysis: Secondary | ICD-10-CM | POA: Diagnosis not present

## 2017-08-09 ENCOUNTER — Other Ambulatory Visit (INDEPENDENT_AMBULATORY_CARE_PROVIDER_SITE_OTHER): Payer: Self-pay | Admitting: Vascular Surgery

## 2017-08-09 MED ORDER — CEFAZOLIN SODIUM-DEXTROSE 1-4 GM/50ML-% IV SOLN
1.0000 g | Freq: Once | INTRAVENOUS | Status: AC
  Administered 2017-08-10: 1 g via INTRAVENOUS

## 2017-08-10 ENCOUNTER — Encounter
Admission: RE | Disposition: A | Discharge status: Home / Self Care | Payer: Self-pay | Source: Ambulatory Visit | Attending: Vascular Surgery

## 2017-08-10 ENCOUNTER — Ambulatory Visit
Admission: RE | Admit: 2017-08-10 | Discharge: 2017-08-10 | Disposition: A | Discharge status: Home / Self Care | Payer: Medicare Other | Source: Ambulatory Visit | Attending: Vascular Surgery | Admitting: Vascular Surgery

## 2017-08-10 DIAGNOSIS — Z87891 Personal history of nicotine dependence: Secondary | ICD-10-CM | POA: Diagnosis not present

## 2017-08-10 DIAGNOSIS — J449 Chronic obstructive pulmonary disease, unspecified: Secondary | ICD-10-CM | POA: Insufficient documentation

## 2017-08-10 DIAGNOSIS — Z9889 Other specified postprocedural states: Secondary | ICD-10-CM | POA: Insufficient documentation

## 2017-08-10 DIAGNOSIS — I713 Abdominal aortic aneurysm, ruptured: Secondary | ICD-10-CM | POA: Insufficient documentation

## 2017-08-10 DIAGNOSIS — Z933 Colostomy status: Secondary | ICD-10-CM | POA: Diagnosis not present

## 2017-08-10 DIAGNOSIS — Z8249 Family history of ischemic heart disease and other diseases of the circulatory system: Secondary | ICD-10-CM | POA: Diagnosis not present

## 2017-08-10 DIAGNOSIS — Z992 Dependence on renal dialysis: Secondary | ICD-10-CM | POA: Diagnosis not present

## 2017-08-10 DIAGNOSIS — Z951 Presence of aortocoronary bypass graft: Secondary | ICD-10-CM | POA: Insufficient documentation

## 2017-08-10 DIAGNOSIS — Z9981 Dependence on supplemental oxygen: Secondary | ICD-10-CM | POA: Diagnosis not present

## 2017-08-10 DIAGNOSIS — N186 End stage renal disease: Secondary | ICD-10-CM | POA: Insufficient documentation

## 2017-08-10 DIAGNOSIS — Z888 Allergy status to other drugs, medicaments and biological substances status: Secondary | ICD-10-CM | POA: Diagnosis not present

## 2017-08-10 DIAGNOSIS — Z886 Allergy status to analgesic agent status: Secondary | ICD-10-CM | POA: Diagnosis not present

## 2017-08-10 DIAGNOSIS — I5030 Unspecified diastolic (congestive) heart failure: Secondary | ICD-10-CM | POA: Diagnosis not present

## 2017-08-10 DIAGNOSIS — Y832 Surgical operation with anastomosis, bypass or graft as the cause of abnormal reaction of the patient, or of later complication, without mention of misadventure at the time of the procedure: Secondary | ICD-10-CM | POA: Insufficient documentation

## 2017-08-10 DIAGNOSIS — Z85820 Personal history of malignant melanoma of skin: Secondary | ICD-10-CM | POA: Diagnosis not present

## 2017-08-10 DIAGNOSIS — D7582 Heparin induced thrombocytopenia (HIT): Secondary | ICD-10-CM | POA: Insufficient documentation

## 2017-08-10 DIAGNOSIS — Z90710 Acquired absence of both cervix and uterus: Secondary | ICD-10-CM | POA: Insufficient documentation

## 2017-08-10 DIAGNOSIS — E039 Hypothyroidism, unspecified: Secondary | ICD-10-CM | POA: Insufficient documentation

## 2017-08-10 DIAGNOSIS — T82868A Thrombosis of vascular prosthetic devices, implants and grafts, initial encounter: Secondary | ICD-10-CM | POA: Insufficient documentation

## 2017-08-10 DIAGNOSIS — Z823 Family history of stroke: Secondary | ICD-10-CM | POA: Insufficient documentation

## 2017-08-10 DIAGNOSIS — N2581 Secondary hyperparathyroidism of renal origin: Secondary | ICD-10-CM | POA: Diagnosis not present

## 2017-08-10 DIAGNOSIS — D631 Anemia in chronic kidney disease: Secondary | ICD-10-CM | POA: Diagnosis not present

## 2017-08-10 DIAGNOSIS — D509 Iron deficiency anemia, unspecified: Secondary | ICD-10-CM | POA: Diagnosis not present

## 2017-08-10 DIAGNOSIS — I132 Hypertensive heart and chronic kidney disease with heart failure and with stage 5 chronic kidney disease, or end stage renal disease: Secondary | ICD-10-CM | POA: Diagnosis not present

## 2017-08-10 DIAGNOSIS — I739 Peripheral vascular disease, unspecified: Secondary | ICD-10-CM | POA: Insufficient documentation

## 2017-08-10 DIAGNOSIS — E785 Hyperlipidemia, unspecified: Secondary | ICD-10-CM | POA: Insufficient documentation

## 2017-08-10 DIAGNOSIS — Z8619 Personal history of other infectious and parasitic diseases: Secondary | ICD-10-CM | POA: Insufficient documentation

## 2017-08-10 DIAGNOSIS — Z8049 Family history of malignant neoplasm of other genital organs: Secondary | ICD-10-CM | POA: Diagnosis not present

## 2017-08-10 HISTORY — PX: A/V FISTULAGRAM: CATH118298

## 2017-08-10 LAB — POTASSIUM (ARMC VASCULAR LAB ONLY): Potassium (ARMC vascular lab): 3.8 (ref 3.5–5.1)

## 2017-08-10 SURGERY — A/V FISTULAGRAM
Anesthesia: Moderate Sedation | Laterality: Left

## 2017-08-10 MED ORDER — MIDAZOLAM HCL 2 MG/2ML IJ SOLN
INTRAMUSCULAR | Status: DC | PRN
  Administered 2017-08-10 (×5): 1 mg via INTRAVENOUS

## 2017-08-10 MED ORDER — SODIUM CHLORIDE 0.9 % IV SOLN
INTRAVENOUS | Status: DC
  Administered 2017-08-10: 07:00:00 via INTRAVENOUS

## 2017-08-10 MED ORDER — HEPARIN SODIUM (PORCINE) 1000 UNIT/ML IJ SOLN
INTRAMUSCULAR | Status: AC
  Filled 2017-08-10: qty 1

## 2017-08-10 MED ORDER — HYDROMORPHONE HCL 1 MG/ML IJ SOLN: 1.0000 mg | Freq: Once | INTRAMUSCULAR | Status: DC | PRN

## 2017-08-10 MED ORDER — HEPARIN (PORCINE) IN NACL 1000-0.9 UT/500ML-% IV SOLN
INTRAVENOUS | Status: AC
  Filled 2017-08-10: qty 1000

## 2017-08-10 MED ORDER — IOPAMIDOL (ISOVUE-300) INJECTION 61%
INTRAVENOUS | Status: DC | PRN
  Administered 2017-08-10: 40 mL via INTRAVENOUS

## 2017-08-10 MED ORDER — METHYLPREDNISOLONE SODIUM SUCC 125 MG IJ SOLR: 125.0000 mg | INTRAMUSCULAR | Status: DC | PRN

## 2017-08-10 MED ORDER — MIDAZOLAM HCL 5 MG/5ML IJ SOLN
INTRAMUSCULAR | Status: AC
  Filled 2017-08-10: qty 5

## 2017-08-10 MED ORDER — BIVALIRUDIN BOLUS VIA INFUSION - CUPID
INTRAVENOUS | Status: DC | PRN
  Administered 2017-08-10: 29.05 mg via INTRAVENOUS

## 2017-08-10 MED ORDER — FENTANYL CITRATE (PF) 100 MCG/2ML IJ SOLN
INTRAMUSCULAR | Status: AC
  Filled 2017-08-10: qty 2

## 2017-08-10 MED ORDER — FENTANYL CITRATE (PF) 100 MCG/2ML IJ SOLN
INTRAMUSCULAR | Status: DC | PRN
  Administered 2017-08-10 (×4): 50 ug via INTRAVENOUS

## 2017-08-10 MED ORDER — FAMOTIDINE 20 MG PO TABS: 40.0000 mg | ORAL_TABLET | ORAL | Status: DC | PRN

## 2017-08-10 MED ORDER — BIVALIRUDIN TRIFLUOROACETATE 250 MG IV SOLR
INTRAVENOUS | Status: AC
  Filled 2017-08-10: qty 250

## 2017-08-10 MED ORDER — LIDOCAINE HCL (PF) 1 % IJ SOLN
INTRAMUSCULAR | Status: AC
  Filled 2017-08-10: qty 30

## 2017-08-10 MED ORDER — ONDANSETRON HCL 4 MG/2ML IJ SOLN: 4.0000 mg | Freq: Four times a day (QID) | INTRAMUSCULAR | Status: DC | PRN

## 2017-08-10 SURGICAL SUPPLY — 15 items
BALLN DORADO 8X60X80 (BALLOONS) ×3
BALLOON DORADO 8X60X80 (BALLOONS) IMPLANT
DEVICE PRESTO INFLATION (MISCELLANEOUS) ×2 IMPLANT
GLIDECATH NONTAPER ANGL 5FR (CATHETERS) ×2 IMPLANT
GUIDEWIRE ANGLED .035 180CM (WIRE) ×2 IMPLANT
KIT THROMB PERC PTD (MISCELLANEOUS) ×2 IMPLANT
NDL ENTRY 21GA 7CM ECHOTIP (NEEDLE) IMPLANT
NEEDLE ENTRY 21GA 7CM ECHOTIP (NEEDLE) ×6 IMPLANT
PACK ANGIOGRAPHY (CUSTOM PROCEDURE TRAY) ×3 IMPLANT
SET INTRO CAPELLA COAXIAL (SET/KITS/TRAYS/PACK) ×4 IMPLANT
SHEATH BRITE TIP 6FRX5.5 (SHEATH) ×6 IMPLANT
SHEATH BRITE TIP 7FRX5.5 (SHEATH) ×2 IMPLANT
STENT COVERA FLARED 8X80X80 (Permanent Stent) ×2 IMPLANT
SUT MNCRL AB 4-0 PS2 18 (SUTURE) ×4 IMPLANT
WIRE MAGIC TORQUE 260C (WIRE) ×2 IMPLANT

## 2017-08-10 NOTE — H&P (Signed)
[facility] History & Physical Update  The patient was interviewed and re-examined.  The patient's previous History and Physical has been reviewed and is unchanged.  There is no change in the plan of care. We plan to proceed with the scheduled procedure.  Hortencia Pilar, MD  08/10/2017, 7:56 AM

## 2017-08-10 NOTE — Op Note (Addendum)
Jamie NOTE   PROCEDURE: 1. Contrast injection left arm brachial axillary AV graft. 2. Mechanical thrombectomy with Trerotola left arm brachial axillary AV graft 3. Percutaneous transluminal angioplasty and stent placement using an 8 mm x 80 mm Covera.  PRE-Jamie DIAGNOSIS: Complication of dialysis access                                                       End Stage Renal Disease  POST-Jamie DIAGNOSIS: same as above   SURGEON: Katha Cabal, M.D.  ANESTHESIA: Conscious sedation was administered by the radiology RN under my direct supervision. IV Versed plus fentanyl were utilized. Continuous ECG, pulse oximetry and blood pressure was monitored throughout the entire procedure. Conscious sedation was for a total of 79 minutes.    ESTIMATED BLOOD LOSS: minimal  FINDING(S): 1. Thrombus within the graft with recurrence of the venous outflow narrowing that was angioplastied approximately 3 weeks ago  SPECIMEN(S):  None  CONTRAST: 40 cc  FLUOROSCOPY TIME: 9.0 minutes  INDICATIONS: AVAROSE MERVINE is a 67 y.o. female who  presents with malfunctioning left arm brachial axillary AV access.  The patient is scheduled for angiography with possible intervention of the AV access.  The patient is aware the risks include but are not limited to: bleeding, infection, thrombosis of the cannulated access, and possible anaphylactic reaction to the contrast.  The patient acknowledges if the access can not be salvaged a tunneled catheter will be needed and will be placed during this procedure.  The patient is aware of the risks of the procedure and elects to proceed with the angiogram and intervention.  DESCRIPTION: After full informed written consent was obtained, the patient was brought back to the Special Procedure suite and placed supine position.  Appropriate cardiopulmonary monitors were placed.  The left arm was prepped and draped in the standard fashion.  Appropriate timeout is  called. The left arm brachial axillary AV graft was cannulated with a micropuncture needle using ultrasound guidance.  With the ultrasound the AV access appeared to be filled with heterogeneous material and was poorly compressible indicating thrombosis of the AV access. The puncture was made under direct ultrasound visualization and an image was recorded for the permanent record.  The microwire was advanced and the needle was exchanged for  a microsheath.  The J-wire was then advanced and a 6 Fr sheath inserted.  Hand was then performed which demonstrated thrombus within the AV access.  The central venous structures were also imaged by hand injections.  One half bolus of Angiomax was given and allowed to circulate as well.  A Trerotola device was then advanced beginning centrally and pulling back performing.  Several passes were made through the venous portion of the graft. Follow-up imaging now demonstrates the vast majority of the clot had been treated. Therefore a retrograde sheath was inserted. This was a 6 [place] sheath and was positioned more proximally on the arm and angled in the retrograde direction. The left arm brachial axillary AV graft was cannulated with a micropuncture needle using ultrasound guidance.  With the ultrasound the AV access appeared to be filled with heterogeneous material and was poorly compressible indicating thrombosis of the AV access. The puncture was made under direct ultrasound visualization and an image was recorded for the permanent record.  Subsequently a floppy Glidewire  and a KMP catheter were negotiated into the arterial system hand injection contrast was then utilized to demonstrate patency of the artery as well as the location for the anastomosis. The Trerotola device was now advanced through the retrograde sheath was extended out into the artery the basket was opened and it was slowly pulled back into the graft and then the basket was engaged. Several passes were made  on the arterial portion and pulsatility of the access was reestablished. Follow-up imaging demonstrates there was now thrombus in the venous portion surrounding the sheath and this was treated with the Trerotola device from the antegrade direction. After several passes imaging demonstrated resolution of thrombus within the graft and forward flow however greater than 90% stricture of the graft was noted in the venous outflow tandem lesions were identified.  Magic torque wire was then advanced through the antegrade sheath and an 8 x 6 Dorado balloon was used to angioplasty the venous portion of the AV access. Multiple inflations were performed inflation times with 30 seconds to 1 minute with maximum pressures of 20 ATM.  Following antroplasty there was greater than 50% residual stenosis and therefore an 8 mm x 80 mm Covera stent was deployed across the venous anastomosis and postdilated with the 8 mm Dorado balloon inflated to 20 atm.  Following the imaging demonstrated less than 5% residual stenosis throughout the venous outflow.  With the balloon inflated reflux of contrast was performed demonstrating the arterial anastomosis. The arterial anastomosis was widely patent. Contrast was then injected in the forward direction demonstrating rapid flow.  A 4-0 Monocryl purse-string suture was sewn around both of the sheaths.  The sheaths were removed and light pressure was applied.  A sterile bandage was applied to the puncture site.    COMPLICATIONS: None  CONDITION: Improved  Katha Cabal, M.D [facility] Office: [phone]  08/10/2017 9:23 AM

## 2017-08-10 NOTE — Discharge Instructions (Signed)

## 2017-08-12 DIAGNOSIS — Z992 Dependence on renal dialysis: Secondary | ICD-10-CM | POA: Diagnosis not present

## 2017-08-12 DIAGNOSIS — N2581 Secondary hyperparathyroidism of renal origin: Secondary | ICD-10-CM | POA: Diagnosis not present

## 2017-08-12 DIAGNOSIS — N186 End stage renal disease: Secondary | ICD-10-CM | POA: Diagnosis not present

## 2017-08-12 DIAGNOSIS — D631 Anemia in chronic kidney disease: Secondary | ICD-10-CM | POA: Diagnosis not present

## 2017-08-12 DIAGNOSIS — D509 Iron deficiency anemia, unspecified: Secondary | ICD-10-CM | POA: Diagnosis not present

## 2017-08-14 DIAGNOSIS — N186 End stage renal disease: Secondary | ICD-10-CM | POA: Diagnosis not present

## 2017-08-14 DIAGNOSIS — Z992 Dependence on renal dialysis: Secondary | ICD-10-CM | POA: Diagnosis not present

## 2017-08-14 DIAGNOSIS — N2581 Secondary hyperparathyroidism of renal origin: Secondary | ICD-10-CM | POA: Diagnosis not present

## 2017-08-14 DIAGNOSIS — D631 Anemia in chronic kidney disease: Secondary | ICD-10-CM | POA: Diagnosis not present

## 2017-08-14 DIAGNOSIS — D509 Iron deficiency anemia, unspecified: Secondary | ICD-10-CM | POA: Diagnosis not present

## 2017-08-16 ENCOUNTER — Ambulatory Visit (INDEPENDENT_AMBULATORY_CARE_PROVIDER_SITE_OTHER): Payer: Medicare Other | Admitting: Vascular Surgery

## 2017-08-16 ENCOUNTER — Encounter (INDEPENDENT_AMBULATORY_CARE_PROVIDER_SITE_OTHER): Payer: Medicare Other

## 2017-08-17 DIAGNOSIS — Z992 Dependence on renal dialysis: Secondary | ICD-10-CM | POA: Diagnosis not present

## 2017-08-17 DIAGNOSIS — N186 End stage renal disease: Secondary | ICD-10-CM | POA: Diagnosis not present

## 2017-08-17 DIAGNOSIS — D509 Iron deficiency anemia, unspecified: Secondary | ICD-10-CM | POA: Diagnosis not present

## 2017-08-17 DIAGNOSIS — D631 Anemia in chronic kidney disease: Secondary | ICD-10-CM | POA: Diagnosis not present

## 2017-08-17 DIAGNOSIS — N2581 Secondary hyperparathyroidism of renal origin: Secondary | ICD-10-CM | POA: Diagnosis not present

## 2017-08-19 DIAGNOSIS — D509 Iron deficiency anemia, unspecified: Secondary | ICD-10-CM | POA: Diagnosis not present

## 2017-08-19 DIAGNOSIS — Z992 Dependence on renal dialysis: Secondary | ICD-10-CM | POA: Diagnosis not present

## 2017-08-19 DIAGNOSIS — N186 End stage renal disease: Secondary | ICD-10-CM | POA: Diagnosis not present

## 2017-08-19 DIAGNOSIS — N2581 Secondary hyperparathyroidism of renal origin: Secondary | ICD-10-CM | POA: Diagnosis not present

## 2017-08-19 DIAGNOSIS — D631 Anemia in chronic kidney disease: Secondary | ICD-10-CM | POA: Diagnosis not present

## 2017-08-21 DIAGNOSIS — D509 Iron deficiency anemia, unspecified: Secondary | ICD-10-CM | POA: Diagnosis not present

## 2017-08-21 DIAGNOSIS — D631 Anemia in chronic kidney disease: Secondary | ICD-10-CM | POA: Diagnosis not present

## 2017-08-21 DIAGNOSIS — N2581 Secondary hyperparathyroidism of renal origin: Secondary | ICD-10-CM | POA: Diagnosis not present

## 2017-08-21 DIAGNOSIS — Z992 Dependence on renal dialysis: Secondary | ICD-10-CM | POA: Diagnosis not present

## 2017-08-21 DIAGNOSIS — N186 End stage renal disease: Secondary | ICD-10-CM | POA: Diagnosis not present

## 2017-08-24 DIAGNOSIS — D631 Anemia in chronic kidney disease: Secondary | ICD-10-CM | POA: Diagnosis not present

## 2017-08-24 DIAGNOSIS — N2581 Secondary hyperparathyroidism of renal origin: Secondary | ICD-10-CM | POA: Diagnosis not present

## 2017-08-24 DIAGNOSIS — Z992 Dependence on renal dialysis: Secondary | ICD-10-CM | POA: Diagnosis not present

## 2017-08-24 DIAGNOSIS — N186 End stage renal disease: Secondary | ICD-10-CM | POA: Diagnosis not present

## 2017-08-24 DIAGNOSIS — D509 Iron deficiency anemia, unspecified: Secondary | ICD-10-CM | POA: Diagnosis not present

## 2017-08-26 ENCOUNTER — Other Ambulatory Visit (INDEPENDENT_AMBULATORY_CARE_PROVIDER_SITE_OTHER): Payer: Self-pay | Admitting: Vascular Surgery

## 2017-08-26 DIAGNOSIS — N2581 Secondary hyperparathyroidism of renal origin: Secondary | ICD-10-CM | POA: Diagnosis not present

## 2017-08-26 DIAGNOSIS — D509 Iron deficiency anemia, unspecified: Secondary | ICD-10-CM | POA: Diagnosis not present

## 2017-08-26 DIAGNOSIS — N185 Chronic kidney disease, stage 5: Secondary | ICD-10-CM

## 2017-08-26 DIAGNOSIS — D631 Anemia in chronic kidney disease: Secondary | ICD-10-CM | POA: Diagnosis not present

## 2017-08-26 DIAGNOSIS — N186 End stage renal disease: Secondary | ICD-10-CM | POA: Diagnosis not present

## 2017-08-26 DIAGNOSIS — Z992 Dependence on renal dialysis: Secondary | ICD-10-CM | POA: Diagnosis not present

## 2017-08-27 DIAGNOSIS — N186 End stage renal disease: Secondary | ICD-10-CM | POA: Diagnosis not present

## 2017-08-27 DIAGNOSIS — Z992 Dependence on renal dialysis: Secondary | ICD-10-CM | POA: Diagnosis not present

## 2017-08-28 DIAGNOSIS — D509 Iron deficiency anemia, unspecified: Secondary | ICD-10-CM | POA: Diagnosis not present

## 2017-08-28 DIAGNOSIS — D631 Anemia in chronic kidney disease: Secondary | ICD-10-CM | POA: Diagnosis not present

## 2017-08-28 DIAGNOSIS — N2581 Secondary hyperparathyroidism of renal origin: Secondary | ICD-10-CM | POA: Diagnosis not present

## 2017-08-28 DIAGNOSIS — Z992 Dependence on renal dialysis: Secondary | ICD-10-CM | POA: Diagnosis not present

## 2017-08-28 DIAGNOSIS — N186 End stage renal disease: Secondary | ICD-10-CM | POA: Diagnosis not present

## 2017-08-30 ENCOUNTER — Ambulatory Visit (INDEPENDENT_AMBULATORY_CARE_PROVIDER_SITE_OTHER): Payer: Medicare Other | Admitting: Vascular Surgery

## 2017-08-30 ENCOUNTER — Encounter (INDEPENDENT_AMBULATORY_CARE_PROVIDER_SITE_OTHER): Payer: Self-pay | Admitting: Vascular Surgery

## 2017-08-30 ENCOUNTER — Encounter (INDEPENDENT_AMBULATORY_CARE_PROVIDER_SITE_OTHER): Payer: Self-pay

## 2017-08-30 ENCOUNTER — Other Ambulatory Visit (INDEPENDENT_AMBULATORY_CARE_PROVIDER_SITE_OTHER): Payer: Self-pay | Admitting: Vascular Surgery

## 2017-08-30 ENCOUNTER — Ambulatory Visit (INDEPENDENT_AMBULATORY_CARE_PROVIDER_SITE_OTHER): Payer: Medicare Other

## 2017-08-30 VITALS — BP 132/65 | HR 81 | Resp 13 | Ht 65.0 in | Wt 136.0 lb

## 2017-08-30 DIAGNOSIS — T82330S Leakage of aortic (bifurcation) graft (replacement), sequela: Secondary | ICD-10-CM | POA: Diagnosis not present

## 2017-08-30 DIAGNOSIS — D75829 Heparin-induced thrombocytopenia, unspecified: Secondary | ICD-10-CM

## 2017-08-30 DIAGNOSIS — N185 Chronic kidney disease, stage 5: Secondary | ICD-10-CM | POA: Diagnosis not present

## 2017-08-30 DIAGNOSIS — N186 End stage renal disease: Secondary | ICD-10-CM | POA: Diagnosis not present

## 2017-08-30 DIAGNOSIS — T829XXS Unspecified complication of cardiac and vascular prosthetic device, implant and graft, sequela: Secondary | ICD-10-CM

## 2017-08-30 DIAGNOSIS — D7582 Heparin induced thrombocytopenia (HIT): Secondary | ICD-10-CM

## 2017-08-30 DIAGNOSIS — I10 Essential (primary) hypertension: Secondary | ICD-10-CM

## 2017-08-30 DIAGNOSIS — IMO0001 Reserved for inherently not codable concepts without codable children: Secondary | ICD-10-CM
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
## 2017-08-31 DIAGNOSIS — D631 Anemia in chronic kidney disease: Secondary | ICD-10-CM | POA: Diagnosis not present

## 2017-08-31 DIAGNOSIS — D509 Iron deficiency anemia, unspecified: Secondary | ICD-10-CM | POA: Diagnosis not present

## 2017-08-31 DIAGNOSIS — Z992 Dependence on renal dialysis: Secondary | ICD-10-CM | POA: Diagnosis not present

## 2017-08-31 DIAGNOSIS — N2581 Secondary hyperparathyroidism of renal origin: Secondary | ICD-10-CM | POA: Diagnosis not present

## 2017-08-31 DIAGNOSIS — N186 End stage renal disease: Secondary | ICD-10-CM | POA: Diagnosis not present

## 2017-09-01 ENCOUNTER — Encounter
Admission: RE | Disposition: A | Discharge status: Home / Self Care | Payer: Self-pay | Source: Ambulatory Visit | Attending: Vascular Surgery

## 2017-09-01 ENCOUNTER — Ambulatory Visit
Admission: RE | Admit: 2017-09-01 | Discharge: 2017-09-01 | Disposition: A | Discharge status: Home / Self Care | Payer: Medicare Other | Source: Ambulatory Visit | Attending: Vascular Surgery | Admitting: Vascular Surgery

## 2017-09-01 DIAGNOSIS — T829XXA Unspecified complication of cardiac and vascular prosthetic device, implant and graft, initial encounter: Secondary | ICD-10-CM | POA: Diagnosis not present

## 2017-09-01 DIAGNOSIS — N186 End stage renal disease: Secondary | ICD-10-CM | POA: Diagnosis not present

## 2017-09-01 DIAGNOSIS — Z4901 Encounter for fitting and adjustment of extracorporeal dialysis catheter: Secondary | ICD-10-CM | POA: Diagnosis not present

## 2017-09-01 HISTORY — PX: DIALYSIS/PERMA CATHETER REMOVAL: CATH118289

## 2017-09-01 SURGERY — DIALYSIS/PERMA CATHETER REMOVAL
Anesthesia: LOCAL | Laterality: Left

## 2017-09-01 MED ORDER — LIDOCAINE-EPINEPHRINE (PF) 1 %-1:200000 IJ SOLN
INTRAMUSCULAR | Status: DC | PRN
Start: 2017-09-01 — End: 2017-09-01
  Administered 2017-09-01: 20 mL via INTRADERMAL

## 2017-09-01 SURGICAL SUPPLY — 2 items
SUT MNCRL AB 4-0 PS2 18 (SUTURE) ×1 IMPLANT
TRAY LACERAT/PLASTIC (MISCELLANEOUS) ×1 IMPLANT

## 2017-09-01 NOTE — Discharge Instructions (Signed)
Tunneled Catheter Removal, Care After °Refer to this sheet in the next few weeks. These instructions provide you with information about caring for yourself after your procedure. Your health care provider may also give you more specific instructions. Your treatment has been planned according to current medical practices, but problems sometimes occur. Call your health care provider if you have any problems or questions after your procedure. °What can I expect after the procedure? °After the procedure, it is common to have: °· Some mild redness, swelling, and pain around your catheter site. ° ° °Follow these instructions at home: °Incision care  °· Check your removal site  every day for signs of infection. Check for: °¨ More redness, swelling, or pain. °¨ More fluid or blood. °¨ Warmth. °¨ Pus or a bad smell. °· Follow instructions from your health care provider about how to take care of your removal site. Make sure you: °¨ Wash your hands with soap and water before you change your bandages (dressings). If soap and water are not available, use hand sanitizer. °Activity  °· Return to your normal activities as told by your health care provider. Ask your health care provider what activities are safe for you. °· Do not lift anything that is heavier than 10 lb (4.5 kg) for 3 weeks or as long as told by your health care provider. ° °Contact a health care provider if: °· You have more fluid or blood coming from your removal site °· You have more redness, swelling, or pain at your incisions or around the area where your catheter was removed °· Your removal site feel warm to the touch. °· You feel unusually weak. °· You feel nauseous.. °· Get help right away if °· You have swelling in your arm, shoulder, neck, or face. °· You develop chest pain. °· You have difficulty breathing. °· You feel dizzy or light-headed. °· You have pus or a bad smell coming from your removal site °· You have a fever. °· You develop bleeding from your  removal site, and your bleeding does not stop. °This information is not intended to replace advice given to you by your health care provider. Make sure you discuss any questions you have with your health care provider. °Document Released: 03/02/2012 Document Revised: 11/17/2015 Document Reviewed: 12/10/2014 °Elsevier Interactive Patient Education © 2017 [employer] ° °

## 2017-09-01 NOTE — Op Note (Signed)
  OPERATIVE NOTE   PROCEDURE: 1. Removal of a left IJ tunneled dialysis catheter  PRE-OPERATIVE DIAGNOSIS: Complication of dialysis catheter  POST-OPERATIVE DIAGNOSIS: Same  SURGEON: Hortencia Pilar  ANESTHESIA: Local anesthetic with 1% lidocaine with epinephrine   ESTIMATED BLOOD LOSS: Minimal   FINDING(S): 1. Catheter intact   SPECIMEN(S):  Catheter  INDICATIONS:   [name] is a 67 y.o. female who presents with working arm access.  Her tunnel catheter is no longer necessary and therefore is being removed to prevent lethal sepsis.  DESCRIPTION: After obtaining full informed written consent, the patient was positioned supine. The left IJ tunneled catheter and surrounding area is prepped and draped in a sterile fashion. The cuff was localized by palpation and noted to be greater than 3 cm from the exit site. After appropriate timeout is called, 1% lidocaine with epinephrine is infiltrated into the surrounding tissues around the cuff. Small transverse incision is created with an 11 blade scalpel and the dissection was carried down to expose the cuff of the tunneled catheter.  The catheter is then freed from the surrounding attachments and adhesions. Once the catheter has been freed circumferentially it is transected just distal to the cuff and subsequently removed in 2 pieces. Light pressure was held at the base of the neck. A 4-0 Monocryl was used close the tunnel in the subcutaneous space. The 4-0 Monocryl Monocryl was then used to close the skin in a subcuticular stitch. Dermabond is applied.  Antibiotic ointment and a sterile dressing is applied to the exit site. Patient tolerated procedure well and there were no complications.  COMPLICATIONS: None  CONDITION: Unchanged  Hortencia Pilar. [facility] Office: [phone]  09/01/2017,2:02 PM

## 2017-09-01 NOTE — H&P (Signed)
[facility] History & Physical Update  The patient was interviewed and re-examined.  The patient's previous History and Physical has been reviewed and is unchanged.  There is no change in the plan of care. We plan to proceed with the scheduled procedure.  Hortencia Pilar, MD  09/01/2017, 1:47 PM

## 2017-09-02 ENCOUNTER — Encounter: Payer: Self-pay | Admitting: Vascular Surgery

## 2017-09-02 DIAGNOSIS — D631 Anemia in chronic kidney disease: Secondary | ICD-10-CM | POA: Diagnosis not present

## 2017-09-02 DIAGNOSIS — D509 Iron deficiency anemia, unspecified: Secondary | ICD-10-CM | POA: Diagnosis not present

## 2017-09-02 DIAGNOSIS — N2581 Secondary hyperparathyroidism of renal origin: Secondary | ICD-10-CM | POA: Diagnosis not present

## 2017-09-02 DIAGNOSIS — N186 End stage renal disease: Secondary | ICD-10-CM | POA: Diagnosis not present

## 2017-09-02 DIAGNOSIS — Z992 Dependence on renal dialysis: Secondary | ICD-10-CM | POA: Diagnosis not present

## 2017-09-04 DIAGNOSIS — N2581 Secondary hyperparathyroidism of renal origin: Secondary | ICD-10-CM | POA: Diagnosis not present

## 2017-09-04 DIAGNOSIS — Z992 Dependence on renal dialysis: Secondary | ICD-10-CM | POA: Diagnosis not present

## 2017-09-04 DIAGNOSIS — D631 Anemia in chronic kidney disease: Secondary | ICD-10-CM | POA: Diagnosis not present

## 2017-09-04 DIAGNOSIS — D509 Iron deficiency anemia, unspecified: Secondary | ICD-10-CM | POA: Diagnosis not present

## 2017-09-04 DIAGNOSIS — N186 End stage renal disease: Secondary | ICD-10-CM | POA: Diagnosis not present

## 2017-09-07 DIAGNOSIS — N186 End stage renal disease: Secondary | ICD-10-CM | POA: Diagnosis not present

## 2017-09-07 DIAGNOSIS — D509 Iron deficiency anemia, unspecified: Secondary | ICD-10-CM | POA: Diagnosis not present

## 2017-09-07 DIAGNOSIS — N2581 Secondary hyperparathyroidism of renal origin: Secondary | ICD-10-CM | POA: Diagnosis not present

## 2017-09-07 DIAGNOSIS — D631 Anemia in chronic kidney disease: Secondary | ICD-10-CM | POA: Diagnosis not present

## 2017-09-07 DIAGNOSIS — Z992 Dependence on renal dialysis: Secondary | ICD-10-CM | POA: Diagnosis not present

## 2017-09-08 DIAGNOSIS — E038 Other specified hypothyroidism: Secondary | ICD-10-CM | POA: Diagnosis not present

## 2017-09-08 DIAGNOSIS — N186 End stage renal disease: Secondary | ICD-10-CM | POA: Diagnosis not present

## 2017-09-09 DIAGNOSIS — N2581 Secondary hyperparathyroidism of renal origin: Secondary | ICD-10-CM | POA: Diagnosis not present

## 2017-09-09 DIAGNOSIS — Z992 Dependence on renal dialysis: Secondary | ICD-10-CM | POA: Diagnosis not present

## 2017-09-09 DIAGNOSIS — D509 Iron deficiency anemia, unspecified: Secondary | ICD-10-CM | POA: Diagnosis not present

## 2017-09-09 DIAGNOSIS — N186 End stage renal disease: Secondary | ICD-10-CM | POA: Diagnosis not present

## 2017-09-09 DIAGNOSIS — D631 Anemia in chronic kidney disease: Secondary | ICD-10-CM | POA: Diagnosis not present

## 2017-09-11 DIAGNOSIS — D509 Iron deficiency anemia, unspecified: Secondary | ICD-10-CM | POA: Diagnosis not present

## 2017-09-11 DIAGNOSIS — Z992 Dependence on renal dialysis: Secondary | ICD-10-CM | POA: Diagnosis not present

## 2017-09-11 DIAGNOSIS — N186 End stage renal disease: Secondary | ICD-10-CM | POA: Diagnosis not present

## 2017-09-11 DIAGNOSIS — N2581 Secondary hyperparathyroidism of renal origin: Secondary | ICD-10-CM | POA: Diagnosis not present

## 2017-09-11 DIAGNOSIS — D631 Anemia in chronic kidney disease: Secondary | ICD-10-CM | POA: Diagnosis not present

## 2017-09-14 DIAGNOSIS — N2581 Secondary hyperparathyroidism of renal origin: Secondary | ICD-10-CM | POA: Diagnosis not present

## 2017-09-14 DIAGNOSIS — N186 End stage renal disease: Secondary | ICD-10-CM | POA: Diagnosis not present

## 2017-09-14 DIAGNOSIS — D509 Iron deficiency anemia, unspecified: Secondary | ICD-10-CM | POA: Diagnosis not present

## 2017-09-14 DIAGNOSIS — D631 Anemia in chronic kidney disease: Secondary | ICD-10-CM | POA: Diagnosis not present

## 2017-09-14 DIAGNOSIS — Z992 Dependence on renal dialysis: Secondary | ICD-10-CM | POA: Diagnosis not present

## 2017-09-16 DIAGNOSIS — Z992 Dependence on renal dialysis: Secondary | ICD-10-CM | POA: Diagnosis not present

## 2017-09-16 DIAGNOSIS — N186 End stage renal disease: Secondary | ICD-10-CM | POA: Diagnosis not present

## 2017-09-16 DIAGNOSIS — N2581 Secondary hyperparathyroidism of renal origin: Secondary | ICD-10-CM | POA: Diagnosis not present

## 2017-09-16 DIAGNOSIS — D509 Iron deficiency anemia, unspecified: Secondary | ICD-10-CM | POA: Diagnosis not present

## 2017-09-16 DIAGNOSIS — D631 Anemia in chronic kidney disease: Secondary | ICD-10-CM | POA: Diagnosis not present

## 2017-09-18 DIAGNOSIS — D631 Anemia in chronic kidney disease: Secondary | ICD-10-CM | POA: Diagnosis not present

## 2017-09-18 DIAGNOSIS — N2581 Secondary hyperparathyroidism of renal origin: Secondary | ICD-10-CM | POA: Diagnosis not present

## 2017-09-18 DIAGNOSIS — D509 Iron deficiency anemia, unspecified: Secondary | ICD-10-CM | POA: Diagnosis not present

## 2017-09-18 DIAGNOSIS — N186 End stage renal disease: Secondary | ICD-10-CM | POA: Diagnosis not present

## 2017-09-18 DIAGNOSIS — Z992 Dependence on renal dialysis: Secondary | ICD-10-CM | POA: Diagnosis not present

## 2017-09-20 DIAGNOSIS — I10 Essential (primary) hypertension: Secondary | ICD-10-CM | POA: Diagnosis not present

## 2017-09-20 DIAGNOSIS — J9611 Chronic respiratory failure with hypoxia: Secondary | ICD-10-CM | POA: Diagnosis not present

## 2017-09-20 DIAGNOSIS — L03116 Cellulitis of left lower limb: Secondary | ICD-10-CM | POA: Diagnosis not present

## 2017-09-20 DIAGNOSIS — J449 Chronic obstructive pulmonary disease, unspecified: Secondary | ICD-10-CM | POA: Diagnosis not present

## 2017-09-20 DIAGNOSIS — N186 End stage renal disease: Secondary | ICD-10-CM | POA: Diagnosis not present

## 2017-09-20 DIAGNOSIS — E038 Other specified hypothyroidism: Secondary | ICD-10-CM | POA: Diagnosis not present

## 2017-09-21 DIAGNOSIS — Z992 Dependence on renal dialysis: Secondary | ICD-10-CM | POA: Diagnosis not present

## 2017-09-21 DIAGNOSIS — N186 End stage renal disease: Secondary | ICD-10-CM | POA: Diagnosis not present

## 2017-09-21 DIAGNOSIS — D509 Iron deficiency anemia, unspecified: Secondary | ICD-10-CM | POA: Diagnosis not present

## 2017-09-21 DIAGNOSIS — N2581 Secondary hyperparathyroidism of renal origin: Secondary | ICD-10-CM | POA: Diagnosis not present

## 2017-09-21 DIAGNOSIS — D631 Anemia in chronic kidney disease: Secondary | ICD-10-CM | POA: Diagnosis not present

## 2017-09-23 DIAGNOSIS — N186 End stage renal disease: Secondary | ICD-10-CM | POA: Diagnosis not present

## 2017-09-23 DIAGNOSIS — Z992 Dependence on renal dialysis: Secondary | ICD-10-CM | POA: Diagnosis not present

## 2017-09-23 DIAGNOSIS — N2581 Secondary hyperparathyroidism of renal origin: Secondary | ICD-10-CM | POA: Diagnosis not present

## 2017-09-23 DIAGNOSIS — D509 Iron deficiency anemia, unspecified: Secondary | ICD-10-CM | POA: Diagnosis not present

## 2017-09-23 DIAGNOSIS — D631 Anemia in chronic kidney disease: Secondary | ICD-10-CM | POA: Diagnosis not present

## 2017-09-25 ENCOUNTER — Encounter: Payer: Self-pay | Admitting: *Deleted

## 2017-09-25 ENCOUNTER — Other Ambulatory Visit: Payer: Self-pay

## 2017-09-25 ENCOUNTER — Emergency Department: Payer: Medicare Other

## 2017-09-25 ENCOUNTER — Emergency Department
Admission: EM | Admit: 2017-09-25 | Discharge: 2017-09-25 | Disposition: A | Discharge status: Home / Self Care | Payer: Medicare Other | Attending: Emergency Medicine | Admitting: Emergency Medicine

## 2017-09-25 DIAGNOSIS — Y929 Unspecified place or not applicable: Secondary | ICD-10-CM | POA: Insufficient documentation

## 2017-09-25 DIAGNOSIS — E039 Hypothyroidism, unspecified: Secondary | ICD-10-CM | POA: Insufficient documentation

## 2017-09-25 DIAGNOSIS — Y9389 Activity, other specified: Secondary | ICD-10-CM | POA: Insufficient documentation

## 2017-09-25 DIAGNOSIS — M7989 Other specified soft tissue disorders: Secondary | ICD-10-CM

## 2017-09-25 DIAGNOSIS — W231XXA Caught, crushed, jammed, or pinched between stationary objects, initial encounter: Secondary | ICD-10-CM | POA: Diagnosis not present

## 2017-09-25 DIAGNOSIS — R2242 Localized swelling, mass and lump, left lower limb: Secondary | ICD-10-CM | POA: Diagnosis not present

## 2017-09-25 DIAGNOSIS — Z85828 Personal history of other malignant neoplasm of skin: Secondary | ICD-10-CM | POA: Insufficient documentation

## 2017-09-25 DIAGNOSIS — Y998 Other external cause status: Secondary | ICD-10-CM | POA: Insufficient documentation

## 2017-09-25 DIAGNOSIS — E1122 Type 2 diabetes mellitus with diabetic chronic kidney disease: Secondary | ICD-10-CM | POA: Diagnosis not present

## 2017-09-25 DIAGNOSIS — I132 Hypertensive heart and chronic kidney disease with heart failure and with stage 5 chronic kidney disease, or end stage renal disease: Secondary | ICD-10-CM | POA: Diagnosis not present

## 2017-09-25 DIAGNOSIS — Z87891 Personal history of nicotine dependence: Secondary | ICD-10-CM | POA: Diagnosis not present

## 2017-09-25 DIAGNOSIS — Z992 Dependence on renal dialysis: Secondary | ICD-10-CM | POA: Insufficient documentation

## 2017-09-25 DIAGNOSIS — J449 Chronic obstructive pulmonary disease, unspecified: Secondary | ICD-10-CM | POA: Diagnosis not present

## 2017-09-25 DIAGNOSIS — Z79899 Other long term (current) drug therapy: Secondary | ICD-10-CM | POA: Diagnosis not present

## 2017-09-25 DIAGNOSIS — D631 Anemia in chronic kidney disease: Secondary | ICD-10-CM | POA: Diagnosis not present

## 2017-09-25 DIAGNOSIS — E1140 Type 2 diabetes mellitus with diabetic neuropathy, unspecified: Secondary | ICD-10-CM | POA: Insufficient documentation

## 2017-09-25 DIAGNOSIS — S99922A Unspecified injury of left foot, initial encounter: Secondary | ICD-10-CM

## 2017-09-25 DIAGNOSIS — M79672 Pain in left foot: Secondary | ICD-10-CM | POA: Diagnosis not present

## 2017-09-25 DIAGNOSIS — D509 Iron deficiency anemia, unspecified: Secondary | ICD-10-CM | POA: Diagnosis not present

## 2017-09-25 DIAGNOSIS — N186 End stage renal disease: Secondary | ICD-10-CM | POA: Insufficient documentation

## 2017-09-25 DIAGNOSIS — I5030 Unspecified diastolic (congestive) heart failure: Secondary | ICD-10-CM | POA: Diagnosis not present

## 2017-09-25 DIAGNOSIS — N2581 Secondary hyperparathyroidism of renal origin: Secondary | ICD-10-CM | POA: Diagnosis not present

## 2017-09-25 MED ORDER — HYDROCODONE-ACETAMINOPHEN 5-325 MG PO TABS
1.0000 | ORAL_TABLET | Freq: Once | ORAL | Status: AC
  Administered 2017-09-25: 1 via ORAL
  Filled 2017-09-25: qty 1

## 2017-09-25 MED ORDER — HYDROCODONE-ACETAMINOPHEN 5-325 MG PO TABS: 1.0000 | ORAL_TABLET | Freq: Three times a day (TID) | ORAL | 0 refills | Status: DC | PRN

## 2017-09-25 NOTE — Discharge Instructions (Addendum)
Your xray was negative for fracture. You can elevate your leg and apply ice to help with swelling. Can take Ibuprofen 400 mg every 8 hours in addition to the Hydrocodone I have prescribed. You can take the Hydrocodone every 8 hours as needed. Follow up with your PCP if symptoms persist or worsen.
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
## 2017-09-25 NOTE — ED Triage Notes (Addendum)
Pt to ED reporting left foot pain that has worsened since hitting foot on her wheelchair this morning. Swelling and bruising noted to foot on interior side. PT reports she has had swelling in foot since Wednesday when PCP dx pt with cellulitis due to a small skin tear that he thought was spreading. The swelling had been improving and the infection had been improving per pt. No fevers and no signs of infection at this time.   Pt is not a diabetic

## 2017-09-25 NOTE — ED Notes (Signed)
C/o L foot pain after hitting it this AM. No distress noted. Family at bedside.

## 2017-09-26 DIAGNOSIS — Z992 Dependence on renal dialysis: Secondary | ICD-10-CM | POA: Diagnosis not present

## 2017-09-26 DIAGNOSIS — N186 End stage renal disease: Secondary | ICD-10-CM | POA: Diagnosis not present

## 2017-09-28 DIAGNOSIS — N186 End stage renal disease: Secondary | ICD-10-CM | POA: Diagnosis not present

## 2017-09-28 DIAGNOSIS — D509 Iron deficiency anemia, unspecified: Secondary | ICD-10-CM | POA: Diagnosis not present

## 2017-09-28 DIAGNOSIS — N2581 Secondary hyperparathyroidism of renal origin: Secondary | ICD-10-CM | POA: Diagnosis not present

## 2017-09-28 DIAGNOSIS — D631 Anemia in chronic kidney disease: Secondary | ICD-10-CM | POA: Diagnosis not present

## 2017-09-28 DIAGNOSIS — Z23 Encounter for immunization: Secondary | ICD-10-CM | POA: Diagnosis not present

## 2017-09-28 DIAGNOSIS — Z992 Dependence on renal dialysis: Secondary | ICD-10-CM | POA: Diagnosis not present

## 2017-09-29 DIAGNOSIS — L03116 Cellulitis of left lower limb: Secondary | ICD-10-CM | POA: Diagnosis not present

## 2017-09-29 DIAGNOSIS — S9032XA Contusion of left foot, initial encounter: Secondary | ICD-10-CM | POA: Diagnosis not present

## 2017-09-29 DIAGNOSIS — L97529 Non-pressure chronic ulcer of other part of left foot with unspecified severity: Secondary | ICD-10-CM | POA: Diagnosis not present

## 2017-09-30 DIAGNOSIS — N2581 Secondary hyperparathyroidism of renal origin: Secondary | ICD-10-CM | POA: Diagnosis not present

## 2017-09-30 DIAGNOSIS — D631 Anemia in chronic kidney disease: Secondary | ICD-10-CM | POA: Diagnosis not present

## 2017-09-30 DIAGNOSIS — D509 Iron deficiency anemia, unspecified: Secondary | ICD-10-CM | POA: Diagnosis not present

## 2017-09-30 DIAGNOSIS — N186 End stage renal disease: Secondary | ICD-10-CM | POA: Diagnosis not present

## 2017-09-30 DIAGNOSIS — Z23 Encounter for immunization: Secondary | ICD-10-CM | POA: Diagnosis not present

## 2017-09-30 DIAGNOSIS — Z992 Dependence on renal dialysis: Secondary | ICD-10-CM | POA: Diagnosis not present

## 2017-10-02 DIAGNOSIS — D509 Iron deficiency anemia, unspecified: Secondary | ICD-10-CM | POA: Diagnosis not present

## 2017-10-02 DIAGNOSIS — N186 End stage renal disease: Secondary | ICD-10-CM | POA: Diagnosis not present

## 2017-10-02 DIAGNOSIS — Z992 Dependence on renal dialysis: Secondary | ICD-10-CM | POA: Diagnosis not present

## 2017-10-02 DIAGNOSIS — D631 Anemia in chronic kidney disease: Secondary | ICD-10-CM | POA: Diagnosis not present

## 2017-10-02 DIAGNOSIS — Z23 Encounter for immunization: Secondary | ICD-10-CM | POA: Diagnosis not present

## 2017-10-02 DIAGNOSIS — N2581 Secondary hyperparathyroidism of renal origin: Secondary | ICD-10-CM | POA: Diagnosis not present

## 2017-10-05 DIAGNOSIS — N2581 Secondary hyperparathyroidism of renal origin: Secondary | ICD-10-CM | POA: Diagnosis not present

## 2017-10-05 DIAGNOSIS — D631 Anemia in chronic kidney disease: Secondary | ICD-10-CM | POA: Diagnosis not present

## 2017-10-05 DIAGNOSIS — Z23 Encounter for immunization: Secondary | ICD-10-CM | POA: Diagnosis not present

## 2017-10-05 DIAGNOSIS — Z992 Dependence on renal dialysis: Secondary | ICD-10-CM | POA: Diagnosis not present

## 2017-10-05 DIAGNOSIS — N186 End stage renal disease: Secondary | ICD-10-CM | POA: Diagnosis not present

## 2017-10-05 DIAGNOSIS — D509 Iron deficiency anemia, unspecified: Secondary | ICD-10-CM | POA: Diagnosis not present

## 2017-10-07 DIAGNOSIS — Z23 Encounter for immunization: Secondary | ICD-10-CM | POA: Diagnosis not present

## 2017-10-07 DIAGNOSIS — D631 Anemia in chronic kidney disease: Secondary | ICD-10-CM | POA: Diagnosis not present

## 2017-10-07 DIAGNOSIS — N2581 Secondary hyperparathyroidism of renal origin: Secondary | ICD-10-CM | POA: Diagnosis not present

## 2017-10-07 DIAGNOSIS — D509 Iron deficiency anemia, unspecified: Secondary | ICD-10-CM | POA: Diagnosis not present

## 2017-10-07 DIAGNOSIS — N186 End stage renal disease: Secondary | ICD-10-CM | POA: Diagnosis not present

## 2017-10-07 DIAGNOSIS — Z992 Dependence on renal dialysis: Secondary | ICD-10-CM | POA: Diagnosis not present

## 2017-10-09 DIAGNOSIS — Z992 Dependence on renal dialysis: Secondary | ICD-10-CM | POA: Diagnosis not present

## 2017-10-09 DIAGNOSIS — Z23 Encounter for immunization: Secondary | ICD-10-CM | POA: Diagnosis not present

## 2017-10-09 DIAGNOSIS — D631 Anemia in chronic kidney disease: Secondary | ICD-10-CM | POA: Diagnosis not present

## 2017-10-09 DIAGNOSIS — N2581 Secondary hyperparathyroidism of renal origin: Secondary | ICD-10-CM | POA: Diagnosis not present

## 2017-10-09 DIAGNOSIS — N186 End stage renal disease: Secondary | ICD-10-CM | POA: Diagnosis not present

## 2017-10-09 DIAGNOSIS — D509 Iron deficiency anemia, unspecified: Secondary | ICD-10-CM | POA: Diagnosis not present

## 2017-10-11 ENCOUNTER — Other Ambulatory Visit: Payer: Self-pay | Admitting: Family Medicine

## 2017-10-11 NOTE — Telephone Encounter (Signed)
Please see this refill request. Thanks !

## 2017-10-12 DIAGNOSIS — D631 Anemia in chronic kidney disease: Secondary | ICD-10-CM | POA: Diagnosis not present

## 2017-10-12 DIAGNOSIS — N186 End stage renal disease: Secondary | ICD-10-CM | POA: Diagnosis not present

## 2017-10-12 DIAGNOSIS — D509 Iron deficiency anemia, unspecified: Secondary | ICD-10-CM | POA: Diagnosis not present

## 2017-10-12 DIAGNOSIS — Z992 Dependence on renal dialysis: Secondary | ICD-10-CM | POA: Diagnosis not present

## 2017-10-12 DIAGNOSIS — N2581 Secondary hyperparathyroidism of renal origin: Secondary | ICD-10-CM | POA: Diagnosis not present

## 2017-10-12 DIAGNOSIS — Z23 Encounter for immunization: Secondary | ICD-10-CM | POA: Diagnosis not present

## 2017-10-14 DIAGNOSIS — Z992 Dependence on renal dialysis: Secondary | ICD-10-CM | POA: Diagnosis not present

## 2017-10-14 DIAGNOSIS — D631 Anemia in chronic kidney disease: Secondary | ICD-10-CM | POA: Diagnosis not present

## 2017-10-14 DIAGNOSIS — D509 Iron deficiency anemia, unspecified: Secondary | ICD-10-CM | POA: Diagnosis not present

## 2017-10-14 DIAGNOSIS — N186 End stage renal disease: Secondary | ICD-10-CM | POA: Diagnosis not present

## 2017-10-14 DIAGNOSIS — N2581 Secondary hyperparathyroidism of renal origin: Secondary | ICD-10-CM | POA: Diagnosis not present

## 2017-10-14 DIAGNOSIS — Z23 Encounter for immunization: Secondary | ICD-10-CM | POA: Diagnosis not present

## 2017-10-16 DIAGNOSIS — D631 Anemia in chronic kidney disease: Secondary | ICD-10-CM | POA: Diagnosis not present

## 2017-10-16 DIAGNOSIS — D509 Iron deficiency anemia, unspecified: Secondary | ICD-10-CM | POA: Diagnosis not present

## 2017-10-16 DIAGNOSIS — N2581 Secondary hyperparathyroidism of renal origin: Secondary | ICD-10-CM | POA: Diagnosis not present

## 2017-10-16 DIAGNOSIS — Z992 Dependence on renal dialysis: Secondary | ICD-10-CM | POA: Diagnosis not present

## 2017-10-16 DIAGNOSIS — N186 End stage renal disease: Secondary | ICD-10-CM | POA: Diagnosis not present

## 2017-10-16 DIAGNOSIS — Z23 Encounter for immunization: Secondary | ICD-10-CM | POA: Diagnosis not present

## 2017-10-19 ENCOUNTER — Other Ambulatory Visit (INDEPENDENT_AMBULATORY_CARE_PROVIDER_SITE_OTHER): Payer: Self-pay | Admitting: Vascular Surgery

## 2017-10-19 MED ORDER — CEFAZOLIN SODIUM-DEXTROSE 1-4 GM/50ML-% IV SOLN
1.0000 g | Freq: Once | INTRAVENOUS | Status: AC
  Administered 2017-10-20: 1 g via INTRAVENOUS

## 2017-10-20 ENCOUNTER — Encounter (INDEPENDENT_AMBULATORY_CARE_PROVIDER_SITE_OTHER): Payer: Medicare Other

## 2017-10-20 ENCOUNTER — Ambulatory Visit (INDEPENDENT_AMBULATORY_CARE_PROVIDER_SITE_OTHER): Payer: Medicare Other | Admitting: Vascular Surgery

## 2017-10-20 ENCOUNTER — Encounter: Payer: Self-pay | Admitting: *Deleted

## 2017-10-20 ENCOUNTER — Ambulatory Visit
Admission: RE | Admit: 2017-10-20 | Discharge: 2017-10-20 | Disposition: A | Discharge status: Home / Self Care | Payer: Medicare Other | Source: Ambulatory Visit | Attending: Vascular Surgery | Admitting: Vascular Surgery

## 2017-10-20 ENCOUNTER — Encounter
Admission: RE | Disposition: A | Discharge status: Home / Self Care | Payer: Self-pay | Source: Ambulatory Visit | Attending: Vascular Surgery

## 2017-10-20 DIAGNOSIS — E785 Hyperlipidemia, unspecified: Secondary | ICD-10-CM | POA: Insufficient documentation

## 2017-10-20 DIAGNOSIS — J449 Chronic obstructive pulmonary disease, unspecified: Secondary | ICD-10-CM | POA: Diagnosis not present

## 2017-10-20 DIAGNOSIS — F419 Anxiety disorder, unspecified: Secondary | ICD-10-CM | POA: Insufficient documentation

## 2017-10-20 DIAGNOSIS — Z23 Encounter for immunization: Secondary | ICD-10-CM | POA: Diagnosis not present

## 2017-10-20 DIAGNOSIS — E119 Type 2 diabetes mellitus without complications: Secondary | ICD-10-CM | POA: Diagnosis not present

## 2017-10-20 DIAGNOSIS — I959 Hypotension, unspecified: Secondary | ICD-10-CM | POA: Diagnosis not present

## 2017-10-20 DIAGNOSIS — Y832 Surgical operation with anastomosis, bypass or graft as the cause of abnormal reaction of the patient, or of later complication, without mention of misadventure at the time of the procedure: Secondary | ICD-10-CM | POA: Diagnosis not present

## 2017-10-20 DIAGNOSIS — E039 Hypothyroidism, unspecified: Secondary | ICD-10-CM | POA: Diagnosis not present

## 2017-10-20 DIAGNOSIS — I871 Compression of vein: Secondary | ICD-10-CM | POA: Diagnosis not present

## 2017-10-20 DIAGNOSIS — N186 End stage renal disease: Secondary | ICD-10-CM | POA: Insufficient documentation

## 2017-10-20 DIAGNOSIS — Z9981 Dependence on supplemental oxygen: Secondary | ICD-10-CM | POA: Diagnosis not present

## 2017-10-20 DIAGNOSIS — Z87891 Personal history of nicotine dependence: Secondary | ICD-10-CM | POA: Diagnosis not present

## 2017-10-20 DIAGNOSIS — I132 Hypertensive heart and chronic kidney disease with heart failure and with stage 5 chronic kidney disease, or end stage renal disease: Secondary | ICD-10-CM | POA: Diagnosis not present

## 2017-10-20 DIAGNOSIS — E1122 Type 2 diabetes mellitus with diabetic chronic kidney disease: Secondary | ICD-10-CM | POA: Diagnosis not present

## 2017-10-20 DIAGNOSIS — E538 Deficiency of other specified B group vitamins: Secondary | ICD-10-CM | POA: Insufficient documentation

## 2017-10-20 DIAGNOSIS — T82868A Thrombosis of vascular prosthetic devices, implants and grafts, initial encounter: Secondary | ICD-10-CM | POA: Diagnosis not present

## 2017-10-20 DIAGNOSIS — T82858A Stenosis of vascular prosthetic devices, implants and grafts, initial encounter: Secondary | ICD-10-CM | POA: Insufficient documentation

## 2017-10-20 DIAGNOSIS — K219 Gastro-esophageal reflux disease without esophagitis: Secondary | ICD-10-CM | POA: Diagnosis not present

## 2017-10-20 DIAGNOSIS — I953 Hypotension of hemodialysis: Secondary | ICD-10-CM | POA: Insufficient documentation

## 2017-10-20 DIAGNOSIS — D631 Anemia in chronic kidney disease: Secondary | ICD-10-CM | POA: Diagnosis not present

## 2017-10-20 DIAGNOSIS — I5032 Chronic diastolic (congestive) heart failure: Secondary | ICD-10-CM | POA: Insufficient documentation

## 2017-10-20 DIAGNOSIS — D509 Iron deficiency anemia, unspecified: Secondary | ICD-10-CM | POA: Diagnosis not present

## 2017-10-20 DIAGNOSIS — N2581 Secondary hyperparathyroidism of renal origin: Secondary | ICD-10-CM | POA: Diagnosis not present

## 2017-10-20 DIAGNOSIS — Z8249 Family history of ischemic heart disease and other diseases of the circulatory system: Secondary | ICD-10-CM | POA: Diagnosis not present

## 2017-10-20 DIAGNOSIS — Z992 Dependence on renal dialysis: Secondary | ICD-10-CM | POA: Diagnosis not present

## 2017-10-20 HISTORY — PX: PERIPHERAL VASCULAR THROMBECTOMY: CATH118306

## 2017-10-20 LAB — POTASSIUM (ARMC VASCULAR LAB ONLY): POTASSIUM (ARMC VASCULAR LAB): 5 (ref 3.5–5.1)

## 2017-10-20 SURGERY — PERIPHERAL VASCULAR THROMBECTOMY
Anesthesia: Moderate Sedation | Laterality: Left

## 2017-10-20 MED ORDER — MIDAZOLAM HCL 2 MG/2ML IJ SOLN
INTRAMUSCULAR | Status: DC | PRN
  Administered 2017-10-20: 1 mg via INTRAVENOUS
  Administered 2017-10-20: 2 mg via INTRAVENOUS

## 2017-10-20 MED ORDER — METHYLPREDNISOLONE SODIUM SUCC 125 MG IJ SOLR: 125.0000 mg | INTRAMUSCULAR | Status: DC | PRN

## 2017-10-20 MED ORDER — FAMOTIDINE 20 MG PO TABS: 40.0000 mg | ORAL_TABLET | ORAL | Status: DC | PRN

## 2017-10-20 MED ORDER — BIVALIRUDIN BOLUS VIA INFUSION - CUPID
INTRAVENOUS | Status: DC | PRN
  Administered 2017-10-20: 43.5 mg via INTRAVENOUS

## 2017-10-20 MED ORDER — BIVALIRUDIN TRIFLUOROACETATE 250 MG IV SOLR
INTRAVENOUS | Status: AC
  Filled 2017-10-20: qty 250

## 2017-10-20 MED ORDER — FENTANYL CITRATE (PF) 100 MCG/2ML IJ SOLN
INTRAMUSCULAR | Status: AC
  Filled 2017-10-20: qty 2

## 2017-10-20 MED ORDER — MIDAZOLAM HCL 5 MG/5ML IJ SOLN
INTRAMUSCULAR | Status: AC
  Filled 2017-10-20: qty 5

## 2017-10-20 MED ORDER — SODIUM CHLORIDE 0.9 % IV SOLN: INTRAVENOUS | Status: DC

## 2017-10-20 MED ORDER — ONDANSETRON HCL 4 MG/2ML IJ SOLN: 4.0000 mg | Freq: Four times a day (QID) | INTRAMUSCULAR | Status: DC | PRN

## 2017-10-20 MED ORDER — FENTANYL CITRATE (PF) 100 MCG/2ML IJ SOLN
INTRAMUSCULAR | Status: DC | PRN
  Administered 2017-10-20: 25 ug via INTRAVENOUS
  Administered 2017-10-20: 50 ug via INTRAVENOUS

## 2017-10-20 MED ORDER — LIDOCAINE-EPINEPHRINE (PF) 1 %-1:200000 IJ SOLN
INTRAMUSCULAR | Status: AC
  Filled 2017-10-20: qty 30

## 2017-10-20 MED ORDER — IOPAMIDOL (ISOVUE-300) INJECTION 61%
INTRAVENOUS | Status: DC | PRN
  Administered 2017-10-20: 35 mL via INTRA_ARTERIAL

## 2017-10-20 MED ORDER — HYDROMORPHONE HCL 1 MG/ML IJ SOLN: 1.0000 mg | Freq: Once | INTRAMUSCULAR | Status: DC | PRN

## 2017-10-20 SURGICAL SUPPLY — 24 items
BALLN DORADO 6X80X80 (BALLOONS) ×3
BALLN DORADO 8X60X80 (BALLOONS) ×3
BALLN LUTONIX AV 9X60X75 (BALLOONS) ×3
BALLN ULTRVRSE 12X60X75 (BALLOONS) ×3
BALLOON DORADO 6X80X80 (BALLOONS) IMPLANT
BALLOON DORADO 8X60X80 (BALLOONS) IMPLANT
BALLOON LUTONIX AV 9X60X75 (BALLOONS) IMPLANT
BALLOON ULTRVRSE 12X60X75 (BALLOONS) IMPLANT
CANNULA 5F STIFF (CANNULA) ×2 IMPLANT
CATH BEACON 5 .035 40 KMP TP (CATHETERS) IMPLANT
CATH BEACON 5 .038 40 KMP TP (CATHETERS) ×2
CATH EMBOLECTOMY 5FR (BALLOONS) ×2 IMPLANT
COVER PROBE U/S 5X48 (MISCELLANEOUS) ×2 IMPLANT
DEVICE PRESTO INFLATION (MISCELLANEOUS) ×2 IMPLANT
DRAPE BRACHIAL (DRAPES) ×2 IMPLANT
PACK ANGIOGRAPHY (CUSTOM PROCEDURE TRAY) ×3 IMPLANT
SET AVX THROMB ULT (MISCELLANEOUS) ×2 IMPLANT
SHEATH BRITE TIP 6FRX5.5 (SHEATH) ×6 IMPLANT
SHEATH BRITE TIP 7FRX5.5 (SHEATH) ×2 IMPLANT
SUT MNCRL AB 4-0 PS2 18 (SUTURE) ×2 IMPLANT
SYR MEDRAD MARK V 150ML (SYRINGE) ×2 IMPLANT
TUBING CONTRAST HIGH PRESS 72 (TUBING) ×3 IMPLANT
WIRE J 3MM .035X145CM (WIRE) ×3 IMPLANT
WIRE MAGIC TOR.035 180C (WIRE) ×6 IMPLANT

## 2017-10-20 NOTE — Discharge Instructions (Signed)
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
## 2017-10-20 NOTE — Op Note (Signed)
[facility]    OPERATIVE NOTE   PROCEDURE: 1.  Left brachial artery to axillary vein arteriovenous graft cannulation under ultrasound guidance in both a retrograde and then antegrade fashion crossing 2.  Left arm shuntogram and central venogram 3.  Catheter directed thrombolysis with 4 mg of TPA delivered with the AngioJet AVX catheter 4.  Mechanical rheolytic thrombectomy to the left brachial artery to axillary vein AV graft in the axillary and subclavian veins with the AngioJet AVX catheter 5.  Fogarty embolectomy for residual arterial plug 6.  Percutaneous transluminal angioplasty of arterial anastomosis with 6 mm diameter by 8 cm length Lutonix drug-coated angioplasty balloon 7.  Percutaneous transluminal angioplasty of the mid to distal graft with 8 mm diameter by 6 cm length high-pressure angioplasty balloon 8.  Percutaneous transluminal angioplasty of the left subclavian vein just proximal to the previously placed stents with 9 mm diameter Lutonix drug-coated and 12 mm diameter conventional angioplasty balloon  PRE-OPERATIVE DIAGNOSIS: 1. ESRD 2.  Thrombosed left brachial artery to axillary vein arteriovenous graft  POST-OPERATIVE DIAGNOSIS: same as above   SURGEON: Leotis Pain, MD  ANESTHESIA: local with Moderate Conscious Sedation for approximately 50 minutes using 3 mg of Versed and 75 Mcg of Fentanyl  ESTIMATED BLOOD LOSS: 15 cc  FINDING(S): 1. Thrombosed AV graft.  Stenosis in the subclavian vein just proximal to the previously placed stents of greater than 90% which was likely the cause of the failure  SPECIMEN(S):  None  CONTRAST: 35 cc  FLUORO TIME: 4.9 minutes  INDICATIONS: Patient is a 67 y.o.female who presents with a thrombosed left brachial artery to axillary vein arteriovenous graft.  The patient is scheduled for an attempted declot and shuntogram.  The patient is aware the risks include but are not limited to: bleeding, infection,  thrombosis of the cannulated access, and possible anaphylactic reaction to the contrast.  The patient is aware of the risks of the procedure and elects to proceed forward.  DESCRIPTION: After full informed written consent was obtained, the patient was brought back to the angiography suite and placed supine upon the angiography table.  The patient was connected to monitoring equipment. Moderate conscious sedation was administered with a face to face encounter with the patient throughout the procedure with my supervision of the RN administering medicines and monitoring the patient's vital signs, pulse oximetry, telemetry and mental status throughout from the start of the procedure until the patient was taken to the recovery room. The left arm was prepped and draped in the standard fashion for a percutaneous access intervention.  Under ultrasound guidance, the left brachial artery to axillary vein arteriovenous graft was cannulated with a micropuncture needle under direct ultrasound guidance due to the pulseless nature of the graft in both an antegrade and a retrograde fashion crossing, and permanent images were performed.  The microwire was advanced and the needle was exchanged for the a microsheath.  I then upsized to a 6 Fr Sheath and imaging was performed.  Hand injections were completed to image the access including the central venous system. This demonstrated no flow within the AV graft.  Based on the images, this patient will need extensive treatment to salvage the graft. I then gave the patient Angiomax.  I then placed a Magic torque wire into the brachial artery from the retrograde sheath and into the subclavian vein from the antegrade sheath. 4 mg of TPA were deployed throughout the entirety of the graft and into the subclavian vein. This  was allowed to dwell and then mechanical rheolytic thrombectomy was then performed throughout the graft and into the axillary and subclavian vein. This uncovered a  high-grade stenosis in the subclavian vein just proximal to the previously placed stents of 90%, thrombus and stenosis around the retrograde sheath in the mid to distal graft.  A residual arterial plug was also seen at the arterial anastomosis. An attempt to clear the arterial plug was done with 2 passes of the Fogarty embolectomy balloon. Flow-limiting arterial plug remained, and I elected to treat this lesion with a 6 mm diameter by 8 cm length Lutonix drug-coated angioplasty balloon inflated to 10 atm for 1 minute. This resulted in resolution of the arterial plug, and clearance of the arterial side of the graft. The arterial outflow was seen to be intact through the radial and ulnar arteries as well on these images. The retrograde sheath was removed. I then turned my attention to the thrombus in the distal graft and the axillary and subclavian vein. Mechanical rheolytic thrombectomy was performed. This resulted in the only residual areas of disease in the mid to distal graft and the stenosis in the subclavian vein proximal to the previously placed stents.  I then elected to treat this stenosis within the graft with an 8 mm diameter by 6 cm length high-pressure angioplasty balloon inflated to 20 atm for 1 minute.  Completion angiogram showed less than 10% residual stenosis.  I then turned my attention to the central lesion.  A 9 mm diameter by 6 cm length Lutonix drug-coated angioplasty balloon was inflated to 12 atm for 1 minute.  The waist did not completely break and greater than 50% residual stenosis remained.  I upsized to a 7 [place] sheath and then used a 12 mm diameter by 6 cm length conventional angioplasty balloon inflated to 10 atm for 1 minute.  Completion imaging showed mild haziness in the area but less than 20% residual stenosis with brisk flow through the graft.    Based on the completion imaging, no further intervention is necessary.  The wire and balloon were removed from the sheath.  A 4-0  Monocryl purse-string suture was sewn around the sheath.  The sheath was removed while tying down the suture.  A sterile bandage was applied to the puncture site.  COMPLICATIONS: None  CONDITION: Stable   Leotis Pain 10/20/2017 4:27 PM   This note was created with Dragon Medical transcription system. Any errors in dictation are purely unintentional.

## 2017-10-21 ENCOUNTER — Encounter: Payer: Self-pay | Admitting: Vascular Surgery

## 2017-10-21 DIAGNOSIS — D631 Anemia in chronic kidney disease: Secondary | ICD-10-CM | POA: Diagnosis not present

## 2017-10-21 DIAGNOSIS — N186 End stage renal disease: Secondary | ICD-10-CM | POA: Diagnosis not present

## 2017-10-21 DIAGNOSIS — Z992 Dependence on renal dialysis: Secondary | ICD-10-CM | POA: Diagnosis not present

## 2017-10-21 DIAGNOSIS — Z23 Encounter for immunization: Secondary | ICD-10-CM | POA: Diagnosis not present

## 2017-10-21 DIAGNOSIS — N2581 Secondary hyperparathyroidism of renal origin: Secondary | ICD-10-CM | POA: Diagnosis not present

## 2017-10-21 DIAGNOSIS — D509 Iron deficiency anemia, unspecified: Secondary | ICD-10-CM | POA: Diagnosis not present

## 2017-10-23 DIAGNOSIS — Z23 Encounter for immunization: Secondary | ICD-10-CM | POA: Diagnosis not present

## 2017-10-23 DIAGNOSIS — Z992 Dependence on renal dialysis: Secondary | ICD-10-CM | POA: Diagnosis not present

## 2017-10-23 DIAGNOSIS — N2581 Secondary hyperparathyroidism of renal origin: Secondary | ICD-10-CM | POA: Diagnosis not present

## 2017-10-23 DIAGNOSIS — N186 End stage renal disease: Secondary | ICD-10-CM | POA: Diagnosis not present

## 2017-10-23 DIAGNOSIS — D631 Anemia in chronic kidney disease: Secondary | ICD-10-CM | POA: Diagnosis not present

## 2017-10-23 DIAGNOSIS — D509 Iron deficiency anemia, unspecified: Secondary | ICD-10-CM | POA: Diagnosis not present

## 2017-10-25 ENCOUNTER — Other Ambulatory Visit: Payer: Self-pay | Admitting: Family Medicine

## 2017-10-25 DIAGNOSIS — L97522 Non-pressure chronic ulcer of other part of left foot with fat layer exposed: Secondary | ICD-10-CM | POA: Diagnosis not present

## 2017-10-26 DIAGNOSIS — N186 End stage renal disease: Secondary | ICD-10-CM | POA: Diagnosis not present

## 2017-10-26 DIAGNOSIS — D631 Anemia in chronic kidney disease: Secondary | ICD-10-CM | POA: Diagnosis not present

## 2017-10-26 DIAGNOSIS — N2581 Secondary hyperparathyroidism of renal origin: Secondary | ICD-10-CM | POA: Diagnosis not present

## 2017-10-26 DIAGNOSIS — Z992 Dependence on renal dialysis: Secondary | ICD-10-CM | POA: Diagnosis not present

## 2017-10-26 DIAGNOSIS — Z23 Encounter for immunization: Secondary | ICD-10-CM | POA: Diagnosis not present

## 2017-10-26 DIAGNOSIS — D509 Iron deficiency anemia, unspecified: Secondary | ICD-10-CM | POA: Diagnosis not present

## 2017-10-27 DIAGNOSIS — Z992 Dependence on renal dialysis: Secondary | ICD-10-CM | POA: Diagnosis not present

## 2017-10-27 DIAGNOSIS — N186 End stage renal disease: Secondary | ICD-10-CM | POA: Diagnosis not present

## 2017-10-28 DIAGNOSIS — D509 Iron deficiency anemia, unspecified: Secondary | ICD-10-CM | POA: Diagnosis not present

## 2017-10-28 DIAGNOSIS — N186 End stage renal disease: Secondary | ICD-10-CM | POA: Diagnosis not present

## 2017-10-28 DIAGNOSIS — Z992 Dependence on renal dialysis: Secondary | ICD-10-CM | POA: Diagnosis not present

## 2017-10-28 DIAGNOSIS — N2581 Secondary hyperparathyroidism of renal origin: Secondary | ICD-10-CM | POA: Diagnosis not present

## 2017-10-28 DIAGNOSIS — D631 Anemia in chronic kidney disease: Secondary | ICD-10-CM | POA: Diagnosis not present

## 2017-10-30 DIAGNOSIS — D509 Iron deficiency anemia, unspecified: Secondary | ICD-10-CM | POA: Diagnosis not present

## 2017-10-30 DIAGNOSIS — D631 Anemia in chronic kidney disease: Secondary | ICD-10-CM | POA: Diagnosis not present

## 2017-10-30 DIAGNOSIS — N186 End stage renal disease: Secondary | ICD-10-CM | POA: Diagnosis not present

## 2017-10-30 DIAGNOSIS — N2581 Secondary hyperparathyroidism of renal origin: Secondary | ICD-10-CM | POA: Diagnosis not present

## 2017-10-30 DIAGNOSIS — Z992 Dependence on renal dialysis: Secondary | ICD-10-CM | POA: Diagnosis not present

## 2017-11-02 DIAGNOSIS — D509 Iron deficiency anemia, unspecified: Secondary | ICD-10-CM | POA: Diagnosis not present

## 2017-11-02 DIAGNOSIS — N2581 Secondary hyperparathyroidism of renal origin: Secondary | ICD-10-CM | POA: Diagnosis not present

## 2017-11-02 DIAGNOSIS — Z992 Dependence on renal dialysis: Secondary | ICD-10-CM | POA: Diagnosis not present

## 2017-11-02 DIAGNOSIS — N186 End stage renal disease: Secondary | ICD-10-CM | POA: Diagnosis not present

## 2017-11-02 DIAGNOSIS — D631 Anemia in chronic kidney disease: Secondary | ICD-10-CM | POA: Diagnosis not present

## 2017-11-04 DIAGNOSIS — D631 Anemia in chronic kidney disease: Secondary | ICD-10-CM | POA: Diagnosis not present

## 2017-11-04 DIAGNOSIS — D509 Iron deficiency anemia, unspecified: Secondary | ICD-10-CM | POA: Diagnosis not present

## 2017-11-04 DIAGNOSIS — N2581 Secondary hyperparathyroidism of renal origin: Secondary | ICD-10-CM | POA: Diagnosis not present

## 2017-11-04 DIAGNOSIS — N186 End stage renal disease: Secondary | ICD-10-CM | POA: Diagnosis not present

## 2017-11-04 DIAGNOSIS — Z992 Dependence on renal dialysis: Secondary | ICD-10-CM | POA: Diagnosis not present

## 2017-11-06 DIAGNOSIS — D631 Anemia in chronic kidney disease: Secondary | ICD-10-CM | POA: Diagnosis not present

## 2017-11-06 DIAGNOSIS — N186 End stage renal disease: Secondary | ICD-10-CM | POA: Diagnosis not present

## 2017-11-06 DIAGNOSIS — N2581 Secondary hyperparathyroidism of renal origin: Secondary | ICD-10-CM | POA: Diagnosis not present

## 2017-11-06 DIAGNOSIS — D509 Iron deficiency anemia, unspecified: Secondary | ICD-10-CM | POA: Diagnosis not present

## 2017-11-06 DIAGNOSIS — Z992 Dependence on renal dialysis: Secondary | ICD-10-CM | POA: Diagnosis not present

## 2017-11-09 DIAGNOSIS — Z992 Dependence on renal dialysis: Secondary | ICD-10-CM | POA: Diagnosis not present

## 2017-11-09 DIAGNOSIS — D509 Iron deficiency anemia, unspecified: Secondary | ICD-10-CM | POA: Diagnosis not present

## 2017-11-09 DIAGNOSIS — N2581 Secondary hyperparathyroidism of renal origin: Secondary | ICD-10-CM | POA: Diagnosis not present

## 2017-11-09 DIAGNOSIS — D631 Anemia in chronic kidney disease: Secondary | ICD-10-CM | POA: Diagnosis not present

## 2017-11-09 DIAGNOSIS — N186 End stage renal disease: Secondary | ICD-10-CM | POA: Diagnosis not present

## 2017-11-11 DIAGNOSIS — Z992 Dependence on renal dialysis: Secondary | ICD-10-CM | POA: Diagnosis not present

## 2017-11-11 DIAGNOSIS — D631 Anemia in chronic kidney disease: Secondary | ICD-10-CM | POA: Diagnosis not present

## 2017-11-11 DIAGNOSIS — N186 End stage renal disease: Secondary | ICD-10-CM | POA: Diagnosis not present

## 2017-11-11 DIAGNOSIS — D509 Iron deficiency anemia, unspecified: Secondary | ICD-10-CM | POA: Diagnosis not present

## 2017-11-11 DIAGNOSIS — N2581 Secondary hyperparathyroidism of renal origin: Secondary | ICD-10-CM | POA: Diagnosis not present

## 2017-11-13 DIAGNOSIS — N2581 Secondary hyperparathyroidism of renal origin: Secondary | ICD-10-CM | POA: Diagnosis not present

## 2017-11-13 DIAGNOSIS — D509 Iron deficiency anemia, unspecified: Secondary | ICD-10-CM | POA: Diagnosis not present

## 2017-11-13 DIAGNOSIS — D631 Anemia in chronic kidney disease: Secondary | ICD-10-CM | POA: Diagnosis not present

## 2017-11-13 DIAGNOSIS — Z992 Dependence on renal dialysis: Secondary | ICD-10-CM | POA: Diagnosis not present

## 2017-11-13 DIAGNOSIS — N186 End stage renal disease: Secondary | ICD-10-CM | POA: Diagnosis not present

## 2017-11-15 DIAGNOSIS — L97522 Non-pressure chronic ulcer of other part of left foot with fat layer exposed: Secondary | ICD-10-CM | POA: Diagnosis not present

## 2017-11-16 DIAGNOSIS — N186 End stage renal disease: Secondary | ICD-10-CM | POA: Diagnosis not present

## 2017-11-16 DIAGNOSIS — D509 Iron deficiency anemia, unspecified: Secondary | ICD-10-CM | POA: Diagnosis not present

## 2017-11-16 DIAGNOSIS — Z992 Dependence on renal dialysis: Secondary | ICD-10-CM | POA: Diagnosis not present

## 2017-11-16 DIAGNOSIS — N2581 Secondary hyperparathyroidism of renal origin: Secondary | ICD-10-CM | POA: Diagnosis not present

## 2017-11-16 DIAGNOSIS — D631 Anemia in chronic kidney disease: Secondary | ICD-10-CM | POA: Diagnosis not present

## 2017-11-18 DIAGNOSIS — N2581 Secondary hyperparathyroidism of renal origin: Secondary | ICD-10-CM | POA: Diagnosis not present

## 2017-11-18 DIAGNOSIS — Z992 Dependence on renal dialysis: Secondary | ICD-10-CM | POA: Diagnosis not present

## 2017-11-18 DIAGNOSIS — N186 End stage renal disease: Secondary | ICD-10-CM | POA: Diagnosis not present

## 2017-11-18 DIAGNOSIS — D509 Iron deficiency anemia, unspecified: Secondary | ICD-10-CM | POA: Diagnosis not present

## 2017-11-18 DIAGNOSIS — D631 Anemia in chronic kidney disease: Secondary | ICD-10-CM | POA: Diagnosis not present

## 2017-11-19 ENCOUNTER — Other Ambulatory Visit: Payer: Self-pay | Admitting: Family Medicine

## 2017-11-19 NOTE — Telephone Encounter (Signed)
[id number] see refills requested/thx dmf

## 2017-11-20 DIAGNOSIS — D631 Anemia in chronic kidney disease: Secondary | ICD-10-CM | POA: Diagnosis not present

## 2017-11-20 DIAGNOSIS — N186 End stage renal disease: Secondary | ICD-10-CM | POA: Diagnosis not present

## 2017-11-20 DIAGNOSIS — N2581 Secondary hyperparathyroidism of renal origin: Secondary | ICD-10-CM | POA: Diagnosis not present

## 2017-11-20 DIAGNOSIS — Z992 Dependence on renal dialysis: Secondary | ICD-10-CM | POA: Diagnosis not present

## 2017-11-20 DIAGNOSIS — D509 Iron deficiency anemia, unspecified: Secondary | ICD-10-CM | POA: Diagnosis not present

## 2017-11-23 ENCOUNTER — Encounter (INDEPENDENT_AMBULATORY_CARE_PROVIDER_SITE_OTHER): Payer: Self-pay

## 2017-11-24 ENCOUNTER — Encounter
Admission: RE | Disposition: A | Discharge status: Home / Self Care | Payer: Self-pay | Source: Ambulatory Visit | Attending: Vascular Surgery

## 2017-11-24 ENCOUNTER — Other Ambulatory Visit (INDEPENDENT_AMBULATORY_CARE_PROVIDER_SITE_OTHER): Payer: Self-pay | Admitting: Nurse Practitioner

## 2017-11-24 ENCOUNTER — Ambulatory Visit
Admission: RE | Admit: 2017-11-24 | Discharge: 2017-11-24 | Disposition: A | Discharge status: Home / Self Care | Payer: Medicare Other | Source: Ambulatory Visit | Attending: Vascular Surgery | Admitting: Vascular Surgery

## 2017-11-24 DIAGNOSIS — J449 Chronic obstructive pulmonary disease, unspecified: Secondary | ICD-10-CM | POA: Diagnosis not present

## 2017-11-24 DIAGNOSIS — K219 Gastro-esophageal reflux disease without esophagitis: Secondary | ICD-10-CM | POA: Insufficient documentation

## 2017-11-24 DIAGNOSIS — Z95820 Peripheral vascular angioplasty status with implants and grafts: Secondary | ICD-10-CM | POA: Insufficient documentation

## 2017-11-24 DIAGNOSIS — I132 Hypertensive heart and chronic kidney disease with heart failure and with stage 5 chronic kidney disease, or end stage renal disease: Secondary | ICD-10-CM | POA: Diagnosis not present

## 2017-11-24 DIAGNOSIS — Z90710 Acquired absence of both cervix and uterus: Secondary | ICD-10-CM | POA: Diagnosis not present

## 2017-11-24 DIAGNOSIS — E039 Hypothyroidism, unspecified: Secondary | ICD-10-CM | POA: Insufficient documentation

## 2017-11-24 DIAGNOSIS — D631 Anemia in chronic kidney disease: Secondary | ICD-10-CM | POA: Diagnosis not present

## 2017-11-24 DIAGNOSIS — I10 Essential (primary) hypertension: Secondary | ICD-10-CM | POA: Diagnosis not present

## 2017-11-24 DIAGNOSIS — I714 Abdominal aortic aneurysm, without rupture: Secondary | ICD-10-CM | POA: Insufficient documentation

## 2017-11-24 DIAGNOSIS — Z85828 Personal history of other malignant neoplasm of skin: Secondary | ICD-10-CM | POA: Diagnosis not present

## 2017-11-24 DIAGNOSIS — Z8619 Personal history of other infectious and parasitic diseases: Secondary | ICD-10-CM | POA: Insufficient documentation

## 2017-11-24 DIAGNOSIS — Z87891 Personal history of nicotine dependence: Secondary | ICD-10-CM | POA: Diagnosis not present

## 2017-11-24 DIAGNOSIS — T82868A Thrombosis of vascular prosthetic devices, implants and grafts, initial encounter: Secondary | ICD-10-CM | POA: Diagnosis not present

## 2017-11-24 DIAGNOSIS — Z823 Family history of stroke: Secondary | ICD-10-CM | POA: Insufficient documentation

## 2017-11-24 DIAGNOSIS — D509 Iron deficiency anemia, unspecified: Secondary | ICD-10-CM | POA: Diagnosis not present

## 2017-11-24 DIAGNOSIS — Z9981 Dependence on supplemental oxygen: Secondary | ICD-10-CM | POA: Insufficient documentation

## 2017-11-24 DIAGNOSIS — Z886 Allergy status to analgesic agent status: Secondary | ICD-10-CM | POA: Diagnosis not present

## 2017-11-24 DIAGNOSIS — I5032 Chronic diastolic (congestive) heart failure: Secondary | ICD-10-CM | POA: Insufficient documentation

## 2017-11-24 DIAGNOSIS — Z933 Colostomy status: Secondary | ICD-10-CM | POA: Diagnosis not present

## 2017-11-24 DIAGNOSIS — Z8049 Family history of malignant neoplasm of other genital organs: Secondary | ICD-10-CM | POA: Insufficient documentation

## 2017-11-24 DIAGNOSIS — Z8249 Family history of ischemic heart disease and other diseases of the circulatory system: Secondary | ICD-10-CM | POA: Diagnosis not present

## 2017-11-24 DIAGNOSIS — Z888 Allergy status to other drugs, medicaments and biological substances status: Secondary | ICD-10-CM | POA: Diagnosis not present

## 2017-11-24 DIAGNOSIS — N2581 Secondary hyperparathyroidism of renal origin: Secondary | ICD-10-CM | POA: Diagnosis not present

## 2017-11-24 DIAGNOSIS — Z9889 Other specified postprocedural states: Secondary | ICD-10-CM | POA: Diagnosis not present

## 2017-11-24 DIAGNOSIS — Z8719 Personal history of other diseases of the digestive system: Secondary | ICD-10-CM | POA: Diagnosis not present

## 2017-11-24 DIAGNOSIS — E785 Hyperlipidemia, unspecified: Secondary | ICD-10-CM | POA: Insufficient documentation

## 2017-11-24 DIAGNOSIS — F419 Anxiety disorder, unspecified: Secondary | ICD-10-CM | POA: Insufficient documentation

## 2017-11-24 DIAGNOSIS — N186 End stage renal disease: Secondary | ICD-10-CM

## 2017-11-24 DIAGNOSIS — Z992 Dependence on renal dialysis: Secondary | ICD-10-CM | POA: Diagnosis not present

## 2017-11-24 DIAGNOSIS — Y832 Surgical operation with anastomosis, bypass or graft as the cause of abnormal reaction of the patient, or of later complication, without mention of misadventure at the time of the procedure: Secondary | ICD-10-CM | POA: Insufficient documentation

## 2017-11-24 HISTORY — PX: PERIPHERAL VASCULAR THROMBECTOMY: CATH118306

## 2017-11-24 LAB — POTASSIUM (ARMC VASCULAR LAB ONLY): POTASSIUM (ARMC VASCULAR LAB): 4.6 (ref 3.5–5.1)

## 2017-11-24 SURGERY — PERIPHERAL VASCULAR THROMBECTOMY
Anesthesia: Moderate Sedation | Site: Arm Upper | Laterality: Left

## 2017-11-24 MED ORDER — ALTEPLASE 2 MG IJ SOLR
INTRAMUSCULAR | Status: AC
  Filled 2017-11-24: qty 4

## 2017-11-24 MED ORDER — HYDROMORPHONE HCL 1 MG/ML IJ SOLN: 1.0000 mg | Freq: Once | INTRAMUSCULAR | Status: DC | PRN

## 2017-11-24 MED ORDER — CEFAZOLIN SODIUM-DEXTROSE 1-4 GM/50ML-% IV SOLN
INTRAVENOUS | Status: AC
  Administered 2017-11-24: 1 g via INTRAVENOUS
  Filled 2017-11-24: qty 50

## 2017-11-24 MED ORDER — FENTANYL CITRATE (PF) 100 MCG/2ML IJ SOLN
INTRAMUSCULAR | Status: DC | PRN
  Administered 2017-11-24: 50 ug via INTRAVENOUS
  Administered 2017-11-24: 25 ug via INTRAVENOUS

## 2017-11-24 MED ORDER — MIDAZOLAM HCL 2 MG/2ML IJ SOLN
INTRAMUSCULAR | Status: DC | PRN
  Administered 2017-11-24: 2 mg via INTRAVENOUS
  Administered 2017-11-24: 1 mg via INTRAVENOUS

## 2017-11-24 MED ORDER — LIDOCAINE-EPINEPHRINE (PF) 1 %-1:200000 IJ SOLN
INTRAMUSCULAR | Status: AC
  Filled 2017-11-24: qty 30

## 2017-11-24 MED ORDER — SODIUM CHLORIDE 0.9 % IV SOLN: INTRAVENOUS | Status: DC

## 2017-11-24 MED ORDER — SODIUM CHLORIDE 0.9 % IV SOLN
INTRAVENOUS | Status: DC
  Administered 2017-11-24: 09:00:00 via INTRAVENOUS

## 2017-11-24 MED ORDER — ALTEPLASE 30 MG/30 ML FOR INTERV. RAD
INTRA_ARTERIAL | Status: AC | PRN
  Administered 2017-11-24: 4 mg via INTRA_ARTERIAL

## 2017-11-24 MED ORDER — MIDAZOLAM HCL 5 MG/5ML IJ SOLN
INTRAMUSCULAR | Status: AC
  Filled 2017-11-24: qty 5

## 2017-11-24 MED ORDER — ONDANSETRON HCL 4 MG/2ML IJ SOLN: 4.0000 mg | Freq: Four times a day (QID) | INTRAMUSCULAR | Status: DC | PRN

## 2017-11-24 MED ORDER — SODIUM CHLORIDE FLUSH 0.9 % IV SOLN
INTRAVENOUS | Status: AC
  Filled 2017-11-24: qty 20

## 2017-11-24 MED ORDER — CEFAZOLIN SODIUM-DEXTROSE 1-4 GM/50ML-% IV SOLN
1.0000 g | Freq: Once | INTRAVENOUS | Status: AC
  Administered 2017-11-24: 1 g via INTRAVENOUS

## 2017-11-24 MED ORDER — BIVALIRUDIN BOLUS VIA INFUSION
INTRAVENOUS | Status: DC | PRN
  Administered 2017-11-24: 50 mg via INTRAVENOUS
  Administered 2017-11-24: 25 mg via INTRAVENOUS

## 2017-11-24 MED ORDER — IOPAMIDOL (ISOVUE-300) INJECTION 61%
INTRAVENOUS | Status: DC | PRN
  Administered 2017-11-24: 55 mL via INTRA_ARTERIAL

## 2017-11-24 MED ORDER — HEPARIN (PORCINE) IN NACL 1000-0.9 UT/500ML-% IV SOLN
INTRAVENOUS | Status: AC
  Filled 2017-11-24: qty 1000

## 2017-11-24 MED ORDER — FENTANYL CITRATE (PF) 100 MCG/2ML IJ SOLN
INTRAMUSCULAR | Status: AC
  Filled 2017-11-24: qty 2

## 2017-11-24 MED ORDER — BIVALIRUDIN TRIFLUOROACETATE 250 MG IV SOLR
INTRAVENOUS | Status: AC
  Filled 2017-11-24: qty 250

## 2017-11-24 SURGICAL SUPPLY — 26 items
BALLN DORADO 6X80X80 (BALLOONS) ×3
BALLN DORADO 8X100X80 (BALLOONS) ×3
BALLN ULTRVRSE 10X40X75 (BALLOONS) ×3
BALLN ULTRVRSE 10X60X75 (BALLOONS) ×3
BALLOON DORADO 6X80X80 (BALLOONS) IMPLANT
BALLOON DORADO 8X100X80 (BALLOONS) IMPLANT
BALLOON ULTRVRSE 10X40X75 (BALLOONS) IMPLANT
BALLOON ULTRVRSE 10X60X75 (BALLOONS) IMPLANT
CANISTER PENUMBRA ENGINE (MISCELLANEOUS) ×2 IMPLANT
CANNULA 5F STIFF (CANNULA) ×2 IMPLANT
CATH BEACON 5 .035 40 KMP TP (CATHETERS) IMPLANT
CATH BEACON 5 .038 40 KMP TP (CATHETERS) ×2
CATH EMBOLECTOMY 5FR (BALLOONS) ×2 IMPLANT
CATH INDIGO CAT6 KIT (CATHETERS) ×2 IMPLANT
CATH INDIGO SEP 6 (CATHETERS) ×2 IMPLANT
COVER PROBE U/S 5X48 (MISCELLANEOUS) ×2 IMPLANT
DEVICE PRESTO INFLATION (MISCELLANEOUS) ×2 IMPLANT
DRAPE BRACHIAL (DRAPES) ×2 IMPLANT
PACK ANGIOGRAPHY (CUSTOM PROCEDURE TRAY) ×3 IMPLANT
PREP CHG 10.5 TEAL (MISCELLANEOUS) ×2 IMPLANT
SET AVX THROMB ULT (MISCELLANEOUS) ×2 IMPLANT
SHEATH BRITE TIP 6FRX5.5 (SHEATH) ×4 IMPLANT
STENT LIFESTAR 12X40 (Permanent Stent) ×2 IMPLANT
SUT MNCRL AB 4-0 PS2 18 (SUTURE) ×2 IMPLANT
TOWEL OR 17X26 4PK STRL BLUE (TOWEL DISPOSABLE) ×2 IMPLANT
WIRE MAGIC TOR.035 180C (WIRE) ×4 IMPLANT
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
## 2017-11-24 NOTE — Op Note (Signed)
[name]    OPERATIVE NOTE   PROCEDURE: 1.  Left brachial artery to axillary vein arteriovenous graft cannulation under ultrasound guidance in both a retrograde and then antegrade fashion crossing 2.  Left arm shuntogram and central venogram 3.  Catheter directed thrombolysis with 4 mg of TPA delivered with the AngioJet AVX catheter 4.  Mechanical rheolytic thrombectomy to the left brachial artery to axillary vein AV graft with the AngioJet AVX catheter.  Also, mechanical thrombectomy to the left subclavian and axillary veins with the penumbra cat 6 device for residual thrombus 5.  Fogarty embolectomy for residual arterial plug 6.  Percutaneous transluminal angioplasty of arterial anastomosis with 6 mm diameter by 10 cm length high-pressure angioplasty balloon 7.  Percutaneous transluminal angioplasty of the mid and distal graft, venous anastomosis, and axillary and subclavian vein with 8 mm diameter by 10 cm length high-pressure angioplasty balloon 8.  Percutaneous transluminal angioplasty of the axillary and subclavian veins at the proximal edge of the previously placed stent with 8 and 10 mm diameter angioplasty balloons 9.  Self-expanding stent placement to the left subclavian and axillary veins for residual thrombus and stenosis with a 12 mm diameter by 4 cm length stent  PRE-OPERATIVE DIAGNOSIS: 1. ESRD 2.  Thrombosed left brachial artery to axillary vein arteriovenous graft  POST-OPERATIVE DIAGNOSIS: same as above   SURGEON: Leotis Pain, MD  ANESTHESIA: local with Moderate Conscious Sedation for approximately 50 minutes using 3 mg of Versed and 75 mcg of Fentanyl  ESTIMATED BLOOD LOSS: 200 cc  FINDING(S): 1. Thrombosed graft, thrombus and stenosis of the left subclavian and axillary vein at the proximal edge of the previously placed stent  SPECIMEN(S):  None  CONTRAST: 55 cc  FLUORO TIME: 8 minutes  INDICATIONS: Patient is a 67 y.o.female who  presents with a thrombosed left brachial artery to axillary vein arteriovenous graft.  The patient is scheduled for an attempted declot and shuntogram.  The patient is aware the risks include but are not limited to: bleeding, infection, thrombosis of the cannulated access, and possible anaphylactic reaction to the contrast.  The patient is aware of the risks of the procedure and elects to proceed forward.  DESCRIPTION: After full informed written consent was obtained, the patient was brought back to the angiography suite and placed supine upon the angiography table.  The patient was connected to monitoring equipment. Moderate conscious sedation was administered with a face to face encounter with the patient throughout the procedure with my supervision of the RN administering medicines and monitoring the patient's vital signs, pulse oximetry, telemetry and mental status throughout from the start of the procedure until the patient was taken to the recovery room. The left arm was prepped and draped in the standard fashion for a percutaneous access intervention.  Under ultrasound guidance, the left arteriovenous graft was cannulated with a micropuncture needle under direct ultrasound guidance due to the pulseless nature of the graft in both an antegrade and a retrograde fashion crossing, and permanent images were performed.  The microwire was advanced and the needle was exchanged for the a microsheath.  I then upsized to a 6 Fr Sheath and imaging was performed.  Hand injections were completed to image the access including the central venous system. This demonstrated no flow within the AV graft.  Based on the images, this patient will need extensive treatment to salvage the graft. I then gave the patient Angiomax due to her heparin allergy.  I then placed a  Magic torque wire into the brachial artery from the retrograde sheath and into the left axillary and subclavian vein from the antegrade sheath. 4 mg of TPA were  deployed throughout the entirety of the graft and into the subclavian vein.  Mechanical rheolytic thrombectomy was then performed throughout the graft and into the axillary and subclavia vein. This uncovered thrombus and stenosis in the previously placed stent in the mid graft as well as an area of thrombus and stenosis at the proximal edge of the most proximal previously placed stent in the axillary and subclavian veins.  A residual arterial plug was also seen at the arterial anastomosis. An attempt to clear the arterial plug was done with the passes of the Fogarty embolectomy balloon. Flow-limiting arterial plug remained, and I elected to treat this lesion with a 6 mm diameter by 10 cm length high-pressure angioplasty balloon inflated up to 16 atm for 1 minute. This resulted in resolution of the arterial plug, and clearance of the arterial side of the graft with less than 20% residual stenosis. The arterial outflow was seen to be intact through the radial and ulnar arteries as well on these images. The retrograde sheath was removed. I then turned my attention to the thrombus in the distal graft and the axillary and subclavian vein. Mechanical rheolytic thrombectomy was performed with the AngioJet AVX catheter. This resulted in minimal improvement.  I then elected to treat this with several inflations with an 8 mm x 10 cm length high-pressure angioplasty balloon inflated from between 10 and 20 atm for 1 minute.  The mid and distal graft and the venous anastomosis were now widely patent with less than 10% residual stenosis and no residual thrombus, but a blob of thrombus that was flow-limiting remained at the proximal edge of the previously placed stent in the axillary and subclavian vein another pass with the AngioJet device was unsuccessful as well as increasing the balloon size to a 10 mm diameter by 8 cm length balloon inflated to 10 atm for 1 minute.  I then tried a pass with the penumbra cat 6 device with  minimal improvement seen and now most of the thrombus and stenosis seen within the actual subclavian vein.  A 12 mm diameter by 4 cm length stent was then deployed over this lesion and postdilated with a 10 mm balloon in the axillary and subclavian veins.  Completion imaging showed less than 10% residual stenosis and no residual thrombus with brisk flow in the graft.    Based on the completion imaging, no further intervention is necessary.  The wire and balloon were removed from the sheath.  A 4-0 Monocryl purse-string suture was sewn around the sheath.  The sheath was removed while tying down the suture.  A sterile bandage was applied to the puncture site.  COMPLICATIONS: None  CONDITION: Stable   Leotis Pain 11/24/2017 12:38 PM   This note was created with Dragon Medical transcription system. Any errors in dictation are purely unintentional.

## 2017-11-24 NOTE — Progress Notes (Signed)
Dr. Lucky Cowboy stopped by at bedside and spoke with pt./husband re: results of procedure.

## 2017-11-25 ENCOUNTER — Encounter: Payer: Self-pay | Admitting: Vascular Surgery

## 2017-11-25 DIAGNOSIS — D631 Anemia in chronic kidney disease: Secondary | ICD-10-CM | POA: Diagnosis not present

## 2017-11-25 DIAGNOSIS — N186 End stage renal disease: Secondary | ICD-10-CM | POA: Diagnosis not present

## 2017-11-25 DIAGNOSIS — Z992 Dependence on renal dialysis: Secondary | ICD-10-CM | POA: Diagnosis not present

## 2017-11-25 DIAGNOSIS — D509 Iron deficiency anemia, unspecified: Secondary | ICD-10-CM | POA: Diagnosis not present

## 2017-11-25 DIAGNOSIS — N2581 Secondary hyperparathyroidism of renal origin: Secondary | ICD-10-CM | POA: Diagnosis not present

## 2017-11-27 DIAGNOSIS — D631 Anemia in chronic kidney disease: Secondary | ICD-10-CM | POA: Diagnosis not present

## 2017-11-27 DIAGNOSIS — Z992 Dependence on renal dialysis: Secondary | ICD-10-CM | POA: Diagnosis not present

## 2017-11-27 DIAGNOSIS — D509 Iron deficiency anemia, unspecified: Secondary | ICD-10-CM | POA: Diagnosis not present

## 2017-11-27 DIAGNOSIS — N2581 Secondary hyperparathyroidism of renal origin: Secondary | ICD-10-CM | POA: Diagnosis not present

## 2017-11-27 DIAGNOSIS — N186 End stage renal disease: Secondary | ICD-10-CM | POA: Diagnosis not present

## 2017-11-30 DIAGNOSIS — N186 End stage renal disease: Secondary | ICD-10-CM | POA: Diagnosis not present

## 2017-11-30 DIAGNOSIS — D509 Iron deficiency anemia, unspecified: Secondary | ICD-10-CM | POA: Diagnosis not present

## 2017-11-30 DIAGNOSIS — N2581 Secondary hyperparathyroidism of renal origin: Secondary | ICD-10-CM | POA: Diagnosis not present

## 2017-11-30 DIAGNOSIS — Z992 Dependence on renal dialysis: Secondary | ICD-10-CM | POA: Diagnosis not present

## 2017-11-30 DIAGNOSIS — Z23 Encounter for immunization: Secondary | ICD-10-CM | POA: Diagnosis not present

## 2017-11-30 DIAGNOSIS — D631 Anemia in chronic kidney disease: Secondary | ICD-10-CM | POA: Diagnosis not present

## 2017-12-02 DIAGNOSIS — N186 End stage renal disease: Secondary | ICD-10-CM | POA: Diagnosis not present

## 2017-12-02 DIAGNOSIS — Z23 Encounter for immunization: Secondary | ICD-10-CM | POA: Diagnosis not present

## 2017-12-02 DIAGNOSIS — D631 Anemia in chronic kidney disease: Secondary | ICD-10-CM | POA: Diagnosis not present

## 2017-12-02 DIAGNOSIS — Z992 Dependence on renal dialysis: Secondary | ICD-10-CM | POA: Diagnosis not present

## 2017-12-02 DIAGNOSIS — N2581 Secondary hyperparathyroidism of renal origin: Secondary | ICD-10-CM | POA: Diagnosis not present

## 2017-12-02 DIAGNOSIS — D509 Iron deficiency anemia, unspecified: Secondary | ICD-10-CM | POA: Diagnosis not present

## 2017-12-04 DIAGNOSIS — D509 Iron deficiency anemia, unspecified: Secondary | ICD-10-CM | POA: Diagnosis not present

## 2017-12-04 DIAGNOSIS — Z23 Encounter for immunization: Secondary | ICD-10-CM | POA: Diagnosis not present

## 2017-12-04 DIAGNOSIS — N2581 Secondary hyperparathyroidism of renal origin: Secondary | ICD-10-CM | POA: Diagnosis not present

## 2017-12-04 DIAGNOSIS — Z992 Dependence on renal dialysis: Secondary | ICD-10-CM | POA: Diagnosis not present

## 2017-12-04 DIAGNOSIS — N186 End stage renal disease: Secondary | ICD-10-CM | POA: Diagnosis not present

## 2017-12-04 DIAGNOSIS — D631 Anemia in chronic kidney disease: Secondary | ICD-10-CM | POA: Diagnosis not present

## 2017-12-06 DIAGNOSIS — L97522 Non-pressure chronic ulcer of other part of left foot with fat layer exposed: Secondary | ICD-10-CM | POA: Diagnosis not present

## 2017-12-07 DIAGNOSIS — D509 Iron deficiency anemia, unspecified: Secondary | ICD-10-CM | POA: Diagnosis not present

## 2017-12-07 DIAGNOSIS — Z992 Dependence on renal dialysis: Secondary | ICD-10-CM | POA: Diagnosis not present

## 2017-12-07 DIAGNOSIS — D631 Anemia in chronic kidney disease: Secondary | ICD-10-CM | POA: Diagnosis not present

## 2017-12-07 DIAGNOSIS — Z23 Encounter for immunization: Secondary | ICD-10-CM | POA: Diagnosis not present

## 2017-12-07 DIAGNOSIS — N186 End stage renal disease: Secondary | ICD-10-CM | POA: Diagnosis not present

## 2017-12-07 DIAGNOSIS — N2581 Secondary hyperparathyroidism of renal origin: Secondary | ICD-10-CM | POA: Diagnosis not present

## 2017-12-09 DIAGNOSIS — Z23 Encounter for immunization: Secondary | ICD-10-CM | POA: Diagnosis not present

## 2017-12-09 DIAGNOSIS — D631 Anemia in chronic kidney disease: Secondary | ICD-10-CM | POA: Diagnosis not present

## 2017-12-09 DIAGNOSIS — D509 Iron deficiency anemia, unspecified: Secondary | ICD-10-CM | POA: Diagnosis not present

## 2017-12-09 DIAGNOSIS — Z992 Dependence on renal dialysis: Secondary | ICD-10-CM | POA: Diagnosis not present

## 2017-12-09 DIAGNOSIS — N2581 Secondary hyperparathyroidism of renal origin: Secondary | ICD-10-CM | POA: Diagnosis not present

## 2017-12-09 DIAGNOSIS — N186 End stage renal disease: Secondary | ICD-10-CM | POA: Diagnosis not present

## 2017-12-11 DIAGNOSIS — Z23 Encounter for immunization: Secondary | ICD-10-CM | POA: Diagnosis not present

## 2017-12-11 DIAGNOSIS — N2581 Secondary hyperparathyroidism of renal origin: Secondary | ICD-10-CM | POA: Diagnosis not present

## 2017-12-11 DIAGNOSIS — N186 End stage renal disease: Secondary | ICD-10-CM | POA: Diagnosis not present

## 2017-12-11 DIAGNOSIS — Z992 Dependence on renal dialysis: Secondary | ICD-10-CM | POA: Diagnosis not present

## 2017-12-11 DIAGNOSIS — D631 Anemia in chronic kidney disease: Secondary | ICD-10-CM | POA: Diagnosis not present

## 2017-12-11 DIAGNOSIS — D509 Iron deficiency anemia, unspecified: Secondary | ICD-10-CM | POA: Diagnosis not present

## 2017-12-13 ENCOUNTER — Encounter (INDEPENDENT_AMBULATORY_CARE_PROVIDER_SITE_OTHER): Payer: Self-pay | Admitting: Nurse Practitioner

## 2017-12-13 ENCOUNTER — Ambulatory Visit (INDEPENDENT_AMBULATORY_CARE_PROVIDER_SITE_OTHER): Payer: Medicare Other | Admitting: Nurse Practitioner

## 2017-12-13 ENCOUNTER — Ambulatory Visit (INDEPENDENT_AMBULATORY_CARE_PROVIDER_SITE_OTHER): Payer: Medicare Other

## 2017-12-13 VITALS — BP 146/68 | HR 99 | Resp 12 | Ht 64.5 in | Wt 120.0 lb

## 2017-12-13 DIAGNOSIS — J438 Other emphysema: Secondary | ICD-10-CM

## 2017-12-13 DIAGNOSIS — K219 Gastro-esophageal reflux disease without esophagitis: Secondary | ICD-10-CM

## 2017-12-13 DIAGNOSIS — N186 End stage renal disease: Secondary | ICD-10-CM

## 2017-12-13 DIAGNOSIS — T829XXS Unspecified complication of cardiac and vascular prosthetic device, implant and graft, sequela: Secondary | ICD-10-CM | POA: Diagnosis not present

## 2017-12-14 DIAGNOSIS — D631 Anemia in chronic kidney disease: Secondary | ICD-10-CM | POA: Diagnosis not present

## 2017-12-14 DIAGNOSIS — N186 End stage renal disease: Secondary | ICD-10-CM | POA: Diagnosis not present

## 2017-12-14 DIAGNOSIS — Z992 Dependence on renal dialysis: Secondary | ICD-10-CM | POA: Diagnosis not present

## 2017-12-14 DIAGNOSIS — N2581 Secondary hyperparathyroidism of renal origin: Secondary | ICD-10-CM | POA: Diagnosis not present

## 2017-12-14 DIAGNOSIS — D509 Iron deficiency anemia, unspecified: Secondary | ICD-10-CM | POA: Diagnosis not present

## 2017-12-14 DIAGNOSIS — Z23 Encounter for immunization: Secondary | ICD-10-CM | POA: Diagnosis not present

## 2017-12-15 DIAGNOSIS — N186 End stage renal disease: Secondary | ICD-10-CM | POA: Diagnosis not present

## 2017-12-16 DIAGNOSIS — N2581 Secondary hyperparathyroidism of renal origin: Secondary | ICD-10-CM | POA: Diagnosis not present

## 2017-12-16 DIAGNOSIS — D509 Iron deficiency anemia, unspecified: Secondary | ICD-10-CM | POA: Diagnosis not present

## 2017-12-16 DIAGNOSIS — Z992 Dependence on renal dialysis: Secondary | ICD-10-CM | POA: Diagnosis not present

## 2017-12-16 DIAGNOSIS — Z23 Encounter for immunization: Secondary | ICD-10-CM | POA: Diagnosis not present

## 2017-12-16 DIAGNOSIS — N186 End stage renal disease: Secondary | ICD-10-CM | POA: Diagnosis not present

## 2017-12-16 DIAGNOSIS — D631 Anemia in chronic kidney disease: Secondary | ICD-10-CM | POA: Diagnosis not present
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
## 2017-12-18 DIAGNOSIS — Z23 Encounter for immunization: Secondary | ICD-10-CM | POA: Diagnosis not present

## 2017-12-18 DIAGNOSIS — D509 Iron deficiency anemia, unspecified: Secondary | ICD-10-CM | POA: Diagnosis not present

## 2017-12-18 DIAGNOSIS — Z992 Dependence on renal dialysis: Secondary | ICD-10-CM | POA: Diagnosis not present

## 2017-12-18 DIAGNOSIS — N2581 Secondary hyperparathyroidism of renal origin: Secondary | ICD-10-CM | POA: Diagnosis not present

## 2017-12-18 DIAGNOSIS — N186 End stage renal disease: Secondary | ICD-10-CM | POA: Diagnosis not present

## 2017-12-18 DIAGNOSIS — D631 Anemia in chronic kidney disease: Secondary | ICD-10-CM | POA: Diagnosis not present

## 2017-12-19 ENCOUNTER — Encounter (INDEPENDENT_AMBULATORY_CARE_PROVIDER_SITE_OTHER): Payer: Self-pay | Admitting: Nurse Practitioner

## 2017-12-20 DIAGNOSIS — L97522 Non-pressure chronic ulcer of other part of left foot with fat layer exposed: Secondary | ICD-10-CM | POA: Diagnosis not present

## 2017-12-21 DIAGNOSIS — D631 Anemia in chronic kidney disease: Secondary | ICD-10-CM | POA: Diagnosis not present

## 2017-12-21 DIAGNOSIS — D509 Iron deficiency anemia, unspecified: Secondary | ICD-10-CM | POA: Diagnosis not present

## 2017-12-21 DIAGNOSIS — Z992 Dependence on renal dialysis: Secondary | ICD-10-CM | POA: Diagnosis not present

## 2017-12-21 DIAGNOSIS — N2581 Secondary hyperparathyroidism of renal origin: Secondary | ICD-10-CM | POA: Diagnosis not present

## 2017-12-21 DIAGNOSIS — Z23 Encounter for immunization: Secondary | ICD-10-CM | POA: Diagnosis not present

## 2017-12-21 DIAGNOSIS — N186 End stage renal disease: Secondary | ICD-10-CM | POA: Diagnosis not present

## 2017-12-22 ENCOUNTER — Other Ambulatory Visit (HOSPITAL_COMMUNITY)
Admission: RE | Admit: 2017-12-22 | Discharge: 2017-12-22 | Disposition: A | Discharge status: Home / Self Care | Payer: Medicare Other | Source: Ambulatory Visit | Attending: Certified Nurse Midwife | Admitting: Certified Nurse Midwife

## 2017-12-22 ENCOUNTER — Encounter: Payer: Self-pay | Admitting: Certified Nurse Midwife

## 2017-12-22 ENCOUNTER — Other Ambulatory Visit: Payer: Self-pay

## 2017-12-22 ENCOUNTER — Ambulatory Visit (INDEPENDENT_AMBULATORY_CARE_PROVIDER_SITE_OTHER): Payer: Medicare Other | Admitting: Certified Nurse Midwife

## 2017-12-22 VITALS — BP 130/66 | HR 70 | Resp 16 | Wt 120.0 lb

## 2017-12-22 DIAGNOSIS — Z8709 Personal history of other diseases of the respiratory system: Secondary | ICD-10-CM | POA: Diagnosis not present

## 2017-12-22 DIAGNOSIS — Z87448 Personal history of other diseases of urinary system: Secondary | ICD-10-CM | POA: Diagnosis not present

## 2017-12-22 DIAGNOSIS — Z124 Encounter for screening for malignant neoplasm of cervix: Secondary | ICD-10-CM | POA: Diagnosis not present

## 2017-12-22 DIAGNOSIS — Z01419 Encounter for gynecological examination (general) (routine) without abnormal findings: Secondary | ICD-10-CM

## 2017-12-22 DIAGNOSIS — Z8742 Personal history of other diseases of the female genital tract: Secondary | ICD-10-CM

## 2017-12-22 DIAGNOSIS — Z87898 Personal history of other specified conditions: Secondary | ICD-10-CM

## 2017-12-22 DIAGNOSIS — N951 Menopausal and female climacteric states: Secondary | ICD-10-CM | POA: Diagnosis not present

## 2017-12-22 NOTE — Patient Instructions (Signed)
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
## 2017-12-23 DIAGNOSIS — N186 End stage renal disease: Secondary | ICD-10-CM | POA: Diagnosis not present

## 2017-12-23 DIAGNOSIS — D509 Iron deficiency anemia, unspecified: Secondary | ICD-10-CM | POA: Diagnosis not present

## 2017-12-23 DIAGNOSIS — Z23 Encounter for immunization: Secondary | ICD-10-CM | POA: Diagnosis not present

## 2017-12-23 DIAGNOSIS — D631 Anemia in chronic kidney disease: Secondary | ICD-10-CM | POA: Diagnosis not present

## 2017-12-23 DIAGNOSIS — N2581 Secondary hyperparathyroidism of renal origin: Secondary | ICD-10-CM | POA: Diagnosis not present

## 2017-12-23 DIAGNOSIS — Z992 Dependence on renal dialysis: Secondary | ICD-10-CM | POA: Diagnosis not present

## 2017-12-24 DIAGNOSIS — Z1329 Encounter for screening for other suspected endocrine disorder: Secondary | ICD-10-CM | POA: Diagnosis not present

## 2017-12-24 DIAGNOSIS — I10 Essential (primary) hypertension: Secondary | ICD-10-CM | POA: Diagnosis not present

## 2017-12-24 DIAGNOSIS — J9611 Chronic respiratory failure with hypoxia: Secondary | ICD-10-CM | POA: Diagnosis not present

## 2017-12-24 DIAGNOSIS — N186 End stage renal disease: Secondary | ICD-10-CM | POA: Diagnosis not present

## 2017-12-24 DIAGNOSIS — J449 Chronic obstructive pulmonary disease, unspecified: Secondary | ICD-10-CM | POA: Diagnosis not present

## 2017-12-24 LAB — CYTOLOGY - PAP
Diagnosis: NEGATIVE
HPV (WINDOPATH): NOT DETECTED

## 2017-12-25 DIAGNOSIS — Z992 Dependence on renal dialysis: Secondary | ICD-10-CM | POA: Diagnosis not present

## 2017-12-25 DIAGNOSIS — N186 End stage renal disease: Secondary | ICD-10-CM | POA: Diagnosis not present

## 2017-12-25 DIAGNOSIS — D631 Anemia in chronic kidney disease: Secondary | ICD-10-CM | POA: Diagnosis not present

## 2017-12-25 DIAGNOSIS — N2581 Secondary hyperparathyroidism of renal origin: Secondary | ICD-10-CM | POA: Diagnosis not present

## 2017-12-25 DIAGNOSIS — D509 Iron deficiency anemia, unspecified: Secondary | ICD-10-CM | POA: Diagnosis not present

## 2017-12-25 DIAGNOSIS — Z23 Encounter for immunization: Secondary | ICD-10-CM | POA: Diagnosis not present

## 2017-12-27 DIAGNOSIS — Z08 Encounter for follow-up examination after completed treatment for malignant neoplasm: Secondary | ICD-10-CM | POA: Diagnosis not present

## 2017-12-27 DIAGNOSIS — L570 Actinic keratosis: Secondary | ICD-10-CM | POA: Diagnosis not present

## 2017-12-27 DIAGNOSIS — X32XXXA Exposure to sunlight, initial encounter: Secondary | ICD-10-CM | POA: Diagnosis not present

## 2017-12-27 DIAGNOSIS — D485 Neoplasm of uncertain behavior of skin: Secondary | ICD-10-CM | POA: Diagnosis not present

## 2017-12-27 DIAGNOSIS — L728 Other follicular cysts of the skin and subcutaneous tissue: Secondary | ICD-10-CM | POA: Diagnosis not present

## 2017-12-27 DIAGNOSIS — Z992 Dependence on renal dialysis: Secondary | ICD-10-CM | POA: Diagnosis not present

## 2017-12-27 DIAGNOSIS — L97522 Non-pressure chronic ulcer of other part of left foot with fat layer exposed: Secondary | ICD-10-CM | POA: Diagnosis not present

## 2017-12-27 DIAGNOSIS — C44311 Basal cell carcinoma of skin of nose: Secondary | ICD-10-CM | POA: Diagnosis not present

## 2017-12-27 DIAGNOSIS — Z85828 Personal history of other malignant neoplasm of skin: Secondary | ICD-10-CM | POA: Diagnosis not present

## 2017-12-27 DIAGNOSIS — N186 End stage renal disease: Secondary | ICD-10-CM | POA: Diagnosis not present

## 2017-12-28 DIAGNOSIS — D509 Iron deficiency anemia, unspecified: Secondary | ICD-10-CM | POA: Diagnosis not present

## 2017-12-28 DIAGNOSIS — D631 Anemia in chronic kidney disease: Secondary | ICD-10-CM | POA: Diagnosis not present

## 2017-12-28 DIAGNOSIS — Z992 Dependence on renal dialysis: Secondary | ICD-10-CM | POA: Diagnosis not present

## 2017-12-28 DIAGNOSIS — N2581 Secondary hyperparathyroidism of renal origin: Secondary | ICD-10-CM | POA: Diagnosis not present

## 2017-12-28 DIAGNOSIS — N186 End stage renal disease: Secondary | ICD-10-CM | POA: Diagnosis not present

## 2017-12-30 DIAGNOSIS — N186 End stage renal disease: Secondary | ICD-10-CM | POA: Diagnosis not present

## 2017-12-30 DIAGNOSIS — D631 Anemia in chronic kidney disease: Secondary | ICD-10-CM | POA: Diagnosis not present

## 2017-12-30 DIAGNOSIS — N2581 Secondary hyperparathyroidism of renal origin: Secondary | ICD-10-CM | POA: Diagnosis not present

## 2017-12-30 DIAGNOSIS — Z992 Dependence on renal dialysis: Secondary | ICD-10-CM | POA: Diagnosis not present

## 2017-12-30 DIAGNOSIS — D509 Iron deficiency anemia, unspecified: Secondary | ICD-10-CM | POA: Diagnosis not present

## 2018-01-01 DIAGNOSIS — D631 Anemia in chronic kidney disease: Secondary | ICD-10-CM | POA: Diagnosis not present

## 2018-01-01 DIAGNOSIS — D509 Iron deficiency anemia, unspecified: Secondary | ICD-10-CM | POA: Diagnosis not present

## 2018-01-01 DIAGNOSIS — N186 End stage renal disease: Secondary | ICD-10-CM | POA: Diagnosis not present

## 2018-01-01 DIAGNOSIS — Z992 Dependence on renal dialysis: Secondary | ICD-10-CM | POA: Diagnosis not present

## 2018-01-01 DIAGNOSIS — N2581 Secondary hyperparathyroidism of renal origin: Secondary | ICD-10-CM | POA: Diagnosis not present

## 2018-01-03 DIAGNOSIS — H2513 Age-related nuclear cataract, bilateral: Secondary | ICD-10-CM | POA: Diagnosis not present

## 2018-01-03 DIAGNOSIS — L97522 Non-pressure chronic ulcer of other part of left foot with fat layer exposed: Secondary | ICD-10-CM | POA: Diagnosis not present

## 2018-01-04 DIAGNOSIS — N186 End stage renal disease: Secondary | ICD-10-CM | POA: Diagnosis not present

## 2018-01-04 DIAGNOSIS — D631 Anemia in chronic kidney disease: Secondary | ICD-10-CM | POA: Diagnosis not present

## 2018-01-04 DIAGNOSIS — N2581 Secondary hyperparathyroidism of renal origin: Secondary | ICD-10-CM | POA: Diagnosis not present

## 2018-01-04 DIAGNOSIS — Z992 Dependence on renal dialysis: Secondary | ICD-10-CM | POA: Diagnosis not present

## 2018-01-04 DIAGNOSIS — D509 Iron deficiency anemia, unspecified: Secondary | ICD-10-CM | POA: Diagnosis not present

## 2018-01-06 DIAGNOSIS — Z992 Dependence on renal dialysis: Secondary | ICD-10-CM | POA: Diagnosis not present

## 2018-01-06 DIAGNOSIS — N2581 Secondary hyperparathyroidism of renal origin: Secondary | ICD-10-CM | POA: Diagnosis not present

## 2018-01-06 DIAGNOSIS — N186 End stage renal disease: Secondary | ICD-10-CM | POA: Diagnosis not present

## 2018-01-06 DIAGNOSIS — D509 Iron deficiency anemia, unspecified: Secondary | ICD-10-CM | POA: Diagnosis not present

## 2018-01-06 DIAGNOSIS — D631 Anemia in chronic kidney disease: Secondary | ICD-10-CM | POA: Diagnosis not present

## 2018-01-08 DIAGNOSIS — D631 Anemia in chronic kidney disease: Secondary | ICD-10-CM | POA: Diagnosis not present

## 2018-01-08 DIAGNOSIS — N186 End stage renal disease: Secondary | ICD-10-CM | POA: Diagnosis not present

## 2018-01-08 DIAGNOSIS — N2581 Secondary hyperparathyroidism of renal origin: Secondary | ICD-10-CM | POA: Diagnosis not present

## 2018-01-08 DIAGNOSIS — D509 Iron deficiency anemia, unspecified: Secondary | ICD-10-CM | POA: Diagnosis not present

## 2018-01-08 DIAGNOSIS — Z992 Dependence on renal dialysis: Secondary | ICD-10-CM | POA: Diagnosis not present

## 2018-01-10 DIAGNOSIS — L97522 Non-pressure chronic ulcer of other part of left foot with fat layer exposed: Secondary | ICD-10-CM | POA: Diagnosis not present

## 2018-01-11 DIAGNOSIS — N186 End stage renal disease: Secondary | ICD-10-CM | POA: Diagnosis not present

## 2018-01-11 DIAGNOSIS — Z992 Dependence on renal dialysis: Secondary | ICD-10-CM | POA: Diagnosis not present

## 2018-01-11 DIAGNOSIS — N2581 Secondary hyperparathyroidism of renal origin: Secondary | ICD-10-CM | POA: Diagnosis not present

## 2018-01-11 DIAGNOSIS — D631 Anemia in chronic kidney disease: Secondary | ICD-10-CM | POA: Diagnosis not present

## 2018-01-11 DIAGNOSIS — D509 Iron deficiency anemia, unspecified: Secondary | ICD-10-CM | POA: Diagnosis not present

## 2018-01-13 DIAGNOSIS — Z992 Dependence on renal dialysis: Secondary | ICD-10-CM | POA: Diagnosis not present

## 2018-01-13 DIAGNOSIS — N186 End stage renal disease: Secondary | ICD-10-CM | POA: Diagnosis not present

## 2018-01-13 DIAGNOSIS — D509 Iron deficiency anemia, unspecified: Secondary | ICD-10-CM | POA: Diagnosis not present

## 2018-01-13 DIAGNOSIS — N2581 Secondary hyperparathyroidism of renal origin: Secondary | ICD-10-CM | POA: Diagnosis not present

## 2018-01-13 DIAGNOSIS — D631 Anemia in chronic kidney disease: Secondary | ICD-10-CM | POA: Diagnosis not present

## 2018-01-15 DIAGNOSIS — N186 End stage renal disease: Secondary | ICD-10-CM | POA: Diagnosis not present

## 2018-01-15 DIAGNOSIS — D509 Iron deficiency anemia, unspecified: Secondary | ICD-10-CM | POA: Diagnosis not present

## 2018-01-15 DIAGNOSIS — Z992 Dependence on renal dialysis: Secondary | ICD-10-CM | POA: Diagnosis not present

## 2018-01-15 DIAGNOSIS — D631 Anemia in chronic kidney disease: Secondary | ICD-10-CM | POA: Diagnosis not present

## 2018-01-15 DIAGNOSIS — N2581 Secondary hyperparathyroidism of renal origin: Secondary | ICD-10-CM | POA: Diagnosis not present

## 2018-01-17 DIAGNOSIS — L97522 Non-pressure chronic ulcer of other part of left foot with fat layer exposed: Secondary | ICD-10-CM | POA: Diagnosis not present

## 2018-01-18 DIAGNOSIS — Z992 Dependence on renal dialysis: Secondary | ICD-10-CM | POA: Diagnosis not present

## 2018-01-18 DIAGNOSIS — D509 Iron deficiency anemia, unspecified: Secondary | ICD-10-CM | POA: Diagnosis not present

## 2018-01-18 DIAGNOSIS — N186 End stage renal disease: Secondary | ICD-10-CM | POA: Diagnosis not present

## 2018-01-18 DIAGNOSIS — D631 Anemia in chronic kidney disease: Secondary | ICD-10-CM | POA: Diagnosis not present

## 2018-01-18 DIAGNOSIS — N2581 Secondary hyperparathyroidism of renal origin: Secondary | ICD-10-CM | POA: Diagnosis not present

## 2018-01-20 DIAGNOSIS — Z992 Dependence on renal dialysis: Secondary | ICD-10-CM | POA: Diagnosis not present

## 2018-01-20 DIAGNOSIS — N2581 Secondary hyperparathyroidism of renal origin: Secondary | ICD-10-CM | POA: Diagnosis not present

## 2018-01-20 DIAGNOSIS — D631 Anemia in chronic kidney disease: Secondary | ICD-10-CM | POA: Diagnosis not present

## 2018-01-20 DIAGNOSIS — N186 End stage renal disease: Secondary | ICD-10-CM | POA: Diagnosis not present

## 2018-01-20 DIAGNOSIS — D509 Iron deficiency anemia, unspecified: Secondary | ICD-10-CM | POA: Diagnosis not present

## 2018-01-22 DIAGNOSIS — N186 End stage renal disease: Secondary | ICD-10-CM | POA: Diagnosis not present

## 2018-01-22 DIAGNOSIS — Z992 Dependence on renal dialysis: Secondary | ICD-10-CM | POA: Diagnosis not present

## 2018-01-22 DIAGNOSIS — N2581 Secondary hyperparathyroidism of renal origin: Secondary | ICD-10-CM | POA: Diagnosis not present

## 2018-01-22 DIAGNOSIS — D631 Anemia in chronic kidney disease: Secondary | ICD-10-CM | POA: Diagnosis not present

## 2018-01-22 DIAGNOSIS — D509 Iron deficiency anemia, unspecified: Secondary | ICD-10-CM | POA: Diagnosis not present

## 2018-01-24 DIAGNOSIS — L97522 Non-pressure chronic ulcer of other part of left foot with fat layer exposed: Secondary | ICD-10-CM | POA: Diagnosis not present

## 2018-01-25 DIAGNOSIS — N186 End stage renal disease: Secondary | ICD-10-CM | POA: Diagnosis not present

## 2018-01-25 DIAGNOSIS — D509 Iron deficiency anemia, unspecified: Secondary | ICD-10-CM | POA: Diagnosis not present

## 2018-01-25 DIAGNOSIS — Z992 Dependence on renal dialysis: Secondary | ICD-10-CM | POA: Diagnosis not present

## 2018-01-25 DIAGNOSIS — D631 Anemia in chronic kidney disease: Secondary | ICD-10-CM | POA: Diagnosis not present

## 2018-01-25 DIAGNOSIS — N2581 Secondary hyperparathyroidism of renal origin: Secondary | ICD-10-CM | POA: Diagnosis not present

## 2018-01-27 DIAGNOSIS — D631 Anemia in chronic kidney disease: Secondary | ICD-10-CM | POA: Diagnosis not present

## 2018-01-27 DIAGNOSIS — N2581 Secondary hyperparathyroidism of renal origin: Secondary | ICD-10-CM | POA: Diagnosis not present

## 2018-01-27 DIAGNOSIS — Z992 Dependence on renal dialysis: Secondary | ICD-10-CM | POA: Diagnosis not present

## 2018-01-27 DIAGNOSIS — D509 Iron deficiency anemia, unspecified: Secondary | ICD-10-CM | POA: Diagnosis not present

## 2018-01-27 DIAGNOSIS — N186 End stage renal disease: Secondary | ICD-10-CM | POA: Diagnosis not present

## 2018-01-29 DIAGNOSIS — D509 Iron deficiency anemia, unspecified: Secondary | ICD-10-CM | POA: Diagnosis not present

## 2018-01-29 DIAGNOSIS — Z992 Dependence on renal dialysis: Secondary | ICD-10-CM | POA: Diagnosis not present

## 2018-01-29 DIAGNOSIS — N186 End stage renal disease: Secondary | ICD-10-CM | POA: Diagnosis not present

## 2018-01-29 DIAGNOSIS — N2581 Secondary hyperparathyroidism of renal origin: Secondary | ICD-10-CM | POA: Diagnosis not present

## 2018-01-29 DIAGNOSIS — D631 Anemia in chronic kidney disease: Secondary | ICD-10-CM | POA: Diagnosis not present

## 2018-01-31 DIAGNOSIS — L97522 Non-pressure chronic ulcer of other part of left foot with fat layer exposed: Secondary | ICD-10-CM | POA: Diagnosis not present

## 2018-02-01 DIAGNOSIS — N2581 Secondary hyperparathyroidism of renal origin: Secondary | ICD-10-CM | POA: Diagnosis not present

## 2018-02-01 DIAGNOSIS — Z992 Dependence on renal dialysis: Secondary | ICD-10-CM | POA: Diagnosis not present

## 2018-02-01 DIAGNOSIS — N186 End stage renal disease: Secondary | ICD-10-CM | POA: Diagnosis not present

## 2018-02-01 DIAGNOSIS — D509 Iron deficiency anemia, unspecified: Secondary | ICD-10-CM | POA: Diagnosis not present

## 2018-02-01 DIAGNOSIS — D631 Anemia in chronic kidney disease: Secondary | ICD-10-CM | POA: Diagnosis not present

## 2018-02-03 DIAGNOSIS — N186 End stage renal disease: Secondary | ICD-10-CM | POA: Diagnosis not present

## 2018-02-03 DIAGNOSIS — D509 Iron deficiency anemia, unspecified: Secondary | ICD-10-CM | POA: Diagnosis not present

## 2018-02-03 DIAGNOSIS — D631 Anemia in chronic kidney disease: Secondary | ICD-10-CM | POA: Diagnosis not present

## 2018-02-03 DIAGNOSIS — N2581 Secondary hyperparathyroidism of renal origin: Secondary | ICD-10-CM | POA: Diagnosis not present

## 2018-02-03 DIAGNOSIS — Z992 Dependence on renal dialysis: Secondary | ICD-10-CM | POA: Diagnosis not present

## 2018-02-05 DIAGNOSIS — N2581 Secondary hyperparathyroidism of renal origin: Secondary | ICD-10-CM | POA: Diagnosis not present

## 2018-02-05 DIAGNOSIS — N186 End stage renal disease: Secondary | ICD-10-CM | POA: Diagnosis not present

## 2018-02-05 DIAGNOSIS — D631 Anemia in chronic kidney disease: Secondary | ICD-10-CM | POA: Diagnosis not present

## 2018-02-05 DIAGNOSIS — Z992 Dependence on renal dialysis: Secondary | ICD-10-CM | POA: Diagnosis not present

## 2018-02-05 DIAGNOSIS — D509 Iron deficiency anemia, unspecified: Secondary | ICD-10-CM | POA: Diagnosis not present

## 2018-02-07 DIAGNOSIS — S9032XD Contusion of left foot, subsequent encounter: Secondary | ICD-10-CM | POA: Diagnosis not present

## 2018-02-08 DIAGNOSIS — D631 Anemia in chronic kidney disease: Secondary | ICD-10-CM | POA: Diagnosis not present

## 2018-02-08 DIAGNOSIS — Z992 Dependence on renal dialysis: Secondary | ICD-10-CM | POA: Diagnosis not present

## 2018-02-08 DIAGNOSIS — N186 End stage renal disease: Secondary | ICD-10-CM | POA: Diagnosis not present

## 2018-02-08 DIAGNOSIS — N2581 Secondary hyperparathyroidism of renal origin: Secondary | ICD-10-CM | POA: Diagnosis not present

## 2018-02-08 DIAGNOSIS — D509 Iron deficiency anemia, unspecified: Secondary | ICD-10-CM | POA: Diagnosis not present

## 2018-02-10 DIAGNOSIS — D509 Iron deficiency anemia, unspecified: Secondary | ICD-10-CM | POA: Diagnosis not present

## 2018-02-10 DIAGNOSIS — Z992 Dependence on renal dialysis: Secondary | ICD-10-CM | POA: Diagnosis not present

## 2018-02-10 DIAGNOSIS — N186 End stage renal disease: Secondary | ICD-10-CM | POA: Diagnosis not present

## 2018-02-10 DIAGNOSIS — D631 Anemia in chronic kidney disease: Secondary | ICD-10-CM | POA: Diagnosis not present

## 2018-02-10 DIAGNOSIS — N2581 Secondary hyperparathyroidism of renal origin: Secondary | ICD-10-CM | POA: Diagnosis not present

## 2018-02-12 DIAGNOSIS — D509 Iron deficiency anemia, unspecified: Secondary | ICD-10-CM | POA: Diagnosis not present

## 2018-02-12 DIAGNOSIS — N2581 Secondary hyperparathyroidism of renal origin: Secondary | ICD-10-CM | POA: Diagnosis not present

## 2018-02-12 DIAGNOSIS — D631 Anemia in chronic kidney disease: Secondary | ICD-10-CM | POA: Diagnosis not present

## 2018-02-12 DIAGNOSIS — N186 End stage renal disease: Secondary | ICD-10-CM | POA: Diagnosis not present

## 2018-02-12 DIAGNOSIS — Z992 Dependence on renal dialysis: Secondary | ICD-10-CM | POA: Diagnosis not present

## 2018-02-14 DIAGNOSIS — L97522 Non-pressure chronic ulcer of other part of left foot with fat layer exposed: Secondary | ICD-10-CM | POA: Diagnosis not present

## 2018-02-15 DIAGNOSIS — Z992 Dependence on renal dialysis: Secondary | ICD-10-CM | POA: Diagnosis not present

## 2018-02-15 DIAGNOSIS — N186 End stage renal disease: Secondary | ICD-10-CM | POA: Diagnosis not present

## 2018-02-15 DIAGNOSIS — D631 Anemia in chronic kidney disease: Secondary | ICD-10-CM | POA: Diagnosis not present

## 2018-02-15 DIAGNOSIS — N2581 Secondary hyperparathyroidism of renal origin: Secondary | ICD-10-CM | POA: Diagnosis not present

## 2018-02-15 DIAGNOSIS — D509 Iron deficiency anemia, unspecified: Secondary | ICD-10-CM | POA: Diagnosis not present

## 2018-02-17 DIAGNOSIS — D509 Iron deficiency anemia, unspecified: Secondary | ICD-10-CM | POA: Diagnosis not present

## 2018-02-17 DIAGNOSIS — D631 Anemia in chronic kidney disease: Secondary | ICD-10-CM | POA: Diagnosis not present

## 2018-02-17 DIAGNOSIS — Z992 Dependence on renal dialysis: Secondary | ICD-10-CM | POA: Diagnosis not present

## 2018-02-17 DIAGNOSIS — N186 End stage renal disease: Secondary | ICD-10-CM | POA: Diagnosis not present

## 2018-02-17 DIAGNOSIS — N2581 Secondary hyperparathyroidism of renal origin: Secondary | ICD-10-CM | POA: Diagnosis not present

## 2018-02-19 DIAGNOSIS — D509 Iron deficiency anemia, unspecified: Secondary | ICD-10-CM | POA: Diagnosis not present

## 2018-02-19 DIAGNOSIS — N2581 Secondary hyperparathyroidism of renal origin: Secondary | ICD-10-CM | POA: Diagnosis not present

## 2018-02-19 DIAGNOSIS — D631 Anemia in chronic kidney disease: Secondary | ICD-10-CM | POA: Diagnosis not present

## 2018-02-19 DIAGNOSIS — N186 End stage renal disease: Secondary | ICD-10-CM | POA: Diagnosis not present

## 2018-02-19 DIAGNOSIS — Z992 Dependence on renal dialysis: Secondary | ICD-10-CM | POA: Diagnosis not present

## 2018-02-21 DIAGNOSIS — Z992 Dependence on renal dialysis: Secondary | ICD-10-CM | POA: Diagnosis not present

## 2018-02-21 DIAGNOSIS — D509 Iron deficiency anemia, unspecified: Secondary | ICD-10-CM | POA: Diagnosis not present

## 2018-02-21 DIAGNOSIS — N186 End stage renal disease: Secondary | ICD-10-CM | POA: Diagnosis not present

## 2018-02-21 DIAGNOSIS — L97522 Non-pressure chronic ulcer of other part of left foot with fat layer exposed: Secondary | ICD-10-CM | POA: Diagnosis not present

## 2018-02-21 DIAGNOSIS — N2581 Secondary hyperparathyroidism of renal origin: Secondary | ICD-10-CM | POA: Diagnosis not present

## 2018-02-21 DIAGNOSIS — D631 Anemia in chronic kidney disease: Secondary | ICD-10-CM | POA: Diagnosis not present

## 2018-02-23 DIAGNOSIS — D509 Iron deficiency anemia, unspecified: Secondary | ICD-10-CM | POA: Diagnosis not present

## 2018-02-23 DIAGNOSIS — N186 End stage renal disease: Secondary | ICD-10-CM | POA: Diagnosis not present

## 2018-02-23 DIAGNOSIS — D631 Anemia in chronic kidney disease: Secondary | ICD-10-CM | POA: Diagnosis not present

## 2018-02-23 DIAGNOSIS — Z992 Dependence on renal dialysis: Secondary | ICD-10-CM | POA: Diagnosis not present

## 2018-02-23 DIAGNOSIS — N2581 Secondary hyperparathyroidism of renal origin: Secondary | ICD-10-CM | POA: Diagnosis not present

## 2018-02-26 DIAGNOSIS — N186 End stage renal disease: Secondary | ICD-10-CM | POA: Diagnosis not present

## 2018-02-26 DIAGNOSIS — N2581 Secondary hyperparathyroidism of renal origin: Secondary | ICD-10-CM | POA: Diagnosis not present

## 2018-02-26 DIAGNOSIS — D509 Iron deficiency anemia, unspecified: Secondary | ICD-10-CM | POA: Diagnosis not present

## 2018-02-26 DIAGNOSIS — D631 Anemia in chronic kidney disease: Secondary | ICD-10-CM | POA: Diagnosis not present

## 2018-02-26 DIAGNOSIS — Z992 Dependence on renal dialysis: Secondary | ICD-10-CM | POA: Diagnosis not present

## 2018-02-28 DIAGNOSIS — L97522 Non-pressure chronic ulcer of other part of left foot with fat layer exposed: Secondary | ICD-10-CM | POA: Diagnosis not present

## 2018-03-01 DIAGNOSIS — D509 Iron deficiency anemia, unspecified: Secondary | ICD-10-CM | POA: Diagnosis not present

## 2018-03-01 DIAGNOSIS — Z992 Dependence on renal dialysis: Secondary | ICD-10-CM | POA: Diagnosis not present

## 2018-03-01 DIAGNOSIS — N2581 Secondary hyperparathyroidism of renal origin: Secondary | ICD-10-CM | POA: Diagnosis not present

## 2018-03-01 DIAGNOSIS — D631 Anemia in chronic kidney disease: Secondary | ICD-10-CM | POA: Diagnosis not present

## 2018-03-01 DIAGNOSIS — N186 End stage renal disease: Secondary | ICD-10-CM | POA: Diagnosis not present

## 2018-03-02 DIAGNOSIS — L578 Other skin changes due to chronic exposure to nonionizing radiation: Secondary | ICD-10-CM | POA: Diagnosis not present

## 2018-03-02 DIAGNOSIS — C44311 Basal cell carcinoma of skin of nose: Secondary | ICD-10-CM | POA: Diagnosis not present

## 2018-03-02 DIAGNOSIS — L814 Other melanin hyperpigmentation: Secondary | ICD-10-CM | POA: Diagnosis not present

## 2018-03-02 DIAGNOSIS — L988 Other specified disorders of the skin and subcutaneous tissue: Secondary | ICD-10-CM | POA: Diagnosis not present

## 2018-03-03 DIAGNOSIS — D509 Iron deficiency anemia, unspecified: Secondary | ICD-10-CM | POA: Diagnosis not present

## 2018-03-03 DIAGNOSIS — Z992 Dependence on renal dialysis: Secondary | ICD-10-CM | POA: Diagnosis not present

## 2018-03-03 DIAGNOSIS — D631 Anemia in chronic kidney disease: Secondary | ICD-10-CM | POA: Diagnosis not present

## 2018-03-03 DIAGNOSIS — N2581 Secondary hyperparathyroidism of renal origin: Secondary | ICD-10-CM | POA: Diagnosis not present

## 2018-03-03 DIAGNOSIS — N186 End stage renal disease: Secondary | ICD-10-CM | POA: Diagnosis not present

## 2018-03-05 DIAGNOSIS — D631 Anemia in chronic kidney disease: Secondary | ICD-10-CM | POA: Diagnosis not present

## 2018-03-05 DIAGNOSIS — Z992 Dependence on renal dialysis: Secondary | ICD-10-CM | POA: Diagnosis not present

## 2018-03-05 DIAGNOSIS — N2581 Secondary hyperparathyroidism of renal origin: Secondary | ICD-10-CM | POA: Diagnosis not present

## 2018-03-05 DIAGNOSIS — D509 Iron deficiency anemia, unspecified: Secondary | ICD-10-CM | POA: Diagnosis not present

## 2018-03-05 DIAGNOSIS — N186 End stage renal disease: Secondary | ICD-10-CM | POA: Diagnosis not present

## 2018-03-07 ENCOUNTER — Ambulatory Visit (INDEPENDENT_AMBULATORY_CARE_PROVIDER_SITE_OTHER): Payer: Medicare Other | Admitting: Vascular Surgery

## 2018-03-07 ENCOUNTER — Other Ambulatory Visit (INDEPENDENT_AMBULATORY_CARE_PROVIDER_SITE_OTHER): Payer: Self-pay | Admitting: Vascular Surgery

## 2018-03-07 ENCOUNTER — Encounter (INDEPENDENT_AMBULATORY_CARE_PROVIDER_SITE_OTHER): Payer: Self-pay | Admitting: Vascular Surgery

## 2018-03-07 ENCOUNTER — Ambulatory Visit (INDEPENDENT_AMBULATORY_CARE_PROVIDER_SITE_OTHER): Payer: Medicare Other

## 2018-03-07 ENCOUNTER — Encounter (INDEPENDENT_AMBULATORY_CARE_PROVIDER_SITE_OTHER): Payer: Medicare Other

## 2018-03-07 VITALS — BP 143/70 | HR 85 | Ht 64.0 in | Wt 117.0 lb

## 2018-03-07 DIAGNOSIS — Z87891 Personal history of nicotine dependence: Secondary | ICD-10-CM

## 2018-03-07 DIAGNOSIS — I713 Abdominal aortic aneurysm, ruptured, unspecified: Secondary | ICD-10-CM

## 2018-03-07 DIAGNOSIS — L97522 Non-pressure chronic ulcer of other part of left foot with fat layer exposed: Secondary | ICD-10-CM | POA: Diagnosis not present

## 2018-03-07 DIAGNOSIS — L97521 Non-pressure chronic ulcer of other part of left foot limited to breakdown of skin: Secondary | ICD-10-CM

## 2018-03-07 DIAGNOSIS — K219 Gastro-esophageal reflux disease without esophagitis: Secondary | ICD-10-CM

## 2018-03-07 DIAGNOSIS — T829XXS Unspecified complication of cardiac and vascular prosthetic device, implant and graft, sequela: Secondary | ICD-10-CM

## 2018-03-07 DIAGNOSIS — T82868D Thrombosis of vascular prosthetic devices, implants and grafts, subsequent encounter: Secondary | ICD-10-CM

## 2018-03-07 DIAGNOSIS — N186 End stage renal disease: Secondary | ICD-10-CM

## 2018-03-07 DIAGNOSIS — Z992 Dependence on renal dialysis: Secondary | ICD-10-CM | POA: Diagnosis not present

## 2018-03-07 DIAGNOSIS — J441 Chronic obstructive pulmonary disease with (acute) exacerbation: Secondary | ICD-10-CM

## 2018-03-07 MED ORDER — "AQUACEL AG BURN 4""X5"" EX PADS": 1.0000 | MEDICATED_PAD | Freq: Every day | CUTANEOUS | 4 refills | Status: DC
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
## 2018-03-08 ENCOUNTER — Encounter (INDEPENDENT_AMBULATORY_CARE_PROVIDER_SITE_OTHER): Payer: Self-pay | Admitting: Vascular Surgery

## 2018-03-08 DIAGNOSIS — D509 Iron deficiency anemia, unspecified: Secondary | ICD-10-CM | POA: Diagnosis not present

## 2018-03-08 DIAGNOSIS — D631 Anemia in chronic kidney disease: Secondary | ICD-10-CM | POA: Diagnosis not present

## 2018-03-08 DIAGNOSIS — Z992 Dependence on renal dialysis: Secondary | ICD-10-CM | POA: Diagnosis not present

## 2018-03-08 DIAGNOSIS — L97522 Non-pressure chronic ulcer of other part of left foot with fat layer exposed: Secondary | ICD-10-CM | POA: Insufficient documentation

## 2018-03-08 DIAGNOSIS — N186 End stage renal disease: Secondary | ICD-10-CM | POA: Diagnosis not present

## 2018-03-08 DIAGNOSIS — N2581 Secondary hyperparathyroidism of renal origin: Secondary | ICD-10-CM | POA: Diagnosis not present

## 2018-03-10 DIAGNOSIS — D509 Iron deficiency anemia, unspecified: Secondary | ICD-10-CM | POA: Diagnosis not present

## 2018-03-10 DIAGNOSIS — Z992 Dependence on renal dialysis: Secondary | ICD-10-CM | POA: Diagnosis not present

## 2018-03-10 DIAGNOSIS — N2581 Secondary hyperparathyroidism of renal origin: Secondary | ICD-10-CM | POA: Diagnosis not present

## 2018-03-10 DIAGNOSIS — D631 Anemia in chronic kidney disease: Secondary | ICD-10-CM | POA: Diagnosis not present

## 2018-03-10 DIAGNOSIS — N186 End stage renal disease: Secondary | ICD-10-CM | POA: Diagnosis not present

## 2018-03-12 DIAGNOSIS — D631 Anemia in chronic kidney disease: Secondary | ICD-10-CM | POA: Diagnosis not present

## 2018-03-12 DIAGNOSIS — N2581 Secondary hyperparathyroidism of renal origin: Secondary | ICD-10-CM | POA: Diagnosis not present

## 2018-03-12 DIAGNOSIS — Z992 Dependence on renal dialysis: Secondary | ICD-10-CM | POA: Diagnosis not present

## 2018-03-12 DIAGNOSIS — N186 End stage renal disease: Secondary | ICD-10-CM | POA: Diagnosis not present

## 2018-03-12 DIAGNOSIS — D509 Iron deficiency anemia, unspecified: Secondary | ICD-10-CM | POA: Diagnosis not present

## 2018-03-14 ENCOUNTER — Encounter (INDEPENDENT_AMBULATORY_CARE_PROVIDER_SITE_OTHER): Payer: Medicare Other

## 2018-03-14 ENCOUNTER — Ambulatory Visit (INDEPENDENT_AMBULATORY_CARE_PROVIDER_SITE_OTHER): Payer: Medicare Other | Admitting: Vascular Surgery

## 2018-03-15 DIAGNOSIS — D631 Anemia in chronic kidney disease: Secondary | ICD-10-CM | POA: Diagnosis not present

## 2018-03-15 DIAGNOSIS — D509 Iron deficiency anemia, unspecified: Secondary | ICD-10-CM | POA: Diagnosis not present

## 2018-03-15 DIAGNOSIS — N2581 Secondary hyperparathyroidism of renal origin: Secondary | ICD-10-CM | POA: Diagnosis not present

## 2018-03-15 DIAGNOSIS — Z992 Dependence on renal dialysis: Secondary | ICD-10-CM | POA: Diagnosis not present

## 2018-03-15 DIAGNOSIS — N186 End stage renal disease: Secondary | ICD-10-CM | POA: Diagnosis not present

## 2018-03-17 ENCOUNTER — Other Ambulatory Visit: Payer: Self-pay | Admitting: Family Medicine

## 2018-03-17 DIAGNOSIS — D631 Anemia in chronic kidney disease: Secondary | ICD-10-CM | POA: Diagnosis not present

## 2018-03-17 DIAGNOSIS — D509 Iron deficiency anemia, unspecified: Secondary | ICD-10-CM | POA: Diagnosis not present

## 2018-03-17 DIAGNOSIS — N186 End stage renal disease: Secondary | ICD-10-CM | POA: Diagnosis not present

## 2018-03-17 DIAGNOSIS — Z992 Dependence on renal dialysis: Secondary | ICD-10-CM | POA: Diagnosis not present

## 2018-03-17 DIAGNOSIS — N2581 Secondary hyperparathyroidism of renal origin: Secondary | ICD-10-CM | POA: Diagnosis not present

## 2018-03-19 DIAGNOSIS — Z992 Dependence on renal dialysis: Secondary | ICD-10-CM | POA: Diagnosis not present

## 2018-03-19 DIAGNOSIS — D509 Iron deficiency anemia, unspecified: Secondary | ICD-10-CM | POA: Diagnosis not present

## 2018-03-19 DIAGNOSIS — D631 Anemia in chronic kidney disease: Secondary | ICD-10-CM | POA: Diagnosis not present

## 2018-03-19 DIAGNOSIS — N186 End stage renal disease: Secondary | ICD-10-CM | POA: Diagnosis not present

## 2018-03-19 DIAGNOSIS — N2581 Secondary hyperparathyroidism of renal origin: Secondary | ICD-10-CM | POA: Diagnosis not present

## 2018-03-21 DIAGNOSIS — N2581 Secondary hyperparathyroidism of renal origin: Secondary | ICD-10-CM | POA: Diagnosis not present

## 2018-03-21 DIAGNOSIS — N186 End stage renal disease: Secondary | ICD-10-CM | POA: Diagnosis not present

## 2018-03-21 DIAGNOSIS — Z1329 Encounter for screening for other suspected endocrine disorder: Secondary | ICD-10-CM | POA: Diagnosis not present

## 2018-03-21 DIAGNOSIS — D509 Iron deficiency anemia, unspecified: Secondary | ICD-10-CM | POA: Diagnosis not present

## 2018-03-21 DIAGNOSIS — Z992 Dependence on renal dialysis: Secondary | ICD-10-CM | POA: Diagnosis not present

## 2018-03-21 DIAGNOSIS — D631 Anemia in chronic kidney disease: Secondary | ICD-10-CM | POA: Diagnosis not present

## 2018-03-21 DIAGNOSIS — I10 Essential (primary) hypertension: Secondary | ICD-10-CM | POA: Diagnosis not present

## 2018-03-24 DIAGNOSIS — D509 Iron deficiency anemia, unspecified: Secondary | ICD-10-CM | POA: Diagnosis not present

## 2018-03-24 DIAGNOSIS — N2581 Secondary hyperparathyroidism of renal origin: Secondary | ICD-10-CM | POA: Diagnosis not present

## 2018-03-24 DIAGNOSIS — Z992 Dependence on renal dialysis: Secondary | ICD-10-CM | POA: Diagnosis not present

## 2018-03-24 DIAGNOSIS — N186 End stage renal disease: Secondary | ICD-10-CM | POA: Diagnosis not present

## 2018-03-24 DIAGNOSIS — D631 Anemia in chronic kidney disease: Secondary | ICD-10-CM | POA: Diagnosis not present

## 2018-03-26 DIAGNOSIS — D631 Anemia in chronic kidney disease: Secondary | ICD-10-CM | POA: Diagnosis not present

## 2018-03-26 DIAGNOSIS — D509 Iron deficiency anemia, unspecified: Secondary | ICD-10-CM | POA: Diagnosis not present

## 2018-03-26 DIAGNOSIS — N186 End stage renal disease: Secondary | ICD-10-CM | POA: Diagnosis not present

## 2018-03-26 DIAGNOSIS — Z992 Dependence on renal dialysis: Secondary | ICD-10-CM | POA: Diagnosis not present

## 2018-03-26 DIAGNOSIS — N2581 Secondary hyperparathyroidism of renal origin: Secondary | ICD-10-CM | POA: Diagnosis not present

## 2018-03-28 DIAGNOSIS — I10 Essential (primary) hypertension: Secondary | ICD-10-CM | POA: Diagnosis not present

## 2018-03-28 DIAGNOSIS — E038 Other specified hypothyroidism: Secondary | ICD-10-CM | POA: Diagnosis not present

## 2018-03-28 DIAGNOSIS — N186 End stage renal disease: Secondary | ICD-10-CM | POA: Diagnosis not present

## 2018-03-28 DIAGNOSIS — J9611 Chronic respiratory failure with hypoxia: Secondary | ICD-10-CM | POA: Diagnosis not present

## 2018-03-28 DIAGNOSIS — J449 Chronic obstructive pulmonary disease, unspecified: Secondary | ICD-10-CM | POA: Diagnosis not present

## 2018-03-29 DIAGNOSIS — N2581 Secondary hyperparathyroidism of renal origin: Secondary | ICD-10-CM | POA: Diagnosis not present

## 2018-03-29 DIAGNOSIS — Z992 Dependence on renal dialysis: Secondary | ICD-10-CM | POA: Diagnosis not present

## 2018-03-29 DIAGNOSIS — D631 Anemia in chronic kidney disease: Secondary | ICD-10-CM | POA: Diagnosis not present

## 2018-03-29 DIAGNOSIS — D509 Iron deficiency anemia, unspecified: Secondary | ICD-10-CM | POA: Diagnosis not present

## 2018-03-29 DIAGNOSIS — N186 End stage renal disease: Secondary | ICD-10-CM | POA: Diagnosis not present

## 2018-04-11 ENCOUNTER — Encounter (INDEPENDENT_AMBULATORY_CARE_PROVIDER_SITE_OTHER): Payer: Self-pay | Admitting: Vascular Surgery

## 2018-04-11 ENCOUNTER — Ambulatory Visit (INDEPENDENT_AMBULATORY_CARE_PROVIDER_SITE_OTHER): Payer: Medicare Other | Admitting: Vascular Surgery

## 2018-04-11 VITALS — BP 138/61 | HR 89 | Resp 16

## 2018-04-11 DIAGNOSIS — I713 Abdominal aortic aneurysm, ruptured, unspecified: Secondary | ICD-10-CM

## 2018-04-11 DIAGNOSIS — I70213 Atherosclerosis of native arteries of extremities with intermittent claudication, bilateral legs: Secondary | ICD-10-CM

## 2018-04-11 DIAGNOSIS — L97522 Non-pressure chronic ulcer of other part of left foot with fat layer exposed: Secondary | ICD-10-CM | POA: Diagnosis not present

## 2018-04-11 DIAGNOSIS — Z87891 Personal history of nicotine dependence: Secondary | ICD-10-CM

## 2018-04-11 DIAGNOSIS — I739 Peripheral vascular disease, unspecified: Secondary | ICD-10-CM

## 2018-04-11 DIAGNOSIS — N186 End stage renal disease: Secondary | ICD-10-CM

## 2018-04-15 ENCOUNTER — Encounter (INDEPENDENT_AMBULATORY_CARE_PROVIDER_SITE_OTHER): Payer: Self-pay | Admitting: Vascular Surgery
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
## 2018-06-13 ENCOUNTER — Ambulatory Visit (INDEPENDENT_AMBULATORY_CARE_PROVIDER_SITE_OTHER): Payer: Medicare Other | Admitting: Vascular Surgery

## 2018-06-13 ENCOUNTER — Encounter (INDEPENDENT_AMBULATORY_CARE_PROVIDER_SITE_OTHER): Payer: Self-pay | Admitting: Vascular Surgery

## 2018-06-13 ENCOUNTER — Other Ambulatory Visit: Payer: Self-pay

## 2018-06-13 VITALS — BP 168/71 | HR 86 | Resp 16 | Ht 64.5 in | Wt 116.0 lb

## 2018-06-13 DIAGNOSIS — L97522 Non-pressure chronic ulcer of other part of left foot with fat layer exposed: Secondary | ICD-10-CM | POA: Diagnosis not present

## 2018-06-13 DIAGNOSIS — Z992 Dependence on renal dialysis: Secondary | ICD-10-CM

## 2018-06-13 DIAGNOSIS — I739 Peripheral vascular disease, unspecified: Secondary | ICD-10-CM

## 2018-06-13 DIAGNOSIS — Z87891 Personal history of nicotine dependence: Secondary | ICD-10-CM | POA: Diagnosis not present

## 2018-06-13 DIAGNOSIS — T829XXS Unspecified complication of cardiac and vascular prosthetic device, implant and graft, sequela: Secondary | ICD-10-CM

## 2018-06-13 DIAGNOSIS — I713 Abdominal aortic aneurysm, ruptured, unspecified: Secondary | ICD-10-CM
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
## 2018-09-12 ENCOUNTER — Other Ambulatory Visit: Payer: Self-pay

## 2018-09-12 ENCOUNTER — Encounter (INDEPENDENT_AMBULATORY_CARE_PROVIDER_SITE_OTHER): Payer: Self-pay | Admitting: Vascular Surgery

## 2018-09-12 ENCOUNTER — Ambulatory Visit (INDEPENDENT_AMBULATORY_CARE_PROVIDER_SITE_OTHER): Payer: Medicare Other

## 2018-09-12 ENCOUNTER — Ambulatory Visit (INDEPENDENT_AMBULATORY_CARE_PROVIDER_SITE_OTHER): Payer: Medicare Other | Admitting: Vascular Surgery

## 2018-09-12 VITALS — BP 148/64 | HR 78 | Resp 16 | Wt 116.0 lb

## 2018-09-12 DIAGNOSIS — Z9889 Other specified postprocedural states: Secondary | ICD-10-CM

## 2018-09-12 DIAGNOSIS — T829XXS Unspecified complication of cardiac and vascular prosthetic device, implant and graft, sequela: Secondary | ICD-10-CM

## 2018-09-12 DIAGNOSIS — I713 Abdominal aortic aneurysm, ruptured, unspecified: Secondary | ICD-10-CM

## 2018-09-12 DIAGNOSIS — K219 Gastro-esophageal reflux disease without esophagitis: Secondary | ICD-10-CM | POA: Diagnosis not present

## 2018-09-12 DIAGNOSIS — Z7902 Long term (current) use of antithrombotics/antiplatelets: Secondary | ICD-10-CM

## 2018-09-12 DIAGNOSIS — Z992 Dependence on renal dialysis: Secondary | ICD-10-CM | POA: Diagnosis not present

## 2018-09-12 DIAGNOSIS — N186 End stage renal disease: Secondary | ICD-10-CM | POA: Diagnosis not present

## 2018-09-12 DIAGNOSIS — I739 Peripheral vascular disease, unspecified: Secondary | ICD-10-CM | POA: Diagnosis not present

## 2018-09-12 DIAGNOSIS — Z79899 Other long term (current) drug therapy: Secondary | ICD-10-CM

## 2018-09-12 DIAGNOSIS — J438 Other emphysema: Secondary | ICD-10-CM

## 2018-09-12 DIAGNOSIS — Z95828 Presence of other vascular implants and grafts: Secondary | ICD-10-CM

## 2018-09-18 ENCOUNTER — Encounter (INDEPENDENT_AMBULATORY_CARE_PROVIDER_SITE_OTHER): Payer: Self-pay | Admitting: Vascular Surgery
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
## 2018-12-19 ENCOUNTER — Ambulatory Visit (INDEPENDENT_AMBULATORY_CARE_PROVIDER_SITE_OTHER): Payer: Medicare Other | Admitting: Vascular Surgery

## 2018-12-19 ENCOUNTER — Ambulatory Visit (INDEPENDENT_AMBULATORY_CARE_PROVIDER_SITE_OTHER): Payer: Medicare Other

## 2018-12-19 ENCOUNTER — Other Ambulatory Visit: Payer: Self-pay

## 2018-12-19 ENCOUNTER — Encounter (INDEPENDENT_AMBULATORY_CARE_PROVIDER_SITE_OTHER): Payer: Self-pay | Admitting: Vascular Surgery

## 2018-12-19 VITALS — BP 163/68 | HR 87 | Resp 16

## 2018-12-19 DIAGNOSIS — I739 Peripheral vascular disease, unspecified: Secondary | ICD-10-CM

## 2018-12-19 DIAGNOSIS — K219 Gastro-esophageal reflux disease without esophagitis: Secondary | ICD-10-CM

## 2018-12-19 DIAGNOSIS — T82330S Leakage of aortic (bifurcation) graft (replacement), sequela: Secondary | ICD-10-CM | POA: Diagnosis not present

## 2018-12-19 DIAGNOSIS — I10 Essential (primary) hypertension: Secondary | ICD-10-CM

## 2018-12-19 DIAGNOSIS — I713 Abdominal aortic aneurysm, ruptured, unspecified: Secondary | ICD-10-CM

## 2018-12-19 DIAGNOSIS — T829XXS Unspecified complication of cardiac and vascular prosthetic device, implant and graft, sequela: Secondary | ICD-10-CM | POA: Diagnosis not present

## 2018-12-19 DIAGNOSIS — N186 End stage renal disease: Secondary | ICD-10-CM | POA: Diagnosis not present

## 2018-12-19 DIAGNOSIS — IMO0001 Reserved for inherently not codable concepts without codable children: Secondary | ICD-10-CM

## 2018-12-19 DIAGNOSIS — J438 Other emphysema: Secondary | ICD-10-CM
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
## 2018-12-26 ENCOUNTER — Other Ambulatory Visit: Payer: Self-pay

## 2018-12-28 ENCOUNTER — Encounter: Payer: Self-pay | Admitting: Certified Nurse Midwife

## 2018-12-28 ENCOUNTER — Other Ambulatory Visit: Payer: Self-pay

## 2018-12-28 ENCOUNTER — Other Ambulatory Visit (HOSPITAL_COMMUNITY)
Admission: RE | Admit: 2018-12-28 | Discharge: 2018-12-28 | Disposition: A | Discharge status: Home / Self Care | Payer: Medicare Other | Source: Ambulatory Visit | Attending: Certified Nurse Midwife | Admitting: Certified Nurse Midwife

## 2018-12-28 ENCOUNTER — Ambulatory Visit (INDEPENDENT_AMBULATORY_CARE_PROVIDER_SITE_OTHER): Payer: Medicare Other | Admitting: Certified Nurse Midwife

## 2018-12-28 ENCOUNTER — Ambulatory Visit: Payer: Medicare Other | Admitting: Certified Nurse Midwife

## 2018-12-28 ENCOUNTER — Other Ambulatory Visit: Payer: Self-pay | Admitting: Internal Medicine

## 2018-12-28 VITALS — BP 140/58 | HR 68 | Temp 97.1°F | Resp 16

## 2018-12-28 DIAGNOSIS — Z1231 Encounter for screening mammogram for malignant neoplasm of breast: Secondary | ICD-10-CM

## 2018-12-28 DIAGNOSIS — N289 Disorder of kidney and ureter, unspecified: Secondary | ICD-10-CM | POA: Insufficient documentation

## 2018-12-28 DIAGNOSIS — N891 Moderate vaginal dysplasia: Secondary | ICD-10-CM | POA: Diagnosis not present

## 2018-12-28 DIAGNOSIS — Z01419 Encounter for gynecological examination (general) (routine) without abnormal findings: Secondary | ICD-10-CM

## 2018-12-28 DIAGNOSIS — Z124 Encounter for screening for malignant neoplasm of cervix: Secondary | ICD-10-CM | POA: Insufficient documentation

## 2018-12-28 DIAGNOSIS — Z8742 Personal history of other diseases of the female genital tract: Secondary | ICD-10-CM | POA: Diagnosis present

## 2018-12-28 DIAGNOSIS — Z7409 Other reduced mobility: Secondary | ICD-10-CM

## 2018-12-28 DIAGNOSIS — Z87448 Personal history of other diseases of urinary system: Secondary | ICD-10-CM
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
## 2018-12-28 NOTE — Patient Instructions (Signed)

## 2018-12-30 LAB — CYTOLOGY - PAP: Diagnosis: NEGATIVE

## 2019-02-03 ENCOUNTER — Ambulatory Visit
Admission: RE | Admit: 2019-02-03 | Discharge: 2019-02-03 | Disposition: A | Discharge status: Home / Self Care | Payer: Medicare Other | Source: Ambulatory Visit | Attending: Internal Medicine | Admitting: Internal Medicine

## 2019-02-03 DIAGNOSIS — Z1231 Encounter for screening mammogram for malignant neoplasm of breast: Secondary | ICD-10-CM | POA: Diagnosis not present

## 2019-02-17 ENCOUNTER — Non-Acute Institutional Stay
Admission: EM | Admit: 2019-02-17 | Discharge: 2019-02-21 | Disposition: A | Discharge status: Skilled Nursing Facility | Payer: Medicare Other | Attending: Emergency Medicine | Admitting: Emergency Medicine

## 2019-02-17 ENCOUNTER — Emergency Department: Payer: Medicare Other

## 2019-02-17 ENCOUNTER — Encounter: Payer: Self-pay | Admitting: Emergency Medicine

## 2019-02-17 ENCOUNTER — Other Ambulatory Visit: Payer: Self-pay

## 2019-02-17 DIAGNOSIS — Z20828 Contact with and (suspected) exposure to other viral communicable diseases: Secondary | ICD-10-CM | POA: Insufficient documentation

## 2019-02-17 DIAGNOSIS — M48061 Spinal stenosis, lumbar region without neurogenic claudication: Secondary | ICD-10-CM | POA: Diagnosis not present

## 2019-02-17 DIAGNOSIS — W1789XA Other fall from one level to another, initial encounter: Secondary | ICD-10-CM | POA: Insufficient documentation

## 2019-02-17 DIAGNOSIS — N2581 Secondary hyperparathyroidism of renal origin: Secondary | ICD-10-CM | POA: Diagnosis not present

## 2019-02-17 DIAGNOSIS — E46 Unspecified protein-calorie malnutrition: Secondary | ICD-10-CM | POA: Diagnosis not present

## 2019-02-17 DIAGNOSIS — W19XXXA Unspecified fall, initial encounter: Secondary | ICD-10-CM

## 2019-02-17 DIAGNOSIS — Z823 Family history of stroke: Secondary | ICD-10-CM | POA: Insufficient documentation

## 2019-02-17 DIAGNOSIS — E876 Hypokalemia: Secondary | ICD-10-CM | POA: Insufficient documentation

## 2019-02-17 DIAGNOSIS — S3982XA Other specified injuries of lower back, initial encounter: Secondary | ICD-10-CM | POA: Insufficient documentation

## 2019-02-17 DIAGNOSIS — Z79899 Other long term (current) drug therapy: Secondary | ICD-10-CM | POA: Insufficient documentation

## 2019-02-17 DIAGNOSIS — I132 Hypertensive heart and chronic kidney disease with heart failure and with stage 5 chronic kidney disease, or end stage renal disease: Secondary | ICD-10-CM | POA: Insufficient documentation

## 2019-02-17 DIAGNOSIS — E039 Hypothyroidism, unspecified: Secondary | ICD-10-CM | POA: Insufficient documentation

## 2019-02-17 DIAGNOSIS — E785 Hyperlipidemia, unspecified: Secondary | ICD-10-CM | POA: Diagnosis not present

## 2019-02-17 DIAGNOSIS — K8020 Calculus of gallbladder without cholecystitis without obstruction: Secondary | ICD-10-CM | POA: Diagnosis not present

## 2019-02-17 DIAGNOSIS — R011 Cardiac murmur, unspecified: Secondary | ICD-10-CM | POA: Insufficient documentation

## 2019-02-17 DIAGNOSIS — W050XXA Fall from non-moving wheelchair, initial encounter: Secondary | ICD-10-CM | POA: Insufficient documentation

## 2019-02-17 DIAGNOSIS — Z8249 Family history of ischemic heart disease and other diseases of the circulatory system: Secondary | ICD-10-CM | POA: Insufficient documentation

## 2019-02-17 DIAGNOSIS — Z9981 Dependence on supplemental oxygen: Secondary | ICD-10-CM | POA: Insufficient documentation

## 2019-02-17 DIAGNOSIS — Z803 Family history of malignant neoplasm of breast: Secondary | ICD-10-CM | POA: Insufficient documentation

## 2019-02-17 DIAGNOSIS — Z7982 Long term (current) use of aspirin: Secondary | ICD-10-CM | POA: Diagnosis not present

## 2019-02-17 DIAGNOSIS — Z8049 Family history of malignant neoplasm of other genital organs: Secondary | ICD-10-CM | POA: Insufficient documentation

## 2019-02-17 DIAGNOSIS — J441 Chronic obstructive pulmonary disease with (acute) exacerbation: Secondary | ICD-10-CM | POA: Diagnosis not present

## 2019-02-17 DIAGNOSIS — D631 Anemia in chronic kidney disease: Secondary | ICD-10-CM | POA: Diagnosis not present

## 2019-02-17 DIAGNOSIS — K219 Gastro-esophageal reflux disease without esophagitis: Secondary | ICD-10-CM | POA: Diagnosis not present

## 2019-02-17 DIAGNOSIS — I714 Abdominal aortic aneurysm, without rupture: Secondary | ICD-10-CM | POA: Diagnosis not present

## 2019-02-17 DIAGNOSIS — N186 End stage renal disease: Secondary | ICD-10-CM | POA: Insufficient documentation

## 2019-02-17 DIAGNOSIS — M533 Sacrococcygeal disorders, not elsewhere classified: Secondary | ICD-10-CM | POA: Insufficient documentation

## 2019-02-17 DIAGNOSIS — Z992 Dependence on renal dialysis: Secondary | ICD-10-CM | POA: Diagnosis not present

## 2019-02-17 DIAGNOSIS — Z7951 Long term (current) use of inhaled steroids: Secondary | ICD-10-CM | POA: Insufficient documentation

## 2019-02-17 DIAGNOSIS — J9611 Chronic respiratory failure with hypoxia: Secondary | ICD-10-CM | POA: Insufficient documentation

## 2019-02-17 DIAGNOSIS — Z888 Allergy status to other drugs, medicaments and biological substances status: Secondary | ICD-10-CM | POA: Insufficient documentation

## 2019-02-17 DIAGNOSIS — M4316 Spondylolisthesis, lumbar region: Secondary | ICD-10-CM | POA: Insufficient documentation

## 2019-02-17 DIAGNOSIS — I739 Peripheral vascular disease, unspecified: Secondary | ICD-10-CM | POA: Diagnosis not present

## 2019-02-17 DIAGNOSIS — I5032 Chronic diastolic (congestive) heart failure: Secondary | ICD-10-CM | POA: Diagnosis not present

## 2019-02-17 DIAGNOSIS — Z87891 Personal history of nicotine dependence: Secondary | ICD-10-CM | POA: Insufficient documentation

## 2019-02-17 DIAGNOSIS — Z886 Allergy status to analgesic agent status: Secondary | ICD-10-CM | POA: Insufficient documentation

## 2019-02-17 DIAGNOSIS — S300XXA Contusion of lower back and pelvis, initial encounter: Secondary | ICD-10-CM | POA: Diagnosis present

## 2019-02-17 LAB — COMPREHENSIVE METABOLIC PANEL
ALT: 15 U/L (ref 0–44)
AST: 32 U/L (ref 15–41)
Albumin: 3.2 g/dL — ABNORMAL LOW (ref 3.5–5.0)
Alkaline Phosphatase: 97 U/L (ref 38–126)
Anion gap: 17 — ABNORMAL HIGH (ref 5–15)
BUN: 14 mg/dL (ref 8–23)
CO2: 27 mmol/L (ref 22–32)
Calcium: 9.4 mg/dL (ref 8.9–10.3)
Chloride: 98 mmol/L (ref 98–111)
Creatinine, Ser: 4.36 mg/dL — ABNORMAL HIGH (ref 0.44–1.00)
GFR calc Af Amer: 11 mL/min — ABNORMAL LOW (ref >60)
GFR calc non Af Amer: 10 mL/min — ABNORMAL LOW (ref >60)
Glucose, Bld: 81 mg/dL (ref 70–99)
Potassium: 3.2 mmol/L — ABNORMAL LOW (ref 3.5–5.1)
Sodium: 142 mmol/L (ref 135–145)
Total Bilirubin: 1.3 mg/dL — ABNORMAL HIGH (ref 0.3–1.2)
Total Protein: 6.4 g/dL — ABNORMAL LOW (ref 6.5–8.1)

## 2019-02-17 LAB — CBC WITH DIFFERENTIAL/PLATELET
Abs Immature Granulocytes: 0.1 10*3/uL — ABNORMAL HIGH (ref 0.00–0.07)
Basophils Absolute: 0.1 10*3/uL (ref 0.0–0.1)
Basophils Relative: 1 %
Eosinophils Absolute: 0.3 10*3/uL (ref 0.0–0.5)
Eosinophils Relative: 3 %
HCT: 28.8 % — ABNORMAL LOW (ref 36.0–46.0)
Hemoglobin: 10.2 g/dL — ABNORMAL LOW (ref 12.0–15.0)
Immature Granulocytes: 1 %
Lymphocytes Relative: 7 %
Lymphs Abs: 0.8 10*3/uL (ref 0.7–4.0)
MCH: 35.5 pg — ABNORMAL HIGH (ref 26.0–34.0)
MCHC: 35.4 g/dL (ref 30.0–36.0)
MCV: 100.3 fL — ABNORMAL HIGH (ref 80.0–100.0)
Monocytes Absolute: 0.7 10*3/uL (ref 0.1–1.0)
Monocytes Relative: 7 %
Neutro Abs: 9.1 10*3/uL — ABNORMAL HIGH (ref 1.7–7.7)
Neutrophils Relative %: 81 %
Platelets: 195 10*3/uL (ref 150–400)
RBC: 2.87 MIL/uL — ABNORMAL LOW (ref 3.87–5.11)
RDW: 14.4 % (ref 11.5–15.5)
WBC: 11.1 10*3/uL — ABNORMAL HIGH (ref 4.0–10.5)
nRBC: 0 % (ref 0.0–0.2)

## 2019-02-17 MED ORDER — HYDROCODONE-ACETAMINOPHEN 5-325 MG PO TABS
1.0000 | ORAL_TABLET | Freq: Once | ORAL | Status: AC
  Administered 2019-02-17: 1 via ORAL
  Filled 2019-02-17: qty 1

## 2019-02-17 MED ORDER — OXYCODONE-ACETAMINOPHEN 5-325 MG PO TABS
1.0000 | ORAL_TABLET | Freq: Once | ORAL | Status: AC
  Administered 2019-02-17: 1 via ORAL
  Filled 2019-02-17: qty 1

## 2019-02-17 MED ORDER — OXYCODONE-ACETAMINOPHEN 5-325 MG PO TABS: 1.0000 | ORAL_TABLET | ORAL | 0 refills | Status: DC | PRN

## 2019-02-17 MED ORDER — OXYCODONE-ACETAMINOPHEN 5-325 MG PO TABS
2.0000 | ORAL_TABLET | Freq: Once | ORAL | Status: AC
  Administered 2019-02-17: 2 via ORAL
  Filled 2019-02-17: qty 2

## 2019-02-17 NOTE — ED Provider Notes (Signed)
Imaging was negative including CT of the pelvis.  I offered skilled nursing facility placement but they have declined.  She is cleared for outpatient follow-up.   Earleen Newport, MD 02/17/19 [phone]

## 2019-02-17 NOTE — Evaluation (Signed)
Physical Therapy Evaluation Patient Details Name: [name] MRN: [id number] DOB: 21-Jan-1951 Today's Date: 02/17/2019   History of Present Illness  Per MD note: Pt is a 68 y.o. female who presents after a fall.  Patient fell out of her wheelchair landed on her bottom on the floor.  She complains of significant pain in her coccyx region.  No other injuries reported.  No chest pain abdominal pain.  No neck pain.  No nausea or vomiting.  Has not take anything for this. Imaging was negative including CT of the pelvis.  PMH includes: anemia, AAA, CHF, COPD, ESRD on HD, HTN, and chronic L foot ulcer (mostly healed per patient).    Clinical Impression  Pt presented with deficits in strength, transfers, mobility, gait, balance, and activity tolerance.  Pt was highly limited by low back/sacral pain with all mobility resulting in pt requiring Mod A during sup to/from and being unable to stand from the EOB despite multiple attempts with max verbal cues for sequencing.  In sitting at the EOB pt c/o severe low back pain that radiated down primarily the RLE, nursing notified. Pt presents with significant deficits in functional mobility compared to her baseline and would not be safe to return to her prior living situation at this time.  Pt will benefit from PT services in a SNF setting upon discharge to safely address above deficits for decreased caregiver assistance and eventual return to PLOF.       Follow Up Recommendations SNF    Equipment Recommendations  None recommended by PT    Recommendations for Other Services       Precautions / Restrictions Precautions Precautions: Fall Restrictions Weight Bearing Restrictions: No      Mobility  Bed Mobility Overal bed mobility: Needs Assistance Bed Mobility: Supine to Sit;Sit to Supine     Supine to sit: Mod assist Sit to supine: Mod assist   General bed mobility comments: Mod A for BLEs in/out of bed and for trunk control  Transfers                  General transfer comment: Pt unable to stand secondary to back pain  Ambulation/Gait                Stairs            Wheelchair Mobility    Modified Rankin (Stroke Patients Only)       Balance Overall balance assessment: Needs assistance   Sitting balance-Leahy Scale: Fair   Postural control: Posterior lean     Standing balance comment: Unable to stand                             Pertinent Vitals/Pain Pain Assessment: 0-10 Pain Score: 2  Pain Location: low back, sacral area Pain Descriptors / Indicators: Aching;Sore Pain Intervention(s): Premedicated before session;Monitored during session;Limited activity within patient's tolerance    Home Living Family/patient expects to be discharged to:: Private residence Living Arrangements: Spouse/significant other Available Help at Discharge: Family;Available 24 hours/day Type of Home: House Home Access: Ramped entrance     Home Layout: One level Home Equipment: Grab bars - tub/shower;Walker - 2 wheels;Wheelchair - manual      Prior Function Level of Independence: Independent with assistive device(s)         Comments: Mod Ind with amb HH distances with a RW, Ind with ADLs, no other fall history other than current fall slipping  out of w/c while leaning over to clean the tub     Hand Dominance        Extremity/Trunk Assessment   Upper Extremity Assessment Upper Extremity Assessment: Generalized weakness    Lower Extremity Assessment Lower Extremity Assessment: Generalized weakness;RLE deficits/detail;LLE deficits/detail RLE Deficits / Details: Unable to assess LE strength secondary to low back pain increasing with LE exertion RLE: Unable to fully assess due to pain LLE Deficits / Details: Unable to assess LE strength secondary to low back pain increasing with LE exertion LLE: Unable to fully assess due to pain       Communication   Communication: No  difficulties  Cognition Arousal/Alertness: Awake/alert Behavior During Therapy: WFL for tasks assessed/performed Overall Cognitive Status: Within Functional Limits for tasks assessed                                        General Comments      Exercises Total Joint Exercises Ankle Circles/Pumps: Strengthening;Both;5 reps;10 reps Quad Sets: Strengthening;Both;5 reps;10 reps Gluteal Sets: Strengthening;Both;5 reps;10 reps Heel Slides: AAROM;Both;10 reps Hip ABduction/ADduction: AAROM;Both;10 reps Other Exercises Other Exercises: HEP education and review for BLE APs, QS, GS x 10 each every 1-2 hours   Assessment/Plan    PT Assessment Patient needs continued PT services  PT Problem List Decreased strength;Decreased activity tolerance;Decreased balance;Pain       PT Treatment Interventions DME instruction;Gait training;Functional mobility training;Therapeutic activities;Therapeutic exercise;Balance training;Patient/family education    PT Goals (Current goals can be found in the Care Plan section)  Acute Rehab PT Goals Patient Stated Goal: To get back to walking PT Goal Formulation: With patient Time For Goal Achievement: 03/02/19 Potential to Achieve Goals: Good    Frequency Min 2X/week   Barriers to discharge Decreased caregiver support;Inaccessible home environment      Co-evaluation               AM-PAC PT "6 Clicks" Mobility  Outcome Measure Help needed turning from your back to your side while in a flat bed without using bedrails?: A Lot Help needed moving from lying on your back to sitting on the side of a flat bed without using bedrails?: A Lot Help needed moving to and from a bed to a chair (including a wheelchair)?: Total Help needed standing up from a chair using your arms (e.g., wheelchair or bedside chair)?: Total Help needed to walk in hospital room?: Total Help needed climbing 3-5 steps with a railing? : Total 6 Click Score: 8     End of Session Equipment Utilized During Treatment: Gait belt Activity Tolerance: Patient limited by pain Patient left: in bed;with call bell/phone within reach;with family/visitor present;Other (comment)(Bilateral side rails up in ED as pt was found) Nurse Communication: Mobility status;Other (comment);Patient requests pain meds(Pt c/o pain radiating down her RLE in sitting) PT Visit Diagnosis: Pain;Difficulty in walking, not elsewhere classified (R26.2);Muscle weakness (generalized) (M62.81) Pain - Right/Left: (Low back)    Time: 6468-0321 PT Time Calculation (min) (ACUTE ONLY): 38 min   Charges:   PT Evaluation $PT Eval Moderate Complexity: 1 Mod PT Treatments $Therapeutic Exercise: 8-22 mins        D. Royetta Asal PT, DPT 02/17/19, 5:42 PM

## 2019-02-17 NOTE — ED Triage Notes (Signed)
Pt arrives from home via [facility] following a mechanical fall this morning out of her wheel chair. C/ O pain in lower back and buttocks. Pt denies hitting head or LOC.

## 2019-02-17 NOTE — ED Notes (Signed)
PT attempted to have BM on bed pan, no BM

## 2019-02-17 NOTE — ED Provider Notes (Signed)
Patient cannot sit up and certainly cannot walk.  I have discussed with the family, she will need skilled nursing facility placement.  Have consulted social work, PT and nephrology.   Earleen Newport, MD 02/17/19 [phone]

## 2019-02-17 NOTE — ED Notes (Signed)
Pt bacck from x-ray and husband is at bedside.

## 2019-02-17 NOTE — ED Notes (Signed)
Patient transported to X-ray 

## 2019-02-17 NOTE — ED Notes (Signed)
Patient transported to CT 
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
## 2019-02-18 DIAGNOSIS — M533 Sacrococcygeal disorders, not elsewhere classified: Secondary | ICD-10-CM | POA: Diagnosis not present

## 2019-02-18 LAB — SARS CORONAVIRUS 2 (TAT 6-24 HRS): SARS Coronavirus 2: NEGATIVE

## 2019-02-18 MED ORDER — SODIUM CHLORIDE 0.9 % IV SOLN: 100.0000 mL | INTRAVENOUS | Status: DC | PRN

## 2019-02-18 MED ORDER — LIDOCAINE-PRILOCAINE 2.5-2.5 % EX CREA
1.0000 "application " | TOPICAL_CREAM | CUTANEOUS | Status: DC | PRN
  Administered 2019-02-20: 1 via TOPICAL
  Filled 2019-02-18: qty 5

## 2019-02-18 MED ORDER — EPOETIN ALFA 2000 UNIT/ML IJ SOLN
2000.0000 [IU] | INTRAMUSCULAR | Status: DC
  Administered 2019-02-18: 2000 [IU] via INTRAVENOUS
  Filled 2019-02-18: qty 1

## 2019-02-18 MED ORDER — ALTEPLASE 2 MG IJ SOLR
2.0000 mg | Freq: Once | INTRAMUSCULAR | Status: DC | PRN
  Filled 2019-02-18: qty 2

## 2019-02-18 MED ORDER — OXYCODONE-ACETAMINOPHEN 5-325 MG PO TABS
1.0000 | ORAL_TABLET | Freq: Once | ORAL | Status: AC
  Administered 2019-02-18: 1 via ORAL

## 2019-02-18 MED ORDER — SEVELAMER CARBONATE 800 MG PO TABS
1600.0000 mg | ORAL_TABLET | Freq: Three times a day (TID) | ORAL | Status: DC
  Administered 2019-02-19 – 2019-02-20 (×5): 1600 mg via ORAL
  Filled 2019-02-18 (×10): qty 2

## 2019-02-18 MED ORDER — VALACYCLOVIR HCL 500 MG PO TABS
500.0000 mg | ORAL_TABLET | Freq: Every day | ORAL | Status: DC
  Administered 2019-02-18 – 2019-02-21 (×4): 500 mg via ORAL
  Filled 2019-02-18 (×4): qty 1

## 2019-02-18 MED ORDER — OXYCODONE-ACETAMINOPHEN 5-325 MG PO TABS
1.0000 | ORAL_TABLET | Freq: Once | ORAL | Status: AC
  Administered 2019-02-18: 1 via ORAL
  Filled 2019-02-18: qty 1

## 2019-02-18 MED ORDER — PREDNISOLONE ACETATE 1 % OP SUSP: 1.0000 [drp] | OPHTHALMIC | Status: DC

## 2019-02-18 MED ORDER — LIDOCAINE HCL (PF) 1 % IJ SOLN: 5.0000 mL | INTRAMUSCULAR | Status: DC | PRN

## 2019-02-18 MED ORDER — DOCUSATE SODIUM 100 MG PO CAPS
100.0000 mg | ORAL_CAPSULE | Freq: Every day | ORAL | Status: DC | PRN
  Administered 2019-02-19: 100 mg via ORAL
  Filled 2019-02-18: qty 1

## 2019-02-18 MED ORDER — BIOTIN 5000 MCG PO CAPS: 5000.0000 ug | ORAL_CAPSULE | Freq: Every day | ORAL | Status: DC

## 2019-02-18 MED ORDER — AMLODIPINE BESYLATE 5 MG PO TABS
5.0000 mg | ORAL_TABLET | Freq: Every day | ORAL | Status: DC
  Administered 2019-02-19 – 2019-02-21 (×3): 5 mg via ORAL
  Filled 2019-02-18 (×3): qty 1

## 2019-02-18 MED ORDER — HYDRALAZINE HCL 50 MG PO TABS
25.0000 mg | ORAL_TABLET | Freq: Every day | ORAL | Status: DC
  Administered 2019-02-19 – 2019-02-20 (×2): 25 mg via ORAL
  Filled 2019-02-18 (×2): qty 1

## 2019-02-18 MED ORDER — PANTOPRAZOLE SODIUM 40 MG PO TBEC
40.0000 mg | DELAYED_RELEASE_TABLET | Freq: Every day | ORAL | Status: DC
  Administered 2019-02-19 – 2019-02-21 (×3): 40 mg via ORAL
  Filled 2019-02-18 (×3): qty 1

## 2019-02-18 MED ORDER — CHLORHEXIDINE GLUCONATE CLOTH 2 % EX PADS
6.0000 | MEDICATED_PAD | Freq: Every day | CUTANEOUS | Status: DC
  Filled 2019-02-18 (×3): qty 6

## 2019-02-18 MED ORDER — RENA-VITE PO TABS
1.0000 | ORAL_TABLET | Freq: Every day | ORAL | Status: DC
  Administered 2019-02-18 – 2019-02-20 (×3): 1 via ORAL
  Filled 2019-02-18 (×5): qty 1

## 2019-02-18 MED ORDER — METOPROLOL TARTRATE 25 MG PO TABS
25.0000 mg | ORAL_TABLET | Freq: Every day | ORAL | Status: DC
  Administered 2019-02-18 – 2019-02-21 (×4): 25 mg via ORAL
  Filled 2019-02-18 (×4): qty 1

## 2019-02-18 MED ORDER — ATORVASTATIN CALCIUM 20 MG PO TABS
40.0000 mg | ORAL_TABLET | Freq: Every day | ORAL | Status: DC
  Administered 2019-02-19 – 2019-02-20 (×2): 40 mg via ORAL
  Filled 2019-02-18 (×2): qty 2

## 2019-02-18 MED ORDER — LEVOTHYROXINE SODIUM 50 MCG PO TABS
50.0000 ug | ORAL_TABLET | ORAL | Status: DC
  Administered 2019-02-19: 100 ug via ORAL
  Filled 2019-02-18: qty 2

## 2019-02-18 MED ORDER — PENTAFLUOROPROP-TETRAFLUOROETH EX AERO: 1.0000 "application " | INHALATION_SPRAY | CUTANEOUS | Status: DC | PRN

## 2019-02-18 NOTE — ED Notes (Signed)
Pt assisted with bedpan for passage of flatus. Pt assisted with phone to call husband.

## 2019-02-18 NOTE — ED Notes (Signed)
Pt moved onto hospital bed. Pt instructed on controls of bed. Pt assisted with small brown formed stool.

## 2019-02-18 NOTE — ED Notes (Signed)
Pt transported to dialysis

## 2019-02-18 NOTE — ED Notes (Signed)
This RN spoke with pt's husband and gave update. Husband aware of pt's pending admission.

## 2019-02-18 NOTE — ED Notes (Signed)
Pt assisted onto bed pan for possible bowel movement.

## 2019-02-18 NOTE — ED Provider Notes (Signed)
Consult placed with nephrology, Dr. Annye Rusk, MD 02/18/19 [phone]

## 2019-02-18 NOTE — ED Notes (Signed)
Pending plan for dialysis per nephrologist. Will update patient once plan established.

## 2019-02-18 NOTE — ED Notes (Signed)
Called charge to request a hospital bed

## 2019-02-18 NOTE — Progress Notes (Signed)
Hd started  

## 2019-02-18 NOTE — Social Work (Signed)
1150 TOC CM/SW received social work consult for home health.  SW met w/pt at bedside to discuss her interest in [facility]. Patient agreed to [facility]. Medicare compared choice provided and copy filed. Corene Cornea from [facility] alerted.   HH order needed form attending.    Berenice Bouton, MSW, LCSW  [phone] 8am-6pm (weekends) or CSW ED # [phone]

## 2019-02-18 NOTE — ED Provider Notes (Signed)
Patient has returned from dialysis.  Face-to-face order and home health ordered, to include SW, PT, home health nurse aid based on evaluation by PT and SW.  Unfortunately, she still unable to ambulate due to her pain.  PT has seen and evaluated yesterday, and did recommend SNF level of care.  As such, will coordinate with SW in the morning to assist with placement.   Lilia Pro., MD 02/18/19 [phone]

## 2019-02-18 NOTE — Consult Note (Signed)
[name] MRN: [id number] DOB/AGE: 02-Sep-1950 68 y.o. Primary Care Physician:Johnston, Chrystie Nose, MD Admit date: 02/17/2019 Chief Complaint:  Chief Complaint  Patient presents with  . Fall   HPI: Patient is a 68 year old Caucasian woman with a past medical history of ESRD, hypertension, COPD, diastolic CHF who was brought to the ER with chief complaint of fall.  History of present illness dates back to yesterday when patient fell from her wheelchair and came to the ER with chief complaint of fall n. Upon presentation the ER patient was evaluated has had x-rays and CT of her pelvis.  No acute fractures were identified.  Patient continued to have pain and was unable to walk.  PT/social worker were involved,  patient was referred for admission  to skilled nursing facility versus discharge to home with home health care.  Patient is currently waiting placement.  Nephrology was called as patient is on hemodialysis on Tuesday Thursday Saturday schedule.  Patient seen today in the ER-patient main complaint was I have pain in my bottom. No history of head trauma No history of syncope No complaint of chest pain no current shortness of breath No complaint of nausea vomiting diarrhea No complaint of  Fever, cough or chills  Past Medical History:  Diagnosis Date  . Anemia   . Aneurysm ([place])    abdominal aortic  . Anxiety   . B12 deficiency   . Cancer ([place])    skin on left ankle 04/10/15 pt states she has never had cancer  . CHF (congestive heart failure) ([place])   . COPD (chronic obstructive pulmonary disease) ([place])   . Dialysis patient ([place])    Tues, Thurs, Sat  . Diastolic heart failure ([place])   . Diverticulosis   . ESRD (end stage renal disease) ([place])   . GERD (gastroesophageal reflux disease)   . Granuloma annulare 2010   skin- sees dermatologist  . Heart murmur   . Hyperlipidemia   . Hypertension   . Hypothyroidism   . On home oxygen therapy   . Renal insufficiency   . STD  (sexually transmitted disease)    chlamydia  . Thyroid disease   . VAIN II (vaginal intraepithelial neoplasia grade II) 7/09       Family History  Problem Relation Age of Onset  . Hypertension Other   . Hypertension Mother   . Stroke Mother   . Heart attack Father   . Cancer Sister        uterine cancer, removed ovaries  . Hypertension Sister   . Breast cancer Paternal Aunt     Social History:  reports that she quit smoking about 4 years ago. She smoked 0.00 packs per day for 45.00 years. She has never used smokeless tobacco. She reports that she does not drink alcohol or use drugs.   Allergies:  Allergies  Allergen Reactions  . Asa [Aspirin] Other (See Comments)    unknown  . 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 Plavix [Clopidogrel] Other (See Comments)    unknown    (Not in a hospital admission)      [id number] from the symptoms mentioned above,there are no other symptoms referable to all systems reviewed.     Physical Exam: Vital signs in last 24 hours: Temp:  [98 F (36.7 C)-98.1 F (36.7 C)] 98.1 F (36.7 C) (11/20 1501) Pulse Rate:  [92-106] 98 (11/21 0700) Resp:  [14-22] 16 (11/21 0330) BP: (124-156)/(53-72) 129/59 (11/21 0700) SpO2:  [  88 %-99 %] 96 % (11/21 0700) Weight:  [52.6 kg] 52.6 kg (11/20 1252) Weight change:     Intake/Output from previous day: No intake/output data recorded. No intake/output data recorded.   Physical Exam: General- pt is awake,alert, oriented to time place and person HEENT head is atraumatic normocephalic Resp- No acute REsp distress, NO Rhonchi CVS- S1S2 regular in rate and rhythm GIT- BS+, soft, NT, ND EXT- NO LE Edema, Cyanosis CNS- CN 2-12 grossly intact. Moving all 4 extremities Access- AVG positive bruit on exam   Lab Results: CBC Recent Labs    02/17/19 1722  WBC 11.1*  HGB 10.2*  HCT 28.8*  PLT 195    BMET Recent Labs    02/17/19 1722  NA 142  K 3.2*  CL 98   CO2 27  GLUCOSE 81  BUN 14  CREATININE 4.36*  CALCIUM 9.4    MICRO Recent Results (from the past 240 hour(s))  SARS CORONAVIRUS 2 (TAT 6-24 HRS) Nasopharyngeal Nasopharyngeal Swab     Status: None   Collection Time: 02/17/19  5:22 PM   Specimen: Nasopharyngeal Swab  Result Value Ref Range Status   SARS Coronavirus 2 NEGATIVE NEGATIVE Final    Comment: (NOTE) SARS-CoV-2 target nucleic acids are NOT DETECTED. The SARS-CoV-2 RNA is generally detectable in upper and lower respiratory specimens during the acute phase of infection. Negative results do not preclude SARS-CoV-2 infection, do not rule out co-infections with other pathogens, and should not be used as the sole basis for treatment or other patient management decisions. Negative results must be combined with clinical observations, patient history, and epidemiological information. The expected result is Negative. Fact Sheet for Patients: SugarRoll.be Fact Sheet for Healthcare Providers: https://www.woods-mathews.com/ This test is not yet approved or cleared by the [place] FDA and  has been authorized for detection and/or diagnosis of SARS-CoV-2 by FDA under an Emergency Use Authorization (EUA). This EUA will remain  in effect (meaning this test can be used) for the duration of the COVID-19 declaration under Section 56 4(b)(1) of the Act, 21 U.S.C. section 360bbb-3(b)(1), unless the authorization is terminated or revoked sooner. Performed at [facility], [place] [address], [place], [place] [address]       Lab Results  Component Value Date   PTH 31 06/18/2015   CALCIUM 9.4 02/17/2019   PHOS 4.6 06/20/2015      Impression: 1)Renal ESRD Patient is on hemodialysis Patient follows up with the [facility] nephrology Patient is on Tuesday Thursday Saturday schedule. We will dialyze patient today  2)HTN Patient blood pressure is at goal   3)Anemia of chronic disease   Patient hemoglobin is at goal(9--11) We will keep patient on Epogen during dialysis   4) secondary hyperparathyroidism CKD Mineral-Bone Disorder Patient calcium, phosphorus and PTH are at goal  5) fall  No acute fractures were identified  6) hypokalemia We will dialyze patient with the 3K bath  7)Acid base Co2 at goal     Plan:  We will dialyze patient today We will use 3K/2.5 calcium bath We will try to get in touch with patient's outpatient dialysis unit to get her hepatitis status Will not give any heparin as patient has HIT.    Manpreet s [name] 02/18/2019, 9:47 AM

## 2019-02-18 NOTE — ED Notes (Signed)
Pt reports has dialysis on Saturdays. Quale MD made aware. Pending nephrology consult.

## 2019-02-18 NOTE — Progress Notes (Signed)
Hd completed 

## 2019-02-18 NOTE — ED Provider Notes (Signed)
-----------------------------------------   7:27 AM on 02/18/2019 -----------------------------------------   Blood pressure (!) 129/59, pulse 98, temperature 98.1 F (36.7 C), temperature source Oral, resp. rate 16, height 5' 4.5" (1.638 m), weight 52.6 kg, last menstrual period 03/30/1988, SpO2 96 %.  The patient is resting at this time.  There have been no acute events since the last update.  Awaiting disposition plan from Social Work team(s).   Paulette Blanch, MD 02/18/19 [phone]

## 2019-02-18 NOTE — ED Provider Notes (Signed)
Social work advises patient needs home health order, RN, and social worker   Delman Kitten, MD 02/18/19 [phone]

## 2019-02-18 NOTE — ED Notes (Signed)
Pt resting in bed at this time. Denies pain at current position. Denies bathroom needs at this time. Diet ordered. Pending SW consult per report. Pt denies additional needs at this time. Will continue to monitor.

## 2019-02-19 DIAGNOSIS — M533 Sacrococcygeal disorders, not elsewhere classified: Secondary | ICD-10-CM | POA: Diagnosis not present

## 2019-02-19 MED ORDER — ACETAMINOPHEN 500 MG PO TABS
ORAL_TABLET | ORAL | Status: AC
  Filled 2019-02-19: qty 2

## 2019-02-19 MED ORDER — ACETAMINOPHEN 500 MG PO TABS
1000.0000 mg | ORAL_TABLET | Freq: Four times a day (QID) | ORAL | Status: DC | PRN
  Administered 2019-02-19: 1000 mg via ORAL
  Administered 2019-02-20: 650 mg via ORAL
  Administered 2019-02-20 – 2019-02-21 (×3): 1000 mg via ORAL
  Filled 2019-02-19 (×3): qty 2

## 2019-02-19 MED ORDER — OXYCODONE-ACETAMINOPHEN 5-325 MG PO TABS
1.0000 | ORAL_TABLET | Freq: Once | ORAL | Status: AC
  Administered 2019-02-19: 1 via ORAL
  Filled 2019-02-19: qty 1

## 2019-02-19 NOTE — ED Notes (Signed)
Pt assisted off bedpan; pt had small bowel movement. Pt cleansed and resting at this time. No other concerns voiced at this time.

## 2019-02-19 NOTE — ED Notes (Signed)
Report to alicia, rn.  

## 2019-02-19 NOTE — ED Notes (Signed)
Sent pharamacy a message to send renvela

## 2019-02-19 NOTE — TOC Initial Note (Signed)
Transition of Care [facility]) - Initial/Assessment Note    Patient Details  Name: [name] MRN: [id number] Date of Birth: 1950-06-21  Transition of Care [facility]) CM/SW Contact:    Berenice Bouton, LCSW Phone Number: 02/19/2019, 4:43 PM  Clinical Narrative:     SW assessment performed at pt's bedside.  Patient is a 68 year old woman wo presented to [facility] after a fall at home. Pt lives with her husband who is in the room during this assessment.   Patient sustained minimal injuries from her fall however she is struggling chorionic pain and pain increase when she attempts to move. Patient cooperative and alert during this assessment.  Discussed with patients [facility] attending recommendation for discharge to [facility] nursing [facility]. Discussed with patient PT recommendations for SNF. Patient agreed to SNF and gave SW verbal consent to begin process and paperwork.  [facility] nursing [facility] compare list handed to the patient.    Expected Discharge Plan: [place]    Patient Goals and CMS Choice Patient states their goals for this hospitalization and ongoing recovery are:: Patient wants to be able to sit up with out pain. CMS Medicare.gov Compare Post Acute Care list provided to:: Patient Choice offered to / list presented to : Patient  Expected Discharge Plan and Services Expected Discharge Plan: [place] In-house Referral: Clinical Social Work   Post Acute Care Choice: [place] Living arrangements for the past 2 months: [place]                  Prior Living Arrangements/Services Living arrangements for the past 2 months: Single Family Home Lives with:: Spouse Patient language and need for interpreter reviewed:: No Do you feel safe going back to the place where you live?: Yes      Need for Family Participation in Patient Care: No (Comment) Care giver support system in place?: Yes (comment)(The patinet's husband is very  supportive.)   Criminal Activity/Legal Involvement Pertinent to Current Situation/Hospitalization: No - Comment as needed  Activities of Daily Living      Permission Sought/Granted Permission sought to share information with : Family Supports(Spouse, [employer]) Permission granted to share information with : Yes, Verbal Permission Granted  Share Information with NAME: [name], spouse, [phone]  Permission granted to share info w AGENCY: [facility]  Permission granted to share info w Relationship: Spouse     Emotional Assessment Appearance:: Appears older than stated age Attitude/Demeanor/Rapport: Other (comment)(calm)   Orientation: : Oriented to Self, Oriented to Place, Oriented to  Time, Oriented to Situation Alcohol / Substance Use: Never Used Psych Involvement: No (comment)  Admission diagnosis:  fall Patient Active Problem List   Diagnosis Date Noted  . Renal disease 12/28/2018  . Chronic foot ulcer, left, with fat layer exposed ([place]) 03/08/2018  . Leg wound, left, initial encounter 05/19/2017  . Chronic respiratory failure with hypoxia ([place]) 03/01/2017  . PAD (peripheral artery disease) ([place]) 01/28/2017  . Aneurysm ([place]) 12/16/2016  . Complication from renal dialysis device 09/21/2016  . Encounter for continuous venovenous hemodiafiltration (CVVHD) ([place]) 09/25/2015  . ESRD (end stage renal disease) ([place]) 09/04/2015  . Infection due to vancomycin resistant Enterococcus (VRE) 07/01/2015  . Vascular device, implant, or graft infection or inflammation, subsequent encounter 07/01/2015  . Anemia 06/15/2015  . Complication of vascular access for dialysis 04/22/2015  . Thrombocytopenia ([place]) 04/22/2015  . Anemia of chronic disease 04/22/2015  . H/O blood transfusion reaction 04/22/2015  . HIT (heparin-induced thrombocytopenia) (  [place]) 04/22/2015  . Generalized weakness 04/22/2015  . Malnutrition of moderate degree 04/19/2015  . Acute pulmonary edema  with congestive heart failure ([place]) 04/09/2015  . Leukocytosis 04/09/2015  . Elevated troponin 04/09/2015  . Acute diastolic CHF (congestive heart failure) ([place]) 04/09/2015  . Pneumonia due to Pseudomonas species ([place]) 03/01/2015  . Chronic obstructive pulmonary disease, unspecified ([place]) 02/26/2015  . S/P dialysis catheter insertion ([place]) 02/22/2015  . Endoleak post (EVAR) endovascular aneurysm repair ([place]) 02/16/2015  . COPD exacerbation ([place]) 01/03/2015  . Alopecia 09/24/2014  . VAIN II (vaginal intraepithelial neoplasia grade II) 11/22/2013    Class: History of  . Postmenopausal HRT (hormone replacement therapy) 09/18/2013  . Pernicious anemia 09/18/2013  . Elevated liver function tests 09/14/2011  . Hypothyroidism 05/30/2010  . HLD (hyperlipidemia) 05/30/2010  . Essential hypertension 05/30/2010  . Ruptured abdominal aortic aneurysm (AAA) ([place]) 05/30/2010  . Chronic obstructive pulmonary disease ([place]) 05/30/2010  . GERD 05/30/2010   PCP:  Baxter Hire, MD Pharmacy:   [place], [place] - [address] [address] Phone: [phone] Fax: [phone]   Social Determinants of Health (SDOH) Interventions    Readmission Risk Interventions No flowsheet data found.

## 2019-02-19 NOTE — Progress Notes (Signed)
[name]  MRN: [id number]  DOB/AGE: 70-Sep-1952 70 y.o.  Primary Care Physician:Johnston, Chrystie Nose, MD  Admit date: 02/17/2019  Chief Complaint:  Chief Complaint  Patient presents with  . Fall    S-Pt presented on  02/17/2019 with  Chief Complaint  Patient presents with  . Fall  .    Pt today feels better.  Patient states her pain is better than yesterday.  Patient husband was present in the home patient has no other specific concerns  Medications  . amLODipine  5 mg Oral Daily  . atorvastatin  40 mg Oral q1800  . Chlorhexidine Gluconate Cloth  6 each Topical Q0600  . [START ON 02/21/2019] epoetin (EPOGEN/PROCRIT) injection  2,000 Units Intravenous Q T,Th,Sa-HD  . hydrALAZINE  25 mg Oral q1800  . levothyroxine  50-100 mcg Oral See admin instructions  . metoprolol tartrate  25 mg Oral Daily  . multivitamin  1 tablet Oral q1800  . pantoprazole  40 mg Oral Daily  . prednisoLONE acetate  1 drop Left Eye See admin instructions  . sevelamer carbonate  1,600 mg Oral TID WC  . valACYclovir  500 mg Oral Daily         XNA:TFTDD from the symptoms mentioned above,there are no other symptoms referable to all systems reviewed.  Physical Exam: Vital signs in last 24 hours: Temp:  [98.3 F (36.8 C)] 98.3 F (36.8 C) (11/21 1630) Pulse Rate:  [89-119] 102 (11/22 1001) Resp:  [11-25] 18 (11/22 1000) BP: (92-157)/(50-79) 138/61 (11/22 1000) SpO2:  [92 %-99 %] 93 % (11/22 1001) Weight change:     Intake/Output from previous day: 11/21 0701 - 11/22 0700 In: -  Out: 700  No intake/output data recorded.   Physical Exam: General- pt is awake,alert, oriented to time place and person Resp- No acute REsp distress, CTA B/L NO Rhonchi CVS- S1S2 regular in rate and rhythm GIT- BS+, soft, NT, ND EXT- NO LE Edema, Cyanosis Access patient has left AV graft with positive bruit  Lab Results: CBC Recent Labs    02/17/19 1722  WBC 11.1*  HGB 10.2*  HCT 28.8*  PLT 195     BMET Recent Labs    02/17/19 1722  NA 142  K 3.2*  CL 98  CO2 27  GLUCOSE 81  BUN 14  CREATININE 4.36*  CALCIUM 9.4    MICRO Recent Results (from the past 240 hour(s))  SARS CORONAVIRUS 2 (TAT 6-24 HRS) Nasopharyngeal Nasopharyngeal Swab     Status: None   Collection Time: 02/17/19  5:22 PM   Specimen: Nasopharyngeal Swab  Result Value Ref Range Status   SARS Coronavirus 2 NEGATIVE NEGATIVE Final    Comment: (NOTE) SARS-CoV-2 target nucleic acids are NOT DETECTED. The SARS-CoV-2 RNA is generally detectable in upper and lower respiratory specimens during the acute phase of infection. Negative results do not preclude SARS-CoV-2 infection, do not rule out co-infections with other pathogens, and should not be used as the sole basis for treatment or other patient management decisions. Negative results must be combined with clinical observations, patient history, and epidemiological information. The expected result is Negative. Fact Sheet for Patients: SugarRoll.be Fact Sheet for Healthcare Providers: https://www.woods-mathews.com/ This test is not yet approved or cleared by the [place] FDA and  has been authorized for detection and/or diagnosis of SARS-CoV-2 by FDA under an Emergency Use Authorization (EUA). This EUA will remain  in effect (meaning this test can be used) for the duration of the COVID-19  declaration under Section 56 4(b)(1) of the Act, 21 U.S.C. section 360bbb-3(b)(1), unless the authorization is terminated or revoked sooner. Performed at [facility], [place] [address], [place], [place] [address]       Lab Results  Component Value Date   PTH 31 06/18/2015   CALCIUM 9.4 02/17/2019   PHOS 4.6 06/20/2015               Impression:  Patient is a 68 year old Caucasian female with past medical history of ESRD, anemia of chronic disease, hypertension, diastolic CHF who came to the ER with  chief complaint of fall.  Patient did not have any acute fractures patient is still in the ER awaiting placement.  Social worker/ OT /PT are following the patient/helping with basement   1)Renal ESRD Patient is on hemodialysis Patient follows up with the [facility] nephrology Patient is on Tuesday Thursday Saturday schedule. Patient was dialyzed yesterday No need of hemodialysis today  2)HTN Patient blood pressure is at goal   3)Anemia of chronic disease  Patient hemoglobin is at goal(9--11) On Epogen during dialysis   4) secondary hyperparathyroidism CKD Mineral-Bone Disorder Patient calcium, phosphorus and PTH are at goal  5) fall  No acute fractures were identified  6) hypokalemia Patient was dialyzed with a high potassium bath  7)Acid base Co2 at goal     Plan:   Will dialyze patient on Tuesday /tomorrow depending upon if she is still inpatient and patient's outpatient dialysis schedule.    [name] s [name] 02/19/2019, 10:12 AM

## 2019-02-19 NOTE — NC FL2 (Signed)
[place] LEVEL OF CARE SCREENING TOOL     IDENTIFICATION  Patient Name: [name] Birthdate: 1950-11-26 Sex: female Admission Date (Current Location): 02/17/2019  [place] and [place] Number:  Engineering geologist and Address:  [facility], [address] [place], [place] [address]      Provider Number: [id number]  Attending Physician Name and Address:  No att. providers found  Relative Name and Phone Number:  Amoura Ransier [phone]    Current Level of Care: SNF Recommended Level of Care: [place] Prior Approval Number:    Date Approved/Denied:   PASRR Number: Pending  Discharge Plan: SNF    Current Diagnoses: Patient Active Problem List   Diagnosis Date Noted  . Renal disease 12/28/2018  . Chronic foot ulcer, left, with fat layer exposed ([place]) 03/08/2018  . Leg wound, left, initial encounter 05/19/2017  . Chronic respiratory failure with hypoxia ([place]) 03/01/2017  . PAD (peripheral artery disease) ([place]) 01/28/2017  . Aneurysm ([place]) 12/16/2016  . Complication from renal dialysis device 09/21/2016  . Encounter for continuous venovenous hemodiafiltration (CVVHD) ([place]) 09/25/2015  . ESRD (end stage renal disease) ([place]) 09/04/2015  . Infection due to vancomycin resistant Enterococcus (VRE) 07/01/2015  . Vascular device, implant, or graft infection or inflammation, subsequent encounter 07/01/2015  . Anemia 06/15/2015  . Complication of vascular access for dialysis 04/22/2015  . Thrombocytopenia ([place]) 04/22/2015  . Anemia of chronic disease 04/22/2015  . H/O blood transfusion reaction 04/22/2015  . HIT (heparin-induced thrombocytopenia) ([place]) 04/22/2015  . Generalized weakness 04/22/2015  . Malnutrition of moderate degree 04/19/2015  . Acute pulmonary edema with congestive heart failure ([place]) 04/09/2015  . Leukocytosis 04/09/2015  . Elevated troponin 04/09/2015  . Acute diastolic CHF (congestive heart  failure) ([place]) 04/09/2015  . Pneumonia due to Pseudomonas species ([place]) 03/01/2015  . Chronic obstructive pulmonary disease, unspecified ([place]) 02/26/2015  . S/P dialysis catheter insertion ([place]) 02/22/2015  . Endoleak post (EVAR) endovascular aneurysm repair ([place]) 02/16/2015  . COPD exacerbation ([place]) 01/03/2015  . Alopecia 09/24/2014  . VAIN II (vaginal intraepithelial neoplasia grade II) 11/22/2013    Class: History of  . Postmenopausal HRT (hormone replacement therapy) 09/18/2013  . Pernicious anemia 09/18/2013  . Elevated liver function tests 09/14/2011  . Hypothyroidism 05/30/2010  . HLD (hyperlipidemia) 05/30/2010  . Essential hypertension 05/30/2010  . Ruptured abdominal aortic aneurysm (AAA) ([place]) 05/30/2010  . Chronic obstructive pulmonary disease ([place]) 05/30/2010  . GERD 05/30/2010    Orientation RESPIRATION BLADDER Height & Weight     Self, Time, Situation, Place  Normal Continent Weight: 116 lb (52.6 kg) Height:  5' 4.5" (163.8 cm)  BEHAVIORAL SYMPTOMS/MOOD NEUROLOGICAL BOWEL NUTRITION STATUS      Continent    AMBULATORY STATUS COMMUNICATION OF NEEDS Skin   Extensive Assist Verbally Normal                       Personal Care Assistance Level of Assistance  Bathing, Feeding, Dressing, Total care Bathing Assistance: Limited assistance Feeding assistance: Independent Dressing Assistance: Limited assistance     Functional Limitations Info             SPECIAL CARE FACTORS FREQUENCY  PT (By licensed PT), OT (By licensed OT)     PT Frequency: 5x OT Frequency: 3x            Contractures      Additional Factors Info  Code Status, Allergies Code Status Info: Full Code Allergies  Info: Asa Aspirin, Heparin, Plavix Clopidogrel           Current Medications (02/19/2019):  This is the current hospital active medication list Current Facility-Administered Medications  Medication Dose Route Frequency Provider Last Rate Last Dose  . 0.9 %  sodium  chloride infusion  100 mL Intravenous PRN Bhutani, Manpreet S, MD      . 0.9 %  sodium chloride infusion  100 mL Intravenous PRN Bhutani, Manpreet S, MD      . acetaminophen (TYLENOL) tablet 1,000 mg  1,000 mg Oral Q6H PRN Merlyn Lot, MD   1,000 mg at 02/19/19 1228  . alteplase (CATHFLO ACTIVASE) injection 2 mg  2 mg Intracatheter Once PRN Bhutani, Manpreet S, MD      . amLODipine (NORVASC) tablet 5 mg  5 mg Oral Daily Lilia Pro., MD   5 mg at 02/19/19 1014  . atorvastatin (LIPITOR) tablet 40 mg  40 mg Oral q1800 Lilia Pro., MD      . Chlorhexidine Gluconate Cloth 2 % PADS 6 each  6 each Topical Q0600 Bhutani, Manpreet S, MD      . docusate sodium (COLACE) capsule 100 mg  100 mg Oral Daily PRN Lilia Pro., MD   100 mg at 02/19/19 1228  . [START ON 02/21/2019] epoetin alfa (EPOGEN) injection 2,000 Units  2,000 Units Intravenous Q T,Th,Sa-HD Bhutani, Manpreet S, MD   2,000 Units at 02/18/19 1424  . hydrALAZINE (APRESOLINE) tablet 25 mg  25 mg Oral q1800 Lilia Pro., MD      . levothyroxine (SYNTHROID) tablet 50-100 mcg  50-100 mcg Oral See admin instructions Merlyn Lot, MD   100 mcg at 02/19/19 1015  . lidocaine (PF) (XYLOCAINE) 1 % injection 5 mL  5 mL Intradermal PRN Bhutani, Manpreet S, MD      . lidocaine-prilocaine (EMLA) cream 1 application  1 application Topical PRN Bhutani, Manpreet S, MD      . metoprolol tartrate (LOPRESSOR) tablet 25 mg  25 mg Oral Daily Lilia Pro., MD   25 mg at 02/19/19 1014  . multivitamin (RENA-VIT) tablet 1 tablet  1 tablet Oral O7121 Lilia Pro., MD   1 tablet at 02/18/19 2108  . pantoprazole (PROTONIX) EC tablet 40 mg  40 mg Oral Daily Lilia Pro., MD   40 mg at 02/19/19 1014  . pentafluoroprop-tetrafluoroeth (GEBAUERS) aerosol 1 application  1 application Topical PRN Bhutani, Manpreet S, MD      . prednisoLONE acetate (PRED FORTE) 1 % ophthalmic suspension 1 drop  1 drop Left Eye See admin instructions Lilia Pro., MD       . sevelamer carbonate (RENVELA) tablet 1,600 mg  1,600 mg Oral TID WC Lilia Pro., MD   1,600 mg at 02/19/19 1147  . valACYclovir (VALTREX) tablet 500 mg  500 mg Oral Daily Lilia Pro., MD   500 mg at 02/19/19 1014   Current Outpatient Medications  Medication Sig Dispense Refill  . albuterol (PROVENTIL HFA;VENTOLIN HFA) 108 (90 Base) MCG/ACT inhaler Inhale 2 puffs into the lungs every 6 (six) hours as needed for wheezing or shortness of breath.    Marland Kitchen amLODipine (NORVASC) 5 MG tablet TAKE ONE TABLET EVERY DAY (Patient taking differently: Take 5 mg by mouth daily. ) 30 tablet 11  . aspirin 325 MG tablet Take 325 mg by mouth daily.     Marland Kitchen atorvastatin (LIPITOR) 40 MG tablet Take 1 tablet (40 mg total) by mouth daily. (  Patient taking differently: Take 40 mg by mouth daily at 6 PM. ) 30 tablet 2  . B Complex-C-Folic Acid (RENA-VITE PO) Take 1 tablet by mouth daily at 6 PM.     . Biotin 5000 MCG CAPS Take 5,000 mcg by mouth daily at 6 PM.     . ciprofloxacin (CIPRO) 250 MG tablet Take 250 mg by mouth daily at 6 PM.     . cyclobenzaprine (FLEXERIL) 5 MG tablet Take 5 mg by mouth daily as needed for muscle spasms.     Mariane Baumgarten Sodium 100 MG capsule Take 100 mg by mouth daily as needed for constipation.     . fluticasone (FLONASE) 50 MCG/ACT nasal spray Place 2 sprays into both nostrils daily as needed for allergies.     . hydrALAZINE (APRESOLINE) 25 MG tablet Take 1 tablet (25 mg total) by mouth daily. (Patient taking differently: Take 25 mg by mouth daily at 6 PM. ) 90 tablet 0  . levothyroxine (SYNTHROID, LEVOTHROID) 100 MCG tablet Take 1 tablet (100 mcg total) by mouth daily. (Patient taking differently: Take 50-100 mcg by mouth See admin instructions. Take 50 mcg by mouth daily on Saturday and take 100 mcg by mouth daily on all other days) 90 tablet 3  . lidocaine-prilocaine (EMLA) cream Apply 1 application topically as needed (for port access).     . metoprolol tartrate (LOPRESSOR) 25 MG  tablet Take 1 tablet (25 mg total) by mouth daily. 90 tablet 0  . omeprazole (PRILOSEC) 40 MG capsule Take 40 mg by mouth daily.     . prednisoLONE acetate (PRED FORTE) 1 % ophthalmic suspension Place 1 drop into the left eye See admin instructions. Place 1 drop in left eye every 2-3 days    . sevelamer carbonate (RENVELA) 800 MG tablet Take 1,600 mg by mouth 3 (three) times daily with meals.     . sodium chloride (OCEAN) 0.65 % SOLN nasal spray Place 1 spray into both nostrils daily as needed for congestion.     . valACYclovir (VALTREX) 500 MG tablet Take 500 mg by mouth daily.    Marland Kitchen oxyCODONE-acetaminophen (PERCOCET) 5-325 MG tablet Take 1 tablet by mouth every 4 (four) hours as needed for severe pain. 20 tablet 0     Discharge Medications: Please see discharge summary for a list of discharge medications.  Relevant Imaging Results:  Relevant Lab Results:   Additional Information SS# [id number]  Berenice Bouton, LCSW

## 2019-02-19 NOTE — ED Notes (Signed)
Pt placed on bedpan

## 2019-02-20 DIAGNOSIS — M533 Sacrococcygeal disorders, not elsewhere classified: Secondary | ICD-10-CM | POA: Diagnosis not present

## 2019-02-20 MED ORDER — LEVOTHYROXINE SODIUM 50 MCG PO TABS
100.0000 ug | ORAL_TABLET | ORAL | Status: DC
  Administered 2019-02-21: 100 ug via ORAL
  Filled 2019-02-20: qty 2

## 2019-02-20 MED ORDER — CIPROFLOXACIN HCL 500 MG PO TABS
250.0000 mg | ORAL_TABLET | Freq: Every day | ORAL | Status: DC
  Administered 2019-02-20: 250 mg via ORAL
  Filled 2019-02-20: qty 1

## 2019-02-20 MED ORDER — LEVOTHYROXINE SODIUM 50 MCG PO TABS: 50.0000 ug | ORAL_TABLET | ORAL | Status: DC

## 2019-02-20 NOTE — ED Notes (Signed)
This RN to bedside at this time, pt resting in bed, explained will bring meal tray when they come back. Pt states understanding. Will continue to monitor for further patient needs.

## 2019-02-20 NOTE — ED Notes (Signed)
Pt transported to dialysis by transport tech, report given to [place], Therapist, sports.

## 2019-02-20 NOTE — ED Notes (Signed)
Telfa dressing applied to abrasion to pt's R mid-back per patient's request. No bleeding noted at this time.

## 2019-02-20 NOTE — ED Notes (Signed)
This RN to bedside, introduced self to patient and husband. CSW also to bedside, per CSW patient has been accepted at [facility]. Possibly to go today pending how patient feels after dialysis.

## 2019-02-20 NOTE — Progress Notes (Signed)
Established hemodialysis patient known at [place] ([place]) TTS 11:15, normally transport by family. This week only due to Thanksgiving patients treatment days are Monday 11/23, Wednesday 11/ 25, then resume normal schedule on Saturday 11/28, chair time remains the same for all days. Please contact me with any dialysis placement concerns.  Elvera Bicker Dialysis Coordinator [phone]

## 2019-02-20 NOTE — Progress Notes (Signed)
Hd started  

## 2019-02-20 NOTE — ED Notes (Signed)
Pt repositioned in bed at this time. Pt tolerated okay. Pt states repeated moaning due to pain in her sacral area. This RN encouraged patient reposition so that patient would not become bed bound. Pt states understanding. This RN encouraged patient to eat at this time. Pt states understanding.

## 2019-02-20 NOTE — ED Notes (Signed)
Pt pants and underwear put on, awaiting cleaning pads

## 2019-02-20 NOTE — Progress Notes (Signed)
[facility]  ROUNDING NOTE   Subjective:  Patient still having some right leg pain. Due for hemodialysis today.   Objective:  Vital signs in last 24 hours:  Temp:  [97.6 F (36.4 C)] 97.6 F (36.4 C) (11/23 1325) Pulse Rate:  [90-105] 90 (11/23 1330) Resp:  [16-23] 23 (11/23 1330) BP: (132-162)/(51-76) 152/61 (11/23 1330) SpO2:  [91 %-100 %] 100 % (11/23 1030) Weight:  [55.9 kg] 55.9 kg (11/23 1325)  Weight change:  Filed Weights   02/17/19 1252 02/20/19 1325  Weight: 52.6 kg 55.9 kg    Intake/Output: No intake/output data recorded.   Intake/Output this shift:  No intake/output data recorded.  Physical Exam: General: No acute distress  Head: Normocephalic, atraumatic. Moist oral mucosal membranes  Eyes: Anicteric  Neck: Supple, trachea midline  Lungs:  Clear to auscultation, normal effort  Heart: S1S2 no rubs  Abdomen:  Soft, nontender, bowel sounds present  Extremities:  peripheral edema.  Neurologic: Awake, alert, following commands  Skin: No lesions  Access: LUE AVF    Basic Metabolic Panel: Recent Labs  Lab 02/17/19 1722  NA 142  K 3.2*  CL 98  CO2 27  GLUCOSE 81  BUN 14  CREATININE 4.36*  CALCIUM 9.4    Liver Function Tests: Recent Labs  Lab 02/17/19 1722  AST 32  ALT 15  ALKPHOS 97  BILITOT 1.3*  PROT 6.4*  ALBUMIN 3.2*   No results for input(s): LIPASE, AMYLASE in the last 168 hours. No results for input(s): AMMONIA in the last 168 hours.  CBC: Recent Labs  Lab 02/17/19 1722  WBC 11.1*  NEUTROABS 9.1*  HGB 10.2*  HCT 28.8*  MCV 100.3*  PLT 195    Cardiac Enzymes: No results for input(s): CKTOTAL, CKMB, CKMBINDEX, TROPONINI in the last 168 hours.  BNP: Invalid input(s): POCBNP  CBG: No results for input(s): GLUCAP in the last 168 hours.  Microbiology: Results for orders placed or performed during the hospital encounter of 02/17/19  SARS CORONAVIRUS 2 (TAT 6-24 HRS) Nasopharyngeal Nasopharyngeal Swab      Status: None   Collection Time: 02/17/19  5:22 PM   Specimen: Nasopharyngeal Swab  Result Value Ref Range Status   SARS Coronavirus 2 NEGATIVE NEGATIVE Final    Comment: (NOTE) SARS-CoV-2 target nucleic acids are NOT DETECTED. The SARS-CoV-2 RNA is generally detectable in upper and lower respiratory specimens during the acute phase of infection. Negative results do not preclude SARS-CoV-2 infection, do not rule out co-infections with other pathogens, and should not be used as the sole basis for treatment or other patient management decisions. Negative results must be combined with clinical observations, patient history, and epidemiological information. The expected result is Negative. Fact Sheet for Patients: SugarRoll.be Fact Sheet for Healthcare Providers: https://www.woods-mathews.com/ This test is not yet approved or cleared by the [place] FDA and  has been authorized for detection and/or diagnosis of SARS-CoV-2 by FDA under an Emergency Use Authorization (EUA). This EUA will remain  in effect (meaning this test can be used) for the duration of the COVID-19 declaration under Section 56 4(b)(1) of the Act, 21 U.S.C. section 360bbb-3(b)(1), unless the authorization is terminated or revoked sooner. Performed at [facility], [place] [address] [place], [place] [address]     Coagulation Studies: No results for input(s): LABPROT, INR in the last 72 hours.  Urinalysis: No results for input(s): COLORURINE, LABSPEC, PHURINE, GLUCOSEU, HGBUR, BILIRUBINUR, KETONESUR, PROTEINUR, UROBILINOGEN, NITRITE, LEUKOCYTESUR in the last 72 hours.  Invalid input(s): APPERANCEUR    Imaging: No results found.   Medications:   . sodium chloride    . sodium chloride     . amLODipine  5 mg Oral Daily  . atorvastatin  40 mg Oral q1800  . Chlorhexidine Gluconate Cloth  6 each Topical Q0600  . ciprofloxacin  250 mg Oral q1800  . [START ON  02/21/2019] epoetin (EPOGEN/PROCRIT) injection  2,000 Units Intravenous Q T,Th,Sa-HD  . hydrALAZINE  25 mg Oral q1800  . levothyroxine  50-100 mcg Oral See admin instructions  . metoprolol tartrate  25 mg Oral Daily  . multivitamin  1 tablet Oral q1800  . pantoprazole  40 mg Oral Daily  . prednisoLONE acetate  1 drop Left Eye See admin instructions  . sevelamer carbonate  1,600 mg Oral TID WC  . valACYclovir  500 mg Oral Daily   sodium chloride, sodium chloride, acetaminophen, alteplase, docusate sodium, lidocaine (PF), lidocaine-prilocaine, pentafluoroprop-tetrafluoroeth  Assessment/ Plan:  68 y.o. female with COPD, hypertension, hyperlipidemia, vaginal intraepithelial neoplasia, emergent AAA repair status post rupture on February 15, 2015,  left to right femoral to femoral bypass , acute renal failure leading to permanent dialysis, significant 3 vessel disease, HIT positive, thrombocytopenia admitted now status post fall.  1.  Hypertension. 2.  Anemia of CKD. 3. Secondary hyperparathyroidism. 4.  S/p Fall.  Plan:  We will plan for hemodialysis today as she is she is still outpatient status.  She normally dialyzes on TTS however given Thanksgiving holiday schedule her day has been shifted back 1 day for this week.  Therefore we will schedule dialysis treatment today for the patient.  Thereafter her next treatment will be on Wednesday as an outpatient.  Hold off on Epogen at the moment.  Maintain the patient on Renvela 2 tablets p.o. 3 times daily with meals.  Otherwise disposition as per emergency department.   LOS: 0 Adayah Arocho 11/23/20202:29 PM

## 2019-02-20 NOTE — Progress Notes (Signed)
This note also relates to the following rows which could not be included: Resp - Cannot attach notes to unvalidated device data  Hd completed

## 2019-02-20 NOTE — ED Provider Notes (Signed)
-----------------------------------------   6:06 AM on 02/20/2019 -----------------------------------------  Assumed care from Dr. Ellender Hose.  In short, [name] is a 68 y.o. female .  She is awaiting PT/OT evaluation and social work placement due to her inability to ambulate and need for additional assistance with ADLs.   Hinda Kehr, MD 02/20/19 [phone]

## 2019-02-20 NOTE — Social Work (Addendum)
CSW spoke with patient and husband. Patient shared that she would like to accept the bed at [facility].  Claiborne Billings at [facility] care, and she shared that the facility is currently not transporting patients to dialysis due to [place].   11:28am - Due to patient getting dialysis today at 12pm, she asked if she would be able to transport to [facility] tomorrow morning. Tina at [employer] [employer] shared that was okay, and would just need to know the time, days, and location where patient receives Dialysis. CSW will obtain this information for her.  Patient asked if she would be able to get a private room, and she will be.   EDP and RN aware that patient will be able to leave tomorrow morning.    [place], [place] [facility]  [phone]

## 2019-02-21 DIAGNOSIS — M533 Sacrococcygeal disorders, not elsewhere classified: Secondary | ICD-10-CM | POA: Diagnosis not present

## 2019-02-21 NOTE — ED Provider Notes (Signed)
-----------------------------------------   10:13 AM on 02/21/2019 -----------------------------------------  Patient has been accepted for placement at [facility].  No ongoing acute issues at this time.  Patient and family agree with plan.   Blake Divine, MD 02/21/19 1014

## 2019-02-21 NOTE — ED Notes (Signed)
Family at bedside. 

## 2019-02-21 NOTE — Progress Notes (Signed)
[facility]  ROUNDING NOTE   Subjective:  Late entry. Patient seen prior to discharge from the emergency department. Completed dialysis treatment yesterday. Still having weakness in her lower extremities as well as pain in her right lower extremity.   Objective:  Vital signs in last 24 hours:  Temp:  [97.6 F (36.4 C)] 97.6 F (36.4 C) (11/23 1715) Pulse Rate:  [89-113] 100 (11/24 1030) Resp:  [10-23] 20 (11/23 1803) BP: (108-166)/(48-91) 159/66 (11/24 1030) SpO2:  [94 %-100 %] 95 % (11/24 1030) Weight:  [55.9 kg] 55.9 kg (11/23 1325)  Weight change:  Filed Weights   02/17/19 1252 02/20/19 1325  Weight: 52.6 kg 55.9 kg    Intake/Output: I/O last 3 completed shifts: In: -  Out: 1100 [Other:1100]   Intake/Output this shift:  No intake/output data recorded.  Physical Exam: General: No acute distress  Head: Normocephalic, atraumatic. Moist oral mucosal membranes  Eyes: Anicteric  Neck: Supple, trachea midline  Lungs:  Clear to auscultation, normal effort  Heart: S1S2 no rubs  Abdomen:  Soft, nontender, bowel sounds present  Extremities: No peripheral edema.  Neurologic: Awake, alert, following commands  Skin: No lesions  Access: LUE AVF    Basic Metabolic Panel: Recent Labs  Lab 02/17/19 1722  NA 142  K 3.2*  CL 98  CO2 27  GLUCOSE 81  BUN 14  CREATININE 4.36*  CALCIUM 9.4    Liver Function Tests: Recent Labs  Lab 02/17/19 1722  AST 32  ALT 15  ALKPHOS 97  BILITOT 1.3*  PROT 6.4*  ALBUMIN 3.2*   No results for input(s): LIPASE, AMYLASE in the last 168 hours. No results for input(s): AMMONIA in the last 168 hours.  CBC: Recent Labs  Lab 02/17/19 1722  WBC 11.1*  NEUTROABS 9.1*  HGB 10.2*  HCT 28.8*  MCV 100.3*  PLT 195    Cardiac Enzymes: No results for input(s): CKTOTAL, CKMB, CKMBINDEX, TROPONINI in the last 168 hours.  BNP: Invalid input(s): POCBNP  CBG: No results for input(s): GLUCAP in the last 168  hours.  Microbiology: Results for orders placed or performed during the hospital encounter of 02/17/19  SARS CORONAVIRUS 2 (TAT 6-24 HRS) Nasopharyngeal Nasopharyngeal Swab     Status: None   Collection Time: 02/17/19  5:22 PM   Specimen: Nasopharyngeal Swab  Result Value Ref Range Status   SARS Coronavirus 2 NEGATIVE NEGATIVE Final    Comment: (NOTE) SARS-CoV-2 target nucleic acids are NOT DETECTED. The SARS-CoV-2 RNA is generally detectable in upper and lower respiratory specimens during the acute phase of infection. Negative results do not preclude SARS-CoV-2 infection, do not rule out co-infections with other pathogens, and should not be used as the sole basis for treatment or other patient management decisions. Negative results must be combined with clinical observations, patient history, and epidemiological information. The expected result is Negative. Fact Sheet for Patients: SugarRoll.be Fact Sheet for Healthcare Providers: https://www.woods-mathews.com/ This test is not yet approved or cleared by the [place] FDA and  has been authorized for detection and/or diagnosis of SARS-CoV-2 by FDA under an Emergency Use Authorization (EUA). This EUA will remain  in effect (meaning this test can be used) for the duration of the COVID-19 declaration under Section 56 4(b)(1) of the Act, 21 U.S.C. section 360bbb-3(b)(1), unless the authorization is terminated or revoked sooner. Performed at [facility], [place] [address], [place], [place] [address]     Coagulation Studies: No results for input(s): LABPROT, INR in  the last 72 hours.  Urinalysis: No results for input(s): COLORURINE, LABSPEC, PHURINE, GLUCOSEU, HGBUR, BILIRUBINUR, KETONESUR, PROTEINUR, UROBILINOGEN, NITRITE, LEUKOCYTESUR in the last 72 hours.  Invalid input(s): APPERANCEUR    Imaging: No results found.   Medications:    . amLODipine  5 mg Oral Daily  .  atorvastatin  40 mg Oral q1800  . Chlorhexidine Gluconate Cloth  6 each Topical Q0600  . ciprofloxacin  250 mg Oral q1800  . hydrALAZINE  25 mg Oral q1800  . levothyroxine  100 mcg Oral Once per day on Sun Mon Tue Wed Thu Fri  . [START ON 02/25/2019] levothyroxine  50 mcg Oral Q Sat  . metoprolol tartrate  25 mg Oral Daily  . multivitamin  1 tablet Oral q1800  . pantoprazole  40 mg Oral Daily  . prednisoLONE acetate  1 drop Left Eye See admin instructions  . sevelamer carbonate  1,600 mg Oral TID WC  . valACYclovir  500 mg Oral Daily   acetaminophen, docusate sodium  Assessment/ Plan:  68 y.o. female with COPD, hypertension, hyperlipidemia, vaginal intraepithelial neoplasia, emergent AAA repair status post rupture on February 15, 2015,  left to right femoral to femoral bypass , acute renal failure leading to permanent dialysis, significant 3 vessel disease, HIT positive, thrombocytopenia admitted now status post fall.  1.  Hypertension. 2.  Anemia of CKD. 3. Secondary hyperparathyroidism. 4.  S/p Fall.  Plan:  Patient underwent hemodialysis yesterday and tolerated well.  No acute indication for dialysis today.  Outpatient dialysis schedule has been adjusted this week at her outpatient dialysis center and she will undergo dialysis again tomorrow.  She will need continued monitoring of CBC as well as bone metabolism parameters.  She is being discharged to [facility] for strengthening of her lower extremities status post fall.   LOS: 0 [name] 11/24/202011:18 AM

## 2019-02-21 NOTE — ED Notes (Signed)
Pt verbalizes d/c understanding to [facility]. PT in NAD, VS. PT unable to sign discharge due to signature pad malfnx

## 2019-02-21 NOTE — Social Work (Addendum)
Patient will be able to go to [facility].  Room: 714  Number for report:  [phone]   EDP notified  RN notified   CSW waiting to fax AVS to [phone].   10:15am - CSW faxed AVS to [facility].   ED secretary notified of transport.   [place], [place] [facility]  [phone]

## 2019-03-07 ENCOUNTER — Other Ambulatory Visit: Payer: Self-pay

## 2019-03-07 ENCOUNTER — Emergency Department: Payer: Medicare Other

## 2019-03-07 ENCOUNTER — Emergency Department
Admission: EM | Admit: 2019-03-07 | Discharge: 2019-03-07 | Disposition: A | Discharge status: Home / Self Care | Payer: Medicare Other | Attending: Emergency Medicine | Admitting: Emergency Medicine

## 2019-03-07 DIAGNOSIS — Z7982 Long term (current) use of aspirin: Secondary | ICD-10-CM | POA: Diagnosis not present

## 2019-03-07 DIAGNOSIS — M5416 Radiculopathy, lumbar region: Secondary | ICD-10-CM | POA: Diagnosis not present

## 2019-03-07 DIAGNOSIS — I132 Hypertensive heart and chronic kidney disease with heart failure and with stage 5 chronic kidney disease, or end stage renal disease: Secondary | ICD-10-CM | POA: Diagnosis not present

## 2019-03-07 DIAGNOSIS — Y939 Activity, unspecified: Secondary | ICD-10-CM | POA: Diagnosis not present

## 2019-03-07 DIAGNOSIS — Y929 Unspecified place or not applicable: Secondary | ICD-10-CM | POA: Insufficient documentation

## 2019-03-07 DIAGNOSIS — S3210XA Unspecified fracture of sacrum, initial encounter for closed fracture: Secondary | ICD-10-CM | POA: Insufficient documentation

## 2019-03-07 DIAGNOSIS — J449 Chronic obstructive pulmonary disease, unspecified: Secondary | ICD-10-CM | POA: Insufficient documentation

## 2019-03-07 DIAGNOSIS — W19XXXA Unspecified fall, initial encounter: Secondary | ICD-10-CM | POA: Diagnosis not present

## 2019-03-07 DIAGNOSIS — E039 Hypothyroidism, unspecified: Secondary | ICD-10-CM | POA: Insufficient documentation

## 2019-03-07 DIAGNOSIS — Z992 Dependence on renal dialysis: Secondary | ICD-10-CM | POA: Insufficient documentation

## 2019-03-07 DIAGNOSIS — Z87891 Personal history of nicotine dependence: Secondary | ICD-10-CM | POA: Diagnosis not present

## 2019-03-07 DIAGNOSIS — Y999 Unspecified external cause status: Secondary | ICD-10-CM | POA: Insufficient documentation

## 2019-03-07 DIAGNOSIS — Z79899 Other long term (current) drug therapy: Secondary | ICD-10-CM | POA: Diagnosis not present

## 2019-03-07 DIAGNOSIS — S3992XA Unspecified injury of lower back, initial encounter: Secondary | ICD-10-CM | POA: Diagnosis present

## 2019-03-07 DIAGNOSIS — N186 End stage renal disease: Secondary | ICD-10-CM | POA: Diagnosis not present

## 2019-03-07 DIAGNOSIS — I5032 Chronic diastolic (congestive) heart failure: Secondary | ICD-10-CM | POA: Insufficient documentation

## 2019-03-07 LAB — CBC WITH DIFFERENTIAL/PLATELET
Abs Immature Granulocytes: 0.05 10*3/uL (ref 0.00–0.07)
Basophils Absolute: 0.1 10*3/uL (ref 0.0–0.1)
Basophils Relative: 1 %
Eosinophils Absolute: 0.3 10*3/uL (ref 0.0–0.5)
Eosinophils Relative: 2 %
HCT: 30.6 % — ABNORMAL LOW (ref 36.0–46.0)
Hemoglobin: 10.4 g/dL — ABNORMAL LOW (ref 12.0–15.0)
Immature Granulocytes: 0 %
Lymphocytes Relative: 9 %
Lymphs Abs: 1 10*3/uL (ref 0.7–4.0)
MCH: 35.6 pg — ABNORMAL HIGH (ref 26.0–34.0)
MCHC: 34 g/dL (ref 30.0–36.0)
MCV: 104.8 fL — ABNORMAL HIGH (ref 80.0–100.0)
Monocytes Absolute: 0.8 10*3/uL (ref 0.1–1.0)
Monocytes Relative: 7 %
Neutro Abs: 9.4 10*3/uL — ABNORMAL HIGH (ref 1.7–7.7)
Neutrophils Relative %: 81 %
Platelets: 336 10*3/uL (ref 150–400)
RBC: 2.92 MIL/uL — ABNORMAL LOW (ref 3.87–5.11)
RDW: 16 % — ABNORMAL HIGH (ref 11.5–15.5)
WBC: 11.6 10*3/uL — ABNORMAL HIGH (ref 4.0–10.5)
nRBC: 0 % (ref 0.0–0.2)

## 2019-03-07 LAB — BASIC METABOLIC PANEL
Anion gap: 16 — ABNORMAL HIGH (ref 5–15)
BUN: 38 mg/dL — ABNORMAL HIGH (ref 8–23)
CO2: 24 mmol/L (ref 22–32)
Calcium: 9.8 mg/dL (ref 8.9–10.3)
Chloride: 96 mmol/L — ABNORMAL LOW (ref 98–111)
Creatinine, Ser: 6.87 mg/dL — ABNORMAL HIGH (ref 0.44–1.00)
GFR calc Af Amer: 6 mL/min — ABNORMAL LOW (ref >60)
GFR calc non Af Amer: 6 mL/min — ABNORMAL LOW (ref >60)
Glucose, Bld: 82 mg/dL (ref 70–99)
Potassium: 3.9 mmol/L (ref 3.5–5.1)
Sodium: 136 mmol/L (ref 135–145)

## 2019-03-07 MED ORDER — LIDOCAINE 5 % EX PTCH: 1.0000 | MEDICATED_PATCH | Freq: Two times a day (BID) | CUTANEOUS | 0 refills | Status: AC

## 2019-03-07 MED ORDER — MORPHINE SULFATE (PF) 4 MG/ML IV SOLN
4.0000 mg | Freq: Once | INTRAVENOUS | Status: AC
  Administered 2019-03-07: 4 mg via INTRAVENOUS
  Filled 2019-03-07: qty 1

## 2019-03-07 MED ORDER — CYCLOBENZAPRINE HCL 10 MG PO TABS
5.0000 mg | ORAL_TABLET | Freq: Once | ORAL | Status: AC
  Administered 2019-03-07: 5 mg via ORAL
  Filled 2019-03-07: qty 1

## 2019-03-07 MED ORDER — LIDOCAINE 5 % EX PTCH
1.0000 | MEDICATED_PATCH | CUTANEOUS | Status: DC
  Administered 2019-03-07: 1 via TRANSDERMAL
  Filled 2019-03-07: qty 1

## 2019-03-07 MED ORDER — CYCLOBENZAPRINE HCL 5 MG PO TABS: 5.0000 mg | ORAL_TABLET | Freq: Three times a day (TID) | ORAL | 0 refills | Status: DC | PRN

## 2019-03-07 NOTE — ED Notes (Signed)
Patient transported to MRI 
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
## 2019-03-07 NOTE — ED Triage Notes (Signed)
Pt arrives via ems from [facility]. Ems reports pt c/o pain in coccyx region, fell 2 weeks ago and seen here. Ems reports nothing was broken. Pt was supposed to go to dialysis today but refused due to pain when sitting up in wheel chair. Pt has already taken one scheduled percocet, and an additional percocet and flexeril. Pt a&o x 4 on arrival NAD noted at this time

## 2019-03-07 NOTE — ED Notes (Signed)
Ems arrived to transport pt back to [facility]

## 2019-03-17 ENCOUNTER — Other Ambulatory Visit
Admission: RE | Admit: 2019-03-17 | Discharge: 2019-03-17 | Disposition: A | Discharge status: Home / Self Care | Payer: Medicare Other | Source: Ambulatory Visit | Attending: Orthopedic Surgery | Admitting: Orthopedic Surgery

## 2019-03-17 ENCOUNTER — Other Ambulatory Visit: Payer: Self-pay | Admitting: Orthopedic Surgery

## 2019-03-17 DIAGNOSIS — Z01812 Encounter for preprocedural laboratory examination: Secondary | ICD-10-CM | POA: Insufficient documentation

## 2019-03-17 DIAGNOSIS — Z20828 Contact with and (suspected) exposure to other viral communicable diseases: Secondary | ICD-10-CM | POA: Insufficient documentation

## 2019-03-17 LAB — SARS CORONAVIRUS 2 (TAT 6-24 HRS): SARS Coronavirus 2: NEGATIVE

## 2019-03-21 ENCOUNTER — Ambulatory Visit: Payer: No Typology Code available for payment source

## 2019-03-21 ENCOUNTER — Other Ambulatory Visit: Payer: Self-pay

## 2019-03-21 ENCOUNTER — Encounter: Payer: Self-pay | Admitting: Orthopedic Surgery

## 2019-03-21 ENCOUNTER — Encounter
Admission: RE | Disposition: A | Discharge status: Home / Self Care | Payer: Self-pay | Source: Home / Self Care | Attending: Orthopedic Surgery

## 2019-03-21 ENCOUNTER — Ambulatory Visit: Payer: No Typology Code available for payment source | Admitting: Certified Registered Nurse Anesthetist

## 2019-03-21 ENCOUNTER — Ambulatory Visit
Admission: RE | Admit: 2019-03-21 | Discharge: 2019-03-21 | Disposition: A | Discharge status: Home / Self Care | Payer: No Typology Code available for payment source | Attending: Orthopedic Surgery | Admitting: Orthopedic Surgery

## 2019-03-21 DIAGNOSIS — Z87891 Personal history of nicotine dependence: Secondary | ICD-10-CM | POA: Insufficient documentation

## 2019-03-21 DIAGNOSIS — I132 Hypertensive heart and chronic kidney disease with heart failure and with stage 5 chronic kidney disease, or end stage renal disease: Secondary | ICD-10-CM | POA: Diagnosis not present

## 2019-03-21 DIAGNOSIS — J449 Chronic obstructive pulmonary disease, unspecified: Secondary | ICD-10-CM | POA: Insufficient documentation

## 2019-03-21 DIAGNOSIS — Z9981 Dependence on supplemental oxygen: Secondary | ICD-10-CM | POA: Insufficient documentation

## 2019-03-21 DIAGNOSIS — Z7982 Long term (current) use of aspirin: Secondary | ICD-10-CM | POA: Insufficient documentation

## 2019-03-21 DIAGNOSIS — W050XXA Fall from non-moving wheelchair, initial encounter: Secondary | ICD-10-CM | POA: Insufficient documentation

## 2019-03-21 DIAGNOSIS — Z9049 Acquired absence of other specified parts of digestive tract: Secondary | ICD-10-CM | POA: Insufficient documentation

## 2019-03-21 DIAGNOSIS — I739 Peripheral vascular disease, unspecified: Secondary | ICD-10-CM | POA: Diagnosis not present

## 2019-03-21 DIAGNOSIS — I5030 Unspecified diastolic (congestive) heart failure: Secondary | ICD-10-CM | POA: Insufficient documentation

## 2019-03-21 DIAGNOSIS — N186 End stage renal disease: Secondary | ICD-10-CM | POA: Diagnosis not present

## 2019-03-21 DIAGNOSIS — S3210XA Unspecified fracture of sacrum, initial encounter for closed fracture: Secondary | ICD-10-CM | POA: Insufficient documentation

## 2019-03-21 DIAGNOSIS — Z992 Dependence on renal dialysis: Secondary | ICD-10-CM | POA: Diagnosis not present

## 2019-03-21 DIAGNOSIS — I2510 Atherosclerotic heart disease of native coronary artery without angina pectoris: Secondary | ICD-10-CM | POA: Insufficient documentation

## 2019-03-21 DIAGNOSIS — Z419 Encounter for procedure for purposes other than remedying health state, unspecified: Secondary | ICD-10-CM

## 2019-03-21 HISTORY — PX: SACROPLASTY: SHX6797

## 2019-03-21 LAB — POCT I-STAT, CHEM 8
BUN: 27 mg/dL — ABNORMAL HIGH (ref 8–23)
BUN: 30 mg/dL — ABNORMAL HIGH (ref 8–23)
Calcium, Ion: 1.2 mmol/L (ref 1.15–1.40)
Calcium, Ion: 1.2 mmol/L (ref 1.15–1.40)
Chloride: 98 mmol/L (ref 98–111)
Chloride: 99 mmol/L (ref 98–111)
Creatinine, Ser: 3.9 mg/dL — ABNORMAL HIGH (ref 0.44–1.00)
Creatinine, Ser: 4.2 mg/dL — ABNORMAL HIGH (ref 0.44–1.00)
Glucose, Bld: 72 mg/dL (ref 70–99)
Glucose, Bld: 68 mg/dL — ABNORMAL LOW (ref 70–99)
HCT: 28 % — ABNORMAL LOW (ref 36.0–46.0)
HCT: 26 % — ABNORMAL LOW (ref 36.0–46.0)
Hemoglobin: 9.5 g/dL — ABNORMAL LOW (ref 12.0–15.0)
Hemoglobin: 8.8 g/dL — ABNORMAL LOW (ref 12.0–15.0)
Potassium: 2.8 mmol/L — ABNORMAL LOW (ref 3.5–5.1)
Potassium: 3.3 mmol/L — ABNORMAL LOW (ref 3.5–5.1)
Sodium: 138 mmol/L (ref 135–145)
Sodium: 137 mmol/L (ref 135–145)
TCO2: 29 mmol/L (ref 22–32)
TCO2: 30 mmol/L (ref 22–32)

## 2019-03-21 SURGERY — SACROPLASTY
Anesthesia: General

## 2019-03-21 MED ORDER — MIDAZOLAM HCL 2 MG/2ML IJ SOLN
INTRAMUSCULAR | Status: AC
  Filled 2019-03-21: qty 2

## 2019-03-21 MED ORDER — ONDANSETRON HCL 4 MG/2ML IJ SOLN
INTRAMUSCULAR | Status: DC | PRN
  Administered 2019-03-21: 4 mg via INTRAVENOUS

## 2019-03-21 MED ORDER — SODIUM CHLORIDE 0.9 % IV SOLN: INTRAVENOUS | Status: DC

## 2019-03-21 MED ORDER — MIDAZOLAM HCL 2 MG/2ML IJ SOLN
INTRAMUSCULAR | Status: DC | PRN
  Administered 2019-03-21 (×2): 1 mg via INTRAVENOUS

## 2019-03-21 MED ORDER — POTASSIUM CHLORIDE CRYS ER 20 MEQ PO TBCR
EXTENDED_RELEASE_TABLET | ORAL | Status: AC
  Filled 2019-03-21: qty 2

## 2019-03-21 MED ORDER — CHLORHEXIDINE GLUCONATE 4 % EX LIQD: 60.0000 mL | Freq: Once | CUTANEOUS | Status: DC

## 2019-03-21 MED ORDER — POTASSIUM CHLORIDE CRYS ER 20 MEQ PO TBCR: 20.0000 meq | EXTENDED_RELEASE_TABLET | Freq: Once | ORAL | Status: DC

## 2019-03-21 MED ORDER — POTASSIUM CHLORIDE CRYS ER 20 MEQ PO TBCR
40.0000 meq | EXTENDED_RELEASE_TABLET | Freq: Once | ORAL | Status: AC
  Administered 2019-03-21: 40 meq via ORAL

## 2019-03-21 MED ORDER — PROPOFOL 10 MG/ML IV BOLUS
INTRAVENOUS | Status: DC | PRN
  Administered 2019-03-21: 15 ug/kg/min via INTRAVENOUS

## 2019-03-21 MED ORDER — BUPIVACAINE-EPINEPHRINE (PF) 0.5% -1:200000 IJ SOLN
INTRAMUSCULAR | Status: DC | PRN
  Administered 2019-03-21: 30 mL

## 2019-03-21 MED ORDER — FENTANYL CITRATE (PF) 100 MCG/2ML IJ SOLN
INTRAMUSCULAR | Status: AC
  Filled 2019-03-21: qty 2

## 2019-03-21 MED ORDER — CEFAZOLIN SODIUM-DEXTROSE 2-4 GM/100ML-% IV SOLN
2.0000 g | INTRAVENOUS | Status: AC
  Administered 2019-03-21: 14:00:00 2 g via INTRAVENOUS

## 2019-03-21 MED ORDER — LIDOCAINE HCL (PF) 1 % IJ SOLN
INTRAMUSCULAR | Status: DC | PRN
  Administered 2019-03-21: 20 mL

## 2019-03-21 MED ORDER — FENTANYL CITRATE (PF) 100 MCG/2ML IJ SOLN
INTRAMUSCULAR | Status: DC | PRN
  Administered 2019-03-21: 12.5 ug via INTRAVENOUS
  Administered 2019-03-21: 25 ug via INTRAVENOUS
  Administered 2019-03-21: 12.5 ug via INTRAVENOUS

## 2019-03-21 MED ORDER — PHENYLEPHRINE HCL (PRESSORS) 10 MG/ML IV SOLN
INTRAVENOUS | Status: DC | PRN
  Administered 2019-03-21: 100 ug via INTRAVENOUS

## 2019-03-21 MED ORDER — CEFAZOLIN SODIUM-DEXTROSE 2-4 GM/100ML-% IV SOLN
INTRAVENOUS | Status: AC
  Filled 2019-03-21: qty 100

## 2019-03-21 MED ORDER — POTASSIUM CHLORIDE 10 MEQ/100ML IV SOLN
10.0000 meq | INTRAVENOUS | Status: AC
  Administered 2019-03-21 (×2): 10 meq via INTRAVENOUS
  Filled 2019-03-21 (×2): qty 100

## 2019-03-21 SURGICAL SUPPLY — 20 items
ADH SKN CLS APL DERMABOND .7 (GAUZE/BANDAGES/DRESSINGS) ×1
CEMENT KYPHON CX01A KIT/MIXER (Cement) ×2 IMPLANT
COVER WAND RF STERILE (DRAPES) ×3 IMPLANT
DERMABOND ADVANCED (GAUZE/BANDAGES/DRESSINGS) ×2
DERMABOND ADVANCED .7 DNX12 (GAUZE/BANDAGES/DRESSINGS) ×1 IMPLANT
DRAPE C-ARM XRAY 36X54 (DRAPES) ×3 IMPLANT
DRSG AQUACEL AG ADV 3.5X 4 (GAUZE/BANDAGES/DRESSINGS) ×2 IMPLANT
DURAPREP 26ML APPLICATOR (WOUND CARE) ×3 IMPLANT
GLOVE SURG SYN 9.0  PF PI (GLOVE) ×2
GLOVE SURG SYN 9.0 PF PI (GLOVE) ×1 IMPLANT
GOWN SRG 2XL LVL 4 RGLN SLV (GOWNS) ×1 IMPLANT
GOWN STRL NON-REIN 2XL LVL4 (GOWNS) ×3
GOWN STRL REUS W/ TWL LRG LVL3 (GOWN DISPOSABLE) ×1 IMPLANT
GOWN STRL REUS W/TWL LRG LVL3 (GOWN DISPOSABLE) ×3
KIT OSTEOCOOL BONE ACCESS 10G (MISCELLANEOUS) ×4 IMPLANT
KIT TURNOVER KIT A (KITS) ×3 IMPLANT
PACK KYPHOPLASTY (MISCELLANEOUS) ×3 IMPLANT
SYS CARTRIDGE BONE CEMENT 8ML (SYSTAGENIX WOUND MANAGEMENT) ×3
SYSTEM CARTRIDG BONE CEMNT 8ML (SYSTAGENIX WOUND MANAGEMENT) ×1 IMPLANT
SYSTEM GUN BONE FILLER SZ2 (MISCELLANEOUS) ×3 IMPLANT
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
## 2019-03-21 NOTE — H&P (Signed)
Reviewed paper H+P, will be scanned into chart. No changes noted.  

## 2019-03-21 NOTE — Op Note (Signed)
03/21/2019  3:08 PM  PATIENT:  [name]  68 y.o. female  PRE-OPERATIVE DIAGNOSIS:  SACRAL INSUFFIECIENCY FRACTURE  POST-OPERATIVE DIAGNOSIS:  SACRAL INSUFFIECIENCY FRACTURE  PROCEDURE:  Procedure(s): SACROPLASTY (N/A)  SURGEON: Laurene Footman, MD  ASSISTANTS: None  ANESTHESIA:   local and MAC  EBL:  Total I/O In: 200 [I.V.:200] Out: -   BLOOD ADMINISTERED:none  DRAINS: none   LOCAL MEDICATIONS USED:  MARCAINE    and LIDOCAINE   SPECIMEN:  No Specimen  DISPOSITION OF SPECIMEN:  N/A  COUNTS:  YES  TOURNIQUET:  * No tourniquets in log *  IMPLANTS: Bone cement  DICTATION: .Dragon Dictation patient was brought to the operating room and after adequate sedation was given the patient was placed prone.  C arm was brought in and good visualization of the sacrum was obtained in both AP and lateral projections.  After patient identification and timeout procedures were completed 10 cc of 1% Xylocaine was infiltrated the subcutaneous tissue in the area of the planned incisions for for initial local anesthetic.  After prepping and draping in the usual sterile fashion appropriate patient identification and timeout procedure were completed again spinal needle was placed down to the sacrum in the appropriate position on both AP and lateral projections aiming between the sacral foramina and the SI joint with oblique views of the with the C arm on the anterior posterior view showing this well.  20 cc of 1/2% Sensorcaine with epi and 1% Xylocaine was infiltrated in the down to the bone in both sides.  After allowing this to set a small incision was made in trocar advanced into that same plane in the midportion on the lateral between the sacral foramina and SI joint.  Drilling was carried out and the cement was mixed approximately 2-1/2 to 3 cc was infiltrated on each side getting into S1 and S2.  There did not appear to be any extravasation to the sacral foramina or into the SI joint on  oblique views.  After the cement was set both trochars were removed without extravasation being evident and permanent C-arm views obtained.  Dermabond was used to close the skin incisions and Band-Aids applied there was approximately a 3 x 3 cm early decubitus ulcer just below the area of the incisions and this was covered with an Aquacel.  This was present prior to the start of the case.  PLAN OF CARE: Discharge to home after PACU  PATIENT DISPOSITION:  PACU - hemodynamically stable.

## 2019-03-21 NOTE — Discharge Instructions (Addendum)
Take it easy today and tomorrow.  Thursday take off Band-Aid then okay to shower.  Try not to lift anything over 5 pounds for the next 2 weeks.  Call office if you are having increased pain.  Pain medicine as directed.  Leave the larger bandage over the decubitus on the buttock for 1 week if possible.  AMBULATORY SURGERY  DISCHARGE INSTRUCTIONS   1) The drugs that you were given will stay in your system until tomorrow so for the next 24 hours you should not:  A) Drive an automobile B) Make any legal decisions C) Drink any alcoholic beverage   2) You may resume regular meals tomorrow.  Today it is better to start with liquids and gradually work up to solid foods.  You may eat anything you prefer, but it is better to start with liquids, then soup and crackers, and gradually work up to solid foods.   3) Please notify your doctor immediately if you have any unusual bleeding, trouble breathing, redness and pain at the surgery site, drainage, fever, or pain not relieved by medication.    4) Additional Instructions:  PER DR MENZ LAY ON SIDE AND OFF OF BACK/BUTTOCKS DUE TO SORE. LAY ON SIDES.    Please contact your physician with any problems or Same Day Surgery at [phone], Monday through Friday 6 am to 4 pm, or [place] at [facility] number at [phone].

## 2019-03-21 NOTE — OR Nursing (Signed)
This RN spoke with Birdie Hopes, RN from [facility] where pt resides. States pt last dose of metoprolol was last night (12/21)

## 2019-03-21 NOTE — Transfer of Care (Signed)
Immediate Anesthesia Transfer of Care Note  Patient: [name]  Procedure(s) Performed: SACROPLASTY (N/A )  Patient Location: PACU  Anesthesia Type:General  Level of Consciousness: sedated  Airway & Oxygen Therapy: Patient Spontanous Breathing and Patient connected to nasal cannula oxygen  Post-op Assessment: Report given to RN and Post -op Vital signs reviewed and stable  Post vital signs: Reviewed and stable  Last Vitals:  Vitals Value Taken Time  BP 103/48 03/21/19 1512  Temp 36.1 C 03/21/19 1512  Pulse    Resp 19 03/21/19 1516  SpO2 100 % 03/21/19 1512  Vitals shown include unvalidated device data.  Last Pain:  Vitals:   03/21/19 1512  TempSrc:   PainSc: Asleep         Complications: No apparent anesthesia complications

## 2019-03-21 NOTE — Anesthesia Procedure Notes (Signed)
Date/Time: 03/21/2019 2:35 PM Performed by: Nelda Marseille, CRNA Pre-anesthesia Checklist: Patient identified, Emergency Drugs available, Suction available and Patient being monitored Patient Re-evaluated:Patient Re-evaluated prior to induction Oxygen Delivery Method: Nasal cannula

## 2019-03-21 NOTE — OR Nursing (Signed)
Istat potassium result was 2.8. Dr. Rosey Bath from anesthesia notified. Oral potassium given and IV potassium has been started as ordered.

## 2019-03-21 NOTE — Anesthesia Post-op Follow-up Note (Signed)
Anesthesia QCDR form completed.        

## 2019-03-21 NOTE — OR Nursing (Signed)
Pt. Left with [facility] [employer] attendant. Pt. Awake and in NAD with VSS. IV site intact.

## 2019-03-25 NOTE — Anesthesia Postprocedure Evaluation (Signed)
Anesthesia Post Note  Patient: [name]  Procedure(s) Performed: SACROPLASTY (N/A )  Patient location during evaluation: PACU Anesthesia Type: General Level of consciousness: awake and alert Pain management: pain level controlled Vital Signs Assessment: post-procedure vital signs reviewed and stable Respiratory status: spontaneous breathing, nonlabored ventilation, respiratory function stable and patient connected to nasal cannula oxygen Cardiovascular status: blood pressure returned to baseline and stable Postop Assessment: no apparent nausea or vomiting Anesthetic complications: no     Last Vitals:  Vitals:   03/21/19 1715 03/21/19 1745  BP: (!) 121/51 125/60  Pulse: 86 85  Resp: 18 17  Temp: (!) 36.4 C 36.4 C  SpO2:      Last Pain:  Vitals:   03/21/19 1745  TempSrc:   PainSc: 0-No pain                 Martha Clan

## 2019-06-16 ENCOUNTER — Other Ambulatory Visit (INDEPENDENT_AMBULATORY_CARE_PROVIDER_SITE_OTHER): Payer: Self-pay | Admitting: Vascular Surgery

## 2019-06-16 DIAGNOSIS — I714 Abdominal aortic aneurysm, without rupture, unspecified: Secondary | ICD-10-CM

## 2019-06-16 DIAGNOSIS — IMO0001 Reserved for inherently not codable concepts without codable children: Secondary | ICD-10-CM

## 2019-06-16 DIAGNOSIS — T82330S Leakage of aortic (bifurcation) graft (replacement), sequela: Secondary | ICD-10-CM

## 2019-06-19 ENCOUNTER — Other Ambulatory Visit: Payer: Self-pay

## 2019-06-19 ENCOUNTER — Ambulatory Visit (INDEPENDENT_AMBULATORY_CARE_PROVIDER_SITE_OTHER): Payer: Medicare Other

## 2019-06-19 ENCOUNTER — Encounter (INDEPENDENT_AMBULATORY_CARE_PROVIDER_SITE_OTHER): Payer: Self-pay | Admitting: Vascular Surgery

## 2019-06-19 ENCOUNTER — Ambulatory Visit (INDEPENDENT_AMBULATORY_CARE_PROVIDER_SITE_OTHER): Payer: Medicare Other | Admitting: Vascular Surgery

## 2019-06-19 VITALS — BP 167/71 | HR 85 | Ht 64.0 in | Wt 103.0 lb

## 2019-06-19 DIAGNOSIS — I739 Peripheral vascular disease, unspecified: Secondary | ICD-10-CM

## 2019-06-19 DIAGNOSIS — IMO0001 Reserved for inherently not codable concepts without codable children: Secondary | ICD-10-CM

## 2019-06-19 DIAGNOSIS — I10 Essential (primary) hypertension: Secondary | ICD-10-CM

## 2019-06-19 DIAGNOSIS — T829XXS Unspecified complication of cardiac and vascular prosthetic device, implant and graft, sequela: Secondary | ICD-10-CM

## 2019-06-19 DIAGNOSIS — T82330S Leakage of aortic (bifurcation) graft (replacement), sequela: Secondary | ICD-10-CM

## 2019-06-19 DIAGNOSIS — Z992 Dependence on renal dialysis: Secondary | ICD-10-CM

## 2019-06-19 DIAGNOSIS — I714 Abdominal aortic aneurysm, without rupture, unspecified: Secondary | ICD-10-CM

## 2019-06-19 DIAGNOSIS — I713 Abdominal aortic aneurysm, ruptured, unspecified: Secondary | ICD-10-CM

## 2019-06-19 DIAGNOSIS — N186 End stage renal disease: Secondary | ICD-10-CM

## 2019-06-19 DIAGNOSIS — E785 Hyperlipidemia, unspecified: Secondary | ICD-10-CM
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
## 2019-06-20 ENCOUNTER — Encounter: Payer: Self-pay | Admitting: Certified Nurse Midwife

## 2019-10-18 ENCOUNTER — Emergency Department: Payer: Medicare Other

## 2019-10-18 ENCOUNTER — Other Ambulatory Visit: Payer: Self-pay

## 2019-10-18 ENCOUNTER — Inpatient Hospital Stay
Admission: EM | Admit: 2019-10-18 | Discharge: 2019-10-21 | DRG: 757 | Disposition: A | Discharge status: Home Health | Payer: Medicare Other | Attending: Internal Medicine | Admitting: Internal Medicine

## 2019-10-18 DIAGNOSIS — N186 End stage renal disease: Secondary | ICD-10-CM | POA: Diagnosis present

## 2019-10-18 DIAGNOSIS — Z992 Dependence on renal dialysis: Secondary | ICD-10-CM

## 2019-10-18 DIAGNOSIS — N764 Abscess of vulva: Secondary | ICD-10-CM | POA: Diagnosis present

## 2019-10-18 DIAGNOSIS — M79605 Pain in left leg: Secondary | ICD-10-CM | POA: Diagnosis not present

## 2019-10-18 DIAGNOSIS — E785 Hyperlipidemia, unspecified: Secondary | ICD-10-CM | POA: Diagnosis present

## 2019-10-18 DIAGNOSIS — R52 Pain, unspecified: Secondary | ICD-10-CM

## 2019-10-18 DIAGNOSIS — Z993 Dependence on wheelchair: Secondary | ICD-10-CM

## 2019-10-18 DIAGNOSIS — D638 Anemia in other chronic diseases classified elsewhere: Secondary | ICD-10-CM | POA: Diagnosis present

## 2019-10-18 DIAGNOSIS — J449 Chronic obstructive pulmonary disease, unspecified: Secondary | ICD-10-CM | POA: Diagnosis present

## 2019-10-18 DIAGNOSIS — I132 Hypertensive heart and chronic kidney disease with heart failure and with stage 5 chronic kidney disease, or end stage renal disease: Secondary | ICD-10-CM | POA: Diagnosis present

## 2019-10-18 DIAGNOSIS — Z7982 Long term (current) use of aspirin: Secondary | ICD-10-CM

## 2019-10-18 DIAGNOSIS — R531 Weakness: Secondary | ICD-10-CM | POA: Diagnosis not present

## 2019-10-18 DIAGNOSIS — N2581 Secondary hyperparathyroidism of renal origin: Secondary | ICD-10-CM | POA: Diagnosis present

## 2019-10-18 DIAGNOSIS — E039 Hypothyroidism, unspecified: Secondary | ICD-10-CM | POA: Diagnosis present

## 2019-10-18 DIAGNOSIS — D631 Anemia in chronic kidney disease: Secondary | ICD-10-CM | POA: Diagnosis present

## 2019-10-18 DIAGNOSIS — I10 Essential (primary) hypertension: Secondary | ICD-10-CM | POA: Diagnosis present

## 2019-10-18 DIAGNOSIS — Z79899 Other long term (current) drug therapy: Secondary | ICD-10-CM

## 2019-10-18 DIAGNOSIS — K219 Gastro-esophageal reflux disease without esophagitis: Secondary | ICD-10-CM | POA: Diagnosis present

## 2019-10-18 DIAGNOSIS — Z90710 Acquired absence of both cervix and uterus: Secondary | ICD-10-CM

## 2019-10-18 DIAGNOSIS — I5032 Chronic diastolic (congestive) heart failure: Secondary | ICD-10-CM | POA: Diagnosis present

## 2019-10-18 DIAGNOSIS — M79604 Pain in right leg: Secondary | ICD-10-CM | POA: Diagnosis not present

## 2019-10-18 DIAGNOSIS — N762 Acute vulvitis: Secondary | ICD-10-CM | POA: Diagnosis present

## 2019-10-18 DIAGNOSIS — Z79890 Hormone replacement therapy (postmenopausal): Secondary | ICD-10-CM | POA: Diagnosis not present

## 2019-10-18 DIAGNOSIS — G629 Polyneuropathy, unspecified: Secondary | ICD-10-CM | POA: Diagnosis present

## 2019-10-18 DIAGNOSIS — Z8614 Personal history of Methicillin resistant Staphylococcus aureus infection: Secondary | ICD-10-CM

## 2019-10-18 DIAGNOSIS — Z87891 Personal history of nicotine dependence: Secondary | ICD-10-CM

## 2019-10-18 DIAGNOSIS — R102 Pelvic and perineal pain: Secondary | ICD-10-CM | POA: Diagnosis not present

## 2019-10-18 DIAGNOSIS — Z9981 Dependence on supplemental oxygen: Secondary | ICD-10-CM | POA: Diagnosis not present

## 2019-10-18 DIAGNOSIS — L0390 Cellulitis, unspecified: Secondary | ICD-10-CM

## 2019-10-18 DIAGNOSIS — J438 Other emphysema: Secondary | ICD-10-CM | POA: Diagnosis not present

## 2019-10-18 DIAGNOSIS — J9611 Chronic respiratory failure with hypoxia: Secondary | ICD-10-CM | POA: Diagnosis present

## 2019-10-18 DIAGNOSIS — N906 Unspecified hypertrophy of vulva: Secondary | ICD-10-CM | POA: Diagnosis not present

## 2019-10-18 DIAGNOSIS — Z20822 Contact with and (suspected) exposure to covid-19: Secondary | ICD-10-CM | POA: Diagnosis present

## 2019-10-18 DIAGNOSIS — J441 Chronic obstructive pulmonary disease with (acute) exacerbation: Secondary | ICD-10-CM | POA: Diagnosis present

## 2019-10-18 DIAGNOSIS — E038 Other specified hypothyroidism: Secondary | ICD-10-CM | POA: Diagnosis not present

## 2019-10-18 LAB — CBC WITH DIFFERENTIAL/PLATELET
Abs Immature Granulocytes: 0.11 10*3/uL — ABNORMAL HIGH (ref 0.00–0.07)
Basophils Absolute: 0.1 10*3/uL (ref 0.0–0.1)
Basophils Relative: 0 %
Eosinophils Absolute: 0.2 10*3/uL (ref 0.0–0.5)
Eosinophils Relative: 2 %
HCT: 29.7 % — ABNORMAL LOW (ref 36.0–46.0)
Hemoglobin: 10.2 g/dL — ABNORMAL LOW (ref 12.0–15.0)
Immature Granulocytes: 1 %
Lymphocytes Relative: 6 %
Lymphs Abs: 0.9 10*3/uL (ref 0.7–4.0)
MCH: 36.3 pg — ABNORMAL HIGH (ref 26.0–34.0)
MCHC: 34.3 g/dL (ref 30.0–36.0)
MCV: 105.7 fL — ABNORMAL HIGH (ref 80.0–100.0)
Monocytes Absolute: 1.4 10*3/uL — ABNORMAL HIGH (ref 0.1–1.0)
Monocytes Relative: 10 %
Neutro Abs: 12.1 10*3/uL — ABNORMAL HIGH (ref 1.7–7.7)
Neutrophils Relative %: 81 %
Platelets: 214 10*3/uL (ref 150–400)
RBC: 2.81 MIL/uL — ABNORMAL LOW (ref 3.87–5.11)
RDW: 14.2 % (ref 11.5–15.5)
WBC: 14.7 10*3/uL — ABNORMAL HIGH (ref 4.0–10.5)
nRBC: 0 % (ref 0.0–0.2)

## 2019-10-18 LAB — COMPREHENSIVE METABOLIC PANEL
ALT: 15 U/L (ref 0–44)
AST: 22 U/L (ref 15–41)
Albumin: 3.1 g/dL — ABNORMAL LOW (ref 3.5–5.0)
Alkaline Phosphatase: 143 U/L — ABNORMAL HIGH (ref 38–126)
Anion gap: 17 — ABNORMAL HIGH (ref 5–15)
BUN: 30 mg/dL — ABNORMAL HIGH (ref 8–23)
CO2: 28 mmol/L (ref 22–32)
Calcium: 9.1 mg/dL (ref 8.9–10.3)
Chloride: 96 mmol/L — ABNORMAL LOW (ref 98–111)
Creatinine, Ser: 4.87 mg/dL — ABNORMAL HIGH (ref 0.44–1.00)
GFR calc Af Amer: 10 mL/min — ABNORMAL LOW (ref >60)
GFR calc non Af Amer: 8 mL/min — ABNORMAL LOW (ref >60)
Glucose, Bld: 85 mg/dL (ref 70–99)
Potassium: 3.9 mmol/L (ref 3.5–5.1)
Sodium: 141 mmol/L (ref 135–145)
Total Bilirubin: 1.3 mg/dL — ABNORMAL HIGH (ref 0.3–1.2)
Total Protein: 6.5 g/dL (ref 6.5–8.1)

## 2019-10-18 LAB — APTT: aPTT: 36 seconds (ref 24–36)

## 2019-10-18 LAB — CBC
HCT: 34.6 % — ABNORMAL LOW (ref 36.0–46.0)
Hemoglobin: 11.8 g/dL — ABNORMAL LOW (ref 12.0–15.0)
MCH: 36.5 pg — ABNORMAL HIGH (ref 26.0–34.0)
MCHC: 34.1 g/dL (ref 30.0–36.0)
MCV: 107.1 fL — ABNORMAL HIGH (ref 80.0–100.0)
Platelets: 216 10*3/uL (ref 150–400)
RBC: 3.23 MIL/uL — ABNORMAL LOW (ref 3.87–5.11)
RDW: 14.5 % (ref 11.5–15.5)
WBC: 16 10*3/uL — ABNORMAL HIGH (ref 4.0–10.5)
nRBC: 0 % (ref 0.0–0.2)

## 2019-10-18 LAB — LACTIC ACID, PLASMA: Lactic Acid, Venous: 1.1 mmol/L (ref 0.5–1.9)

## 2019-10-18 LAB — PROTIME-INR
INR: 1.1 (ref 0.8–1.2)
Prothrombin Time: 13.8 seconds (ref 11.4–15.2)

## 2019-10-18 LAB — SARS CORONAVIRUS 2 BY RT PCR (HOSPITAL ORDER, PERFORMED IN ~~LOC~~ HOSPITAL LAB): SARS Coronavirus 2: NEGATIVE

## 2019-10-18 MED ORDER — LIDOCAINE 5 % EX PTCH
1.0000 | MEDICATED_PATCH | Freq: Two times a day (BID) | CUTANEOUS | Status: DC
  Administered 2019-10-19 – 2019-10-21 (×5): 1 via TRANSDERMAL
  Filled 2019-10-18 (×7): qty 1

## 2019-10-18 MED ORDER — SODIUM CHLORIDE 0.9 % IV SOLN
250.0000 mL | INTRAVENOUS | Status: DC | PRN
  Administered 2019-10-19: 250 mL via INTRAVENOUS

## 2019-10-18 MED ORDER — VANCOMYCIN HCL IN DEXTROSE 1-5 GM/200ML-% IV SOLN
1000.0000 mg | Freq: Once | INTRAVENOUS | Status: DC
  Filled 2019-10-18: qty 200

## 2019-10-18 MED ORDER — MORPHINE SULFATE (PF) 4 MG/ML IV SOLN
4.0000 mg | Freq: Once | INTRAVENOUS | Status: AC
  Administered 2019-10-18: 4 mg via INTRAVENOUS
  Filled 2019-10-18: qty 1

## 2019-10-18 MED ORDER — ONDANSETRON HCL 4 MG/2ML IJ SOLN
4.0000 mg | Freq: Once | INTRAMUSCULAR | Status: AC
  Administered 2019-10-18: 4 mg via INTRAVENOUS
  Filled 2019-10-18: qty 2

## 2019-10-18 MED ORDER — ONDANSETRON HCL 4 MG PO TABS: 4.0000 mg | ORAL_TABLET | Freq: Four times a day (QID) | ORAL | Status: DC | PRN

## 2019-10-18 MED ORDER — FLUTICASONE PROPIONATE 50 MCG/ACT NA SUSP
2.0000 | Freq: Every day | NASAL | Status: DC | PRN
  Filled 2019-10-18: qty 16

## 2019-10-18 MED ORDER — CYCLOBENZAPRINE HCL 10 MG PO TABS
5.0000 mg | ORAL_TABLET | Freq: Every day | ORAL | Status: DC
  Administered 2019-10-18 – 2019-10-20 (×3): 5 mg via ORAL
  Filled 2019-10-18 (×3): qty 1

## 2019-10-18 MED ORDER — CHLORHEXIDINE GLUCONATE CLOTH 2 % EX PADS
6.0000 | MEDICATED_PAD | Freq: Every day | CUTANEOUS | Status: DC
  Filled 2019-10-18: qty 6

## 2019-10-18 MED ORDER — SEVELAMER CARBONATE 800 MG PO TABS
800.0000 mg | ORAL_TABLET | Freq: Three times a day (TID) | ORAL | Status: DC
  Administered 2019-10-18 – 2019-10-21 (×7): 800 mg via ORAL
  Filled 2019-10-18 (×7): qty 1

## 2019-10-18 MED ORDER — HEPARIN SODIUM (PORCINE) 5000 UNIT/ML IJ SOLN: 5000.0000 [IU] | Freq: Three times a day (TID) | INTRAMUSCULAR | Status: DC

## 2019-10-18 MED ORDER — METOPROLOL TARTRATE 25 MG PO TABS
25.0000 mg | ORAL_TABLET | Freq: Every day | ORAL | Status: DC
  Administered 2019-10-18 – 2019-10-20 (×3): 25 mg via ORAL
  Filled 2019-10-18 (×3): qty 1

## 2019-10-18 MED ORDER — ACETAMINOPHEN 325 MG PO TABS
650.0000 mg | ORAL_TABLET | Freq: Four times a day (QID) | ORAL | Status: DC | PRN
  Administered 2019-10-19 – 2019-10-21 (×3): 650 mg via ORAL
  Filled 2019-10-18 (×3): qty 2

## 2019-10-18 MED ORDER — BIOTIN 5000 MCG PO CAPS: 5000.0000 ug | ORAL_CAPSULE | Freq: Every day | ORAL | Status: DC

## 2019-10-18 MED ORDER — VANCOMYCIN HCL IN DEXTROSE 1-5 GM/200ML-% IV SOLN
1000.0000 mg | Freq: Once | INTRAVENOUS | Status: AC
  Administered 2019-10-18: 1000 mg via INTRAVENOUS
  Filled 2019-10-18: qty 200

## 2019-10-18 MED ORDER — SODIUM CHLORIDE 0.9% FLUSH
3.0000 mL | Freq: Two times a day (BID) | INTRAVENOUS | Status: DC
  Administered 2019-10-18 – 2019-10-21 (×6): 3 mL via INTRAVENOUS

## 2019-10-18 MED ORDER — PRO-STAT SUGAR FREE PO LIQD
30.0000 mL | Freq: Two times a day (BID) | ORAL | Status: DC
  Administered 2019-10-19 – 2019-10-21 (×5): 30 mL via ORAL

## 2019-10-18 MED ORDER — SALINE SPRAY 0.65 % NA SOLN
1.0000 | Freq: Every day | NASAL | Status: DC | PRN
  Filled 2019-10-18: qty 44

## 2019-10-18 MED ORDER — SODIUM CHLORIDE 0.9% FLUSH: 3.0000 mL | INTRAVENOUS | Status: DC | PRN

## 2019-10-18 MED ORDER — ASPIRIN EC 325 MG PO TBEC
325.0000 mg | DELAYED_RELEASE_TABLET | Freq: Every day | ORAL | Status: DC
  Administered 2019-10-18 – 2019-10-21 (×4): 325 mg via ORAL
  Filled 2019-10-18 (×4): qty 1

## 2019-10-18 MED ORDER — NEPRO/CARBSTEADY PO LIQD
237.0000 mL | Freq: Every day | ORAL | Status: DC
  Administered 2019-10-20: 237 mL via ORAL

## 2019-10-18 MED ORDER — RENA-VITE PO TABS
1.0000 | ORAL_TABLET | Freq: Every day | ORAL | Status: DC
  Administered 2019-10-18 – 2019-10-21 (×4): 1 via ORAL
  Filled 2019-10-18 (×4): qty 1

## 2019-10-18 MED ORDER — ACETAMINOPHEN 650 MG RE SUPP: 650.0000 mg | Freq: Four times a day (QID) | RECTAL | Status: DC | PRN

## 2019-10-18 MED ORDER — DOCUSATE SODIUM 100 MG PO CAPS: 100.0000 mg | ORAL_CAPSULE | Freq: Every day | ORAL | Status: DC | PRN

## 2019-10-18 MED ORDER — GABAPENTIN 100 MG PO CAPS
100.0000 mg | ORAL_CAPSULE | Freq: Three times a day (TID) | ORAL | Status: DC
  Filled 2019-10-18 (×2): qty 1

## 2019-10-18 MED ORDER — PIPERACILLIN-TAZOBACTAM 3.375 G IVPB 30 MIN
3.3750 g | Freq: Once | INTRAVENOUS | Status: AC
  Administered 2019-10-18: 3.375 g via INTRAVENOUS
  Filled 2019-10-18: qty 50

## 2019-10-18 MED ORDER — VANCOMYCIN HCL 500 MG/100ML IV SOLN
500.0000 mg | INTRAVENOUS | Status: DC
  Administered 2019-10-19: 500 mg via INTRAVENOUS
  Filled 2019-10-18 (×2): qty 100

## 2019-10-18 MED ORDER — PANTOPRAZOLE SODIUM 40 MG PO TBEC
40.0000 mg | DELAYED_RELEASE_TABLET | Freq: Every day | ORAL | Status: DC
  Administered 2019-10-18 – 2019-10-21 (×4): 40 mg via ORAL
  Filled 2019-10-18 (×4): qty 1

## 2019-10-18 MED ORDER — ONDANSETRON HCL 4 MG/2ML IJ SOLN: 4.0000 mg | Freq: Four times a day (QID) | INTRAMUSCULAR | Status: DC | PRN

## 2019-10-18 MED ORDER — VALACYCLOVIR HCL 500 MG PO TABS
500.0000 mg | ORAL_TABLET | Freq: Every day | ORAL | Status: DC
  Administered 2019-10-18 – 2019-10-20 (×3): 500 mg via ORAL
  Filled 2019-10-18 (×4): qty 1

## 2019-10-18 MED ORDER — CEFAZOLIN SODIUM-DEXTROSE 1-4 GM/50ML-% IV SOLN
1.0000 g | Freq: Three times a day (TID) | INTRAVENOUS | Status: DC
  Filled 2019-10-18 (×4): qty 50

## 2019-10-18 MED ORDER — LEVOTHYROXINE SODIUM 100 MCG PO TABS
100.0000 ug | ORAL_TABLET | Freq: Every day | ORAL | Status: DC
  Administered 2019-10-19 – 2019-10-21 (×3): 100 ug via ORAL
  Filled 2019-10-18 (×3): qty 1

## 2019-10-18 MED ORDER — LIDOCAINE-PRILOCAINE 2.5-2.5 % EX CREA: 1.0000 "application " | TOPICAL_CREAM | CUTANEOUS | Status: DC | PRN

## 2019-10-18 MED ORDER — IOHEXOL 300 MG/ML  SOLN
75.0000 mL | Freq: Once | INTRAMUSCULAR | Status: AC | PRN
  Administered 2019-10-18: 75 mL via INTRAVENOUS
  Filled 2019-10-18: qty 75
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
## 2019-10-18 NOTE — ED Notes (Signed)
meds sent up with pt.
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
## 2019-10-18 NOTE — ED Notes (Addendum)
See triage note  Presents with pain to right groin area  Pt has a graft to that site   Area is swollen and tender

## 2019-10-18 NOTE — ED Notes (Signed)
Patient is resting comfortably. 

## 2019-10-18 NOTE — Progress Notes (Signed)
[facility]  ROUNDING NOTE   Subjective:   [name] [facility] admitted to [facility] on 10/18/2019 for Cellulitis of labia [N76.2]   Last hemodialysis treatment was 7/19.   Objective:  Vital signs in last 24 hours:  Temp:  [99.3 F (37.4 C)] 99.3 F (37.4 C) (07/21 0921) Pulse Rate:  [100-106] 100 (07/21 1353) Resp:  [18] 18 (07/21 1353) BP: (150-158)/(80-87) 150/80 (07/21 1353) SpO2:  [99 %] 99 % (07/21 1353) Weight:  [47.2 kg] 47.2 kg (07/21 0922)  Weight change:  Filed Weights   10/18/19 0922  Weight: 47.2 kg    Intake/Output: No intake/output data recorded.   Intake/Output this shift:  No intake/output data recorded.  Physical Exam: General: NAD,   Head: Normocephalic, atraumatic. Moist oral mucosal membranes  Eyes: Anicteric, PERRL  Neck: Supple, trachea midline  Lungs:  Clear to auscultation  Heart: Regular rate and rhythm  Abdomen:  Soft, nontender,   Extremities: no peripheral edema.  Neurologic: Nonfocal, moving all four extremities  Skin: No lesions  Access:  Left AVG    Basic Metabolic Panel: Recent Labs  Lab 10/18/19 1051  NA 141  K 3.9  CL 96*  CO2 28  GLUCOSE 85  BUN 30*  CREATININE 4.87*  CALCIUM 9.1    Liver Function Tests: Recent Labs  Lab 10/18/19 1051  AST 22  ALT 15  ALKPHOS 143*  BILITOT 1.3*  PROT 6.5  ALBUMIN 3.1*   No results for input(s): LIPASE, AMYLASE in the last 168 hours. No results for input(s): AMMONIA in the last 168 hours.  CBC: Recent Labs  Lab 10/18/19 1051  WBC 14.7*  NEUTROABS 12.1*  HGB 10.2*  HCT 29.7*  MCV 105.7*  PLT 214    Cardiac Enzymes: No results for input(s): CKTOTAL, CKMB, CKMBINDEX, TROPONINI in the last 168 hours.  BNP: Invalid input(s): POCBNP  CBG: No results for input(s): GLUCAP in the last 168 hours.  Microbiology: Results for orders placed or performed during the hospital encounter of 10/18/19  SARS Coronavirus 2 by RT PCR (hospital order, performed in  [facility] hospital lab) Nasopharyngeal Nasopharyngeal Swab     Status: None   Collection Time: 10/18/19 12:31 PM   Specimen: Nasopharyngeal Swab  Result Value Ref Range Status   SARS Coronavirus 2 NEGATIVE NEGATIVE Final    Comment: (NOTE) SARS-CoV-2 target nucleic acids are NOT DETECTED.  The SARS-CoV-2 RNA is generally detectable in upper and lower respiratory specimens during the acute phase of infection. The lowest concentration of SARS-CoV-2 viral copies this assay can detect is 250 copies / mL. A negative result does not preclude SARS-CoV-2 infection and should not be used as the sole basis for treatment or other patient management decisions.  A negative result may occur with improper specimen collection / handling, submission of specimen other than nasopharyngeal swab, presence of viral mutation(s) within the areas targeted by this assay, and inadequate number of viral copies (<250 copies / mL). A negative result must be combined with clinical observations, patient history, and epidemiological information.  Fact Sheet for Patients:   StrictlyIdeas.no  Fact Sheet for Healthcare Providers: BankingDealers.co.za  This test is not yet approved or  cleared by the [place] FDA and has been authorized for detection and/or diagnosis of SARS-CoV-2 by FDA under an Emergency Use Authorization (EUA).  This EUA will remain in effect (meaning this test can be used) for the duration of the COVID-19 declaration under Section 564(b)(1) of the Act, 21 U.S.C. section  360bbb-3(b)(1), unless the authorization is terminated or revoked sooner.  Performed at [facility], [address] [place], [place] [address]     Coagulation Studies: Recent Labs    10/18/19 1051  LABPROT 13.8  INR 1.1    Urinalysis: No results for input(s): COLORURINE, LABSPEC, PHURINE, GLUCOSEU, HGBUR, BILIRUBINUR, KETONESUR, PROTEINUR, UROBILINOGEN,  NITRITE, LEUKOCYTESUR in the last 72 hours.  Invalid input(s): APPERANCEUR    Imaging: US Pelvis Complete  Result Date: 10/18/2019 CLINICAL DATA:  Right groin and previous area swelling, AAA graft 06/18/2019 EXAM: US PELVIS COMPLETE TECHNIQUE: Ultrasound examination of the pelvic soft tissues was performed in the area of clinical concern. COMPARISON:  CT pelvis, 02/17/2019 FINDINGS: Targeted ultrasound examination of the pelvis at the patient identified area of the midline and right pubic area between the right groin and labia reveals a superficially open wound with extensive surrounding soft tissue edema and an ill-defined hypoechoic region deep to the skin in the soft tissues of the right pubic area measuring 6.7 x 3.6 x 1.8 cm. A midline vascular graft, presumably the femoral femoral bypass graft seen on prior comparison CT, demonstrates internal Doppler flow. IMPRESSION: 1. Targeted ultrasound examination of the pelvis at the patient identified area of the midline and right pubic area between the right groin and labia reveals a superficially open wound with extensive surrounding soft tissue edema and an ill-defined hypoechoic region deep to the skin in the soft tissues of the right pubic area measuring 6.7 x 3.6 x 1.8 cm. This is generally concerning for abscess in the setting of relatively recent prior surgery. Recommend contrast enhanced CT to further evaluate. 2. A vascular graft is identified in the midline pubic superficial soft tissues, presumably the femoral-femoral bypass graft seen on prior comparison CT, with internal Doppler flow. This is not fully evaluated for graft patency by ultrasound. Electronically Signed   By: Eddie Candle M.D.   On: 10/18/2019 11:41   CT ABDOMEN PELVIS W CONTRAST  Result Date: 10/18/2019 CLINICAL DATA:  Right groin pain. History of abdominal aortic aneurysm repair. Dialysis since 2016. EXAM: CT ABDOMEN AND PELVIS WITH CONTRAST TECHNIQUE: Multidetector CT imaging  of the abdomen and pelvis was performed using the standard protocol following bolus administration of intravenous contrast. CONTRAST:  56mL OMNIPAQUE IOHEXOL 300 MG/ML  SOLN COMPARISON:  05/21/2015 FINDINGS: Lower chest: Emphysema. Normal heart size without pericardial or pleural effusion. Multivessel coronary artery atherosclerosis. Hepatobiliary: Too small to characterize subcentimeter anterior right hepatic lobe low-density lesion is unchanged. 7 mm gallstone.  No acute cholecystitis or biliary duct dilatation. Pancreas: Normal, without mass or ductal dilatation. Spleen: Normal in size, without focal abnormality. Adrenals/Urinary Tract: Normal adrenal glands. Native renal artery atrophy, without hydronephrosis. Bladder wall thickening and surrounding edema, including on 69/2. Stomach/Bowel: Normal stomach, without wall thickening. Postsurgical changes within the sigmoid. Colonic stool burden suggests constipation. Normal terminal ileum. Surgical sutures in the region of the cecum. Normal small bowel. Vascular/Lymphatic: Advanced aortic and branch vessel atherosclerosis. Status post aortic stent graft repair, with significant decrease in size of native sac. Surgical changes about the proximal right common iliac artery with stent graft within. There is also a stent in the right external iliac artery. Status post fem-fem bypass, patent. No abdominopelvic adenopathy. Reproductive: Hysterectomy.  No adnexal mass. Other: No significant free fluid.  Moderate pelvic floor laxity. Subcutaneous edema which is most localized about the right labia, including on 80/2. Subtle fluid with peripheral enhancement including at maximally 1.5 cm on images 82-84 of  series 2. Edema tracks caudal the right-side of the fem-fem bypass on 72/2, 22/5. More symmetric, presumably postoperative interstitial thickening about the fem-fem bypass insertion sites. Musculoskeletal: Osteopenia. Prior sacroplasty. Severe T11 compression deformity, new  since 05/21/2015. Mild ventral canal encroachment. Similar grade 2 L5-S1 anterolisthesis. IMPRESSION: 1. Edema likely representing cellulitis about the right groin and right labia. Peripherally enhancing fluid density for which developing tiny labial abscess is a concern. 2. Bladder wall thickening and surrounding edema, suspicious for cystitis. 3.  Possible constipation. 4. Coronary artery atherosclerosis. Aortic Atherosclerosis (ICD10-I70.0). Emphysema (ICD10-J43.9). 5. Cholelithiasis. 6. Osteopenia with T11 compression deformity, new since 05/21/2015. Electronically Signed   By: Abigail Miyamoto M.D.   On: 10/18/2019 12:38     Medications:   . sodium chloride    .  ceFAZolin (ANCEF) IV     . aspirin EC  325 mg Oral Daily  . cyclobenzaprine  5 mg Oral QHS  . feeding supplement (NEPRO CARB STEADY)  237 mL Oral Daily  . feeding supplement (PRO-STAT SUGAR FREE 64)  30 mL Oral BID  . gabapentin  100 mg Oral TID  . [START ON 10/19/2019] levothyroxine  100 mcg Oral QAC breakfast  . lidocaine  1 patch Transdermal Q12H  . metoprolol tartrate  25 mg Oral q1800  . multivitamin  1 tablet Oral Daily  . pantoprazole  40 mg Oral Daily  . sevelamer carbonate  800 mg Oral TID WC  . sodium chloride flush  3 mL Intravenous Q12H  . valACYclovir  500 mg Oral QHS   sodium chloride, acetaminophen **OR** acetaminophen, docusate sodium, fluticasone, lidocaine-prilocaine, ondansetron **OR** ondansetron (ZOFRAN) IV, sodium chloride, sodium chloride flush  Assessment/ Plan:  [name] is a 69 y.o. white female with end stage renal disease on hemodialysis, hypertension, hypothyroidism, COPD, congestive heart failure, AAA who is admitted to [facility] on 10/18/2019 for Cellulitis of labia [N76.2] Placed on cefazolin.   [facility] [place] Left AVG 47.5kg.   1. End Stage Renal Disease: No indication for dialysis today.  - dialysis for tomorrow.   2. Hypertension:  - metoprolol  3. Anemia of  chronic kidney disease: hemoglobin 10.2. Macrocytic.   4. Secondary Hyperparathyroidism:  - Sevelamer with meals.    LOS: 0 Sabas Frett 7/21/20215:14 PM

## 2019-10-18 NOTE — ED Notes (Signed)
Report to [place] RN  Pt moved to room 36
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
## 2019-10-18 NOTE — ED Notes (Signed)
Pt denies needs at this time. Pt aware she will be taken to the floor.

## 2019-10-18 NOTE — Progress Notes (Signed)
Pharmacy Antibiotic Note  [name] is a 69 y.o. female admitted on 10/18/2019 with cellulitis.  Pharmacy has been consulted for Vanc dosing.  Pt was on HD at home Q MWF,  Next HD session scheduled for Thurs 7/22.    Plan: Vancomycin 1 gm IV X 1 ordered for 7/21. Vancomycin 500 mg IV Q T-Th-Sat ordered to start 7/22 (will need to confirm T-Th-Sat schedule)  No Vanc trough currently ordered.   Height: 5\' 4"  (162.6 cm) Weight: 47.2 kg (104 lb) IBW/kg (Calculated) : 54.7  Temp (24hrs), Avg:99.3 F (37.4 C), Min:99.3 F (37.4 C), Max:99.3 F (37.4 C)  Recent Labs  Lab 10/18/19 1051  WBC 14.7*  CREATININE 4.87*  LATICACIDVEN 1.1    Estimated Creatinine Clearance: 8 mL/min (A) (by C-G formula based on SCr of 4.87 mg/dL (H)).    Allergies  Allergen Reactions  . Asa [Aspirin] Other (See Comments)    Patient not sure why this is listed, she takes 325 mg Aspirin once daily  . 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 Plavix [Clopidogrel] Other (See Comments)    unknown    Antimicrobials this admission:   >>    >>   Dose adjustments this admission:   Microbiology results:  BCx:   UCx:    Sputum:    MRSA PCR:   Thank you for allowing pharmacy to be a part of this patient's care.  Malvern Kadlec D 10/18/2019 6:06 PM

## 2019-10-18 NOTE — ED Triage Notes (Signed)
Pt arrives to ER for "knot on my groin" pt states it's on R side. A&O, in personal wheelchair.

## 2019-10-19 ENCOUNTER — Inpatient Hospital Stay: Payer: Medicare Other

## 2019-10-19 DIAGNOSIS — M79604 Pain in right leg: Secondary | ICD-10-CM

## 2019-10-19 DIAGNOSIS — I5032 Chronic diastolic (congestive) heart failure: Secondary | ICD-10-CM

## 2019-10-19 DIAGNOSIS — N764 Abscess of vulva: Secondary | ICD-10-CM

## 2019-10-19 LAB — BASIC METABOLIC PANEL
Anion gap: 13 (ref 5–15)
BUN: 43 mg/dL — ABNORMAL HIGH (ref 8–23)
CO2: 29 mmol/L (ref 22–32)
Calcium: 8.9 mg/dL (ref 8.9–10.3)
Chloride: 96 mmol/L — ABNORMAL LOW (ref 98–111)
Creatinine, Ser: 6.08 mg/dL — ABNORMAL HIGH (ref 0.44–1.00)
GFR calc Af Amer: 7 mL/min — ABNORMAL LOW (ref >60)
GFR calc non Af Amer: 6 mL/min — ABNORMAL LOW (ref >60)
Glucose, Bld: 82 mg/dL (ref 70–99)
Potassium: 4.7 mmol/L (ref 3.5–5.1)
Sodium: 138 mmol/L (ref 135–145)

## 2019-10-19 LAB — CBC
HCT: 29.5 % — ABNORMAL LOW (ref 36.0–46.0)
Hemoglobin: 9.9 g/dL — ABNORMAL LOW (ref 12.0–15.0)
MCH: 36 pg — ABNORMAL HIGH (ref 26.0–34.0)
MCHC: 33.6 g/dL (ref 30.0–36.0)
MCV: 107.3 fL — ABNORMAL HIGH (ref 80.0–100.0)
Platelets: 221 10*3/uL (ref 150–400)
RBC: 2.75 MIL/uL — ABNORMAL LOW (ref 3.87–5.11)
RDW: 14.4 % (ref 11.5–15.5)
WBC: 14.3 10*3/uL — ABNORMAL HIGH (ref 4.0–10.5)
nRBC: 0 % (ref 0.0–0.2)

## 2019-10-19 LAB — HIV ANTIBODY (ROUTINE TESTING W REFLEX): HIV Screen 4th Generation wRfx: NONREACTIVE

## 2019-10-19 LAB — GLUCOSE, CAPILLARY: Glucose-Capillary: 110 mg/dL — ABNORMAL HIGH (ref 70–99)

## 2019-10-19 LAB — MRSA PCR SCREENING: MRSA by PCR: POSITIVE — AB

## 2019-10-19 LAB — HEPATITIS B SURFACE ANTIGEN: Hepatitis B Surface Ag: NONREACTIVE

## 2019-10-19 MED ORDER — MUPIROCIN 2 % EX OINT
1.0000 "application " | TOPICAL_OINTMENT | Freq: Two times a day (BID) | CUTANEOUS | Status: DC
  Administered 2019-10-19 – 2019-10-21 (×5): 1 via NASAL
  Filled 2019-10-19: qty 22

## 2019-10-19 MED ORDER — CHLORHEXIDINE GLUCONATE CLOTH 2 % EX PADS
6.0000 | MEDICATED_PAD | Freq: Every day | CUTANEOUS | Status: DC
  Administered 2019-10-19 – 2019-10-21 (×2): 6 via TOPICAL

## 2019-10-19 MED ORDER — OXYCODONE HCL 5 MG PO TABS
5.0000 mg | ORAL_TABLET | Freq: Four times a day (QID) | ORAL | Status: DC | PRN
  Administered 2019-10-19: 5 mg via ORAL
  Filled 2019-10-19: qty 1

## 2019-10-19 MED ORDER — PIPERACILLIN-TAZOBACTAM IN DEX 2-0.25 GM/50ML IV SOLN
2.2500 g | Freq: Three times a day (TID) | INTRAVENOUS | Status: DC
  Administered 2019-10-19 – 2019-10-21 (×5): 2.25 g via INTRAVENOUS
  Filled 2019-10-19 (×9): qty 50

## 2019-10-19 MED ORDER — PIPERACILLIN-TAZOBACTAM 3.375 G IVPB 30 MIN
3.3750 g | Freq: Once | INTRAVENOUS | Status: AC
  Administered 2019-10-19: 3.375 g via INTRAVENOUS
  Filled 2019-10-19 (×2): qty 50

## 2019-10-19 NOTE — Progress Notes (Signed)
[facility]  ROUNDING NOTE   Subjective:   Seen and examined on hemodialysis treatment. Tolerating treatment well.     HEMODIALYSIS FLOWSHEET:  Blood Flow Rate (mL/min): 200 mL/min Arterial Pressure (mmHg): -30 mmHg Venous Pressure (mmHg): 20 mmHg Transmembrane Pressure (mmHg): 30 mmHg Ultrafiltration Rate (mL/min): 300 mL/min Dialysate Flow Rate (mL/min): 600 ml/min Conductivity: Machine : 13.8 Conductivity: Machine : 13.8 Dialysis Fluid Bolus: Normal Saline Bolus Amount (mL): 250 mL    Objective:  Vital signs in last 24 hours:  Temp:  [98 F (36.7 C)-98.7 F (37.1 C)] 98.6 F (37 C) (07/22 1338) Pulse Rate:  [83-103] 103 (07/22 1338) Resp:  [12-29] 16 (07/22 1338) BP: (93-160)/(45-63) 130/57 (07/22 1338) SpO2:  [91 %-99 %] 97 % (07/22 1338)  Weight change:  Filed Weights   10/18/19 0922  Weight: 47.2 kg    Intake/Output: I/O last 3 completed shifts: In: 41 [P.O.:220; IV Piggyback:300] Out: 0    Intake/Output this shift:  Total I/O In: -  Out: 768 [Other:768]  Physical Exam: General: NAD, laying in bed  Head: Normocephalic, atraumatic. Moist oral mucosal membranes  Eyes: Anicteric, PERRL  Neck: Supple, trachea midline  Lungs:  Clear to auscultation  Heart: Regular rate and rhythm  Abdomen:  Soft, nontender,   Extremities: no peripheral edema.  Neurologic: Nonfocal, moving all four extremities  Skin: No lesions  Access:  Left AVG    Basic Metabolic Panel: Recent Labs  Lab 10/18/19 1051 10/19/19 0458  NA 141 138  K 3.9 4.7  CL 96* 96*  CO2 28 29  GLUCOSE 85 82  BUN 30* 43*  CREATININE 4.87* 6.08*  CALCIUM 9.1 8.9    Liver Function Tests: Recent Labs  Lab 10/18/19 1051  AST 22  ALT 15  ALKPHOS 143*  BILITOT 1.3*  PROT 6.5  ALBUMIN 3.1*   No results for input(s): LIPASE, AMYLASE in the last 168 hours. No results for input(s): AMMONIA in the last 168 hours.  CBC: Recent Labs  Lab 10/18/19 1051 10/18/19 2100  10/19/19 0458  WBC 14.7* 16.0* 14.3*  NEUTROABS 12.1*  --   --   HGB 10.2* 11.8* 9.9*  HCT 29.7* 34.6* 29.5*  MCV 105.7* 107.1* 107.3*  PLT 214 216 221    Cardiac Enzymes: No results for input(s): CKTOTAL, CKMB, CKMBINDEX, TROPONINI in the last 168 hours.  BNP: Invalid input(s): POCBNP  CBG: Recent Labs  Lab 10/19/19 1333  GLUCAP 110*    Microbiology: Results for orders placed or performed during the hospital encounter of 10/18/19  SARS Coronavirus 2 by RT PCR (hospital order, performed in [facility] hospital lab) Nasopharyngeal Nasopharyngeal Swab     Status: None   Collection Time: 10/18/19 12:31 PM   Specimen: Nasopharyngeal Swab  Result Value Ref Range Status   SARS Coronavirus 2 NEGATIVE NEGATIVE Final    Comment: (NOTE) SARS-CoV-2 target nucleic acids are NOT DETECTED.  The SARS-CoV-2 RNA is generally detectable in upper and lower respiratory specimens during the acute phase of infection. The lowest concentration of SARS-CoV-2 viral copies this assay can detect is 250 copies / mL. A negative result does not preclude SARS-CoV-2 infection and should not be used as the sole basis for treatment or other patient management decisions.  A negative result may occur with improper specimen collection / handling, submission of specimen other than nasopharyngeal swab, presence of viral mutation(s) within the areas targeted by this assay, and inadequate number of viral copies (<250 copies / mL). A negative result  must be combined with clinical observations, patient history, and epidemiological information.  Fact Sheet for Patients:   StrictlyIdeas.no  Fact Sheet for Healthcare Providers: BankingDealers.co.za  This test is not yet approved or  cleared by the [place] FDA and has been authorized for detection and/or diagnosis of SARS-CoV-2 by FDA under an Emergency Use Authorization (EUA).  This EUA will remain in effect  (meaning this test can be used) for the duration of the COVID-19 declaration under Section 564(b)(1) of the Act, 21 U.S.C. section 360bbb-3(b)(1), unless the authorization is terminated or revoked sooner.  Performed at [facility], [address] [place], [place] [address]   MRSA PCR Screening     Status: Abnormal   Collection Time: 10/19/19 12:30 AM   Specimen: Nasal Mucosa; Nasopharyngeal  Result Value Ref Range Status   MRSA by PCR POSITIVE (A) NEGATIVE Final    Comment:        The GeneXpert MRSA Assay (FDA approved for NASAL specimens only), is one component of a comprehensive MRSA colonization surveillance program. It is not intended to diagnose MRSA infection nor to guide or monitor treatment for MRSA infections. RESULT CALLED TO, READ BACK BY AND VERIFIED WITH: MARIA JODLOMAN AT 0211 on 10/19/2019 [facility]. Performed at [facility], [address] [place], [place] [address]     Coagulation Studies: Recent Labs    10/18/19 1051  LABPROT 13.8  INR 1.1    Urinalysis: No results for input(s): COLORURINE, LABSPEC, PHURINE, GLUCOSEU, HGBUR, BILIRUBINUR, KETONESUR, PROTEINUR, UROBILINOGEN, NITRITE, LEUKOCYTESUR in the last 72 hours.  Invalid input(s): APPERANCEUR    Imaging: US Pelvis Complete  Result Date: 10/18/2019 CLINICAL DATA:  Right groin and previous area swelling, AAA graft 06/18/2019 EXAM: US PELVIS COMPLETE TECHNIQUE: Ultrasound examination of the pelvic soft tissues was performed in the area of clinical concern. COMPARISON:  CT pelvis, 02/17/2019 FINDINGS: Targeted ultrasound examination of the pelvis at the patient identified area of the midline and right pubic area between the right groin and labia reveals a superficially open wound with extensive surrounding soft tissue edema and an ill-defined hypoechoic region deep to the skin in the soft tissues of the right pubic area measuring 6.7 x 3.6 x 1.8 cm. A midline vascular graft, presumably  the femoral femoral bypass graft seen on prior comparison CT, demonstrates internal Doppler flow. IMPRESSION: 1. Targeted ultrasound examination of the pelvis at the patient identified area of the midline and right pubic area between the right groin and labia reveals a superficially open wound with extensive surrounding soft tissue edema and an ill-defined hypoechoic region deep to the skin in the soft tissues of the right pubic area measuring 6.7 x 3.6 x 1.8 cm. This is generally concerning for abscess in the setting of relatively recent prior surgery. Recommend contrast enhanced CT to further evaluate. 2. A vascular graft is identified in the midline pubic superficial soft tissues, presumably the femoral-femoral bypass graft seen on prior comparison CT, with internal Doppler flow. This is not fully evaluated for graft patency by ultrasound. Electronically Signed   By: Eddie Candle M.D.   On: 10/18/2019 11:41   CT ABDOMEN PELVIS W CONTRAST  Result Date: 10/18/2019 CLINICAL DATA:  Right groin pain. History of abdominal aortic aneurysm repair. Dialysis since 2016. EXAM: CT ABDOMEN AND PELVIS WITH CONTRAST TECHNIQUE: Multidetector CT imaging of the abdomen and pelvis was performed using the standard protocol following bolus administration of intravenous contrast. CONTRAST:  100mL OMNIPAQUE IOHEXOL 300 MG/ML  SOLN COMPARISON:  05/21/2015 FINDINGS: Lower chest: Emphysema. Normal heart size without pericardial or pleural effusion. Multivessel coronary artery atherosclerosis. Hepatobiliary: Too small to characterize subcentimeter anterior right hepatic lobe low-density lesion is unchanged. 7 mm gallstone.  No acute cholecystitis or biliary duct dilatation. Pancreas: Normal, without mass or ductal dilatation. Spleen: Normal in size, without focal abnormality. Adrenals/Urinary Tract: Normal adrenal glands. Native renal artery atrophy, without hydronephrosis. Bladder wall thickening and surrounding edema, including on  69/2. Stomach/Bowel: Normal stomach, without wall thickening. Postsurgical changes within the sigmoid. Colonic stool burden suggests constipation. Normal terminal ileum. Surgical sutures in the region of the cecum. Normal small bowel. Vascular/Lymphatic: Advanced aortic and branch vessel atherosclerosis. Status post aortic stent graft repair, with significant decrease in size of native sac. Surgical changes about the proximal right common iliac artery with stent graft within. There is also a stent in the right external iliac artery. Status post fem-fem bypass, patent. No abdominopelvic adenopathy. Reproductive: Hysterectomy.  No adnexal mass. Other: No significant free fluid.  Moderate pelvic floor laxity. Subcutaneous edema which is most localized about the right labia, including on 80/2. Subtle fluid with peripheral enhancement including at maximally 1.5 cm on images 82-84 of series 2. Edema tracks caudal the right-side of the fem-fem bypass on 72/2, 22/5. More symmetric, presumably postoperative interstitial thickening about the fem-fem bypass insertion sites. Musculoskeletal: Osteopenia. Prior sacroplasty. Severe T11 compression deformity, new since 05/21/2015. Mild ventral canal encroachment. Similar grade 2 L5-S1 anterolisthesis. IMPRESSION: 1. Edema likely representing cellulitis about the right groin and right labia. Peripherally enhancing fluid density for which developing tiny labial abscess is a concern. 2. Bladder wall thickening and surrounding edema, suspicious for cystitis. 3.  Possible constipation. 4. Coronary artery atherosclerosis. Aortic Atherosclerosis (ICD10-I70.0). Emphysema (ICD10-J43.9). 5. Cholelithiasis. 6. Osteopenia with T11 compression deformity, new since 05/21/2015. Electronically Signed   By: Abigail Miyamoto M.D.   On: 10/18/2019 12:38     Medications:   . sodium chloride 250 mL (10/19/19 1428)  . piperacillin-tazobactam 3.375 g (10/19/19 1429)  . vancomycin Stopped (10/19/19  1352)   . aspirin EC  325 mg Oral Daily  . Chlorhexidine Gluconate Cloth  6 each Topical Q0600  . cyclobenzaprine  5 mg Oral QHS  . feeding supplement (NEPRO CARB STEADY)  237 mL Oral Daily  . feeding supplement (PRO-STAT SUGAR FREE 64)  30 mL Oral BID  . gabapentin  100 mg Oral TID  . levothyroxine  100 mcg Oral QAC breakfast  . lidocaine  1 patch Transdermal Q12H  . metoprolol tartrate  25 mg Oral q1800  . multivitamin  1 tablet Oral Daily  . mupirocin ointment  1 application Nasal BID  . pantoprazole  40 mg Oral Daily  . sevelamer carbonate  800 mg Oral TID WC  . sodium chloride flush  3 mL Intravenous Q12H  . valACYclovir  500 mg Oral QHS   sodium chloride, acetaminophen **OR** acetaminophen, docusate sodium, fluticasone, lidocaine-prilocaine, ondansetron **OR** ondansetron (ZOFRAN) IV, oxyCODONE, sodium chloride, sodium chloride flush  Assessment/ Plan:  [name] is a 69 y.o. white female with end stage renal disease on hemodialysis, hypertension, hypothyroidism, COPD, congestive heart failure, AAA who is admitted to [facility] on 10/18/2019 for Labial abscess [N76.4] Cellulitis of labia [N76.2] Cellulitis, unspecified cellulitis site [L03.90] Placed on cefazolin.   [facility] [place] Left AVG 47.5kg.   1. End Stage Renal Disease:  - Seen and examined on hemodialysis treatment.   2. Hypertension:  - metoprolol  3. Anemia of  chronic kidney disease: hemoglobin 9.9. Macrocytic.   4. Secondary Hyperparathyroidism:  - Sevelamer with meals.    LOS: 1 Wilferd Ritson 7/22/20212:34 PM

## 2019-10-19 NOTE — Progress Notes (Signed)
Pharmacy Antibiotic Note  [name] is a 69 y.o. female admitted on 10/18/2019 with cellulitis.  Pharmacy has been consulted for Vanc/Zosyn dosing.    Pt was on HD at home Q MWF,  Next HD session scheduled for Thurs 7/22.    Plan: Will continue Vancomycin 500 mg IV Q T-Th-Sat (per current HD schedule)   No Vanc trough currently ordered.   Will give pt Zosyn 3.375g loading dose, followed by Zosyn 2.25g q8h (per HD pt)  Height: 5\' 4"  (162.6 cm) Weight: 47.2 kg (104 lb) IBW/kg (Calculated) : 54.7  Temp (24hrs), Avg:98.3 F (36.8 C), Min:98 F (36.7 C), Max:98.7 F (37.1 C)  Recent Labs  Lab 10/18/19 1051 10/18/19 2100 10/19/19 0458  WBC 14.7* 16.0* 14.3*  CREATININE 4.87*  --  6.08*  LATICACIDVEN 1.1  --   --     Estimated Creatinine Clearance: 6.4 mL/min (A) (by C-G formula based on SCr of 6.08 mg/dL (H)).    Allergies  Allergen Reactions  . Asa [Aspirin] Other (See Comments)    Patient not sure why this is listed, she takes 325 mg Aspirin once daily  . 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 Plavix [Clopidogrel] Other (See Comments)    unknown    Antimicrobials this admission: Zosyn 7/20 x 1  Vancomycin 7/21  >>  Zosyn 7/22 >>   Dose adjustments this admission: None  Microbiology results: MRSA PCR: POSITIVE  Thank you for allowing pharmacy to be a part of this patient's care.  Lu Duffel, PharmD, BCPS Clinical Pharmacist 10/19/2019 3:24 PM

## 2019-10-19 NOTE — Progress Notes (Signed)
OT Cancellation Note  Patient Details Name: [name] MRN: [id number] DOB: 1950/08/31   Cancelled Treatment:    Reason Eval/Treat Not Completed: Other (comment). Consult received, chart reviewed. Pt pending doppler to r/o DVT. Will hold OT evaluation at this time and re-attempt at later date/time as medically appropriate.   Jeni Salles, MPH, MS, OTR/L ascom [phone] 10/19/19, 2:25 PM

## 2019-10-19 NOTE — Progress Notes (Signed)
Patient ID: [name], female   DOB: February 13, 1951, 69 y.o.   MRN: [id number] [facility] PROGRESS NOTE  [name] ESP:[id number] DOB: [birth year] DOA: 10/18/2019 PCP: Baxter Hire, MD  HPI/Subjective: Patient feeling a little bit better today.  Came in with vulvar cellulitis.  Patient not having any pain if she is lying flat.  If she sitting up in her wheelchair she has discomfort.  Patient this afternoon complaining of some leg pain.  Objective: Vitals:   10/19/19 1305 10/19/19 1338  BP: (!) 148/62 (!) 130/57  Pulse: 93 103  Resp: 13 16  Temp: 98 F (36.7 C) 98.6 F (37 C)  SpO2: 99% 97%    Intake/Output Summary (Last 24 hours) at 10/19/2019 1352 Last data filed at 10/19/2019 1305 Gross per 24 hour  Intake 520 ml  Output 768 ml  Net -248 ml   Filed Weights   10/18/19 0922  Weight: 47.2 kg    ROS: Review of Systems  Respiratory: Negative for cough and shortness of breath.   Cardiovascular: Negative for chest pain.  Gastrointestinal: Negative for abdominal pain.  Musculoskeletal: Positive for joint pain.   Exam: Physical Exam HENT:     Nose: No mucosal edema.     Mouth/Throat:     Pharynx: No oropharyngeal exudate.  Eyes:     General: Lids are normal.     Conjunctiva/sclera: Conjunctivae normal.     Pupils: Pupils are equal, round, and reactive to light.  Neck:     Thyroid: No thyroid mass or thyromegaly.     Vascular: No carotid bruit or JVD.  Cardiovascular:     Rate and Rhythm: Normal rate and regular rhythm.     Heart sounds: Normal heart sounds, S1 normal and S2 normal.  Pulmonary:     Breath sounds: No decreased breath sounds, wheezing, rhonchi or rales.  Abdominal:     Palpations: Abdomen is soft.     Tenderness: There is no abdominal tenderness.  Musculoskeletal:     Cervical back: No edema.     Right ankle: No swelling.     Left ankle: No swelling.  Lymphadenopathy:     Cervical: No cervical adenopathy.  Skin:    General:  Skin is warm.     Comments: Patient with some swelling of the right labia, slight erythema.  Area looking like it is coming to a head. Bottom of the right foot healing ulceration. Right shin slight skin ulceration.  No signs of infection.  Neurological:     Mental Status: She is alert and oriented to person, place, and time.       Data Reviewed: Basic Metabolic Panel: Recent Labs  Lab 10/18/19 1051 10/19/19 0458  NA 141 138  K 3.9 4.7  CL 96* 96*  CO2 28 29  GLUCOSE 85 82  BUN 30* 43*  CREATININE 4.87* 6.08*  CALCIUM 9.1 8.9   Liver Function Tests: Recent Labs  Lab 10/18/19 1051  AST 22  ALT 15  ALKPHOS 143*  BILITOT 1.3*  PROT 6.5  ALBUMIN 3.1*   CBC: Recent Labs  Lab 10/18/19 1051 10/18/19 2100 10/19/19 0458  WBC 14.7* 16.0* 14.3*  NEUTROABS 12.1*  --   --   HGB 10.2* 11.8* 9.9*  HCT 29.7* 34.6* 29.5*  MCV 105.7* 107.1* 107.3*  PLT 214 216 221    CBG: Recent Labs  Lab 10/19/19 1333  GLUCAP 110*    Recent Results (from the past 240 hour(s))  SARS Coronavirus 2  by RT PCR (hospital order, performed in [facility] hospital lab) Nasopharyngeal Nasopharyngeal Swab     Status: None   Collection Time: 10/18/19 12:31 PM   Specimen: Nasopharyngeal Swab  Result Value Ref Range Status   SARS Coronavirus 2 NEGATIVE NEGATIVE Final    Comment: (NOTE) SARS-CoV-2 target nucleic acids are NOT DETECTED.  The SARS-CoV-2 RNA is generally detectable in upper and lower respiratory specimens during the acute phase of infection. The lowest concentration of SARS-CoV-2 viral copies this assay can detect is 250 copies / mL. A negative result does not preclude SARS-CoV-2 infection and should not be used as the sole basis for treatment or other patient management decisions.  A negative result may occur with improper specimen collection / handling, submission of specimen other than nasopharyngeal swab, presence of viral mutation(s) within the areas targeted by this  assay, and inadequate number of viral copies (<250 copies / mL). A negative result must be combined with clinical observations, patient history, and epidemiological information.  Fact Sheet for Patients:   StrictlyIdeas.no  Fact Sheet for Healthcare Providers: BankingDealers.co.za  This test is not yet approved or  cleared by the [place] FDA and has been authorized for detection and/or diagnosis of SARS-CoV-2 by FDA under an Emergency Use Authorization (EUA).  This EUA will remain in effect (meaning this test can be used) for the duration of the COVID-19 declaration under Section 564(b)(1) of the Act, 21 U.S.C. section 360bbb-3(b)(1), unless the authorization is terminated or revoked sooner.  Performed at [facility], [address] [place], [place] [address]   MRSA PCR Screening     Status: Abnormal   Collection Time: 10/19/19 12:30 AM   Specimen: Nasal Mucosa; Nasopharyngeal  Result Value Ref Range Status   MRSA by PCR POSITIVE (A) NEGATIVE Final    Comment:        The GeneXpert MRSA Assay (FDA approved for NASAL specimens only), is one component of a comprehensive MRSA colonization surveillance program. It is not intended to diagnose MRSA infection nor to guide or monitor treatment for MRSA infections. RESULT CALLED TO, READ BACK BY AND VERIFIED WITH: MARIA JODLOMAN AT 0211 on 10/19/2019 [facility]. Performed at [facility], [address] [place], [place] [address]      Studies: US Pelvis Complete  Result Date: 10/18/2019 CLINICAL DATA:  Right groin and previous area swelling, AAA graft 06/18/2019 EXAM: US PELVIS COMPLETE TECHNIQUE: Ultrasound examination of the pelvic soft tissues was performed in the area of clinical concern. COMPARISON:  CT pelvis, 02/17/2019 FINDINGS: Targeted ultrasound examination of the pelvis at the patient identified area of the midline and right pubic area between the right  groin and labia reveals a superficially open wound with extensive surrounding soft tissue edema and an ill-defined hypoechoic region deep to the skin in the soft tissues of the right pubic area measuring 6.7 x 3.6 x 1.8 cm. A midline vascular graft, presumably the femoral femoral bypass graft seen on prior comparison CT, demonstrates internal Doppler flow. IMPRESSION: 1. Targeted ultrasound examination of the pelvis at the patient identified area of the midline and right pubic area between the right groin and labia reveals a superficially open wound with extensive surrounding soft tissue edema and an ill-defined hypoechoic region deep to the skin in the soft tissues of the right pubic area measuring 6.7 x 3.6 x 1.8 cm. This is generally concerning for abscess in the setting of relatively recent prior surgery. Recommend contrast enhanced CT to further evaluate.  2. A vascular graft is identified in the midline pubic superficial soft tissues, presumably the femoral-femoral bypass graft seen on prior comparison CT, with internal Doppler flow. This is not fully evaluated for graft patency by ultrasound. Electronically Signed   By: Eddie Candle M.D.   On: 10/18/2019 11:41   CT ABDOMEN PELVIS W CONTRAST  Result Date: 10/18/2019 CLINICAL DATA:  Right groin pain. History of abdominal aortic aneurysm repair. Dialysis since 2016. EXAM: CT ABDOMEN AND PELVIS WITH CONTRAST TECHNIQUE: Multidetector CT imaging of the abdomen and pelvis was performed using the standard protocol following bolus administration of intravenous contrast. CONTRAST:  75mL OMNIPAQUE IOHEXOL 300 MG/ML  SOLN COMPARISON:  05/21/2015 FINDINGS: Lower chest: Emphysema. Normal heart size without pericardial or pleural effusion. Multivessel coronary artery atherosclerosis. Hepatobiliary: Too small to characterize subcentimeter anterior right hepatic lobe low-density lesion is unchanged. 7 mm gallstone.  No acute cholecystitis or biliary duct dilatation.  Pancreas: Normal, without mass or ductal dilatation. Spleen: Normal in size, without focal abnormality. Adrenals/Urinary Tract: Normal adrenal glands. Native renal artery atrophy, without hydronephrosis. Bladder wall thickening and surrounding edema, including on 69/2. Stomach/Bowel: Normal stomach, without wall thickening. Postsurgical changes within the sigmoid. Colonic stool burden suggests constipation. Normal terminal ileum. Surgical sutures in the region of the cecum. Normal small bowel. Vascular/Lymphatic: Advanced aortic and branch vessel atherosclerosis. Status post aortic stent graft repair, with significant decrease in size of native sac. Surgical changes about the proximal right common iliac artery with stent graft within. There is also a stent in the right external iliac artery. Status post fem-fem bypass, patent. No abdominopelvic adenopathy. Reproductive: Hysterectomy.  No adnexal mass. Other: No significant free fluid.  Moderate pelvic floor laxity. Subcutaneous edema which is most localized about the right labia, including on 80/2. Subtle fluid with peripheral enhancement including at maximally 1.5 cm on images 82-84 of series 2. Edema tracks caudal the right-side of the fem-fem bypass on 72/2, 22/5. More symmetric, presumably postoperative interstitial thickening about the fem-fem bypass insertion sites. Musculoskeletal: Osteopenia. Prior sacroplasty. Severe T11 compression deformity, new since 05/21/2015. Mild ventral canal encroachment. Similar grade 2 L5-S1 anterolisthesis. IMPRESSION: 1. Edema likely representing cellulitis about the right groin and right labia. Peripherally enhancing fluid density for which developing tiny labial abscess is a concern. 2. Bladder wall thickening and surrounding edema, suspicious for cystitis. 3.  Possible constipation. 4. Coronary artery atherosclerosis. Aortic Atherosclerosis (ICD10-I70.0). Emphysema (ICD10-J43.9). 5. Cholelithiasis. 6. Osteopenia with T11  compression deformity, new since 05/21/2015. Electronically Signed   By: Abigail Miyamoto M.D.   On: 10/18/2019 12:38    Scheduled Meds: . aspirin EC  325 mg Oral Daily  . Chlorhexidine Gluconate Cloth  6 each Topical Q0600  . cyclobenzaprine  5 mg Oral QHS  . feeding supplement (NEPRO CARB STEADY)  237 mL Oral Daily  . feeding supplement (PRO-STAT SUGAR FREE 64)  30 mL Oral BID  . gabapentin  100 mg Oral TID  . levothyroxine  100 mcg Oral QAC breakfast  . lidocaine  1 patch Transdermal Q12H  . metoprolol tartrate  25 mg Oral q1800  . multivitamin  1 tablet Oral Daily  . mupirocin ointment  1 application Nasal BID  . pantoprazole  40 mg Oral Daily  . sevelamer carbonate  800 mg Oral TID WC  . sodium chloride flush  3 mL Intravenous Q12H  . valACYclovir  500 mg Oral QHS   Continuous Infusions: . sodium chloride    . vancomycin 500 mg (10/19/19 1216)  Assessment/Plan:  1. Cellulitis and possible abscess of the right labia.  Patient was empirically started on Ancef and switched over to vancomycin.  I will add Zosyn.  Patient seen with Dr. Marcelline Mates gynecology and she recommended antibiotic therapy and warm soaks at this time and no incision and drainage at this point. 2. Leg pain bilaterally.  We will get a venous ultrasound to rule out DVT 3. End-stage renal disease on dialysis.  Dialysis today. 4. Chronic respiratory failure with hypoxia on oxygen 5. Hypothyroidism unspecified on levothyroxine 6. Chronic diastolic congestive heart failure.  Dialysis to remove fluid.  No current signs of heart failure. 7. Neuropathy on gabapentin 8. Weakness and mostly wheelchair-bound.  PT and OT consultations    Code Status:     Code Status Orders  (From admission, onward)         Start     Ordered   10/18/19 1648  Full code  Continuous        10/18/19 1649        Code Status History    Date Active Date Inactive Code Status Order ID Comments User Context   07/16/2015 0235 07/23/2015 1839  Full Code [id number]  Saundra Shelling, MD Inpatient   06/15/2015 1837 06/24/2015 1933 Full Code [id number]  Bettey Costa, MD Inpatient   05/20/2015 1113 06/01/2015 2121 Full Code [id number]  Fritzi Mandes, MD Inpatient   04/09/2015 1024 04/22/2015 2115 Full Code [id number]  Theodoro Grist, MD Inpatient   Advance Care Planning Activity     Family Communication: Tried to reach husband on the cell phone.  Unable to leave a message.  Tried to reach him on the home phone.  Unable to reach. Disposition Plan: Status is: Inpatient  Dispo: The patient is from: Home              Anticipated d/c is to: Home              Anticipated d/c date is: Likely will need a few days of IV antibiotics              Patient currently being treated for vulvar cellulitis and possible abscess  Consultants:  Gynecology  Antibiotics:  Vancomycin  Added Zosyn  Time spent: 28 minutes  [place]

## 2019-10-19 NOTE — Progress Notes (Signed)
Initial Nutrition Assessment  DOCUMENTATION CODES:   Underweight  INTERVENTION:   Nepro Shake po daily, each supplement provides 425 kcal and 19 grams protein  Prostat liquid protein PO 30 ml BID with meals, each supplement provides 100 kcal, 15 grams protein.  Rena-vite daily   NUTRITION DIAGNOSIS:   Increased nutrient needs related to chronic illness (ESRD on HD) as evidenced by increased estimated needs.  GOAL:   Patient will meet greater than or equal to 90% of their needs  MONITOR:   PO intake, Supplement acceptance, Labs, Weight trends, Skin, I & O's  REASON FOR ASSESSMENT:   Other (Comment) (Low BMI)    ASSESSMENT:   69 y.o. white female with end stage renal disease on hemodialysis, hypertension, hypothyroidism, COPD, congestive heart failure, AAA who is admitted to [facility] on 10/18/2019 for Cellulitis of labia   RD working remotely.  Unable to reach pt by phone. Pt with increased estimated needs r/t ESRD on HD and COPD. Pt drinking Nepro and taking Prostat on her HD days. Recommend daily supplements to continue after discharge. Per chart, pt down 19lbs(16%) over the past 8 months; this is significant. RD suspects poor appetite and oral intake at baseline. RD will obtain nutrition related history and exam at follow up.   Pt at high risk for malnutrition   Medications reviewed and include: aspirin, synthtroid, rena-vite, protonix, renvela, vancomycin  Labs reviewed: BUN 43(H), creat 6.08(H) Wbc- 14.3(H), Hgb 9.9(L), Hct 29.5(L), MCV 107.3(H), MCV 36.0(H)  NUTRITION - FOCUSED PHYSICAL EXAM: Unable to perform at this time   Diet Order:   Diet Order            Diet renal with fluid restriction Fluid restriction: 1200 mL Fluid; Room service appropriate? Yes; Fluid consistency: Thin  Diet effective now                EDUCATION NEEDS:   No education needs have been identified at this time  Skin:  Skin Assessment: Reviewed RN Assessment (Wound neck)  Last  BM:  7/21  Height:   Ht Readings from Last 1 Encounters:  10/18/19 5\' 4"  (1.626 m)    Weight:   Wt Readings from Last 1 Encounters:  10/18/19 47.2 kg    Ideal Body Weight:  54.5 kg  BMI:  Body mass index is 17.85 kg/m.  Estimated Nutritional Needs:   Kcal:  1500-1700kcal/day  Protein:  75-85g/day  Fluid:  UOP +L  Koleen Distance MS, RD, LDN Please refer to [facility] for RD and/or RD on-call/weekend/after hours pager

## 2019-10-19 NOTE — Progress Notes (Signed)
PT Cancellation Note  Patient Details Name: [name] MRN: [id number] DOB: 1951-03-28   Cancelled Treatment:    Reason Eval/Treat Not Completed: Medical issues which prohibited therapy (Consult received and chart reviewed.  Noted orders for pending LE dopplers; contraindicated for exertional activity at this time.  Will hold until test complete, results received and patient cleared for activity.)   Nejla Reasor H. Owens Shark, PT, DPT, NCS 10/19/19, 2:36 PM [phone]

## 2019-10-19 NOTE — Progress Notes (Signed)
Hemodialysis patient known at [place] 11:00, patient's husband normally transports to treatments. Patient stated no dialysis concerns at this time. Please contact me with any dialysis placement concerns.   Elvera Bicker Dialysis Coordinator [phone]

## 2019-10-19 NOTE — Progress Notes (Signed)
Hospital Day # 1, admitted for right labial cellulitis.   Subjective: Patient notes that overall she is feeling somewhat better compared to yesterday.  Notes that lying back helps.     Patient Active Problem List   Diagnosis Date Noted  . Cellulitis of labia 10/18/2019  . History of MRSA infection 10/18/2019  . Renal disease 12/28/2018  . Chronic foot ulcer, left, with fat layer exposed ([place]) 03/08/2018  . Leg wound, left, initial encounter 05/19/2017  . Chronic respiratory failure with hypoxia ([place]) 03/01/2017  . PAD (peripheral artery disease) ([place]) 01/28/2017  . Aneurysm ([place]) 12/16/2016  . Complication from renal dialysis device 09/21/2016  . Encounter for continuous venovenous hemodiafiltration (CVVHD) ([place]) 09/25/2015  . ESRD (end stage renal disease) ([place]) 09/04/2015  . Infection due to vancomycin resistant Enterococcus (VRE) 07/01/2015  . Vascular device, implant, or graft infection or inflammation, subsequent encounter 07/01/2015  . Anemia 06/15/2015  . Complication of vascular access for dialysis 04/22/2015  . Thrombocytopenia ([place]) 04/22/2015  . Anemia of chronic disease 04/22/2015  . H/O blood transfusion reaction 04/22/2015  . HIT (heparin-induced thrombocytopenia) ([place]) 04/22/2015  . Generalized weakness 04/22/2015  . Malnutrition of moderate degree 04/19/2015  . Acute pulmonary edema with congestive heart failure ([place]) 04/09/2015  . Leukocytosis 04/09/2015  . Elevated troponin 04/09/2015  . Acute diastolic CHF (congestive heart failure) ([place]) 04/09/2015  . Pneumonia due to Pseudomonas species ([place]) 03/01/2015  . Chronic obstructive pulmonary disease, unspecified ([place]) 02/26/2015  . S/P dialysis catheter insertion ([place]) 02/22/2015  . Endoleak post (EVAR) endovascular aneurysm repair ([place]) 02/16/2015  . COPD exacerbation ([place]) 01/03/2015  . Alopecia 09/24/2014  . VAIN II (vaginal intraepithelial neoplasia grade II) 11/22/2013    Class: History of  .  Postmenopausal HRT (hormone replacement therapy) 09/18/2013  . Pernicious anemia 09/18/2013  . Elevated liver function tests 09/14/2011  . Hypothyroidism 05/30/2010  . HLD (hyperlipidemia) 05/30/2010  . Essential hypertension 05/30/2010  . Ruptured abdominal aortic aneurysm (AAA) ([place]) 05/30/2010  . Chronic obstructive pulmonary disease ([place]) 05/30/2010  . GERD 05/30/2010     Objective: Temp:  [98.2 F (36.8 C)-98.7 F (37.1 C)] 98.2 F (36.8 C) (07/22 0448) Pulse Rate:  [83-100] 83 (07/22 0448) Resp:  [16-18] 16 (07/22 0448) BP: (120-160)/(45-80) 120/45 (07/22 0448) SpO2:  [91 %-99 %] 97 % (07/22 0448)  Physical Exam:  General: alert and no distress  Lungs: clear to auscultation bilaterally Heart: regular rate and rhythm, S1, S2 normal, no murmur, click, rub or gallop Abdomen: soft, non-tender; bowel sounds normal; no masses,  no organomegaly Pelvis: external genitalia with right labial swelling tracking upward towards mons pubis (however improved since yesterday as it is less tender, and swelling near mons pubis is less). Small healing lesion on right labia (previous abscess site with small amount of drainage, non-fluctuant). Normal left labia.  Extremities: DVT Evaluation: No evidence of DVT seen on physical exam. Negative Homan's sign. No cords or calf tenderness. No significant calf/ankle edema.   Medications:  Scheduled Meds: . aspirin EC  325 mg Oral Daily  . Chlorhexidine Gluconate Cloth  6 each Topical Q0600  . cyclobenzaprine  5 mg Oral QHS  . feeding supplement (NEPRO CARB STEADY)  237 mL Oral Daily  . feeding supplement (PRO-STAT SUGAR FREE 64)  30 mL Oral BID  . gabapentin  100 mg Oral TID  . levothyroxine  100 mcg Oral QAC breakfast  . lidocaine  1 patch Transdermal Q12H  . metoprolol tartrate  25 mg Oral q1800  .  multivitamin  1 tablet Oral Daily  . mupirocin ointment  1 application Nasal BID  . pantoprazole  40 mg Oral Daily  . sevelamer carbonate  800 mg  Oral TID WC  . sodium chloride flush  3 mL Intravenous Q12H  . valACYclovir  500 mg Oral QHS   Continuous Infusions: . sodium chloride    . vancomycin     PRN Meds:.sodium chloride, acetaminophen **OR** acetaminophen, docusate sodium, fluticasone, lidocaine-prilocaine, ondansetron **OR** ondansetron (ZOFRAN) IV, sodium chloride, sodium chloride flush    Labs:  Results for orders placed or performed during the hospital encounter of 10/18/19  SARS Coronavirus 2 by RT PCR (hospital order, performed in [facility] hospital lab) Nasopharyngeal Nasopharyngeal Swab   Specimen: Nasopharyngeal Swab  Result Value Ref Range   SARS Coronavirus 2 NEGATIVE NEGATIVE  MRSA PCR Screening   Specimen: Nasal Mucosa; Nasopharyngeal  Result Value Ref Range   MRSA by PCR POSITIVE (A) NEGATIVE  Comprehensive metabolic panel  Result Value Ref Range   Sodium 141 135 - 145 mmol/L   Potassium 3.9 3.5 - 5.1 mmol/L   Chloride 96 (L) 98 - 111 mmol/L   CO2 28 22 - 32 mmol/L   Glucose, Bld 85 70 - 99 mg/dL   BUN 30 (H) 8 - 23 mg/dL   Creatinine, Ser 4.87 (H) 0.44 - 1.00 mg/dL   Calcium 9.1 8.9 - 10.3 mg/dL   Total Protein 6.5 6.5 - 8.1 g/dL   Albumin 3.1 (L) 3.5 - 5.0 g/dL   AST 22 15 - 41 U/L   ALT 15 0 - 44 U/L   Alkaline Phosphatase 143 (H) 38 - 126 U/L   Total Bilirubin 1.3 (H) 0.3 - 1.2 mg/dL   GFR calc non Af Amer 8 (L) >60 mL/min   GFR calc Af Amer 10 (L) >60 mL/min   Anion gap 17 (H) 5 - 15  CBC with Differential  Result Value Ref Range   WBC 14.7 (H) 4.0 - 10.5 K/uL   RBC 2.81 (L) 3.87 - 5.11 MIL/uL   Hemoglobin 10.2 (L) 12.0 - 15.0 g/dL   HCT 29.7 (L) 36 - 46 %   MCV 105.7 (H) 80.0 - 100.0 fL   MCH 36.3 (H) 26.0 - 34.0 pg   MCHC 34.3 30.0 - 36.0 g/dL   RDW 14.2 11.5 - 15.5 %   Platelets 214 150 - 400 K/uL   nRBC 0.0 0.0 - 0.2 %   Neutrophils Relative % 81 %   Neutro Abs 12.1 (H) 1.7 - 7.7 K/uL   Lymphocytes Relative 6 %   Lymphs Abs 0.9 0.7 - 4.0 K/uL   Monocytes Relative 10 %    Monocytes Absolute 1.4 (H) 0 - 1 K/uL   Eosinophils Relative 2 %   Eosinophils Absolute 0.2 0 - 0 K/uL   Basophils Relative 0 %   Basophils Absolute 0.1 0 - 0 K/uL   Immature Granulocytes 1 %   Abs Immature Granulocytes 0.11 (H) 0.00 - 0.07 K/uL  Protime-INR  Result Value Ref Range   Prothrombin Time 13.8 11.4 - 15.2 seconds   INR 1.1 0.8 - 1.2  APTT  Result Value Ref Range   aPTT 36 24 - 36 seconds  Lactic acid, plasma  Result Value Ref Range   Lactic Acid, Venous 1.1 0.5 - 1.9 mmol/L  CBC  Result Value Ref Range   WBC 14.3 (H) 4.0 - 10.5 K/uL   RBC 2.75 (L) 3.87 - 5.11 MIL/uL  Hemoglobin 9.9 (L) 12.0 - 15.0 g/dL   HCT 29.5 (L) 36 - 46 %   MCV 107.3 (H) 80.0 - 100.0 fL   MCH 36.0 (H) 26.0 - 34.0 pg   MCHC 33.6 30.0 - 36.0 g/dL   RDW 14.4 11.5 - 15.5 %   Platelets 221 150 - 400 K/uL   nRBC 0.0 0.0 - 0.2 %  Basic metabolic panel  Result Value Ref Range   Sodium 138 135 - 145 mmol/L   Potassium 4.7 3.5 - 5.1 mmol/L   Chloride 96 (L) 98 - 111 mmol/L   CO2 29 22 - 32 mmol/L   Glucose, Bld 82 70 - 99 mg/dL   BUN 43 (H) 8 - 23 mg/dL   Creatinine, Ser 6.08 (H) 0.44 - 1.00 mg/dL   Calcium 8.9 8.9 - 10.3 mg/dL   GFR calc non Af Amer 6 (L) >60 mL/min   GFR calc Af Amer 7 (L) >60 mL/min   Anion gap 13 5 - 15  CBC  Result Value Ref Range   WBC 16.0 (H) 4.0 - 10.5 K/uL   RBC 3.23 (L) 3.87 - 5.11 MIL/uL   Hemoglobin 11.8 (L) 12.0 - 15.0 g/dL   HCT 34.6 (L) 36 - 46 %   MCV 107.1 (H) 80.0 - 100.0 fL   MCH 36.5 (H) 26.0 - 34.0 pg   MCHC 34.1 30.0 - 36.0 g/dL   RDW 14.5 11.5 - 15.5 %   Platelets 216 150 - 400 K/uL   nRBC 0.0 0.0 - 0.2 %      Imaging:  CT ABDOMEN PELVIS W CONTRAST CLINICAL DATA:  Right groin pain. History of abdominal aortic aneurysm repair. Dialysis since 2016.  EXAM: CT ABDOMEN AND PELVIS WITH CONTRAST  TECHNIQUE: Multidetector CT imaging of the abdomen and pelvis was performed using the standard protocol following bolus administration  of intravenous contrast.  CONTRAST:  72mL OMNIPAQUE IOHEXOL 300 MG/ML  SOLN  COMPARISON:  05/21/2015  FINDINGS: Lower chest: Emphysema. Normal heart size without pericardial or pleural effusion. Multivessel coronary artery atherosclerosis.  Hepatobiliary: Too small to characterize subcentimeter anterior right hepatic lobe low-density lesion is unchanged.  7 mm gallstone.  No acute cholecystitis or biliary duct dilatation.  Pancreas: Normal, without mass or ductal dilatation.  Spleen: Normal in size, without focal abnormality.  Adrenals/Urinary Tract: Normal adrenal glands. Native renal artery atrophy, without hydronephrosis. Bladder wall thickening and surrounding edema, including on 69/2.  Stomach/Bowel: Normal stomach, without wall thickening. Postsurgical changes within the sigmoid. Colonic stool burden suggests constipation. Normal terminal ileum. Surgical sutures in the region of the cecum.  Normal small bowel.  Vascular/Lymphatic: Advanced aortic and branch vessel atherosclerosis. Status post aortic stent graft repair, with significant decrease in size of native sac. Surgical changes about the proximal right common iliac artery with stent graft within. There is also a stent in the right external iliac artery. Status post fem-fem bypass, patent. No abdominopelvic adenopathy.  Reproductive: Hysterectomy.  No adnexal mass.  Other: No significant free fluid.  Moderate pelvic floor laxity.  Subcutaneous edema which is most localized about the right labia, including on 80/2. Subtle fluid with peripheral enhancement including at maximally 1.5 cm on images 82-84 of series 2. Edema tracks caudal the right-side of the fem-fem bypass on 72/2, 22/5. More symmetric, presumably postoperative interstitial thickening about the fem-fem bypass insertion sites.  Musculoskeletal: Osteopenia. Prior sacroplasty. Severe T11 compression deformity, new since 05/21/2015. Mild ventral  canal encroachment. Similar grade 2 L5-S1 anterolisthesis.  IMPRESSION: 1. Edema likely representing cellulitis about the right groin and right labia. Peripherally enhancing fluid density for which developing tiny labial abscess is a concern. 2. Bladder wall thickening and surrounding edema, suspicious for cystitis. 3.  Possible constipation. 4. Coronary artery atherosclerosis. Aortic Atherosclerosis (ICD10-I70.0). Emphysema (ICD10-J43.9). 5. Cholelithiasis. 6. Osteopenia with T11 compression deformity, new since 05/21/2015.  Electronically Signed   By: Abigail Miyamoto M.D.   On: 10/18/2019 12:38 US Pelvis Complete CLINICAL DATA:  Right groin and previous area swelling, AAA graft 06/18/2019  EXAM: US PELVIS COMPLETE  TECHNIQUE: Ultrasound examination of the pelvic soft tissues was performed in the area of clinical concern.  COMPARISON:  CT pelvis, 02/17/2019  FINDINGS: Targeted ultrasound examination of the pelvis at the patient identified area of the midline and right pubic area between the right groin and labia reveals a superficially open wound with extensive surrounding soft tissue edema and an ill-defined hypoechoic region deep to the skin in the soft tissues of the right pubic area measuring 6.7 x 3.6 x 1.8 cm. A midline vascular graft, presumably the femoral femoral bypass graft seen on prior comparison CT, demonstrates internal Doppler flow.  IMPRESSION: 1. Targeted ultrasound examination of the pelvis at the patient identified area of the midline and right pubic area between the right groin and labia reveals a superficially open wound with extensive surrounding soft tissue edema and an ill-defined hypoechoic region deep to the skin in the soft tissues of the right pubic area measuring 6.7 x 3.6 x 1.8 cm. This is generally concerning for abscess in the setting of relatively recent prior surgery. Recommend contrast enhanced CT to further evaluate.  2. A vascular  graft is identified in the midline pubic superficial soft tissues, presumably the femoral-femoral bypass graft seen on prior comparison CT, with internal Doppler flow. This is not fully evaluated for graft patency by ultrasound.  Electronically Signed   By: Eddie Candle M.D.   On: 10/18/2019 11:41    Assessment/Plan:  1. Right labial cellulitis - patient with labial cellulitis, previously with a history of MRSA.  Currently on Vancomycin (changed from Cefazolin). Pharmacy to monitor dosing. Appears to be improving with small amount of drainage from previous abscess site. Will continue antibiotics. Also will utilize warm compress to abscess site (only 1.5 cm in size based on CT scan). WBC count trending downward.  Will hold off on surgical intervention as area appears to be improving.  2. Multiple comorbidities managed by primary team.    Will continue to follow.      Rubie Maid, MD [facility]

## 2019-10-20 DIAGNOSIS — N906 Unspecified hypertrophy of vulva: Secondary | ICD-10-CM

## 2019-10-20 DIAGNOSIS — R102 Pelvic and perineal pain: Secondary | ICD-10-CM

## 2019-10-20 LAB — CBC
HCT: 26.8 % — ABNORMAL LOW (ref 36.0–46.0)
Hemoglobin: 9.3 g/dL — ABNORMAL LOW (ref 12.0–15.0)
MCH: 36.3 pg — ABNORMAL HIGH (ref 26.0–34.0)
MCHC: 34.7 g/dL (ref 30.0–36.0)
MCV: 104.7 fL — ABNORMAL HIGH (ref 80.0–100.0)
Platelets: 187 10*3/uL (ref 150–400)
RBC: 2.56 MIL/uL — ABNORMAL LOW (ref 3.87–5.11)
RDW: 14.1 % (ref 11.5–15.5)
WBC: 9.8 10*3/uL (ref 4.0–10.5)
nRBC: 0 % (ref 0.0–0.2)

## 2019-10-20 LAB — HEPATITIS B SURFACE ANTIBODY, QUANTITATIVE: Hep B S AB Quant (Post): 3.8 m[IU]/mL — ABNORMAL LOW (ref >9.9)

## 2019-10-20 MED ORDER — EPOETIN ALFA 10000 UNIT/ML IJ SOLN
10000.0000 [IU] | INTRAMUSCULAR | Status: DC
  Filled 2019-10-20: qty 1

## 2019-10-20 NOTE — Evaluation (Signed)
Occupational Therapy Evaluation Patient Details Name: [name] MRN: [id number] DOB: 10-08-1950 Today's Date: 10/20/2019    History of Present Illness Pt is a 69 y.o. female presenting to hospital 7/21 with R groin pain (has graft from AAA in that site); open wound on lateral portion of R labia noted.  Pt admitted with cellulitis of labia and concern for possible abscess.  PMH includes anemia, AAA repair, anxiety, skin CA, CHF, COPD, ESRD HD T/R/Sat, diastolic HF, htn, chlamydia, VAIN II, chronic L foot ulcer L, chronic respiratory failure with hypoxia, PAD, alopecia, bladder sx, colon surgery, B fem-fem bypass graft, nasal septum surgery, sacroplasty 03/21/2019.   Clinical Impression   [name]" [name] was seen for OT evaluation this date. Prior to hospital admission, pt was modified independent in ADL management. She endorses using a transport chair for mobility, and sponge bathing 2/2 R foot wound. Prior to developing this wound ~1-2 months PTA, pt was using a RW and able to walk distances of ~300 feet. Pt lives with her husband in a 1 level home with a ramped entrance. Pt presents with generalized weakness, decreased activity tolerance, and increased pain which functionally limit her ability to perform ADL/self-care tasks. Pt currently requires CGA for functional mobility and transfers, as well as supervision for safety during toileting and standing grooming tasks. Pt would benefit from skilled OT services to address noted impairments and functional limitations (see below for any additional details) in order to maximize safety and independence while minimizing falls risk and caregiver burden. Upon hospital discharge, recommend HHOT to maximize pt safety and return to functional independence during meaningful occupations of daily life.      Follow Up Recommendations  Home health OT    Equipment Recommendations  3 in 1 bedside commode    Recommendations for Other Services        Precautions / Restrictions Precautions Precautions: Fall Precaution Comments: L UE AV fistula Restrictions Weight Bearing Restrictions: No      Mobility Bed Mobility Overal bed mobility: Modified Independent Bed Mobility: Sit to Supine           General bed mobility comments: Sit>sup, min increased effort/time to perform.  Transfers Overall transfer level: Needs assistance Equipment used: Rolling walker (2 wheeled) Transfers: Sit to/from Stand Sit to Stand: Min guard Stand pivot transfers: Min guard       General transfer comment: Min guard for fuctional mobility using RW.    Balance Overall balance assessment: Needs assistance Sitting-balance support: No upper extremity supported;Feet supported Sitting balance-Leahy Scale: Normal Sitting balance - Comments: steady sitting reaching outside BOS   Standing balance support: During functional activity;No upper extremity supported Standing balance-Leahy Scale: Fair Standing balance comment: Pt steady static standing, reaching within BOS during functional tasks. Fatigues quickly.                           ADL either performed or assessed with clinical judgement   ADL Overall ADL's : Needs assistance/impaired                                       General ADL Comments: Pt functionally limited by labial pain, generalized weakness and decreased activity tolerance. She requires CGA for safety during functional mobility using a RW, supervision for toileting/peri-care with min cues for safety with toilet transfer, and supervision during standing grooming tasks at  sink this date. Anticipate CGA to MIN A for LB ADL management in seated position to maximize safety.     Vision Baseline Vision/History: Wears glasses Wears Glasses: Reading only;Distance only (bifocals) Patient Visual Report: No change from baseline       Perception     Praxis      Pertinent Vitals/Pain Pain Assessment: No/denies  pain Faces Pain Scale: Hurts little more Pain Location: R labia Pain Descriptors / Indicators: Sore;Tender Pain Intervention(s): Limited activity within patient's tolerance;Monitored during session;Repositioned     Hand Dominance Right   Extremity/Trunk Assessment Upper Extremity Assessment Upper Extremity Assessment: Generalized weakness   Lower Extremity Assessment Lower Extremity Assessment: Generalized weakness   Cervical / Trunk Assessment Cervical / Trunk Assessment: Normal   Communication Communication Communication: No difficulties   Cognition Arousal/Alertness: Awake/alert Behavior During Therapy: WFL for tasks assessed/performed Overall Cognitive Status: Within Functional Limits for tasks assessed                                     General Comments  Scabed wound noted on R foot medial plantar surface, stable (no bleeding, oozing, etc.) after mobility.    Exercises Other Exercises Other Exercises: Pt and caregiver educated on falls prevention strategies, safe use of AE/DME for ADL management, safe transfer techniques, and routines modifications to support safety and functional independence upon hospital DC. Other Exercises: OT facilitates functional mobility, toilet transfer, toileting, and standing grooming tasks at sink. See ADL section for additional detail regarding occupational performance.   Shoulder Instructions      Home Living Family/patient expects to be discharged to:: Private residence Living Arrangements: Spouse/significant other Available Help at Discharge: Family;Available 24 hours/day Type of Home: House Home Access: Ramped entrance     Home Layout: One level     Bathroom Shower/Tub: Teacher, early years/pre: Standard     Home Equipment: Grab bars - tub/shower;Walker - 2 wheels;Wheelchair - manual          Prior Functioning/Environment Level of Independence: Needs assistance  Gait / Transfers Assistance  Needed: 2-3 weeks ago pt walking around 300 feet with RW but pt stopped walking d/t finding R foot sore; has been performing w/c level transfers since (modified independent with transfers; uses UE's>LE's to propel self) ADL's / Homemaking Assistance Needed: Assist for meals; husband assists some with laundry; pt performs cleaning; sponge bathes at baseline 2/2 R foot sore. Otherwise independent for ADL management.   Comments: 1 fall late 2020 (pt slipped out of w/c while leaning over to clean the tub)        OT Problem List: Decreased strength;Decreased coordination;Decreased activity tolerance;Decreased safety awareness;Pain;Impaired balance (sitting and/or standing);Decreased knowledge of use of DME or AE      OT Treatment/Interventions: Self-care/ADL training;Therapeutic exercise;Therapeutic activities;DME and/or AE instruction;Patient/family education;Balance training;Energy conservation    OT Goals(Current goals can be found in the care plan section) Acute Rehab OT Goals Patient Stated Goal: to go home OT Goal Formulation: With patient Time For Goal Achievement: 11/03/19 Potential to Achieve Goals: Good ADL Goals Pt Will Perform Grooming: with modified independence;standing Pt Will Perform Lower Body Dressing: sit to/from stand;with modified independence;with adaptive equipment (c LRAD PRN for improved safety and functional indep.) Pt Will Transfer to Toilet: ambulating;regular height toilet;with modified independence (c LRAD PRN for improved safety and functional indep.) Pt Will Perform Toileting - Clothing Manipulation and hygiene: sit to/from stand;with modified  independence (c LRAD PRN for improved safety and functional indep.)  OT Frequency: Min 1X/week   Barriers to D/C:            Co-evaluation              AM-PAC OT "6 Clicks" Daily Activity     Outcome Measure Help from another person eating meals?: None Help from another person taking care of personal grooming?:  A Little Help from another person toileting, which includes using toliet, bedpan, or urinal?: A Little Help from another person bathing (including washing, rinsing, drying)?: A Little Help from another person to put on and taking off regular upper body clothing?: None Help from another person to put on and taking off regular lower body clothing?: A Little 6 Click Score: 20   End of Session Equipment Utilized During Treatment: Gait belt;Rolling walker Nurse Communication: Mobility status;Other (comment) (Pt had BM during session.)  Activity Tolerance: Patient tolerated treatment well Patient left: in bed;with call bell/phone within reach;with bed alarm set;with family/visitor present  OT Visit Diagnosis: Other abnormalities of gait and mobility (R26.89);Muscle weakness (generalized) (M62.81);Pain Pain - Right/Left: Right Pain - part of body: Leg;Ankle and joints of foot                Time: 9432-7614 OT Time Calculation (min): 38 min Charges:  OT General Charges $OT Visit: 1 Visit OT Evaluation $OT Eval Moderate Complexity: 1 Mod OT Treatments $Self Care/Home Management : 23-37 mins  Shara Blazing, M.S., OTR/L Ascom: [phone] 10/20/19, 12:53 PM

## 2019-10-20 NOTE — Plan of Care (Signed)
Continuing with plan of care. 

## 2019-10-20 NOTE — Progress Notes (Signed)
Patient ID: [name], female   DOB: [birth year], 69 y.o.   MRN: [id number]     Subjective:    Patient feels well.  Vulvar pain decreased.  Objective:    Patient Vitals for the past 2 hrs:  BP Temp Temp src Pulse Resp SpO2  10/20/19 1228 (!) 132/49 97.7 F (36.5 C) Oral 86 14 95 %   No intake/output data recorded.  Labs: Results for orders placed or performed during the hospital encounter of 10/18/19 (from the past 24 hour(s))  CBC     Status: Abnormal   Collection Time: 10/20/19  5:59 AM  Result Value Ref Range   WBC 9.8 4.0 - 10.5 K/uL   RBC 2.56 (L) 3.87 - 5.11 MIL/uL   Hemoglobin 9.3 (L) 12.0 - 15.0 g/dL   HCT 26.8 (L) 36 - 46 %   MCV 104.7 (H) 80.0 - 100.0 fL   MCH 36.3 (H) 26.0 - 34.0 pg   MCHC 34.7 30.0 - 36.0 g/dL   RDW 14.1 11.5 - 15.5 %   Platelets 187 150 - 400 K/uL   nRBC 0.0 0.0 - 0.2 %    Medications    Current Discharge Medication List    CONTINUE these medications which have NOT CHANGED   Details  aspirin 325 MG tablet Take 325 mg by mouth daily.     B Complex-C-Folic Acid (DIALYVITE TABLET) TABS Take 1 tablet by mouth daily.    Biotin 5000 MCG CAPS Take 5,000 mcg by mouth daily at 6 PM.     ciprofloxacin (CIPRO) 250 MG tablet Take 250 mg by mouth daily at 6 PM.     cyclobenzaprine (FLEXERIL) 5 MG tablet Take 5 mg by mouth at bedtime.     Docusate Sodium 100 MG capsule Take 100 mg by mouth daily as needed for constipation.     fluticasone (FLONASE) 50 MCG/ACT nasal spray Place 2 sprays into both nostrils daily as needed for allergies.     gabapentin (NEURONTIN) 100 MG capsule Take 100 mg by mouth 3 (three) times daily.    levothyroxine (SYNTHROID, LEVOTHROID) 100 MCG tablet Take 1 tablet (100 mcg total) by mouth daily. Qty: 90 tablet, Refills: 3    lidocaine-prilocaine (EMLA) cream Apply 1 application topically as needed (for port access).     metoprolol tartrate (LOPRESSOR) 25 MG tablet Take 1 tablet (25 mg total) by mouth  daily. Qty: 90 tablet, Refills: 0   Comments: NEEDS OFFICE VISIT FOR FURTHER REFILLS    omeprazole (PRILOSEC) 40 MG capsule Take 40 mg by mouth daily.     sevelamer carbonate (RENVELA) 800 MG tablet Take 800 mg by mouth 3 (three) times daily with meals.     SSD 1 % cream Apply 1 application topically daily.    valACYclovir (VALTREX) 500 MG tablet Take 500 mg by mouth at bedtime.     albuterol (PROVENTIL HFA;VENTOLIN HFA) 108 (90 Base) MCG/ACT inhaler Inhale 2 puffs into the lungs every 6 (six) hours as needed for wheezing or shortness of breath.    Amino Acids-Protein Hydrolys (FEEDING SUPPLEMENT, PRO-STAT SUGAR FREE 64,) LIQD Take 30 mLs by mouth 2 (two) times daily.    lidocaine (LIDODERM) 5 % Place 1 patch onto the skin every 12 (twelve) hours. Remove & Discard patch within 12 hours or as directed by MD Qty: 10 patch, Refills: 0    Nutritional Supplements (FEEDING SUPPLEMENT, NEPRO CARB STEADY,) LIQD Take 237 mLs by mouth daily.    sodium chloride (OCEAN)  0.65 % SOLN nasal spray Place 1 spray into both nostrils daily as needed for congestion.          Vulvar examination reveals right labia to be enlarged and moderately tense.  There is no fluctuant areas. Discussion with Dr. Leslye Peer -he describes a decrease in erythema from yesterday.  Assessment:    Likely right labial cellulitis.  Appears more of a cellulitis than an abscess.  No areas to drain.  Patient has been afebrile on intravenous antibiotics with MRSA coverage.  Plan:    Continue antibiotics  Reevaluate tomorrow if continued improvement consider discharge on oral antibiotics with the expectation of slow resolution.  Plan of care discussed in detail with Dr. Leslye Peer.  Finis Bud, M.D. 10/20/2019 2:02 PM

## 2019-10-20 NOTE — Care Management Important Message (Signed)
Important Message  Patient Details  Name: AAIMA GADDIE MRN: [id number] Date of Birth: 1950-11-03   Medicare Important Message Given:        Darius Bump Jaskirat Zertuche 10/20/2019, 10:58 AM

## 2019-10-20 NOTE — Progress Notes (Signed)
Patient ID: [name], female   DOB: 1950-07-06, 69 y.o.   MRN: [id number] [facility] PROGRESS NOTE  [name] PNT:[id number] DOB: 09-23-1950 DOA: 10/18/2019 PCP: Jamie Hire, MD  HPI/Subjective: Patient still with some pain in the vaginal area.  She does not have it while she is lying flat.  Only a little spot of blood drainage from lower right labia.  Patient was initially admitted with cellulitis and possible abscess of the right labia.  Objective: Vitals:   10/19/19 2033 10/20/19 0522  BP: (!) 131/54 (!) 120/46  Pulse: 84 89  Resp: 16 16  Temp: 98 F (36.7 C) 98.5 F (36.9 C)  SpO2: 97% 98%    Intake/Output Summary (Last 24 hours) at 10/20/2019 1141 Last data filed at 10/20/2019 0900 Gross per 24 hour  Intake 157.23 ml  Output 768 ml  Net -610.77 ml   Filed Weights   10/18/19 0922  Weight: 47.2 kg    ROS: Review of Systems  Respiratory: Negative for shortness of breath.   Cardiovascular: Negative for chest pain.  Gastrointestinal: Negative for abdominal pain.  Musculoskeletal: Negative for joint pain.   Exam: Physical Exam HENT:     Head: Normocephalic.     Mouth/Throat:     Pharynx: No oropharyngeal exudate.  Eyes:     General: Lids are normal.     Conjunctiva/sclera: Conjunctivae normal.  Cardiovascular:     Rate and Rhythm: Normal rate and regular rhythm.     Heart sounds: Normal heart sounds, S1 normal and S2 normal.  Pulmonary:     Breath sounds: No decreased breath sounds, wheezing, rhonchi or rales.  Abdominal:     Palpations: Abdomen is soft.     Tenderness: There is no abdominal tenderness.  Musculoskeletal:     Right ankle: No swelling.     Left ankle: No swelling.  Skin:    General: Skin is warm.     Comments: Right labia fullness and tenderness to palpation.  Area that had a spot of drainage but nothing coming out currently.  Erythema has settled down.  Neurological:     Mental Status: She is alert and oriented to  person, place, and time.       Data Reviewed: Basic Metabolic Panel: Recent Labs  Lab 10/18/19 1051 10/19/19 0458  NA 141 138  K 3.9 4.7  CL 96* 96*  CO2 28 29  GLUCOSE 85 82  BUN 30* 43*  CREATININE 4.87* 6.08*  CALCIUM 9.1 8.9   Liver Function Tests: Recent Labs  Lab 10/18/19 1051  AST 22  ALT 15  ALKPHOS 143*  BILITOT 1.3*  PROT 6.5  ALBUMIN 3.1*   CBC: Recent Labs  Lab 10/18/19 1051 10/18/19 2100 10/19/19 0458 10/20/19 0559  WBC 14.7* 16.0* 14.3* 9.8  NEUTROABS 12.1*  --   --   --   HGB 10.2* 11.8* 9.9* 9.3*  HCT 29.7* 34.6* 29.5* 26.8*  MCV 105.7* 107.1* 107.3* 104.7*  PLT 214 216 221 187    CBG: Recent Labs  Lab 10/19/19 1333  GLUCAP 110*    Recent Results (from the past 240 hour(s))  SARS Coronavirus 2 by RT PCR (hospital order, performed in [facility] hospital lab) Nasopharyngeal Nasopharyngeal Swab     Status: None   Collection Time: 10/18/19 12:31 PM   Specimen: Nasopharyngeal Swab  Result Value Ref Range Status   SARS Coronavirus 2 NEGATIVE NEGATIVE Final    Comment: (NOTE) SARS-CoV-2 target nucleic acids are NOT  DETECTED.  The SARS-CoV-2 RNA is generally detectable in upper and lower respiratory specimens during the acute phase of infection. The lowest concentration of SARS-CoV-2 viral copies this assay can detect is 250 copies / mL. A negative result does not preclude SARS-CoV-2 infection and should not be used as the sole basis for treatment or other patient management decisions.  A negative result may occur with improper specimen collection / handling, submission of specimen other than nasopharyngeal swab, presence of viral mutation(s) within the areas targeted by this assay, and inadequate number of viral copies (<250 copies / mL). A negative result must be combined with clinical observations, patient history, and epidemiological information.  Fact Sheet for Patients:   StrictlyIdeas.no  Fact Sheet  for Healthcare Providers: BankingDealers.co.za  This test is not yet approved or  cleared by the [place] FDA and has been authorized for detection and/or diagnosis of SARS-CoV-2 by FDA under an Emergency Use Authorization (EUA).  This EUA will remain in effect (meaning this test can be used) for the duration of the COVID-19 declaration under Section 564(b)(1) of the Act, 21 U.S.C. section 360bbb-3(b)(1), unless the authorization is terminated or revoked sooner.  Performed at [facility], [address] [place], [place] [address]   MRSA PCR Screening     Status: Abnormal   Collection Time: 10/19/19 12:30 AM   Specimen: Nasal Mucosa; Nasopharyngeal  Result Value Ref Range Status   MRSA by PCR POSITIVE (A) NEGATIVE Final    Comment:        The GeneXpert MRSA Assay (FDA approved for NASAL specimens only), is one component of a comprehensive MRSA colonization surveillance program. It is not intended to diagnose MRSA infection nor to guide or monitor treatment for MRSA infections. RESULT CALLED TO, READ BACK BY AND VERIFIED WITH: [name] AT 0211 on 10/19/2019 [facility]. Performed at [facility], [address] [place], [place] [address]      Studies: CT ABDOMEN PELVIS W CONTRAST  Result Date: 10/18/2019 CLINICAL DATA:  Right groin pain. History of abdominal aortic aneurysm repair. Dialysis since 2016. EXAM: CT ABDOMEN AND PELVIS WITH CONTRAST TECHNIQUE: Multidetector CT imaging of the abdomen and pelvis was performed using the standard protocol following bolus administration of intravenous contrast. CONTRAST:  67mL OMNIPAQUE IOHEXOL 300 MG/ML  SOLN COMPARISON:  05/21/2015 FINDINGS: Lower chest: Emphysema. Normal heart size without pericardial or pleural effusion. Multivessel coronary artery atherosclerosis. Hepatobiliary: Too small to characterize subcentimeter anterior right hepatic lobe low-density lesion is unchanged. 7 mm  gallstone.  No acute cholecystitis or biliary duct dilatation. Pancreas: Normal, without mass or ductal dilatation. Spleen: Normal in size, without focal abnormality. Adrenals/Urinary Tract: Normal adrenal glands. Native renal artery atrophy, without hydronephrosis. Bladder wall thickening and surrounding edema, including on 69/2. Stomach/Bowel: Normal stomach, without wall thickening. Postsurgical changes within the sigmoid. Colonic stool burden suggests constipation. Normal terminal ileum. Surgical sutures in the region of the cecum. Normal small bowel. Vascular/Lymphatic: Advanced aortic and branch vessel atherosclerosis. Status post aortic stent graft repair, with significant decrease in size of native sac. Surgical changes about the proximal right common iliac artery with stent graft within. There is also a stent in the right external iliac artery. Status post fem-fem bypass, patent. No abdominopelvic adenopathy. Reproductive: Hysterectomy.  No adnexal mass. Other: No significant free fluid.  Moderate pelvic floor laxity. Subcutaneous edema which is most localized about the right labia, including on 80/2. Subtle fluid with peripheral enhancement including at maximally 1.5 cm on images 82-84  of series 2. Edema tracks caudal the right-side of the fem-fem bypass on 72/2, 22/5. More symmetric, presumably postoperative interstitial thickening about the fem-fem bypass insertion sites. Musculoskeletal: Osteopenia. Prior sacroplasty. Severe T11 compression deformity, new since 05/21/2015. Mild ventral canal encroachment. Similar grade 2 L5-S1 anterolisthesis. IMPRESSION: 1. Edema likely representing cellulitis about the right groin and right labia. Peripherally enhancing fluid density for which developing tiny labial abscess is a concern. 2. Bladder wall thickening and surrounding edema, suspicious for cystitis. 3.  Possible constipation. 4. Coronary artery atherosclerosis. Aortic Atherosclerosis (ICD10-I70.0).  Emphysema (ICD10-J43.9). 5. Cholelithiasis. 6. Osteopenia with T11 compression deformity, new since 05/21/2015. Electronically Signed   By: Abigail Miyamoto M.D.   On: 10/18/2019 12:38   US Venous Img Lower Bilateral (DVT)  Result Date: 10/19/2019 CLINICAL DATA:  Bilateral lower extremity pain. Remote history of DVT (1987). History of PAD, post bilateral lower extremity arterial stent placement. Evaluate for acute or chronic DVT. EXAM: BILATERAL LOWER EXTREMITY VENOUS DOPPLER ULTRASOUND TECHNIQUE: Gray-scale sonography with graded compression, as well as color Doppler and duplex ultrasound were performed to evaluate the lower extremity deep venous systems from the level of the common femoral vein and including the common femoral, femoral, profunda femoral, popliteal and calf veins including the posterior tibial, peroneal and gastrocnemius veins when visible. The superficial great saphenous vein was also interrogated. Spectral Doppler was utilized to evaluate flow at rest and with distal augmentation maneuvers in the common femoral, femoral and popliteal veins. COMPARISON:  Right lower extremity venous Doppler ultrasound-04/09/2015 (negative) FINDINGS: RIGHT LOWER EXTREMITY Common Femoral Vein: No evidence of thrombus. Normal compressibility, respiratory phasicity and response to augmentation. Saphenofemoral Junction: No evidence of thrombus. Normal compressibility and flow on color Doppler imaging. Profunda Femoral Vein: No evidence of thrombus. Normal compressibility and flow on color Doppler imaging. Femoral Vein: No evidence of thrombus. Normal compressibility, respiratory phasicity and response to augmentation. Popliteal Vein: No evidence of thrombus. Normal compressibility, respiratory phasicity and response to augmentation. Calf Veins: No evidence of thrombus. Normal compressibility and flow on color Doppler imaging. Superficial Great Saphenous Vein: No evidence of thrombus. Normal compressibility. Venous  Reflux:  None. Other Findings:  None. LEFT LOWER EXTREMITY Common Femoral Vein: No evidence of thrombus. Normal compressibility, respiratory phasicity and response to augmentation. Saphenofemoral Junction: No evidence of thrombus. Normal compressibility and flow on color Doppler imaging. Profunda Femoral Vein: No evidence of thrombus. Normal compressibility and flow on color Doppler imaging. Femoral Vein: No evidence of thrombus. Normal compressibility, respiratory phasicity and response to augmentation. Popliteal Vein: No evidence of thrombus. Normal compressibility, respiratory phasicity and response to augmentation. Calf Veins: No evidence of thrombus. Normal compressibility and flow on color Doppler imaging. Superficial Great Saphenous Vein: No evidence of thrombus. Normal compressibility. Venous Reflux:  None. Other Findings:  None. IMPRESSION: No evidence of acute or chronic DVT within either lower extremity. Electronically Signed   By: Sandi Mariscal M.D.   On: 10/19/2019 16:30    Scheduled Meds: . aspirin EC  325 mg Oral Daily  . Chlorhexidine Gluconate Cloth  6 each Topical Q0600  . cyclobenzaprine  5 mg Oral QHS  . feeding supplement (NEPRO CARB STEADY)  237 mL Oral Daily  . feeding supplement (PRO-STAT SUGAR FREE 64)  30 mL Oral BID  . levothyroxine  100 mcg Oral QAC breakfast  . lidocaine  1 patch Transdermal Q12H  . metoprolol tartrate  25 mg Oral q1800  . multivitamin  1 tablet Oral Daily  . mupirocin ointment  1  application Nasal BID  . pantoprazole  40 mg Oral Daily  . sevelamer carbonate  800 mg Oral TID WC  . sodium chloride flush  3 mL Intravenous Q12H  . valACYclovir  500 mg Oral QHS   Continuous Infusions: . sodium chloride 30 mL/hr at 10/19/19 1515  . piperacillin-tazobactam (ZOSYN)  IV 2.25 g (10/20/19 0557)  . vancomycin Stopped (10/19/19 1352)    Assessment/Plan:  1. Cellulitis and possible abscess of the right labia.  Patient on vancomycin and Zosyn currently.   Appreciate gynecology follow-up by Dr. Marcelline Mates.  Continue warm soaks.  Patient still has fullness in that right labia.  Continue IV antibiotics today.   2. Leg pain bilaterally.  Sonogram negative for DVT.  3. End-stage renal disease on dialysis.  Will have dialysis tomorrow. 4. Chronic respiratory failure with hypoxia on oxygen. 5. Hypothyroidism unspecified on levothyroxine.  6. Chronic diastolic congestive heart failure.  No current signs of heart failure.  Dialysis to remove fluid.  7. Weakness.  Mostly wheelchair-bound.  Able to transfer herself to the commode today.    Code Status:     Code Status Orders  (From admission, onward)         Start     Ordered   10/18/19 1648  Full code  Continuous        10/18/19 1649        Code Status History    Date Active Date Inactive Code Status Order ID Comments User Context   07/16/2015 0235 07/23/2015 1839 Full Code [id number]  Saundra Shelling, MD Inpatient   06/15/2015 1837 06/24/2015 1933 Full Code [id number]  Bettey Costa, MD Inpatient   05/20/2015 1113 06/01/2015 2121 Full Code [id number]  Fritzi Mandes, MD Inpatient   04/09/2015 1024 04/22/2015 2115 Full Code [id number]  Theodoro Grist, MD Inpatient   Advance Care Planning Activity     Family Communication: Husband at the bedside Disposition Plan: Status is: Inpatient  Dispo: The patient is from: Home              Anticipated d/c is to: Home              Anticipated d/c date is: Potential 10/21/2019 versus 10/22/2019 depending on whether or not gynecology wants to do an I&D or just continue with antibiotics.              Patient currently being treated for vulvar cellulitis and possible abscess  Consultants:  Gynecology  Antibiotics:  Vancomycin  Zosyn  Time spent: 27 minutes  Ranvir Renovato [employer]

## 2019-10-20 NOTE — Progress Notes (Signed)
Pharmacy Antibiotic Note  [name] is a 69 y.o. 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  Pharmacy was consulted for vancomycin and Zosyn dosing in a patient with ESRD on HD normally on TTS. This is day  # 3 of broad-spectrum antibiotics.  Plan:  1) vancomycin   Continue vancomycin 500 mg IV Q T-Th-Sat (per current HD schedule)   1st post-HD dose 7/22  Next HD 7/24  Vancomycin level prior to 3rd HD session  2) Zosyn   Continue 2.25 grams IV every 8 hours  Height: 5\' 4"  (162.6 cm) Weight: 47.2 kg (104 lb) IBW/kg (Calculated) : 54.7  Temp (24hrs), Avg:98.2 F (36.8 C), Min:98 F (36.7 C), Max:98.6 F (37 C)  Recent Labs  Lab 10/18/19 1051 10/18/19 2100 10/19/19 0458 10/20/19 0559  WBC 14.7* 16.0* 14.3* 9.8  CREATININE 4.87*  --  6.08*  --   LATICACIDVEN 1.1  --   --   --     Estimated Creatinine Clearance: 6.4 mL/min (A) (by C-G formula based on SCr of 6.08 mg/dL (H)).    Allergies  Allergen Reactions  . Asa [Aspirin] Other (See Comments)    Patient not sure why this is listed, she takes 325 mg Aspirin once daily  . 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 Plavix [Clopidogrel] Other (See Comments)    unknown    Antimicrobials this admission: Vancomycin 7/21  >>  Zosyn 7/22 >>   Microbiology results: 7/22 MRSA PCR: POSITIVE 7/21 SARS CoV-2: negative  Thank you for allowing pharmacy to be a part of this patient's care.  Dallie Piles, PharmD, BCPS Clinical Pharmacist 10/20/2019 7:00 AM

## 2019-10-20 NOTE — TOC Initial Note (Addendum)
Transition of Care [facility]) - Initial/Assessment Note    Patient Details  Name: [name] MRN: [id number] Date of Birth: 1950-09-03  Transition of Care [facility]) CM/SW Contact:    Candie Chroman, LCSW Phone Number: 10/20/2019, 2:03 PM  Clinical Narrative:  CSW met with patient. No supports at bedside. CSW introduced role and explained that PT recommendations would be discussed. Patient not interested in home health therapy but agreeable to a home health nurse to check the wound on her labia once a week. Provided CMS scores for home health agencies that serve her zip code. [facility] preference is [facility]. CSW left voicemail for [facility]. No further concerns. CSW encouraged patient and her husband to contact CSW as needed. CSW will continue to follow patient for support and facilitate return home when stable.     2:32 pm: Received call back from Melba at [facility]. She will ask office about nursing availability and call CSW back.            3:29 pm: Patient has been accepted by [facility] for home health nursing needs. They can follow up as early as Monday. Patient is aware and agreeable.  Expected Discharge Plan: [place] Barriers to Discharge: Continued Medical Work up   Patient Goals and CMS Choice   CMS Medicare.gov Compare Post Acute Care list provided to:: Patient (Husband at bedside.)    Expected Discharge Plan and Services Expected Discharge Plan: [place] Choice: [place] arrangements for the past 2 months: Single Family Home                                      Prior Living Arrangements/Services Living arrangements for the past 2 months: Single Family Home Lives with:: Spouse Patient language and need for interpreter reviewed:: Yes Do you feel safe going back to the place where you live?: Yes      Need for Family Participation in  Patient Care: Yes (Comment) Care giver support system in place?: Yes (comment) Current home services: DME Criminal Activity/Legal Involvement Pertinent to Current Situation/Hospitalization: No - Comment as needed  Activities of Daily Living Home Assistive Devices/Equipment: Environmental consultant (specify type), Wheelchair, Eyeglasses, Oxygen ADL Screening (condition at time of admission) Patient's cognitive ability adequate to safely complete daily activities?: Yes Is the patient deaf or have difficulty hearing?: No Does the patient have difficulty seeing, even when wearing glasses/contacts?: No Does the patient have difficulty concentrating, remembering, or making decisions?: No Patient able to express need for assistance with ADLs?: Yes Does the patient have difficulty dressing or bathing?: No Independently performs ADLs?: Yes (appropriate for developmental age) Does the patient have difficulty walking or climbing stairs?: Yes Weakness of Legs: Both Weakness of Arms/Hands: None  Permission Sought/Granted Permission sought to share information with : Facility Sport and exercise psychologist, Family Supports Permission granted to share information with : Yes, Verbal Permission Granted  Share Information with NAME: Shefali Ng  Permission granted to share info w AGENCY: [facility] granted to share info w Relationship: Spouse  Permission granted to share info w Contact Information: [phone]  Emotional Assessment Appearance:: Appears stated age Attitude/Demeanor/Rapport: Engaged, Gracious Affect (typically observed): Accepting, Appropriate, Calm, Pleasant Orientation: : Oriented to Self, Oriented to Place, Oriented to  Time, Oriented to Situation Alcohol /  Substance Use: Not Applicable Psych Involvement: No (comment)  Admission diagnosis:  Labial abscess [N76.4] Cellulitis of labia [N76.2] Cellulitis, unspecified cellulitis site [L03.90] Patient Active Problem List   Diagnosis  Date Noted  . Labial abscess   . Pain in both lower extremities   . Chronic diastolic CHF (congestive heart failure) ([place])   . Cellulitis of labia 10/18/2019  . History of MRSA infection 10/18/2019  . Renal disease 12/28/2018  . Chronic foot ulcer, left, with fat layer exposed ([place]) 03/08/2018  . Leg wound, left, initial encounter 05/19/2017  . Chronic respiratory failure with hypoxia ([place]) 03/01/2017  . PAD (peripheral artery disease) ([place]) 01/28/2017  . Aneurysm ([place]) 12/16/2016  . Complication from renal dialysis device 09/21/2016  . Encounter for continuous venovenous hemodiafiltration (CVVHD) ([place]) 09/25/2015  . ESRD (end stage renal disease) ([place]) 09/04/2015  . Infection due to vancomycin resistant Enterococcus (VRE) 07/01/2015  . Vascular device, implant, or graft infection or inflammation, subsequent encounter 07/01/2015  . Anemia 06/15/2015  . Complication of vascular access for dialysis 04/22/2015  . Thrombocytopenia ([place]) 04/22/2015  . Anemia of chronic disease 04/22/2015  . H/O blood transfusion reaction 04/22/2015  . HIT (heparin-induced thrombocytopenia) ([place]) 04/22/2015  . Weakness 04/22/2015  . Malnutrition of moderate degree 04/19/2015  . Acute pulmonary edema with congestive heart failure ([place]) 04/09/2015  . Leukocytosis 04/09/2015  . Elevated troponin 04/09/2015  . Acute diastolic CHF (congestive heart failure) ([place]) 04/09/2015  . Pneumonia due to Pseudomonas species ([place]) 03/01/2015  . Chronic obstructive pulmonary disease, unspecified ([place]) 02/26/2015  . S/P dialysis catheter insertion ([place]) 02/22/2015  . Endoleak post (EVAR) endovascular aneurysm repair ([place]) 02/16/2015  . COPD exacerbation ([place]) 01/03/2015  . Alopecia 09/24/2014  . VAIN II (vaginal intraepithelial neoplasia grade II) 11/22/2013    Class: History of  . Postmenopausal HRT (hormone replacement therapy) 09/18/2013  . Pernicious anemia 09/18/2013  . Elevated liver function tests 09/14/2011  .  Hypothyroidism 05/30/2010  . HLD (hyperlipidemia) 05/30/2010  . Essential hypertension 05/30/2010  . Ruptured abdominal aortic aneurysm (AAA) ([place]) 05/30/2010  . Chronic obstructive pulmonary disease ([place]) 05/30/2010  . GERD 05/30/2010   PCP:  Baxter Hire, MD Pharmacy:   [place], [place] - [address] [address] Phone: [phone] Fax: [phone]     Social Determinants of Health (SDOH) Interventions    Readmission Risk Interventions No flowsheet data found.

## 2019-10-20 NOTE — Evaluation (Signed)
Physical Therapy Evaluation Patient Details Name: [name] MRN: [id number] DOB: [birth year] Today's Date: 10/20/2019   History of Present Illness  Pt is a 69 y.o. female presenting to hospital 7/21 with R groin pain (has graft from AAA in that site); open wound on lateral portion of R labia noted.  Pt admitted with cellulitis of labia and concern for possible abscess.  PMH includes anemia, AAA repair, anxiety, skin CA, CHF, COPD, ESRD HD T/R/Sat, diastolic HF, htn, chlamydia, VAIN II, chronic L foot ulcer L, chronic respiratory failure with hypoxia, PAD, alopecia, bladder sx, colon surgery, B fem-fem bypass graft, nasal septum surgery, sacroplasty 03/21/2019.  Clinical Impression  Prior to hospital admission, pt has been modified independent with w/c level transfers last 2-3 weeks (was walking with RW prior to that but pt stopped walking to allow R foot sore to heal which pt reports is almost healed); lives with her husband in 1 level home with ramp to enter.  Currently pt is modified independent semi-supine to sitting edge of bed and CGA with transfer bed to recliner with UE support on stable furniture.  Generalized weakness noted.  Pt would benefit from skilled PT to address noted impairments and functional limitations (see below for any additional details).  Upon hospital discharge, pt would benefit from [facility].    Follow Up Recommendations Home health PT    Equipment Recommendations  None recommended by PT (pt has walker and manual w/c at home)    Recommendations for Other Services OT consult     Precautions / Restrictions Precautions Precautions: Fall Precaution Comments: L UE AV fistula Restrictions Weight Bearing Restrictions: No      Mobility  Bed Mobility Overal bed mobility: Modified Independent             General bed mobility comments: Semi-supine to sitting edge of bed with mild increased effort  Transfers Overall transfer level: Needs assistance Equipment  used: None Transfers: Sit to/from [facility] Sit to Stand: Min guard Stand pivot transfers: Min guard       General transfer comment: mild increased effort to stand and take steps bed to recliner with UE support on stable furniture  Ambulation/Gait             General Gait Details: Deferred (pt reports being non-ambulatory last 2-3 weeks to allow for R foot sore to heal)  Stairs            Wheelchair Mobility    Modified Rankin (Stroke Patients Only)       Balance Overall balance assessment: Needs assistance Sitting-balance support: No upper extremity supported;Feet supported Sitting balance-Leahy Scale: Normal Sitting balance - Comments: steady sitting reaching outside BOS   Standing balance support: Single extremity supported Standing balance-Leahy Scale: Fair Standing balance comment: pt requiring at least single UE support with standing/transfer                             Pertinent Vitals/Pain Pain Assessment: Faces Faces Pain Scale: Hurts little more Pain Location: R labia Pain Descriptors / Indicators: Sore;Tender Pain Intervention(s): Limited activity within patient's tolerance;Monitored during session;Repositioned  Vitals (HR and O2 on room air) stable and WFL throughout treatment session.    Home Living Family/patient expects to be discharged to:: Private residence Living Arrangements: Spouse/significant other Available Help at Discharge: Family;Available 24 hours/day Type of Home: House Home Access: Ramped entrance     Home Layout: One level Home Equipment: Grab  bars - tub/shower;Walker - 2 wheels;Wheelchair - manual      Prior Function Level of Independence: Needs assistance   Gait / Transfers Assistance Needed: 2-3 weeks ago pt walking around 300 feet with RW but pt stopped walking d/t finding R foot sore; has been performing w/c level transfers since (modified independent with transfers; uses UE's>LE's to  propel self)  ADL's / Homemaking Assistance Needed: Assist for meals; husband assists some with laundry; pt performs cleaning; stays active  Comments: 1 fall late 2020 (pt slipped out of w/c while leaning over to clean the tub)     Hand Dominance        Extremity/Trunk Assessment   Upper Extremity Assessment Upper Extremity Assessment: Generalized weakness    Lower Extremity Assessment Lower Extremity Assessment: Generalized weakness    Cervical / Trunk Assessment Cervical / Trunk Assessment: Normal  Communication   Communication: No difficulties  Cognition Arousal/Alertness: Awake/alert Behavior During Therapy: WFL for tasks assessed/performed Overall Cognitive Status: Within Functional Limits for tasks assessed                                        General Comments General comments (skin integrity, edema, etc.): scab noted over R foot medial plantar surface sore; small blister noted tip of R great toe near toenail (pt reports she gets those); dressing over R lower leg wound noted.  Nursing cleared pt for participation in physical therapy.  Pt agreeable to PT session.  Pt's husband present during session.    Exercises     Assessment/Plan    PT Assessment Patient needs continued PT services  PT Problem List Decreased strength;Decreased balance;Decreased mobility;Pain;Decreased skin integrity       PT Treatment Interventions DME instruction;Functional mobility training;Therapeutic activities;Therapeutic exercise;Balance training;Patient/family education    PT Goals (Current goals can be found in the Care Plan section)  Acute Rehab PT Goals Patient Stated Goal: to go home PT Goal Formulation: With patient Time For Goal Achievement: 11/03/19 Potential to Achieve Goals: Good    Frequency Min 2X/week   Barriers to discharge        Co-evaluation               AM-PAC PT "6 Clicks" Mobility  Outcome Measure Help needed turning from your  back to your side while in a flat bed without using bedrails?: None Help needed moving from lying on your back to sitting on the side of a flat bed without using bedrails?: None Help needed moving to and from a bed to a chair (including a wheelchair)?: A Little Help needed standing up from a chair using your arms (e.g., wheelchair or bedside chair)?: A Little Help needed to walk in hospital room?: A Little Help needed climbing 3-5 steps with a railing? : A Lot 6 Click Score: 19    End of Session Equipment Utilized During Treatment: Gait belt Activity Tolerance: Patient tolerated treatment well Patient left: in chair;with call bell/phone within reach;with chair alarm set;with family/visitor present;Other (comment) (B heels floating via pillow support) Nurse Communication: Mobility status;Precautions PT Visit Diagnosis: Other abnormalities of gait and mobility (R26.89);Muscle weakness (generalized) (M62.81);Difficulty in walking, not elsewhere classified (R26.2)    Time: 0240-9735 PT Time Calculation (min) (ACUTE ONLY): 27 min   Charges:   PT Evaluation $PT Eval Low Complexity: 1 Low PT Treatments $Therapeutic Activity: 8-22 mins       Leitha Bleak,  PT 10/20/19, 11:39 AM

## 2019-10-20 NOTE — Progress Notes (Signed)
[facility]  ROUNDING NOTE   Subjective:   Hemodialysis treatment yesterday. Tolerated treatment well.   Husband at bedside.   Patient states her swelling has improved.    Objective:  Vital signs in last 24 hours:  Temp:  [98 F (36.7 C)-98.6 F (37 C)] 98.5 F (36.9 C) (07/23 0522) Pulse Rate:  [84-103] 89 (07/23 0522) Resp:  [13-29] 16 (07/23 0522) BP: (93-148)/(46-62) 120/46 (07/23 0522) SpO2:  [97 %-99 %] 98 % (07/23 0522)  Weight change:  Filed Weights   10/18/19 0922  Weight: 47.2 kg    Intake/Output: I/O last 3 completed shifts: In: 677.2 [P.O.:220; I.V.:7.1; IV Piggyback:450.1] Out: 768 [Other:768]   Intake/Output this shift:  No intake/output data recorded.  Physical Exam: General: NAD, laying in bed  Head: Normocephalic, atraumatic. Moist oral mucosal membranes  Eyes: Anicteric, PERRL  Neck: Supple, trachea midline  Lungs:  Clear to auscultation  Heart: Regular rate and rhythm  Abdomen:  Soft, nontender,   Extremities: no peripheral edema.  Neurologic: Nonfocal, moving all four extremities  Skin: No lesions  Access:  Left AVG    Basic Metabolic Panel: Recent Labs  Lab 10/18/19 1051 10/19/19 0458  NA 141 138  K 3.9 4.7  CL 96* 96*  CO2 28 29  GLUCOSE 85 82  BUN 30* 43*  CREATININE 4.87* 6.08*  CALCIUM 9.1 8.9    Liver Function Tests: Recent Labs  Lab 10/18/19 1051  AST 22  ALT 15  ALKPHOS 143*  BILITOT 1.3*  PROT 6.5  ALBUMIN 3.1*   No results for input(s): LIPASE, AMYLASE in the last 168 hours. No results for input(s): AMMONIA in the last 168 hours.  CBC: Recent Labs  Lab 10/18/19 1051 10/18/19 2100 10/19/19 0458 10/20/19 0559  WBC 14.7* 16.0* 14.3* 9.8  NEUTROABS 12.1*  --   --   --   HGB 10.2* 11.8* 9.9* 9.3*  HCT 29.7* 34.6* 29.5* 26.8*  MCV 105.7* 107.1* 107.3* 104.7*  PLT 214 216 221 187    Cardiac Enzymes: No results for input(s): CKTOTAL, CKMB, CKMBINDEX, TROPONINI in the last 168  hours.  BNP: Invalid input(s): POCBNP  CBG: Recent Labs  Lab 10/19/19 1333  GLUCAP 110*    Microbiology: Results for orders placed or performed during the hospital encounter of 10/18/19  SARS Coronavirus 2 by RT PCR (hospital order, performed in [facility] hospital lab) Nasopharyngeal Nasopharyngeal Swab     Status: None   Collection Time: 10/18/19 12:31 PM   Specimen: Nasopharyngeal Swab  Result Value Ref Range Status   SARS Coronavirus 2 NEGATIVE NEGATIVE Final    Comment: (NOTE) SARS-CoV-2 target nucleic acids are NOT DETECTED.  The SARS-CoV-2 RNA is generally detectable in upper and lower respiratory specimens during the acute phase of infection. The lowest concentration of SARS-CoV-2 viral copies this assay can detect is 250 copies / mL. A negative result does not preclude SARS-CoV-2 infection and should not be used as the sole basis for treatment or other patient management decisions.  A negative result may occur with improper specimen collection / handling, submission of specimen other than nasopharyngeal swab, presence of viral mutation(s) within the areas targeted by this assay, and inadequate number of viral copies (<250 copies / mL). A negative result must be combined with clinical observations, patient history, and epidemiological information.  Fact Sheet for Patients:   StrictlyIdeas.no  Fact Sheet for Healthcare Providers: BankingDealers.co.za  This test is not yet approved or  cleared by the [place]  FDA and has been authorized for detection and/or diagnosis of SARS-CoV-2 by FDA under an Emergency Use Authorization (EUA).  This EUA will remain in effect (meaning this test can be used) for the duration of the COVID-19 declaration under Section 564(b)(1) of the Act, 21 U.S.C. section 360bbb-3(b)(1), unless the authorization is terminated or revoked sooner.  Performed at [facility], [address] [place], [place] [address]   MRSA PCR Screening     Status: Abnormal   Collection Time: 10/19/19 12:30 AM   Specimen: Nasal Mucosa; Nasopharyngeal  Result Value Ref Range Status   MRSA by PCR POSITIVE (A) NEGATIVE Final    Comment:        The GeneXpert MRSA Assay (FDA approved for NASAL specimens only), is one component of a comprehensive MRSA colonization surveillance program. It is not intended to diagnose MRSA infection nor to guide or monitor treatment for MRSA infections. RESULT CALLED TO, READ BACK BY AND VERIFIED WITH: MARIA JODLOMAN AT 0211 on 10/19/2019 [facility]. Performed at [facility], [address] [place], [place] [address]     Coagulation Studies: Recent Labs    10/18/19 1051  LABPROT 13.8  INR 1.1    Urinalysis: No results for input(s): COLORURINE, LABSPEC, PHURINE, GLUCOSEU, HGBUR, BILIRUBINUR, KETONESUR, PROTEINUR, UROBILINOGEN, NITRITE, LEUKOCYTESUR in the last 72 hours.  Invalid input(s): APPERANCEUR    Imaging: CT ABDOMEN PELVIS W CONTRAST  Result Date: 10/18/2019 CLINICAL DATA:  Right groin pain. History of abdominal aortic aneurysm repair. Dialysis since 2016. EXAM: CT ABDOMEN AND PELVIS WITH CONTRAST TECHNIQUE: Multidetector CT imaging of the abdomen and pelvis was performed using the standard protocol following bolus administration of intravenous contrast. CONTRAST:  70mL OMNIPAQUE IOHEXOL 300 MG/ML  SOLN COMPARISON:  05/21/2015 FINDINGS: Lower chest: Emphysema. Normal heart size without pericardial or pleural effusion. Multivessel coronary artery atherosclerosis. Hepatobiliary: Too small to characterize subcentimeter anterior right hepatic lobe low-density lesion is unchanged. 7 mm gallstone.  No acute cholecystitis or biliary duct dilatation. Pancreas: Normal, without mass or ductal dilatation. Spleen: Normal in size, without focal abnormality. Adrenals/Urinary Tract: Normal adrenal glands. Native renal artery atrophy, without  hydronephrosis. Bladder wall thickening and surrounding edema, including on 69/2. Stomach/Bowel: Normal stomach, without wall thickening. Postsurgical changes within the sigmoid. Colonic stool burden suggests constipation. Normal terminal ileum. Surgical sutures in the region of the cecum. Normal small bowel. Vascular/Lymphatic: Advanced aortic and branch vessel atherosclerosis. Status post aortic stent graft repair, with significant decrease in size of native sac. Surgical changes about the proximal right common iliac artery with stent graft within. There is also a stent in the right external iliac artery. Status post fem-fem bypass, patent. No abdominopelvic adenopathy. Reproductive: Hysterectomy.  No adnexal mass. Other: No significant free fluid.  Moderate pelvic floor laxity. Subcutaneous edema which is most localized about the right labia, including on 80/2. Subtle fluid with peripheral enhancement including at maximally 1.5 cm on images 82-84 of series 2. Edema tracks caudal the right-side of the fem-fem bypass on 72/2, 22/5. More symmetric, presumably postoperative interstitial thickening about the fem-fem bypass insertion sites. Musculoskeletal: Osteopenia. Prior sacroplasty. Severe T11 compression deformity, new since 05/21/2015. Mild ventral canal encroachment. Similar grade 2 L5-S1 anterolisthesis. IMPRESSION: 1. Edema likely representing cellulitis about the right groin and right labia. Peripherally enhancing fluid density for which developing tiny labial abscess is a concern. 2. Bladder wall thickening and surrounding edema, suspicious for cystitis. 3.  Possible constipation. 4. Coronary artery atherosclerosis. Aortic Atherosclerosis (ICD10-I70.0). Emphysema (ICD10-J43.9). 5. Cholelithiasis.  6. Osteopenia with T11 compression deformity, new since 05/21/2015. Electronically Signed   By: Abigail Miyamoto M.D.   On: 10/18/2019 12:38   US Venous Img Lower Bilateral (DVT)  Result Date: 10/19/2019 CLINICAL  DATA:  Bilateral lower extremity pain. Remote history of DVT (1987). History of PAD, post bilateral lower extremity arterial stent placement. Evaluate for acute or chronic DVT. EXAM: BILATERAL LOWER EXTREMITY VENOUS DOPPLER ULTRASOUND TECHNIQUE: Gray-scale sonography with graded compression, as well as color Doppler and duplex ultrasound were performed to evaluate the lower extremity deep venous systems from the level of the common femoral vein and including the common femoral, femoral, profunda femoral, popliteal and calf veins including the posterior tibial, peroneal and gastrocnemius veins when visible. The superficial great saphenous vein was also interrogated. Spectral Doppler was utilized to evaluate flow at rest and with distal augmentation maneuvers in the common femoral, femoral and popliteal veins. COMPARISON:  Right lower extremity venous Doppler ultrasound-04/09/2015 (negative) FINDINGS: RIGHT LOWER EXTREMITY Common Femoral Vein: No evidence of thrombus. Normal compressibility, respiratory phasicity and response to augmentation. Saphenofemoral Junction: No evidence of thrombus. Normal compressibility and flow on color Doppler imaging. Profunda Femoral Vein: No evidence of thrombus. Normal compressibility and flow on color Doppler imaging. Femoral Vein: No evidence of thrombus. Normal compressibility, respiratory phasicity and response to augmentation. Popliteal Vein: No evidence of thrombus. Normal compressibility, respiratory phasicity and response to augmentation. Calf Veins: No evidence of thrombus. Normal compressibility and flow on color Doppler imaging. Superficial Great Saphenous Vein: No evidence of thrombus. Normal compressibility. Venous Reflux:  None. Other Findings:  None. LEFT LOWER EXTREMITY Common Femoral Vein: No evidence of thrombus. Normal compressibility, respiratory phasicity and response to augmentation. Saphenofemoral Junction: No evidence of thrombus. Normal compressibility and  flow on color Doppler imaging. Profunda Femoral Vein: No evidence of thrombus. Normal compressibility and flow on color Doppler imaging. Femoral Vein: No evidence of thrombus. Normal compressibility, respiratory phasicity and response to augmentation. Popliteal Vein: No evidence of thrombus. Normal compressibility, respiratory phasicity and response to augmentation. Calf Veins: No evidence of thrombus. Normal compressibility and flow on color Doppler imaging. Superficial Great Saphenous Vein: No evidence of thrombus. Normal compressibility. Venous Reflux:  None. Other Findings:  None. IMPRESSION: No evidence of acute or chronic DVT within either lower extremity. Electronically Signed   By: Sandi Mariscal M.D.   On: 10/19/2019 16:30     Medications:   . sodium chloride 30 mL/hr at 10/19/19 1515  . piperacillin-tazobactam (ZOSYN)  IV 2.25 g (10/20/19 0557)  . vancomycin Stopped (10/19/19 1352)   . aspirin EC  325 mg Oral Daily  . Chlorhexidine Gluconate Cloth  6 each Topical Q0600  . cyclobenzaprine  5 mg Oral QHS  . feeding supplement (NEPRO CARB STEADY)  237 mL Oral Daily  . feeding supplement (PRO-STAT SUGAR FREE 64)  30 mL Oral BID  . levothyroxine  100 mcg Oral QAC breakfast  . lidocaine  1 patch Transdermal Q12H  . metoprolol tartrate  25 mg Oral q1800  . multivitamin  1 tablet Oral Daily  . mupirocin ointment  1 application Nasal BID  . pantoprazole  40 mg Oral Daily  . sevelamer carbonate  800 mg Oral TID WC  . sodium chloride flush  3 mL Intravenous Q12H  . valACYclovir  500 mg Oral QHS   sodium chloride, acetaminophen **OR** acetaminophen, docusate sodium, fluticasone, lidocaine-prilocaine, ondansetron **OR** ondansetron (ZOFRAN) IV, oxyCODONE, sodium chloride, sodium chloride flush  Assessment/ Plan:  Ms. SAKEENAH VALCARCEL is  a 69 y.o. white female with end stage renal disease on hemodialysis, hypertension, hypothyroidism, COPD, congestive heart failure, AAA who is admitted to [facility]  on 10/18/2019 for Labial abscess [N76.4] Cellulitis of labia [N76.2] Cellulitis, unspecified cellulitis site [L03.90] Placed on vanco and pip/tazo. OB/GYN following.   [facility] [place] Left AVG 47.5kg.   1. End Stage Renal Disease:  Continue TTS schedule  2. Hypertension:  - metoprolol  3. Anemia of chronic kidney disease: hemoglobin 9.3. Macrocytic.  - EPO with HD treatments.   4. Secondary Hyperparathyroidism:  - Sevelamer with meals.    LOS: 2 Ronee Ranganathan 7/23/202111:39 AM

## 2019-10-21 DIAGNOSIS — N186 End stage renal disease: Secondary | ICD-10-CM

## 2019-10-21 DIAGNOSIS — R531 Weakness: Secondary | ICD-10-CM

## 2019-10-21 DIAGNOSIS — E038 Other specified hypothyroidism: Secondary | ICD-10-CM

## 2019-10-21 DIAGNOSIS — M79605 Pain in left leg: Secondary | ICD-10-CM

## 2019-10-21 DIAGNOSIS — M79604 Pain in right leg: Secondary | ICD-10-CM

## 2019-10-21 DIAGNOSIS — N762 Acute vulvitis: Principal | ICD-10-CM

## 2019-10-21 DIAGNOSIS — J9611 Chronic respiratory failure with hypoxia: Secondary | ICD-10-CM

## 2019-10-21 DIAGNOSIS — N764 Abscess of vulva: Secondary | ICD-10-CM

## 2019-10-21 LAB — CBC
HCT: 27.1 % — ABNORMAL LOW (ref 36.0–46.0)
Hemoglobin: 9.3 g/dL — ABNORMAL LOW (ref 12.0–15.0)
MCH: 36.2 pg — ABNORMAL HIGH (ref 26.0–34.0)
MCHC: 34.3 g/dL (ref 30.0–36.0)
MCV: 105.4 fL — ABNORMAL HIGH (ref 80.0–100.0)
Platelets: 204 10*3/uL (ref 150–400)
RBC: 2.57 MIL/uL — ABNORMAL LOW (ref 3.87–5.11)
RDW: 14 % (ref 11.5–15.5)
WBC: 9.6 10*3/uL (ref 4.0–10.5)
nRBC: 0 % (ref 0.0–0.2)

## 2019-10-21 LAB — RENAL FUNCTION PANEL
Albumin: 2.3 g/dL — ABNORMAL LOW (ref 3.5–5.0)
Anion gap: 17 — ABNORMAL HIGH (ref 5–15)
BUN: 59 mg/dL — ABNORMAL HIGH (ref 8–23)
CO2: 25 mmol/L (ref 22–32)
Calcium: 8.8 mg/dL — ABNORMAL LOW (ref 8.9–10.3)
Chloride: 98 mmol/L (ref 98–111)
Creatinine, Ser: 6.92 mg/dL — ABNORMAL HIGH (ref 0.44–1.00)
GFR calc Af Amer: 6 mL/min — ABNORMAL LOW (ref >60)
GFR calc non Af Amer: 6 mL/min — ABNORMAL LOW (ref >60)
Glucose, Bld: 119 mg/dL — ABNORMAL HIGH (ref 70–99)
Phosphorus: 3.8 mg/dL (ref 2.5–4.6)
Potassium: 3.8 mmol/L (ref 3.5–5.1)
Sodium: 140 mmol/L (ref 135–145)

## 2019-10-21 LAB — GLUCOSE, CAPILLARY: Glucose-Capillary: 78 mg/dL (ref 70–99)

## 2019-10-21 MED ORDER — AMOXICILLIN-POT CLAVULANATE 500-125 MG PO TABS
1.0000 | ORAL_TABLET | Freq: Every day | ORAL | 0 refills | Status: DC
Start: 2019-10-21 — End: 2020-04-19

## 2019-10-21 MED ORDER — MUPIROCIN 2 % EX OINT: 1.0000 "application " | TOPICAL_OINTMENT | Freq: Two times a day (BID) | CUTANEOUS | 0 refills | Status: AC

## 2019-10-21 MED ORDER — DOXYCYCLINE HYCLATE 100 MG PO TABS: 100.0000 mg | ORAL_TABLET | Freq: Two times a day (BID) | ORAL | Status: DC

## 2019-10-21 MED ORDER — OXYCODONE HCL 5 MG PO TABS: 5.0000 mg | ORAL_TABLET | Freq: Four times a day (QID) | ORAL | 0 refills | Status: DC | PRN

## 2019-10-21 MED ORDER — DOXYCYCLINE HYCLATE 100 MG PO TABS: 100.0000 mg | ORAL_TABLET | Freq: Two times a day (BID) | ORAL | 0 refills | Status: AC

## 2019-10-21 NOTE — Progress Notes (Signed)
Patient ID: [name], female   DOB: December 04, 1950, 69 y.o.   MRN: [id number]     Subjective:    She feels well.  She has moderate discomfort but it is intermittent and she functions well.  Objective:    Examination of the labia reveals a small area of drainage yellowish purulent material.  Her labial cellulitis seems less tense than yesterday.  It is mildly tender to palpation.  No erythema.  Labs: No results found for this or any previous visit (from the past 24 hour(s)).  Medications    Current Discharge Medication List    START taking these medications   Details  amoxicillin-clavulanate (AUGMENTIN) 500-125 MG tablet Take 1 tablet (500 mg total) by mouth at bedtime. Qty: 10 tablet, Refills: 0    doxycycline (VIBRA-TABS) 100 MG tablet Take 1 tablet (100 mg total) by mouth every 12 (twelve) hours. Qty: 20 tablet, Refills: 0    mupirocin ointment (BACTROBAN) 2 % Place 1 application into the nose 2 (two) times daily for 3 days. Qty: 22 g, Refills: 0    oxyCODONE (OXY IR/ROXICODONE) 5 MG immediate release tablet Take 1 tablet (5 mg total) by mouth every 6 (six) hours as needed for severe pain. Qty: 10 tablet, Refills: 0      CONTINUE these medications which have NOT CHANGED   Details  aspirin 325 MG tablet Take 325 mg by mouth daily.     B Complex-C-Folic Acid (DIALYVITE TABLET) TABS Take 1 tablet by mouth daily.    Biotin 5000 MCG CAPS Take 5,000 mcg by mouth daily at 6 PM.     cyclobenzaprine (FLEXERIL) 5 MG tablet Take 5 mg by mouth at bedtime.     Docusate Sodium 100 MG capsule Take 100 mg by mouth daily as needed for constipation.     fluticasone (FLONASE) 50 MCG/ACT nasal spray Place 2 sprays into both nostrils daily as needed for allergies.     gabapentin (NEURONTIN) 100 MG capsule Take 100 mg by mouth 3 (three) times daily.    levothyroxine (SYNTHROID, LEVOTHROID) 100 MCG tablet Take 1 tablet (100 mcg total) by mouth daily. Qty: 90 tablet, Refills: 3      lidocaine-prilocaine (EMLA) cream Apply 1 application topically as needed (for port access).     metoprolol tartrate (LOPRESSOR) 25 MG tablet Take 1 tablet (25 mg total) by mouth daily. Qty: 90 tablet, Refills: 0   Comments: NEEDS OFFICE VISIT FOR FURTHER REFILLS    omeprazole (PRILOSEC) 40 MG capsule Take 40 mg by mouth daily.     sevelamer carbonate (RENVELA) 800 MG tablet Take 800 mg by mouth 3 (three) times daily with meals.     SSD 1 % cream Apply 1 application topically daily.    valACYclovir (VALTREX) 500 MG tablet Take 500 mg by mouth at bedtime.     albuterol (PROVENTIL HFA;VENTOLIN HFA) 108 (90 Base) MCG/ACT inhaler Inhale 2 puffs into the lungs every 6 (six) hours as needed for wheezing or shortness of breath.    Amino Acids-Protein Hydrolys (FEEDING SUPPLEMENT, PRO-STAT SUGAR FREE 64,) LIQD Take 30 mLs by mouth 2 (two) times daily.    lidocaine (LIDODERM) 5 % Place 1 patch onto the skin every 12 (twelve) hours. Remove & Discard patch within 12 hours or as directed by MD Qty: 10 patch, Refills: 0    Nutritional Supplements (FEEDING SUPPLEMENT, NEPRO CARB STEADY,) LIQD Take 237 mLs by mouth daily.    sodium chloride (OCEAN) 0.65 % SOLN nasal spray  Place 1 spray into both nostrils daily as needed for congestion.       STOP taking these medications     ciprofloxacin (CIPRO) 250 MG tablet Comments:  Reason for Stopping:            Assessment:    Right labial cellulitis-slowly resolving.  Patient afebrile  Taking p.o. without difficulty  Plan:    Discussed plan of care with [name]  Will plan for discharge on oral antibiotics (Augmentin and doxycycline) with MRSA coverage  Follow-up in 6 days with [name] for reevaluation.  Discussed possible sits baths, warm compresses, general cleansing of the area with the patient.  Warnings regarding fever increased swelling or tenderness discussed.  [name], M.D. 10/21/2019 10:35 AM

## 2019-10-21 NOTE — Progress Notes (Signed)
[facility]  ROUNDING NOTE   Subjective:  Patient seen and evaluated at bedside. Underwent dialysis treatment today. Tolerated well.     Objective:  Vital signs in last 24 hours:  Temp:  [98 F (36.7 C)-99.1 F (37.3 C)] 98 F (36.7 C) (07/24 1015) Pulse Rate:  [86-91] 86 (07/24 0427) Resp:  [18] 18 (07/24 0427) BP: (127-147)/(50-51) 127/50 (07/24 0427) SpO2:  [91 %-96 %] 96 % (07/24 0427)  Weight change:  Filed Weights   10/18/19 0922  Weight: 47.2 kg    Intake/Output: I/O last 3 completed shifts: In: 10 [I.V.:10] Out: 0    Intake/Output this shift:  No intake/output data recorded.  Physical Exam: General: NAD, laying in bed  Head: Normocephalic, atraumatic. Moist oral mucosal membranes  Eyes: Anicteric  Neck: Supple, trachea midline  Lungs:  Clear to auscultation, normal effort  Heart: Regular rate and rhythm  Abdomen:  Soft, nontender, bowel sounds positive  Extremities: no peripheral edema.  Neurologic: Nonfocal, moving all four extremities  Skin: No lesions  Access: Left AVG    Basic Metabolic Panel: Recent Labs  Lab 10/18/19 1051 10/19/19 0458 10/21/19 1035  NA 141 138 140  K 3.9 4.7 3.8  CL 96* 96* 98  CO2 28 29 25   GLUCOSE 85 82 119*  BUN 30* 43* 59*  CREATININE 4.87* 6.08* 6.92*  CALCIUM 9.1 8.9 8.8*  PHOS  --   --  3.8    Liver Function Tests: Recent Labs  Lab 10/18/19 1051 10/21/19 1035  AST 22  --   ALT 15  --   ALKPHOS 143*  --   BILITOT 1.3*  --   PROT 6.5  --   ALBUMIN 3.1* 2.3*   No results for input(s): LIPASE, AMYLASE in the last 168 hours. No results for input(s): AMMONIA in the last 168 hours.  CBC: Recent Labs  Lab 10/18/19 1051 10/18/19 2100 10/19/19 0458 10/20/19 0559 10/21/19 1035  WBC 14.7* 16.0* 14.3* 9.8 9.6  NEUTROABS 12.1*  --   --   --   --   HGB 10.2* 11.8* 9.9* 9.3* 9.3*  HCT 29.7* 34.6* 29.5* 26.8* 27.1*  MCV 105.7* 107.1* 107.3* 104.7* 105.4*  PLT 214 216 221 187 204     Cardiac Enzymes: No results for input(s): CKTOTAL, CKMB, CKMBINDEX, TROPONINI in the last 168 hours.  BNP: Invalid input(s): POCBNP  CBG: Recent Labs  Lab 10/19/19 1333  GLUCAP 110*    Microbiology: Results for orders placed or performed during the hospital encounter of 10/18/19  SARS Coronavirus 2 by RT PCR (hospital order, performed in [facility] hospital lab) Nasopharyngeal Nasopharyngeal Swab     Status: None   Collection Time: 10/18/19 12:31 PM   Specimen: Nasopharyngeal Swab  Result Value Ref Range Status   SARS Coronavirus 2 NEGATIVE NEGATIVE Final    Comment: (NOTE) SARS-CoV-2 target nucleic acids are NOT DETECTED.  The SARS-CoV-2 RNA is generally detectable in upper and lower respiratory specimens during the acute phase of infection. The lowest concentration of SARS-CoV-2 viral copies this assay can detect is 250 copies / mL. A negative result does not preclude SARS-CoV-2 infection and should not be used as the sole basis for treatment or other patient management decisions.  A negative result may occur with improper specimen collection / handling, submission of specimen other than nasopharyngeal swab, presence of viral mutation(s) within the areas targeted by this assay, and inadequate number of viral copies (<250 copies / mL). A negative result must  be combined with clinical observations, patient history, and epidemiological information.  Fact Sheet for Patients:   StrictlyIdeas.no  Fact Sheet for Healthcare Providers: BankingDealers.co.za  This test is not yet approved or  cleared by the [place] FDA and has been authorized for detection and/or diagnosis of SARS-CoV-2 by FDA under an Emergency Use Authorization (EUA).  This EUA will remain in effect (meaning this test can be used) for the duration of the COVID-19 declaration under Section 564(b)(1) of the Act, 21 U.S.C. section 360bbb-3(b)(1), unless  the authorization is terminated or revoked sooner.  Performed at [facility], [address] [place], [place] [address]   MRSA PCR Screening     Status: Abnormal   Collection Time: 10/19/19 12:30 AM   Specimen: Nasal Mucosa; Nasopharyngeal  Result Value Ref Range Status   MRSA by PCR POSITIVE (A) NEGATIVE Final    Comment:        The GeneXpert MRSA Assay (FDA approved for NASAL specimens only), is one component of a comprehensive MRSA colonization surveillance program. It is not intended to diagnose MRSA infection nor to guide or monitor treatment for MRSA infections. RESULT CALLED TO, READ BACK BY AND VERIFIED WITH: MARIA JODLOMAN AT 0211 on 10/19/2019 [facility]. Performed at [facility], [address] [place], [place] [address]     Coagulation Studies: No results for input(s): LABPROT, INR in the last 72 hours.  Urinalysis: No results for input(s): COLORURINE, LABSPEC, PHURINE, GLUCOSEU, HGBUR, BILIRUBINUR, KETONESUR, PROTEINUR, UROBILINOGEN, NITRITE, LEUKOCYTESUR in the last 72 hours.  Invalid input(s): APPERANCEUR    Imaging: US Venous Img Lower Bilateral (DVT)  Result Date: 10/19/2019 CLINICAL DATA:  Bilateral lower extremity pain. Remote history of DVT (1987). History of PAD, post bilateral lower extremity arterial stent placement. Evaluate for acute or chronic DVT. EXAM: BILATERAL LOWER EXTREMITY VENOUS DOPPLER ULTRASOUND TECHNIQUE: Gray-scale sonography with graded compression, as well as color Doppler and duplex ultrasound were performed to evaluate the lower extremity deep venous systems from the level of the common femoral vein and including the common femoral, femoral, profunda femoral, popliteal and calf veins including the posterior tibial, peroneal and gastrocnemius veins when visible. The superficial great saphenous vein was also interrogated. Spectral Doppler was utilized to evaluate flow at rest and with distal augmentation maneuvers in the  common femoral, femoral and popliteal veins. COMPARISON:  Right lower extremity venous Doppler ultrasound-04/09/2015 (negative) FINDINGS: RIGHT LOWER EXTREMITY Common Femoral Vein: No evidence of thrombus. Normal compressibility, respiratory phasicity and response to augmentation. Saphenofemoral Junction: No evidence of thrombus. Normal compressibility and flow on color Doppler imaging. Profunda Femoral Vein: No evidence of thrombus. Normal compressibility and flow on color Doppler imaging. Femoral Vein: No evidence of thrombus. Normal compressibility, respiratory phasicity and response to augmentation. Popliteal Vein: No evidence of thrombus. Normal compressibility, respiratory phasicity and response to augmentation. Calf Veins: No evidence of thrombus. Normal compressibility and flow on color Doppler imaging. Superficial Great Saphenous Vein: No evidence of thrombus. Normal compressibility. Venous Reflux:  None. Other Findings:  None. LEFT LOWER EXTREMITY Common Femoral Vein: No evidence of thrombus. Normal compressibility, respiratory phasicity and response to augmentation. Saphenofemoral Junction: No evidence of thrombus. Normal compressibility and flow on color Doppler imaging. Profunda Femoral Vein: No evidence of thrombus. Normal compressibility and flow on color Doppler imaging. Femoral Vein: No evidence of thrombus. Normal compressibility, respiratory phasicity and response to augmentation. Popliteal Vein: No evidence of thrombus. Normal compressibility, respiratory phasicity and response to augmentation. Calf Veins: No evidence of thrombus.  Normal compressibility and flow on color Doppler imaging. Superficial Great Saphenous Vein: No evidence of thrombus. Normal compressibility. Venous Reflux:  None. Other Findings:  None. IMPRESSION: No evidence of acute or chronic DVT within either lower extremity. Electronically Signed   By: Sandi Mariscal M.D.   On: 10/19/2019 16:30     Medications:   . sodium  chloride 30 mL/hr at 10/19/19 1515  . piperacillin-tazobactam (ZOSYN)  IV 2.25 g (10/21/19 0516)  . vancomycin Stopped (10/19/19 1352)   . aspirin EC  325 mg Oral Daily  . Chlorhexidine Gluconate Cloth  6 each Topical Q0600  . cyclobenzaprine  5 mg Oral QHS  . [START ON 10/22/2019] doxycycline  100 mg Oral Q12H  . epoetin (EPOGEN/PROCRIT) injection  10,000 Units Intravenous Q T,Th,Sa-HD  . feeding supplement (NEPRO CARB STEADY)  237 mL Oral Daily  . feeding supplement (PRO-STAT SUGAR FREE 64)  30 mL Oral BID  . levothyroxine  100 mcg Oral QAC breakfast  . lidocaine  1 patch Transdermal Q12H  . metoprolol tartrate  25 mg Oral q1800  . multivitamin  1 tablet Oral Daily  . mupirocin ointment  1 application Nasal BID  . pantoprazole  40 mg Oral Daily  . sevelamer carbonate  800 mg Oral TID WC  . sodium chloride flush  3 mL Intravenous Q12H  . valACYclovir  500 mg Oral QHS   sodium chloride, acetaminophen **OR** acetaminophen, docusate sodium, fluticasone, lidocaine-prilocaine, ondansetron **OR** ondansetron (ZOFRAN) IV, oxyCODONE, sodium chloride, sodium chloride flush  Assessment/ Plan:  Ms. [name] is a 69 y.o. white female with end stage renal disease on hemodialysis, hypertension, hypothyroidism, COPD, congestive heart failure, AAA who is admitted to [facility] on 10/18/2019 for Labial abscess [N76.4] Cellulitis of labia [N76.2] Cellulitis, unspecified cellulitis site [L03.90] Placed on vanco and pip/tazo. OB/GYN following.   [facility] [place] Left AVG 47.5kg.   1. End Stage Renal Disease:  Patient underwent dialysis treatment today.  Tolerated well.  Next dialysis treatment on Tuesday as an outpatient.  2. Hypertension:  -Maintain the patient on metoprolol for blood pressure control.  3. Anemia of chronic kidney disease:  Lab Results  Component Value Date   HGB 9.3 (L) 10/21/2019  Maintain the patient on Epogen as an outpatient.   4. Secondary  Hyperparathyroidism:  -Monitor bone mineral metabolism parameters as an outpatient.   LOS: 3 Maybelle Depaoli 7/24/20212:24 PM

## 2019-10-21 NOTE — Plan of Care (Signed)
Discharge teaching completed with patient who is in stable condition. 

## 2019-10-21 NOTE — Plan of Care (Signed)
Continuing with plan of care. 

## 2019-10-21 NOTE — Progress Notes (Signed)
HD assessment. During treatment documenting is on paper.

## 2019-10-21 NOTE — Discharge Summary (Signed)
[facility] [place] NAME: [name]    MR#:  [id number]  DATE OF BIRTH:  Jan 24, 1951  DATE OF ADMISSION:  10/18/2019 ADMITTING PHYSICIAN: Collier Bullock, MD  DATE OF DISCHARGE: 10/21/2019  PRIMARY CARE PHYSICIAN: Baxter Hire, MD    ADMISSION DIAGNOSIS:  Labial abscess [N76.4] Cellulitis of labia [N76.2] Cellulitis, unspecified cellulitis site [L03.90]  DISCHARGE DIAGNOSIS:  Principal Problem:   Cellulitis of labia Active Problems:   Hypothyroidism   Essential hypertension   Chronic obstructive pulmonary disease (HCC)   COPD exacerbation (HCC)   Anemia of chronic disease   Weakness   ESRD (end stage renal disease) (HCC)   Chronic respiratory failure with hypoxia (HCC)   History of MRSA infection   Labial abscess   Pain in both lower extremities   Chronic diastolic CHF (congestive heart failure) ([place])   SECONDARY DIAGNOSIS:   Past Medical History:  Diagnosis Date  . Anemia   . Aneurysm ([place])    abdominal aortic  . Anxiety   . B12 deficiency   . Cancer ([place])    skin on left ankle 04/10/15 pt states she has never had cancer  . CHF (congestive heart failure) ([place])   . COPD (chronic obstructive pulmonary disease) ([place])   . Dialysis patient ([place])    Tues, Thurs, Sat  . Diastolic heart failure ([place])   . Diverticulosis   . ESRD (end stage renal disease) ([place])   . GERD (gastroesophageal reflux disease)   . Granuloma annulare 2010   skin- sees dermatologist  . Heart murmur   . Hyperlipidemia   . Hypertension   . Hypothyroidism   . On home oxygen therapy   . Renal insufficiency   . STD (sexually transmitted disease)    chlamydia  . Thyroid disease   . VAIN II (vaginal intraepithelial neoplasia grade II) 7/09    HOSPITAL COURSE:   1.  Cellulitis of the right labia.  Patient still has swelling and fullness of the right labia.  Case discussed with gynecology and they do not believe that this is an abscess.   Patient was on vancomycin and Zosyn in the hospital.  The patient does have some drainage with warm soaks can continue warm soaks.  Gynecology felt the patient was stable for disposition home.  I will switch over to doxycycline and Augmentin at night.  Only a small course of pain medications prescribed. 2.  Leg pain bilaterally.  Sonogram negative for DVT. 3.  End-stage renal disease on dialysis.  Patient had dialysis today prior to disposition 4.  Chronic respiratory failure with hypoxia on oxygen 5.  Hypothyroidism unspecified on levothyroxine 6.  Chronic diastolic congestive heart failure.  No signs of heart failure on this hospital stay.  Dialysis to remove fluid. 7.  Weakness.  Patient is mostly wheelchair-bound but able to transfer herself to the commode. 8.  Anemia of chronic disease  DISCHARGE CONDITIONS:   Satisfactory  CONSULTS OBTAINED:  Gynecology  DRUG ALLERGIES:   Allergies  Allergen Reactions  . Asa [Aspirin] Other (See Comments)    Patient not sure why this is listed, she takes 325 mg Aspirin once daily  . 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 Plavix [Clopidogrel] Other (See Comments)    unknown    DISCHARGE MEDICATIONS:   Allergies as of 10/21/2019      Reactions   Asa [aspirin] Other (See Comments)   Patient not sure why  this is listed, she takes 325 mg Aspirin once daily   Heparin Other (See Comments)   HIT antibody positive   Plavix [clopidogrel] Other (See Comments)   unknown      Medication List    STOP taking these medications   ciprofloxacin 250 MG tablet Commonly known as: CIPRO     TAKE these medications   albuterol 108 (90 Base) MCG/ACT inhaler Commonly known as: VENTOLIN HFA Inhale 2 puffs into the lungs every 6 (six) hours as needed for wheezing or shortness of breath.   amoxicillin-clavulanate 500-125 MG tablet Commonly known as: Augmentin Take 1 tablet (500 mg total) by mouth at bedtime.   aspirin 325 MG  tablet Take 325 mg by mouth daily.   Biotin 5000 MCG Caps Take 5,000 mcg by mouth daily at 6 PM.   cyclobenzaprine 5 MG tablet Commonly known as: FLEXERIL Take 5 mg by mouth at bedtime.   DIALYVITE TABLET Tabs Take 1 tablet by mouth daily.   Docusate Sodium 100 MG capsule Take 100 mg by mouth daily as needed for constipation.   doxycycline 100 MG tablet Commonly known as: VIBRA-TABS Take 1 tablet (100 mg total) by mouth every 12 (twelve) hours. Start taking on: October 22, 2019   feeding supplement (NEPRO CARB STEADY) Liqd Take 237 mLs by mouth daily.   feeding supplement (PRO-STAT SUGAR FREE 64) Liqd Take 30 mLs by mouth 2 (two) times daily.   fluticasone 50 MCG/ACT nasal spray Commonly known as: FLONASE Place 2 sprays into both nostrils daily as needed for allergies.   gabapentin 100 MG capsule Commonly known as: NEURONTIN Take 100 mg by mouth 3 (three) times daily.   levothyroxine 100 MCG tablet Commonly known as: SYNTHROID Take 1 tablet (100 mcg total) by mouth daily. What changed: when to take this   lidocaine 5 % Commonly known as: Lidoderm Place 1 patch onto the skin every 12 (twelve) hours. Remove & Discard patch within 12 hours or as directed by MD   lidocaine-prilocaine cream Commonly known as: EMLA Apply 1 application topically as needed (for port access).   metoprolol tartrate 25 MG tablet Commonly known as: LOPRESSOR Take 1 tablet (25 mg total) by mouth daily. What changed: when to take this   mupirocin ointment 2 % Commonly known as: BACTROBAN Place 1 application into the nose 2 (two) times daily for 3 days.   omeprazole 40 MG capsule Commonly known as: PRILOSEC Take 40 mg by mouth daily.   oxyCODONE 5 MG immediate release tablet Commonly known as: Oxy IR/ROXICODONE Take 1 tablet (5 mg total) by mouth every 6 (six) hours as needed for severe pain.   sevelamer carbonate 800 MG tablet Commonly known as: RENVELA Take 800 mg by mouth 3 (three)  times daily with meals.   sodium chloride 0.65 % Soln nasal spray Commonly known as: OCEAN Place 1 spray into both nostrils daily as needed for congestion.   SSD 1 % cream Generic drug: silver sulfADIAZINE Apply 1 application topically daily.   valACYclovir 500 MG tablet Commonly known as: VALTREX Take 500 mg by mouth at bedtime.        DISCHARGE INSTRUCTIONS:   Follow-up PMD 5 days Follow-up gynecology 1 week  If you experience worsening of your admission symptoms, develop shortness of breath, life threatening emergency, suicidal or homicidal thoughts you must seek medical attention immediately by calling 911 or calling your MD immediately  if symptoms less severe.  You Must read complete instructions/literature along with  all the possible adverse reactions/side effects for all the Medicines you take and that have been prescribed to you. Take any new Medicines after you have completely understood and accept all the possible adverse reactions/side effects.   Please note  You were cared for by a hospitalist during your hospital stay. If you have any questions about your discharge medications or the care you received while you were in the hospital after you are discharged, you can call the unit and asked to speak with the hospitalist on call if the hospitalist that took care of you is not available. Once you are discharged, your primary care physician will handle any further medical issues. Please note that NO REFILLS for any discharge medications will be authorized once you are discharged, as it is imperative that you return to your primary care physician (or establish a relationship with a primary care physician if you do not have one) for your aftercare needs so that they can reassess your need for medications and monitor your lab values.    Today   CHIEF COMPLAINT:   Chief Complaint  Patient presents with  . Groin Pain    HISTORY OF PRESENT ILLNESS:  [name]  is a  69 y.o. female coming in with groin pain and found to have cellulitis of the right labia   VITAL SIGNS:  Blood pressure (!) 143/48, pulse 101, temperature 98.2 F (36.8 C), temperature source Oral, resp. rate 16, height 5\' 4"  (1.626 m), weight 47.2 kg, last menstrual period 03/30/1988, SpO2 99 %.  I/O:    Intake/Output Summary (Last 24 hours) at 10/21/2019 1638 Last data filed at 10/21/2019 1501 Gross per 24 hour  Intake --  Output 497 ml  Net -497 ml    PHYSICAL EXAMINATION:  GENERAL:  69 y.o.-year-old patient lying in the bed with no acute distress.  EYES: Pupils equal, round, reactive to light and accommodation. No scleral icterus. Extraocular muscles intact.  HEENT: Head atraumatic, normocephalic. Oropharynx and nasopharynx clear.   LUNGS: Normal breath sounds bilaterally, no wheezing, rales,rhonchi or crepitation. No use of accessory muscles of respiration.  CARDIOVASCULAR: S1, S2 normal. No murmurs, rubs, or gallops.  ABDOMEN: Soft, non-tender, non-distended. EXTREMITIES: No pedal edema, cyanosis, or clubbing.  NEUROLOGIC: Cranial nerves II through XII are intact. Muscle strength 5/5 in all extremities. Sensation intact. Gait not checked.  PSYCHIATRIC: The patient is alert and oriented x 3.  SKIN: Right labial fullness.  Area of where some bloody drainage seen.  Patient also has some fullness in the right mons pubis.  The patient's nurse Claiborne Billings was in the room with me during exam.  DATA REVIEW:   CBC Recent Labs  Lab 10/21/19 1035  WBC 9.6  HGB 9.3*  HCT 27.1*  PLT 204    Chemistries  Recent Labs  Lab 10/18/19 1051 10/19/19 0458 10/21/19 1035  NA 141   < > 140  K 3.9   < > 3.8  CL 96*   < > 98  CO2 28   < > 25  GLUCOSE 85   < > 119*  BUN 30*   < > 59*  CREATININE 4.87*   < > 6.92*  CALCIUM 9.1   < > 8.8*  AST 22  --   --   ALT 15  --   --   ALKPHOS 143*  --   --   BILITOT 1.3*  --   --    < > = values in this interval not displayed.  Microbiology  Results  Results for orders placed or performed during the hospital encounter of 10/18/19  SARS Coronavirus 2 by RT PCR (hospital order, performed in [facility] hospital lab) Nasopharyngeal Nasopharyngeal Swab     Status: None   Collection Time: 10/18/19 12:31 PM   Specimen: Nasopharyngeal Swab  Result Value Ref Range Status   SARS Coronavirus 2 NEGATIVE NEGATIVE Final    Comment: (NOTE) SARS-CoV-2 target nucleic acids are NOT DETECTED.  The SARS-CoV-2 RNA is generally detectable in upper and lower respiratory specimens during the acute phase of infection. The lowest concentration of SARS-CoV-2 viral copies this assay can detect is 250 copies / mL. A negative result does not preclude SARS-CoV-2 infection and should not be used as the sole basis for treatment or other patient management decisions.  A negative result may occur with improper specimen collection / handling, submission of specimen other than nasopharyngeal swab, presence of viral mutation(s) within the areas targeted by this assay, and inadequate number of viral copies (<250 copies / mL). A negative result must be combined with clinical observations, patient history, and epidemiological information.  Fact Sheet for Patients:   StrictlyIdeas.no  Fact Sheet for Healthcare Providers: BankingDealers.co.za  This test is not yet approved or  cleared by the [place] FDA and has been authorized for detection and/or diagnosis of SARS-CoV-2 by FDA under an Emergency Use Authorization (EUA).  This EUA will remain in effect (meaning this test can be used) for the duration of the COVID-19 declaration under Section 564(b)(1) of the Act, 21 U.S.C. section 360bbb-3(b)(1), unless the authorization is terminated or revoked sooner.  Performed at [facility], [address] [place], [place] [address]   MRSA PCR Screening     Status: Abnormal   Collection Time: 10/19/19  12:30 AM   Specimen: Nasal Mucosa; Nasopharyngeal  Result Value Ref Range Status   MRSA by PCR POSITIVE (A) NEGATIVE Final    Comment:        The GeneXpert MRSA Assay (FDA approved for NASAL specimens only), is one component of a comprehensive MRSA colonization surveillance program. It is not intended to diagnose MRSA infection nor to guide or monitor treatment for MRSA infections. RESULT CALLED TO, READ BACK BY AND VERIFIED WITH: MARIA JODLOMAN AT 0211 on 10/19/2019 [facility]. Performed at [facility], [address], [place], [place] [address]       Management plans discussed with the patient, family and they are in agreement.  CODE STATUS:     Code Status Orders  (From admission, onward)         Start     Ordered   10/18/19 1648  Full code  Continuous        10/18/19 1649        Code Status History    Date Active Date Inactive Code Status Order ID Comments User Context   07/16/2015 0235 07/23/2015 1839 Full Code [id number]  Saundra Shelling, MD Inpatient   06/15/2015 1837 06/24/2015 1933 Full Code [id number]  Bettey Costa, MD Inpatient   05/20/2015 1113 06/01/2015 2121 Full Code [id number]  Fritzi Mandes, MD Inpatient   04/09/2015 1024 04/22/2015 2115 Full Code [id number]  Theodoro Grist, MD Inpatient   Advance Care Planning Activity      TOTAL TIME TAKING CARE OF THIS PATIENT: 35 minutes.    Loletha Grayer M.D on 10/21/2019 at 4:38 PM  Between 7am to 6pm - Pager - [phone]  After 6pm go to [email or web address] - [facility]  CC: Primary care physician; Baxter Hire, MD

## 2019-10-21 NOTE — Discharge Instructions (Signed)

## 2019-10-23 ENCOUNTER — Telehealth: Payer: Self-pay | Admitting: Obstetrics and Gynecology

## 2019-10-23 NOTE — Telephone Encounter (Signed)
Pt called in and stated that she left the hospital and Dr. Amalia Hailey told the pt to see Dr. Marcelline Mates on Friday the 30th. I put the pt on at 11:30, I told the pt that I put you in a spot that I am have to call you back if that time isn't okay. I also told the pt that Dr. Marcelline Mates was not in the office today.

## 2019-10-23 NOTE — Telephone Encounter (Signed)
This spot is fine.

## 2019-10-27 ENCOUNTER — Ambulatory Visit (INDEPENDENT_AMBULATORY_CARE_PROVIDER_SITE_OTHER): Payer: Medicare Other | Admitting: Obstetrics and Gynecology

## 2019-10-27 ENCOUNTER — Other Ambulatory Visit: Payer: Self-pay

## 2019-10-27 ENCOUNTER — Encounter: Payer: Self-pay | Admitting: Obstetrics and Gynecology

## 2019-10-27 VITALS — BP 150/62 | HR 93 | Ht 64.0 in | Wt 104.0 lb

## 2019-10-27 DIAGNOSIS — N764 Abscess of vulva: Secondary | ICD-10-CM | POA: Diagnosis not present

## 2019-10-27 DIAGNOSIS — N762 Acute vulvitis: Secondary | ICD-10-CM

## 2019-10-27 NOTE — Patient Instructions (Signed)
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
## 2019-10-27 NOTE — Progress Notes (Signed)
Pt present for due to ED follow up. Pt stated that she was doing well no problems.

## 2019-12-25 ENCOUNTER — Ambulatory Visit (INDEPENDENT_AMBULATORY_CARE_PROVIDER_SITE_OTHER): Payer: Medicare Other | Admitting: Vascular Surgery

## 2019-12-25 ENCOUNTER — Other Ambulatory Visit: Payer: Self-pay

## 2019-12-25 ENCOUNTER — Ambulatory Visit (INDEPENDENT_AMBULATORY_CARE_PROVIDER_SITE_OTHER): Payer: Medicare Other

## 2019-12-25 ENCOUNTER — Encounter (INDEPENDENT_AMBULATORY_CARE_PROVIDER_SITE_OTHER): Payer: Self-pay | Admitting: Vascular Surgery

## 2019-12-25 VITALS — BP 156/76 | HR 73 | Resp 16

## 2019-12-25 DIAGNOSIS — N186 End stage renal disease: Secondary | ICD-10-CM

## 2019-12-25 DIAGNOSIS — T829XXS Unspecified complication of cardiac and vascular prosthetic device, implant and graft, sequela: Secondary | ICD-10-CM | POA: Diagnosis not present

## 2019-12-25 DIAGNOSIS — I713 Abdominal aortic aneurysm, ruptured, unspecified: Secondary | ICD-10-CM

## 2019-12-25 DIAGNOSIS — I739 Peripheral vascular disease, unspecified: Secondary | ICD-10-CM

## 2019-12-25 DIAGNOSIS — I10 Essential (primary) hypertension: Secondary | ICD-10-CM
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
## 2019-12-29 ENCOUNTER — Ambulatory Visit: Payer: Medicare Other | Admitting: Certified Nurse Midwife

## 2019-12-29 ENCOUNTER — Encounter (INDEPENDENT_AMBULATORY_CARE_PROVIDER_SITE_OTHER): Payer: Self-pay | Admitting: Vascular Surgery

## 2020-01-01 ENCOUNTER — Ambulatory Visit: Payer: Medicare Other | Admitting: Obstetrics & Gynecology

## 2020-01-19 ENCOUNTER — Encounter: Payer: Medicare Other | Admitting: Obstetrics and Gynecology

## 2020-02-14 ENCOUNTER — Encounter: Payer: Medicare Other | Admitting: Obstetrics and Gynecology

## 2020-04-17 ENCOUNTER — Encounter: Payer: Self-pay | Admitting: Obstetrics and Gynecology

## 2020-04-17 ENCOUNTER — Other Ambulatory Visit: Payer: Self-pay

## 2020-04-17 ENCOUNTER — Ambulatory Visit (INDEPENDENT_AMBULATORY_CARE_PROVIDER_SITE_OTHER): Payer: Medicare Other | Admitting: Obstetrics and Gynecology

## 2020-04-17 VITALS — BP 164/72 | HR 84 | Ht 64.0 in

## 2020-04-17 DIAGNOSIS — Z9189 Other specified personal risk factors, not elsewhere classified: Secondary | ICD-10-CM

## 2020-04-17 DIAGNOSIS — N891 Moderate vaginal dysplasia: Secondary | ICD-10-CM | POA: Diagnosis not present

## 2020-04-17 DIAGNOSIS — R921 Mammographic calcification found on diagnostic imaging of breast: Secondary | ICD-10-CM

## 2020-04-17 DIAGNOSIS — M810 Age-related osteoporosis without current pathological fracture: Secondary | ICD-10-CM

## 2020-04-17 DIAGNOSIS — N930 Postcoital and contact bleeding: Secondary | ICD-10-CM | POA: Diagnosis not present

## 2020-04-17 DIAGNOSIS — R69 Illness, unspecified: Secondary | ICD-10-CM

## 2020-04-17 DIAGNOSIS — Z1231 Encounter for screening mammogram for malignant neoplasm of breast: Secondary | ICD-10-CM

## 2020-04-17 NOTE — Progress Notes (Signed)
Pt present for annual exam. Pt stated that she was doing well.

## 2020-04-17 NOTE — Patient Instructions (Signed)
Preventive Care 72 Years and Older, Female Preventive care refers to lifestyle choices and visits with your health care provider that can promote health and wellness. This includes:  A yearly physical exam. This is also called an annual wellness visit.  Regular dental and eye exams.  Immunizations.  Screening for certain conditions.  Healthy lifestyle choices, such as: ? Eating a healthy diet. ? Getting regular exercise. ? Not using drugs or products that contain nicotine and tobacco. ? Limiting alcohol use. What can I expect for my preventive care visit? Physical exam Your health care provider will check your:  Height and weight. These may be used to calculate your BMI (body mass index). BMI is a measurement that tells if you are at a healthy weight.  Heart rate and blood pressure.  Body temperature.  Skin for abnormal spots. Counseling Your health care provider may ask you questions about your:  Past medical problems.  Family's medical history.  Alcohol, tobacco, and drug use.  Emotional well-being.  Home life and relationship well-being.  Sexual activity.  Diet, exercise, and sleep habits.  History of falls.  Memory and ability to understand (cognition).  Work and work Statistician.  Pregnancy and menstrual history.  Access to firearms. What immunizations do I need? Vaccines are usually given at various ages, according to a schedule. Your health care provider will recommend vaccines for you based on your age, medical history, and lifestyle or other factors, such as travel or where you work.   What tests do I need? Blood tests  Lipid and cholesterol levels. These may be checked every 5 years, or more often depending on your overall health.  Hepatitis C test.  Hepatitis B test. Screening  Lung cancer screening. You may have this screening every year starting at age 72 if you have a 30-pack-year history of smoking and currently smoke or have quit within  the past 15 years.  Colorectal cancer screening. ? All adults should have this screening starting at age 72 and continuing until age 72. ? Your health care provider may recommend screening at age 72 if you are at increased risk. ? You will have tests every 1-10 years, depending on your results and the type of screening test.  Diabetes screening. ? This is done by checking your blood sugar (glucose) after you have not eaten for a while (fasting). ? You may have this done every 1-3 years.  Mammogram. ? This may be done every 1-2 years. ? Talk with your health care provider about how often you should have regular mammograms.  Abdominal aortic aneurysm (AAA) screening. You may need this if you are a current or former smoker.  BRCA-related cancer screening. This may be done if you have a family history of breast, ovarian, tubal, or peritoneal cancers. Other tests  STD (sexually transmitted disease) testing, if you are at risk.  Bone density scan. This is done to screen for osteoporosis. You may have this done starting at age 72. Talk with your health care provider about your test results, treatment options, and if necessary, the need for more tests. Follow these instructions at home: Eating and drinking  Eat a diet that includes fresh fruits and vegetables, whole grains, lean protein, and low-fat dairy products. Limit your intake of foods with high amounts of sugar, saturated fats, and salt.  Take vitamin and mineral supplements as recommended by your health care provider.  Do not drink alcohol if your health care provider tells you not to drink.  If you drink alcohol: ? Limit how much you have to 0-1 drink a day. ? Be aware of how much alcohol is in your drink. In the [place] one drink equals one 12 oz bottle of beer (355 mL), one 5 oz glass of wine (148 mL), or one 1 oz glass of hard liquor (44 mL).   Lifestyle  Take daily care of your teeth and gums. Brush your teeth every morning  and night with fluoride toothpaste. Floss one time each day.  Stay active. Exercise for at least 30 minutes 5 or more days each week.  Do not use any products that contain nicotine or tobacco, such as cigarettes, e-cigarettes, and chewing tobacco. If you need help quitting, ask your health care provider.  Do not use drugs.  If you are sexually active, practice safe sex. Use a condom or other form of protection in order to prevent STIs (sexually transmitted infections).  Talk with your health care provider about taking a low-dose aspirin or statin.  Find healthy ways to cope with stress, such as: ? Meditation, yoga, or listening to music. ? Journaling. ? Talking to a trusted person. ? Spending time with friends and family. Safety  Always wear your seat belt while driving or riding in a vehicle.  Do not drive: ? If you have been drinking alcohol. Do not ride with someone who has been drinking. ? When you are tired or distracted. ? While texting.  Wear a helmet and other protective equipment during sports activities.  If you have firearms in your house, make sure you follow all gun safety procedures. What's next?  Visit your health care provider once a year for an annual wellness visit.  Ask your health care provider how often you should have your eyes and teeth checked.  Stay up to date on all vaccines. This information is not intended to replace advice given to you by your health care provider. Make sure you discuss any questions you have with your health care provider. Document Revised: 03/06/2020 Document Reviewed: 03/10/2018 Elsevier Patient Education  2021 [place] Breast self-awareness means being familiar with how your breasts look and feel. It involves checking your breasts regularly and reporting any changes to your health care provider. Practicing breast self-awareness is important. Sometimes changes may not be harmful (are benign),  but sometimes a change in your breasts can be a sign of a serious medical problem. It is important to learn how to do this procedure correctly so that you can catch problems early, when treatment is more likely to be successful. All women should practice breast self-awareness, including women who have had breast implants. What you need:  A mirror.  A well-lit room. How to do a breast self-exam A breast self-exam is one way to learn what is normal for your breasts and whether your breasts are changing. To do a breast self-exam: Look for changes 1. Remove all the clothing above your waist. 2. Stand in front of a mirror in a room with good lighting. 3. Put your hands on your hips. 4. Push your hands firmly downward. 5. Compare your breasts in the mirror. Look for differences between them (asymmetry), such as: ? Differences in shape. ? Differences in size. ? Puckers, dips, and bumps in one breast and not the other. 6. Look at each breast for changes in the skin, such as: ? Redness. ? Scaly areas. 7. Look for changes in your nipples, such as: ?  Discharge. ? Bleeding. ? Dimpling. ? Redness. ? A change in position.   Feel for changes Carefully feel your breasts for lumps and changes. It is best to do this while lying on your back on the floor, and again while sitting or standing in the tub or shower with soapy water on your skin. Feel each breast in the following way: 1. Place the arm on the side of the breast you are examining above your head. 2. Feel your breast with the other hand. 3. Start in the nipple area and make -inch (2 cm) overlapping circles to feel your breast. Use the pads of your three middle fingers to do this. Apply light pressure, then medium pressure, then firm pressure. The light pressure will allow you to feel the tissue closest to the skin. The medium pressure will allow you to feel the tissue that is a little deeper. The firm pressure will allow you to feel the tissue  close to the ribs. 4. Continue the overlapping circles, moving downward over the breast until you feel your ribs below your breast. 5. Move one finger-width toward the center of the body. Continue to use the -inch (2 cm) overlapping circles to feel your breast as you move slowly up toward your collarbone. 6. Continue the up-and-down exam using all three pressures until you reach your armpit.   Write down what you find Writing down what you find can help you remember what to discuss with your health care provider. Write down:  What is normal for each breast.  Any changes that you find in each breast, including: ? The kind of changes you find. ? Any pain or tenderness. ? Size and location of any lumps.  Where you are in your menstrual cycle, if you are still menstruating. General tips and recommendations  Examine your breasts every month.  If you are breastfeeding, the best time to examine your breasts is after a feeding or after using a breast pump.  If you menstruate, the best time to examine your breasts is 5-7 days after your period. Breasts are generally lumpier during menstrual periods, and it may be more difficult to notice changes.  With time and practice, you will become more familiar with the variations in your breasts and more comfortable with the exam. Contact a health care provider if you:  See a change in the shape or size of your breasts or nipples.  See a change in the skin of your breast or nipples, such as a reddened or scaly area.  Have unusual discharge from your nipples.  Find a lump or thick area that was not there before.  Have pain in your breasts.  Have any concerns related to your breast health. Summary  Breast self-awareness includes looking for physical changes in your breasts, as well as feeling for any changes within your breasts.  Breast self-awareness should be performed in front of a mirror in a well-lit room.  You should examine your breasts  every month. If you menstruate, the best time to examine your breasts is 5-7 days after your menstrual period.  Let your health care provider know of any changes you notice in your breasts, including changes in size, changes on the skin, pain or tenderness, or unusual fluid from your nipples. This information is not intended to replace advice given to you by your health care provider. Make sure you discuss any questions you have with your health care provider. Document Revised: 11/02/2017 Document Reviewed: 11/02/2017 Elsevier Patient Education  [place].
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
## 2020-04-19 ENCOUNTER — Encounter: Payer: Self-pay | Admitting: Obstetrics and Gynecology
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
## 2020-06-24 ENCOUNTER — Ambulatory Visit (INDEPENDENT_AMBULATORY_CARE_PROVIDER_SITE_OTHER): Payer: Medicare Other | Admitting: Vascular Surgery

## 2020-06-24 ENCOUNTER — Other Ambulatory Visit: Payer: Self-pay

## 2020-06-24 ENCOUNTER — Ambulatory Visit (INDEPENDENT_AMBULATORY_CARE_PROVIDER_SITE_OTHER): Payer: Medicare Other

## 2020-06-24 ENCOUNTER — Encounter (INDEPENDENT_AMBULATORY_CARE_PROVIDER_SITE_OTHER): Payer: Self-pay | Admitting: Vascular Surgery

## 2020-06-24 VITALS — BP 185/74 | HR 79 | Resp 16

## 2020-06-24 DIAGNOSIS — R194 Change in bowel habit: Secondary | ICD-10-CM | POA: Insufficient documentation

## 2020-06-24 DIAGNOSIS — T829XXS Unspecified complication of cardiac and vascular prosthetic device, implant and graft, sequela: Secondary | ICD-10-CM | POA: Diagnosis not present

## 2020-06-24 DIAGNOSIS — I739 Peripheral vascular disease, unspecified: Secondary | ICD-10-CM | POA: Diagnosis not present

## 2020-06-24 DIAGNOSIS — Z992 Dependence on renal dialysis: Secondary | ICD-10-CM | POA: Diagnosis not present

## 2020-06-24 DIAGNOSIS — I890 Lymphedema, not elsewhere classified: Secondary | ICD-10-CM

## 2020-06-24 DIAGNOSIS — I713 Abdominal aortic aneurysm, ruptured, unspecified: Secondary | ICD-10-CM

## 2020-06-24 DIAGNOSIS — N186 End stage renal disease: Secondary | ICD-10-CM | POA: Diagnosis not present

## 2020-06-24 DIAGNOSIS — K5730 Diverticulosis of large intestine without perforation or abscess without bleeding: Secondary | ICD-10-CM | POA: Insufficient documentation

## 2020-06-24 DIAGNOSIS — E785 Hyperlipidemia, unspecified: Secondary | ICD-10-CM

## 2020-06-24 DIAGNOSIS — J438 Other emphysema: Secondary | ICD-10-CM

## 2020-06-30 ENCOUNTER — Encounter (INDEPENDENT_AMBULATORY_CARE_PROVIDER_SITE_OTHER): Payer: Self-pay | Admitting: Vascular Surgery

## 2020-06-30 DIAGNOSIS — I890 Lymphedema, not elsewhere classified: Secondary | ICD-10-CM | POA: Insufficient documentation

## 2020-07-15 ENCOUNTER — Ambulatory Visit (INDEPENDENT_AMBULATORY_CARE_PROVIDER_SITE_OTHER): Payer: Medicare Other | Admitting: Vascular Surgery

## 2020-07-15 ENCOUNTER — Encounter (INDEPENDENT_AMBULATORY_CARE_PROVIDER_SITE_OTHER): Payer: Self-pay | Admitting: Vascular Surgery

## 2020-07-15 ENCOUNTER — Other Ambulatory Visit: Payer: Self-pay

## 2020-07-15 VITALS — BP 176/68 | HR 87 | Ht 64.0 in | Wt 109.0 lb

## 2020-07-15 DIAGNOSIS — I713 Abdominal aortic aneurysm, ruptured, unspecified: Secondary | ICD-10-CM

## 2020-07-15 DIAGNOSIS — I10 Essential (primary) hypertension: Secondary | ICD-10-CM | POA: Diagnosis not present

## 2020-07-15 DIAGNOSIS — T829XXS Unspecified complication of cardiac and vascular prosthetic device, implant and graft, sequela: Secondary | ICD-10-CM

## 2020-07-15 DIAGNOSIS — I7025 Atherosclerosis of native arteries of other extremities with ulceration: Secondary | ICD-10-CM

## 2020-07-15 DIAGNOSIS — N186 End stage renal disease: Secondary | ICD-10-CM

## 2020-07-15 MED ORDER — MUPIROCIN 2 % EX OINT: TOPICAL_OINTMENT | CUTANEOUS | 3 refills | Status: AC

## 2020-07-17 ENCOUNTER — Encounter (INDEPENDENT_AMBULATORY_CARE_PROVIDER_SITE_OTHER): Payer: Self-pay | Admitting: Vascular Surgery
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
## 2020-08-05 ENCOUNTER — Ambulatory Visit (INDEPENDENT_AMBULATORY_CARE_PROVIDER_SITE_OTHER): Payer: Medicare Other

## 2020-08-05 ENCOUNTER — Ambulatory Visit (INDEPENDENT_AMBULATORY_CARE_PROVIDER_SITE_OTHER): Payer: Medicare Other | Admitting: Vascular Surgery

## 2020-08-05 ENCOUNTER — Other Ambulatory Visit: Payer: Self-pay

## 2020-08-05 VITALS — BP 170/65 | HR 75 | Ht 64.0 in | Wt 109.0 lb

## 2020-08-05 DIAGNOSIS — I70219 Atherosclerosis of native arteries of extremities with intermittent claudication, unspecified extremity: Secondary | ICD-10-CM | POA: Insufficient documentation

## 2020-08-05 DIAGNOSIS — I713 Abdominal aortic aneurysm, ruptured, unspecified: Secondary | ICD-10-CM

## 2020-08-05 DIAGNOSIS — I70211 Atherosclerosis of native arteries of extremities with intermittent claudication, right leg: Secondary | ICD-10-CM | POA: Diagnosis not present

## 2020-08-05 DIAGNOSIS — I10 Essential (primary) hypertension: Secondary | ICD-10-CM

## 2020-08-05 DIAGNOSIS — I7025 Atherosclerosis of native arteries of other extremities with ulceration: Secondary | ICD-10-CM

## 2020-08-05 DIAGNOSIS — E785 Hyperlipidemia, unspecified: Secondary | ICD-10-CM

## 2020-08-05 DIAGNOSIS — N186 End stage renal disease: Secondary | ICD-10-CM
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
## 2020-08-07 ENCOUNTER — Encounter (INDEPENDENT_AMBULATORY_CARE_PROVIDER_SITE_OTHER): Payer: Self-pay | Admitting: Vascular Surgery

## 2020-10-21 ENCOUNTER — Ambulatory Visit (INDEPENDENT_AMBULATORY_CARE_PROVIDER_SITE_OTHER): Payer: Medicare Other | Admitting: Nurse Practitioner

## 2020-10-21 ENCOUNTER — Encounter (INDEPENDENT_AMBULATORY_CARE_PROVIDER_SITE_OTHER): Payer: Medicare Other

## 2020-11-02 ENCOUNTER — Emergency Department: Payer: Medicare Other

## 2020-11-02 ENCOUNTER — Inpatient Hospital Stay
Admission: EM | Admit: 2020-11-02 | Discharge: 2020-11-28 | DRG: 270 | Disposition: E | Payer: Medicare Other | Attending: Internal Medicine | Admitting: Internal Medicine

## 2020-11-02 ENCOUNTER — Other Ambulatory Visit: Payer: Self-pay

## 2020-11-02 ENCOUNTER — Encounter: Payer: Self-pay | Admitting: Emergency Medicine

## 2020-11-02 DIAGNOSIS — I9751 Accidental puncture and laceration of a circulatory system organ or structure during a circulatory system procedure: Secondary | ICD-10-CM | POA: Diagnosis not present

## 2020-11-02 DIAGNOSIS — E039 Hypothyroidism, unspecified: Secondary | ICD-10-CM | POA: Diagnosis present

## 2020-11-02 DIAGNOSIS — Y713 Surgical instruments, materials and cardiovascular devices (including sutures) associated with adverse incidents: Secondary | ICD-10-CM | POA: Diagnosis not present

## 2020-11-02 DIAGNOSIS — I890 Lymphedema, not elsewhere classified: Secondary | ICD-10-CM

## 2020-11-02 DIAGNOSIS — I208 Other forms of angina pectoris: Secondary | ICD-10-CM | POA: Diagnosis not present

## 2020-11-02 DIAGNOSIS — J9611 Chronic respiratory failure with hypoxia: Secondary | ICD-10-CM | POA: Diagnosis present

## 2020-11-02 DIAGNOSIS — K449 Diaphragmatic hernia without obstruction or gangrene: Secondary | ICD-10-CM | POA: Diagnosis present

## 2020-11-02 DIAGNOSIS — F17200 Nicotine dependence, unspecified, uncomplicated: Secondary | ICD-10-CM | POA: Diagnosis present

## 2020-11-02 DIAGNOSIS — J449 Chronic obstructive pulmonary disease, unspecified: Secondary | ICD-10-CM | POA: Diagnosis present

## 2020-11-02 DIAGNOSIS — Z823 Family history of stroke: Secondary | ICD-10-CM

## 2020-11-02 DIAGNOSIS — I214 Non-ST elevation (NSTEMI) myocardial infarction: Principal | ICD-10-CM

## 2020-11-02 DIAGNOSIS — I469 Cardiac arrest, cause unspecified: Secondary | ICD-10-CM | POA: Diagnosis not present

## 2020-11-02 DIAGNOSIS — Z803 Family history of malignant neoplasm of breast: Secondary | ICD-10-CM

## 2020-11-02 DIAGNOSIS — D75829 Heparin-induced thrombocytopenia, unspecified: Secondary | ICD-10-CM | POA: Diagnosis present

## 2020-11-02 DIAGNOSIS — Z8679 Personal history of other diseases of the circulatory system: Secondary | ICD-10-CM

## 2020-11-02 DIAGNOSIS — K661 Hemoperitoneum: Secondary | ICD-10-CM | POA: Diagnosis not present

## 2020-11-02 DIAGNOSIS — I97410 Intraoperative hemorrhage and hematoma of a circulatory system organ or structure complicating a cardiac catheterization: Secondary | ICD-10-CM | POA: Diagnosis not present

## 2020-11-02 DIAGNOSIS — I132 Hypertensive heart and chronic kidney disease with heart failure and with stage 5 chronic kidney disease, or end stage renal disease: Secondary | ICD-10-CM | POA: Diagnosis present

## 2020-11-02 DIAGNOSIS — K219 Gastro-esophageal reflux disease without esophagitis: Secondary | ICD-10-CM | POA: Diagnosis present

## 2020-11-02 DIAGNOSIS — Z8589 Personal history of malignant neoplasm of other organs and systems: Secondary | ICD-10-CM

## 2020-11-02 DIAGNOSIS — R079 Chest pain, unspecified: Secondary | ICD-10-CM | POA: Diagnosis present

## 2020-11-02 DIAGNOSIS — D631 Anemia in chronic kidney disease: Secondary | ICD-10-CM | POA: Diagnosis present

## 2020-11-02 DIAGNOSIS — D7582 Heparin induced thrombocytopenia (HIT): Secondary | ICD-10-CM | POA: Diagnosis present

## 2020-11-02 DIAGNOSIS — E785 Hyperlipidemia, unspecified: Secondary | ICD-10-CM | POA: Diagnosis present

## 2020-11-02 DIAGNOSIS — I724 Aneurysm of artery of lower extremity: Secondary | ICD-10-CM | POA: Diagnosis present

## 2020-11-02 DIAGNOSIS — Z87891 Personal history of nicotine dependence: Secondary | ICD-10-CM

## 2020-11-02 DIAGNOSIS — Z515 Encounter for palliative care: Secondary | ICD-10-CM | POA: Diagnosis not present

## 2020-11-02 DIAGNOSIS — M79605 Pain in left leg: Secondary | ICD-10-CM

## 2020-11-02 DIAGNOSIS — M79604 Pain in right leg: Secondary | ICD-10-CM

## 2020-11-02 DIAGNOSIS — J9621 Acute and chronic respiratory failure with hypoxia: Secondary | ICD-10-CM | POA: Diagnosis not present

## 2020-11-02 DIAGNOSIS — Z95828 Presence of other vascular implants and grafts: Secondary | ICD-10-CM

## 2020-11-02 DIAGNOSIS — N289 Disorder of kidney and ureter, unspecified: Secondary | ICD-10-CM

## 2020-11-02 DIAGNOSIS — C760 Malignant neoplasm of head, face and neck: Secondary | ICD-10-CM | POA: Diagnosis present

## 2020-11-02 DIAGNOSIS — Z79890 Hormone replacement therapy (postmenopausal): Secondary | ICD-10-CM

## 2020-11-02 DIAGNOSIS — J9602 Acute respiratory failure with hypercapnia: Secondary | ICD-10-CM | POA: Diagnosis not present

## 2020-11-02 DIAGNOSIS — I255 Ischemic cardiomyopathy: Secondary | ICD-10-CM | POA: Diagnosis present

## 2020-11-02 DIAGNOSIS — D62 Acute posthemorrhagic anemia: Secondary | ICD-10-CM | POA: Diagnosis present

## 2020-11-02 DIAGNOSIS — Z79899 Other long term (current) drug therapy: Secondary | ICD-10-CM

## 2020-11-02 DIAGNOSIS — Z978 Presence of other specified devices: Secondary | ICD-10-CM

## 2020-11-02 DIAGNOSIS — N186 End stage renal disease: Secondary | ICD-10-CM | POA: Diagnosis present

## 2020-11-02 DIAGNOSIS — E162 Hypoglycemia, unspecified: Secondary | ICD-10-CM

## 2020-11-02 DIAGNOSIS — Y658 Other specified misadventures during surgical and medical care: Secondary | ICD-10-CM | POA: Diagnosis not present

## 2020-11-02 DIAGNOSIS — N2581 Secondary hyperparathyroidism of renal origin: Secondary | ICD-10-CM | POA: Diagnosis present

## 2020-11-02 DIAGNOSIS — Z8049 Family history of malignant neoplasm of other genital organs: Secondary | ICD-10-CM

## 2020-11-02 DIAGNOSIS — I959 Hypotension, unspecified: Secondary | ICD-10-CM | POA: Diagnosis not present

## 2020-11-02 DIAGNOSIS — Z8249 Family history of ischemic heart disease and other diseases of the circulatory system: Secondary | ICD-10-CM

## 2020-11-02 DIAGNOSIS — Z888 Allergy status to other drugs, medicaments and biological substances status: Secondary | ICD-10-CM

## 2020-11-02 DIAGNOSIS — Z90710 Acquired absence of both cervix and uterus: Secondary | ICD-10-CM

## 2020-11-02 DIAGNOSIS — Z20822 Contact with and (suspected) exposure to covid-19: Secondary | ICD-10-CM | POA: Diagnosis present

## 2020-11-02 DIAGNOSIS — I739 Peripheral vascular disease, unspecified: Secondary | ICD-10-CM | POA: Diagnosis present

## 2020-11-02 DIAGNOSIS — Z992 Dependence on renal dialysis: Secondary | ICD-10-CM

## 2020-11-02 DIAGNOSIS — I5032 Chronic diastolic (congestive) heart failure: Secondary | ICD-10-CM | POA: Diagnosis present

## 2020-11-02 DIAGNOSIS — Z9981 Dependence on supplemental oxygen: Secondary | ICD-10-CM

## 2020-11-02 DIAGNOSIS — I25110 Atherosclerotic heart disease of native coronary artery with unstable angina pectoris: Secondary | ICD-10-CM | POA: Diagnosis present

## 2020-11-02 DIAGNOSIS — R578 Other shock: Secondary | ICD-10-CM | POA: Diagnosis not present

## 2020-11-02 DIAGNOSIS — Z7982 Long term (current) use of aspirin: Secondary | ICD-10-CM

## 2020-11-02 DIAGNOSIS — I2089 Other forms of angina pectoris: Secondary | ICD-10-CM

## 2020-11-02 DIAGNOSIS — Z66 Do not resuscitate: Secondary | ICD-10-CM | POA: Diagnosis not present

## 2020-11-02 DIAGNOSIS — E872 Acidosis: Secondary | ICD-10-CM | POA: Diagnosis not present

## 2020-11-02 DIAGNOSIS — Z886 Allergy status to analgesic agent status: Secondary | ICD-10-CM

## 2020-11-02 LAB — TROPONIN I (HIGH SENSITIVITY)
Troponin I (High Sensitivity): 433 ng/L (ref <18)
Troponin I (High Sensitivity): 459 ng/L (ref <18)
Troponin I (High Sensitivity): 408 ng/L (ref <18)
Troponin I (High Sensitivity): 411 ng/L (ref <18)

## 2020-11-02 LAB — BASIC METABOLIC PANEL
Anion gap: 12 (ref 5–15)
BUN: 7 mg/dL — ABNORMAL LOW (ref 8–23)
CO2: 28 mmol/L (ref 22–32)
Calcium: 8.6 mg/dL — ABNORMAL LOW (ref 8.9–10.3)
Chloride: 96 mmol/L — ABNORMAL LOW (ref 98–111)
Creatinine, Ser: 2.07 mg/dL — ABNORMAL HIGH (ref 0.44–1.00)
GFR, Estimated: 25 mL/min — ABNORMAL LOW (ref >60)
Glucose, Bld: 121 mg/dL — ABNORMAL HIGH (ref 70–99)
Potassium: 3.3 mmol/L — ABNORMAL LOW (ref 3.5–5.1)
Sodium: 136 mmol/L (ref 135–145)

## 2020-11-02 LAB — CBC
HCT: 33 % — ABNORMAL LOW (ref 36.0–46.0)
Hemoglobin: 11.7 g/dL — ABNORMAL LOW (ref 12.0–15.0)
MCH: 38.9 pg — ABNORMAL HIGH (ref 26.0–34.0)
MCHC: 35.5 g/dL (ref 30.0–36.0)
MCV: 109.6 fL — ABNORMAL HIGH (ref 80.0–100.0)
Platelets: 156 10*3/uL (ref 150–400)
RBC: 3.01 MIL/uL — ABNORMAL LOW (ref 3.87–5.11)
RDW: 13.8 % (ref 11.5–15.5)
WBC: 7.2 10*3/uL (ref 4.0–10.5)
nRBC: 0 % (ref 0.0–0.2)

## 2020-11-02 LAB — PROTIME-INR
INR: 1 (ref 0.8–1.2)
Prothrombin Time: 13.5 seconds (ref 11.4–15.2)

## 2020-11-02 MED ORDER — DOCUSATE SODIUM 100 MG PO CAPS: 100.0000 mg | ORAL_CAPSULE | Freq: Every day | ORAL | Status: DC | PRN

## 2020-11-02 MED ORDER — BIOTIN 5000 MCG PO CAPS: 5000.0000 ug | ORAL_CAPSULE | Freq: Every day | ORAL | Status: DC

## 2020-11-02 MED ORDER — PANTOPRAZOLE SODIUM 40 MG PO TBEC
40.0000 mg | DELAYED_RELEASE_TABLET | Freq: Every day | ORAL | Status: DC
  Administered 2020-11-02 – 2020-11-04 (×3): 40 mg via ORAL
  Filled 2020-11-02 (×3): qty 1

## 2020-11-02 MED ORDER — RENA-VITE PO TABS
1.0000 | ORAL_TABLET | Freq: Every day | ORAL | Status: DC
  Administered 2020-11-02 – 2020-11-04 (×3): 1 via ORAL
  Filled 2020-11-02 (×4): qty 1

## 2020-11-02 MED ORDER — ONDANSETRON HCL 4 MG/2ML IJ SOLN: 4.0000 mg | Freq: Four times a day (QID) | INTRAMUSCULAR | Status: DC | PRN

## 2020-11-02 MED ORDER — ASPIRIN 325 MG PO TABS
325.0000 mg | ORAL_TABLET | Freq: Every day | ORAL | Status: DC
  Administered 2020-11-02 – 2020-11-03 (×2): 325 mg via ORAL
  Filled 2020-11-02 (×3): qty 1

## 2020-11-02 MED ORDER — ASPIRIN EC 81 MG PO TBEC: 81.0000 mg | DELAYED_RELEASE_TABLET | Freq: Every day | ORAL | Status: DC

## 2020-11-02 MED ORDER — NITROGLYCERIN 2 % TD OINT
0.5000 [in_us] | TOPICAL_OINTMENT | Freq: Four times a day (QID) | TRANSDERMAL | Status: DC
  Administered 2020-11-02 – 2020-11-03 (×5): 0.5 [in_us] via TOPICAL
  Filled 2020-11-02 (×5): qty 1

## 2020-11-02 MED ORDER — GABAPENTIN 100 MG PO CAPS
100.0000 mg | ORAL_CAPSULE | Freq: Three times a day (TID) | ORAL | Status: DC | PRN
  Administered 2020-11-03 – 2020-11-04 (×2): 100 mg via ORAL
  Filled 2020-11-02 (×2): qty 1

## 2020-11-02 MED ORDER — NITROGLYCERIN 0.4 MG SL SUBL
0.4000 mg | SUBLINGUAL_TABLET | SUBLINGUAL | Status: DC | PRN
Start: 2020-11-02 — End: 2020-11-05
  Administered 2020-11-02 – 2020-11-03 (×3): 0.4 mg via SUBLINGUAL
  Filled 2020-11-02 (×3): qty 1

## 2020-11-02 MED ORDER — ATORVASTATIN CALCIUM 20 MG PO TABS
40.0000 mg | ORAL_TABLET | Freq: Every day | ORAL | Status: DC
  Administered 2020-11-02 – 2020-11-03 (×2): 40 mg via ORAL
  Filled 2020-11-02 (×3): qty 2

## 2020-11-02 MED ORDER — ASPIRIN 300 MG RE SUPP: 300.0000 mg | RECTAL | Status: AC

## 2020-11-02 MED ORDER — LEVOTHYROXINE SODIUM 100 MCG PO TABS
100.0000 ug | ORAL_TABLET | Freq: Every day | ORAL | Status: DC
  Administered 2020-11-03 – 2020-11-04 (×2): 100 ug via ORAL
  Filled 2020-11-02: qty 1
  Filled 2020-11-02: qty 2

## 2020-11-02 MED ORDER — ACETAMINOPHEN 325 MG PO TABS
650.0000 mg | ORAL_TABLET | ORAL | Status: DC | PRN
  Administered 2020-11-02 – 2020-11-04 (×5): 650 mg via ORAL
  Filled 2020-11-02 (×5): qty 2

## 2020-11-02 MED ORDER — METOPROLOL TARTRATE 25 MG PO TABS
25.0000 mg | ORAL_TABLET | Freq: Every day | ORAL | Status: DC
  Administered 2020-11-02 – 2020-11-03 (×2): 25 mg via ORAL
  Filled 2020-11-02 (×2): qty 1

## 2020-11-02 MED ORDER — ASPIRIN 81 MG PO CHEW: 324.0000 mg | CHEWABLE_TABLET | ORAL | Status: AC

## 2020-11-02 MED ORDER — CLOPIDOGREL BISULFATE 75 MG PO TABS
75.0000 mg | ORAL_TABLET | Freq: Every day | ORAL | Status: DC
  Administered 2020-11-03: 75 mg via ORAL
  Filled 2020-11-02: qty 1

## 2020-11-02 MED ORDER — CYCLOBENZAPRINE HCL 5 MG PO TABS
5.0000 mg | ORAL_TABLET | Freq: Every day | ORAL | Status: DC | PRN
  Administered 2020-11-03 – 2020-11-04 (×2): 5 mg via ORAL
  Filled 2020-11-02 (×3): qty 1

## 2020-11-02 MED ORDER — CLOPIDOGREL BISULFATE 75 MG PO TABS
75.0000 mg | ORAL_TABLET | Freq: Once | ORAL | Status: AC
  Administered 2020-11-02: 75 mg via ORAL
  Filled 2020-11-02: qty 1

## 2020-11-02 MED ORDER — LIDOCAINE-PRILOCAINE 2.5-2.5 % EX CREA
1.0000 "application " | TOPICAL_CREAM | CUTANEOUS | Status: DC | PRN
  Filled 2020-11-02: qty 5

## 2020-11-02 NOTE — ED Notes (Signed)
Patient keeps c/o left sided chest discomfort that she describes as aching/pressure that sometimes takes her breath. subsides after about 2-3 minutes. Has had multiple episodes. I gave her a nitro sub. Secure chat message sent to MD Agbata
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
## 2020-11-02 NOTE — ED Triage Notes (Signed)
Pt reports that she is having chest pain for about a week now. She has a blockage in her heart, she also is concerned it could be GERD. She has not been able to sleep because of the pain. She is having pain between her shoulder blades, she has had some SHOB. Denies N/V
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
## 2020-11-02 NOTE — ED Notes (Signed)
Pt not complaining of any pain at this time, resting watching tv

## 2020-11-02 NOTE — ED Notes (Signed)
Pt called husband, is going to try to rest. No pain at this time

## 2020-11-02 NOTE — ED Notes (Signed)
Troponin 459 relayed to MD [place]

## 2020-11-03 ENCOUNTER — Other Ambulatory Visit: Payer: Self-pay

## 2020-11-03 DIAGNOSIS — I5032 Chronic diastolic (congestive) heart failure: Secondary | ICD-10-CM

## 2020-11-03 DIAGNOSIS — J449 Chronic obstructive pulmonary disease, unspecified: Secondary | ICD-10-CM

## 2020-11-03 DIAGNOSIS — N186 End stage renal disease: Secondary | ICD-10-CM

## 2020-11-03 LAB — CBC
HCT: 30.8 % — ABNORMAL LOW (ref 36.0–46.0)
Hemoglobin: 11.1 g/dL — ABNORMAL LOW (ref 12.0–15.0)
MCH: 39.4 pg — ABNORMAL HIGH (ref 26.0–34.0)
MCHC: 36 g/dL (ref 30.0–36.0)
MCV: 109.2 fL — ABNORMAL HIGH (ref 80.0–100.0)
Platelets: 138 10*3/uL — ABNORMAL LOW (ref 150–400)
RBC: 2.82 MIL/uL — ABNORMAL LOW (ref 3.87–5.11)
RDW: 13.6 % (ref 11.5–15.5)
WBC: 8.4 10*3/uL (ref 4.0–10.5)
nRBC: 0 % (ref 0.0–0.2)

## 2020-11-03 LAB — BASIC METABOLIC PANEL
Anion gap: 13 (ref 5–15)
BUN: 14 mg/dL (ref 8–23)
CO2: 28 mmol/L (ref 22–32)
Calcium: 9.3 mg/dL (ref 8.9–10.3)
Chloride: 95 mmol/L — ABNORMAL LOW (ref 98–111)
Creatinine, Ser: 3.71 mg/dL — ABNORMAL HIGH (ref 0.44–1.00)
GFR, Estimated: 12 mL/min — ABNORMAL LOW (ref >60)
Glucose, Bld: 76 mg/dL (ref 70–99)
Potassium: 3.5 mmol/L (ref 3.5–5.1)
Sodium: 136 mmol/L (ref 135–145)

## 2020-11-03 LAB — SARS CORONAVIRUS 2 (TAT 6-24 HRS): SARS Coronavirus 2: NEGATIVE

## 2020-11-03 MED ORDER — SODIUM CHLORIDE 0.9 % IV SOLN
250.0000 mL | INTRAVENOUS | Status: DC | PRN
  Administered 2020-11-04: 250 mL via INTRAVENOUS

## 2020-11-03 MED ORDER — SODIUM CHLORIDE 0.9% FLUSH
3.0000 mL | Freq: Two times a day (BID) | INTRAVENOUS | Status: DC
  Administered 2020-11-03 – 2020-11-05 (×3): 3 mL via INTRAVENOUS

## 2020-11-03 MED ORDER — SODIUM CHLORIDE 0.9% FLUSH: 3.0000 mL | INTRAVENOUS | Status: DC | PRN

## 2020-11-03 MED ORDER — SODIUM CHLORIDE 0.9 % WEIGHT BASED INFUSION: 3.0000 mL/kg/h | INTRAVENOUS | Status: AC

## 2020-11-03 MED ORDER — ASPIRIN 81 MG PO CHEW
81.0000 mg | CHEWABLE_TABLET | ORAL | Status: AC
  Administered 2020-11-04: 81 mg via ORAL
  Filled 2020-11-03: qty 1

## 2020-11-03 MED ORDER — SODIUM CHLORIDE 0.9 % WEIGHT BASED INFUSION: 1.0000 mL/kg/h | INTRAVENOUS | Status: DC

## 2020-11-03 NOTE — Progress Notes (Signed)
[place] [facility] [place] NAME: [name]    MR#:  [id number]  PCP: Baxter Hire, MD  DATE OF BIRTH:  Oct 29, 1950  SUBJECTIVE:  CHIEF COMPLAINT:   Chief Complaint  Patient presents with   Chest Pain    REVIEW OF SYSTEMS:  ROS DRUG ALLERGIES:   Allergies  Allergen Reactions   Heparin Other (See Comments)    HIT antibody positive    VITALS:  Blood pressure (!) 157/61, pulse 90, temperature 98.2 F (36.8 C), temperature source Oral, resp. rate 19, height 5' 4.5" (1.638 m), weight 49 kg, last menstrual period 03/30/1988, SpO2 94 %. PHYSICAL EXAMINATION:  Physical Exam LABORATORY PANEL:  Female CBC Recent Labs  Lab 11/03/20 0538  WBC 8.4  HGB 11.1*  HCT 30.8*  PLT 138*   ------------------------------------------------------------------------------------------------------------------ Chemistries  Recent Labs  Lab 11/03/20 0538  NA 136  K 3.5  CL 95*  CO2 28  GLUCOSE 76  BUN 14  CREATININE 3.71*  CALCIUM 9.3   MEDICATIONS:  Scheduled Meds:  aspirin  325 mg Oral Daily   atorvastatin  40 mg Oral Daily   clopidogrel  75 mg Oral Daily   levothyroxine  100 mcg Oral QAC breakfast   metoprolol tartrate  25 mg Oral q1800   multivitamin  1 tablet Oral QHS   nitroGLYCERIN  0.5 inch Topical Q6H   pantoprazole  40 mg Oral QHS   sodium chloride flush  3 mL Intravenous Q12H   Continuous Infusions: RADIOLOGY:  No results found. ASSESSMENT AND PLAN:  70 y.o. female with medical history significant for coronary artery disease, end-stage renal disease on hemodialysis TTS, history of ruptured AAA s/p repair, history of COPD, recently diagnosed head and neck cancer, hypertension and hypothyroidism admitted for unstable angina.  ED Course: EKG shows T wave inversions in the inferior lateral leads and her initial troponin is elevated. Patient received a dose of aspirin and Plavix in the ER as she is unable to receive heparin.  Principal  Problem:   NSTEMI (non-ST elevated myocardial infarction) ([place]) Active Problems:   Hypothyroidism   HIT (heparin-induced thrombocytopenia) (HCC)   ESRD (end stage renal disease) ([place])   Chronic respiratory failure with hypoxia (HCC)   Chronic obstructive pulmonary disease, unspecified (HCC)   Chronic diastolic CHF (congestive heart failure) (HCC)  Non-ST elevation MI/Unstable Angina Troponin peaked at 459 Patient has a history of coronary artery disease and presents for evaluation of chest pain mostly over the anterior chest wall with radiation to the left arm and occasionally between her shoulder blades associated with shortness of breath. - patient is chest pain free now. Twelve-lead EKG shows T wave inversions in inferior lateral leads and her initial troponin is elevated at 459 Patient has a history of HIT and therefore cannot be anticoagulated with heparin Continue aspirin and Plavix per cardiology recommendation Continue statins and metoprolol Nitrates for chest pain Obtain 2D echocardiogram assess LVEF and rule out regional wall motion abnormality Cardio Dr Nehemiah Massed planning cath tomorrow per patient.   End-stage renal disease on hemodialysis TTS HD per nephro    Hypothyroidism Continue Synthroid   Hypertension Continue metoprolol   COPD with chronic respiratory failure Not acutely exacerbated   Body mass index is 18.25 kg/m.  Net IO Since Admission: -1 mL [11/03/20 1644]      LOS: 1 day   Consultants: Cardiology Nephrology    Antibiotics: None   Status is: Inpatient  Remains inpatient appropriate because:Ongoing diagnostic testing  needed not appropriate for outpatient work up  Dispo: The patient is from: Home              Anticipated d/c is to: Home              Patient currently is not medically stable to d/c.   Difficult to place patient No    DVT prophylaxis:   SCD      Family Communication:  "discussed with patient"   All the records are  reviewed and case discussed with Nursing and TOC team. Management plans discussed with the patient, Nursing and they are in agreement.  CODE STATUS: Full Code Level of care: Progressive Cardiac  TOTAL TIME TAKING CARE OF THIS PATIENT: 35 minutes.   More than 50% of the time was spent in counseling/coordination of care: YES  POSSIBLE D/C IN 2-3 DAYS, DEPENDING ON CLINICAL CONDITION. & cardiac work up   Max Sane M.D on 11/03/2020 at 4:44 PM  [facility]   CC: Primary care physician; Baxter Hire, MD  Note: This dictation was prepared with Dragon dictation along with smaller phrase technology. Any transcriptional errors that result from this process are unintentional.

## 2020-11-03 NOTE — ED Notes (Signed)
Received report from April RN.

## 2020-11-03 NOTE — ED Notes (Signed)
Pt moved into hospital bed for comfort. Pt denies any needs at this time.

## 2020-11-03 NOTE — ED Notes (Signed)
Pt states feeling much better after SL nitro

## 2020-11-03 NOTE — Plan of Care (Signed)

## 2020-11-03 NOTE — ED Notes (Signed)
Report from lou, rn.

## 2020-11-03 NOTE — Progress Notes (Signed)
[facility]  ROUNDING NOTE   Subjective:   [name] is a 70 y.o. female with past medical history of CAD, ruptured AAA with repair, COPD, recently diagnosed head and neck cancer, hypertension, hypothyroidism and ESRD on HD. She reports to the ED with complaints of chest pain for the past week. She is being admitted for NSTEMI (non-ST elevated myocardial infarction) [facility]) [I21.4]  Patient is known to this practice from previous admission and currently receives dialysis at [facility], monitored by [facility]. We have been consulted to manage dialysis needs during this admission. She did receive a full treatment on Tuesday. Chest pain was described as intermittent 7/10 pain. It would start in left chest then radiate sometimes to left arm or between shoulder blades. She states her chest pain has improved with Nitro cream. Denies nausea and vomiting, Troponin 433 on arrival, with elevation to 459. Cardiology consulted.   Objective:  Vital signs in last 24 hours:  Pulse Rate:  [72-101] 95 (08/07 1400) Resp:  [10-37] 14 (08/07 1400) BP: (116-162)/(46-86) 157/61 (08/07 1400) SpO2:  [84 %-100 %] 90 % (08/07 1400)  Weight change:  Filed Weights   11/10/2020 1323  Weight: 49 kg    Intake/Output: No intake/output data recorded.   Intake/Output this shift:  Total I/O In: -  Out: 1 [Stool:1]  Physical Exam: General: NAD, resting in bed  Head: Normocephalic, atraumatic. Moist oral mucosal membranes  Eyes: Anicteric  Neck: Supple  Lungs:  Minimal scattered rhonchi, normal effort, 2L [place]  Heart: Regular rate and rhythm  Abdomen:  Soft, nontender  Extremities:  no peripheral edema.  Neurologic: Nonfocal, moving all four extremities  Skin: No lesions  Access: Lt AVG    Basic Metabolic Panel: Recent Labs  Lab 11/12/2020 1325 11/03/20 0538  NA 136 136  K 3.3* 3.5  CL 96* 95*  CO2 28 28  GLUCOSE 121* 76  BUN 7* 14  CREATININE 2.07* 3.71*  CALCIUM 8.6* 9.3     Liver Function Tests: No results for input(s): AST, ALT, ALKPHOS, BILITOT, PROT, ALBUMIN in the last 168 hours. No results for input(s): LIPASE, AMYLASE in the last 168 hours. No results for input(s): AMMONIA in the last 168 hours.  CBC: Recent Labs  Lab 11/01/2020 1325 11/03/20 0538  WBC 7.2 8.4  HGB 11.7* 11.1*  HCT 33.0* 30.8*  MCV 109.6* 109.2*  PLT 156 138*    Cardiac Enzymes: No results for input(s): CKTOTAL, CKMB, CKMBINDEX, TROPONINI in the last 168 hours.  BNP: Invalid input(s): POCBNP  CBG: No results for input(s): GLUCAP in the last 168 hours.  Microbiology: Results for orders placed or performed during the hospital encounter of 11/04/2020  SARS CORONAVIRUS 2 (TAT 6-24 HRS) Nasopharyngeal Nasopharyngeal Swab     Status: None   Collection Time: 11/27/2020  9:45 PM   Specimen: Nasopharyngeal Swab  Result Value Ref Range Status   SARS Coronavirus 2 NEGATIVE NEGATIVE Final    Comment: (NOTE) SARS-CoV-2 target nucleic acids are NOT DETECTED.  The SARS-CoV-2 RNA is generally detectable in upper and lower respiratory specimens during the acute phase of infection. Negative results do not preclude SARS-CoV-2 infection, do not rule out co-infections with other pathogens, and should not be used as the sole basis for treatment or other patient management decisions. Negative results must be combined with clinical observations, patient history, and epidemiological information. The expected result is Negative.  Fact Sheet for Patients: SugarRoll.be  Fact Sheet for Healthcare Providers: https://www.woods-mathews.com/  This  test is not yet approved or cleared by the [place] and  has been authorized for detection and/or diagnosis of SARS-CoV-2 by FDA under an Emergency Use Authorization (EUA). This EUA will remain  in effect (meaning this test can be used) for the duration of the COVID-19 declaration under Se ction  564(b)(1) of the Act, 21 U.S.C. section 360bbb-3(b)(1), unless the authorization is terminated or revoked sooner.  Performed at [facility], [place] [address] [place], [place] [address]     Coagulation Studies: Recent Labs    11/20/2020 1325  LABPROT 13.5  INR 1.0    Urinalysis: No results for input(s): COLORURINE, LABSPEC, PHURINE, GLUCOSEU, HGBUR, BILIRUBINUR, KETONESUR, PROTEINUR, UROBILINOGEN, NITRITE, LEUKOCYTESUR in the last 72 hours.  Invalid input(s): APPERANCEUR    Imaging: DG Chest 2 View  Result Date: 11/03/2020 CLINICAL DATA:  70 year old female with chest pain. EXAM: CHEST - 2 VIEW COMPARISON:  12/21/2016 FINDINGS: The mediastinal contours are within normal limits. No cardiomegaly. Atherosclerotic calcification of the aortic arch. Similar appearing emphysematous changes. The lungs are clear bilaterally without evidence of focal consolidation, pleural effusion, or pneumothorax. Left upper extremity venous stents in place. Partially visualized abdominal aortic EVAR stent graft. Partially visualized anterior wedge compression deformity of a lower thoracic vertebral, unchanged no acute osseous abnormality. IMPRESSION: No acute cardiopulmonary process. Aortic Atherosclerosis (ICD10-I70.0) and Emphysema (ICD10-J43.9). Electronically Signed   By: Ruthann Cancer MD   On: 11/04/2020 14:09     Medications:     aspirin  324 mg Oral NOW   Or   aspirin  300 mg Rectal NOW   aspirin  325 mg Oral Daily   atorvastatin  40 mg Oral Daily   clopidogrel  75 mg Oral Daily   levothyroxine  100 mcg Oral QAC breakfast   metoprolol tartrate  25 mg Oral q1800   multivitamin  1 tablet Oral QHS   nitroGLYCERIN  0.5 inch Topical Q6H   pantoprazole  40 mg Oral QHS   sodium chloride flush  3 mL Intravenous Q12H   acetaminophen, cyclobenzaprine, docusate sodium, gabapentin, lidocaine-prilocaine, nitroGLYCERIN, ondansetron (ZOFRAN) IV  Assessment/ Plan:  Ms. [name] is a 70 y.o.   female with past medical history of CAD, ruptured AAA with repair, COPD, recently diagnosed head and neck cancer, hypertension, hypothyroidism and ESRD on HD. She reports to the ED with complaints of chest pain for the past week. She is being admitted for NSTEMI (non-ST elevated myocardial infarction) ([place]) [I21.4]   [facility] [address]/TTS/Lt AVG  End stage renal disease on dialysis Will maintain outpatient schedule if possible Received full treatment yesterday, prior to admission Next treatment scheduled for Tuesday  2. Anemia of chronic kidney disease  Lab Results  Component Value Date   HGB 11.1 (L) 11/03/2020    Hgb above target  3. Secondary Hyperparathyroidism:  Lab Results  Component Value Date   PTH 31 06/18/2015   CALCIUM 9.3 11/03/2020   CAION 1.20 03/21/2019   PHOS 3.8 10/21/2019   Calcium at goal Will check phosphorus with morning labs  4. Chest pain, NSTEMI Cardiac cath scheduled tomorrow Cardiology following    LOS: 1 Roland 8/7/20222:22 PM

## 2020-11-03 NOTE — ED Notes (Signed)
Pt complaining of 8/10 chest pain- see MAR for intervention
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
## 2020-11-03 NOTE — ED Notes (Signed)
Complaining of lower back pain

## 2020-11-03 NOTE — ED Notes (Signed)
Report given to Ariel RN

## 2020-11-04 ENCOUNTER — Inpatient Hospital Stay: Admission: EM | Disposition: E | Payer: Self-pay | Source: Home / Self Care | Attending: Internal Medicine

## 2020-11-04 ENCOUNTER — Inpatient Hospital Stay: Payer: Medicare Other

## 2020-11-04 ENCOUNTER — Other Ambulatory Visit: Payer: Self-pay

## 2020-11-04 ENCOUNTER — Encounter: Payer: Self-pay | Admitting: Internal Medicine

## 2020-11-04 DIAGNOSIS — I208 Other forms of angina pectoris: Secondary | ICD-10-CM | POA: Diagnosis present

## 2020-11-04 DIAGNOSIS — I97410 Intraoperative hemorrhage and hematoma of a circulatory system organ or structure complicating a cardiac catheterization: Secondary | ICD-10-CM

## 2020-11-04 DIAGNOSIS — I214 Non-ST elevation (NSTEMI) myocardial infarction: Secondary | ICD-10-CM | POA: Diagnosis not present

## 2020-11-04 DIAGNOSIS — D7582 Heparin induced thrombocytopenia (HIT): Secondary | ICD-10-CM

## 2020-11-04 DIAGNOSIS — I959 Hypotension, unspecified: Secondary | ICD-10-CM

## 2020-11-04 HISTORY — PX: LEFT HEART CATH AND CORONARY ANGIOGRAPHY: CATH118249

## 2020-11-04 HISTORY — PX: EMBOLIZATION: CATH118239

## 2020-11-04 LAB — GLUCOSE, CAPILLARY
Glucose-Capillary: 66 mg/dL — ABNORMAL LOW (ref 70–99)
Glucose-Capillary: 109 mg/dL — ABNORMAL HIGH (ref 70–99)
Glucose-Capillary: 105 mg/dL — ABNORMAL HIGH (ref 70–99)
Glucose-Capillary: 97 mg/dL (ref 70–99)
Glucose-Capillary: 88 mg/dL (ref 70–99)

## 2020-11-04 LAB — CBC WITH DIFFERENTIAL/PLATELET
Abs Immature Granulocytes: 0.24 10*3/uL — ABNORMAL HIGH (ref 0.00–0.07)
Basophils Absolute: 0.1 10*3/uL (ref 0.0–0.1)
Basophils Relative: 0 %
Eosinophils Absolute: 0 10*3/uL (ref 0.0–0.5)
Eosinophils Relative: 0 %
HCT: 22.8 % — ABNORMAL LOW (ref 36.0–46.0)
Hemoglobin: 7.7 g/dL — ABNORMAL LOW (ref 12.0–15.0)
Immature Granulocytes: 1 %
Lymphocytes Relative: 5 %
Lymphs Abs: 1.2 10*3/uL (ref 0.7–4.0)
MCH: 39.9 pg — ABNORMAL HIGH (ref 26.0–34.0)
MCHC: 33.8 g/dL (ref 30.0–36.0)
MCV: 118.1 fL — ABNORMAL HIGH (ref 80.0–100.0)
Monocytes Absolute: 1 10*3/uL (ref 0.1–1.0)
Monocytes Relative: 4 %
Neutro Abs: 21.1 10*3/uL — ABNORMAL HIGH (ref 1.7–7.7)
Neutrophils Relative %: 90 %
Platelets: 255 10*3/uL (ref 150–400)
RBC: 1.93 MIL/uL — ABNORMAL LOW (ref 3.87–5.11)
RDW: 14.2 % (ref 11.5–15.5)
Smear Review: NORMAL
WBC: 23.7 10*3/uL — ABNORMAL HIGH (ref 4.0–10.5)
nRBC: 0.1 % (ref 0.0–0.2)

## 2020-11-04 LAB — BASIC METABOLIC PANEL
Anion gap: 18 — ABNORMAL HIGH (ref 5–15)
BUN: 23 mg/dL (ref 8–23)
CO2: 24 mmol/L (ref 22–32)
Calcium: 9.2 mg/dL (ref 8.9–10.3)
Chloride: 94 mmol/L — ABNORMAL LOW (ref 98–111)
Creatinine, Ser: 5.36 mg/dL — ABNORMAL HIGH (ref 0.44–1.00)
GFR, Estimated: 8 mL/min — ABNORMAL LOW (ref >60)
Glucose, Bld: 60 mg/dL — ABNORMAL LOW (ref 70–99)
Potassium: 3.9 mmol/L (ref 3.5–5.1)
Sodium: 136 mmol/L (ref 135–145)

## 2020-11-04 LAB — CBC
HCT: 31.9 % — ABNORMAL LOW (ref 36.0–46.0)
Hemoglobin: 11.3 g/dL — ABNORMAL LOW (ref 12.0–15.0)
MCH: 38 pg — ABNORMAL HIGH (ref 26.0–34.0)
MCHC: 35.4 g/dL (ref 30.0–36.0)
MCV: 107.4 fL — ABNORMAL HIGH (ref 80.0–100.0)
Platelets: 142 10*3/uL — ABNORMAL LOW (ref 150–400)
RBC: 2.97 MIL/uL — ABNORMAL LOW (ref 3.87–5.11)
RDW: 13.6 % (ref 11.5–15.5)
WBC: 8.2 10*3/uL (ref 4.0–10.5)
nRBC: 0 % (ref 0.0–0.2)

## 2020-11-04 LAB — HEMOGLOBIN AND HEMATOCRIT, BLOOD
HCT: 34.5 % — ABNORMAL LOW (ref 36.0–46.0)
Hemoglobin: 12.1 g/dL (ref 12.0–15.0)

## 2020-11-04 LAB — PROCALCITONIN: Procalcitonin: 0.64 ng/mL

## 2020-11-04 LAB — MRSA NEXT GEN BY PCR, NASAL: MRSA by PCR Next Gen: NOT DETECTED

## 2020-11-04 LAB — LACTIC ACID, PLASMA
Lactic Acid, Venous: 5.4 mmol/L (ref 0.5–1.9)
Lactic Acid, Venous: 10.7 mmol/L (ref 0.5–1.9)

## 2020-11-04 SURGERY — LEFT HEART CATH AND CORONARY ANGIOGRAPHY
Anesthesia: Moderate Sedation

## 2020-11-04 MED ORDER — MORPHINE SULFATE (PF) 2 MG/ML IV SOLN
INTRAVENOUS | Status: AC
  Filled 2020-11-04: qty 1

## 2020-11-04 MED ORDER — SODIUM CHLORIDE 0.9 % IV SOLN
INTRAVENOUS | Status: AC | PRN
  Administered 2020-11-04: 250 mL via INTRAVENOUS

## 2020-11-04 MED ORDER — FENTANYL CITRATE (PF) 100 MCG/2ML IJ SOLN
25.0000 ug | INTRAMUSCULAR | Status: DC | PRN
  Administered 2020-11-04 – 2020-11-05 (×3): 50 ug via INTRAVENOUS
  Filled 2020-11-04 (×3): qty 2

## 2020-11-04 MED ORDER — FENTANYL CITRATE (PF) 100 MCG/2ML IJ SOLN
INTRAMUSCULAR | Status: AC
  Filled 2020-11-04: qty 2

## 2020-11-04 MED ORDER — SODIUM CHLORIDE 0.9 % IV SOLN: INTRAVENOUS | Status: DC

## 2020-11-04 MED ORDER — IOHEXOL 300 MG/ML  SOLN
INTRAMUSCULAR | Status: DC | PRN
  Administered 2020-11-04: 125 mL

## 2020-11-04 MED ORDER — SODIUM CHLORIDE 0.9 % IV SOLN
INTRAVENOUS | Status: AC | PRN
  Administered 2020-11-04: 1000 mL via INTRAVENOUS

## 2020-11-04 MED ORDER — LIDOCAINE HCL (PF) 1 % IJ SOLN
INTRAMUSCULAR | Status: DC | PRN
  Administered 2020-11-04: 30 mL

## 2020-11-04 MED ORDER — DEXTROSE 50 % IV SOLN
1.0000 | Freq: Once | INTRAVENOUS | Status: AC
  Administered 2020-11-04: 50 mL via INTRAVENOUS

## 2020-11-04 MED ORDER — HYDROMORPHONE HCL 1 MG/ML IJ SOLN
INTRAMUSCULAR | Status: AC
  Filled 2020-11-04: qty 0.5

## 2020-11-04 MED ORDER — MORPHINE SULFATE (PF) 2 MG/ML IV SOLN
2.0000 mg | Freq: Once | INTRAVENOUS | Status: AC
Start: 2020-11-04 — End: 2020-11-04
  Administered 2020-11-04: 2 mg via INTRAVENOUS

## 2020-11-04 MED ORDER — ONDANSETRON HCL 4 MG/2ML IJ SOLN
4.0000 mg | Freq: Four times a day (QID) | INTRAMUSCULAR | Status: DC | PRN
  Administered 2020-11-04: 4 mg via INTRAVENOUS
  Filled 2020-11-04 (×2): qty 2

## 2020-11-04 MED ORDER — MIDAZOLAM HCL 2 MG/2ML IJ SOLN
INTRAMUSCULAR | Status: DC | PRN
  Administered 2020-11-04: 0.5 mg via INTRAVENOUS

## 2020-11-04 MED ORDER — HYDRALAZINE HCL 20 MG/ML IJ SOLN: 10.0000 mg | INTRAMUSCULAR | Status: AC | PRN

## 2020-11-04 MED ORDER — FENTANYL CITRATE (PF) 100 MCG/2ML IJ SOLN
12.5000 ug | INTRAMUSCULAR | Status: DC | PRN
  Administered 2020-11-04 (×2): 12.5 ug via INTRAVENOUS
  Filled 2020-11-04 (×2): qty 2

## 2020-11-04 MED ORDER — SODIUM CHLORIDE 0.9 % IV SOLN
20.0000 ug | Freq: Once | INTRAVENOUS | Status: AC
  Administered 2020-11-04: 20 ug via INTRAVENOUS
  Filled 2020-11-04: qty 5

## 2020-11-04 MED ORDER — DEXTROSE 50 % IV SOLN
INTRAVENOUS | Status: AC
  Filled 2020-11-04: qty 50

## 2020-11-04 MED ORDER — SODIUM CHLORIDE 0.9 % WEIGHT BASED INFUSION: 1.0000 mL/kg/h | INTRAVENOUS | Status: DC

## 2020-11-04 MED ORDER — LABETALOL HCL 5 MG/ML IV SOLN: 10.0000 mg | INTRAVENOUS | Status: AC | PRN

## 2020-11-04 MED ORDER — NOREPINEPHRINE BITARTRATE 1 MG/ML IV SOLN
INTRAVENOUS | Status: AC
  Filled 2020-11-04: qty 4

## 2020-11-04 MED ORDER — HYDROMORPHONE HCL 1 MG/ML IJ SOLN
0.2500 mg | Freq: Once | INTRAMUSCULAR | Status: AC
  Administered 2020-11-04: 0.25 mg via INTRAVENOUS

## 2020-11-04 MED ORDER — LIDOCAINE 5 % EX PTCH
2.0000 | MEDICATED_PATCH | CUTANEOUS | Status: DC
  Administered 2020-11-05: 2 via TRANSDERMAL
  Filled 2020-11-04: qty 2

## 2020-11-04 MED ORDER — ONDANSETRON HCL 4 MG/2ML IJ SOLN
INTRAMUSCULAR | Status: AC
  Filled 2020-11-04: qty 2

## 2020-11-04 MED ORDER — FENTANYL CITRATE (PF) 100 MCG/2ML IJ SOLN
INTRAMUSCULAR | Status: DC | PRN
  Administered 2020-11-04: 25 ug via INTRAVENOUS

## 2020-11-04 MED ORDER — ACETAMINOPHEN 325 MG PO TABS: 650.0000 mg | ORAL_TABLET | ORAL | Status: DC | PRN

## 2020-11-04 MED ORDER — PIPERACILLIN-TAZOBACTAM IN DEX 2-0.25 GM/50ML IV SOLN
2.2500 g | Freq: Three times a day (TID) | INTRAVENOUS | Status: DC
  Administered 2020-11-04 – 2020-11-05 (×3): 2.25 g via INTRAVENOUS
  Filled 2020-11-04 (×7): qty 50

## 2020-11-04 MED ORDER — SODIUM CHLORIDE 0.9% IV SOLUTION: Freq: Once | INTRAVENOUS | Status: AC

## 2020-11-04 MED ORDER — HYDROMORPHONE HCL 1 MG/ML IJ SOLN
0.2500 mg | Freq: Once | INTRAMUSCULAR | Status: AC
Start: 2020-11-04 — End: 2020-11-04
  Administered 2020-11-04: 0.25 mg via INTRAVENOUS

## 2020-11-04 MED ORDER — HEPARIN (PORCINE) IN NACL 1000-0.9 UT/500ML-% IV SOLN
INTRAVENOUS | Status: AC
  Filled 2020-11-04: qty 1000

## 2020-11-04 MED ORDER — SODIUM CHLORIDE 0.9 % IV SOLN: 250.0000 mL | INTRAVENOUS | Status: DC

## 2020-11-04 MED ORDER — INSULIN ASPART 100 UNIT/ML IJ SOLN
0.0000 [IU] | INTRAMUSCULAR | Status: DC
  Administered 2020-11-05: 1 [IU] via SUBCUTANEOUS

## 2020-11-04 MED ORDER — SODIUM CHLORIDE 0.9 % IV SOLN
25.0000 ug/min | INTRAVENOUS | Status: DC
  Administered 2020-11-04: 125 ug/min via INTRAVENOUS
  Administered 2020-11-04: 100 ug/min via INTRAVENOUS
  Administered 2020-11-05 (×2): 125 ug/min via INTRAVENOUS
  Filled 2020-11-04 (×5): qty 2

## 2020-11-04 MED ORDER — LIDOCAINE HCL (PF) 1 % IJ SOLN
INTRAMUSCULAR | Status: AC
  Filled 2020-11-04: qty 30

## 2020-11-04 MED ORDER — CHLORHEXIDINE GLUCONATE CLOTH 2 % EX PADS
6.0000 | MEDICATED_PAD | Freq: Every day | CUTANEOUS | Status: DC
  Administered 2020-11-04: 6 via TOPICAL

## 2020-11-04 MED ORDER — MIDAZOLAM HCL 2 MG/2ML IJ SOLN
INTRAMUSCULAR | Status: AC
  Filled 2020-11-04: qty 2

## 2020-11-04 SURGICAL SUPPLY — 30 items
CANNULA 5F STIFF (CANNULA) ×1 IMPLANT
CATH BEACON 5 .035 40 KMP TP (CATHETERS) IMPLANT
CATH BEACON 5 .038 40 KMP TP (CATHETERS) ×3
CATH MICROCATH PRGRT 2.8F 110 (CATHETERS) IMPLANT
COIL 400 COMPLEX SOFT 10X35CM (Vascular Products) ×1 IMPLANT
COIL 400 COMPLEX SOFT 16X50CM (Vascular Products) ×1 IMPLANT
COIL 400 COMPLEX SOFT 20X60CM (Vascular Products) ×1 IMPLANT
COIL 400 COMPLEX STD 10X35CM (Vascular Products) ×1 IMPLANT
COIL 400 COMPLEX STD 12X60CM (Vascular Products) ×1 IMPLANT
COIL 400 COMPLEX STD 20X60CM (Vascular Products) ×1 IMPLANT
COIL 400 COMPLEX STD 24X57CM (Vascular Products) ×1 IMPLANT
DEVICE OCCLUSION PODJ15 (Vascular Products) IMPLANT
DEVICE OCCLUSION PODJ60 (Vascular Products) IMPLANT
GLIDEWIRE STIFF .35X180X3 HYDR (WIRE) ×1 IMPLANT
HANDLE DETACHMENT COIL (MISCELLANEOUS) ×1 IMPLANT
KIT MANI 3VAL PERCEP (MISCELLANEOUS) ×1 IMPLANT
KIT MICROPUNCTURE NIT STIFF (SHEATH) ×1 IMPLANT
MICROCATH PROGREAT 2.8F 110 CM (CATHETERS) ×3
NDL PERC 18GX7CM (NEEDLE) IMPLANT
NEEDLE PERC 18GX7CM (NEEDLE) ×3 IMPLANT
OCCLUSION DEVICE PODJ15 (Vascular Products) ×3 IMPLANT
OCCLUSION DEVICE PODJ60 (Vascular Products) ×3 IMPLANT
PACK CARDIAC CATH (CUSTOM PROCEDURE TRAY) ×3 IMPLANT
PROTECTION STATION PRESSURIZED (MISCELLANEOUS) ×3
SET ATX SIMPLICITY (MISCELLANEOUS) ×1 IMPLANT
SHEATH AVANTI 5FR X 11CM (SHEATH) ×1 IMPLANT
SHEATH BRITE TIP 5FRX11 (SHEATH) ×1 IMPLANT
STATION PROTECTION PRESSURIZED (MISCELLANEOUS) IMPLANT
WIRE GUIDERIGHT .035X150 (WIRE) ×1 IMPLANT
WIRE HITORQ VERSACORE ST 145CM (WIRE) ×1 IMPLANT

## 2020-11-04 NOTE — Progress Notes (Signed)
Lactic acid 5.4: informed NP Tonye Royalty

## 2020-11-04 NOTE — Op Note (Signed)
[facility]  Percutaneous Study/Intervention Procedural Note   Date of Surgery: 11/19/2020  Surgeon(s):Dvonte Gatliff    Assistants:none  Pre-operative Diagnosis: retroperitoneal hematoma with attempted cardiac catheterization, hypotension, heparin allergy  Post-operative diagnosis:  Same  Procedure(s) Performed:             1.  Ultrasound guidance for vascular access right femoral artery             2.  Catheter placement into right external iliac artery from right femoral approach             3.  right ileofemoral angiogram             4.  Coil embolization of the right external iliac artery and common femoral artery with a series of Ruby coils             EBL: 5 cc              Indications:  Patient is a 70 y.o.female with a large retroperitoneal hematoma after attempted cardiac catheterization.  I am asked to join the procedure in progress.  The patient has a very complex vascular history has had a previous aorta unit iliac aneurysm repair with left right femoral to femoral bypass.  Attempts of right femoral access have developed a retroperitoneal hematoma and we are asked to join the field to assist in control of hemorrhage.   Procedure:  The patient was identified and appropriate procedural time out was performed.  The patient was then placed supine on the table and prepped and draped in the usual sterile fashion.  Imaging through the existing sheath showed significant extravasation and it was unclear if the sheath was intraluminal at any point.  I then placed several large Ruby coils in this cavity to try to tamponade the bleeding and go back to the access point to control bleeding.  We used a 16 mm long standard coil and then a pair of 20 mm coils before backing down to a 12 mm coil.  Ultrasound was used to evaluate the right common femoral artery below the previous access site at the level of the femoral to femoral bypass.  It was patent .  A digital ultrasound  image was acquired.  A micropuncture needle was used to access the right common femoral artery under direct ultrasound guidance and a permanent image was performed.  A micropuncture wire and sheath were then advanced up into the native right common femoral artery taking care not to go into the femoral to femoral bypass using fluoroscopy.  A 0.035 J wire was advanced without resistance and a 5Fr sheath was placed.  I then used a Kumpe catheter and a prograde catheter and wire got up into the native external iliac artery.  The previously placed sheath was removed and we worked through the new sheath which was intraluminal.  Once we are up the native external iliac artery I used a 12 mm diameter by 60 cm length coil and then a packing coil to coil all the way down to the proximal common femoral artery just below the previous access site.  Following this, there was no further extravasation seen.  It was noticed that there was not flow seen in the femoral to femoral bypass on the last 2 images either, but at this point in a hypotensive patient myocardial infarction and large volume hemorrhage there was very little we can do about that at this point.  tPA and anticoagulation were not  options of trying to get the graft open today would be a dangerous endeavor.  If she remains somewhat ischemic, in 24 to 48 hours we can consider an attempt at revascularization.  I elected to terminate the procedure. The sheath was removed and pressure was held with excellent hemostatic result. The patient was taken to the recovery room.  Findings:                            Right Lower Extremity:  large volume extravasation from right distal external iliac artery and proximal common femoral artery   Disposition: Patient was taken to the recovery room in stable condition having tolerated the procedure well.  Complications: None  Leotis Pain 11/03/2020 11:12 AM   This note was created with Dragon Medical transcription system. Any  errors in dictation are purely unintentional.
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
## 2020-11-04 NOTE — Progress Notes (Signed)
Patient was noted to have retroperitoneal hematoma with attempted cardiac cath followed by hypotension requiring emergent vascular intervention with coil embolization of the right external iliac artery and common femoral artery.  Patient seen and examined in the special recovery area.  Patient was lethargic but was complaining of severe pain in the groin area extending in the back. Patient is severely hypotensive with a blood pressure of 64/40 with a hemoglobin drop from 11.3->7.7.  Patient is critically sick and will need ICU bed and pressors.  Case discussed with Dr. Patsey Berthold who has graciously accepted to take over the service as an attending.  I have discussed with patient's husband about overall very poor prognosis.  Time spent: 20 minutes

## 2020-11-04 NOTE — Progress Notes (Signed)
Dr. Patsey Berthold gave order for q6H sliding scale mild insulin coverage with CBG.

## 2020-11-04 NOTE — Consult Note (Signed)
Pharmacy Antibiotic Note  [name] is a 70 y.o. female with medical history including CAD, ESRD on HD TuThSa, ruptured AAA s/p repair, COPD, head and neck cancer, HTN, hypothyroidism, diastolic HF, GERD admitted on 10/28/2020 with  chest pain . Patient went for cardiac catheterization 8/8 and upon femoral access was found to have large right femoral and/or iliac aneurysm with retroperitoneal bleed. Procedure was aborted and vascular performed coil embolization of right external iliac artery and common femoral artery. Patient subsequently transferred to ICU on vasopressors. New leukocytosis concerning for possible infection / sepsis. Pharmacy has been consulted for Zosyn dosing.  Plan:  Zosyn 2.25 g IV q8h (30-minute infusion)  Continue to follow cultures / clinical status  Height: 5' 4.5" (163.8 cm) Weight: 46.6 kg (102 lb 11.8 oz) IBW/kg (Calculated) : 55.85  Temp (24hrs), Avg:97.5 F (36.4 C), Min:96 F (35.6 C), Max:98.5 F (36.9 C)  Recent Labs  Lab 11/09/2020 1325 11/03/20 0538 10/28/2020 0447 11/22/2020 1333  WBC 7.2 8.4 8.2 23.7*  CREATININE 2.07* 3.71* 5.36*  --     Estimated Creatinine Clearance: 7.1 mL/min (A) (by C-G formula based on SCr of 5.36 mg/dL (H)).    Allergies  Allergen Reactions   Heparin Other (See Comments)    HIT antibody positive     Antimicrobials this admission: Zosyn 8/8 >>   Dose adjustments this admission: N/A  Microbiology results: 8/8 BCx: pending 8/8 MRSA PCR: pending  Thank you for allowing pharmacy to be a part of this patient's care.  Benita Gutter 11/27/2020 3:42 PM

## 2020-11-04 NOTE — Progress Notes (Signed)
[facility] Cardiology [facility] Encounter Note  Patient: [name] / Admit Date: 11/25/2020 / Date of Encounter: 11/08/2020, 8:36 AM   Subjective: Patient has done well overnight with no further evidence of chest discomfort.  Patient has been stable without use of heparin at this time.  Peak troponin 459.  No new EKG changes  Beginning of cardiac catheterization of right femoral access shows large right femoral and/or iliac aneurysm with need in further vascular intervention before further treatment of cardiovascular concerns.  Vascular surgery with Dr. Lucky Cowboy called and further coiling of right femoral aneurysm is performed  Review of Systems: Positive for: Shortness of breath weakness Negative for: Vision change, hearing change, syncope, dizziness, nausea, vomiting,diarrhea, bloody stool, stomach pain, cough, congestion, diaphoresis, urinary frequency, urinary pain,skin lesions, skin rashes Others previously listed  Objective: Telemetry: Normal sinus rhythm Physical Exam: Blood pressure (!) 130/55, pulse 77, temperature 98.5 F (36.9 C), temperature source Oral, resp. rate 19, height 5' 4.5" (1.638 m), weight 46.6 kg, last menstrual period 03/30/1988, SpO2 97 %. Body mass index is 17.36 kg/m. General: Well developed, well nourished, in no acute distress. Head: Normocephalic, atraumatic, sclera non-icteric, no xanthomas, nares are without discharge. Neck: No apparent masses Lungs: Normal respirations with some wheezes, no rhonchi, no rales , no crackles   Heart: Regular rate and rhythm, normal S1 S2, no murmur, no rub, no gallop, PMI is normal size and placement, carotid upstroke normal without bruit, jugular venous pressure normal Abdomen: Soft, non-tender, non-distended with normoactive bowel sounds. No hepatosplenomegaly. Abdominal aorta is normal size without bruit Extremities: No edema, no clubbing, no cyanosis, no ulcers,  Peripheral: 2+ radial, 2+ femoral, 2+ dorsal pedal  pulses Neuro: Alert and oriented. Moves all extremities spontaneously. Psych:  Responds to questions appropriately with a normal affect.   Intake/Output Summary (Last 24 hours) at 11/19/2020 0836 Last data filed at 11/03/2020 0908 Gross per 24 hour  Intake --  Output 1 ml  Net -1 ml    Inpatient Medications:   [MAR Hold] aspirin  325 mg Oral Daily   [MAR Hold] atorvastatin  40 mg Oral Daily   [MAR Hold] clopidogrel  75 mg Oral Daily   [MAR Hold] levothyroxine  100 mcg Oral QAC breakfast   [MAR Hold] metoprolol tartrate  25 mg Oral q1800   [MAR Hold] multivitamin  1 tablet Oral QHS   [MAR Hold] nitroGLYCERIN  0.5 inch Topical Q6H   [MAR Hold] pantoprazole  40 mg Oral QHS   [MAR Hold] sodium chloride flush  3 mL Intravenous Q12H   Infusions:   sodium chloride     sodium chloride 999 mL/hr (11/09/2020 0832)   sodium chloride      Labs: Recent Labs    11/03/20 0538 11/13/2020 0447  NA 136 136  K 3.5 3.9  CL 95* 94*  CO2 28 24  GLUCOSE 76 60*  BUN 14 23  CREATININE 3.71* 5.36*  CALCIUM 9.3 9.2   No results for input(s): AST, ALT, ALKPHOS, BILITOT, PROT, ALBUMIN in the last 72 hours. Recent Labs    11/03/20 0538 11/18/2020 0447  WBC 8.4 8.2  HGB 11.1* 11.3*  HCT 30.8* 31.9*  MCV 109.2* 107.4*  PLT 138* 142*   No results for input(s): CKTOTAL, CKMB, TROPONINI in the last 72 hours. Invalid input(s): POCBNP No results for input(s): HGBA1C in the last 72 hours.   Weights: Filed Weights   11/13/2020 1323 11/03/20 2220  Weight: 49 kg 46.6 kg  Radiology/Studies:  DG Chest 2 View  Result Date: 11/10/2020 CLINICAL DATA:  70 year old female with chest pain. EXAM: CHEST - 2 VIEW COMPARISON:  12/21/2016 FINDINGS: The mediastinal contours are within normal limits. No cardiomegaly. Atherosclerotic calcification of the aortic arch. Similar appearing emphysematous changes. The lungs are clear bilaterally without evidence of focal consolidation, pleural effusion, or pneumothorax.  Left upper extremity venous stents in place. Partially visualized abdominal aortic EVAR stent graft. Partially visualized anterior wedge compression deformity of a lower thoracic vertebral, unchanged no acute osseous abnormality. IMPRESSION: No acute cardiopulmonary process. Aortic Atherosclerosis (ICD10-I70.0) and Emphysema (ICD10-J43.9). Electronically Signed   By: Ruthann Cancer MD   On: 11/17/2020 14:09     Assessment and Recommendation  70 y.o. female with known coronary artery disease hypertension hyperlipidemia chronic kidney disease stage V on dialysis and abdominal aortic aneurysm repair with femorofemoral bypass and large right femoral artery aneurysm needing further vascular treatment including coiling and other interventional 1.  Further interventional coiling of right femoral artery abnormality and aneurysm as per vascular 2.  Continue medication management for chest discomfort including nitrates beta-blocker and calcium channel blocker 3.  Defer cardiac catheterization at this time until patient fully recovered from peripheral vascular intervention 4.  Further treatment options after above  Signed, Serafina Royals M.D. FACC

## 2020-11-04 NOTE — Progress Notes (Signed)
Dr. Patsey Berthold gave order for normal saline at 125cc/H.

## 2020-11-04 NOTE — Progress Notes (Signed)
Pt arrived to holding area room 1 with severe pain in R pelvis area. Pt asking for RX waiting on Md w plan of care.  Pt receiving fluid bolus for SBP of 80 mmhg. R fem site is CDI Pt distal pulses documented. Md is aware

## 2020-11-04 NOTE — Progress Notes (Signed)
Dewaine Conger, NP gave order for goal MAP > 60 for NEO drip.

## 2020-11-04 NOTE — Progress Notes (Signed)
[facility] Cardiology [facility] Encounter Note  Patient: [name] / Admit Date: 11/11/2020 / Date of Encounter: 11/13/2020, 11:01 AM   Subjective: 9 am 8/8 Patient has done well overnight with no further evidence of chest discomfort.  Patient has been stable without use of heparin at this time.  Peak troponin 459.  No new EKG changes  Beginning of cardiac catheterization of right femoral access shows large right femoral and/or iliac aneurysm with need in further vascular intervention before further treatment of cardiovascular concerns.  Vascular surgery with Dr. Lucky Cowboy called and further coiling of right femoral aneurysm is performed  11am 8/8 patient has stable bp now around 84ON systolic and ano evidence of respiratory distress Perpheral pulses stable and no evidence of sichemia Patient has buttock pain as expected but trying to help   Review of Systems: Positive for: right buttock pain Negative for: Vision change, hearing change, syncope, dizziness, nausea, vomiting,diarrhea, bloody stool, stomach pain, cough, congestion, diaphoresis, urinary frequency, urinary pain,skin lesions, skin rashes Others previously listed  Objective: Telemetry: Normal sinus rhythm Physical Exam: Blood pressure (!) 77/36, pulse 60, temperature 98.5 F (36.9 C), temperature source Oral, resp. rate 13, height 5' 4.5" (1.638 m), weight 46.6 kg, last menstrual period 03/30/1988, SpO2 95 %. Body mass index is 17.36 kg/m. General: Well developed, well nourished, in no acute distress. Head: Normocephalic, atraumatic, sclera non-icteric, no xanthomas, nares are without discharge. Neck: No apparent masses Lungs: Normal respirations with some wheezes, no rhonchi, no rales , no crackles   Heart: Regular rate and rhythm, normal S1 S2, no murmur, no rub, no gallop, PMI is normal size and placement, carotid upstroke normal without bruit, jugular venous pressure normal Abdomen: Soft, non-tender, non-distended with  normoactive bowel sounds. No hepatosplenomegaly. Abdominal aorta is normal size without bruit Extremities: No edema, no clubbing, no cyanosis, no ulcers,  Peripheral: 2+ radial, 2+ femoral, 1+ dorsal pedal pulses Neuro: Alert and oriented. Moves all extremities spontaneously. Psych:  Responds to questions appropriately with a normal affect.  No intake or output data in the 24 hours ending 11/07/2020 1101   Inpatient Medications:   [MAR Hold] aspirin  325 mg Oral Daily   [MAR Hold] atorvastatin  40 mg Oral Daily   [MAR Hold] clopidogrel  75 mg Oral Daily   [MAR Hold] levothyroxine  100 mcg Oral QAC breakfast   [MAR Hold] metoprolol tartrate  25 mg Oral q1800   morphine       [MAR Hold] multivitamin  1 tablet Oral QHS   [MAR Hold] nitroGLYCERIN  0.5 inch Topical Q6H   ondansetron       [MAR Hold] pantoprazole  40 mg Oral QHS   [MAR Hold] sodium chloride flush  3 mL Intravenous Q12H   Infusions:   sodium chloride     sodium chloride     sodium chloride      Labs: Recent Labs    11/03/20 0538 11/27/2020 0447  NA 136 136  K 3.5 3.9  CL 95* 94*  CO2 28 24  GLUCOSE 76 60*  BUN 14 23  CREATININE 3.71* 5.36*  CALCIUM 9.3 9.2    No results for input(s): AST, ALT, ALKPHOS, BILITOT, PROT, ALBUMIN in the last 72 hours. Recent Labs    11/03/20 0538 11/15/2020 0447  WBC 8.4 8.2  HGB 11.1* 11.3*  HCT 30.8* 31.9*  MCV 109.2* 107.4*  PLT 138* 142*    No results for input(s): CKTOTAL, CKMB, TROPONINI in the last 72 hours. Invalid  input(s): POCBNP No results for input(s): HGBA1C in the last 72 hours.   Weights: Filed Weights   11/18/2020 1323 11/03/20 2220  Weight: 49 kg 46.6 kg     Radiology/Studies:  DG Chest 2 View  Result Date: 11/14/2020 CLINICAL DATA:  70 year old female with chest pain. EXAM: CHEST - 2 VIEW COMPARISON:  12/21/2016 FINDINGS: The mediastinal contours are within normal limits. No cardiomegaly. Atherosclerotic calcification of the aortic arch. Similar  appearing emphysematous changes. The lungs are clear bilaterally without evidence of focal consolidation, pleural effusion, or pneumothorax. Left upper extremity venous stents in place. Partially visualized abdominal aortic EVAR stent graft. Partially visualized anterior wedge compression deformity of a lower thoracic vertebral, unchanged no acute osseous abnormality. IMPRESSION: No acute cardiopulmonary process. Aortic Atherosclerosis (ICD10-I70.0) and Emphysema (ICD10-J43.9). Electronically Signed   By: Ruthann Cancer MD   On: 11/11/2020 14:09   CARDIAC CATHETERIZATION  Result Date: 10/28/2020 See surgical note for result.  PERIPHERAL VASCULAR CATHETERIZATION  Result Date: 11/12/2020 See surgical note for result.    Assessment and Recommendation  70 y.o. female with known coronary artery disease hypertension hyperlipidemia chronic kidney disease stage V on dialysis and abdominal aortic aneurysm repair with femorofemoral bypass and large right femoral artery aneurysm needing further vascular treatment including coiling and other interventional procedure and stable at this but with pain 1.  No further cardiovascular intervention at this time due to stablility 2.  Continue medication management for chest discomfort including nitrates beta-blocker and calcium channel blocker when patinet has better pressures 3.  Defer cardiac catheterization at this time until patient fully recovered from peripheral vascular intervention 4.  Pain meds with dilaudid if able 5. Cxr  6. Cbc 7. Continue supportive care as above  Signed, Serafina Royals M.D. FACC

## 2020-11-04 NOTE — Progress Notes (Addendum)
Made Dr. Patsey Berthold and Dewaine Conger, NP aware that IV team was unable to place PIV and attempted with ultrasound. Also made MD aware that patient is complaining of back and chest pain and that fentanyl 12.5 mcg has been given with little relief. MD acknowledged, no new orders given at this time except adjusting dose range for PRN fentanyl.

## 2020-11-04 NOTE — Progress Notes (Signed)
[facility]  ROUNDING NOTE   Subjective:   [name] is a 70 y.o. female with past medical history of CAD, ruptured AAA with repair, COPD, recently diagnosed head and neck cancer, hypertension, hypothyroidism and ESRD on HD. She reports to the ED with complaints of chest pain for the past week. She is being admitted for NSTEMI (non-ST elevated myocardial infarction) ([place]) [I21.4] Angina at rest [facility]) [I20.8]  Patient is known to this practice from previous admission and currently receives dialysis at [facility], monitored by [facility]. We have been consulted to manage dialysis needs during this admission.   Patient seen after she returned from the Cath Lab.  She was diagnosed with a retroperitoneal hematoma during attempted cardiac catheterization, hypotension.  Angiogram performed by Dr. Lucky Cowboy with coil embolization of the right external iliac artery and common femoral artery Patient seen in the ICU.  Currently getting blood transfusion.  Also getting fluid bolus for hypotension.  Patient is alert and able to follow simple commands.  Mouth appears dry.   Objective:  Vital signs in last 24 hours:  Temp:  [96 F (35.6 C)-98.5 F (36.9 C)] 96.1 F (35.6 C) (08/08 1542) Pulse Rate:  [58-98] 89 (08/08 1542) Resp:  [12-19] 15 (08/08 1715) BP: (64-148)/(24-61) 105/53 (08/08 1715) SpO2:  [91 %-100 %] 94 % (08/08 1542) Weight:  [46.6 kg] 46.6 kg (08/07 2220)  Weight change: -2.388 kg Filed Weights   11/26/2020 1323 11/03/20 2220  Weight: 49 kg 46.6 kg    Intake/Output: I/O last 3 completed shifts: In: -  Out: 1 [Stool:1]   Intake/Output this shift:  No intake/output data recorded.  Physical Exam: General: NAD, resting in bed  Head: Normocephalic, atraumatic.  Dry oral mucosal membranes  Eyes: Anicteric  Lungs:  Minimal scattered rhonchi, normal effort,  Heart: Regular rate and rhythm  Abdomen:  Soft, nontender  Extremities:  no peripheral edema.  Neurologic:  Nonfocal, moving all four extremities  Skin: No lesions  Access: Lt AVG-good bruit    Basic Metabolic Panel: Recent Labs  Lab 10/28/2020 1325 11/03/20 0538 11/15/2020 0447  NA 136 136 136  K 3.3* 3.5 3.9  CL 96* 95* 94*  CO2 28 28 24   GLUCOSE 121* 76 60*  BUN 7* 14 23  CREATININE 2.07* 3.71* 5.36*  CALCIUM 8.6* 9.3 9.2     Liver Function Tests: No results for input(s): AST, ALT, ALKPHOS, BILITOT, PROT, ALBUMIN in the last 168 hours. No results for input(s): LIPASE, AMYLASE in the last 168 hours. No results for input(s): AMMONIA in the last 168 hours.  CBC: Recent Labs  Lab 11/07/2020 1325 11/03/20 0538 11/23/2020 0447 10/30/2020 1333  WBC 7.2 8.4 8.2 23.7*  NEUTROABS  --   --   --  21.1*  HGB 11.7* 11.1* 11.3* 7.7*  HCT 33.0* 30.8* 31.9* 22.8*  MCV 109.6* 109.2* 107.4* 118.1*  PLT 156 138* 142* 255     Cardiac Enzymes: No results for input(s): CKTOTAL, CKMB, CKMBINDEX, TROPONINI in the last 168 hours.  BNP: Invalid input(s): POCBNP  CBG: Recent Labs  Lab 11/16/2020 1316 11/25/2020 1411 11/25/2020 1625  GLUCAP 66* 109* 105*    Microbiology: Results for orders placed or performed during the hospital encounter of 11/15/2020  SARS CORONAVIRUS 2 (TAT 6-24 HRS) Nasopharyngeal Nasopharyngeal Swab     Status: None   Collection Time: 11/01/2020  9:45 PM   Specimen: Nasopharyngeal Swab  Result Value Ref Range Status   SARS Coronavirus 2 NEGATIVE NEGATIVE  Final    Comment: (NOTE) SARS-CoV-2 target nucleic acids are NOT DETECTED.  The SARS-CoV-2 RNA is generally detectable in upper and lower respiratory specimens during the acute phase of infection. Negative results do not preclude SARS-CoV-2 infection, do not rule out co-infections with other pathogens, and should not be used as the sole basis for treatment or other patient management decisions. Negative results must be combined with clinical observations, patient history, and epidemiological information. The  expected result is Negative.  Fact Sheet for Patients: SugarRoll.be  Fact Sheet for Healthcare Providers: https://www.woods-mathews.com/  This test is not yet approved or cleared by the [place] FDA and  has been authorized for detection and/or diagnosis of SARS-CoV-2 by FDA under an Emergency Use Authorization (EUA). This EUA will remain  in effect (meaning this test can be used) for the duration of the COVID-19 declaration under Se ction 564(b)(1) of the Act, 21 U.S.C. section 360bbb-3(b)(1), unless the authorization is terminated or revoked sooner.  Performed at [facility], [place] [address], [place], [place] [address]   MRSA Next Gen by PCR, Nasal     Status: None   Collection Time: 11/11/2020  2:34 PM   Specimen: Nasal Mucosa; Nasal Swab  Result Value Ref Range Status   MRSA by PCR Next Gen NOT DETECTED NOT DETECTED Final    Comment: (NOTE) The GeneXpert MRSA Assay (FDA approved for NASAL specimens only), is one component of a comprehensive MRSA colonization surveillance program. It is not intended to diagnose MRSA infection nor to guide or monitor treatment for MRSA infections. Test performance is not FDA approved in patients less than 96 years old. Performed at [facility], [address] [place], [place] [address]     Coagulation Studies: Recent Labs    11/19/2020 1325  LABPROT 13.5  INR 1.0     Urinalysis: No results for input(s): COLORURINE, LABSPEC, PHURINE, GLUCOSEU, HGBUR, BILIRUBINUR, KETONESUR, PROTEINUR, UROBILINOGEN, NITRITE, LEUKOCYTESUR in the last 72 hours.  Invalid input(s): APPERANCEUR    Imaging: CARDIAC CATHETERIZATION  Result Date: 11/06/2020 See surgical note for result.  PERIPHERAL VASCULAR CATHETERIZATION  Result Date: 11/25/2020 See surgical note for result.  DG Chest Port 1 View  Result Date: 11/08/2020 CLINICAL DATA:  Angina at rest. Chest pain. Status post  catheterization for non ST segment elevation MI. EXAM: PORTABLE CHEST 1 VIEW COMPARISON:  11/08/2020 FINDINGS: Normal heart size. No pleural effusion identified. Mild diffuse interlobular septal thickening is identified compatible with pulmonary edema. No airspace opacification. Left upper extremity venous stents noted. IMPRESSION: Mild pulmonary edema. Electronically Signed   By: Kerby Moors M.D.   On: 11/21/2020 14:35     Medications:    sodium chloride 125 mL/hr at 11/04/20 1445   sodium chloride     desmopressin (DDAVP) IV for Bleeding     phenylephrine (NEO-SYNEPHRINE) Adult infusion     piperacillin-tazobactam (ZOSYN)  IV      atorvastatin  40 mg Oral Daily   Chlorhexidine Gluconate Cloth  6 each Topical Daily   dextrose       HYDROmorphone       levothyroxine  100 mcg Oral QAC breakfast   morphine       multivitamin  1 tablet Oral QHS   ondansetron       pantoprazole  40 mg Oral QHS   sodium chloride flush  3 mL Intravenous Q12H   acetaminophen, cyclobenzaprine, docusate sodium, fentaNYL (SUBLIMAZE) injection, gabapentin, lidocaine-prilocaine, nitroGLYCERIN, ondansetron (ZOFRAN) IV  Assessment/ Plan:  Ms. LAVENIA STUMPO is a 70 y.o.  female with past medical history of CAD, ruptured AAA with repair, COPD, recently diagnosed head and neck cancer, hypertension, hypothyroidism and ESRD on HD. She reports to the ED with complaints of chest pain for the past week. She is being admitted for NSTEMI (non-ST elevated myocardial infarction) ([place]) [I21.4] Angina at rest [facility]) [I20.8]   [facility] [address]/TTS/Lt AVG  End stage renal disease on dialysis Will maintain outpatient schedule if possible Next treatment planned for Tuesday but will assess for hemodynamics stability in the morning prior to initiating dialysis  2. Anemia of chronic kidney disease  and blood loss Lab Results  Component Value Date   HGB 7.7 (L) 11/21/2020  Will initiate Epogen with hemodialysis Getting  blood transfusion on 8/8 after significant drop in hemoglobin was noted from 11.3-7.7  3. Secondary Hyperparathyroidism:  Lab Results  Component Value Date   PTH 31 06/18/2015   CALCIUM 9.2 11/11/2020   CAION 1.20 03/21/2019   PHOS 3.8 10/21/2019   Calcium at goal Will check phosphorus with morning labs  4. Chest pain, NSTEMI Cardiac cath could not be completed due to right femoral aneurysm.  Status post coiling on 11/27/2020 by Dr Lucky Cowboy Cardiology following    LOS: 2 Kailen Name Candiss Norse 8/8/20225:30 PM

## 2020-11-05 ENCOUNTER — Inpatient Hospital Stay
Admit: 2020-11-05 | Discharge: 2020-11-05 | Disposition: A | Discharge status: Home / Self Care | Payer: Medicare Other | Attending: Internal Medicine | Admitting: Internal Medicine

## 2020-11-05 ENCOUNTER — Inpatient Hospital Stay: Payer: Medicare Other

## 2020-11-05 DIAGNOSIS — I208 Other forms of angina pectoris: Secondary | ICD-10-CM | POA: Diagnosis not present

## 2020-11-05 DIAGNOSIS — M79605 Pain in left leg: Secondary | ICD-10-CM

## 2020-11-05 DIAGNOSIS — M79604 Pain in right leg: Secondary | ICD-10-CM

## 2020-11-05 DIAGNOSIS — I214 Non-ST elevation (NSTEMI) myocardial infarction: Secondary | ICD-10-CM | POA: Diagnosis not present

## 2020-11-05 LAB — CBC
HCT: 31.6 % — ABNORMAL LOW (ref 36.0–46.0)
HCT: 27.9 % — ABNORMAL LOW (ref 36.0–46.0)
Hemoglobin: 10.6 g/dL — ABNORMAL LOW (ref 12.0–15.0)
Hemoglobin: 9.5 g/dL — ABNORMAL LOW (ref 12.0–15.0)
MCH: 36.9 pg — ABNORMAL HIGH (ref 26.0–34.0)
MCH: 37.3 pg — ABNORMAL HIGH (ref 26.0–34.0)
MCHC: 33.5 g/dL (ref 30.0–36.0)
MCHC: 34.1 g/dL (ref 30.0–36.0)
MCV: 110.1 fL — ABNORMAL HIGH (ref 80.0–100.0)
MCV: 109.4 fL — ABNORMAL HIGH (ref 80.0–100.0)
Platelets: 277 10*3/uL (ref 150–400)
Platelets: 169 10*3/uL (ref 150–400)
RBC: 2.87 MIL/uL — ABNORMAL LOW (ref 3.87–5.11)
RBC: 2.55 MIL/uL — ABNORMAL LOW (ref 3.87–5.11)
RDW: 20.8 % — ABNORMAL HIGH (ref 11.5–15.5)
RDW: 20.7 % — ABNORMAL HIGH (ref 11.5–15.5)
WBC: 26.5 10*3/uL — ABNORMAL HIGH (ref 4.0–10.5)
WBC: 25.9 10*3/uL — ABNORMAL HIGH (ref 4.0–10.5)
nRBC: 0.2 % (ref 0.0–0.2)
nRBC: 0.5 % — ABNORMAL HIGH (ref 0.0–0.2)

## 2020-11-05 LAB — TYPE AND SCREEN
ABO/RH(D): B POS
Antibody Screen: NEGATIVE
Unit division: 0

## 2020-11-05 LAB — BLOOD GAS, ARTERIAL
Acid-base deficit: 21 mmol/L — ABNORMAL HIGH (ref 0.0–2.0)
Bicarbonate: 9.7 mmol/L — ABNORMAL LOW (ref 20.0–28.0)
FIO2: 1
MECHVT: 400 mL
Mechanical Rate: 16
O2 Saturation: 85.3 %
PEEP: 8 cmH2O
Patient temperature: 37
RATE: 16 resp/min
pCO2 arterial: 42 mmHg (ref 32.0–48.0)
pH, Arterial: 6.97 — CL (ref 7.350–7.450)
pO2, Arterial: 79 mmHg — ABNORMAL LOW (ref 83.0–108.0)

## 2020-11-05 LAB — RENAL FUNCTION PANEL
Albumin: 2.7 g/dL — ABNORMAL LOW (ref 3.5–5.0)
Anion gap: 20 — ABNORMAL HIGH (ref 5–15)
BUN: 24 mg/dL — ABNORMAL HIGH (ref 8–23)
CO2: 11 mmol/L — ABNORMAL LOW (ref 22–32)
Calcium: 7.9 mg/dL — ABNORMAL LOW (ref 8.9–10.3)
Chloride: 106 mmol/L (ref 98–111)
Creatinine, Ser: 5.27 mg/dL — ABNORMAL HIGH (ref 0.44–1.00)
GFR, Estimated: 8 mL/min — ABNORMAL LOW (ref >60)
Glucose, Bld: 61 mg/dL — ABNORMAL LOW (ref 70–99)
Phosphorus: 10.3 mg/dL — ABNORMAL HIGH (ref 2.5–4.6)
Potassium: 4.8 mmol/L (ref 3.5–5.1)
Sodium: 137 mmol/L (ref 135–145)

## 2020-11-05 LAB — LACTIC ACID, PLASMA: Lactic Acid, Venous: >11 mmol/L (ref 0.5–1.9)

## 2020-11-05 LAB — ECHOCARDIOGRAM COMPLETE
AR max vel: 1.21 cm2
AV Area VTI: 1.24 cm2
AV Area mean vel: 1.27 cm2
AV Mean grad: 8 mmHg
AV Peak grad: 16.6 mmHg
Ao pk vel: 2.04 m/s
Area-P 1/2: 4.85 cm2
Height: 64.5 in
MV VTI: 1.71 cm2
P 1/2 time: 172 msec
S' Lateral: 2.9 cm
Weight: 1643.75 oz

## 2020-11-05 LAB — BASIC METABOLIC PANEL
Anion gap: 26 — ABNORMAL HIGH (ref 5–15)
BUN: 27 mg/dL — ABNORMAL HIGH (ref 8–23)
CO2: 12 mmol/L — ABNORMAL LOW (ref 22–32)
Calcium: 10.1 mg/dL (ref 8.9–10.3)
Chloride: 103 mmol/L (ref 98–111)
Creatinine, Ser: 5.27 mg/dL — ABNORMAL HIGH (ref 0.44–1.00)
GFR, Estimated: 8 mL/min — ABNORMAL LOW (ref >60)
Glucose, Bld: 213 mg/dL — ABNORMAL HIGH (ref 70–99)
Potassium: 5.6 mmol/L — ABNORMAL HIGH (ref 3.5–5.1)
Sodium: 141 mmol/L (ref 135–145)

## 2020-11-05 LAB — BPAM RBC
Blood Product Expiration Date: 202208202359
ISSUE DATE / TIME: 202208081515
Unit Type and Rh: 7300

## 2020-11-05 LAB — PROCALCITONIN: Procalcitonin: 2.27 ng/mL

## 2020-11-05 LAB — HEMOGLOBIN AND HEMATOCRIT, BLOOD
HCT: 28.4 % — ABNORMAL LOW (ref 36.0–46.0)
Hemoglobin: 9.7 g/dL — ABNORMAL LOW (ref 12.0–15.0)

## 2020-11-05 LAB — GLUCOSE, CAPILLARY
Glucose-Capillary: 150 mg/dL — ABNORMAL HIGH (ref 70–99)
Glucose-Capillary: 46 mg/dL — ABNORMAL LOW (ref 70–99)
Glucose-Capillary: 153 mg/dL — ABNORMAL HIGH (ref 70–99)
Glucose-Capillary: 116 mg/dL — ABNORMAL HIGH (ref 70–99)

## 2020-11-05 MED ORDER — MIDAZOLAM HCL 2 MG/2ML IJ SOLN: 2.0000 mg | INTRAMUSCULAR | Status: DC | PRN

## 2020-11-05 MED ORDER — STERILE WATER FOR INJECTION IV SOLN
INTRAVENOUS | Status: DC
  Filled 2020-11-05: qty 1000
  Filled 2020-11-05: qty 150
  Filled 2020-11-05: qty 1000

## 2020-11-05 MED ORDER — ACETAMINOPHEN 650 MG RE SUPP: 650.0000 mg | Freq: Four times a day (QID) | RECTAL | Status: DC | PRN

## 2020-11-05 MED ORDER — FENTANYL 2500MCG IN NS 250ML (10MCG/ML) PREMIX INFUSION
0.0000 ug/h | INTRAVENOUS | Status: DC
  Administered 2020-11-05: 50 ug/h via INTRAVENOUS
  Filled 2020-11-05: qty 250

## 2020-11-05 MED ORDER — NOREPINEPHRINE 4 MG/250ML-% IV SOLN
INTRAVENOUS | Status: AC
  Administered 2020-11-05: 4 mg
  Filled 2020-11-05: qty 250

## 2020-11-05 MED ORDER — SODIUM BICARBONATE 8.4 % IV SOLN
50.0000 meq | Freq: Once | INTRAVENOUS | Status: AC
  Administered 2020-11-05: 50 meq via INTRAVENOUS

## 2020-11-05 MED ORDER — MORPHINE SULFATE (PF) 2 MG/ML IV SOLN
2.0000 mg | Freq: Once | INTRAVENOUS | Status: AC
Start: 2020-11-05 — End: 2020-11-05
  Administered 2020-11-05: 2 mg via INTRAVENOUS
  Filled 2020-11-05: qty 1

## 2020-11-05 MED ORDER — NOREPINEPHRINE 4 MG/250ML-% IV SOLN
0.0000 ug/min | INTRAVENOUS | Status: DC
  Administered 2020-11-05: 60 ug/min via INTRAVENOUS
  Administered 2020-11-05: 70 ug/min via INTRAVENOUS
  Administered 2020-11-05: 40 ug/min via INTRAVENOUS
  Administered 2020-11-05: 70 ug/min via INTRAVENOUS
  Administered 2020-11-05: 25 ug/min via INTRAVENOUS
  Filled 2020-11-05 (×5): qty 250

## 2020-11-05 MED ORDER — CHLORHEXIDINE GLUCONATE 0.12% ORAL RINSE (MEDLINE KIT): 15.0000 mL | Freq: Two times a day (BID) | OROMUCOSAL | Status: DC

## 2020-11-05 MED ORDER — PHENYLEPHRINE HCL-NACL 20-0.9 MG/250ML-% IV SOLN
25.0000 ug/min | INTRAVENOUS | Status: DC
  Administered 2020-11-05 (×4): 200 ug/min via INTRAVENOUS
  Filled 2020-11-05 (×5): qty 250

## 2020-11-05 MED ORDER — DEXTROSE 50 % IV SOLN
INTRAVENOUS | Status: AC
  Administered 2020-11-05: 50 mL
  Filled 2020-11-05: qty 50

## 2020-11-05 MED ORDER — DIPHENHYDRAMINE HCL 50 MG/ML IJ SOLN: 25.0000 mg | INTRAMUSCULAR | Status: DC | PRN

## 2020-11-05 MED ORDER — PANTOPRAZOLE SODIUM 40 MG IV SOLR: 40.0000 mg | Freq: Every day | INTRAVENOUS | Status: DC

## 2020-11-05 MED ORDER — MORPHINE BOLUS VIA INFUSION
5.0000 mg | INTRAVENOUS | Status: DC | PRN
Start: 2020-11-05 — End: 2020-11-05
  Filled 2020-11-05: qty 5

## 2020-11-05 MED ORDER — MORPHINE SULFATE (PF) 2 MG/ML IV SOLN: 2.0000 mg | INTRAVENOUS | Status: DC | PRN

## 2020-11-05 MED ORDER — SODIUM BICARBONATE 8.4 % IV SOLN
50.0000 meq | Freq: Once | INTRAVENOUS | Status: AC
  Administered 2020-11-05: 50 meq via INTRAVENOUS
  Filled 2020-11-05: qty 100

## 2020-11-05 MED ORDER — DEXTROSE 5 % IV SOLN: INTRAVENOUS | Status: DC

## 2020-11-05 MED ORDER — POLYVINYL ALCOHOL 1.4 % OP SOLN
1.0000 [drp] | Freq: Four times a day (QID) | OPHTHALMIC | Status: DC | PRN
  Filled 2020-11-05: qty 15

## 2020-11-05 MED ORDER — ORAL CARE MOUTH RINSE
15.0000 mL | OROMUCOSAL | Status: DC
  Administered 2020-11-05 (×3): 15 mL via OROMUCOSAL

## 2020-11-05 MED ORDER — GLYCOPYRROLATE 1 MG PO TABS
1.0000 mg | ORAL_TABLET | ORAL | Status: DC | PRN
  Filled 2020-11-05: qty 1

## 2020-11-05 MED ORDER — GLYCOPYRROLATE 0.2 MG/ML IJ SOLN: 0.2000 mg | INTRAMUSCULAR | Status: DC | PRN

## 2020-11-05 MED ORDER — ACETAMINOPHEN 325 MG PO TABS: 650.0000 mg | ORAL_TABLET | Freq: Four times a day (QID) | ORAL | Status: DC | PRN

## 2020-11-05 MED ORDER — MORPHINE 100MG IN NS 100ML (1MG/ML) PREMIX INFUSION
0.0000 mg/h | INTRAVENOUS | Status: DC
  Administered 2020-11-05: 5 mg/h via INTRAVENOUS
  Filled 2020-11-05: qty 100

## 2020-11-05 MED FILL — Medication: Qty: 1 | Status: AC

## 2020-11-06 LAB — PREPARE RBC (CROSSMATCH)

## 2020-11-09 LAB — CULTURE, BLOOD (ROUTINE X 2)
Culture: NO GROWTH
Culture: NO GROWTH

## 2020-11-28 NOTE — Progress Notes (Signed)
Husband and sisters have made decision to transition patient to comfort care. Pt extubated to room are after being started on morhpine gtt with family at bedside

## 2020-11-28 NOTE — Progress Notes (Signed)
Had spoken with [facility] examiner.  Reviewed patients course of treatment with [facility]. Declined as a Medical examiner case.

## 2020-11-28 NOTE — Progress Notes (Signed)
CODE NOTE:  Called to bedside emergently that patient was becoming bradycardic and they needed me to assess. Immediately went to bedside and upon arrival, patient was in asystole on the monitor and unresponsive. CODE blue was initiated and CPR immediately started. Bag mask ventilation immediately followed. Patient received 1Epi immediately.  ED physician arrived shortly and patient was successfully intubated. Patient required an additional 3-4 rounds of CPR post-intubation until ROSC achieved in addition to more Epi, Bicarb and Calcium. Patient was PEA with each rhythm and never required a shock.   Post ROSC, Levophed gtt was initiated. Repeat labs sent: K 5.6, Hgb 9.5. ABG: 6.9/40/78/7.7. 2amps of bicarb ordered and Bicarb gtt initiated. Arterial line placed due to poor palpable pulses and labile pulses. Fentanyl gtt for sedation as patient was waking up and mouthing words.   I did call the husband who came to bedside. I was able to discuss code status with the husband who was in agreement with no further CPR if the patient were to cardiac arrest again.   See CODE sheet for further details in regards to code event.    Dr.Gonzalez was notified of event.     Tonye Royalty ACNP-BC
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
## 2020-11-28 NOTE — Progress Notes (Signed)
[facility] Daily Progress Note  11/19/2020:             1.  Ultrasound guidance for vascular access right femoral artery             2.  Catheter placement into right external iliac artery from right femoral approach             3.  right ileofemoral angiogram             4.  Coil embolization of the right external iliac artery and common femoral artery with a series of Ruby coils  Subjective: Intubated and sedated. Code Blue last night. Family at bedside.   Objective: Vitals:   2020/11/06 1100 2020-11-06 1130 11/06/2020 1200 2020-11-06 1230  BP: 101/65 114/68 (!) 116/52 112/66  Pulse:      Resp: (!) 23 15 15 13   Temp:   98.1 F (36.7 C)   TempSrc:   Axillary   SpO2:      Weight:      Height:        Intake/Output Summary (Last 24 hours) at 2020/11/06 1331 Last data filed at 11-06-20 4010 Gross per 24 hour  Intake 3637.34 ml  Output 1 ml  Net 3636.34 ml   Physical Exam: Sedated, NAD CV: RRR Pulmonary: CTA Bilaterally Abdomen: Soft, Nontender, Nondistended Vascular:   Bilateral Lower Extremities: warm becoming cooler distally. Hard to palpate pedal pulses.    Laboratory: CBC    Component Value Date/Time   WBC 25.9 (H) November 06, 2020 0335   HGB 9.7 (L) 2020-11-06 1012   HGB 10.8 (L) 07/19/2014 0841   HCT 28.4 (L) Nov 06, 2020 1012   HCT 33.7 (L) 07/19/2014 0841   PLT 169 11-06-20 0335   PLT 237 07/19/2014 0841   BMET    Component Value Date/Time   NA 141 11-06-20 0335   NA 135 07/22/2014 0645   K 5.6 (H) Nov 06, 2020 0335   K 3.7 07/22/2014 0645   CL 103 2020-11-06 0335   CL 112 (H) 07/22/2014 0645   CO2 12 (L) 11/06/20 0335   CO2 18 (L) 07/22/2014 0645   GLUCOSE 213 (H) 11-06-20 0335   GLUCOSE 77 07/22/2014 0645   BUN 27 (H) 06-Nov-2020 0335   BUN 24 (H) 07/22/2014 0645   CREATININE 5.27 (H) 11-06-2020 0335   CREATININE 1.00 07/22/2014 0645   CALCIUM 10.1 06-Nov-2020 0335   CALCIUM 8.3 (L) 07/22/2014 0645   GFRNONAA 8 (L) Nov 06, 2020 0335    GFRNONAA 59 (L) 07/22/2014 0645   GFRAA 6 (L) 10/21/2019 1035   GFRAA >60 07/22/2014 0645   Assessment/Planning: Patient is a 70 year old female with a large retroperitoneal hematoma after attempted cardiac catheterization now s/p PEA arrest   1) Intubated. Sedated. Palliative is assisting family. No further CPR as per husband. 2) Defer management to ICU / Cards - no further recommendations from vascular at this time 3) Vascular to sign off  Marcelle Overlie PA-C 2020/11/06 1:31 PM
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
## 2020-11-28 NOTE — Progress Notes (Signed)
Initial Nutrition Assessment  DOCUMENTATION CODES:   Underweight  INTERVENTION:   When appropriate for tube feeds, recommend:  Vital 1.2 @45ml /hr- Initiate at 25ml/hr and increase by 105ml/hr q 12 hours until goal rate is reached.   Free water flushes 49ml q4 hours to maintain tube patency   Regimen provides 1296kcal/day, 81g/day protein and 1061ml/day free water   Liquid MVI daily via tube   Rena-vit daily via tube   Pt at high refeed risk; recommend monitor potassium, magnesium and phosphorus labs daily until stable  NUTRITION DIAGNOSIS:   Inadequate oral intake related to inability to eat (pt sedated and ventilated) as evidenced by NPO status.  GOAL:   Provide needs based on ASPEN/SCCM guidelines  MONITOR:   Vent status, Labs, Weight trends, Skin, I & O's  REASON FOR ASSESSMENT:   Malnutrition Screening Tool    ASSESSMENT:   70 y.o. female with past medical history of CAD, ruptured AAA with repair 2017, COPD, tongue mass with recently diagnosed head and neck cancer, hypertension, hypothyroidism, CHF and ESRD on HD who is admitted with NSTEMI (non-ST elevated myocardial infarction) s/p cardiac cath today complicated by retroperitoneal hematoma  and hypotension requiring emergent vascular intervention with coil embolization of the right external iliac artery and common femoral artery.  Pt s/p PEA arrest; pt now sedated and ventilated. No OGT in place; RN to attempt placement after [place] established. Pt unstable today and is on multiple pressors. Pt is not stable for nutrition support. Per nephrology, pt may need CRRT. Palliative care to meet with family today.   Per chart, pt appears fairly weight stable pta. Highly suspect pt with severe malnutrition but unable to perform NFPE today as family meeting occurring. Will obtain exam at follow-up pending [place] discussions.   Medications reviewed and include: insulin, synthroid, rena-vit, protonix, 5% dextrose @50ml /hr, levophed,  neo-synephrine, zosyn, Na bicarbonate   Labs reviewed: K 5.6(H), BUN 27(H), creat 5.27(H) Wbc- 25.9(H), Hgb 9.7(L), Hct 28.4(L), MCV 109.4(H), MCH 37.3(H) Cbgs- 46, 150, 153, 116 x 24 hrs  Patient is currently intubated on ventilator support MV: 8.1 L/min Temp (24hrs), Avg:97 F (36.1 C), Min:96 F (35.6 C), Max:98.6 F (37 C)  Propofol: none  MAP- >48mmHg   NUTRITION - FOCUSED PHYSICAL EXAM: Unable to perform at this time   Diet Order:   Diet Order     None      EDUCATION NEEDS:   No education needs have been identified at this time  Skin:  Skin Assessment: Reviewed RN Assessment (ecchymosis)  Last BM:  8/7- type 4  Height:   Ht Readings from Last 1 Encounters:  11/24/2020 5' 4.5" (1.638 m)    Weight:   Wt Readings from Last 1 Encounters:  11/03/20 46.6 kg    Ideal Body Weight:  54.5 kg  BMI:  Body mass index is 17.36 kg/m.  Estimated Nutritional Needs:   Kcal:  1158kcal/day  Protein:  75-85g/day  Fluid:  1.2-1.4L/day  Koleen Distance MS, RD, LDN Please refer to [facility] for RD and/or RD on-call/weekend/after hours pager

## 2020-11-28 NOTE — Progress Notes (Signed)
GOALS OF CARE DISCUSSION  The Clinical status was relayed to family in detail. Sister and Husband at bedside  Updated and notified of patients medical condition.    Patient remains unresponsive and will not open eyes to command.   Patient is having a weak cough and struggling to remove secretions.   Patient with increased WOB and using accessory muscles to breathe Explained to family course of therapy and the modalities    Patient with Progressive multiorgan failure with a very high probablity of a very minimal chance of meaningful recovery despite all aggressive and optimal medical therapy.  PATIENT REMAINS DNR status  Family understands the situation.  Patient is in the dying process and is suffering  Family are satisfied with Plan of action and management. All questions answered  Additional CC time 35 mins   Kanae Ignatowski Zarrah Pesa, M.D.  [facility]  Medical Director [place] Director [facility] Cardio-Pulmonary Department

## 2020-11-28 NOTE — Progress Notes (Signed)
Notified by RN that patient was hypoglycemic in the 24s. Currently on NS @125cc /hr. Given ESRD on HD, stopped MIVFs. Started D5W at 40cc/hr.     Tonye Royalty ACNP-BC

## 2020-11-28 NOTE — Progress Notes (Signed)
  Chaplain On-Call responded to notification from Unit Secretary Evon who reported the patient's two sisters and a niece are at the bedside.  Chaplain learned from the family about the patient's history of difficult illnesses, and how they are seeking to cope with this crisis.  Chaplain provided spiritual and emotional support and prayer.  Chaplain Pollyann Samples M.Div., [facility]

## 2020-11-28 NOTE — Progress Notes (Signed)
Patient has been extubated.

## 2020-11-28 NOTE — Progress Notes (Signed)
Patient passed peacefully with family at bedside. Time of death 51. Chaplain to bedside tos peak with family after passing of patient. Will do body preparations after family has spent time with patient. E-link notified of time of death so that they can update [facility].

## 2020-11-28 NOTE — Progress Notes (Signed)
[facility]  ROUNDING NOTE   Subjective:   [name] is a 70 y.o. female with past medical history of CAD, ruptured AAA with repair, COPD, recently diagnosed head and neck cancer, hypertension, hypothyroidism and ESRD on HD. She reports to the ED with complaints of chest pain for the past week. She is being admitted for NSTEMI (non-ST elevated myocardial infarction) ([place]) [I21.4] Angina at rest [facility]) [I20.8]  Patient is known to this practice from previous admission and currently receives dialysis at [place], monitored by [facility]. We have been consulted to manage dialysis needs during this admission.   Patient seen after she returned from the Cath Lab.  She was diagnosed with a retroperitoneal hematoma during attempted cardiac catheterization, hypotension.  Angiogram performed by Dr. Lucky Cowboy with coil embolization of the right external iliac artery and common femoral artery 8/9-patient had cardiac arrest overnight.  Currently on high-dose of pressors and is now intubated.  Requiring Levophed, Neo-Synephrine.  FiO2 100% with PEEP of 8.  D10 supplement to maintain blood sugars.   Objective:  Vital signs in last 24 hours:  Temp:  [96 F (35.6 C)-98.6 F (37 C)] 97.8 F (36.6 C) (08/09 0400) Pulse Rate:  [58-103] 101 (08/09 0400) Resp:  [0-31] 17 (08/09 0600) BP: (64-159)/(24-94) 109/94 (08/09 0600) SpO2:  [90 %-100 %] 90 % (08/09 0400) Arterial Line BP: (86-136)/(48-54) 86/48 (08/09 0600) FiO2 (%):  [100 %] 100 % (08/09 0805)  Weight change:  Filed Weights   11/15/2020 1323 11/03/20 2220  Weight: 49 kg 46.6 kg    Intake/Output: I/O last 3 completed shifts: In: 2320.5 [I.V.:1766; Blood:386; IV Piggyback:168.5] Out: 1 [Emesis/NG output:1]   Intake/Output this shift:  No intake/output data recorded.  Physical Exam: General: Critically ill-appearing, resting in bed  Head: Normocephalic, atraumatic.  Dry oral mucosal membranes, ET tube  Eyes: Anicteric   Lungs:  Bilateral rhonchi, ventilator assisted,  Heart: Irregular  Abdomen:  Soft, nontender  Extremities:  + peripheral edema.  Neurologic: Sedated but able to follow simple commands  Skin: No lesions  Access: Lt AVG-good bruit    Basic Metabolic Panel: Recent Labs  Lab 11/14/2020 1325 11/03/20 0538 11/01/2020 0447 11-07-2020 0150 2020/11/07 0335  NA 136 136 136 137 141  K 3.3* 3.5 3.9 4.8 5.6*  CL 96* 95* 94* 106 103  CO2 28 28 24  11* 12*  GLUCOSE 121* 76 60* 61* 213*  BUN 7* 14 23 24* 27*  CREATININE 2.07* 3.71* 5.36* 5.27* 5.27*  CALCIUM 8.6* 9.3 9.2 7.9* 10.1  PHOS  --   --   --  10.3*  --      Liver Function Tests: Recent Labs  Lab November 07, 2020 0150  ALBUMIN 2.7*   No results for input(s): LIPASE, AMYLASE in the last 168 hours. No results for input(s): AMMONIA in the last 168 hours.  CBC: Recent Labs  Lab 11/03/20 0538 11/12/2020 0447 11/11/2020 1333 11/07/2020 2001 2020/11/07 0150 11/07/2020 0335  WBC 8.4 8.2 23.7*  --  26.5* 25.9*  NEUTROABS  --   --  21.1*  --   --   --   HGB 11.1* 11.3* 7.7* 12.1 10.6* 9.5*  HCT 30.8* 31.9* 22.8* 34.5* 31.6* 27.9*  MCV 109.2* 107.4* 118.1*  --  110.1* 109.4*  PLT 138* 142* 255  --  277 169     Cardiac Enzymes: No results for input(s): CKTOTAL, CKMB, CKMBINDEX, TROPONINI in the last 168 hours.  BNP: Invalid input(s): POCBNP  CBG: Recent Labs  Lab 11/11/2020 1917 11/24/2020 2308 12/02/2020 0239 12-02-20 0258 2020-12-02 0719  GLUCAP 97 88 46* 150* 153*     Microbiology: Results for orders placed or performed during the hospital encounter of 11/16/2020  SARS CORONAVIRUS 2 (TAT 6-24 HRS) Nasopharyngeal Nasopharyngeal Swab     Status: None   Collection Time: 11/01/2020  9:45 PM   Specimen: Nasopharyngeal Swab  Result Value Ref Range Status   SARS Coronavirus 2 NEGATIVE NEGATIVE Final    Comment: (NOTE) SARS-CoV-2 target nucleic acids are NOT DETECTED.  The SARS-CoV-2 RNA is generally detectable in upper and lower respiratory  specimens during the acute phase of infection. Negative results do not preclude SARS-CoV-2 infection, do not rule out co-infections with other pathogens, and should not be used as the sole basis for treatment or other patient management decisions. Negative results must be combined with clinical observations, patient history, and epidemiological information. The expected result is Negative.  Fact Sheet for Patients: SugarRoll.be  Fact Sheet for Healthcare Providers: https://www.woods-mathews.com/  This test is not yet approved or cleared by the [place] FDA and  has been authorized for detection and/or diagnosis of SARS-CoV-2 by FDA under an Emergency Use Authorization (EUA). This EUA will remain  in effect (meaning this test can be used) for the duration of the COVID-19 declaration under Se ction 564(b)(1) of the Act, 21 U.S.C. section 360bbb-3(b)(1), unless the authorization is terminated or revoked sooner.  Performed at [facility], [place] [address] [place], [place] [address]   MRSA Next Gen by PCR, Nasal     Status: None   Collection Time: 11/22/2020  2:34 PM   Specimen: Nasal Mucosa; Nasal Swab  Result Value Ref Range Status   MRSA by PCR Next Gen NOT DETECTED NOT DETECTED Final    Comment: (NOTE) The GeneXpert MRSA Assay (FDA approved for NASAL specimens only), is one component of a comprehensive MRSA colonization surveillance program. It is not intended to diagnose MRSA infection nor to guide or monitor treatment for MRSA infections. Test performance is not FDA approved in patients less than 55 years old. Performed at [facility], [address] [place], [place] [address]   CULTURE, BLOOD (ROUTINE X 2) w Reflex to ID Panel     Status: None (Preliminary result)   Collection Time: 11/11/2020  2:58 PM   Specimen: BLOOD  Result Value Ref Range Status   Specimen Description BLOOD BLOOD RIGHT HAND  Final   Special  Requests   Final    BOTTLES DRAWN AEROBIC AND ANAEROBIC Blood Culture results may not be optimal due to an inadequate volume of blood received in culture bottles   Culture   Final    NO GROWTH < 24 HOURS Performed at [facility], [address] [place], [place] [address]    Report Status PENDING  Incomplete  CULTURE, BLOOD (ROUTINE X 2) w Reflex to ID Panel     Status: None (Preliminary result)   Collection Time: 11/06/2020  3:20 PM   Specimen: BLOOD  Result Value Ref Range Status   Specimen Description BLOOD BLOOD LEFT HAND  Final   Special Requests   Final    BOTTLES DRAWN AEROBIC AND ANAEROBIC Blood Culture results may not be optimal due to an inadequate volume of blood received in culture bottles   Culture   Final    NO GROWTH < 24 HOURS Performed at [facility], [address] [place], [place] [address]    Report Status PENDING  Incomplete    Coagulation Studies: Recent Labs    11/27/2020 1325  LABPROT 13.5  INR 1.0     Urinalysis: No results for input(s): COLORURINE, LABSPEC, PHURINE, GLUCOSEU, HGBUR, BILIRUBINUR, KETONESUR, PROTEINUR, UROBILINOGEN, NITRITE, LEUKOCYTESUR in the last 72 hours.  Invalid input(s): APPERANCEUR    Imaging: CT ABDOMEN PELVIS WO CONTRAST  Result Date: 11/18/2020 CLINICAL DATA:  Follow-up retroperitoneal hematoma. Patient had cardiac catheterization earlier today, as well as coil embolization of the right external iliac artery and common femoral artery. EXAM: CT ABDOMEN AND PELVIS WITHOUT CONTRAST TECHNIQUE: Multidetector CT imaging of the abdomen and pelvis was performed following the standard protocol without IV contrast. COMPARISON:  CT 10/18/2019 FINDINGS: Lower chest: Small to moderate bilateral pleural effusions with compressive atelectasis, new from prior abdominal CT. Normal heart size with coronary artery calcifications. Hepatobiliary: Tiny hypodensities in the liver on prior contrast-enhanced exam are not well seen  currently. Intraluminal gallstones as well as probable sludge. Question of gallbladder wall thickening no biliary dilatation. Pancreas: No ductal dilatation or inflammation. Spleen: Normal in size without focal abnormality. Adrenals/Urinary Tract: No suspicious adrenal nodule. Chronic renal parenchymal atrophy. Decompressed urinary bladder. Stomach/Bowel: Small hiatal hernia. Mild stranding adjacent to the duodenum is felt to be due to large right retroperitoneal hematoma rather than duodenal inflammation. There is no small bowel obstruction or inflammation. Cecum located in the deep pelvis. No colonic wall thickening or pericolonic edema. Vascular/Lymphatic: There is a large right retroperitoneal hematoma containing contrast from prior procedure, no contrast was administered with CT. Hematoma extends from the right external iliac vessels and courses posterior to the liver edge to the diaphragm. Due to large size, size estimates is approximate, however dominant hematoma measures 9.7 x 8.6 x 16.7 cm. Coil embolization noted in the region of the right external iliac artery there is associated streak artifact. Prior repair of the right common femoral artery. Aortic stent graft in place. Fem-fem bypass graft is in place. No obvious abdominopelvic adenopathy. Reproductive: Hysterectomy.  No adnexal mass. Other: Small amount of blood tracks adjacent to the liver in the right upper quadrant. Small amount of blood tracks into the pelvis. There is no free air. Postsurgical change in the right inguinal region from recent intervention. Musculoskeletal: Chronic T11 compression fracture. Chronic anterolisthesis of L5 on S1 related to pars defects. Diffuse bony under mineralization. Prior sacral plasty. IMPRESSION: 1. Large right retroperitoneal hematoma containing contrast from prior procedure, no contrast was administered with current CT. Hematoma extends from the right external iliac vessels and courses posterior to the liver  edge to the diaphragm. Due to large size, size is approximate, however dominant hematoma measures 9.7 x 8.6 x 16.7 cm. 2. Small amount of hemorrhage in the right upper quadrant and tracking into the pelvis. 3. Small to moderate bilateral pleural effusions with compressive atelectasis, new from prior abdominal CT. 4. Cholelithiasis and probable sludge. Question of gallbladder wall thickening, presumably reactive. Aortic Atherosclerosis (ICD10-I70.0). Electronically Signed   By: Keith Rake M.D.   On: 10/30/2020 20:40   CARDIAC CATHETERIZATION  Result Date: 11/22/2020 See surgical note for result.  PERIPHERAL VASCULAR CATHETERIZATION  Result Date: 11/12/2020 See surgical note for result.  DG Chest Port 1 View  Result Date: 12/01/2020 CLINICAL DATA:  70 year old female intubated.  Angina, NSTEMI. EXAM: PORTABLE CHEST 1 VIEW COMPARISON:  Portable chest 10/28/2020 and earlier. FINDINGS: Portable AP semi upright view at 0412 hours. Intubated. Endotracheal tube tip in good position between the clavicles and carina. Pacer or resuscitation pads now  project over the chest. Stable left axillary vascular stents. Partially visible chronic abdominal aortic endograft in the abdomen. Stable lung volumes. Stable cardiac size and mediastinal contours. Increased patchy and veiling opacities in both lower lungs since yesterday. Mildly increased background interstitial opacity. No pneumothorax. No air bronchograms. IMPRESSION: 1. Endotracheal tube tip in good position between the clavicles and carina. 2. Increased patchy, interstitial, and veiling opacities in both lower lungs since yesterday. Differential considerations include pulmonary edema with atelectasis versus bilateral infection plus small pleural effusions. Electronically Signed   By: Genevie Ann M.D.   On: Nov 24, 2020 05:05   DG Chest Port 1 View  Result Date: 11/19/2020 CLINICAL DATA:  Angina at rest. Chest pain. Status post catheterization for non ST segment  elevation MI. EXAM: PORTABLE CHEST 1 VIEW COMPARISON:  11/24/2020 FINDINGS: Normal heart size. No pleural effusion identified. Mild diffuse interlobular septal thickening is identified compatible with pulmonary edema. No airspace opacification. Left upper extremity venous stents noted. IMPRESSION: Mild pulmonary edema. Electronically Signed   By: Kerby Moors M.D.   On: 11/12/2020 14:35     Medications:    sodium chloride     dextrose 50 mL/hr at 11/24/2020 0245   fentaNYL infusion INTRAVENOUS Stopped (11/24/20 0600)   norepinephrine (LEVOPHED) Adult infusion 25 mcg/min (2020-11-24 0749)   phenylephrine (NEO-SYNEPHRINE) Adult infusion 200 mcg/min (2020/11/24 0836)   piperacillin-tazobactam (ZOSYN)  IV 2.25 g (11/24/20 0509)    sodium bicarbonate (isotonic) infusion in sterile water 125 mL/hr at November 24, 2020 8366    atorvastatin  40 mg Oral Daily   chlorhexidine gluconate (MEDLINE KIT)  15 mL Mouth Rinse BID   Chlorhexidine Gluconate Cloth  6 each Topical Daily   insulin aspart  0-6 Units Subcutaneous Q4H   levothyroxine  100 mcg Oral QAC breakfast   lidocaine  2 patch Transdermal Q24H   mouth rinse  15 mL Mouth Rinse 10 times per day   multivitamin  1 tablet Oral QHS   pantoprazole  40 mg Oral QHS   sodium chloride flush  3 mL Intravenous Q12H   acetaminophen, cyclobenzaprine, docusate sodium, fentaNYL (SUBLIMAZE) injection, gabapentin, lidocaine-prilocaine, nitroGLYCERIN, ondansetron (ZOFRAN) IV  Assessment/ Plan:  Ms. BEONKA AMESQUITA is a 70 y.o.  female with past medical history of CAD, ruptured AAA with repair, COPD, recently diagnosed head and neck cancer, hypertension, hypothyroidism and ESRD on HD. She reports to the ED with complaints of chest pain for the past week. She is being admitted for NSTEMI (non-ST elevated myocardial infarction) ([place]) [I21.4] Angina at rest [facility]) [I20.8]   [facility] [address]/TTS/Lt AVG  End stage renal disease on dialysis Patient hemodynamically is  unstable for intermittent hemodialysis.  Given severe acidosis, hyperphosphatemia and elevated BUN, recommend CRRT -It can be started after dialysis access is available which may be difficult given previous h/o vasculopathy  2. Anemia of chronic kidney disease  and blood loss Lab Results  Component Value Date   HGB 9.5 (L) 2020/11/24  Will initiate Epogen with hemodialysis Given blood transfusion on 8/8 after significant drop in hemoglobin was noted from 11.3-7.7  3. Secondary Hyperparathyroidism:  Lab Results  Component Value Date   PTH 31 06/18/2015   CALCIUM 10.1 Nov 24, 2020   CAION 1.20 03/21/2019   PHOS 10.3 (H) November 24, 2020  Phosphorus expected to improve once dialysis can be reinitiated   4. Chest pain, NSTEMI Cardiac cath could not be completed due to right femoral aneurysm.  Status post coiling on 10/30/2020 by Dr Lucky Cowboy Cardiology following  5.  Acute respiratory failure 8/9-ventilator support    LOS: 3 Reba Hulett Aug 11, 20228:47 AM

## 2020-11-28 NOTE — Progress Notes (Signed)
Patient complaining of lower abdominal and back pain. Receiving PRN fentanyl with no relief and requesting additional pain medication. Morphine 2mg  IV x1 ordered.     Tonye Royalty ACNP-BC

## 2020-11-28 NOTE — Progress Notes (Signed)
   12/01/2020 1406  Clinical Encounter Type  Visited With Patient and family together  Visit Type Initial;Spiritual support;Social support;Critical Care;Death  Referral From Chaplain  Consult/Referral To [place] responded to a request by Carman Ching to provide support for an end-of-life. When the [facility] arrived, PT had passed. Chaplain found husband, PT's sibling, and niece at bedside. Chaplain offered Emotional and Spiritual support. Family spoke of the patient's health history over the years, and "how she cheated death before." They also spoke of the wonderful person she was and her deep faith in God. Storytelling, hospitality, prayer, and compassionate listening were all components of this visit.

## 2020-11-28 NOTE — Progress Notes (Signed)
*  PRELIMINARY RESULTS* Echocardiogram 2D Echocardiogram has been performed.  [name] [name] 2020-11-21, 8:35 AM

## 2020-11-28 NOTE — Death Summary Note (Addendum)
DEATH SUMMARY   Patient Details  Name: [name] MRN: [id number] DOB: 08/03/1950  Admission/Discharge Information   Admit Date:  11/30/20  Date of Death: Date of Death: 12/03/20  Time of Death: Time of Death: July 17, 1409  Length of Stay: 3  Referring Physician: Baxter Hire, MD   Reason(s) for Hospitalization  70 y.o. female admitted with Acute Coronary Syndrome.   Underwent Cardiac Cath on 11/15/2020 and upon femoral access was found to have large right femoral and/or iliac aneurysm with retroperitoneal bleed.   Cath was aborted and Vascular Surgery performed coil embolization of the right external iliac artery & common femoral artery.  Post-op with Hemorrhagic shock.    Diagnoses  Preliminary cause of death: Ischemic cardiomyopathy Secondary Diagnoses (including complications and co-morbidities):  Principal Problem:   NSTEMI (non-ST elevated myocardial infarction) ([place]) Active Problems:   Hypothyroidism   HIT (heparin-induced thrombocytopenia) (HCC)   ESRD (end stage renal disease) ([place])   Chronic respiratory failure with hypoxia (HCC)   Chronic obstructive pulmonary disease, unspecified (HCC)   Chronic diastolic CHF (congestive heart failure) ([place])   Angina at rest [facility])   Brief Hospital Course (including significant findings, care, treatment, and services provided and events leading to death)   70 y.o. female admitted with Acute Coronary Syndrome.   Underwent Cardiac Cath on 11/18/2020 and upon femoral access was found to have large right femoral and/or iliac aneurysm with retroperitoneal bleed.   Cath was aborted and Vascular Surgery performed coil embolization of the right external iliac artery & common femoral artery.  Post-op with Hemorrhagic shock.   Hospital Course: On 11/22/2020 she was taken for cardiac catheterization.  However upon femoral access was found to have large right femoral and/or iliac aneurysm with retroperitoneal bleed.  Cath was aborted and Vascular  Surgery performed coil embolization of the right external iliac artery & common femoral artery.  Post-op with Hemorrhagic shock requiring vasopressors and blood transfusion.   She is being transferred to ICU.  PCCM is consulted for further assistance with management of shock.  Pertinent  Medical History  Ruptured AAA status postrepair Coronary artery disease End-stage renal disease on hemodialysis (TTS) COPD Recently diagnosed head neck cancer Hypertension Hypothyroidism Heparin-induced thrombocytopenia   Micro Data:  Nov 30, 2020: SARS-CoV-2>> negative 11/02/2020: Blood culture x2>>   Antimicrobials:  Zosyn 8/8>>   Significant Hospital Events: Including procedures, antibiotic start and stop dates in addition to other pertinent events  November 30, 2020: Presented to ED with chest pain, to be admitted by the hospitalist 11/03/2020: Cardiology consulted 10/31/2020: Cardiac cath aborted due to large right femoral and/or iliac aneurysm with retrosternal bleed requiring coil immunization by vascular surgery.  Postop with hemorrhagic shock.  Transferred to ICU with PCCM consultation 2022/12/04 cardiac arrest, CODE BLUE-severe acidosis    The Clinical status was relayed to family in detail. Husband and sisters at bedside   Updated and notified of patients medical condition.    Patient remains unresponsive and will not open eyes to command.   Patient is having a weak cough and struggling to remove secretions.   Patient with increased WOB and using accessory muscles to breathe Explained to family course of therapy and the modalities      Patient with Progressive multiorgan failure with a very high probablity of a very minimal chance of meaningful recovery despite all aggressive and optimal medical therapy.     Family understands the situation.   They have consented and agreed to DNR/DNI and would like to proceed with Comfort  care measures.   Family are satisfied with Plan of action and management. All  questions answered     Pertinent Labs and Studies  Significant Diagnostic Studies CT ABDOMEN PELVIS WO CONTRAST  Result Date: 11/03/2020 CLINICAL DATA:  Follow-up retroperitoneal hematoma. Patient had cardiac catheterization earlier today, as well as coil embolization of the right external iliac artery and common femoral artery. EXAM: CT ABDOMEN AND PELVIS WITHOUT CONTRAST TECHNIQUE: Multidetector CT imaging of the abdomen and pelvis was performed following the standard protocol without IV contrast. COMPARISON:  CT 10/18/2019 FINDINGS: Lower chest: Small to moderate bilateral pleural effusions with compressive atelectasis, new from prior abdominal CT. Normal heart size with coronary artery calcifications. Hepatobiliary: Tiny hypodensities in the liver on prior contrast-enhanced exam are not well seen currently. Intraluminal gallstones as well as probable sludge. Question of gallbladder wall thickening no biliary dilatation. Pancreas: No ductal dilatation or inflammation. Spleen: Normal in size without focal abnormality. Adrenals/Urinary Tract: No suspicious adrenal nodule. Chronic renal parenchymal atrophy. Decompressed urinary bladder. Stomach/Bowel: Small hiatal hernia. Mild stranding adjacent to the duodenum is felt to be due to large right retroperitoneal hematoma rather than duodenal inflammation. There is no small bowel obstruction or inflammation. Cecum located in the deep pelvis. No colonic wall thickening or pericolonic edema. Vascular/Lymphatic: There is a large right retroperitoneal hematoma containing contrast from prior procedure, no contrast was administered with CT. Hematoma extends from the right external iliac vessels and courses posterior to the liver edge to the diaphragm. Due to large size, size estimates is approximate, however dominant hematoma measures 9.7 x 8.6 x 16.7 cm. Coil embolization noted in the region of the right external iliac artery there is associated streak artifact.  Prior repair of the right common femoral artery. Aortic stent graft in place. Fem-fem bypass graft is in place. No obvious abdominopelvic adenopathy. Reproductive: Hysterectomy.  No adnexal mass. Other: Small amount of blood tracks adjacent to the liver in the right upper quadrant. Small amount of blood tracks into the pelvis. There is no free air. Postsurgical change in the right inguinal region from recent intervention. Musculoskeletal: Chronic T11 compression fracture. Chronic anterolisthesis of L5 on S1 related to pars defects. Diffuse bony under mineralization. Prior sacral plasty. IMPRESSION: 1. Large right retroperitoneal hematoma containing contrast from prior procedure, no contrast was administered with current CT. Hematoma extends from the right external iliac vessels and courses posterior to the liver edge to the diaphragm. Due to large size, size is approximate, however dominant hematoma measures 9.7 x 8.6 x 16.7 cm. 2. Small amount of hemorrhage in the right upper quadrant and tracking into the pelvis. 3. Small to moderate bilateral pleural effusions with compressive atelectasis, new from prior abdominal CT. 4. Cholelithiasis and probable sludge. Question of gallbladder wall thickening, presumably reactive. Aortic Atherosclerosis (ICD10-I70.0). Electronically Signed   By: Keith Rake M.D.   On: 11/04/2020 20:40   DG Chest 2 View  Result Date: 11/09/2020 CLINICAL DATA:  70 year old female with chest pain. EXAM: CHEST - 2 VIEW COMPARISON:  12/21/2016 FINDINGS: The mediastinal contours are within normal limits. No cardiomegaly. Atherosclerotic calcification of the aortic arch. Similar appearing emphysematous changes. The lungs are clear bilaterally without evidence of focal consolidation, pleural effusion, or pneumothorax. Left upper extremity venous stents in place. Partially visualized abdominal aortic EVAR stent graft. Partially visualized anterior wedge compression deformity of a lower thoracic  vertebral, unchanged no acute osseous abnormality. IMPRESSION: No acute cardiopulmonary process. Aortic Atherosclerosis (ICD10-I70.0) and Emphysema (ICD10-J43.9). Electronically Signed   By:  Ruthann Cancer MD   On: 11/21/2020 14:09   CARDIAC CATHETERIZATION  Result Date: 11/10/2020 See surgical note for result.  PERIPHERAL VASCULAR CATHETERIZATION  Result Date: 11/16/2020 See surgical note for result.  DG Chest Port 1 View  Result Date: 12-Nov-2020 CLINICAL DATA:  70 year old female intubated.  Angina, NSTEMI. EXAM: PORTABLE CHEST 1 VIEW COMPARISON:  Portable chest 11/18/2020 and earlier. FINDINGS: Portable AP semi upright view at 0412 hours. Intubated. Endotracheal tube tip in good position between the clavicles and carina. Pacer or resuscitation pads now project over the chest. Stable left axillary vascular stents. Partially visible chronic abdominal aortic endograft in the abdomen. Stable lung volumes. Stable cardiac size and mediastinal contours. Increased patchy and veiling opacities in both lower lungs since yesterday. Mildly increased background interstitial opacity. No pneumothorax. No air bronchograms. IMPRESSION: 1. Endotracheal tube tip in good position between the clavicles and carina. 2. Increased patchy, interstitial, and veiling opacities in both lower lungs since yesterday. Differential considerations include pulmonary edema with atelectasis versus bilateral infection plus small pleural effusions. Electronically Signed   By: Genevie Ann M.D.   On: 11-12-2020 05:05   DG Chest Port 1 View  Result Date: 11/03/2020 CLINICAL DATA:  Angina at rest. Chest pain. Status post catheterization for non ST segment elevation MI. EXAM: PORTABLE CHEST 1 VIEW COMPARISON:  11/14/2020 FINDINGS: Normal heart size. No pleural effusion identified. Mild diffuse interlobular septal thickening is identified compatible with pulmonary edema. No airspace opacification. Left upper extremity venous stents noted. IMPRESSION:  Mild pulmonary edema. Electronically Signed   By: Kerby Moors M.D.   On: 11/04/2020 14:35   ECHOCARDIOGRAM COMPLETE  Result Date: 12-Nov-2020    ECHOCARDIOGRAM REPORT   Patient Name:   [name] Date of Exam: 2020-11-12 Medical Rec #:  [id number]         Height:       64.5 in Accession #:    [id number]        Weight:       102.7 lb Date of Birth:  [birth year]        BSA:          1.482 m Patient Age:    67 years          BP:           116/69 mmHg Patient Gender: F                 HR:           118 bpm. Exam Location:  [facility] Procedure: 2D Echo, Color Doppler, Cardiac Doppler and Strain Analysis Indications:     I21.4 NSTEMI  History:         Patient has prior history of Echocardiogram examinations. CHF,                  COPD and ESRD; Risk Factors:Hypertension and Dyslipidemia.  Sonographer:     Charmayne Sheer RDCS (AE) Referring Phys:  [id number] OMVEHMCN AGBATA Diagnosing Phys: Bartholome Bill MD  Sonographer Comments: Suboptimal parasternal window and echo performed with patient supine and on artificial respirator. Image acquisition challenging due to COPD. Global longitudinal strain was attempted. IMPRESSIONS  1. Left ventricular ejection fraction, by estimation, is 25 to 30%. The left ventricle has severely decreased function. The left ventricle demonstrates regional wall motion abnormalities (see scoring diagram/findings for description). There is moderate left ventricular hypertrophy. Left ventricular diastolic parameters are consistent with Grade I diastolic dysfunction (impaired relaxation).  2. Right ventricular systolic  function is normal. The right ventricular size is normal.  3. The mitral valve is grossly normal. Mild to moderate mitral valve regurgitation.  4. The aortic valve is calcified. Aortic valve regurgitation is mild to moderate. Mild to moderate aortic valve sclerosis/calcification is present, without any evidence of aortic stenosis. FINDINGS  Left Ventricle: Left ventricular ejection fraction,  by estimation, is 25 to 30%. The left ventricle has severely decreased function. The left ventricle demonstrates regional wall motion abnormalities. The left ventricular internal cavity size was normal  in size. There is moderate left ventricular hypertrophy. Left ventricular diastolic parameters are consistent with Grade I diastolic dysfunction (impaired relaxation).  LV Wall Scoring: The anterior septum and posterior wall are hypokinetic. Right Ventricle: The right ventricular size is normal. No increase in right ventricular wall thickness. Right ventricular systolic function is normal. Left Atrium: Left atrial size was normal in size. Right Atrium: Right atrial size was normal in size. Pericardium: There is no evidence of pericardial effusion. Mitral Valve: The mitral valve is grossly normal. Mild to moderate mitral valve regurgitation. MV peak gradient, 7.7 mmHg. The mean mitral valve gradient is 4.0 mmHg. Tricuspid Valve: The tricuspid valve is not well visualized. Tricuspid valve regurgitation is trivial. Aortic Valve: The aortic valve is calcified. Aortic valve regurgitation is mild to moderate. Aortic regurgitation PHT measures 172 msec. Mild to moderate aortic valve sclerosis/calcification is present, without any evidence of aortic stenosis. Aortic valve mean gradient measures 8.0 mmHg. Aortic valve peak gradient measures 16.6 mmHg. Aortic valve area, by VTI measures 1.24 cm. Pulmonic Valve: The pulmonic valve was not well visualized. Pulmonic valve regurgitation is trivial. Aorta: The aortic root is normal in size and structure. IAS/Shunts: The interatrial septum was not assessed.  LEFT VENTRICLE PLAX 2D LVIDd:         4.00 cm  Diastology LVIDs:         2.90 cm  LV e' medial:    5.11 cm/s LV PW:         0.90 cm  LV E/e' medial:  20.4 LV IVS:        0.80 cm  LV e' lateral:   5.11 cm/s LVOT diam:     2.10 cm  LV E/e' lateral: 20.4 LV SV:         38 LV SV Index:   26 LVOT Area:     3.46 cm  RIGHT VENTRICLE  RV Basal diam:  2.40 cm LEFT ATRIUM             Index       RIGHT ATRIUM          Index LA diam:        3.90 cm 2.63 cm/m  RA Area:     7.24 cm LA Vol (A2C):   28.0 ml 18.90 ml/m RA Volume:   14.60 ml 9.85 ml/m LA Vol (A4C):   21.0 ml 14.17 ml/m LA Biplane Vol: 27.0 ml 18.22 ml/m  AORTIC VALVE                    PULMONIC VALVE AV Area (Vmax):    1.21 cm     PV Vmax:       0.64 m/s AV Area (Vmean):   1.27 cm     PV Vmean:      44.200 cm/s AV Area (VTI):     1.24 cm     PV VTI:        0.061 m AV  Vmax:           204.00 cm/s  PV Peak grad:  1.6 mmHg AV Vmean:          132.000 cm/s PV Mean grad:  1.0 mmHg AV VTI:            0.308 m AV Peak Grad:      16.6 mmHg AV Mean Grad:      8.0 mmHg LVOT Vmax:         71.10 cm/s LVOT Vmean:        48.500 cm/s LVOT VTI:          0.110 m LVOT/AV VTI ratio: 0.36 AI PHT:            172 msec  AORTA Ao Root diam: 3.20 cm MITRAL VALVE MV Area (PHT): 4.85 cm     SHUNTS MV Area VTI:   1.71 cm     Systemic VTI:  0.11 m MV Peak grad:  7.7 mmHg     Systemic Diam: 2.10 cm MV Mean grad:  4.0 mmHg MV Vmax:       1.39 m/s MV Vmean:      88.6 cm/s MV Decel Time: 156 msec MV E velocity: 104.33 cm/s Bartholome Bill MD Electronically signed by Bartholome Bill MD Signature Date/Time: 11/20/2020/9:30:06 AM    Final     Microbiology Recent Results (from the past 240 hour(s))  SARS CORONAVIRUS 2 (TAT 6-24 HRS) Nasopharyngeal Nasopharyngeal Swab     Status: None   Collection Time: 11/25/2020  9:45 PM   Specimen: Nasopharyngeal Swab  Result Value Ref Range Status   SARS Coronavirus 2 NEGATIVE NEGATIVE Final    Comment: (NOTE) SARS-CoV-2 target nucleic acids are NOT DETECTED.  The SARS-CoV-2 RNA is generally detectable in upper and lower respiratory specimens during the acute phase of infection. Negative results do not preclude SARS-CoV-2 infection, do not rule out co-infections with other pathogens, and should not be used as the sole basis for treatment or other patient management  decisions. Negative results must be combined with clinical observations, patient history, and epidemiological information. The expected result is Negative.  Fact Sheet for Patients: SugarRoll.be  Fact Sheet for Healthcare Providers: https://www.woods-mathews.com/  This test is not yet approved or cleared by the [place] FDA and  has been authorized for detection and/or diagnosis of SARS-CoV-2 by FDA under an Emergency Use Authorization (EUA). This EUA will remain  in effect (meaning this test can be used) for the duration of the COVID-19 declaration under Se ction 564(b)(1) of the Act, 21 U.S.C. section 360bbb-3(b)(1), unless the authorization is terminated or revoked sooner.  Performed at [facility], [place] [address], [place], [place] [address]   MRSA Next Gen by PCR, Nasal     Status: None   Collection Time: 11/08/2020  2:34 PM   Specimen: Nasal Mucosa; Nasal Swab  Result Value Ref Range Status   MRSA by PCR Next Gen NOT DETECTED NOT DETECTED Final    Comment: (NOTE) The GeneXpert MRSA Assay (FDA approved for NASAL specimens only), is one component of a comprehensive MRSA colonization surveillance program. It is not intended to diagnose MRSA infection nor to guide or monitor treatment for MRSA infections. Test performance is not FDA approved in patients less than 82 years old. Performed at [facility], [place], [place] [address]   CULTURE, BLOOD (ROUTINE X 2) w Reflex to ID Panel     Status: None (Preliminary result)  Collection Time: 10/31/2020  2:58 PM   Specimen: BLOOD  Result Value Ref Range Status   Specimen Description BLOOD BLOOD RIGHT HAND  Final   Special Requests   Final    BOTTLES DRAWN AEROBIC AND ANAEROBIC Blood Culture results may not be optimal due to an inadequate volume of blood received in culture bottles   Culture   Final    NO GROWTH < 24 HOURS Performed at [facility], [address] [place], [place] [address]    Report Status PENDING  Incomplete  CULTURE, BLOOD (ROUTINE X 2) w Reflex to ID Panel     Status: None (Preliminary result)   Collection Time: 11/10/2020  3:20 PM   Specimen: BLOOD  Result Value Ref Range Status   Specimen Description BLOOD BLOOD LEFT HAND  Final   Special Requests   Final    BOTTLES DRAWN AEROBIC AND ANAEROBIC Blood Culture results may not be optimal due to an inadequate volume of blood received in culture bottles   Culture   Final    NO GROWTH < 24 HOURS Performed at [facility], [address] [place], [place] [address]    Report Status PENDING  Incomplete    Lab Basic Metabolic Panel: Recent Labs  Lab 10/28/2020 1325 11/03/20 0538 11/07/2020 0447 11/21/20 0150 11/21/20 0335  NA 136 136 136 137 141  K 3.3* 3.5 3.9 4.8 5.6*  CL 96* 95* 94* 106 103  CO2 28 28 24  11* 12*  GLUCOSE 121* 76 60* 61* 213*  BUN 7* 14 23 24* 27*  CREATININE 2.07* 3.71* 5.36* 5.27* 5.27*  CALCIUM 8.6* 9.3 9.2 7.9* 10.1  PHOS  --   --   --  10.3*  --    Liver Function Tests: Recent Labs  Lab 11-21-2020 0150  ALBUMIN 2.7*   No results for input(s): LIPASE, AMYLASE in the last 168 hours. No results for input(s): AMMONIA in the last 168 hours. CBC: Recent Labs  Lab 11/03/20 0538 11/03/2020 0447 11/21/2020 1333 11/22/2020 2001 21-Nov-2020 0150 11-21-2020 0335 Nov 21, 2020 1012  WBC 8.4 8.2 23.7*  --  26.5* 25.9*  --   NEUTROABS  --   --  21.1*  --   --   --   --   HGB 11.1* 11.3* 7.7* 12.1 10.6* 9.5* 9.7*  HCT 30.8* 31.9* 22.8* 34.5* 31.6* 27.9* 28.4*  MCV 109.2* 107.4* 118.1*  --  110.1* 109.4*  --   PLT 138* 142* 255  --  277 169  --    Cardiac Enzymes: No results for input(s): CKTOTAL, CKMB, CKMBINDEX, TROPONINI in the last 168 hours. Sepsis Labs: Recent Labs  Lab 11/27/2020 0447 11/14/2020 1333 10/30/2020 2001 11/02/2020 2247 11-21-2020 0150 11/21/2020 0335 Nov 21, 2020 1012  PROCALCITON 0.64  --   --   --  2.27  --   --   WBC  8.2 23.7*  --   --  26.5* 25.9*  --   LATICACIDVEN  --   --  5.4* 10.7*  --   --  >11.0*      Bryler Dibble 2020-11-21, 2:23 PM

## 2020-11-28 NOTE — Progress Notes (Signed)
  Chaplain On-Call received referral from overnight Chaplain Martyn Ehrich who had been present during the Code Blue event and offered support for the patient's husband [place].  [place] at bedside, while the patient is on ventilator support.  Chaplain provided spiritual and emotional support for [place] as he stated his concerns for the patient's grave condition.  Chaplains are available for additional support as needed.  Chaplain Pollyann Samples M.Div., [facility]

## 2020-11-28 NOTE — Progress Notes (Signed)
Chaplain Maggie made initial visit to offer support after patient's death. The funeral release form was completed and condolences shared.

## 2020-11-28 NOTE — Progress Notes (Signed)
CBG 46 at 2:36: Informed NP

## 2020-11-28 NOTE — Progress Notes (Signed)
GOALS OF CARE DISCUSSION  The Clinical status was relayed to family in detail. Husband and sisters at bedside  Updated and notified of patients medical condition.    Patient remains unresponsive and will not open eyes to command.   Patient is having a weak cough and struggling to remove secretions.   Patient with increased WOB and using accessory muscles to breathe Explained to family course of therapy and the modalities    Patient with Progressive multiorgan failure with a very high probablity of a very minimal chance of meaningful recovery despite all aggressive and optimal medical therapy.    Family understands the situation.  They have consented and agreed to DNR/DNI and would like to proceed with Comfort care measures.  Family are satisfied with Plan of action and management. All questions answered  Additional CC time 20 mins   [name], M.D.  [facility]  Medical Director [place] Director [facility] Cardio-Pulmonary Department

## 2020-11-28 NOTE — Procedures (Signed)
Arterial Catheter Insertion Procedure Note  [name]  [id number]  Apr 20, 1950  Date:13-Nov-2020  Time:6:49 AM    Provider Performing: Tonye Royalty    Procedure: Insertion of Arterial Line [phone]) with US guidance ([id number])   Indication(s) Blood pressure monitoring and/or need for frequent ABGs  Consent Unable to obtain consent due to emergent nature of procedure.  Anesthesia None   Time Out Verified patient identification, verified procedure, site/side was marked, verified correct patient position, special equipment/implants available, medications/allergies/relevant history reviewed, required imaging and test results available.   Sterile Technique Maximal sterile technique including full sterile barrier drape, hand hygiene, sterile gown, sterile gloves, mask, hair covering, sterile ultrasound probe cover (if used).   Procedure Description Area of catheter insertion was cleaned with chlorhexidine and draped in sterile fashion. With real-time ultrasound guidance an arterial catheter was placed into the right radial artery.  Appropriate arterial tracings confirmed on monitor.     Complications/Tolerance None; patient tolerated the procedure well.   EBL Minimal   Specimen(s) None

## 2020-11-28 DEATH — deceased

## 2021-02-10 ENCOUNTER — Encounter (INDEPENDENT_AMBULATORY_CARE_PROVIDER_SITE_OTHER): Payer: Medicare Other

## 2021-02-10 ENCOUNTER — Ambulatory Visit (INDEPENDENT_AMBULATORY_CARE_PROVIDER_SITE_OTHER): Payer: Medicare Other | Admitting: Vascular Surgery
